# Patient Record
Sex: Female | Born: 1990 | Race: Black or African American | Hispanic: No | Marital: Married | Smoking: Never smoker
Health system: Southern US, Community
[De-identification: ages and names within clinical notes are randomized; demographics above are authoritative.]

## PROBLEM LIST (undated history)

## (undated) ENCOUNTER — Inpatient Hospital Stay (HOSPITAL_COMMUNITY): Payer: Self-pay

## (undated) DIAGNOSIS — R569 Unspecified convulsions: Secondary | ICD-10-CM

## (undated) DIAGNOSIS — Z9981 Dependence on supplemental oxygen: Secondary | ICD-10-CM

## (undated) DIAGNOSIS — C801 Malignant (primary) neoplasm, unspecified: Secondary | ICD-10-CM

## (undated) DIAGNOSIS — G40909 Epilepsy, unspecified, not intractable, without status epilepticus: Secondary | ICD-10-CM

## (undated) DIAGNOSIS — T4145XA Adverse effect of unspecified anesthetic, initial encounter: Secondary | ICD-10-CM

## (undated) DIAGNOSIS — K859 Acute pancreatitis without necrosis or infection, unspecified: Secondary | ICD-10-CM

## (undated) DIAGNOSIS — R011 Cardiac murmur, unspecified: Secondary | ICD-10-CM

## (undated) DIAGNOSIS — J45909 Unspecified asthma, uncomplicated: Secondary | ICD-10-CM

## (undated) DIAGNOSIS — O2230 Deep phlebothrombosis in pregnancy, unspecified trimester: Secondary | ICD-10-CM

## (undated) DIAGNOSIS — D573 Sickle-cell trait: Secondary | ICD-10-CM

## (undated) DIAGNOSIS — T8859XA Other complications of anesthesia, initial encounter: Secondary | ICD-10-CM

## (undated) DIAGNOSIS — G4730 Sleep apnea, unspecified: Secondary | ICD-10-CM

## (undated) DIAGNOSIS — K219 Gastro-esophageal reflux disease without esophagitis: Secondary | ICD-10-CM

## (undated) HISTORY — PX: INGUINAL HERNIA REPAIR: SUR1180

## (undated) HISTORY — PX: TONSILLECTOMY: SUR1361

## (undated) HISTORY — DX: Malignant (primary) neoplasm, unspecified: C80.1

## (undated) HISTORY — PX: APPENDECTOMY: SHX54

---
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
## 2012-09-23 ENCOUNTER — Emergency Department (HOSPITAL_COMMUNITY): Payer: Self-pay

## 2012-09-23 ENCOUNTER — Inpatient Hospital Stay: Admit: 2012-09-23 | Payer: Self-pay | Admitting: Orthopedic Surgery

## 2012-09-23 ENCOUNTER — Encounter (HOSPITAL_COMMUNITY)
Admission: EM | Disposition: A | Discharge status: Home / Self Care | Payer: Self-pay | Source: Home / Self Care | Attending: Emergency Medicine

## 2012-09-23 ENCOUNTER — Encounter (HOSPITAL_COMMUNITY): Payer: Self-pay | Admitting: Emergency Medicine

## 2012-09-23 ENCOUNTER — Emergency Department (HOSPITAL_COMMUNITY): Payer: Self-pay | Admitting: *Deleted

## 2012-09-23 ENCOUNTER — Encounter (HOSPITAL_COMMUNITY): Payer: Self-pay | Admitting: *Deleted

## 2012-09-23 ENCOUNTER — Observation Stay (HOSPITAL_COMMUNITY)
Admission: EM | Admit: 2012-09-23 | Discharge: 2012-09-24 | Disposition: A | Discharge status: Home / Self Care | Payer: Self-pay | Attending: Emergency Medicine | Admitting: Emergency Medicine

## 2012-09-23 DIAGNOSIS — S61411A Laceration without foreign body of right hand, initial encounter: Secondary | ICD-10-CM

## 2012-09-23 DIAGNOSIS — S61409A Unspecified open wound of unspecified hand, initial encounter: Principal | ICD-10-CM | POA: Insufficient documentation

## 2012-09-23 DIAGNOSIS — W268XXA Contact with other sharp object(s), not elsewhere classified, initial encounter: Secondary | ICD-10-CM | POA: Insufficient documentation

## 2012-09-23 DIAGNOSIS — IMO0002 Reserved for concepts with insufficient information to code with codable children: Secondary | ICD-10-CM | POA: Insufficient documentation

## 2012-09-23 HISTORY — DX: Unspecified asthma, uncomplicated: J45.909

## 2012-09-23 HISTORY — PX: NERVE, TENDON AND ARTERY REPAIR: SHX5695

## 2012-09-23 HISTORY — DX: Deep phlebothrombosis in pregnancy, unspecified trimester: O22.30

## 2012-09-23 LAB — CBC WITH DIFFERENTIAL/PLATELET
Basophils Relative: 1 % (ref 0–1)
Eosinophils Relative: 1 % (ref 0–5)
HCT: 39 % (ref 36.0–46.0)
Hemoglobin: 13 g/dL (ref 12.0–15.0)
Lymphocytes Relative: 41 % (ref 12–46)
MCH: 27.9 pg (ref 26.0–34.0)
Neutro Abs: 4.4 10*3/uL (ref 1.7–7.7)
Neutrophils Relative %: 50 % (ref 43–77)
RBC: 4.66 MIL/uL (ref 3.87–5.11)

## 2012-09-23 LAB — BASIC METABOLIC PANEL
BUN: 3 mg/dL — ABNORMAL LOW (ref 6–23)
CO2: 23 mEq/L (ref 19–32)
Glucose, Bld: 76 mg/dL (ref 70–99)
Potassium: 3.3 mEq/L — ABNORMAL LOW (ref 3.5–5.1)
Sodium: 138 mEq/L (ref 135–145)

## 2012-09-23 SURGERY — NERVE, TENDON AND ARTERY REPAIR
Anesthesia: General | Site: Hand | Laterality: Right | Wound class: Contaminated

## 2012-09-23 MED ORDER — ONDANSETRON HCL 4 MG/2ML IJ SOLN: 4.0000 mg | Freq: Four times a day (QID) | INTRAMUSCULAR | Status: DC | PRN

## 2012-09-23 MED ORDER — PROPOFOL 10 MG/ML IV BOLUS
INTRAVENOUS | Status: DC | PRN
  Administered 2012-09-23: 200 mg via INTRAVENOUS

## 2012-09-23 MED ORDER — HYDROMORPHONE HCL PF 1 MG/ML IJ SOLN
0.2500 mg | INTRAMUSCULAR | Status: DC | PRN
  Administered 2012-09-23 (×2): 0.5 mg via INTRAVENOUS

## 2012-09-23 MED ORDER — LIDOCAINE HCL (CARDIAC) 20 MG/ML IV SOLN
INTRAVENOUS | Status: DC | PRN
  Administered 2012-09-23: 20 mg via INTRAVENOUS

## 2012-09-23 MED ORDER — ONDANSETRON HCL 4 MG/2ML IJ SOLN
INTRAMUSCULAR | Status: DC | PRN
  Administered 2012-09-23: 4 mg via INTRAVENOUS

## 2012-09-23 MED ORDER — CEFAZOLIN SODIUM-DEXTROSE 2-3 GM-% IV SOLR
INTRAVENOUS | Status: AC
  Filled 2012-09-23: qty 50

## 2012-09-23 MED ORDER — FAMOTIDINE 20 MG PO TABS
20.0000 mg | ORAL_TABLET | Freq: Two times a day (BID) | ORAL | Status: DC | PRN
  Filled 2012-09-23: qty 1

## 2012-09-23 MED ORDER — OXYCODONE HCL 5 MG PO TABS
5.0000 mg | ORAL_TABLET | ORAL | Status: DC | PRN
  Administered 2012-09-23 – 2012-09-24 (×5): 10 mg via ORAL
  Filled 2012-09-23 (×5): qty 2

## 2012-09-23 MED ORDER — BUPIVACAINE HCL (PF) 0.25 % IJ SOLN
INTRAMUSCULAR | Status: DC | PRN
  Administered 2012-09-23: 10 mL

## 2012-09-23 MED ORDER — MEPERIDINE HCL 25 MG/ML IJ SOLN: 6.2500 mg | INTRAMUSCULAR | Status: DC | PRN

## 2012-09-23 MED ORDER — PROMETHAZINE HCL 25 MG/ML IJ SOLN: 6.2500 mg | INTRAMUSCULAR | Status: DC | PRN

## 2012-09-23 MED ORDER — LACTATED RINGERS IV SOLN
INTRAVENOUS | Status: DC | PRN
  Administered 2012-09-23 (×2): via INTRAVENOUS

## 2012-09-23 MED ORDER — TETANUS-DIPHTH-ACELL PERTUSSIS 5-2.5-18.5 LF-MCG/0.5 IM SUSP
0.5000 mL | Freq: Once | INTRAMUSCULAR | Status: AC
  Administered 2012-09-23: 0.5 mL via INTRAMUSCULAR
  Filled 2012-09-23: qty 0.5

## 2012-09-23 MED ORDER — MIDAZOLAM HCL 5 MG/5ML IJ SOLN
INTRAMUSCULAR | Status: DC | PRN
  Administered 2012-09-23: 2 mg via INTRAVENOUS

## 2012-09-23 MED ORDER — OXYCODONE HCL 5 MG/5ML PO SOLN: 5.0000 mg | Freq: Once | ORAL | Status: AC | PRN

## 2012-09-23 MED ORDER — MIDAZOLAM HCL 2 MG/2ML IJ SOLN: 0.5000 mg | Freq: Once | INTRAMUSCULAR | Status: DC | PRN

## 2012-09-23 MED ORDER — DOCUSATE SODIUM 100 MG PO CAPS
100.0000 mg | ORAL_CAPSULE | Freq: Two times a day (BID) | ORAL | Status: DC
  Administered 2012-09-23 – 2012-09-24 (×2): 100 mg via ORAL
  Filled 2012-09-23 (×2): qty 1

## 2012-09-23 MED ORDER — OXYCODONE HCL 5 MG PO TABS
5.0000 mg | ORAL_TABLET | Freq: Once | ORAL | Status: AC | PRN
  Administered 2012-09-23: 5 mg via ORAL

## 2012-09-23 MED ORDER — OXYCODONE HCL 5 MG PO TABS
ORAL_TABLET | ORAL | Status: AC
  Filled 2012-09-23: qty 1

## 2012-09-23 MED ORDER — MORPHINE SULFATE 2 MG/ML IJ SOLN
1.0000 mg | INTRAMUSCULAR | Status: DC | PRN
  Administered 2012-09-23 – 2012-09-24 (×5): 1 mg via INTRAVENOUS
  Filled 2012-09-23 (×4): qty 1

## 2012-09-23 MED ORDER — FENTANYL CITRATE 0.05 MG/ML IJ SOLN
INTRAMUSCULAR | Status: DC | PRN
  Administered 2012-09-23 (×3): 50 ug via INTRAVENOUS

## 2012-09-23 MED ORDER — CEFAZOLIN SODIUM-DEXTROSE 2-3 GM-% IV SOLR
2.0000 g | Freq: Once | INTRAVENOUS | Status: AC
  Administered 2012-09-23: 2 g via INTRAVENOUS
  Filled 2012-09-23 (×3): qty 50

## 2012-09-23 MED ORDER — OXYCODONE HCL 5 MG PO TABS
5.0000 mg | ORAL_TABLET | Freq: Once | ORAL | Status: AC
  Administered 2012-09-23: 5 mg via ORAL
  Filled 2012-09-23: qty 1

## 2012-09-23 MED ORDER — LACTATED RINGERS IV SOLN: INTRAVENOUS | Status: DC

## 2012-09-23 MED ORDER — HYDROMORPHONE HCL PF 1 MG/ML IJ SOLN
INTRAMUSCULAR | Status: AC
  Filled 2012-09-23: qty 1

## 2012-09-23 MED ORDER — PROMETHAZINE HCL 25 MG RE SUPP: 12.5000 mg | Freq: Four times a day (QID) | RECTAL | Status: DC | PRN

## 2012-09-23 MED ORDER — CEFAZOLIN SODIUM 1-5 GM-% IV SOLN
1.0000 g | Freq: Three times a day (TID) | INTRAVENOUS | Status: DC
  Administered 2012-09-24 (×2): 1 g via INTRAVENOUS
  Filled 2012-09-23 (×3): qty 50

## 2012-09-23 MED ORDER — BUPIVACAINE HCL (PF) 0.25 % IJ SOLN
INTRAMUSCULAR | Status: AC
  Filled 2012-09-23: qty 30

## 2012-09-23 MED ORDER — ONDANSETRON HCL 4 MG PO TABS: 4.0000 mg | ORAL_TABLET | Freq: Four times a day (QID) | ORAL | Status: DC | PRN

## 2012-09-23 MED ORDER — VITAMIN C 500 MG PO TABS
1000.0000 mg | ORAL_TABLET | Freq: Every day | ORAL | Status: DC
  Administered 2012-09-23 – 2012-09-24 (×2): 1000 mg via ORAL
  Filled 2012-09-23 (×2): qty 2

## 2012-09-23 MED ORDER — SUFENTANIL CITRATE 50 MCG/ML IV SOLN
INTRAVENOUS | Status: DC | PRN
  Administered 2012-09-23: 5 ug via INTRAVENOUS
  Administered 2012-09-23: 10 ug via INTRAVENOUS

## 2012-09-23 MED ORDER — ALPRAZOLAM 0.5 MG PO TABS: 0.5000 mg | ORAL_TABLET | Freq: Four times a day (QID) | ORAL | Status: DC | PRN

## 2012-09-23 MED ORDER — SODIUM CHLORIDE 0.9 % IR SOLN
Status: DC | PRN
  Administered 2012-09-23: 1

## 2012-09-23 MED ORDER — MORPHINE SULFATE 2 MG/ML IJ SOLN
INTRAMUSCULAR | Status: AC
  Filled 2012-09-23: qty 1

## 2012-09-23 SURGICAL SUPPLY — 50 items
BANDAGE ELASTIC 3 VELCRO ST LF (GAUZE/BANDAGES/DRESSINGS) IMPLANT
BANDAGE ELASTIC 4 VELCRO ST LF (GAUZE/BANDAGES/DRESSINGS) ×2 IMPLANT
BANDAGE GAUZE 4  KLING STR (GAUZE/BANDAGES/DRESSINGS) ×2 IMPLANT
BANDAGE GAUZE ELAST BULKY 4 IN (GAUZE/BANDAGES/DRESSINGS) ×2 IMPLANT
BNDG COHESIVE 1X5 TAN STRL LF (GAUZE/BANDAGES/DRESSINGS) ×2 IMPLANT
CLOTH BEACON ORANGE TIMEOUT ST (SAFETY) ×2 IMPLANT
CORDS BIPOLAR (ELECTRODE) ×2 IMPLANT
COVER SURGICAL LIGHT HANDLE (MISCELLANEOUS) ×2 IMPLANT
CUFF TOURNIQUET SINGLE 18IN (TOURNIQUET CUFF) ×2 IMPLANT
CUFF TOURNIQUET SINGLE 24IN (TOURNIQUET CUFF) IMPLANT
DECANTER SPIKE VIAL GLASS SM (MISCELLANEOUS) ×2 IMPLANT
DRAPE SURG 17X23 STRL (DRAPES) ×2 IMPLANT
GAUZE SPONGE 2X2 8PLY STRL LF (GAUZE/BANDAGES/DRESSINGS) IMPLANT
GAUZE XEROFORM 1X8 LF (GAUZE/BANDAGES/DRESSINGS) IMPLANT
GLOVE BIOGEL M STRL SZ7.5 (GLOVE) ×8 IMPLANT
GLOVE SS BIOGEL STRL SZ 8 (GLOVE) ×4 IMPLANT
GLOVE SUPERSENSE BIOGEL SZ 8 (GLOVE) ×4
GOWN PREVENTION PLUS XLARGE (GOWN DISPOSABLE) ×2 IMPLANT
GOWN STRL NON-REIN LRG LVL3 (GOWN DISPOSABLE) ×4 IMPLANT
GOWN STRL REIN XL XLG (GOWN DISPOSABLE) ×4 IMPLANT
KIT BASIN OR (CUSTOM PROCEDURE TRAY) ×2 IMPLANT
KIT ROOM TURNOVER OR (KITS) ×2 IMPLANT
LOOP VESSEL MAXI BLUE (MISCELLANEOUS) IMPLANT
MANIFOLD NEPTUNE II (INSTRUMENTS) ×2 IMPLANT
NEEDLE HYPO 25GX1X1/2 BEV (NEEDLE) IMPLANT
NS IRRIG 1000ML POUR BTL (IV SOLUTION) ×2 IMPLANT
PACK ORTHO EXTREMITY (CUSTOM PROCEDURE TRAY) ×2 IMPLANT
PAD ARMBOARD 7.5X6 YLW CONV (MISCELLANEOUS) ×4 IMPLANT
PAD CAST 3X4 CTTN HI CHSV (CAST SUPPLIES) ×1 IMPLANT
PAD CAST 4YDX4 CTTN HI CHSV (CAST SUPPLIES) ×1 IMPLANT
PADDING CAST COTTON 3X4 STRL (CAST SUPPLIES) ×1
PADDING CAST COTTON 4X4 STRL (CAST SUPPLIES) ×1
SOLUTION BETADINE 4OZ (MISCELLANEOUS) ×2 IMPLANT
SPEAR EYE SURG WECK-CEL (MISCELLANEOUS) IMPLANT
SPECIMEN JAR SMALL (MISCELLANEOUS) ×2 IMPLANT
SPONGE GAUZE 2X2 STER 10/PKG (GAUZE/BANDAGES/DRESSINGS)
SPONGE GAUZE 4X4 12PLY (GAUZE/BANDAGES/DRESSINGS) ×2 IMPLANT
SPONGE SCRUB IODOPHOR (GAUZE/BANDAGES/DRESSINGS) ×2 IMPLANT
SUCTION FRAZIER TIP 10 FR DISP (SUCTIONS) ×2 IMPLANT
SUT MERSILENE 4 0 P 3 (SUTURE) IMPLANT
SUT PROLENE 4 0 PS 2 18 (SUTURE) ×2 IMPLANT
SUT VIC AB 2-0 CT1 27 (SUTURE)
SUT VIC AB 2-0 CT1 TAPERPNT 27 (SUTURE) IMPLANT
SYR CONTROL 10ML LL (SYRINGE) IMPLANT
SYSTEM CHEST DRAIN TLS 7FR (DRAIN) ×2 IMPLANT
TOWEL OR 17X24 6PK STRL BLUE (TOWEL DISPOSABLE) ×2 IMPLANT
TOWEL OR 17X26 10 PK STRL BLUE (TOWEL DISPOSABLE) ×2 IMPLANT
TUBE CONNECTING 12X1/4 (SUCTIONS) ×2 IMPLANT
UNDERPAD 30X30 INCONTINENT (UNDERPADS AND DIAPERS) ×2 IMPLANT
WATER STERILE IRR 1000ML POUR (IV SOLUTION) ×2 IMPLANT
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
## 2012-09-23 NOTE — Transfer of Care (Signed)
Immediate Anesthesia Transfer of Care Note  Patient: Jasmine [name]  Procedure(s) Performed: Procedure(s): I&D and Repair As Necessary/Right Hand and Palm (Right)  Patient Location: PACU  Anesthesia Type:General  Level of Consciousness: awake, alert  and oriented  Airway & Oxygen Therapy: Patient Spontanous Breathing and Patient connected to nasal cannula oxygen  Post-op Assessment: Report given to PACU RN and Post -op Vital signs reviewed and stable  Post vital signs: Reviewed and stable  Complications: No apparent anesthesia complications

## 2012-09-23 NOTE — ED Notes (Signed)
OR called to bring patient.

## 2012-09-23 NOTE — ED Notes (Signed)
Pt taken to Short Stay by EMT.

## 2012-09-23 NOTE — Anesthesia Postprocedure Evaluation (Signed)
  Anesthesia Post-op Note  Patient: Jasmine [name]  Procedure(s) Performed: Procedure(s): I&D and Repair As Necessary/Right Hand and Palm (Right)  Patient Location: PACU  Anesthesia Type:General  Level of Consciousness: awake, alert , oriented and patient cooperative  Airway and Oxygen Therapy: Patient Spontanous Breathing and Patient connected to nasal cannula oxygen  Post-op Pain: moderate  Post-op Assessment: Post-op Vital signs reviewed, Patient's Cardiovascular Status Stable, Respiratory Function Stable, Patent Airway, No signs of Nausea or vomiting and Adequate PO intake  Post-op Vital Signs: Reviewed and stable  Complications: No apparent anesthesia complications

## 2012-09-23 NOTE — ED Provider Notes (Signed)
CSN: [id number]     Arrival date & time 09/23/12  1422 History  This chart was scribed for non-physician practitioner Roxy Horseman, PA-C working with Gavin Pound. Oletta Lamas, MD by Danella Maiers, ED Scribe. This patient was seen in room TR10C/TR10C and the patient's care was started at 3:00 PM.     Chief Complaint  Patient presents with  . Laceration   The history is provided by the patient. No language interpreter was used.   HPI Comments: Jasmine [name] is a 22 y.o. female who presents to the Emergency Department complaining of right palm laceration after punching a glass cabinet door 45 minutes ago. She states there was a piece of glass in her hand that she pulled out herself PTA. She states she is unable to move or feel her thumb currently. She has not tried anything to alleviate her symptoms. Her last tetanus shot was more than 5 years ago.   Past Medical History  Diagnosis Date  . Asthma   . DVT (deep vein thrombosis) in pregnancy    No past surgical history on file. No family history on file. History  Substance Use Topics  . Smoking status: Not on file  . Smokeless tobacco: Not on file  . Alcohol Use: Not on file   OB History   Grav Para Term Preterm Abortions TAB SAB Ect Mult Living                 Review of Systems  All other systems reviewed and are negative.    Allergies  Ibuprofen; Tylenol; Amoxicillin; Doxycycline; Erythromycin; Penicillins; and Prednisone  Home Medications  No current outpatient prescriptions on file. BP 105/72  Pulse 89  Temp(Src) 98.9 F (37.2 C) (Oral)  Resp 18  SpO2 100% Physical Exam  Nursing note and vitals reviewed. Constitutional: She is oriented to person, place, and time. She appears well-developed and well-nourished. No distress.  HENT:  Head: Normocephalic and atraumatic.  Eyes: EOM are normal.  Neck: Neck supple. No tracheal deviation present.  Cardiovascular: Normal rate and intact distal pulses.   Brisk cap refill.   Pulmonary/Chest: Effort normal. No respiratory distress.  Musculoskeletal: Normal range of motion.  Unable to flex and extend right thumb. Remaining finger and wrist ROM 5/5.   Neurological: She is alert and oriented to person, place, and time.  Unable to detect sharp dull sensation to right thumb. Otherwise normal.   Skin: Skin is warm and dry.  1 cm laceration to the proximal thenar eminence with no obvious retained foreign body. Bleeding is controlled.   Psychiatric: She has a normal mood and affect. Her behavior is normal.    ED Course  Procedures (including critical care time) Medications  TDaP (BOOSTRIX) injection 0.5 mL (not administered)  oxyCODONE (Oxy IR/ROXICODONE) immediate release tablet 5 mg (5 mg Oral Given 09/23/12 1533)    DIAGNOSTIC STUDIES: Oxygen Saturation is 100% on room air, normal by my interpretation.    COORDINATION OF CARE: 3:18 PM- Discussed treatment plan with pt which includes hand xray and pain meds and pt agrees to plan.    Labs Review Labs Reviewed  CBC WITH DIFFERENTIAL  BASIC METABOLIC PANEL   Imaging Review Dg Hand Complete Right  09/23/2012   *RADIOLOGY REPORT*  Clinical Data: Pain post trauma  RIGHT HAND - COMPLETE 3+ VIEW  Comparison: None.  Findings: Frontal, oblique, and lateral views were obtained.  There is no fracture or dislocation. On the oblique view, there is a small radiopaque foreign body  located between the second and third proximal metacarpals.  This finding is not seen on other views.  Joint spaces appear intact.  No erosive change.  IMPRESSION: On the oblique view, there is a small radiopaque foreign body located between the proximal second and third metacarpals measuring approximately 2 mm in length.  This finding could represent a small glass fragment given the clinical history. Note that it is seen only on the oblique view.  No bony abnormality.  No fracture or dislocation.   Original Report Authenticated By: Bretta Bang,  M.D.    MDM   1. Laceration of right palm     Patient with laceration to the palm. Unable to move her thumb. Discussed with Dr. Oletta Lamas. Will discuss with orthopedic hand surgery. Patient will be taken to surgery by Dr. Amanda Pea.      I personally performed the services described in this documentation, which was scribed in my presence. The recorded information has been reviewed and is accurate.     Roxy Horseman, PA-C 09/23/12 1750
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
## 2012-09-23 NOTE — Preoperative (Signed)
Beta Blockers   Reason not to administer Beta Blockers:Not Applicable 

## 2012-09-23 NOTE — ED Notes (Signed)
TO SHORTSTAY.

## 2012-09-23 NOTE — Anesthesia Procedure Notes (Signed)
Procedure Name: LMA Insertion Date/Time: 09/23/2012 6:33 PM Performed by: Coralee Rud Pre-anesthesia Checklist: Patient identified, Emergency Drugs available, Suction available and Patient being monitored Patient Re-evaluated:Patient Re-evaluated prior to inductionOxygen Delivery Method: Circle system utilized Preoxygenation: Pre-oxygenation with 100% oxygen Intubation Type: IV induction Ventilation: Mask ventilation without difficulty LMA: LMA inserted LMA Size: 4.0 Number of attempts: 1 Placement Confirmation: ETT inserted through vocal cords under direct vision and positive ETCO2

## 2012-09-23 NOTE — ED Notes (Signed)
IV attempt x2 without success.

## 2012-09-23 NOTE — Op Note (Signed)
See Dictation #[id number] Dominica Severin MD

## 2012-09-23 NOTE — Anesthesia Preprocedure Evaluation (Addendum)
Anesthesia Evaluation  Patient identified by MRN, date of birth, ID band Patient awake    Reviewed: Allergy & Precautions, H&P , NPO status   History of Anesthesia Complications (+) AWARENESS UNDER ANESTHESIA  Airway Mallampati: I TM Distance: >3 FB Neck ROM: Full    Dental  (+) Teeth Intact and Dental Advisory Given   Pulmonary asthma , Current Smoker,  Recent hx. of smoking 2 ppd breath sounds clear to auscultation  Pulmonary exam normal       Cardiovascular + Valvular Problems/Murmurs (transient murmur) Rhythm:Regular Rate:Normal  Murmur since childhood only flaring up when asthma flares up.   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 Well controlled now, last seizure 2012    GI/Hepatic Neg liver ROS, Pancreatitis age [age] associated with high consumption of fats and pork.   Endo/Other  negative endocrine ROS  Renal/GU negative Renal ROS     Musculoskeletal   Abdominal   Peds  Hematology  (+) Blood dyscrasia, Sickle cell trait , Sickle cell trait +   Anesthesia Other Findings   Reproductive/Obstetrics negative OB ROS LMP 09/16/12                        Anesthesia Physical Anesthesia Plan  ASA: II and emergent  Anesthesia Plan: General   Post-op Pain Management:    Induction: Intravenous  Airway Management Planned: LMA  Additional Equipment:   Intra-op Plan:   Post-operative Plan: Extubation in OR  Informed Consent: I have reviewed the patients History and Physical, chart, labs and discussed the procedure including the risks, benefits and alternatives for the proposed anesthesia with the patient or authorized representative who has indicated his/her understanding and acceptance.   Dental advisory given  Plan Discussed with: CRNA, Anesthesiologist and Surgeon  Anesthesia Plan Comments: (Plan routine monitors,  GA- LMA OK)       Anesthesia Quick Evaluation

## 2012-09-24 MED ORDER — DIPHENHYDRAMINE HCL 50 MG/ML IJ SOLN
12.5000 mg | Freq: Four times a day (QID) | INTRAMUSCULAR | Status: DC | PRN
  Administered 2012-09-24: 12.5 mg via INTRAVENOUS

## 2012-09-24 MED ORDER — DIPHENHYDRAMINE HCL 50 MG/ML IJ SOLN
INTRAMUSCULAR | Status: AC
  Administered 2012-09-24: 12.5 mg
  Filled 2012-09-24: qty 1

## 2012-09-24 MED ORDER — DIPHENHYDRAMINE HCL 12.5 MG/5ML PO ELIX
12.5000 mg | ORAL_SOLUTION | Freq: Once | ORAL | Status: AC
  Administered 2012-09-24: 12:00:00 via ORAL
  Filled 2012-09-24: qty 5

## 2012-09-24 MED ORDER — OXYCODONE HCL 5 MG PO TABS: 5.0000 mg | ORAL_TABLET | Freq: Four times a day (QID) | ORAL | Status: DC | PRN
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
## 2012-09-24 NOTE — Progress Notes (Signed)
Patient given po Benedryl when received from Pharmacy. No further difficulty.

## 2012-09-24 NOTE — Progress Notes (Signed)
DR Amanda Pea returned call and informed of events. Po Benedryl order received in addition to given Benedryl.

## 2012-09-24 NOTE — Progress Notes (Signed)
UR COMPLETED  

## 2012-09-24 NOTE — Progress Notes (Signed)
Vomited recent food. Denies further nausea. Comfort measures, coughing - O2 2L/M applied. Patient calmed. Mother at [facility]

## 2012-09-24 NOTE — Discharge Summary (Signed)
Physician Discharge Summary  Patient ID: Jasmine [name] MRN: [id number] DOB/AGE: [birth year] 21 y.o.  Admit date: 09/23/2012 Discharge date: 09/24/2012  Admission Diagnoses: Right hand thenar laceration  Inability to flex the thumb and numbness about the thumb, rule out tendon laceration, neurovascular injury Discharge Diagnoses: Status post irrigation and debridement about the right hand and thumb with expiration of nerve artery and tendon with noted to contusive injury to the radial digital nerve, no tendon disruption or frank nerve laceration   Discharged Condition: Improved  Hospital Course: The patient is a 22 year old female presented to the emergency room setting for evaluation of her right hand after she states she lacerated her hand with a sharp piece of glass, the patient states that she put on a large piece of glass from the palmar region of her hand. She was seen and evaluated emergency room setting and had inability to flex or extend the thumb in addition she describes pain numbness about the thumb. Hand surgery consultation was implemented, the patient was seen and evaluated. I should note she had inability to actively move her finger however tenodesis effect was intact she also described frank numbness of the thumb and when tested with pinprick testing had no response in terms of pain. We discussed with the patient given her objective examination it was difficult to ascertain she had a partial tendon injury and nerve injury as well and does have discussed with her proceeding with I&D and exploration patient was cleared preoperatively. The patient underwent surgical intervention in the form of irrigation and debridement as well as exploration and of nerve artery and tendon about the thumb I should note she was noted to have a contusive injury of the digital nerve however there was no frank laceration of the nerves the tendon was competent and intact, please see operative report for full  details. The patient was admitted overnight for IV antibiotics and pain control following day on postop day #1 the patient is doing fairly well with her pain medication regime. She was complaining of final dose of Ancef and sheet discussed with the nursing staff she felt as though she was short of breath and that if the swelling. She was given 0.5 mg of Benadryl with complete cessation of the symptoms rapid response team was contacted. I should note throughout this process her vitals were noted the stable her O2 sat was 100% discussed with the patient and her mother at length future reference adding Ancef to a list of possible allergy.   upon evaluation on postoperative day #1 the patient is awake her mother is in the room. Patient is very emotional but in no acute distress. Evaluation shows she has full digital range of motion still complains of dense numbness about the thumb her refill is intact no signs of infection or dystrophy present. Signs are stable she is afebrile oxygen saturation is 100%. She is bilateral chest expansion respirations are nonlabored should not angioedema she has no the care of a present about the upper extremities or truncal region. Her drain is removed without difficulty, however this time did cause a fair amount of distress to the patient. Discussed with she and her mother all issues they are eager for discharge today I discussed with him charge instructions to include keeping the upper extremity elevated moving her fingers frequently and keeping the splint clean and intact. She'll be given a sling to use only when she is up for prolonged periods of time. Will follow the patient in our office  in approximately 10-12 days for suture removal during the interim she'll work on range of motion and maintain her splint. Thisis was an uncomplicated surgery, however I do think the patient will have some difficulties during her postoperative period secondary to poor coping mechanisms. We  discussed all issues with she and her mother  Consults: None  Significant Diagnostic Studies: None  Treatments: See operative report  Discharge Exam: Blood pressure 123/70, pulse 68, temperature 98.3 F (36.8 C), temperature source Oral, resp. rate 16, height 5\' 4"  (1.626 m), weight 81.557 kg (179 lb 12.8 oz), last menstrual period 09/16/2012, SpO2 100.00%. Marland Kitchen.The patient is alert and oriented in no acute distress the patient complains of pain in the affected upper extremity.  The patient is noted to have a normal HEENT exam.  Lung fields show equal chest expansion and no shortness of breath  abdomen exam is nontender without distention.  Lower extremity examination does not show any fracture dislocation or blood clot symptoms.  Pelvis is stable neck and back are stable and nontender  evaluation the right upper extremity shows her splint is clean and intact. Digital range of motion is intact she still describes subjective numbness distal tip of the thumb, drain is removed without difficulties.  Disposition: Final discharge disposition not confirmed  Discharge Orders   Future Orders Complete By Expires   Call MD / Call 911  As directed    Comments:     If you experience chest pain or shortness of breath, CALL 911 and be transported to the hospital emergency room.  If you develope a fever above 101 F, pus (white drainage) or increased drainage or redness at the wound, or calf pain, call your surgeon's office.   Constipation Prevention  As directed    Comments:     Drink plenty of fluids.  Prune juice may be helpful.  You may use a stool softener, such as Colace (over the counter) 100 mg twice a day.  Use MiraLax (over the counter) for constipation as needed.   Diet - low sodium heart healthy  As directed    Discharge instructions  As directed    Comments:     Marland KitchenMarland KitchenKeep bandage clean and dry.  Call for any problems.  No smoking.  Criteria for driving a car: you should be off your pain  medicine for 7-8 hours, able to drive one handed(confident), thinking clearly and feeling able in your judgement to drive. Continue elevation as it will decrease swelling.  If instructed by MD move your fingers within the confines of the bandage/splint.  Use ice if instructed by your MD. Call immediately for any sudden loss of feeling in your hand/arm or change in functional abilities of the extremity.   Increase activity slowly as tolerated  As directed    Sling  As directed        Medication List         oxyCODONE 5 MG immediate release tablet  Commonly known as:  Oxy IR/ROXICODONE  Take 1 tablet (5 mg total) by mouth every 6 (six) hours as needed.           Follow-up Information   Follow up with Karen Chafe, MD. Schedule an appointment as soon as possible for a visit in 12 days. (call [phone] for questions or concerns)    Specialty:  Orthopedic Surgery   Contact information:   [address] [place] [address] [phone]       Signed: Sheran Lawless  09/24/2012, 1:18 PM

## 2012-09-24 NOTE — Progress Notes (Signed)
Dr Carlos Levering office called re: patient itching and needs Benedryl.  Message sent by office.

## 2012-09-24 NOTE — Progress Notes (Signed)
Patient SOB, mouths her throat is closing up. Benedryl 12.5 mg IV given with immediate improving.  Rapid Response Nurse called to check patient.  VSS. O2 sat 100%.  Dr Amanda Pea office called  and to send message.

## 2012-09-24 NOTE — Progress Notes (Addendum)
Patient stabilized, Rapid Response Nurse checked patient -with no further recommendations.Continue to monitor. Dr Amanda Pea office called again for return call.
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
## 2012-09-24 NOTE — Progress Notes (Signed)
Orthopedic Tech Progress Note Patient Details:  Jasmine [name] Apr 25, 1990 [id number]  Ortho Devices Type of Ortho Device: Arm sling Ortho Device/Splint Location: Kuzma sling Ortho Device/Splint Interventions: Application   [name], Jasmine Bail 09/24/2012, 11:09 AM

## 2012-09-24 NOTE — Progress Notes (Signed)
Dr Amanda Pea  office called re: sling reccommended by OT, patient wants MD called re: discharge today. Message sent by office.

## 2012-09-25 NOTE — ED Provider Notes (Signed)
Medical screening examination/treatment/procedure(s) were performed by non-physician practitioner and as supervising physician I was immediately available for consultation/collaboration.  Jaylnn Ullery Y. Fredna Stricker, MD 09/25/12 2206 

## 2012-09-26 ENCOUNTER — Encounter (HOSPITAL_COMMUNITY): Payer: Self-pay | Admitting: Orthopedic Surgery

## 2012-10-21 ENCOUNTER — Ambulatory Visit: Payer: Medicaid Other | Attending: Orthopedic Surgery | Admitting: *Deleted

## 2012-10-21 DIAGNOSIS — M6281 Muscle weakness (generalized): Secondary | ICD-10-CM | POA: Insufficient documentation

## 2012-10-21 DIAGNOSIS — R5381 Other malaise: Secondary | ICD-10-CM | POA: Insufficient documentation

## 2012-10-21 DIAGNOSIS — M79609 Pain in unspecified limb: Secondary | ICD-10-CM | POA: Insufficient documentation

## 2012-10-21 DIAGNOSIS — IMO0001 Reserved for inherently not codable concepts without codable children: Secondary | ICD-10-CM | POA: Insufficient documentation

## 2012-10-21 DIAGNOSIS — R279 Unspecified lack of coordination: Secondary | ICD-10-CM | POA: Insufficient documentation

## 2013-01-02 ENCOUNTER — Emergency Department (HOSPITAL_COMMUNITY)
Admission: EM | Admit: 2013-01-02 | Discharge: 2013-01-02 | Disposition: A | Discharge status: Home / Self Care | Payer: Medicaid Other | Attending: Emergency Medicine | Admitting: Emergency Medicine

## 2013-01-02 ENCOUNTER — Encounter (HOSPITAL_COMMUNITY): Payer: Self-pay | Admitting: Emergency Medicine

## 2013-01-02 ENCOUNTER — Emergency Department (HOSPITAL_COMMUNITY): Payer: Medicaid Other

## 2013-01-02 DIAGNOSIS — Z86718 Personal history of other venous thrombosis and embolism: Secondary | ICD-10-CM | POA: Insufficient documentation

## 2013-01-02 DIAGNOSIS — M25569 Pain in unspecified knee: Secondary | ICD-10-CM | POA: Insufficient documentation

## 2013-01-02 DIAGNOSIS — J45909 Unspecified asthma, uncomplicated: Secondary | ICD-10-CM | POA: Insufficient documentation

## 2013-01-02 DIAGNOSIS — Z881 Allergy status to other antibiotic agents status: Secondary | ICD-10-CM | POA: Insufficient documentation

## 2013-01-02 DIAGNOSIS — Z888 Allergy status to other drugs, medicaments and biological substances status: Secondary | ICD-10-CM | POA: Insufficient documentation

## 2013-01-02 DIAGNOSIS — Z880 Allergy status to penicillin: Secondary | ICD-10-CM | POA: Insufficient documentation

## 2013-01-02 DIAGNOSIS — M25469 Effusion, unspecified knee: Secondary | ICD-10-CM | POA: Insufficient documentation

## 2013-01-02 DIAGNOSIS — Z79899 Other long term (current) drug therapy: Secondary | ICD-10-CM | POA: Insufficient documentation

## 2013-01-02 DIAGNOSIS — G8929 Other chronic pain: Secondary | ICD-10-CM

## 2013-01-02 MED ORDER — OXYCODONE HCL 5 MG PO TABS: 5.0000 mg | ORAL_TABLET | Freq: Four times a day (QID) | ORAL | Status: DC | PRN

## 2013-01-02 NOTE — ED Notes (Signed)
Ortho tech notified to apply knee sleeve

## 2013-01-02 NOTE — ED Notes (Signed)
Per ortho tech, pt immediately left as soon as he finished apply brace. Stated pt refused to wait for discharge instructions. Pt ambulatory out of room, in NAD. Unable to get last set of vitals due to pt refusal.

## 2013-01-02 NOTE — ED Notes (Signed)
Humes, PA at bedside.  
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
## 2013-01-02 NOTE — Progress Notes (Signed)
Orthopedic Tech Progress Note Patient Details:  Jasmine [name] 01-Apr-1990 [id number]  Ortho Devices Type of Ortho Device: Knee Sleeve Ortho Device/Splint Location: LLE Ortho Device/Splint Interventions: Ordered;Application   Jennye Moccasin 01/02/2013, 9:48 PM
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
## 2013-01-02 NOTE — ED Notes (Signed)
Pt's family has come out of the room, inquiring how long until the pt will be discharged. Lanora Manis, RN is aware, and will speak to the PA.

## 2013-01-07 NOTE — ED Provider Notes (Signed)
Medical screening examination/treatment/procedure(s) were performed by non-physician practitioner and as supervising physician I was immediately available for consultation/collaboration.  EKG Interpretation   None         Angelee Bahr W. Drake Wuertz, MD 01/07/13 1411 

## 2013-01-19 ENCOUNTER — Emergency Department (HOSPITAL_COMMUNITY): Payer: Medicaid Other

## 2013-01-19 ENCOUNTER — Emergency Department (HOSPITAL_COMMUNITY)
Admission: EM | Admit: 2013-01-19 | Discharge: 2013-01-19 | Disposition: A | Discharge status: Home / Self Care | Payer: Medicaid Other | Source: Home / Self Care

## 2013-01-19 ENCOUNTER — Encounter (HOSPITAL_COMMUNITY): Payer: Self-pay | Admitting: Emergency Medicine

## 2013-01-19 ENCOUNTER — Inpatient Hospital Stay (HOSPITAL_COMMUNITY)
Admission: EM | Admit: 2013-01-19 | Discharge: 2013-01-22 | DRG: 203 | Discharge status: Against Medical Advice | Payer: Medicaid Other | Attending: Internal Medicine | Admitting: Internal Medicine

## 2013-01-19 ENCOUNTER — Encounter (HOSPITAL_COMMUNITY): Payer: Self-pay | Admitting: Internal Medicine

## 2013-01-19 DIAGNOSIS — Z91010 Allergy to peanuts: Secondary | ICD-10-CM

## 2013-01-19 DIAGNOSIS — D649 Anemia, unspecified: Secondary | ICD-10-CM

## 2013-01-19 DIAGNOSIS — D72829 Elevated white blood cell count, unspecified: Secondary | ICD-10-CM | POA: Diagnosis present

## 2013-01-19 DIAGNOSIS — Z9119 Patient's noncompliance with other medical treatment and regimen: Secondary | ICD-10-CM

## 2013-01-19 DIAGNOSIS — D509 Iron deficiency anemia, unspecified: Secondary | ICD-10-CM | POA: Diagnosis present

## 2013-01-19 DIAGNOSIS — Z880 Allergy status to penicillin: Secondary | ICD-10-CM

## 2013-01-19 DIAGNOSIS — Z91199 Patient's noncompliance with other medical treatment and regimen due to unspecified reason: Secondary | ICD-10-CM

## 2013-01-19 DIAGNOSIS — D573 Sickle-cell trait: Secondary | ICD-10-CM | POA: Diagnosis present

## 2013-01-19 DIAGNOSIS — G40909 Epilepsy, unspecified, not intractable, without status epilepticus: Secondary | ICD-10-CM | POA: Diagnosis present

## 2013-01-19 DIAGNOSIS — Z79899 Other long term (current) drug therapy: Secondary | ICD-10-CM

## 2013-01-19 DIAGNOSIS — J45901 Unspecified asthma with (acute) exacerbation: Principal | ICD-10-CM

## 2013-01-19 HISTORY — DX: Sickle-cell trait: D57.3

## 2013-01-19 HISTORY — DX: Unspecified convulsions: R56.9

## 2013-01-19 LAB — CBC WITH DIFFERENTIAL/PLATELET
BASOS PCT: 0 % (ref 0–1)
Basophils Absolute: 0 10*3/uL (ref 0.0–0.1)
EOS ABS: 0.3 10*3/uL (ref 0.0–0.7)
Eosinophils Relative: 2 % (ref 0–5)
HCT: 35 % — ABNORMAL LOW (ref 36.0–46.0)
HEMOGLOBIN: 11.9 g/dL — AB (ref 12.0–15.0)
LYMPHS PCT: 22 % (ref 12–46)
Lymphs Abs: 3.3 10*3/uL (ref 0.7–4.0)
MCH: 28.3 pg (ref 26.0–34.0)
MCHC: 34 g/dL (ref 30.0–36.0)
MCV: 83.1 fL (ref 78.0–100.0)
Monocytes Absolute: 1 10*3/uL (ref 0.1–1.0)
Monocytes Relative: 7 % (ref 3–12)
NEUTROS PCT: 69 % (ref 43–77)
Neutro Abs: 10.2 10*3/uL — ABNORMAL HIGH (ref 1.7–7.7)
Platelets: 266 10*3/uL (ref 150–400)
RBC: 4.21 MIL/uL (ref 3.87–5.11)
RDW: 14.5 % (ref 11.5–15.5)
WBC: 14.8 10*3/uL — ABNORMAL HIGH (ref 4.0–10.5)

## 2013-01-19 LAB — BASIC METABOLIC PANEL
BUN: 5 mg/dL — ABNORMAL LOW (ref 6–23)
CO2: 26 mEq/L (ref 19–32)
Calcium: 9.3 mg/dL (ref 8.4–10.5)
Chloride: 103 mEq/L (ref 96–112)
Creatinine, Ser: 0.78 mg/dL (ref 0.50–1.10)
GLUCOSE: 106 mg/dL — AB (ref 70–99)
POTASSIUM: 3.6 meq/L — AB (ref 3.7–5.3)
SODIUM: 141 meq/L (ref 137–147)

## 2013-01-19 LAB — D-DIMER, QUANTITATIVE (NOT AT ARMC): D DIMER QUANT: 1.22 ug{FEU}/mL — AB (ref 0.00–0.48)

## 2013-01-19 LAB — MRSA PCR SCREENING: MRSA by PCR: NEGATIVE

## 2013-01-19 LAB — PREGNANCY, URINE: Preg Test, Ur: NEGATIVE

## 2013-01-19 MED ORDER — CARBAMAZEPINE ER 200 MG PO TB12
200.0000 mg | ORAL_TABLET | Freq: Three times a day (TID) | ORAL | Status: DC
  Administered 2013-01-19 – 2013-01-21 (×9): 200 mg via ORAL
  Filled 2013-01-19 (×12): qty 1

## 2013-01-19 MED ORDER — KETOROLAC TROMETHAMINE 30 MG/ML IJ SOLN
30.0000 mg | Freq: Once | INTRAMUSCULAR | Status: AC
Start: 2013-01-19 — End: 2013-01-19
  Administered 2013-01-19: 30 mg via INTRAVENOUS
  Filled 2013-01-19: qty 1

## 2013-01-19 MED ORDER — ONDANSETRON HCL 4 MG PO TABS: 4.0000 mg | ORAL_TABLET | Freq: Four times a day (QID) | ORAL | Status: DC | PRN

## 2013-01-19 MED ORDER — ACETAMINOPHEN 325 MG PO TABS: 650.0000 mg | ORAL_TABLET | Freq: Four times a day (QID) | ORAL | Status: DC | PRN

## 2013-01-19 MED ORDER — ALBUTEROL (5 MG/ML) CONTINUOUS INHALATION SOLN
15.0000 mg/h | INHALATION_SOLUTION | Freq: Once | RESPIRATORY_TRACT | Status: AC
  Administered 2013-01-19: 15 mg/h via RESPIRATORY_TRACT

## 2013-01-19 MED ORDER — ALBUTEROL SULFATE (2.5 MG/3ML) 0.083% IN NEBU: 2.5000 mg | INHALATION_SOLUTION | Freq: Four times a day (QID) | RESPIRATORY_TRACT | Status: DC

## 2013-01-19 MED ORDER — IPRATROPIUM BROMIDE 0.02 % IN SOLN: 0.5000 mg | Freq: Four times a day (QID) | RESPIRATORY_TRACT | Status: DC

## 2013-01-19 MED ORDER — ENOXAPARIN SODIUM 40 MG/0.4ML ~~LOC~~ SOLN
40.0000 mg | SUBCUTANEOUS | Status: DC
  Administered 2013-01-19 – 2013-01-22 (×4): 40 mg via SUBCUTANEOUS
  Filled 2013-01-19 (×5): qty 0.4

## 2013-01-19 MED ORDER — ALBUTEROL SULFATE (2.5 MG/3ML) 0.083% IN NEBU
2.5000 mg | INHALATION_SOLUTION | RESPIRATORY_TRACT | Status: DC | PRN
  Administered 2013-01-20: 2.5 mg via RESPIRATORY_TRACT

## 2013-01-19 MED ORDER — SODIUM CHLORIDE 0.9 % IJ SOLN: 3.0000 mL | Freq: Two times a day (BID) | INTRAMUSCULAR | Status: DC

## 2013-01-19 MED ORDER — PANTOPRAZOLE SODIUM 40 MG PO TBEC
40.0000 mg | DELAYED_RELEASE_TABLET | Freq: Every day | ORAL | Status: DC
  Administered 2013-01-19 – 2013-01-21 (×3): 40 mg via ORAL
  Filled 2013-01-19 (×4): qty 1

## 2013-01-19 MED ORDER — METHYLPREDNISOLONE SODIUM SUCC 125 MG IJ SOLR
80.0000 mg | Freq: Once | INTRAMUSCULAR | Status: AC
  Administered 2013-01-19: 80 mg via INTRAVENOUS
  Filled 2013-01-19: qty 2

## 2013-01-19 MED ORDER — IPRATROPIUM-ALBUTEROL 0.5-2.5 (3) MG/3ML IN SOLN
3.0000 mL | Freq: Four times a day (QID) | RESPIRATORY_TRACT | Status: DC
  Administered 2013-01-19 – 2013-01-20 (×5): 3 mL via RESPIRATORY_TRACT
  Filled 2013-01-19 (×7): qty 3

## 2013-01-19 MED ORDER — ALBUTEROL (5 MG/ML) CONTINUOUS INHALATION SOLN: 15.0000 mg/h | INHALATION_SOLUTION | Freq: Once | RESPIRATORY_TRACT | Status: DC

## 2013-01-19 MED ORDER — SODIUM CHLORIDE 0.9 % IV SOLN: 250.0000 mL | INTRAVENOUS | Status: DC | PRN

## 2013-01-19 MED ORDER — METHYLPREDNISOLONE SODIUM SUCC 125 MG IJ SOLR
60.0000 mg | Freq: Four times a day (QID) | INTRAMUSCULAR | Status: DC
  Administered 2013-01-19 – 2013-01-20 (×6): 60 mg via INTRAVENOUS
  Filled 2013-01-19 (×7): qty 0.96
  Filled 2013-01-19: qty 2
  Filled 2013-01-19: qty 0.96

## 2013-01-19 MED ORDER — SODIUM CHLORIDE 0.9 % IJ SOLN
3.0000 mL | Freq: Two times a day (BID) | INTRAMUSCULAR | Status: DC
  Administered 2013-01-19 (×2): 3 mL via INTRAVENOUS

## 2013-01-19 MED ORDER — DOCUSATE SODIUM 100 MG PO CAPS
100.0000 mg | ORAL_CAPSULE | Freq: Two times a day (BID) | ORAL | Status: DC
  Administered 2013-01-19 – 2013-01-21 (×5): 100 mg via ORAL
  Filled 2013-01-19 (×8): qty 1

## 2013-01-19 MED ORDER — ACETAMINOPHEN 325 MG PO TABS
650.0000 mg | ORAL_TABLET | Freq: Once | ORAL | Status: AC
  Administered 2013-01-19: 650 mg via ORAL

## 2013-01-19 MED ORDER — DIPHENHYDRAMINE HCL 25 MG PO CAPS
25.0000 mg | ORAL_CAPSULE | Freq: Once | ORAL | Status: AC
  Administered 2013-01-19: 25 mg via ORAL
  Filled 2013-01-19: qty 1

## 2013-01-19 MED ORDER — MAGNESIUM SULFATE 40 MG/ML IJ SOLN
2.0000 g | Freq: Once | INTRAMUSCULAR | Status: AC
  Administered 2013-01-19: 2 g via INTRAVENOUS
  Filled 2013-01-19: qty 50

## 2013-01-19 MED ORDER — MOMETASONE FURO-FORMOTEROL FUM 200-5 MCG/ACT IN AERO
2.0000 | INHALATION_SPRAY | Freq: Two times a day (BID) | RESPIRATORY_TRACT | Status: DC
  Administered 2013-01-19 – 2013-01-21 (×5): 2 via RESPIRATORY_TRACT
  Filled 2013-01-19: qty 8.8

## 2013-01-19 MED ORDER — LEVOFLOXACIN IN D5W 750 MG/150ML IV SOLN
750.0000 mg | INTRAVENOUS | Status: DC
  Administered 2013-01-19 – 2013-01-22 (×4): 750 mg via INTRAVENOUS
  Filled 2013-01-19 (×4): qty 150

## 2013-01-19 MED ORDER — GUAIFENESIN ER 600 MG PO TB12
600.0000 mg | ORAL_TABLET | Freq: Two times a day (BID) | ORAL | Status: DC
  Administered 2013-01-19 – 2013-01-21 (×6): 600 mg via ORAL
  Filled 2013-01-19 (×8): qty 1

## 2013-01-19 MED ORDER — SODIUM CHLORIDE 0.9 % IJ SOLN: 3.0000 mL | INTRAMUSCULAR | Status: DC | PRN

## 2013-01-19 MED ORDER — IPRATROPIUM BROMIDE 0.02 % IN SOLN
0.5000 mg | Freq: Once | RESPIRATORY_TRACT | Status: AC
  Administered 2013-01-19: 0.5 mg via RESPIRATORY_TRACT

## 2013-01-19 MED ORDER — ACETAMINOPHEN 325 MG PO TABS
ORAL_TABLET | ORAL | Status: AC
  Filled 2013-01-19: qty 2

## 2013-01-19 MED ORDER — ONDANSETRON HCL 4 MG/2ML IJ SOLN: 4.0000 mg | Freq: Four times a day (QID) | INTRAMUSCULAR | Status: DC | PRN

## 2013-01-19 MED ORDER — MORPHINE SULFATE 2 MG/ML IJ SOLN
1.0000 mg | INTRAMUSCULAR | Status: DC | PRN
  Administered 2013-01-19 – 2013-01-22 (×12): 2 mg via INTRAVENOUS
  Filled 2013-01-19 (×12): qty 1

## 2013-01-19 MED ORDER — IPRATROPIUM-ALBUTEROL 0.5-2.5 (3) MG/3ML IN SOLN
3.0000 mL | Freq: Once | RESPIRATORY_TRACT | Status: AC
  Administered 2013-01-19: 3 mL via RESPIRATORY_TRACT
  Filled 2013-01-19: qty 3

## 2013-01-19 NOTE — ED Provider Notes (Signed)
CSN: [id number]     Arrival date & time 01/19/13  0120 History   First MD Initiated Contact with Patient 01/19/13 0131     Chief Complaint  Patient presents with  . Asthma   (Consider location/radiation/quality/duration/timing/severity/associated sxs/prior Treatment) HPI  This a 23 year old female with a history of asthma who presents with shortness of breath.  Patient reports 2-3 days of worsening shortness of breath. She denies cough or fever. Patient is noncompliant with her albuterol at home. She has had multiple asthma exacerbations in the past requiring hospitalization and intubation. Last hospitalization was in July in [place]. Patient is also endorsing right upper extremity and right neck pain. She denies any injury. She denies any numbness or tingling in that extremity.  Past Medical History  Diagnosis Date  . Asthma   . DVT (deep vein thrombosis) in pregnancy   . Seizures    Past Surgical History  Procedure Laterality Date  . Nerve, tendon and artery repair Right 09/23/2012    Procedure: I&D and Repair As Necessary/Right Hand and Palm;  Surgeon: Roseanne Kaufman, MD;  Location: [facility];  Service: Orthopedics;  Laterality: Right;   No family history on file. History  Substance Use Topics  . Smoking status: Never Smoker   . Smokeless tobacco: Not on file  . Alcohol Use: No   OB History   Grav Para Term Preterm Abortions TAB SAB Ect Mult Living                 Review of Systems  Constitutional: Negative for fever.  Respiratory: Positive for shortness of breath and wheezing. Negative for cough and chest tightness.   Cardiovascular: Negative for chest pain.  Gastrointestinal: Negative for nausea, vomiting and abdominal pain.  Genitourinary: Negative for dysuria.  Musculoskeletal: Negative for back pain.  Skin: Negative for wound.  Neurological: Negative for headaches.  Psychiatric/Behavioral: Negative for confusion.  All other systems reviewed and are  negative.    Allergies  Ibuprofen; Prednisone; Tylenol; Amoxicillin; Doxycycline; Erythromycin; Ivp dye; Peanuts; and Penicillins  Home Medications   Current Outpatient Rx  Name  Route  Sig  Dispense  Refill  . naproxen sodium (ANAPROX) 220 MG tablet   Oral   Take 220 mg by mouth 2 (two) times daily with a meal.          SpO2 100%  LMP 01/03/2013 Physical Exam  Nursing note and vitals reviewed. Constitutional: She is oriented to person, place, and time. She appears well-developed and well-nourished.  HENT:  Head: Normocephalic and atraumatic.  Eyes: Pupils are equal, round, and reactive to light.  Neck: Neck supple.  Cardiovascular: Regular rhythm and normal heart sounds.   Tachycardia  Pulmonary/Chest: Effort normal. No respiratory distress. She has wheezes.  Tight, poor air movement  Abdominal: Soft. Bowel sounds are normal. There is no tenderness. There is no rebound.  Musculoskeletal: She exhibits no edema.  Neurological: She is alert and oriented to person, place, and time.  Skin: Skin is warm and dry.  Psychiatric: She has a normal mood and affect.    ED Course  Procedures (including critical care time) Labs Review Labs Reviewed  CBC WITH DIFFERENTIAL - Abnormal; Notable for the following:    WBC 14.8 (*)    Hemoglobin 11.9 (*)    HCT 35.0 (*)    Neutro Abs 10.2 (*)    All other components within normal limits  BASIC METABOLIC PANEL - Abnormal; Notable for the following:    Potassium 3.6 (*)  Glucose, Bld 106 (*)    BUN 5 (*)    All other components within normal limits   Imaging Review Dg Chest Portable 1 View  01/19/2013   CLINICAL DATA:  Asthma, right chest pain.  EXAM: PORTABLE CHEST - 1 VIEW  COMPARISON:  None available for comparison at time of study interpretation.  FINDINGS: Cardiomediastinal silhouette is unremarkable for this low inspiratory portable examination with crowded vasculature markings. The lungs are clear without pleural effusions or  focal consolidations. Trachea projects midline and there is no pneumothorax. Included soft tissue planes and osseous structures are non-suspicious. Multiple EKG lines overlie the patient and may obscure subtle underlying pathology.  IMPRESSION: No acute cardiopulmonary process.   Electronically Signed   By: Elon Alas   On: 01/19/2013 04:25    EKG Interpretation    Date/Time:  Monday January 19 2013 01:32:15 EST Ventricular Rate:  89 PR Interval:  129 QRS Duration: 95 QT Interval:  370 QTC Calculation: 450 R Axis:   31 Text Interpretation:  Sinus rhythm Confirmed by Amalio Loe  MD, Aurther Harlin ([id number]) on 01/19/2013 3:45:55 AM            MDM   1. Asthma exacerbation    This a 23 year old female with a history of asthma and multiple hospitalizations who presents with shortness of breath and wheezing. She is 100% on room air and able to give me a history. She is not in any obvious respiratory distress. Pulmonary exam is notable for expiratory squeaks and the patient is tight. Patient was given Solu-Medrol and magnesium. She was given a continuous DuoNeb. On recheck, she continues to have poor air movement with wheezing.  She is noted to have a leukocytosis to 14.8. Chest x-ray is negative for acute pneumonia. Given patient's history of severe asthma exacerbations and continued wheezing on exam, will admit for further management.    Merryl Hacker, MD 01/19/13 [phone]

## 2013-01-19 NOTE — Progress Notes (Addendum)
[facility] PROGRESS NOTE    Jasmine [name] PZW:[id number] DOB: Mar 08, 1990 DOA: 01/19/2013 PCP: No PCP Per Patient  HPI/Brief narrative 23 year old female patient with history of asthma, multiple intubations in the past-last in August 2014 , seizure disorder, sickle cell trait, admitted with complaints of productive cough, right-sided pleuritic chest pain and asthma exacerbation. She moved to [facility] recently and does not have PCP.   Assessment/Plan:  Acute asthma exacerbation - Prior history of VDRF- last 08/2012 - Admitted to step down unit for close monitoring in case she were to deteriorate and require ventilatory support. - Continue oxygen, Bronchodilator nebulization's and IV Solu-Medrol. Continue levofloxacin. - Will need PCP/Pulmonology for OP follow up.  Anemia/Leukocytosis - Anemia likely chronic. Follow CBC. - Leukocytosis: probably stress response.  History of Seizure - Last seizure in 08/2012. Continue home Tegratol  History of Sickle cell Trait   Code Status: Full Family Communication: None Disposition Plan: Home when medically stable   Consultants:  None  Procedures:  None  Antibiotics:  Levaquin  Subjective: Feels better. Dyspnea and right-sided pleuritic chest pain have improved. Gives history of cough with yellow sputum but no fever or chills. No sickly contacts No history of recent long-distance travel.  Objective: Filed Vitals:   01/19/13 0400 01/19/13 0451 01/19/13 0758 01/19/13 0834  BP: 135/31  114/50 144/72  Pulse: 107  100 95  Temp:    98 F (36.7 C)  TempSrc:    Oral  Resp: 20  20 17   Height:    5\' 4"  (1.626 m)  Weight:    69.9 kg (154 lb 1.6 oz)  SpO2: 99% 100% 100% 100%   No intake or output data in the 24 hours ending 01/19/13 0857 Filed Weights   01/19/13 0834  Weight: 69.9 kg (154 lb 1.6 oz)     Exam:  General exam: Young female lying comfortably in bed. Respiratory system: Reduced breath sounds bilaterally  with scattered few bilateral medium pitched expiratory rhonchi. No increased work of breathing. Able to speak in full sentences. Cardiovascular system: S1 & S2 heard, RRR. No JVD, murmurs, gallops, clicks or pedal edema. Telemetry: Sinus rhythm Gastrointestinal system: Abdomen is nondistended, soft and nontender. Normal bowel sounds heard. Central nervous system: Alert and oriented. No focal neurological deficits. Extremities: Symmetric 5 x 5 power.   Data Reviewed: Basic Metabolic Panel:  Recent Labs Lab 01/19/13 0156  NA 141  K 3.6*  CL 103  CO2 26  GLUCOSE 106*  BUN 5*  CREATININE 0.78  CALCIUM 9.3   Liver Function Tests: No results found for this basename: AST, ALT, ALKPHOS, BILITOT, PROT, ALBUMIN,  in the last 168 hours No results found for this basename: LIPASE, AMYLASE,  in the last 168 hours No results found for this basename: AMMONIA,  in the last 168 hours CBC:  Recent Labs Lab 01/19/13 0156  WBC 14.8*  NEUTROABS 10.2*  HGB 11.9*  HCT 35.0*  MCV 83.1  PLT 266   Cardiac Enzymes: No results found for this basename: CKTOTAL, CKMB, CKMBINDEX, TROPONINI,  in the last 168 hours BNP (last 3 results) No results found for this basename: PROBNP,  in the last 8760 hours CBG: No results found for this basename: GLUCAP,  in the last 168 hours  No results found for this or any previous visit (from the past 240 hour(s)).     Studies: Dg Chest Portable 1 View  01/19/2013   CLINICAL DATA:  Asthma, right chest pain.  EXAM: PORTABLE CHEST - 1  VIEW  COMPARISON:  None available for comparison at time of study interpretation.  FINDINGS: Cardiomediastinal silhouette is unremarkable for this low inspiratory portable examination with crowded vasculature markings. The lungs are clear without pleural effusions or focal consolidations. Trachea projects midline and there is no pneumothorax. Included soft tissue planes and osseous structures are non-suspicious. Multiple EKG lines  overlie the patient and may obscure subtle underlying pathology.  IMPRESSION: No acute cardiopulmonary process.   Electronically Signed   By: Elon Alas   On: 01/19/2013 04:25        Scheduled Meds: Continuous Infusions:  Active Problems:   Asthma exacerbation   Seizure disorder   Anemia   Leukocytosis, unspecified    Time spent: 25 minutes    Murl Zogg, MD, FACP, FHM. [facility] Pager [phone]  If 7PM-7AM, please contact night-coverage [email or web address] Password [email or web address] 01/19/2013, 8:57 AM    LOS: 0 days

## 2013-01-19 NOTE — ED Notes (Signed)
Pt c/o shortness of breath onset yesterday, worse today.  Has had several treatment today  PT speaking in full sentences.

## 2013-01-19 NOTE — H&P (Signed)
PCP:  No PCP Per Patient    Chief Complaint:  Shortness of breath  HPI: Jasmine [name] is a 23 y.o. female   has a past medical history of Asthma; Seizures; and Sickle cell trait.   Presented with  Patient has hx of severe asthma with hx of multiple hospitalizations and intubations x3. For the past few days patient have not been doing well. She have had pleuritic chest pain on the right of her chest for the past 3 days. And for the past day she had severe wheezing and shortness of breath.  Denies any fever or chills. She reports some right chest wall pain which is worse with movement, cough or deep breathing and radiates to right shoulder. It hurts to move right shoulder. Patient continues to have diminished air movement despite continuous nebs which point hospitalist was called for admission. Note patient appears to be T4 and answers questions quiet voice. History may be provided by the mother.  Review of Systems:    Pertinent positives include:  shortness of breath at rest.   dyspnea on exertion, wheezing  Constitutional:  No weight loss, night sweats, Fevers, chills, fatigue, weight loss  HEENT:  No headaches, Difficulty swallowing,Tooth/dental problems,Sore throat,  No sneezing, itching, ear ache, nasal congestion, post nasal drip,  Cardio-vascular:  No chest pain, Orthopnea, PND, anasarca, dizziness, palpitations.no Bilateral lower extremity swelling  GI:  No heartburn, indigestion, abdominal pain, nausea, vomiting, diarrhea, change in bowel habits, loss of appetite, melena, blood in stool, hematemesis Resp:  no No excess mucus, no productive cough, No non-productive cough, No coughing up of blood.No change in color of mucus.No wheezing. Skin:  no rash or lesions. No jaundice GU:  no dysuria, change in color of urine, no urgency or frequency. No straining to urinate.  No flank pain.  Musculoskeletal:  No joint pain or no joint swelling. No decreased range of motion. No back  pain.  Psych:  No change in mood or affect. No depression or anxiety. No memory loss.  Neuro: no localizing neurological complaints, no tingling, no weakness, no double vision, no gait abnormality, no slurred speech, no confusion  Otherwise ROS are negative except for above, 10 systems were reviewed  Past Medical History: Past Medical History  Diagnosis Date  . Asthma   . Seizures   . Sickle cell trait    Past Surgical History  Procedure Laterality Date  . Nerve, tendon and artery repair Right 09/23/2012    Procedure: I&D and Repair As Necessary/Right Hand and Palm;  Surgeon: Roseanne Kaufman, MD;  Location: [facility];  Service: Orthopedics;  Laterality: Right;  . Tonsillectomy    . Hernia repair    . Cesarean section    . Hand surgery    . Appendectomy       Medications: Prior to Admission medications   Medication Sig Start Date End Date Taking? Authorizing Provider  albuterol (PROVENTIL HFA;VENTOLIN HFA) 108 (90 BASE) MCG/ACT inhaler Inhale 2 puffs into the lungs every 4 (four) hours as needed for wheezing or shortness of breath.   Yes Historical Provider, MD  albuterol (PROVENTIL) (5 MG/ML) 0.5% nebulizer solution Take 2.5 mg by nebulization every 6 (six) hours as needed for wheezing or shortness of breath.   Yes Historical Provider, MD  Fluticasone-Salmeterol (ADVAIR) 500-50 MCG/DOSE AEPB Inhale 1 puff into the lungs 2 (two) times daily.   Yes Historical Provider, MD  naproxen sodium (ANAPROX) 220 MG tablet Take 220 mg by mouth 2 (two) times daily with  a meal.   Yes Historical Provider, MD    Allergies:   Allergies  Allergen Reactions  . Ibuprofen Hives and Shortness Of Breath  . Prednisone Hives  . Tylenol [Acetaminophen] Hives and Shortness Of Breath  . Amoxicillin Hives  . Doxycycline Hives  . Erythromycin Hives  . Ivp Dye [Iodinated Diagnostic Agents]     Shortness of breath   . Latex     Swelling/anaphylaxis  . Peanuts [Peanut Oil]   . Penicillins Hives    Social  History:  Ambulatory   independently   Lives at  Home with family   reports that she has never smoked. She does not have any smokeless tobacco history on file. She reports that she does not drink alcohol or use illicit drugs.   Family History: family history is not on file.    Physical Exam: Patient Vitals for the past 24 hrs:  SpO2  01/19/13 0451 100 %  01/19/13 0156 100 %    1. General:  in No Acute distress 2. Psychological: Alert and  Oriented 3. Head/ENT:   Moist   Mucous Membranes                          Head Non traumatic, neck supple                          Normal  Dentition 4. SKIN: normal   Skin turgor,  Skin clean Dry and intact no rash 5. Heart: Regular rate and rhythm no Murmur, Rub or gallop 6. Lungs: extensive wheezes notes with occasional squeecking sounds  7. Abdomen: Soft, non-tender, Non distended 8. Lower extremities: no clubbing, cyanosis, or edema 9. Neurologically Grossly intact, moving all 4 extremities equally 10. MSK: Normal range of motion  body mass index is unknown because there is no weight on file.   Labs on Admission:   Recent Labs  01/19/13 0156  NA 141  K 3.6*  CL 103  CO2 26  GLUCOSE 106*  BUN 5*  CREATININE 0.78  CALCIUM 9.3   No results found for this basename: AST, ALT, ALKPHOS, BILITOT, PROT, ALBUMIN,  in the last 72 hours No results found for this basename: LIPASE, AMYLASE,  in the last 72 hours  Recent Labs  01/19/13 0156  WBC 14.8*  NEUTROABS 10.2*  HGB 11.9*  HCT 35.0*  MCV 83.1  PLT 266   No results found for this basename: CKTOTAL, CKMB, CKMBINDEX, TROPONINI,  in the last 72 hours No results found for this basename: TSH, T4TOTAL, FREET3, T3FREE, THYROIDAB,  in the last 72 hours No results found for this basename: VITAMINB12, FOLATE, FERRITIN, TIBC, IRON, RETICCTPCT,  in the last 72 hours No results found for this basename: HGBA1C    The CrCl is unknown because both a height and weight (above a minimum  accepted value) are required for this calculation. ABG No results found for this basename: phart, pco2, po2, hco3, tco2, acidbasedef, o2sat     No results found for this basename: DDIMER     Other results:  I have pearsonaly reviewed this: ECG REPORT  Rate: 89  Rhythm:  SR ST&T Change:    Cultures: No results found for this basename: sdes, specrequest, cult, reptstatus       Radiological Exams on Admission: Dg Chest Portable 1 View  01/19/2013   CLINICAL DATA:  Asthma, right chest pain.  EXAM: PORTABLE CHEST - 1  VIEW  COMPARISON:  None available for comparison at time of study interpretation.  FINDINGS: Cardiomediastinal silhouette is unremarkable for this low inspiratory portable examination with crowded vasculature markings. The lungs are clear without pleural effusions or focal consolidations. Trachea projects midline and there is no pneumothorax. Included soft tissue planes and osseous structures are non-suspicious. Multiple EKG lines overlie the patient and may obscure subtle underlying pathology.  IMPRESSION: No acute cardiopulmonary process.   Electronically Signed   By: Elon Alas   On: 01/19/2013 04:25    Chart has been reviewed  Assessment/Plan  23 year old female with history of sever asthma history of intubations in the past presents with asthma exacerbate  Present on Admission:  . Asthma exacerbation - given severity of symptoms and history of multiple intubations admit to step down. Solu-Medrol taper, good air, albuterol nebulizer treatments. Will discuss her with pulmonology she would likely benefit from followup in the clinic.  Hx of seizure disorder - patient not compliant with tegretol, unclear hx will need to establish follow up after discharge with neurology Prophylaxis:   Lovenox, Protonix  CODE STATUS: FULL CODE  Other plan as per orders.  I have spent a total of 55 min on this admission  Charlyne Robertshaw 01/19/2013, 5:07 AM

## 2013-01-19 NOTE — Progress Notes (Signed)
CARE MANAGEMENT NOTE 01/19/2013  Patient:  Jasmine [name], Jasmine [name]   Account Number:  [id number]  Date Initiated:  01/19/2013  Documentation initiated by:  DAVIS,RHONDA  Subjective/Objective Assessment:   patient with hx of resp failure due to status asthmaticus/ has failed her normal outpt treatment,     Action/Plan:   tbd,   Anticipated DC Date:  01/22/2013   Anticipated DC Plan:  HOME/SELF CARE  In-house referral  NA      DC Planning Services  NA      PAC Choice  NA   Choice offered to / List presented to:  NA   DME arranged  NA      DME agency  NA     [place] arranged  NA      [place] agency  NA   Status of service:  In process, will continue to follow Medicare Important Message given?  NA - LOS <3 / Initial given by admissions (If response is "NO", the following Medicare IM given date fields will be blank) Date Medicare IM given:   Date Additional Medicare IM given:    Discharge Disposition:    Per UR Regulation:  Reviewed for med. necessity/level of care/duration of stay  If discussed at [place] of Stay Meetings, dates discussed:    Comments:  [id number]/Rhonda Eldridge Dace, BSN, [place] [phone] Chart Reviewed for discharge and hospital needs. Discharge needs at time of review:  None present will follow for needs. Review of patient progress due on [id number].

## 2013-01-19 NOTE — ED Notes (Signed)
Pt went to [facility] ed

## 2013-01-19 NOTE — ED Notes (Signed)
Phlebotomy at bedside drawing labs.

## 2013-01-19 NOTE — ED Notes (Signed)
Pt states she has felt asthma sx x 3 days and today it got worse. Has been intubated multiple times for asthma. Also has severe allergies and seizures. Alert and oriented now. Slightly labored to breathe.

## 2013-01-20 ENCOUNTER — Inpatient Hospital Stay (HOSPITAL_COMMUNITY): Payer: Medicaid Other

## 2013-01-20 DIAGNOSIS — D72829 Elevated white blood cell count, unspecified: Secondary | ICD-10-CM

## 2013-01-20 DIAGNOSIS — G40909 Epilepsy, unspecified, not intractable, without status epilepticus: Secondary | ICD-10-CM

## 2013-01-20 DIAGNOSIS — D649 Anemia, unspecified: Secondary | ICD-10-CM

## 2013-01-20 LAB — CBC
HCT: 34.3 % — ABNORMAL LOW (ref 36.0–46.0)
HEMOGLOBIN: 11.4 g/dL — AB (ref 12.0–15.0)
MCH: 27.8 pg (ref 26.0–34.0)
MCHC: 33.2 g/dL (ref 30.0–36.0)
MCV: 83.7 fL (ref 78.0–100.0)
Platelets: 280 10*3/uL (ref 150–400)
RBC: 4.1 MIL/uL (ref 3.87–5.11)
RDW: 15 % (ref 11.5–15.5)
WBC: 29.3 10*3/uL — AB (ref 4.0–10.5)

## 2013-01-20 LAB — PHOSPHORUS: Phosphorus: 2.9 mg/dL (ref 2.3–4.6)

## 2013-01-20 LAB — MAGNESIUM: Magnesium: 2 mg/dL (ref 1.5–2.5)

## 2013-01-20 LAB — TSH: TSH: 0.844 u[IU]/mL (ref 0.350–4.500)

## 2013-01-20 MED ORDER — TECHNETIUM TO 99M ALBUMIN AGGREGATED
5.5000 | Freq: Once | INTRAVENOUS | Status: AC | PRN
  Administered 2013-01-20: 6 via INTRAVENOUS

## 2013-01-20 MED ORDER — NAPROXEN 250 MG PO TABS
250.0000 mg | ORAL_TABLET | Freq: Two times a day (BID) | ORAL | Status: DC
  Administered 2013-01-20 – 2013-01-22 (×5): 250 mg via ORAL
  Filled 2013-01-20 (×7): qty 1

## 2013-01-20 MED ORDER — NAPROXEN SODIUM 220 MG PO TABS
220.0000 mg | ORAL_TABLET | Freq: Two times a day (BID) | ORAL | Status: DC
  Filled 2013-01-20 (×4): qty 1

## 2013-01-20 MED ORDER — ACETAMINOPHEN 325 MG PO TABS: 650.0000 mg | ORAL_TABLET | Freq: Four times a day (QID) | ORAL | Status: DC | PRN

## 2013-01-20 MED ORDER — DIPHENHYDRAMINE HCL 25 MG PO CAPS
25.0000 mg | ORAL_CAPSULE | Freq: Four times a day (QID) | ORAL | Status: DC | PRN
  Administered 2013-01-20 (×3): 25 mg via ORAL
  Filled 2013-01-20 (×3): qty 1

## 2013-01-20 MED ORDER — ALBUTEROL SULFATE (2.5 MG/3ML) 0.083% IN NEBU
2.5000 mg | INHALATION_SOLUTION | Freq: Two times a day (BID) | RESPIRATORY_TRACT | Status: DC
  Administered 2013-01-21 (×2): 2.5 mg via RESPIRATORY_TRACT
  Filled 2013-01-20 (×2): qty 3

## 2013-01-20 MED ORDER — METHYLPREDNISOLONE SODIUM SUCC 125 MG IJ SOLR
60.0000 mg | Freq: Two times a day (BID) | INTRAMUSCULAR | Status: DC
  Administered 2013-01-21 (×2): 60 mg via INTRAVENOUS
  Filled 2013-01-20 (×4): qty 0.96

## 2013-01-20 MED ORDER — ALBUTEROL SULFATE (2.5 MG/3ML) 0.083% IN NEBU
2.5000 mg | INHALATION_SOLUTION | Freq: Four times a day (QID) | RESPIRATORY_TRACT | Status: DC | PRN
Start: 2013-01-20 — End: 2013-01-22

## 2013-01-20 MED ORDER — TECHNETIUM TC 99M DIETHYLENETRIAME-PENTAACETIC ACID: 40.0000 | Freq: Once | INTRAVENOUS | Status: DC | PRN

## 2013-01-20 MED ORDER — OXYCODONE HCL 5 MG PO TABS
10.0000 mg | ORAL_TABLET | Freq: Three times a day (TID) | ORAL | Status: DC | PRN
  Administered 2013-01-20 – 2013-01-21 (×4): 10 mg via ORAL
  Filled 2013-01-20 (×4): qty 2

## 2013-01-20 NOTE — Progress Notes (Signed)
[facility] PROGRESS NOTE    Fatoumata Albaugh ERX:[id number] DOB: [birth year] DOA: 01/19/2013 PCP: No PCP Per Patient  HPI/Brief narrative 23 year old female patient with history of asthma, multiple intubations in the past-last in August 2014 , seizure disorder, sickle cell trait, admitted with complaints of productive cough, right-sided pleuritic chest pain and asthma exacerbation. She moved to [facility] recently and does not have PCP.   Assessment/Plan:  Acute asthma exacerbation - Prior history of VDRF- last 08/2012 - Admitted to step down unit for close monitoring in case she were to deteriorate and require ventilatory support. - Continue oxygen, Bronchodilator nebulization's, IV Solu-Medrol and levofloxacin. - Will need PCP/Pulmonology for OP follow up. - Improved. Due to presentation of SOB, muscular/pleuritic CP that has been ongoing and elevated d-dimer, VQ scan was performed and negative. Unable to do CT at bedtime secondary to?? Allergy to contrast versus panic attack from CT. - We'll taper steroids and transferred to medical bed.  Anemia/Leukocytosis - Anemia likely chronic. Stable - Leukocytosis: probably from steroids.  History of Seizure - Last seizure in 08/2012. Continue home Tegratol  History of Sickle cell Trait  Muscular/pleuritic right upper chest pain - Resumed home Naprosyn and monitor. VQ scan negative.  Chronic poor appetite and weight loss - History provided by mother. Unclear etiology. Outpatient followup and evaluation by PCP as deemed necessary.  Code Status: Full Family Communication: Discussed with patient's mother and stepfather at bedside Disposition Plan: Transfer to medical floor. Home possibly 1/7   Consultants:  None  Procedures:  None  Antibiotics:  Levaquin  Subjective: Indicates that dyspnea is much better. Complains of moderate pain right upper anterior chest and shoulder, worse with deep inspiration and movement of  shoulder. Denies history of trauma or lifting anything unusual.  Objective: Filed Vitals:   01/20/13 0800 01/20/13 1049 01/20/13 1200 01/20/13 1400  BP: 111/66   115/70  Pulse: 45     Temp: 98 F (36.7 C)  97.9 F (36.6 C)   TempSrc: Oral  Oral   Resp: 11   14  Height:      Weight:      SpO2: 100% 99%  99%    Intake/Output Summary (Last 24 hours) at 01/20/13 1624 Last data filed at 01/20/13 1400  Gross per 24 hour  Intake 869.99 ml  Output   1000 ml  Net -130.01 ml   Filed Weights   01/19/13 0834 01/20/13 0334  Weight: 69.9 kg (154 lb 1.6 oz) 69.8 kg (153 lb 14.1 oz)     Exam:  General exam: Young female lying comfortably in bed. Respiratory system: Clear to auscultation. No increased work of breathing. Able to speak in full sentences. Cardiovascular system: S1 & S2 heard, RRR. No JVD, murmurs, gallops, clicks or pedal edema. Telemetry: Sinus rhythm Gastrointestinal system: Abdomen is nondistended, soft and nontender. Normal bowel sounds heard. Central nervous system: Alert and oriented. No focal neurological deficits. Extremities: Symmetric 5 x 5 power.   Data Reviewed: Basic Metabolic Panel:  Recent Labs Lab 01/19/13 0156 01/20/13 0347  NA 141  --   K 3.6*  --   CL 103  --   CO2 26  --   GLUCOSE 106*  --   BUN 5*  --   CREATININE 0.78  --   CALCIUM 9.3  --   MG  --  2.0  PHOS  --  2.9   Liver Function Tests: No results found for this basename: AST, ALT, ALKPHOS, BILITOT, PROT, ALBUMIN,  in  the last 168 hours No results found for this basename: LIPASE, AMYLASE,  in the last 168 hours No results found for this basename: AMMONIA,  in the last 168 hours CBC:  Recent Labs Lab 01/19/13 0156 01/20/13 0347  WBC 14.8* 29.3*  NEUTROABS 10.2*  --   HGB 11.9* 11.4*  HCT 35.0* 34.3*  MCV 83.1 83.7  PLT 266 280   Cardiac Enzymes: No results found for this basename: CKTOTAL, CKMB, CKMBINDEX, TROPONINI,  in the last 168 hours BNP (last 3 results) No  results found for this basename: PROBNP,  in the last 8760 hours CBG: No results found for this basename: GLUCAP,  in the last 168 hours  Recent Results (from the past 240 hour(s))  MRSA PCR SCREENING     Status: None   Collection Time    01/19/13  8:47 AM      Result Value Range Status   MRSA by PCR NEGATIVE  NEGATIVE Final   Comment:            The GeneXpert MRSA Assay (FDA     approved for NASAL specimens     only), is one component of a     comprehensive MRSA colonization     surveillance program. It is not     intended to diagnose MRSA     infection nor to guide or     monitor treatment for     MRSA infections.       Studies: Nm Pulmonary Perf And Vent  01/20/2013   CLINICAL DATA:  Right-sided chest pain, shortness of lower case, no history pulmonary embolism  EXAM: NUCLEAR MEDICINE VENTILATION - PERFUSION LUNG SCAN  TECHNIQUE: Ventilation images were obtained in multiple projections using inhaled aerosol technetium 99 M DTPA. Perfusion images were obtained in multiple projections after intravenous injection of Tc-40m MAA.  COMPARISON:  None  Correlation:  Chest radiograph 01/19/2013  RADIOPHARMACEUTICALS:  40.0 mCi Tc-36m DTPA aerosol and 5.5 mCi Tc-36m MAA  FINDINGS: Ventilation: Central airway deposition of tracer. Small amount of swallowed aerosol within the stomach. No focal ventilatory defects identified.  Perfusion: Normal perfusion lung scan. No perfusion defects identified.  Chest radiograph is clear.  IMPRESSION: Normal ventilation and perfusion lung scan.   Electronically Signed   By: Lavonia Dana M.D.   On: 01/20/2013 15:42   Dg Chest Portable 1 View  01/19/2013   CLINICAL DATA:  Asthma, right chest pain.  EXAM: PORTABLE CHEST - 1 VIEW  COMPARISON:  None available for comparison at time of study interpretation.  FINDINGS: Cardiomediastinal silhouette is unremarkable for this low inspiratory portable examination with crowded vasculature markings. The lungs are clear without  pleural effusions or focal consolidations. Trachea projects midline and there is no pneumothorax. Included soft tissue planes and osseous structures are non-suspicious. Multiple EKG lines overlie the patient and may obscure subtle underlying pathology.  IMPRESSION: No acute cardiopulmonary process.   Electronically Signed   By: Elon Alas   On: 01/19/2013 04:25        Scheduled Meds: Continuous Infusions:  Active Problems:   Asthma exacerbation   Seizure disorder   Anemia   Leukocytosis, unspecified    Time spent: 25 minutes    HONGALGI,ANAND, MD, FACP, FHM. [facility] Pager [phone]  If 7PM-7AM, please contact night-coverage [email or web address] Password [email or web address] 01/20/2013, 4:24 PM    LOS: 1 day

## 2013-01-21 NOTE — Progress Notes (Signed)
[facility] PROGRESS NOTE    Jasmine [name] [id number] DOB: [birth year] DOA: 01/19/2013 PCP: No PCP Per Patient  HPI/Brief narrative 23 year old female patient with history of asthma, multiple intubations in the past-last in August 2014 , seizure disorder, sickle cell trait, admitted with complaints of productive cough, right-sided pleuritic chest pain and asthma exacerbation. She moved to [facility] recently and does not have PCP.   Assessment/Plan:  Acute asthma exacerbation - Prior history of VDRF- last 08/2012 - Admitted to step down unit for close monitoring in case she were to deteriorate and require ventilatory support. - Continue oxygen, Bronchodilator nebulization's, IV Solu-Medrol and levofloxacin. - Will need PCP/Pulmonology for OP follow up. - Improved. Due to presentation of SOB, muscular/pleuritic CP that has been ongoing and elevated d-dimer, VQ scan was performed and negative. Unable to do CT at secondary to?? Allergy to contrast versus panic attack from CT. - We'll taper steroids and transferred to medical bed. - Patient states that she is allergic to prednisone (hives) and has not used any other oral steroids. Unable to obtain office records or contact from M.D. in [place]. - As per discussion with mother, patient is extremely noncompliant with all her medications including one for asthma and seizures. Apparently her M.D. in [place] was considering court-appointed guardianship for her. Per mother, her M.D. will be in office tomorrow-will attempt to discuss. - Improved  Anemia/Leukocytosis - Anemia likely chronic. Stable - Leukocytosis: probably from steroids.  History of Seizure - Last seizure in 08/2012. Continue home Tegratol  History of Sickle cell Trait  Muscular/pleuritic right upper chest pain - Resumed home Naprosyn and monitor. VQ scan negative. Patient states that her pain is gradually improving and is 30% better.  Chronic poor appetite and weight loss -  History provided by mother. Unclear etiology. Outpatient followup and evaluation by PCP as deemed necessary.  Code Status: Full Family Communication: Discussed with patient's mother Disposition Plan: Home possibly 1/8   Consultants:  None  Procedures:  None  Antibiotics:  Levaquin  Subjective: Denies dyspnea. Right-sided chest pain decreased by about 30%. Wishes to go home.  Objective: Filed Vitals:   01/20/13 1910 01/20/13 2047 01/20/13 2102 01/21/13 0521  BP: 114/67 103/45  106/53  Pulse: 63 58  94  Temp: 97.8 F (36.6 C) 97.8 F (36.6 C)  97.9 F (36.6 C)  TempSrc: Oral Oral  Oral  Resp: 18 18  16   Height:  5\' 4"  (1.626 m)    Weight:  69.4 kg (153 lb)    SpO2: 100% 100% 100% 94%    Intake/Output Summary (Last 24 hours) at 01/21/13 1715 Last data filed at 01/20/13 1800  Gross per 24 hour  Intake    150 ml  Output      0 ml  Net    150 ml   Filed Weights   01/19/13 0834 01/20/13 0334 01/20/13 2047  Weight: 69.9 kg (154 lb 1.6 oz) 69.8 kg (153 lb 14.1 oz) 69.4 kg (153 lb)     Exam: Examined with female nurse in the room. General exam: Young female lying comfortably in bed. Respiratory system: Clear to auscultation. No increased work of breathing. Able to speak in full sentences. No focal tenderness. No skin rashes. Cardiovascular system: S1 & S2 heard, RRR. No JVD, murmurs, gallops, clicks or pedal edema. Telemetry: Sinus rhythm Gastrointestinal system: Abdomen is nondistended, soft and nontender. Normal bowel sounds heard. Central nervous system: Alert and oriented. No focal neurological deficits. Extremities: Symmetric 5 x 5 power.  Breast exam: Right breast exam unremarkable.   Data Reviewed: Basic Metabolic Panel:  Recent Labs Lab 01/19/13 0156 01/20/13 0347  NA 141  --   K 3.6*  --   CL 103  --   CO2 26  --   GLUCOSE 106*  --   BUN 5*  --   CREATININE 0.78  --   CALCIUM 9.3  --   MG  --  2.0  PHOS  --  2.9   Liver Function Tests: No  results found for this basename: AST, ALT, ALKPHOS, BILITOT, PROT, ALBUMIN,  in the last 168 hours No results found for this basename: LIPASE, AMYLASE,  in the last 168 hours No results found for this basename: AMMONIA,  in the last 168 hours CBC:  Recent Labs Lab 01/19/13 0156 01/20/13 0347  WBC 14.8* 29.3*  NEUTROABS 10.2*  --   HGB 11.9* 11.4*  HCT 35.0* 34.3*  MCV 83.1 83.7  PLT 266 280   Cardiac Enzymes: No results found for this basename: CKTOTAL, CKMB, CKMBINDEX, TROPONINI,  in the last 168 hours BNP (last 3 results) No results found for this basename: PROBNP,  in the last 8760 hours CBG: No results found for this basename: GLUCAP,  in the last 168 hours  Recent Results (from the past 240 hour(s))  MRSA PCR SCREENING     Status: None   Collection Time    01/19/13  8:47 AM      Result Value Range Status   MRSA by PCR NEGATIVE  NEGATIVE Final   Comment:            The GeneXpert MRSA Assay (FDA     approved for NASAL specimens     only), is one component of a     comprehensive MRSA colonization     surveillance program. It is not     intended to diagnose MRSA     infection nor to guide or     monitor treatment for     MRSA infections.       Studies: Nm Pulmonary Perf And Vent  01/20/2013   CLINICAL DATA:  Right-sided chest pain, shortness of lower case, no history pulmonary embolism  EXAM: NUCLEAR MEDICINE VENTILATION - PERFUSION LUNG SCAN  TECHNIQUE: Ventilation images were obtained in multiple projections using inhaled aerosol technetium 99 M DTPA. Perfusion images were obtained in multiple projections after intravenous injection of Tc-73m MAA.  COMPARISON:  None  Correlation:  Chest radiograph 01/19/2013  RADIOPHARMACEUTICALS:  40.0 mCi Tc-45m DTPA aerosol and 5.5 mCi Tc-20m MAA  FINDINGS: Ventilation: Central airway deposition of tracer. Small amount of swallowed aerosol within the stomach. No focal ventilatory defects identified.  Perfusion: Normal perfusion lung  scan. No perfusion defects identified.  Chest radiograph is clear.  IMPRESSION: Normal ventilation and perfusion lung scan.   Electronically Signed   By: Lavonia Dana M.D.   On: 01/20/2013 15:42        Scheduled Meds: Continuous Infusions:  Active Problems:   Asthma exacerbation   Seizure disorder   Anemia   Leukocytosis, unspecified    Time spent: 25 minutes    Jasmine Posey, MD, FACP, FHM. [facility] Pager [phone]  If 7PM-7AM, please contact night-coverage [email or web address] Password [email or web address] 01/21/2013, 5:15 PM    LOS: 2 days

## 2013-01-21 NOTE — Care Management Note (Signed)
Cm spoke with patient at the bedside concerning discharge planning. Pt states living alone, has transportation. Pt recently relocated from [place]. Pt agreeable to establishing PCP in [place]. Pt provided information concerning [facility] Per pt's permission follow up appt scheduled, documented on AVS. MD request medical records from [facility]. Cm faxed request to hospital, confirmation received and placed in shadow chart. Awaiting records.    Venita Lick Pennye Beeghly,MSN,RN [phone]

## 2013-01-22 NOTE — Progress Notes (Signed)
Pt. Requested to sign herself out AMA.  Education was provided and pt. Signed the form and it was then placed in the chart.  MD was notified.

## 2013-01-22 NOTE — Discharge Summary (Addendum)
Physician Discharge Summary  Canyon Willow JKK:[id number] DOB: 02/03/1990 DOA: 01/19/2013  PCP: No PCP Per Patient  Admit date: 01/19/2013 Discharge date: 01/22/2013  Patient left the hospital [place]  Time spent: Less than 30 minutes  Recommendations for Outpatient Follow-up:  1. Patient and mother were advised to seek immediate medical attention if there is any decline in her status. They verbalized understanding.  Discharge Diagnoses:  Active Problems:   Asthma exacerbation   Seizure disorder   Anemia   Leukocytosis, unspecified    History of present illness & hospital course:  23 year old female with history of asthma, multiple intubations in the past-last in August 2014, seizure disorder, sickle cell trait, noncompliant with medications, recently moved to the [place] area from [place] in September 2014, was admitted on 01/19/13 with complaints of productive cough, right-sided pleuritic chest pain he and asthma exacerbation. Due to her high risk history (intubations), she was initially admitted to the step down unit for close monitoring. She was treated with oxygen, bronchodilator nebulizations, IV Solu-Medrol and levofloxacin. Due to her presentation of dyspnea, muscular/pleuritic chest pain which was ongoing, elevated d-dimer, VQ scan was performed and negative. Unable to perform CTA chest secondary to? Allergy to contrast versus panic attack from CT (as per mother). Once she improved, her IV Solu-Medrol was tapered and she was transferred to a medical bed. Her right-sided chest pain was felt to be pleuritic of unclear etiology but was improving. Anemia was most likely chronic but stable. Leukocytosis was possibly from steroids and she did not have any clinical focus of sepsis. She was continued on Tegretol for seizure and her last seizure was apparently in August 2014. Attempts to obtain her medical records from PCP in [place] were unsuccessful. Patient states that whenever she  has an asthma attack, she is treated with IV Solu-Medrol and magnesium in the hospital but is not discharged on any oral steroids. She claims that prednisone causes hives and has not used any other oral steroids. Today, patient stated that her dyspnea had almost resolved. The right sided chest pain was gradually improving and rated at 6/10. MD discussed with patient and her mother and plan was to discuss with patient's PCP in [place] to get further history, discharged meds i.e. steroids so we could arrange for a safe discharge plan. PCP was not available yesterday. Mom also stated that PCP in [place] was trying to ?summon the courts to arrange mom for guardianship due to poor medication compliance. Patient is coherent and competent to make medical decisions but believe that she is making poor judgment. Despite extensive counseling regarding leaving [place], including dangers of decline & death, she did leave AMA.    Discharge Exam:  Filed Vitals:   01/21/13 0521 01/21/13 1400 01/21/13 2039 01/21/13 2143  BP: 106/53 117/46  90/48  Pulse: 94 61  62  Temp: 97.9 F (36.6 C) 97.9 F (36.6 C)  98 F (36.7 C)  TempSrc: Oral Oral  Oral  Resp: 16 18  18   Height:      Weight:      SpO2: 94% 100% 93% 99%    General exam: Young female lying comfortably in bed.  Respiratory system: Clear to auscultation. No increased work of breathing. Able to speak in full sentences. No focal tenderness. No skin rashes.  Cardiovascular system: S1 & S2 heard, RRR. No JVD, murmurs, gallops, clicks or pedal edema.  Gastrointestinal system: Abdomen is nondistended, soft and nontender. Normal bowel sounds heard.  Central  nervous system: Alert and oriented. No focal neurological deficits.  Extremities: Symmetric 5 x 5 power.   Discharge Instructions       Future Appointments Provider Department Dept Phone   01/30/2013 3:15 PM [facility] Covering Provider [place] [phone]       Follow-up Information   Follow up with [facility]     On 01/30/2013. (@ 3:15 with Dr.Abro)    Contact information:   [address] [place] [phone]       The results of significant diagnostics from this hospitalization (including imaging, microbiology, ancillary and laboratory) are listed below for reference.    Significant Diagnostic Studies: Nm Pulmonary Perf And Vent  01/20/2013   CLINICAL DATA:  Right-sided chest pain, shortness of lower case, no history pulmonary embolism  EXAM: NUCLEAR MEDICINE VENTILATION - PERFUSION LUNG SCAN  TECHNIQUE: Ventilation images were obtained in multiple projections using inhaled aerosol technetium 99 M DTPA. Perfusion images were obtained in multiple projections after intravenous injection of Tc-27m MAA.  COMPARISON:  None  Correlation:  Chest radiograph 01/19/2013  RADIOPHARMACEUTICALS:  40.0 mCi Tc-51m DTPA aerosol and 5.5 mCi Tc-64m MAA  FINDINGS: Ventilation: Central airway deposition of tracer. Small amount of swallowed aerosol within the stomach. No focal ventilatory defects identified.  Perfusion: Normal perfusion lung scan. No perfusion defects identified.  Chest radiograph is clear.  IMPRESSION: Normal ventilation and perfusion lung scan.   Electronically Signed   By: Lavonia Dana M.D.   On: 01/20/2013 15:42   Dg Chest Portable 1 View  01/19/2013   CLINICAL DATA:  Asthma, right chest pain.  EXAM: PORTABLE CHEST - 1 VIEW  COMPARISON:  None available for comparison at time of study interpretation.  FINDINGS: Cardiomediastinal silhouette is unremarkable for this low inspiratory portable examination with crowded vasculature markings. The lungs are clear without pleural effusions or focal consolidations. Trachea projects midline and there is no pneumothorax. Included soft tissue planes and osseous structures are non-suspicious. Multiple EKG lines overlie the patient and may obscure subtle underlying  pathology.  IMPRESSION: No acute cardiopulmonary process.   Electronically Signed   By: Elon Alas   On: 01/19/2013 04:25   Dg Knee Complete 4 Views Left  01/02/2013   CLINICAL DATA:  Progressive knee pain post trauma  EXAM: LEFT KNEE - COMPLETE 4+ VIEW  COMPARISON:  None.  FINDINGS: There is no evidence of fracture, dislocation, or joint effusion. There is no evidence of arthropathy or other focal bone abnormality. Soft tissues are unremarkable.  IMPRESSION: Negative.   Electronically Signed   By: Arne Cleveland M.D.   On: 01/02/2013 20:36    Microbiology: Recent Results (from the past 240 hour(s))  MRSA PCR SCREENING     Status: None   Collection Time    01/19/13  8:47 AM      Result Value Range Status   MRSA by PCR NEGATIVE  NEGATIVE Final   Comment:            The GeneXpert MRSA Assay (FDA     approved for NASAL specimens     only), is one component of a     comprehensive MRSA colonization     surveillance program. It is not     intended to diagnose MRSA     infection nor to guide or     monitor treatment for     MRSA infections.     Labs: Basic Metabolic Panel:  Recent Labs Lab 01/19/13 0156 01/20/13 0347  NA 141  --   K 3.6*  --   CL 103  --   CO2 26  --   GLUCOSE 106*  --   BUN 5*  --   CREATININE 0.78  --   CALCIUM 9.3  --   MG  --  2.0  PHOS  --  2.9   Liver Function Tests: No results found for this basename: AST, ALT, ALKPHOS, BILITOT, PROT, ALBUMIN,  in the last 168 hours No results found for this basename: LIPASE, AMYLASE,  in the last 168 hours No results found for this basename: AMMONIA,  in the last 168 hours CBC:  Recent Labs Lab 01/19/13 0156 01/20/13 0347  WBC 14.8* 29.3*  NEUTROABS 10.2*  --   HGB 11.9* 11.4*  HCT 35.0* 34.3*  MCV 83.1 83.7  PLT 266 280   Cardiac Enzymes: No results found for this basename: CKTOTAL, CKMB, CKMBINDEX, TROPONINI,  in the last 168 hours BNP: BNP (last 3 results) No results found for this  basename: PROBNP,  in the last 8760 hours CBG: No results found for this basename: GLUCAP,  in the last 168 hours   Signed:  Vernell Leep, MD, FACP, FHM. [facility] Pager [phone]  If 7PM-7AM, please contact night-coverage [email or web address] Password [facility] 01/22/2013, 6:48 PM

## 2013-01-30 ENCOUNTER — Inpatient Hospital Stay: Payer: Medicaid Other

## 2013-09-21 ENCOUNTER — Emergency Department (HOSPITAL_COMMUNITY)
Admission: EM | Admit: 2013-09-21 | Discharge: 2013-09-21 | Disposition: A | Discharge status: Home / Self Care | Payer: No Typology Code available for payment source | Attending: Emergency Medicine | Admitting: Emergency Medicine

## 2013-09-21 ENCOUNTER — Encounter (HOSPITAL_COMMUNITY): Payer: Self-pay | Admitting: Emergency Medicine

## 2013-09-21 DIAGNOSIS — Z791 Long term (current) use of non-steroidal anti-inflammatories (NSAID): Secondary | ICD-10-CM | POA: Insufficient documentation

## 2013-09-21 DIAGNOSIS — J45909 Unspecified asthma, uncomplicated: Secondary | ICD-10-CM | POA: Insufficient documentation

## 2013-09-21 DIAGNOSIS — T7421XA Adult sexual abuse, confirmed, initial encounter: Secondary | ICD-10-CM | POA: Insufficient documentation

## 2013-09-21 DIAGNOSIS — Z79899 Other long term (current) drug therapy: Secondary | ICD-10-CM | POA: Diagnosis not present

## 2013-09-21 DIAGNOSIS — Z862 Personal history of diseases of the blood and blood-forming organs and certain disorders involving the immune mechanism: Secondary | ICD-10-CM | POA: Diagnosis not present

## 2013-09-21 DIAGNOSIS — IMO0002 Reserved for concepts with insufficient information to code with codable children: Secondary | ICD-10-CM | POA: Insufficient documentation

## 2013-09-21 DIAGNOSIS — Z91040 Latex allergy status: Secondary | ICD-10-CM | POA: Diagnosis not present

## 2013-09-21 DIAGNOSIS — Z880 Allergy status to penicillin: Secondary | ICD-10-CM | POA: Diagnosis not present

## 2013-09-21 LAB — URINALYSIS, ROUTINE W REFLEX MICROSCOPIC
Bilirubin Urine: NEGATIVE
GLUCOSE, UA: NEGATIVE mg/dL
Hgb urine dipstick: NEGATIVE
Ketones, ur: NEGATIVE mg/dL
Nitrite: NEGATIVE
PH: 6 (ref 5.0–8.0)
Protein, ur: NEGATIVE mg/dL
SPECIFIC GRAVITY, URINE: 1.019 (ref 1.005–1.030)
Urobilinogen, UA: 0.2 mg/dL (ref 0.0–1.0)

## 2013-09-21 LAB — URINE MICROSCOPIC-ADD ON

## 2013-09-21 MED ORDER — LORAZEPAM 1 MG PO TABS
1.0000 mg | ORAL_TABLET | Freq: Once | ORAL | Status: AC
  Administered 2013-09-21: 1 mg via ORAL
  Filled 2013-09-21: qty 1
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
## 2013-09-21 NOTE — ED Notes (Signed)
SANE nurse paged
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
## 2013-09-21 NOTE — ED Notes (Signed)
Patient alert, oriented, and no outwards signs of injury.

## 2013-09-23 NOTE — ED Provider Notes (Signed)
Medical screening examination/treatment/procedure(s) were performed by non-physician practitioner and as supervising physician I was immediately available for consultation/collaboration.    Johnna Acosta, MD 09/23/13 [phone]

## 2014-01-06 ENCOUNTER — Encounter (HOSPITAL_COMMUNITY): Payer: Self-pay | Admitting: *Deleted

## 2014-01-06 ENCOUNTER — Emergency Department (HOSPITAL_COMMUNITY)
Admission: EM | Admit: 2014-01-06 | Discharge: 2014-01-06 | Discharge status: Against Medical Advice | Payer: Medicaid Other | Attending: Emergency Medicine | Admitting: Emergency Medicine

## 2014-01-06 DIAGNOSIS — M25561 Pain in right knee: Secondary | ICD-10-CM | POA: Insufficient documentation

## 2014-01-06 DIAGNOSIS — J111 Influenza due to unidentified influenza virus with other respiratory manifestations: Secondary | ICD-10-CM | POA: Insufficient documentation

## 2014-01-06 DIAGNOSIS — J45909 Unspecified asthma, uncomplicated: Secondary | ICD-10-CM | POA: Insufficient documentation

## 2014-01-06 NOTE — ED Notes (Signed)
Pt states she needs to leave and does not wish to stay and be seen by doctor

## 2014-01-06 NOTE — ED Notes (Signed)
The pt is c/o lrt knee pain and she feels like she has the flu for 3 days  lmp nov 5th

## 2014-01-11 ENCOUNTER — Encounter (HOSPITAL_COMMUNITY): Payer: Self-pay | Admitting: Adult Health

## 2014-01-11 DIAGNOSIS — Z880 Allergy status to penicillin: Secondary | ICD-10-CM | POA: Insufficient documentation

## 2014-01-11 DIAGNOSIS — G40909 Epilepsy, unspecified, not intractable, without status epilepticus: Secondary | ICD-10-CM | POA: Insufficient documentation

## 2014-01-11 DIAGNOSIS — Z791 Long term (current) use of non-steroidal anti-inflammatories (NSAID): Secondary | ICD-10-CM | POA: Insufficient documentation

## 2014-01-11 DIAGNOSIS — Z862 Personal history of diseases of the blood and blood-forming organs and certain disorders involving the immune mechanism: Secondary | ICD-10-CM | POA: Diagnosis not present

## 2014-01-11 DIAGNOSIS — J45909 Unspecified asthma, uncomplicated: Secondary | ICD-10-CM | POA: Diagnosis present

## 2014-01-11 DIAGNOSIS — Z79899 Other long term (current) drug therapy: Secondary | ICD-10-CM | POA: Diagnosis not present

## 2014-01-11 DIAGNOSIS — J45901 Unspecified asthma with (acute) exacerbation: Secondary | ICD-10-CM | POA: Diagnosis not present

## 2014-01-11 DIAGNOSIS — Z91040 Latex allergy status: Secondary | ICD-10-CM | POA: Insufficient documentation

## 2014-01-11 DIAGNOSIS — Z3202 Encounter for pregnancy test, result negative: Secondary | ICD-10-CM | POA: Insufficient documentation

## 2014-01-11 MED ORDER — ALBUTEROL SULFATE (2.5 MG/3ML) 0.083% IN NEBU
INHALATION_SOLUTION | RESPIRATORY_TRACT | Status: AC
  Filled 2014-01-11: qty 6

## 2014-01-11 MED ORDER — ALBUTEROL SULFATE (2.5 MG/3ML) 0.083% IN NEBU
5.0000 mg | INHALATION_SOLUTION | Freq: Once | RESPIRATORY_TRACT | Status: AC
  Administered 2014-01-11: 5 mg via RESPIRATORY_TRACT

## 2014-01-11 NOTE — ED Notes (Signed)
Presents with diminished breath sounds, asthma acting up and wheezing for 2 days. Pt is coughing up blood and also c/o right flank pain, Sats 100%.

## 2014-01-12 ENCOUNTER — Emergency Department (HOSPITAL_COMMUNITY): Payer: Medicaid Other

## 2014-01-12 ENCOUNTER — Encounter (HOSPITAL_COMMUNITY): Payer: Self-pay | Admitting: Emergency Medicine

## 2014-01-12 ENCOUNTER — Emergency Department (HOSPITAL_COMMUNITY)
Admission: EM | Admit: 2014-01-12 | Discharge: 2014-01-12 | Disposition: A | Discharge status: Home / Self Care | Payer: Medicaid Other | Attending: Emergency Medicine | Admitting: Emergency Medicine

## 2014-01-12 DIAGNOSIS — R0602 Shortness of breath: Secondary | ICD-10-CM

## 2014-01-12 DIAGNOSIS — J45909 Unspecified asthma, uncomplicated: Secondary | ICD-10-CM

## 2014-01-12 LAB — URINALYSIS, ROUTINE W REFLEX MICROSCOPIC
Bilirubin Urine: NEGATIVE
GLUCOSE, UA: NEGATIVE mg/dL
HGB URINE DIPSTICK: NEGATIVE
Ketones, ur: 15 mg/dL — AB
Nitrite: NEGATIVE
PH: 5.5 (ref 5.0–8.0)
Protein, ur: NEGATIVE mg/dL
SPECIFIC GRAVITY, URINE: 1.031 — AB (ref 1.005–1.030)
Urobilinogen, UA: 1 mg/dL (ref 0.0–1.0)

## 2014-01-12 LAB — CBC WITH DIFFERENTIAL/PLATELET
Basophils Absolute: 0 10*3/uL (ref 0.0–0.1)
Basophils Relative: 0 % (ref 0–1)
Eosinophils Absolute: 0.4 10*3/uL (ref 0.0–0.7)
Eosinophils Relative: 3 % (ref 0–5)
HEMATOCRIT: 41.5 % (ref 36.0–46.0)
Hemoglobin: 13.8 g/dL (ref 12.0–15.0)
LYMPHS ABS: 2.9 10*3/uL (ref 0.7–4.0)
LYMPHS PCT: 20 % (ref 12–46)
MCH: 29.5 pg (ref 26.0–34.0)
MCHC: 33.3 g/dL (ref 30.0–36.0)
MCV: 88.7 fL (ref 78.0–100.0)
MONO ABS: 0.8 10*3/uL (ref 0.1–1.0)
Monocytes Relative: 6 % (ref 3–12)
NEUTROS ABS: 10 10*3/uL — AB (ref 1.7–7.7)
Neutrophils Relative %: 71 % (ref 43–77)
Platelets: 229 10*3/uL (ref 150–400)
RBC: 4.68 MIL/uL (ref 3.87–5.11)
RDW: 14.5 % (ref 11.5–15.5)
WBC: 14.1 10*3/uL — AB (ref 4.0–10.5)

## 2014-01-12 LAB — I-STAT CHEM 8, ED
BUN: 11 mg/dL (ref 6–23)
CHLORIDE: 106 meq/L (ref 96–112)
CREATININE: 0.8 mg/dL (ref 0.50–1.10)
Calcium, Ion: 1.22 mmol/L (ref 1.12–1.23)
Glucose, Bld: 76 mg/dL (ref 70–99)
HCT: 47 % — ABNORMAL HIGH (ref 36.0–46.0)
Hemoglobin: 16 g/dL — ABNORMAL HIGH (ref 12.0–15.0)
Potassium: 4 mmol/L (ref 3.5–5.1)
SODIUM: 141 mmol/L (ref 135–145)
TCO2: 23 mmol/L (ref 0–100)

## 2014-01-12 LAB — POC URINE PREG, ED: Preg Test, Ur: NEGATIVE

## 2014-01-12 LAB — URINE MICROSCOPIC-ADD ON

## 2014-01-12 LAB — D-DIMER, QUANTITATIVE: D-Dimer, Quant: 0.42 ug/mL-FEU (ref 0.00–0.48)

## 2014-01-12 MED ORDER — ALBUTEROL SULFATE HFA 108 (90 BASE) MCG/ACT IN AERS: 1.0000 | INHALATION_SPRAY | Freq: Four times a day (QID) | RESPIRATORY_TRACT | Status: DC | PRN

## 2014-01-12 MED ORDER — ALBUTEROL SULFATE (2.5 MG/3ML) 0.083% IN NEBU
5.0000 mg | INHALATION_SOLUTION | Freq: Once | RESPIRATORY_TRACT | Status: AC
  Administered 2014-01-12: 5 mg via RESPIRATORY_TRACT
  Filled 2014-01-12: qty 6

## 2014-01-12 MED ORDER — METHYLPREDNISOLONE SODIUM SUCC 125 MG IJ SOLR
125.0000 mg | Freq: Once | INTRAMUSCULAR | Status: AC
  Administered 2014-01-12: 125 mg via INTRAVENOUS
  Filled 2014-01-12: qty 2

## 2014-01-12 MED ORDER — FLUTICASONE-SALMETEROL 500-50 MCG/DOSE IN AEPB: 1.0000 | INHALATION_SPRAY | Freq: Two times a day (BID) | RESPIRATORY_TRACT | Status: DC

## 2014-01-12 NOTE — ED Notes (Signed)
Pt ambulatory to restroom unassisted. No respiratory distress noted.

## 2014-01-12 NOTE — Discharge Instructions (Signed)

## 2014-01-12 NOTE — ED Provider Notes (Signed)
CSN: [id number]     Arrival date & time 01/11/14  2330 History  This chart was scribe for Jasmine [name] K Faelynn Wynder-Rasch, MD by Judithann Sauger, ED Scribe. The patient was seen in room D35C/D35C and the patient's care was started at 12:02 PM.    Chief Complaint  Patient presents with  . Asthma   Patient is a 23 y.o. female presenting with wheezing. The history is provided by the patient. The history is limited by the condition of the patient. No language interpreter was used.  Wheezing Severity:  Severe Severity compared to prior episodes:  Similar Onset quality:  Gradual Timing:  Constant Progression:  Worsening Chronicity:  Recurrent Context: smoke exposure   Context: not animal exposure   Context comment:  Patient is still smoking Relieved by:  Nothing Worsened by:  Nothing tried Ineffective treatments:  None tried Associated symptoms: no chest pain, no fever and no rash   Risk factors: no suspected foreign body    HPI Comments: Jasmine [name] is a 24 y.o. female who presents to the Emergency Department as per nurse, after 2 days of SOB and wheezing. She reports that pt's albuterol at home is not working but her mother states that she does not have an inhaler. She also c/o sever right flank pain with associated coughing of blood. The pt denies birth control use and long trips. She reports that she is a current smoker.   Past Medical History  Diagnosis Date  . Asthma   . Seizures   . Sickle cell trait    Past Surgical History  Procedure Laterality Date  . Nerve, tendon and artery repair Right 09/23/2012    Procedure: I&D and Repair As Necessary/Right Hand and Palm;  Surgeon: Roseanne Kaufman, MD;  Location: [facility];  Service: Orthopedics;  Laterality: Right;  . Tonsillectomy    . Hernia repair    . Cesarean section    . Hand surgery    . Appendectomy     History reviewed. No pertinent family history. History  Substance Use Topics  . Smoking status: Never Smoker   . Smokeless  tobacco: Never Used  . Alcohol Use: No   OB History    No data available     Review of Systems  Unable to perform ROS: Acuity of condition  Constitutional: Negative for fever.  Respiratory: Positive for wheezing.   Cardiovascular: Negative for chest pain, palpitations and leg swelling.  Skin: Negative for rash.      Allergies  Avocado; Cheese; Chocolate; Ibuprofen; Orange juice; Peach; Peanuts; Pear; Prednisone; Raspberry; Tylenol; Amoxicillin; Doxycycline; Erythromycin; Ivp dye; Latex; and Penicillins  Home Medications   Prior to Admission medications   Medication Sig Start Date End Date Taking? Authorizing Provider  albuterol (PROVENTIL HFA;VENTOLIN HFA) 108 (90 BASE) MCG/ACT inhaler Inhale 2 puffs into the lungs every 4 (four) hours as needed for wheezing or shortness of breath.   Yes Historical Provider, MD  albuterol (PROVENTIL) (5 MG/ML) 0.5% nebulizer solution Take 2.5 mg by nebulization every 6 (six) hours as needed for wheezing or shortness of breath.   Yes Historical Provider, MD  oxycodone (OXY-IR) 5 MG capsule Take 10 mg by mouth every 8 (eight) hours as needed for pain (hand/knee).   Yes Historical Provider, MD  albuterol (PROVENTIL HFA;VENTOLIN HFA) 108 (90 BASE) MCG/ACT inhaler Inhale 1-2 puffs into the lungs every 6 (six) hours as needed for wheezing or shortness of breath. 01/12/14   Leialoha Hanna K Aivan Fillingim-Rasch, MD  carbamazepine (TEGRETOL XR) 200 MG  12 hr tablet Take 200 mg by mouth 3 (three) times daily.    Historical Provider, MD  Fluticasone-Salmeterol (ADVAIR DISKUS) 500-50 MCG/DOSE AEPB Inhale 1 puff into the lungs 2 (two) times daily. 01/12/14   Eris Breck K Milbert Bixler-Rasch, MD  Fluticasone-Salmeterol (ADVAIR) 500-50 MCG/DOSE AEPB Inhale 1 puff into the lungs 2 (two) times daily.    Historical Provider, MD  naproxen sodium (ANAPROX) 220 MG tablet Take 220 mg by mouth 2 (two) times daily with a meal.    Historical Provider, MD   BP 118/58 mmHg  Pulse 113  Temp(Src) 98 F  (36.7 C) (Oral)  Resp 30  SpO2 98%  LMP 12/18/2013 Physical Exam  Constitutional: She is oriented to person, place, and time. She appears well-developed and well-nourished. No distress.  HENT:  Head: Normocephalic and atraumatic.  Mouth/Throat: Oropharynx is clear and moist.  Eyes: Conjunctivae and EOM are normal. Pupils are equal, round, and reactive to light.  Neck: Normal range of motion. Neck supple. No tracheal deviation present.  Cardiovascular: Normal rate and regular rhythm.   Pulmonary/Chest: No stridor. Tachypnea noted. She has decreased breath sounds. She has no wheezes. She has no rhonchi. She has no rales. She exhibits no tenderness.  Abdominal: Soft. Bowel sounds are normal. There is no tenderness. There is no rebound and no guarding.  Musculoskeletal: Normal range of motion. She exhibits no edema or tenderness.  Lymphadenopathy:    She has no cervical adenopathy.  Neurological: She is alert and oriented to person, place, and time. She has normal reflexes.  Lower DTR intact  Skin: Skin is warm and dry.  Psychiatric: She has a normal mood and affect. Her behavior is normal.  Nursing note and vitals reviewed.   ED Course  Procedures (including critical care time) DIAGNOSTIC STUDIES: Oxygen Saturation is 100% on RA, normal by my interpretation.    COORDINATION OF CARE: 12:08 PM- Pt advised of plan for treatment and pt agrees.    Labs Review Labs Reviewed  URINALYSIS, ROUTINE W REFLEX MICROSCOPIC - Abnormal; Notable for the following:    APPearance CLOUDY (*)    Specific Gravity, Urine 1.031 (*)    Ketones, ur 15 (*)    Leukocytes, UA TRACE (*)    All other components within normal limits  CBC WITH DIFFERENTIAL - Abnormal; Notable for the following:    WBC 14.1 (*)    Neutro Abs 10.0 (*)    All other components within normal limits  URINE MICROSCOPIC-ADD ON - Abnormal; Notable for the following:    Squamous Epithelial / LPF FEW (*)    Bacteria, UA FEW (*)     All other components within normal limits  I-STAT CHEM 8, ED - Abnormal; Notable for the following:    Hemoglobin 16.0 (*)    HCT 47.0 (*)    All other components within normal limits  D-DIMER, QUANTITATIVE  POC URINE PREG, ED    Imaging Review Dg Chest Portable 1 View  01/12/2014   CLINICAL DATA:  Shortness of breath.  History of asthma.  EXAM: PORTABLE CHEST - 1 VIEW  COMPARISON:  01/19/2013  FINDINGS: Normal heart size and mediastinal contours. No acute infiltrate or edema. No effusion or pneumothorax. No acute osseous findings.  IMPRESSION: Negative for pneumonia or air leak.   Electronically Signed   By: Jorje Guild M.D.   On: 01/12/2014 01:31     EKG Interpretation   Date/Time:  Tuesday January 12 2014 00:16:42 EST Ventricular Rate:  115 PR Interval:  124 QRS Duration: 85 QT Interval:  312 QTC Calculation: 431 R Axis:   34 Text Interpretation:  Sinus tachycardia Confirmed by [facility]  MD,  Janelli Welling ([id number]) on 01/12/2014 12:20:21 AM      MDM   Final diagnoses:  Asthma   The allergy to PO steroids.  She states she will not take them at home and only gets IV here and goes home.  Is out of her inhaler and still smoking.  Advised to stop smoking immediately given her history.   Pt states she cannot get oral steroids but can have IV. She cannot have oral steroids at home. Will refill pt's Albuterol and Advair.  Medications  albuterol (PROVENTIL) (2.5 MG/3ML) 0.083% nebulizer solution 5 mg (5 mg Nebulization Given 01/11/14 2357)  methylPREDNISolone sodium succinate (SOLU-MEDROL) 125 mg/2 mL injection 125 mg (125 mg Intravenous Given 01/12/14 0049)  albuterol (PROVENTIL) (2.5 MG/3ML) 0.083% nebulizer solution 5 mg (5 mg Nebulization Given 01/12/14 0114)     I personally performed the services described in this documentation, which was scribed in my presence. The recorded information has been reviewed and is accurate.    Carlisle Beers, MD 01/12/14 [phone]

## 2014-01-14 ENCOUNTER — Emergency Department (HOSPITAL_COMMUNITY)
Admission: EM | Admit: 2014-01-14 | Discharge: 2014-01-14 | Disposition: A | Discharge status: Home / Self Care | Payer: Medicaid Other | Attending: Emergency Medicine | Admitting: Emergency Medicine

## 2014-01-14 ENCOUNTER — Encounter (HOSPITAL_COMMUNITY): Payer: Self-pay | Admitting: Emergency Medicine

## 2014-01-14 DIAGNOSIS — J45901 Unspecified asthma with (acute) exacerbation: Secondary | ICD-10-CM | POA: Diagnosis not present

## 2014-01-14 DIAGNOSIS — Z91040 Latex allergy status: Secondary | ICD-10-CM | POA: Diagnosis not present

## 2014-01-14 DIAGNOSIS — Z862 Personal history of diseases of the blood and blood-forming organs and certain disorders involving the immune mechanism: Secondary | ICD-10-CM | POA: Diagnosis not present

## 2014-01-14 DIAGNOSIS — Z7951 Long term (current) use of inhaled steroids: Secondary | ICD-10-CM | POA: Insufficient documentation

## 2014-01-14 DIAGNOSIS — Z880 Allergy status to penicillin: Secondary | ICD-10-CM | POA: Diagnosis not present

## 2014-01-14 DIAGNOSIS — Z79899 Other long term (current) drug therapy: Secondary | ICD-10-CM | POA: Insufficient documentation

## 2014-01-14 DIAGNOSIS — J45909 Unspecified asthma, uncomplicated: Secondary | ICD-10-CM | POA: Diagnosis present

## 2014-01-14 MED ORDER — ALBUTEROL (5 MG/ML) CONTINUOUS INHALATION SOLN
10.0000 mg/h | INHALATION_SOLUTION | Freq: Once | RESPIRATORY_TRACT | Status: AC
  Administered 2014-01-14: 10 mg/h via RESPIRATORY_TRACT
  Filled 2014-01-14: qty 20

## 2014-01-14 MED ORDER — METHYLPREDNISOLONE SODIUM SUCC 125 MG IJ SOLR
125.0000 mg | Freq: Once | INTRAMUSCULAR | Status: AC
  Administered 2014-01-14: 125 mg via INTRAVENOUS
  Filled 2014-01-14: qty 2

## 2014-01-14 MED ORDER — SODIUM CHLORIDE 0.9 % IV BOLUS (SEPSIS)
250.0000 mL | Freq: Once | INTRAVENOUS | Status: AC
  Administered 2014-01-14: 250 mL via INTRAVENOUS

## 2014-01-14 MED ORDER — SODIUM CHLORIDE 0.9 % IV SOLN
INTRAVENOUS | Status: DC
  Administered 2014-01-14: 04:00:00 via INTRAVENOUS

## 2014-01-14 NOTE — ED Notes (Signed)
NAD at this time. Pt took all belonging home.

## 2014-01-14 NOTE — ED Provider Notes (Signed)
CSN: [id number]     Arrival date & time 01/14/14  0108 History  This chart was scribed for Fredia Sorrow, MD by Rayfield Citizen, ED Scribe. This patient was seen in room A02C/A02C and the patient's care was started at 2:27 AM.    Chief Complaint  Patient presents with  . Asthma   Patient is a 23 y.o. female presenting with asthma. The history is provided by the patient. No language interpreter was used.  Asthma This is a chronic problem. The current episode started more than 1 week ago. The problem occurs constantly. The problem has not changed since onset.Associated symptoms include shortness of breath. Pertinent negatives include no chest pain, no abdominal pain and no headaches. The symptoms are aggravated by walking and exertion. Nothing relieves the symptoms. Treatments tried: albuterol inhaler. The treatment provided mild relief.     HPI Comments: Rockelle Heuerman is a 23 y.o. female who presents to the Emergency Department complaining of asthma complications. She reports a dry cough beginning 12/21; she was seen here 12/23 and 12/29 for similar symptoms. She explains that her asthma always flares up when she has cough/cold symptoms. She reports chest tightness at present. She last used her albuterol inhaler at 00:30 this morning. She uses her inhaler "constantly" throughout the day.   Patient reports that she is allergic to Prednisone. She has taken Solumedrol without issue; she is unsure if she has taken Decadron in the past.   Past Medical History  Diagnosis Date  . Asthma   . Seizures   . Sickle cell trait    Past Surgical History  Procedure Laterality Date  . Nerve, tendon and artery repair Right 09/23/2012    Procedure: I&D and Repair As Necessary/Right Hand and Palm;  Surgeon: Roseanne Kaufman, MD;  Location: [facility];  Service: Orthopedics;  Laterality: Right;  . Tonsillectomy    . Hernia repair    . Cesarean section    . Hand surgery    . Appendectomy     No family history on  file. History  Substance Use Topics  . Smoking status: Never Smoker   . Smokeless tobacco: Never Used  . Alcohol Use: No   OB History    No data available     Review of Systems  Constitutional: Positive for diaphoresis (and "hot flashes"). Negative for fever and chills.  HENT: Negative for rhinorrhea and sore throat.   Respiratory: Positive for cough, chest tightness and shortness of breath.   Cardiovascular: Negative for chest pain and leg swelling.  Gastrointestinal: Negative for nausea, vomiting, abdominal pain and diarrhea.  Genitourinary: Negative for dysuria, frequency and hematuria.  Musculoskeletal: Negative for back pain.  Skin: Negative for rash.  Neurological: Negative for headaches.  Hematological: Does not bruise/bleed easily.  Psychiatric/Behavioral: Negative for confusion.    Allergies  Avocado; Cheese; Chocolate; Ibuprofen; Orange juice; Other; Peach; Peanuts; Pear; Prednisone; Raspberry; Tylenol; Amoxicillin; Doxycycline; Erythromycin; Ivp dye; Latex; and Penicillins  Home Medications   Prior to Admission medications   Medication Sig Start Date End Date Taking? Authorizing Provider  albuterol (PROVENTIL HFA;VENTOLIN HFA) 108 (90 BASE) MCG/ACT inhaler Inhale 1-2 puffs into the lungs every 6 (six) hours as needed for wheezing or shortness of breath. 01/12/14  Yes April K Palumbo-Rasch, MD  albuterol (PROVENTIL) (5 MG/ML) 0.5% nebulizer solution Take 2.5 mg by nebulization every 6 (six) hours as needed for wheezing or shortness of breath.   Yes Historical Provider, MD  Fluticasone-Salmeterol (ADVAIR DISKUS) 500-50 MCG/DOSE AEPB Inhale 1  puff into the lungs 2 (two) times daily. 01/12/14  Yes April K Palumbo-Rasch, MD  Fluticasone-Salmeterol (ADVAIR) 500-50 MCG/DOSE AEPB Inhale 1 puff into the lungs 2 (two) times daily.    Historical Provider, MD   BP 125/52 mmHg  Pulse 115  Temp(Src) 98.8 F (37.1 C) (Oral)  Resp 21  Ht 5\' 4"  (1.626 m)  Wt 150 lb (68.04 kg)   BMI 25.73 kg/m2  SpO2 98%  LMP 01/13/2014 Physical Exam  Constitutional: She is oriented to person, place, and time. She appears well-developed and well-nourished.  HENT:  Head: Normocephalic and atraumatic.  Mouth/Throat: Oropharynx is clear and moist. No oropharyngeal exudate.  Eyes: EOM are normal. Pupils are equal, round, and reactive to light.  Cardiovascular: Normal rate, regular rhythm and normal heart sounds.  Exam reveals no gallop and no friction rub.   No murmur heard. Pulmonary/Chest: Effort normal and breath sounds normal. No respiratory distress. She has no wheezes. She has no rales.  Limited air movement  Abdominal: Soft. Bowel sounds are normal. There is no tenderness. There is no rebound and no guarding.  Musculoskeletal: Normal range of motion. She exhibits no edema.  Neurological: She is alert and oriented to person, place, and time.  Skin: Skin is warm and dry. No rash noted.  Psychiatric: She has a normal mood and affect. Her behavior is normal.  Nursing note and vitals reviewed.   ED Course  Procedures   DIAGNOSTIC STUDIES: Oxygen Saturation is 97% on RA, normal by my interpretation.    COORDINATION OF CARE: 2:34 AM Discussed treatment plan with pt at bedside and pt agreed to plan.   Labs Review Labs Reviewed - No data to display  Imaging Review No results found.   EKG Interpretation None      MDM   Final diagnoses:  Asthma exacerbation   This is patient's third visit for the asthma and dry cough. Patient had chest x-ray which was negative on December 29. Patient with persistent dry cough. No true upper respiratory type symptoms. The cough may be related to the bronchospasm. However never had any wheezing here. Patient given continuous nebulizer breathing improved. Get some tachycardia from that. Patient states she is allergic to prednisone so given Solu-Medrol IV here. Patient we discharged home will return for any new or worse symptoms. Patient  will continue use her albuterol inhalers at home. Patient given a work note. Patient's oxygen saturation saturations have been of all in the upper 90s.   I personally performed the services described in this documentation, which was scribed in my presence. The recorded information has been reviewed and is accurate.       Fredia Sorrow, MD 01/14/14 [phone]

## 2014-01-14 NOTE — ED Notes (Signed)
Attempted to IV start x2

## 2014-01-14 NOTE — Discharge Instructions (Signed)
Asthma Asthma is a recurring condition in which the airways tighten and narrow. Asthma can make it difficult to breathe. It can cause coughing, wheezing, and shortness of breath. Asthma episodes, also called asthma attacks, range from minor to life-threatening. Asthma cannot be cured, but medicines and lifestyle changes can help control it. CAUSES Asthma is believed to be caused by inherited (genetic) and environmental factors, but its exact cause is unknown. Asthma may be triggered by allergens, lung infections, or irritants in the air. Asthma triggers are different for each person. Common triggers include:   Animal dander.  Dust mites.  Cockroaches.  Pollen from trees or grass.  Mold.  Smoke.  Air pollutants such as dust, household cleaners, hair sprays, aerosol sprays, paint fumes, strong chemicals, or strong odors.  Cold air, weather changes, and winds (which increase molds and pollens in the air).  Strong emotional expressions such as crying or laughing hard.  Stress.  Certain medicines (such as aspirin) or types of drugs (such as beta-blockers).  Sulfites in foods and drinks. Foods and drinks that may contain sulfites include dried fruit, potato chips, and sparkling grape juice.  Infections or inflammatory conditions such as the flu, a cold, or an inflammation of the nasal membranes (rhinitis).  Gastroesophageal reflux disease (GERD).  Exercise or strenuous activity. SYMPTOMS Symptoms may occur immediately after asthma is triggered or many hours later. Symptoms include:  Wheezing.  Excessive nighttime or early morning coughing.  Frequent or severe coughing with a common cold.  Chest tightness.  Shortness of breath. DIAGNOSIS  The diagnosis of asthma is made by a review of your medical history and a physical exam. Tests may also be performed. These may include:  Lung function studies. These tests show how much air you breathe in and out.  Allergy  tests.  Imaging tests such as X-rays. TREATMENT  Asthma cannot be cured, but it can usually be controlled. Treatment involves identifying and avoiding your asthma triggers. It also involves medicines. There are 2 classes of medicine used for asthma treatment:   Controller medicines. These prevent asthma symptoms from occurring. They are usually taken every day.  Reliever or rescue medicines. These quickly relieve asthma symptoms. They are used as needed and provide short-term relief. Your health care provider will help you create an asthma action plan. An asthma action plan is a written plan for managing and treating your asthma attacks. It includes a list of your asthma triggers and how they may be avoided. It also includes information on when medicines should be taken and when their dosage should be changed. An action plan may also involve the use of a device called a peak flow meter. A peak flow meter measures how well the lungs are working. It helps you monitor your condition. HOME CARE INSTRUCTIONS   Take medicines only as directed by your health care provider. Speak with your health care provider if you have questions about how or when to take the medicines.  Use a peak flow meter as directed by your health care provider. Record and keep track of readings.  Understand and use the action plan to help minimize or stop an asthma attack without needing to seek medical care.  Control your home environment in the following ways to help prevent asthma attacks:  Do not smoke. Avoid being exposed to secondhand smoke.  Change your heating and air conditioning filter regularly.  Limit your use of fireplaces and wood stoves.  Get rid of pests (such as roaches and  mice) and their droppings.  Throw away plants if you see mold on them.  Clean your floors and dust regularly. Use unscented cleaning products.  Try to have someone else vacuum for you regularly. Stay out of rooms while they are  being vacuumed and for a short while afterward. If you vacuum, use a dust mask from a hardware store, a double-layered or microfilter vacuum cleaner bag, or a vacuum cleaner with a HEPA filter.  Replace carpet with wood, tile, or vinyl flooring. Carpet can trap dander and dust.  Use allergy-proof pillows, mattress covers, and box spring covers.  Wash bed sheets and blankets every week in hot water and dry them in a dryer.  Use blankets that are made of polyester or cotton.  Clean bathrooms and kitchens with bleach. If possible, have someone repaint the walls in these rooms with mold-resistant paint. Keep out of the rooms that are being cleaned and painted.  Wash hands frequently. SEEK MEDICAL CARE IF:   You have wheezing, shortness of breath, or a cough even if taking medicine to prevent attacks.  The colored mucus you cough up (sputum) is thicker than usual.  Your sputum changes from clear or white to yellow, green, gray, or bloody.  You have any problems that may be related to the medicines you are taking (such as a rash, itching, swelling, or trouble breathing).  You are using a reliever medicine more than 2-3 times per week.  Your peak flow is still at 50-79% of your personal best after following your action plan for 1 hour.  You have a fever. SEEK IMMEDIATE MEDICAL CARE IF:   You seem to be getting worse and are unresponsive to treatment during an asthma attack.  You are short of breath even at rest.  You get short of breath when doing very little physical activity.  You have difficulty eating, drinking, or talking due to asthma symptoms.  You develop chest pain.  You develop a fast heartbeat.  You have a bluish color to your lips or fingernails.  You are light-headed, dizzy, or faint.  Your peak flow is less than 50% of your personal best. MAKE SURE YOU:   Understand these instructions.  Will watch your condition.  Will get help right away if you are not  doing well or get worse. Document Released: 01/01/2005 Document Revised: 05/18/2013 Document Reviewed: 07/31/2012 [facility] Patient Information 2015 [place], [place]. This information is not intended to replace advice given to you by your health care provider. Make sure you discuss any questions you have with your health care provider.  Continue usual albuterol inhaler 2 puffs every 6 hours at least can do it more often if needed. Resource guide provided below to help you have a place to follow-up. May want to consider following up with the wellness clinic. Return for any new or worse symptoms. Work note provided.    Emergency Department Resource Guide 1) Find a Doctor and Pay Out of Pocket Although you won't have to find out who is covered by your insurance plan, it is a good idea to ask around and get recommendations. You will then need to call the office and see if the doctor you have chosen will accept you as a new patient and what types of options they offer for patients who are self-pay. Some doctors offer discounts or will set up payment plans for their patients who do not have insurance, but you will need to ask so you aren't surprised when you  get to your appointment.  2) Contact Your Local Health Department Not all health departments have doctors that can see patients for sick visits, but many do, so it is worth a call to see if yours does. If you don't know where your local health department is, you can check in your phone book. The CDC also has a tool to help you locate your state's health department, and many state websites also have listings of all of their local health departments.  3) Find a [facility] If your illness is not likely to be very severe or complicated, you may want to try a walk in clinic. These are popping up all over the country in pharmacies, drugstores, and shopping centers. They're usually staffed by nurse practitioners or physician assistants that have been  trained to treat common illnesses and complaints. They're usually fairly quick and inexpensive. However, if you have serious medical issues or chronic medical problems, these are probably not your best option.  No Primary Care Doctor: - Call Health Connect at  [phone] - they can help you locate a primary care doctor that  accepts your insurance, provides certain services, etc. - Physician Referral Service- [phone]  Chronic Pain Problems: Organization         Address  Phone   Notes  [facility]  [phone] Patients need to be referred by their primary care doctor.   Medication Assistance: Organization         Address  Phone   Notes  [facility] Medication [facility] [address] [place], [place] [address] [phone] --Must be a resident of [facility] -- Must have NO insurance coverage whatsoever (no Medicaid/ Medicare, etc.) -- The pt. MUST have a primary care doctor that directs their care regularly and follows them in the community   MedAssist  [phone]   [employer]  [phone]    Agencies that provide inexpensive medical care: Organization         Address  Phone   Notes  [place]  [phone]   [facility]    [phone]   [facility] [place], [place] [phone]   [place] [address], [place] [phone]   Planned Parenthood    [phone]   [facility]    [phone]   [place] and [place] [address], [place] Phone:  [phone], Fax:  [phone] Hours of Operation:  9 am - 6 pm, M-F.  Also accepts Medicaid/Medicare and self-pay.  [facility] for [place] [address], [address], [place] Phone: [phone], Fax: [phone]. Hours of Operation:  8:30 am - 5:30 pm, M-F.  Also accepts Medicaid and self-pay.   [facility] [place] [address] [place] Phone: [phone]   [place], [place], [place] [phone], Ext. [phone] Mondays & Thursdays: 7-9 AM.  First 15 patients are seen on a first come, first serve basis.    Peter Providers:  Organization         Address  Phone   Notes  [facility] [address], [place] [phone] Also accepts self-pay patients.  [place], [place]  [phone]   [facility]  [address] [place] [phone]   [place] [address], [place] [phone]   Lucianne Lei [address], [place]   [phone] Only accepts [place] Access [place] patients after they have their name applied to their card.   Self-Pay (no insurance) in [facility]:  Organization         Address  Phone   Notes  Sickle Cell Patients, [facility] Internal Medicine [place] [phone]   [facility] Urgent Care [place] [phone]   [facility] [place]  [place], [place], [place] [phone]   [facility]/Dr. Osei-Bonsu  [address] [place] or [address], [place] [phone] Phone number for both [place] and [place] locations is the same.  Urgent Medical and [facility] [address] [place] [phone]   [facility] [address] [place] or [address] [phone]   [facility] [address], [place] [phone], phone; [phone], fax Sees patients 1st and 3rd Saturday of every month.  Must not qualify for public or private insurance (i.e. Medicaid, Medicare, [place] Health Choice, Veterans' Benefits)  Household income should be no  more than 200% of the poverty level The clinic cannot treat you if you are pregnant or think you are pregnant  Sexually transmitted diseases are not treated at the clinic.    Dental Care: Organization         Address  Phone  Notes  [facility] Department of [facility] [place] [phone] Accepts children up to age 60 who are enrolled in [place] or [place]; pregnant women with a Medicaid card; and children who have applied for Medicaid or [place] Health Choice, but were declined, whose parents can pay a reduced fee at time of service.  [facility] Department of [facility]  [address], [place] [phone] Accepts children up to age 36 who are enrolled in [place] or [place]; pregnant women with a Medicaid card; and children who have applied for Medicaid or [place] Health Choice, but were declined, whose parents can pay a reduced fee at time of service.  [place] Adult Dental Access PROGRAM  [place] [phone] Patients are seen by appointment only. Walk-ins are not accepted. [place] will see patients 41 years of age and older. Monday - Tuesday (8am-5pm) Most Wednesdays (8:30-5pm) $30 per visit, cash only  [facility] Adult Dental Access PROGRAM  [address], [facility] [phone] Patients are seen by appointment only. Walk-ins are not accepted. [place] will see patients 21 years of age and older. One Wednesday Evening (Monthly: Volunteer Based).  $30 per visit, cash only  [place]  [phone] for adults; Children under age 35, call Graduate Pediatric Dentistry at [phone]. Children aged 58-14, please call [phone] to request a pediatric application.  Dental services are provided in all areas of dental care including fillings, crowns and bridges, complete and partial dentures, implants, gum treatment, root canals,  and extractions. Preventive care is also provided. Treatment is provided to both adults and children. Patients are selected via a lottery and there is often a waiting list.   [facility] [address], [place]  [phone] [email or web address]   [facility] [address] [place], [place] [phone], Ext. [phone] Second and Fourth Thursday of each month, opens at 6:30 AM; Clinic ends at 9 AM.  Patients are seen on a first-come first-served basis, and a limited number are seen during each clinic.   [facility]  [address] Hillard Danker [place], [place] [phone]   Eligibility Requirements You must have lived in [place], [place], or [place] counties for at least the last three months.   You cannot be eligible for state or federal sponsored [employer], including [employer], [place], or [employer].   You generally cannot be eligible for healthcare insurance through your employer.    How to apply: Eligibility screenings are held every Tuesday and Wednesday afternoon from 1:00 pm until 4:00 pm. You do not need an appointment for the interview!  [facility] [address], [place], [place]   [place]  [place] Department  [place]  [phone]    Behavioral Health Resources in the Community: Intensive Outpatient Programs Organization         Address  Phone  Notes  [place]. [address] [place], [place] [phone]   [facility] Outpatient [address] [place], [place]   ADS: Alcohol & Drug S[facility]cs [address] [place], [place]   [place] [address]  [place], [place] or [phone]   Substance Abuse Resources Organization         Address  Phone  Notes  Alcohol and Drug Services  [phone]    [place]  [phone]   [facility] [place]   Chinita Pester  [phone]   Residential & Outpatient Substance Abuse Program  [phone]   Psychological Services Organization         Address  Phone  Notes  [facility] [place]  [place]  [phone]   [place] [address] [place] or [phone]    Mobile Crisis Teams Organization         Address  Phone  Notes  [facility], Mobile Crisis Care Unit  [phone]   [facility]  [address] [place], [place]   [place] [address], [place] [phone]    Self-Help/Support Groups Organization         Address  Phone             Notes  [place]. of [place] - variety of support groups  [place] Call for more information  Narcotics Anonymous (NA), [facility] [address], [employer] [place]  2 meetings at this location   Special educational needs teacher         Address  Phone  Notes  [facility] Residential Treatment [place],    [place]  [phone]   [facility]  [address] [place] [phone], [place], [place]   [address] [place], [place] [phone] Admissions: 8am-3pm M-F  Incentives Substance [place] [address]    [place], [place] [phone]   [facility] [address] Jadene Pierini [place], [place]   [place].,  [place],  [place] - Intensive Outpatient [address] Krist[address], [place], [place]   [facility] ([place].) [place].,  [place], [place] [phone] or [phone]   [facility]idential Treatment Services (RTS) [address] [place], [place] Accepts Medicaid  [facility] [place] [address]   [place] [phone] Substance Abuse/Addiction Treatment   [facility] Organization         Address  Phone  Notes  [facility]  [phone]   Domenic Schwab, PhD [address] [place] [address] Arlis Porta [place], [place]   [phone] or [phone]   [place], [place] [phone]   [address], [place], [place] [phone] Insurance/Medicaid/sponsorship through [facility] and Families [address] [place]                                    [place], [place] [phone] [place] [address] [place], [place] [phone]    Dr. Adele Schilder  [phone]   [facility] Dept. 1) [address] [place] 2) [place] 3)  [place] [address], [place] [phone]  [phone]   [place] [phone] or [phone] (After Hours)

## 2014-01-14 NOTE — ED Notes (Signed)
Pt. reports asthma attack onset this week  with dry cough seen here 2 days ago with the same complaint discharged home with prescription inhaler with no relief.

## 2014-01-19 ENCOUNTER — Emergency Department (HOSPITAL_COMMUNITY)
Admission: EM | Admit: 2014-01-19 | Discharge: 2014-01-19 | Disposition: A | Discharge status: Home / Self Care | Payer: Medicaid Other | Attending: Emergency Medicine | Admitting: Emergency Medicine

## 2014-01-19 ENCOUNTER — Encounter (HOSPITAL_COMMUNITY): Payer: Self-pay | Admitting: *Deleted

## 2014-01-19 DIAGNOSIS — Z79899 Other long term (current) drug therapy: Secondary | ICD-10-CM | POA: Insufficient documentation

## 2014-01-19 DIAGNOSIS — R5383 Other fatigue: Secondary | ICD-10-CM | POA: Diagnosis not present

## 2014-01-19 DIAGNOSIS — J45901 Unspecified asthma with (acute) exacerbation: Secondary | ICD-10-CM | POA: Diagnosis not present

## 2014-01-19 DIAGNOSIS — G40909 Epilepsy, unspecified, not intractable, without status epilepticus: Secondary | ICD-10-CM | POA: Diagnosis not present

## 2014-01-19 DIAGNOSIS — J069 Acute upper respiratory infection, unspecified: Secondary | ICD-10-CM | POA: Diagnosis not present

## 2014-01-19 DIAGNOSIS — R609 Edema, unspecified: Secondary | ICD-10-CM | POA: Diagnosis not present

## 2014-01-19 DIAGNOSIS — Z862 Personal history of diseases of the blood and blood-forming organs and certain disorders involving the immune mechanism: Secondary | ICD-10-CM | POA: Insufficient documentation

## 2014-01-19 DIAGNOSIS — Z880 Allergy status to penicillin: Secondary | ICD-10-CM | POA: Insufficient documentation

## 2014-01-19 DIAGNOSIS — Z91040 Latex allergy status: Secondary | ICD-10-CM | POA: Insufficient documentation

## 2014-01-19 DIAGNOSIS — Z7951 Long term (current) use of inhaled steroids: Secondary | ICD-10-CM | POA: Diagnosis not present

## 2014-01-19 DIAGNOSIS — R05 Cough: Secondary | ICD-10-CM | POA: Diagnosis present

## 2014-01-19 MED ORDER — IPRATROPIUM BROMIDE 0.02 % IN SOLN
0.5000 mg | Freq: Once | RESPIRATORY_TRACT | Status: AC
  Administered 2014-01-19: 0.5 mg via RESPIRATORY_TRACT
  Filled 2014-01-19: qty 2.5

## 2014-01-19 MED ORDER — ALBUTEROL SULFATE (2.5 MG/3ML) 0.083% IN NEBU
5.0000 mg | INHALATION_SOLUTION | Freq: Once | RESPIRATORY_TRACT | Status: AC
  Administered 2014-01-19: 5 mg via RESPIRATORY_TRACT
  Filled 2014-01-19: qty 6

## 2014-01-19 NOTE — ED Notes (Signed)
Patient presents stating she has body aches and nasal and chest congestion

## 2014-01-19 NOTE — ED Notes (Signed)
Pt a/o x 4 on d/c with steady gait. 

## 2014-01-19 NOTE — ED Provider Notes (Signed)
CSN: [id number]     Arrival date & time 01/19/14  0142 History   First MD Initiated Contact with Patient 01/19/14 0304     Chief Complaint  Patient presents with  . Cough  . Nasal Congestion     (Consider location/radiation/quality/duration/timing/severity/associated sxs/prior Treatment) Patient is a 24 y.o. female presenting with cough. The history is provided by the patient. No language interpreter was used.  Cough Cough characteristics:  Non-productive Severity:  Moderate Associated symptoms: myalgias   Associated symptoms: no fever and no rash   Associated symptoms comment:  Patient with a history of asthma, current smoker, returns to the ED with complaint of cough, congestion, "cold symptoms". No fever. She denies vomiting.    Past Medical History  Diagnosis Date  . Asthma   . Seizures   . Sickle cell trait    Past Surgical History  Procedure Laterality Date  . Nerve, tendon and artery repair Right 09/23/2012    Procedure: I&D and Repair As Necessary/Right Hand and Palm;  Surgeon: Roseanne Kaufman, MD;  Location: [facility];  Service: Orthopedics;  Laterality: Right;  . Tonsillectomy    . Hernia repair    . Cesarean section    . Hand surgery    . Appendectomy     No family history on file. History  Substance Use Topics  . Smoking status: Never Smoker   . Smokeless tobacco: Never Used  . Alcohol Use: Yes   OB History    No data available     Review of Systems  Constitutional: Positive for fatigue. Negative for fever.  HENT: Positive for congestion.   Respiratory: Positive for cough.   Gastrointestinal: Negative for nausea and vomiting.  Musculoskeletal: Positive for myalgias.  Skin: Negative for rash.      Allergies  Avocado; Cheese; Chocolate; Ibuprofen; Orange juice; Other; Peach; Peanuts; Pear; Prednisone; Raspberry; Tylenol; Amoxicillin; Doxycycline; Erythromycin; Ivp dye; Latex; and Penicillins  Home Medications   Prior to Admission medications    Medication Sig Start Date End Date Taking? Authorizing Provider  albuterol (PROVENTIL HFA;VENTOLIN HFA) 108 (90 BASE) MCG/ACT inhaler Inhale 1-2 puffs into the lungs every 6 (six) hours as needed for wheezing or shortness of breath. 01/12/14  Yes April K Palumbo-Rasch, MD  carbamazepine (TEGRETOL XR) 400 MG 12 hr tablet Take 400 mg by mouth 2 (two) times daily.   Yes Historical Provider, MD  Fluticasone-Salmeterol (ADVAIR DISKUS) 500-50 MCG/DOSE AEPB Inhale 1 puff into the lungs 2 (two) times daily. 01/12/14  Yes April K Palumbo-Rasch, MD  oxyCODONE (OXY IR/ROXICODONE) 5 MG immediate release tablet Take 5 mg by mouth every 4 (four) hours as needed for severe pain.   Yes Historical Provider, MD  albuterol (PROVENTIL) (5 MG/ML) 0.5% nebulizer solution Take 2.5 mg by nebulization every 6 (six) hours as needed for wheezing or shortness of breath.    Historical Provider, MD  Fluticasone-Salmeterol (ADVAIR) 500-50 MCG/DOSE AEPB Inhale 1 puff into the lungs 2 (two) times daily.    Historical Provider, MD   BP 114/49 mmHg  Pulse 68  Temp(Src) 98.3 F (36.8 C) (Oral)  Resp 17  Ht 5\' 4"  (1.626 m)  Wt 160 lb (72.576 kg)  BMI 27.45 kg/m2  SpO2 98%  LMP 01/13/2014 Physical Exam  Constitutional: She is oriented to person, place, and time. She appears well-developed and well-nourished.  HENT:  Head: Normocephalic.  Nose: Mucosal edema present.  Mouth/Throat: Mucous membranes are normal. Posterior oropharyngeal erythema present. No posterior oropharyngeal edema.  Neck: Normal range  of motion. Neck supple.  Cardiovascular: Normal rate and regular rhythm.   Pulmonary/Chest: Effort normal. She has wheezes. She exhibits no tenderness.  Abdominal: Soft. Bowel sounds are normal. There is no tenderness. There is no rebound and no guarding.  Musculoskeletal: Normal range of motion.  Neurological: She is alert and oriented to person, place, and time.  Skin: Skin is warm and dry. No rash noted.  Psychiatric:  She has a normal mood and affect.    ED Course  Procedures (including critical care time) Labs Review Labs Reviewed - No data to display  Imaging Review No results found.   EKG Interpretation None      MDM   Final diagnoses:  None    1. URI  She has inhaler and nebulizer medications at home. Will provide Rx for prednisone for 3 days. Encouraged outpatient follow up iwht PCP.    Dewaine Oats, PA-C 01/19/14 [id number]  Janice Norrie, MD 01/19/14 [phone]

## 2014-01-19 NOTE — Discharge Instructions (Signed)
Upper Respiratory Infection, Adult An upper respiratory infection (URI) is also sometimes known as the common cold. The upper respiratory tract includes the nose, sinuses, throat, trachea, and bronchi. Bronchi are the airways leading to the lungs. Most people improve within 1 week, but symptoms can last up to 2 weeks. A residual cough may last even longer.  CAUSES Many different viruses can infect the tissues lining the upper respiratory tract. The tissues become irritated and inflamed and often become very moist. Mucus production is also common. A cold is contagious. You can easily spread the virus to others by oral contact. This includes kissing, sharing a glass, coughing, or sneezing. Touching your mouth or nose and then touching a surface, which is then touched by another person, can also spread the virus. SYMPTOMS  Symptoms typically develop 1 to 3 days after you come in contact with a cold virus. Symptoms vary from person to person. They may include:  Runny nose.  Sneezing.  Nasal congestion.  Sinus irritation.  Sore throat.  Loss of voice (laryngitis).  Cough.  Fatigue.  Muscle aches.  Loss of appetite.  Headache.  Low-grade fever. DIAGNOSIS  You might diagnose your own cold based on familiar symptoms, since most people get a cold 2 to 3 times a year. Your caregiver can confirm this based on your exam. Most importantly, your caregiver can check that your symptoms are not due to another disease such as strep throat, sinusitis, pneumonia, asthma, or epiglottitis. Blood tests, throat tests, and X-rays are not necessary to diagnose a common cold, but they may sometimes be helpful in excluding other more serious diseases. Your caregiver will decide if any further tests are required. RISKS AND COMPLICATIONS  You may be at risk for a more severe case of the common cold if you smoke cigarettes, have chronic heart disease (such as heart failure) or lung disease (such as asthma), or if  you have a weakened immune system. The very young and very old are also at risk for more serious infections. Bacterial sinusitis, middle ear infections, and bacterial pneumonia can complicate the common cold. The common cold can worsen asthma and chronic obstructive pulmonary disease (COPD). Sometimes, these complications can require emergency medical care and may be life-threatening. PREVENTION  The best way to protect against getting a cold is to practice good hygiene. Avoid oral or hand contact with people with cold symptoms. Wash your hands often if contact occurs. There is no clear evidence that vitamin C, vitamin E, echinacea, or exercise reduces the chance of developing a cold. However, it is always recommended to get plenty of rest and practice good nutrition. TREATMENT  Treatment is directed at relieving symptoms. There is no cure. Antibiotics are not effective, because the infection is caused by a virus, not by bacteria. Treatment may include:  Increased fluid intake. Sports drinks offer valuable electrolytes, sugars, and fluids.  Breathing heated mist or steam (vaporizer or shower).  Eating chicken soup or other clear broths, and maintaining good nutrition.  Getting plenty of rest.  Using gargles or lozenges for comfort.  Controlling fevers with ibuprofen or acetaminophen as directed by your caregiver.  Increasing usage of your inhaler if you have asthma. Zinc gel and zinc lozenges, taken in the first 24 hours of the common cold, can shorten the duration and lessen the severity of symptoms. Pain medicines may help with fever, muscle aches, and throat pain. A variety of non-prescription medicines are available to treat congestion and runny nose. Your caregiver   can make recommendations and may suggest nasal or lung inhalers for other symptoms.  HOME CARE INSTRUCTIONS   Only take over-the-counter or prescription medicines for pain, discomfort, or fever as directed by your  caregiver.  Use a warm mist humidifier or inhale steam from a shower to increase air moisture. This may keep secretions moist and make it easier to breathe.  Drink enough water and fluids to keep your urine clear or pale yellow.  Rest as needed.  Return to work when your temperature has returned to normal or as your caregiver advises. You may need to stay home longer to avoid infecting others. You can also use a face mask and careful hand washing to prevent spread of the virus. SEEK MEDICAL CARE IF:   After the first few days, you feel you are getting worse rather than better.  You need your caregiver's advice about medicines to control symptoms.  You develop chills, worsening shortness of breath, or brown or red sputum. These may be signs of pneumonia.  You develop yellow or brown nasal discharge or pain in the face, especially when you bend forward. These may be signs of sinusitis.  You develop a fever, swollen neck glands, pain with swallowing, or white areas in the back of your throat. These may be signs of strep throat. SEEK IMMEDIATE MEDICAL CARE IF:   You have a fever.  You develop severe or persistent headache, ear pain, sinus pain, or chest pain.  You develop wheezing, a prolonged cough, cough up blood, or have a change in your usual mucus (if you have chronic lung disease).  You develop sore muscles or a stiff neck. Document Released: 06/27/2000 Document Revised: 03/26/2011 Document Reviewed: 04/08/2013 Exit[employer] Patient Information 2015 [employer]. This information is not intended to replace advice given to you by your health care provider. Make sure you discuss any questions you have with your health care provider.  

## 2014-03-23 ENCOUNTER — Inpatient Hospital Stay (HOSPITAL_COMMUNITY)
Admission: AD | Admit: 2014-03-23 | Discharge: 2014-03-24 | Disposition: A | Discharge status: Home / Self Care | Payer: Medicaid Other | Source: Ambulatory Visit | Attending: Family Medicine | Admitting: Family Medicine

## 2014-03-23 ENCOUNTER — Encounter (HOSPITAL_COMMUNITY): Payer: Self-pay | Admitting: Emergency Medicine

## 2014-03-23 DIAGNOSIS — R109 Unspecified abdominal pain: Secondary | ICD-10-CM

## 2014-03-23 DIAGNOSIS — R12 Heartburn: Secondary | ICD-10-CM | POA: Insufficient documentation

## 2014-03-23 DIAGNOSIS — O26899 Other specified pregnancy related conditions, unspecified trimester: Secondary | ICD-10-CM

## 2014-03-23 DIAGNOSIS — F17210 Nicotine dependence, cigarettes, uncomplicated: Secondary | ICD-10-CM | POA: Diagnosis not present

## 2014-03-23 DIAGNOSIS — O9989 Other specified diseases and conditions complicating pregnancy, childbirth and the puerperium: Secondary | ICD-10-CM | POA: Insufficient documentation

## 2014-03-23 DIAGNOSIS — R197 Diarrhea, unspecified: Secondary | ICD-10-CM | POA: Diagnosis not present

## 2014-03-23 DIAGNOSIS — Z3A01 Less than 8 weeks gestation of pregnancy: Secondary | ICD-10-CM | POA: Diagnosis not present

## 2014-03-23 DIAGNOSIS — O26891 Other specified pregnancy related conditions, first trimester: Secondary | ICD-10-CM

## 2014-03-23 DIAGNOSIS — O219 Vomiting of pregnancy, unspecified: Secondary | ICD-10-CM

## 2014-03-23 DIAGNOSIS — R1110 Vomiting, unspecified: Secondary | ICD-10-CM | POA: Diagnosis present

## 2014-03-23 DIAGNOSIS — R1013 Epigastric pain: Secondary | ICD-10-CM | POA: Insufficient documentation

## 2014-03-23 DIAGNOSIS — O99331 Smoking (tobacco) complicating pregnancy, first trimester: Secondary | ICD-10-CM | POA: Insufficient documentation

## 2014-03-23 HISTORY — DX: Acute pancreatitis without necrosis or infection, unspecified: K85.90

## 2014-03-23 HISTORY — DX: Cardiac murmur, unspecified: R01.1

## 2014-03-23 LAB — COMPREHENSIVE METABOLIC PANEL
ALBUMIN: 4.2 g/dL (ref 3.5–5.2)
ALT: 28 U/L (ref 0–35)
AST: 24 U/L (ref 0–37)
Alkaline Phosphatase: 49 U/L (ref 39–117)
Anion gap: 6 (ref 5–15)
BUN: 7 mg/dL (ref 6–23)
CHLORIDE: 104 mmol/L (ref 96–112)
CO2: 24 mmol/L (ref 19–32)
CREATININE: 0.65 mg/dL (ref 0.50–1.10)
Calcium: 9 mg/dL (ref 8.4–10.5)
GFR calc Af Amer: >90 mL/min (ref >90)
GFR calc non Af Amer: >90 mL/min (ref >90)
Glucose, Bld: 90 mg/dL (ref 70–99)
Potassium: 3.6 mmol/L (ref 3.5–5.1)
Sodium: 134 mmol/L — ABNORMAL LOW (ref 135–145)
Total Bilirubin: 0.4 mg/dL (ref 0.3–1.2)
Total Protein: 7.3 g/dL (ref 6.0–8.3)

## 2014-03-23 LAB — CBC WITH DIFFERENTIAL/PLATELET
BASOS PCT: 0 % (ref 0–1)
Basophils Absolute: 0 10*3/uL (ref 0.0–0.1)
EOS ABS: 0.2 10*3/uL (ref 0.0–0.7)
Eosinophils Relative: 2 % (ref 0–5)
HCT: 39.3 % (ref 36.0–46.0)
HEMOGLOBIN: 13.3 g/dL (ref 12.0–15.0)
Lymphocytes Relative: 22 % (ref 12–46)
Lymphs Abs: 2.4 10*3/uL (ref 0.7–4.0)
MCH: 29.4 pg (ref 26.0–34.0)
MCHC: 33.8 g/dL (ref 30.0–36.0)
MCV: 86.8 fL (ref 78.0–100.0)
Monocytes Absolute: 0.6 10*3/uL (ref 0.1–1.0)
Monocytes Relative: 5 % (ref 3–12)
NEUTROS ABS: 7.7 10*3/uL (ref 1.7–7.7)
Neutrophils Relative %: 71 % (ref 43–77)
PLATELETS: 246 10*3/uL (ref 150–400)
RBC: 4.53 MIL/uL (ref 3.87–5.11)
RDW: 13.5 % (ref 11.5–15.5)
WBC: 10.9 10*3/uL — ABNORMAL HIGH (ref 4.0–10.5)

## 2014-03-23 LAB — LIPASE, BLOOD: Lipase: 21 U/L (ref 11–59)

## 2014-03-23 LAB — POC URINE PREG, ED: Preg Test, Ur: POSITIVE — AB

## 2014-03-23 MED ORDER — SODIUM CHLORIDE 0.9 % IV BOLUS (SEPSIS)
1000.0000 mL | Freq: Once | INTRAVENOUS | Status: AC
  Administered 2014-03-23: 1000 mL via INTRAVENOUS

## 2014-03-23 MED ORDER — ONDANSETRON HCL 4 MG/2ML IJ SOLN
4.0000 mg | Freq: Once | INTRAMUSCULAR | Status: AC
  Administered 2014-03-23: 4 mg via INTRAVENOUS
  Filled 2014-03-23: qty 2

## 2014-03-23 MED ORDER — HYDROMORPHONE HCL 1 MG/ML IJ SOLN
1.0000 mg | Freq: Once | INTRAMUSCULAR | Status: DC
  Filled 2014-03-23: qty 1

## 2014-03-23 MED ORDER — HYDROMORPHONE HCL 1 MG/ML IJ SOLN
1.0000 mg | Freq: Once | INTRAMUSCULAR | Status: AC
  Administered 2014-03-23: 1 mg via INTRAVENOUS
  Filled 2014-03-23: qty 1

## 2014-03-23 NOTE — ED Provider Notes (Signed)
CSN: [id number]     Arrival date & time 03/23/14  1759 History   First MD Initiated Contact with Patient 03/23/14 1905     Chief Complaint  Patient presents with  . Abdominal Pain  . Emesis  . Diarrhea     (Consider location/radiation/quality/duration/timing/severity/associated sxs/prior Treatment) Patient is a 24 y.o. female presenting with abdominal pain, vomiting, and diarrhea. The history is provided by the patient.  Abdominal Pain Pain location:  Epigastric Pain quality: aching   Pain radiates to:  Does not radiate Pain severity:  Moderate Onset quality:  Gradual Duration:  5 days Timing:  Constant Progression:  Unchanged Chronicity:  New Context: sick contacts   Relieved by:  Nothing Worsened by:  Nothing tried Associated symptoms: diarrhea, nausea and vomiting   Associated symptoms: no chest pain, no cough, no fever and no shortness of breath   Emesis Associated symptoms: abdominal pain and diarrhea   Diarrhea Associated symptoms: abdominal pain and vomiting   Associated symptoms: no fever     Past Medical History  Diagnosis Date  . Asthma   . Seizures   . Sickle cell trait   . Pancreatitis   . Heart murmur    Past Surgical History  Procedure Laterality Date  . Nerve, tendon and artery repair Right 09/23/2012    Procedure: I&D and Repair As Necessary/Right Hand and Palm;  Surgeon: Roseanne Kaufman, MD;  Location: [facility];  Service: Orthopedics;  Laterality: Right;  . Tonsillectomy    . Hernia repair    . Cesarean section    . Hand surgery    . Appendectomy     No family history on file. History  Substance Use Topics  . Smoking status: Current Every Day Smoker    Types: Cigarettes  . Smokeless tobacco: Never Used  . Alcohol Use: Yes   OB History    No data available     Review of Systems  Constitutional: Negative for fever.  Respiratory: Negative for cough and shortness of breath.   Cardiovascular: Negative for chest pain.  Gastrointestinal:  Positive for nausea, vomiting, abdominal pain and diarrhea.  All other systems reviewed and are negative.     Allergies  Avocado; Cheese; Chocolate; Ibuprofen; Orange juice; Other; Peach; Peanuts; Pear; Prednisone; Raspberry; Tylenol; Amoxicillin; Doxycycline; Erythromycin; Ivp dye; Latex; and Penicillins  Home Medications   Prior to Admission medications   Medication Sig Start Date End Date Taking? Authorizing Provider  albuterol (PROVENTIL HFA;VENTOLIN HFA) 108 (90 BASE) MCG/ACT inhaler Inhale 1-2 puffs into the lungs every 6 (six) hours as needed for wheezing or shortness of breath. 01/12/14  Yes April Palumbo, MD  Fluticasone-Salmeterol (ADVAIR DISKUS) 500-50 MCG/DOSE AEPB Inhale 1 puff into the lungs 2 (two) times daily. Patient not taking: Reported on 03/23/2014 01/12/14   April Palumbo, MD   BP 126/53 mmHg  Pulse 72  Temp(Src) 98.5 F (36.9 C) (Oral)  Resp 16  SpO2 100%  LMP 03/06/2014 Physical Exam  Constitutional: She is oriented to person, place, and time. She appears well-developed and well-nourished. No distress.  HENT:  Head: Normocephalic and atraumatic.  Mouth/Throat: Oropharynx is clear and moist.  Eyes: EOM are normal. Pupils are equal, round, and reactive to light.  Neck: Normal range of motion. Neck supple.  Cardiovascular: Normal rate and regular rhythm.  Exam reveals no friction rub.   No murmur heard. Pulmonary/Chest: Effort normal and breath sounds normal. No respiratory distress. She has no wheezes. She has no rales.  Abdominal: Soft. She exhibits  no distension. There is no tenderness. There is no rebound.  Musculoskeletal: Normal range of motion. She exhibits no edema.  Neurological: She is alert and oriented to person, place, and time.  Skin: Skin is warm. No rash noted. She is not diaphoretic.  Nursing note and vitals reviewed.   ED Course  Procedures (including critical care time) Labs Review Labs Reviewed  CBC WITH DIFFERENTIAL/PLATELET -  Abnormal; Notable for the following:    WBC 10.9 (*)    All other components within normal limits  COMPREHENSIVE METABOLIC PANEL - Abnormal; Notable for the following:    Sodium 134 (*)    All other components within normal limits  URINALYSIS, ROUTINE W REFLEX MICROSCOPIC - Abnormal; Notable for the following:    APPearance CLOUDY (*)    Ketones, ur >80 (*)    Leukocytes, UA MODERATE (*)    All other components within normal limits  URINE MICROSCOPIC-ADD ON - Abnormal; Notable for the following:    Squamous Epithelial / LPF MANY (*)    Bacteria, UA MANY (*)    All other components within normal limits  POC URINE PREG, ED - Abnormal; Notable for the following:    Preg Test, Ur POSITIVE (*)    All other components within normal limits  I-STAT BETA HCG BLOOD, ED ([facility], [facility], [facility] ONLY) - Abnormal; Notable for the following:    I-stat hCG, quantitative >2000.0 (*)    All other components within normal limits  LIPASE, BLOOD    Imaging Review No results found.   EKG Interpretation None      MDM   Final diagnoses:  Intractable vomiting with nausea, vomiting of unspecified type    83F here with 4 days of N/V/D. No fever. Sick contacts at work. Hx of pancreatitis. Upper abdominal pain. Denies fever. Unable to tolerate PO. AFVSS here. Mild epigastric pain. Will give fluids, anti-emetics, pain meds. Will check labs. Urine pregnancy test is positive. Labs okay. Unable to tolerate by mouth fluids on 2 separate occasions medication. Plan for admission over at [facility].  Patient does not want mother to know of her pregnancy at this time. Bacteruria treated with Rocephin.    Evelina Bucy, MD 03/24/14 [phone]

## 2014-03-23 NOTE — ED Notes (Signed)
Pt's visitor states that pt has been having abd pain, n/v/d x 4 days.  Pt unable to tolerate dry toast, water or juice bc everything comes back out or up. 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

## 2014-03-23 NOTE — ED Notes (Signed)
Pt attempted for UA.   Did not get enough for a sample.

## 2014-03-23 NOTE — ED Notes (Signed)
Pt in restroom vomiting

## 2014-03-24 ENCOUNTER — Encounter (HOSPITAL_COMMUNITY): Payer: Self-pay | Admitting: *Deleted

## 2014-03-24 ENCOUNTER — Inpatient Hospital Stay (HOSPITAL_COMMUNITY): Payer: Medicaid Other

## 2014-03-24 DIAGNOSIS — Z3A01 Less than 8 weeks gestation of pregnancy: Secondary | ICD-10-CM

## 2014-03-24 DIAGNOSIS — O218 Other vomiting complicating pregnancy: Secondary | ICD-10-CM

## 2014-03-24 LAB — URINALYSIS, ROUTINE W REFLEX MICROSCOPIC
Bilirubin Urine: NEGATIVE
Glucose, UA: NEGATIVE mg/dL
Hgb urine dipstick: NEGATIVE
Ketones, ur: >80 mg/dL — AB
Nitrite: NEGATIVE
Protein, ur: NEGATIVE mg/dL
SPECIFIC GRAVITY, URINE: 1.025 (ref 1.005–1.030)
Urobilinogen, UA: 1 mg/dL (ref 0.0–1.0)
pH: 7 (ref 5.0–8.0)

## 2014-03-24 LAB — I-STAT BETA HCG BLOOD, ED (MC, WL, AP ONLY)

## 2014-03-24 LAB — URINE MICROSCOPIC-ADD ON

## 2014-03-24 LAB — WET PREP, GENITAL
TRICH WET PREP: NONE SEEN
Yeast Wet Prep HPF POC: NONE SEEN

## 2014-03-24 MED ORDER — PROMETHAZINE HCL 25 MG PO TABS: 12.5000 mg | ORAL_TABLET | Freq: Four times a day (QID) | ORAL | Status: DC | PRN

## 2014-03-24 MED ORDER — MORPHINE SULFATE 2 MG/ML IJ SOLN
2.0000 mg | Freq: Once | INTRAMUSCULAR | Status: AC
  Administered 2014-03-24: 2 mg via INTRAVENOUS
  Filled 2014-03-24: qty 1

## 2014-03-24 MED ORDER — DEXTROSE 5 % IV SOLN
1.0000 g | Freq: Once | INTRAVENOUS | Status: DC
  Filled 2014-03-24: qty 10

## 2014-03-24 MED ORDER — RANITIDINE HCL 150 MG PO TABS: 150.0000 mg | ORAL_TABLET | Freq: Two times a day (BID) | ORAL | Status: DC

## 2014-03-24 MED ORDER — DEXTROSE-NACL 5-0.9 % IV SOLN
Freq: Once | INTRAVENOUS | Status: AC
  Administered 2014-03-24: 01:00:00 via INTRAVENOUS

## 2014-03-24 MED ORDER — NITROFURANTOIN MONOHYD MACRO 100 MG PO CAPS: 100.0000 mg | ORAL_CAPSULE | Freq: Two times a day (BID) | ORAL | Status: DC

## 2014-03-24 MED ORDER — LEVETIRACETAM 500 MG PO TABS: 500.0000 mg | ORAL_TABLET | Freq: Two times a day (BID) | ORAL | Status: DC

## 2014-03-24 MED ORDER — DEXTROSE IN LACTATED RINGERS 5 % IV SOLN: INTRAVENOUS | Status: DC

## 2014-03-24 NOTE — ED Notes (Signed)
[facility] called for transportation to [facility]

## 2014-03-24 NOTE — MAU Provider Note (Signed)
Chief Complaint: Abdominal Pain; Emesis; and Diarrhea   First Provider Initiated Contact with Patient 03/24/14 0232      SUBJECTIVE HPI: Jasmine [name] is a 24 y.o. G3P2 at unknown by LMP who presents to maternity admissions sent from [facility] reporting n/v/d x 3 days and upper abdominal/epigastric pain.  She also reports vaginal spotting 1 week ago. Her last normal period was in February but this is not unusual for her.  She denies vaginal itching/burning, urinary symptoms, h/a, dizziness, or fever/chills.    Her pregnancy test was positive at [facility] and pt is aware. She did not know she was pregnant until today.  Past Medical History  Diagnosis Date  . Asthma   . Seizures   . Sickle cell trait   . Pancreatitis   . Heart murmur    Past Surgical History  Procedure Laterality Date  . Nerve, tendon and artery repair Right 09/23/2012    Procedure: I&D and Repair As Necessary/Right Hand and Palm;  Surgeon: Roseanne Kaufman, MD;  Location: [place];  Service: Orthopedics;  Laterality: Right;  . Tonsillectomy    . Hernia repair    . Cesarean section    . Hand surgery    . Appendectomy     History   Social History  . Marital Status: Single    Spouse Name: N/A  . Number of Children: N/A  . Years of Education: N/A   Occupational History  . Not on file.   Social History Main Topics  . Smoking status: Current Every Day Smoker    Types: Cigarettes  . Smokeless tobacco: Never Used  . Alcohol Use: Yes  . Drug Use: No  . Sexual Activity: No   Other Topics Concern  . Not on file   Social History Narrative   No current facility-administered medications on file prior to encounter.   Current Outpatient Prescriptions on File Prior to Encounter  Medication Sig Dispense Refill  . albuterol (PROVENTIL HFA;VENTOLIN HFA) 108 (90 BASE) MCG/ACT inhaler Inhale 1-2 puffs into the lungs every 6 (six) hours as needed for wheezing or shortness of breath. 1 Inhaler 0  . Fluticasone-Salmeterol (ADVAIR  DISKUS) 500-50 MCG/DOSE AEPB Inhale 1 puff into the lungs 2 (two) times daily. (Patient not taking: Reported on 03/23/2014) 60 each 0   Allergies  Allergen Reactions  . Avocado Anaphylaxis  . Cheese Shortness Of Breath    Asthma trigger  . Chocolate Shortness Of Breath    Asthma trigger  . Ibuprofen Hives and Shortness Of Breath    Tolerates naproxen  . Orange Juice [Orange Oil] Shortness Of Breath  . Other Hives    ALLERGIC TO ALL STEROIDS       EXCEPT IV SOLU MEDROL  . Peach [Prunus Persica] Anaphylaxis  . Peanuts [Peanut Oil] Anaphylaxis  . Pear Anaphylaxis  . Prednisone Hives  . Raspberry Anaphylaxis  . Tylenol [Acetaminophen] Hives and Shortness Of Breath  . Amoxicillin Hives  . Doxycycline Hives  . Erythromycin Hives  . Ivp Dye [Iodinated Diagnostic Agents]     Shortness of breath   . Latex     Swelling/anaphylaxis  . Penicillins Hives    ROS: Pertinent items in HPI  OBJECTIVE Blood pressure 109/49, pulse 51, temperature 98.8 F (37.1 C), temperature source Oral, resp. rate 18, last menstrual period 03/06/2014, SpO2 100 %. GENERAL: Well-developed, well-nourished female in no acute distress.  HEENT: Normocephalic HEART: normal rate RESP: normal effort ABDOMEN: Soft, non-tender except mild tenderness in epigastric region, no rebound  tenderness, no guarding EXTREMITIES: Nontender, no edema NEURO: Alert and oriented Pelvic exam: Cervix pink, visually closed, without lesion, moderate amount thick yellow discharge, vaginal walls and external genitalia normal Bimanual exam: Cervix 0/long/high, firm, anterior, neg CMT, uterus nontender, nonenlarged, adnexa without tenderness, enlargement, or mass  LAB RESULTS Results for orders placed or performed during the hospital encounter of 03/23/14 (from the past 24 hour(s))  CBC with Differential     Status: Abnormal   Collection Time: 03/23/14  7:15 PM  Result Value Ref Range   WBC 10.9 (H) 4.0 - 10.5 K/uL   RBC 4.53 3.87 -  5.11 MIL/uL   Hemoglobin 13.3 12.0 - 15.0 g/dL   HCT 39.3 36.0 - 46.0 %   MCV 86.8 78.0 - 100.0 fL   MCH 29.4 26.0 - 34.0 pg   MCHC 33.8 30.0 - 36.0 g/dL   RDW 13.5 11.5 - 15.5 %   Platelets 246 150 - 400 K/uL   Neutrophils Relative % 71 43 - 77 %   Neutro Abs 7.7 1.7 - 7.7 K/uL   Lymphocytes Relative 22 12 - 46 %   Lymphs Abs 2.4 0.7 - 4.0 K/uL   Monocytes Relative 5 3 - 12 %   Monocytes Absolute 0.6 0.1 - 1.0 K/uL   Eosinophils Relative 2 0 - 5 %   Eosinophils Absolute 0.2 0.0 - 0.7 K/uL   Basophils Relative 0 0 - 1 %   Basophils Absolute 0.0 0.0 - 0.1 K/uL  Comprehensive metabolic panel     Status: Abnormal   Collection Time: 03/23/14  7:15 PM  Result Value Ref Range   Sodium 134 (L) 135 - 145 mmol/L   Potassium 3.6 3.5 - 5.1 mmol/L   Chloride 104 96 - 112 mmol/L   CO2 24 19 - 32 mmol/L   Glucose, Bld 90 70 - 99 mg/dL   BUN 7 6 - 23 mg/dL   Creatinine, Ser 0.65 0.50 - 1.10 mg/dL   Calcium 9.0 8.4 - 10.5 mg/dL   Total Protein 7.3 6.0 - 8.3 g/dL   Albumin 4.2 3.5 - 5.2 g/dL   AST 24 0 - 37 U/L   ALT 28 0 - 35 U/L   Alkaline Phosphatase 49 39 - 117 U/L   Total Bilirubin 0.4 0.3 - 1.2 mg/dL   GFR calc non Af Amer >90 >90 mL/min   GFR calc Af Amer >90 >90 mL/min   Anion gap 6 5 - 15  Lipase, blood     Status: None   Collection Time: 03/23/14  7:15 PM  Result Value Ref Range   Lipase 21 11 - 59 U/L  Urinalysis, Routine w reflex microscopic     Status: Abnormal   Collection Time: 03/23/14 11:39 PM  Result Value Ref Range   Color, Urine YELLOW YELLOW   APPearance CLOUDY (A) CLEAR   Specific Gravity, Urine 1.025 1.005 - 1.030   pH 7.0 5.0 - 8.0   Glucose, UA NEGATIVE NEGATIVE mg/dL   Hgb urine dipstick NEGATIVE NEGATIVE   Bilirubin Urine NEGATIVE NEGATIVE   Ketones, ur >80 (A) NEGATIVE mg/dL   Protein, ur NEGATIVE NEGATIVE mg/dL   Urobilinogen, UA 1.0 0.0 - 1.0 mg/dL   Nitrite NEGATIVE NEGATIVE   Leukocytes, UA MODERATE (A) NEGATIVE  Urine microscopic-add on      Status: Abnormal   Collection Time: 03/23/14 11:39 PM  Result Value Ref Range   Squamous Epithelial / LPF MANY (A) RARE   WBC, UA 21-50 <3 WBC/hpf  Bacteria, UA MANY (A) RARE   Urine-Other MUCOUS PRESENT   POC Urine Pregnancy, ED  (If Pre-menopausal female) - do not order at [facility]     Status: Abnormal   Collection Time: 03/23/14 11:46 PM  Result Value Ref Range   Preg Test, Ur POSITIVE (A) NEGATIVE  I-Stat Beta hCG blood, ED ([facility], [facility], [facility] only)     Status: Abnormal   Collection Time: 03/24/14 12:09 AM  Result Value Ref Range   I-stat hCG, quantitative >2000.0 (H) <5 mIU/mL   Comment 3          Wet prep, genital     Status: Abnormal   Collection Time: 03/24/14  2:25 AM  Result Value Ref Range   Yeast Wet Prep HPF POC NONE SEEN NONE SEEN   Trich, Wet Prep NONE SEEN NONE SEEN   Clue Cells Wet Prep HPF POC FEW (A) NONE SEEN   WBC, Wet Prep HPF POC MANY (A) NONE SEEN    IMAGING US Ob Comp Less 14 Wks  03/24/2014   CLINICAL DATA:  Abdominal pain in pregnancy. Unknown last menstrual period.  EXAM: OBSTETRIC <14 WK [place] AND TRANSVAGINAL OB US  TECHNIQUE: Both transabdominal and transvaginal ultrasound examinations were performed for complete evaluation of the gestation as well as the maternal uterus, adnexal regions, and pelvic cul-de-sac. Transvaginal technique was performed to assess early pregnancy.  COMPARISON:  None.  FINDINGS: Intrauterine gestational sac: Visualized/normal in shape.  Yolk sac:  Present  Embryo:  Present  Cardiac Activity: Present  Heart Rate: 125  bpm  CRL:  7  mm   6 w   4 d                  [place] EDC: November 13, 2014  Maternal uterus/adnexae: Small subchorionic hemorrhage. Normal appearance of the adnexal (probable corpus luteal cyst on the LEFT). Caesarean section scar. No free fluid.  IMPRESSION: Single live intrauterine pregnancy, gestational age [redacted] weeks and 4 days by ultrasound. Small subchorionic hemorrhage.   Electronically Signed   By: Elon Alas   On: 03/24/2014  03:41   US Ob Transvaginal  03/24/2014   CLINICAL DATA:  Abdominal pain in pregnancy. Unknown last menstrual period.  EXAM: OBSTETRIC <14 WK [place] AND TRANSVAGINAL OB US  TECHNIQUE: Both transabdominal and transvaginal ultrasound examinations were performed for complete evaluation of the gestation as well as the maternal uterus, adnexal regions, and pelvic cul-de-sac. Transvaginal technique was performed to assess early pregnancy.  COMPARISON:  None.  FINDINGS: Intrauterine gestational sac: Visualized/normal in shape.  Yolk sac:  Present  Embryo:  Present  Cardiac Activity: Present  Heart Rate: 125  bpm  CRL:  7  mm   6 w   4 d                  [place] EDC: November 13, 2014  Maternal uterus/adnexae: Small subchorionic hemorrhage. Normal appearance of the adnexal (probable corpus luteal cyst on the LEFT). Caesarean section scar. No free fluid.  IMPRESSION: Single live intrauterine pregnancy, gestational age [redacted] weeks and 4 days by ultrasound. Small subchorionic hemorrhage.   Electronically Signed   By: Elon Alas   On: 03/24/2014 03:41   MAU Management: Reviewed labs done at [facility], including CBC, Quant hcg, and U/A.  U/A sent for culture. OB U/S done.  IV fluids from transfer continued while in MAU.  Pt tolerated PO fluids and food while in MAU.  ASSESSMENT 1. Intractable vomiting with nausea,  vomiting of unspecified type   2. Abdominal pain complicating pregnancy, antepartum   3. Nausea and vomiting during pregnancy prior to [redacted] weeks gestation   4. Diarrhea in adult patient   5. Heartburn during pregnancy in first trimester     PLAN Discharge home Phenergan 12.5-25 mg PO Q 6 hours PRN.  Pt may also place tablets vaginally or rectally if unable to tolerate PO. Discussed use of Unisom/B6 to prevent nausea Zantac 150 mg PO BID for heartburn/acid reflux Discussed seizure disorder and medications with pt.  She has not had seizure in greater than 1 year. She did do trial of no medications a few years ago  but had seizures and resumed medications.  She was switched to [place] in previous pregnancies without complication Discontinue Tegretol.  Start Keppra 500 mg BID. Message sent to [facility] to make appointment for pt   Follow-up Information    Please follow up.   Why:  With prenatal care with provider of your choice, see list below      Follow up with [place].   Why:  As needed for emergencies   Contact information:   [address] [id number] [place] Certified Nurse-Midwife 03/24/2014  3:58 AM

## 2014-03-24 NOTE — ED Notes (Signed)
MAU called, report given to [facility] RN, MAU ready for patient.

## 2014-03-24 NOTE — Progress Notes (Signed)
EDCM spoke to patient at bedside.  Patient reports she just got her Medicaid card and has never been to the [facility].  Patient's pcp listed as [facility] on patient's insurance card.  [facility] provided patient with pamphlet to [facility] and informed patient of walk in clinic times.  Patient thankful for resources.  No further EDCM needs at this time.

## 2014-03-24 NOTE — Discharge Instructions (Signed)
Nausea medication to take during pregnancy:   Unisom (doxylamine succinate 25 mg tablets) Take one tablet daily at bedtime. If symptoms are not adequately controlled, the dose can be increased to a maximum recommended dose of two tablets daily (1/2 tablet in the morning, 1/2 tablet mid-afternoon and one at bedtime).  Vitamin B6 100mg  tablets. Take one tablet twice a day (up to 200 mg per day).  Add Phenergan as prescribed to take as needed.    Morning Sickness Morning sickness is when you feel sick to your stomach (nauseous) during pregnancy. This nauseous feeling may or may not come with vomiting. It often occurs in the morning but can be a problem any time of day. Morning sickness is most common during the first trimester, but it may continue throughout pregnancy. While morning sickness is unpleasant, it is usually harmless unless you develop severe and continual vomiting (hyperemesis gravidarum). This condition requires more intense treatment.  CAUSES  The cause of morning sickness is not completely known but seems to be related to normal hormonal changes that occur in pregnancy. RISK FACTORS You are at greater risk if you:  Experienced nausea or vomiting before your pregnancy.  Had morning sickness during a previous pregnancy.  Are pregnant with more than one baby, such as twins. TREATMENT  Do not use any medicines (prescription, over-the-counter, or herbal) for morning sickness without first talking to your health care provider. Your health care provider may prescribe or recommend:  Vitamin B6 supplements.  Anti-nausea medicines.  The herbal medicine ginger. HOME CARE INSTRUCTIONS   Only take over-the-counter or prescription medicines as directed by your health care provider.  Taking multivitamins before getting pregnant can prevent or decrease the severity of morning sickness in most women.  Eat a piece of dry toast or unsalted crackers before getting out of bed in the  morning.  Eat five or six small meals a day.  Eat dry and bland foods (rice, baked potato). Foods high in carbohydrates are often helpful.  Do not drink liquids with your meals. Drink liquids between meals.  Avoid greasy, fatty, and spicy foods.  Get someone to cook for you if the smell of any food causes nausea and vomiting.  If you feel nauseous after taking prenatal vitamins, take the vitamins at night or with a snack.  Snack on protein foods (nuts, yogurt, cheese) between meals if you are hungry.  Eat unsweetened gelatins for desserts.  Wearing an acupressure wristband (worn for sea sickness) may be helpful.  Acupuncture may be helpful.  Do not smoke.  Get a humidifier to keep the air in your house free of odors.  Get plenty of fresh air. SEEK MEDICAL CARE IF:   Your home remedies are not working, and you need medicine.  You feel dizzy or lightheaded.  You are losing weight. SEEK IMMEDIATE MEDICAL CARE IF:   You have persistent and uncontrolled nausea and vomiting.  You pass out (faint). MAKE SURE YOU:  Understand these instructions.  Will watch your condition.  Will get help right away if you are not doing well or get worse. Document Released: 02/22/2006 Document Revised: 01/06/2013 Document Reviewed: 06/18/2012 [facility] Patient Information 2015 [place], [place]. This information is not intended to replace advice given to you by your health care provider. Make sure you discuss any questions you have with your health care provider.  Heartburn During Pregnancy  Heartburn is a burning sensation in the chest caused by stomach acid backing up into the esophagus. Heartburn is  common in pregnancy because a certain hormone (progesterone) is released when a woman is pregnant. The progesterone hormone may relax the valve that separates the esophagus from the stomach. This allows acid to go up into the esophagus, causing heartburn. Heartburn may also happen in pregnancy  because the enlarging uterus pushes up on the stomach, which pushes more acid into the esophagus. This is especially true in the later stages of pregnancy. Heartburn problems usually go away after giving birth. CAUSES  Heartburn is caused by stomach acid backing up into the esophagus. During pregnancy, this may result from various things, including:   The progesterone hormone.  Changing hormone levels.  The growing uterus pushing stomach acid upward.  Large meals.  Certain foods and drinks.  Exercise.  Increased acid production. SIGNS AND SYMPTOMS   Burning pain in the chest or lower throat.  Bitter taste in the mouth.  Coughing. DIAGNOSIS  Your health care provider will typically diagnose heartburn by taking a careful history of your concern. Blood tests may be done to check for a certain type of bacteria that is associated with heartburn. Sometimes, heartburn is diagnosed by prescribing a heartburn medicine to see if the symptoms improve. In some cases, a procedure called an endoscopy may be done. In this procedure, a tube with a light and a camera on the end (endoscope) is used to examine the esophagus and the stomach. TREATMENT  Treatment will vary depending on the severity of your symptoms. Your health care provider may recommend:  Over-the-counter medicines (antacids, acid reducers) for mild heartburn.  Prescription medicines to decrease stomach acid or to protect your stomach lining.  Certain changes in your diet.  Elevating the head of your bed by putting blocks under the legs. This helps prevent stomach acid from backing up into the esophagus when you are lying down. HOME CARE INSTRUCTIONS   Only take over-the-counter or prescription medicines as directed by your health care provider.  Raise the head of your bed by putting blocks under the legs if instructed to do so by your health care provider. Sleeping with more pillows is not effective because it only changes the  position of your head.  Do not exercise right after eating.  Avoid eating 2-3 hours before bed. Do not lie down right after eating.  Eat small meals throughout the day instead of three large meals.  Identify foods and beverages that make your symptoms worse and avoid them. Foods you may want to avoid include:  Peppers.  Chocolate.  High-fat foods, including fried foods.  Spicy foods.  Garlic and onions.  Citrus fruits, including oranges, grapefruit, lemons, and limes.  Food containing tomatoes or tomato products.  Mint.  Carbonated and caffeinated drinks.  Vinegar. SEEK MEDICAL CARE IF:  You have abdominal pain of any kind.  You feel burning in your upper abdomen or chest, especially after eating or lying down.  You have nausea and vomiting.  Your stomach feels upset after you eat. SEEK IMMEDIATE MEDICAL CARE IF:   You have severe chest pain that goes down your arm or into your jaw or neck.  You feel sweaty, dizzy, or light-headed.  You become short of breath.  You vomit blood.  You have difficulty or pain with swallowing.  You have bloody or black, tarry stools.  You have episodes of heartburn more than 3 times a week, for more than 2 weeks. MAKE SURE YOU:  Understand these instructions.  Will watch your condition.  Will get help  right away if you are not doing well or get worse. Document Released: 12/30/1999 Document Revised: 01/06/2013 Document Reviewed: 08/20/2012 [facility] Patient Information 2015 [place], [place]. This information is not intended to replace advice given to you by your health care provider. Make sure you discuss any questions you have with your health care provider.  Prenatal Care Providers [facility]  [phone]     Phone: [place]                      [facility]  @[place]     Phone: [phone]  Phone:  [phone]  [facility] [place]             @ [place]            Phone: [place]     Phone: [phone]  Phone: [place] [facility] @ [place]                hone: [phone]  Phone: [phone]         [facility] [address] [facility] [address] [facility] [address]acy Racer      Phone: [phone]  Phone: [phone]         [place] Dept.                Phone: [phone]  [place])          Phone: [phone] [facility]   Phone: [phone]

## 2014-03-25 LAB — CULTURE, OB URINE: Colony Count: >=100000

## 2014-03-25 LAB — GC/CHLAMYDIA PROBE AMP (~~LOC~~) NOT AT ARMC
Chlamydia: POSITIVE — AB
Neisseria Gonorrhea: NEGATIVE

## 2014-03-26 ENCOUNTER — Encounter (HOSPITAL_COMMUNITY): Payer: Self-pay | Admitting: *Deleted

## 2014-03-26 ENCOUNTER — Inpatient Hospital Stay (HOSPITAL_COMMUNITY)
Admission: AD | Admit: 2014-03-26 | Discharge: 2014-03-26 | Disposition: A | Discharge status: Home / Self Care | Payer: Medicaid Other | Source: Ambulatory Visit | Attending: Family Medicine | Admitting: Family Medicine

## 2014-03-26 DIAGNOSIS — Z3A01 Less than 8 weeks gestation of pregnancy: Secondary | ICD-10-CM | POA: Insufficient documentation

## 2014-03-26 DIAGNOSIS — O98311 Other infections with a predominantly sexual mode of transmission complicating pregnancy, first trimester: Secondary | ICD-10-CM | POA: Insufficient documentation

## 2014-03-26 DIAGNOSIS — A568 Sexually transmitted chlamydial infection of other sites: Secondary | ICD-10-CM | POA: Diagnosis not present

## 2014-03-26 MED ORDER — DIPHENHYDRAMINE HCL 25 MG PO CAPS
50.0000 mg | ORAL_CAPSULE | Freq: Once | ORAL | Status: AC
  Administered 2014-03-26: 50 mg via ORAL
  Filled 2014-03-26: qty 2

## 2014-03-26 MED ORDER — AZITHROMYCIN 250 MG PO TABS
1000.0000 mg | ORAL_TABLET | Freq: Once | ORAL | Status: AC
  Administered 2014-03-26: 1000 mg via ORAL
  Filled 2014-03-26: qty 4

## 2014-03-26 NOTE — MAU Note (Signed)
Was called and told to come in for treatment.

## 2014-03-26 NOTE — Discharge Instructions (Signed)
Follow up with physician of choice regarding prenatal care.

## 2014-03-26 NOTE — MAU Provider Note (Signed)
Jasmine [name] is a 24 y.o. G3P1 at [redacted]w[redacted]d who presents to MAU today for treatment of chlamydia. Patient had a recent positive culture in MAU and has a childhood allergy of hive to erythromycin. All other acceptable treatments are not recommended in pregnancy. Patient advised to come to MAU for pre-treatment with benadryl and observation after treatment with Zithromax.   BP 132/49 mmHg  Pulse 89  Temp(Src) 99.5 F (37.5 C)  Resp 18  LMP 03/06/2014 GENERAL: Well-developed, well-nourished female in no acute distress.  HEENT: Normocephalic, atraumatic.   LUNGS: Effort normal HEART: Regular rate  SKIN: Warm, dry and without erythema PSYCH: Normal mood and affect  MDM 50 mg Benadryl PO given ~ 20 minutes prior to 1000 mg PO Zithromax Patient will be observed x at least 30 minutes prior to discharge for possible allergic reactions  A: Chlamydia  P: Discharge home Patient treatment in MAU today Partner treatment advised Patient advised to follow-up to start prenatal care with provider of choice Patient may return to MAU as needed or if her condition were to change or worsen   1956 - Care turned over to Jasmine Dimmer, PA-C for continued observation  Jasmine Redden, PA-C 03/26/2014 7:55 PM    Patient observed x 1 hour post-medication.  No sign of allergic reaction.  Precautions for pt to return should condition worsen.

## 2014-04-07 ENCOUNTER — Other Ambulatory Visit (HOSPITAL_COMMUNITY): Payer: Self-pay

## 2014-04-07 ENCOUNTER — Emergency Department (HOSPITAL_COMMUNITY): Payer: Medicaid Other

## 2014-04-07 ENCOUNTER — Encounter (HOSPITAL_COMMUNITY): Payer: Self-pay

## 2014-04-07 ENCOUNTER — Inpatient Hospital Stay (HOSPITAL_COMMUNITY)
Admission: EM | Admit: 2014-04-07 | Discharge: 2014-04-09 | DRG: 781 | Disposition: A | Discharge status: Home / Self Care | Payer: Medicaid Other | Attending: Internal Medicine | Admitting: Internal Medicine

## 2014-04-07 DIAGNOSIS — R079 Chest pain, unspecified: Secondary | ICD-10-CM | POA: Diagnosis not present

## 2014-04-07 DIAGNOSIS — J45901 Unspecified asthma with (acute) exacerbation: Secondary | ICD-10-CM | POA: Diagnosis present

## 2014-04-07 DIAGNOSIS — D509 Iron deficiency anemia, unspecified: Secondary | ICD-10-CM | POA: Diagnosis present

## 2014-04-07 DIAGNOSIS — D573 Sickle-cell trait: Secondary | ICD-10-CM | POA: Diagnosis present

## 2014-04-07 DIAGNOSIS — E876 Hypokalemia: Secondary | ICD-10-CM | POA: Diagnosis not present

## 2014-04-07 DIAGNOSIS — R0602 Shortness of breath: Secondary | ICD-10-CM

## 2014-04-07 DIAGNOSIS — Z3A08 8 weeks gestation of pregnancy: Secondary | ICD-10-CM | POA: Diagnosis present

## 2014-04-07 DIAGNOSIS — D649 Anemia, unspecified: Secondary | ICD-10-CM | POA: Diagnosis not present

## 2014-04-07 DIAGNOSIS — D508 Other iron deficiency anemias: Secondary | ICD-10-CM | POA: Diagnosis not present

## 2014-04-07 DIAGNOSIS — O26891 Other specified pregnancy related conditions, first trimester: Principal | ICD-10-CM | POA: Diagnosis present

## 2014-04-07 DIAGNOSIS — G40909 Epilepsy, unspecified, not intractable, without status epilepticus: Secondary | ICD-10-CM

## 2014-04-07 DIAGNOSIS — Z3401 Encounter for supervision of normal first pregnancy, first trimester: Secondary | ICD-10-CM | POA: Diagnosis present

## 2014-04-07 DIAGNOSIS — Z3490 Encounter for supervision of normal pregnancy, unspecified, unspecified trimester: Secondary | ICD-10-CM

## 2014-04-07 LAB — CBC WITH DIFFERENTIAL/PLATELET
BASOS PCT: 0 % (ref 0–1)
Basophils Absolute: 0 10*3/uL (ref 0.0–0.1)
Eosinophils Absolute: 0.1 10*3/uL (ref 0.0–0.7)
Eosinophils Relative: 0 % (ref 0–5)
HCT: 37.1 % (ref 36.0–46.0)
Hemoglobin: 12.8 g/dL (ref 12.0–15.0)
Lymphocytes Relative: 12 % (ref 12–46)
Lymphs Abs: 1.8 10*3/uL (ref 0.7–4.0)
MCH: 29.6 pg (ref 26.0–34.0)
MCHC: 34.5 g/dL (ref 30.0–36.0)
MCV: 85.9 fL (ref 78.0–100.0)
Monocytes Absolute: 0.3 10*3/uL (ref 0.1–1.0)
Monocytes Relative: 2 % — ABNORMAL LOW (ref 3–12)
NEUTROS ABS: 12.5 10*3/uL — AB (ref 1.7–7.7)
NEUTROS PCT: 86 % — AB (ref 43–77)
PLATELETS: 215 10*3/uL (ref 150–400)
RBC: 4.32 MIL/uL (ref 3.87–5.11)
RDW: 13.5 % (ref 11.5–15.5)
WBC: 14.5 10*3/uL — ABNORMAL HIGH (ref 4.0–10.5)

## 2014-04-07 LAB — BASIC METABOLIC PANEL
Anion gap: 9 (ref 5–15)
BUN: 7 mg/dL (ref 6–23)
CO2: 22 mmol/L (ref 19–32)
CREATININE: 0.54 mg/dL (ref 0.50–1.10)
Calcium: 9.2 mg/dL (ref 8.4–10.5)
Chloride: 102 mmol/L (ref 96–112)
GFR calc Af Amer: >90 mL/min (ref >90)
GLUCOSE: 130 mg/dL — AB (ref 70–99)
Potassium: 2.9 mmol/L — ABNORMAL LOW (ref 3.5–5.1)
Sodium: 133 mmol/L — ABNORMAL LOW (ref 135–145)

## 2014-04-07 MED ORDER — IPRATROPIUM BROMIDE 0.02 % IN SOLN
0.5000 mg | RESPIRATORY_TRACT | Status: DC
  Administered 2014-04-08 (×2): 0.5 mg via RESPIRATORY_TRACT
  Filled 2014-04-07 (×2): qty 2.5

## 2014-04-07 MED ORDER — LEVETIRACETAM 500 MG PO TABS
500.0000 mg | ORAL_TABLET | Freq: Two times a day (BID) | ORAL | Status: DC
  Administered 2014-04-08 – 2014-04-09 (×4): 500 mg via ORAL
  Filled 2014-04-07 (×6): qty 1

## 2014-04-07 MED ORDER — ALBUTEROL SULFATE (2.5 MG/3ML) 0.083% IN NEBU
2.5000 mg | INHALATION_SOLUTION | RESPIRATORY_TRACT | Status: DC
  Administered 2014-04-08 (×2): 2.5 mg via RESPIRATORY_TRACT
  Filled 2014-04-07 (×2): qty 3

## 2014-04-07 MED ORDER — ALBUTEROL SULFATE (2.5 MG/3ML) 0.083% IN NEBU
5.0000 mg | INHALATION_SOLUTION | Freq: Once | RESPIRATORY_TRACT | Status: AC
  Administered 2014-04-07: 5 mg via RESPIRATORY_TRACT
  Filled 2014-04-07: qty 6

## 2014-04-07 MED ORDER — METHYLPREDNISOLONE SODIUM SUCC 125 MG IJ SOLR
125.0000 mg | Freq: Once | INTRAMUSCULAR | Status: AC
  Administered 2014-04-07: 125 mg via INTRAVENOUS
  Filled 2014-04-07: qty 2

## 2014-04-07 MED ORDER — SODIUM CHLORIDE 0.9 % IJ SOLN
3.0000 mL | Freq: Two times a day (BID) | INTRAMUSCULAR | Status: DC
  Administered 2014-04-07 – 2014-04-09 (×4): 3 mL via INTRAVENOUS

## 2014-04-07 MED ORDER — HEPARIN SODIUM (PORCINE) 5000 UNIT/ML IJ SOLN
5000.0000 [IU] | Freq: Three times a day (TID) | INTRAMUSCULAR | Status: DC
  Administered 2014-04-08: 5000 [IU] via SUBCUTANEOUS
  Filled 2014-04-07: qty 1

## 2014-04-07 MED ORDER — POTASSIUM CHLORIDE CRYS ER 20 MEQ PO TBCR
40.0000 meq | EXTENDED_RELEASE_TABLET | Freq: Once | ORAL | Status: AC
  Administered 2014-04-07: 40 meq via ORAL
  Filled 2014-04-07: qty 2

## 2014-04-07 MED ORDER — IPRATROPIUM BROMIDE 0.02 % IN SOLN
0.5000 mg | Freq: Once | RESPIRATORY_TRACT | Status: AC
  Administered 2014-04-07: 0.5 mg via RESPIRATORY_TRACT
  Filled 2014-04-07: qty 2.5

## 2014-04-07 MED ORDER — IPRATROPIUM-ALBUTEROL 0.5-2.5 (3) MG/3ML IN SOLN
3.0000 mL | Freq: Once | RESPIRATORY_TRACT | Status: AC
  Administered 2014-04-07: 3 mL via RESPIRATORY_TRACT
  Filled 2014-04-07: qty 3

## 2014-04-07 MED ORDER — ALBUTEROL (5 MG/ML) CONTINUOUS INHALATION SOLN
10.0000 mg/h | INHALATION_SOLUTION | RESPIRATORY_TRACT | Status: AC
  Administered 2014-04-07: 10 mg/h via RESPIRATORY_TRACT
  Filled 2014-04-07: qty 20

## 2014-04-07 NOTE — ED Notes (Signed)
Patient states breathing improved-feels jittery from the meds/neb treatments

## 2014-04-07 NOTE — ED Notes (Signed)
While ambulating with pulsox, oxygen dropped to 92%, pulse up in the 120s. Pt c/o worsening chest pain and difficulty breathing while walking.

## 2014-04-07 NOTE — Progress Notes (Signed)
EDCM spoke to patient at bedside. Patient noted to have been in the ED 5 times within the last six months.  Patient confirms she has Medicaid insurance.  Patient is also aware her pcp is located at [facility].  Patient reports she has never been there and has never made an appointment to see them.  EDCM instructed patient on using the Ed for emergency situations and the importance and purpose of having a pcp.  [facility] strongly encouraged patient to make a follow up appointment with her pcp at [facility] when she is discharged.  [facility] provided patient with pamphlet to [facility] with phone number and address, walk in times.  Patient thankful for resources.  No further EDCM needs at this time.

## 2014-04-07 NOTE — ED Notes (Signed)
Per pt, increased shortness of breath since yesterday. Pt has asthma and states this feels like her asthma. Out of inhaler.  Nebulizer is broken

## 2014-04-07 NOTE — H&P (Signed)
[facility] History and Physical  Patient: Jasmine [name]  MRN: [id number]  DOB: 07-10-1990  DOS: the patient was seen and examined on 04/07/2014 PCP: No PCP Per Patient  Chief Complaint: Shortness of breath and chest tightness  HPI: Jasmine [name] is a 24 y.o. female with Past medical history of seizure disorder, sickle cell trait, asthma. The patient is presenting with complaints of a 2 day history of asthma as well as shortness of breath. Patient denies any recent travel or recent sick contact. Patient was recently seen in the beginning of this month for chlamydia infection and has been treated with azithromycin. Patient denies any smoking history. Denies any alcohol history or any choking episode. Patient denies any rash anywhere. Patient denies any leg swelling or leg tenderness. Denies any nausea vomiting or abdominal pain. The pain is throughout her chest and feels like tightness when she is trying to walk. She has been using her inhaler at home but she has ran out of it 1 week ago and therefore has not been able to get any treatment for last 2 days of ongoing shortness of breath and therefore she is here.  The patient is coming from home. And at her baseline independent for most of her ADL.  Review of Systems: as mentioned in the history of present illness.  A Comprehensive review of the other systems is negative.  Past Medical History  Diagnosis Date  . Asthma   . Seizures   . Sickle cell trait   . Pancreatitis   . Heart murmur    Past Surgical History  Procedure Laterality Date  . Nerve, tendon and artery repair Right 09/23/2012    Procedure: I&D and Repair As Necessary/Right Hand and Palm;  Surgeon: Roseanne Kaufman, MD;  Location: [facility];  Service: Orthopedics;  Laterality: Right;  . Tonsillectomy    . Hernia repair    . Cesarean section    . Hand surgery    . Appendectomy     Social History:  reports that she has quit smoking. Her smoking use included  Cigarettes. She has never used smokeless tobacco. She reports that she does not drink alcohol or use illicit drugs.  Allergies  Allergen Reactions  . Avocado Anaphylaxis  . Cheese Shortness Of Breath    Asthma trigger  . Chocolate Shortness Of Breath    Asthma trigger  . Ibuprofen Hives and Shortness Of Breath    Tolerates naproxen  . Orange Juice [Orange Oil] Shortness Of Breath  . Other Hives    ALLERGIC TO ALL STEROIDS       EXCEPT IV SOLU MEDROL  . Peach [Prunus Persica] Anaphylaxis  . Peanuts [Peanut Oil] Anaphylaxis  . Pear Anaphylaxis  . Prednisone Hives  . Raspberry Anaphylaxis  . Tylenol [Acetaminophen] Hives and Shortness Of Breath  . Amoxicillin Hives  . Doxycycline Hives  . Erythromycin Hives  . Ivp Dye [Iodinated Diagnostic Agents]     Shortness of breath   . Latex     Swelling/anaphylaxis  . Penicillins Hives    History reviewed. No pertinent family history.  Prior to Admission medications   Medication Sig Start Date End Date Taking? Authorizing Provider  albuterol (PROVENTIL HFA;VENTOLIN HFA) 108 (90 BASE) MCG/ACT inhaler Inhale 1-2 puffs into the lungs every 6 (six) hours as needed for wheezing or shortness of breath. Patient not taking: Reported on 04/07/2014 01/12/14   April Palumbo, MD  levETIRAcetam (KEPPRA) 500 MG tablet Take 1 tablet (500 mg total) by mouth 2 (  two) times daily. 03/24/14   Lisa A Leftwich-Kirby, CNM  nitrofurantoin, macrocrystal-monohydrate, (MACROBID) 100 MG capsule Take 1 capsule (100 mg total) by mouth 2 (two) times daily. 03/24/14   Lattie Haw A Leftwich-Kirby, CNM  promethazine (PHENERGAN) 25 MG tablet Take 0.5-1 tablets (12.5-25 mg total) by mouth every 6 (six) hours as needed. 03/24/14   Lattie Haw A Leftwich-Kirby, CNM  ranitidine (ZANTAC) 150 MG tablet Take 1 tablet (150 mg total) by mouth 2 (two) times daily. 03/24/14   Elvera Maria, CNM    Physical Exam: Filed Vitals:   04/07/14 1812 04/07/14 1857 04/07/14 1935 04/07/14 2208  BP:   128/55  119/46  Pulse:  77  78  Temp:    98.5 F (36.9 C)  TempSrc:    Oral  Resp:  18  18  SpO2: 95% 100% 98% 98%    General: Alert, Awake and Oriented to Time, Place and Person. Appear in mild distress Eyes: PERRL ENT: Oral Mucosa clear moist. Neck: no JVD Cardiovascular: S1 and S2 Present, no Murmur, Peripheral Pulses Present Respiratory: Bilateral Air entry equal and Decreased significantly, nCrackles, occasional expiratory wheezes Abdomen: Bowel Sound present, Soft and non tender Skin: no Rash Extremities: no Pedal edema, no calf tenderness Neurologic: Grossly no focal neuro deficit.  Labs on Admission:  CBC:  Recent Labs Lab 04/07/14 2016  WBC 14.5*  NEUTROABS 12.5*  HGB 12.8  HCT 37.1  MCV 85.9  PLT 215    CMP     Component Value Date/Time   NA 133* 04/07/2014 2016   K 2.9* 04/07/2014 2016   CL 102 04/07/2014 2016   CO2 22 04/07/2014 2016   GLUCOSE 130* 04/07/2014 2016   BUN 7 04/07/2014 2016   CREATININE 0.54 04/07/2014 2016   CALCIUM 9.2 04/07/2014 2016   PROT 7.3 03/23/2014 1915   ALBUMIN 4.2 03/23/2014 1915   AST 24 03/23/2014 1915   ALT 28 03/23/2014 1915   ALKPHOS 49 03/23/2014 1915   BILITOT 0.4 03/23/2014 1915   GFRNONAA >90 04/07/2014 2016   GFRAA >90 04/07/2014 2016    No results for input(s): LIPASE, AMYLASE in the last 168 hours.  No results for input(s): CKTOTAL, CKMB, CKMBINDEX, TROPONINI in the last 168 hours. BNP (last 3 results) No results for input(s): BNP in the last 8760 hours.  ProBNP (last 3 results) No results for input(s): PROBNP in the last 8760 hours.   Radiological Exams on Admission: Dg Chest 2 View  04/07/2014   CLINICAL DATA:  One day history of difficulty breathing.  EXAM: CHEST  2 VIEW  COMPARISON:  January 12, 2014  FINDINGS: Lungs are clear. Heart size and pulmonary vascularity are normal. No adenopathy. No bone lesions.  IMPRESSION: No edema or consolidation.   Electronically Signed   By: Lowella Grip  III M.D.   On: 04/07/2014 20:47   EKG: Independently reviewed. normal sinus rhythm, prolonged QT interval.  Assessment/Plan Principal Problem:   Asthma exacerbation Active Problems:   Seizure disorder, sept 2015 last seizure   Anemia   Pregnant, 8 weeks   Hypokalemia   1. Asthma exacerbation  The patient is presenting with complaints of asthma exacerbation with shortness of breath and is significantly decreased breath sounds with occasional wheezing. She is not hypoxic both on exertion but appears in significant respiratory distress. She will be admitted in the hospital for close monitoring. I will treat her with DuoNeb's and is on Medrol. Follow sputum culture urine antigens as well as an pleasant PCR.  Monitor her on telemetry.  2. 8 weeks pregnancy. The patient is G3 P1 at 8 weeks, she has a follow-up appointment in April with OB/GYN. Patient was recently treated for chlamydia with azithromycin. Continue fetal heart rate monitoring every shift.  3. hypokalemia. Replacing.  4. history of seizure disorder. Patient is on Bricelyn since she has been taking it regularly I will continue it at present. We will add Foley acid and prenatal vitamins.  5. history of anemia. H&H stable. Continue monitoring.  Advance goals of care discussion: full code   DVT Prophylaxis: Compression Nutrition:  Regular diet  Disposition: Admitted to inpatient in step-down unit.  Author: Berle Mull, MD [facility] Pager: [phone] 04/07/2014, 10:53 PM    If 7PM-7AM, please contact night-coverage [email or web address] Password [email or web address]

## 2014-04-07 NOTE — ED Provider Notes (Signed)
CSN: [id number]     Arrival date & time 04/07/14  1607 History   First MD Initiated Contact with Patient 04/07/14 1619     Chief Complaint  Patient presents with  . Shortness of Breath  . Asthma     (Consider location/radiation/quality/duration/timing/severity/associated sxs/prior Treatment) HPI Comments: Patient who is 2 months pregnant with past medical history of shortness of breath and asthma. She has been hospitalized and intubated in the past because of asthma exacerbations. She has an allergy to prednisone. She states that she has run out of her inhaler and nebulizer. She denies any fevers chills. Denies any productive cough. She states she has had some nausea and vomiting secondary to morning sickness.  She states that her symptoms started yesterday.  The history is provided by the patient. No language interpreter was used.    Past Medical History  Diagnosis Date  . Asthma   . Seizures   . Sickle cell trait   . Pancreatitis   . Heart murmur    Past Surgical History  Procedure Laterality Date  . Nerve, tendon and artery repair Right 09/23/2012    Procedure: I&D and Repair As Necessary/Right Hand and Palm;  Surgeon: Roseanne Kaufman, MD;  Location: [facility];  Service: Orthopedics;  Laterality: Right;  . Tonsillectomy    . Hernia repair    . Cesarean section    . Hand surgery    . Appendectomy     History reviewed. No pertinent family history. History  Substance Use Topics  . Smoking status: Former Smoker    Types: Cigarettes  . Smokeless tobacco: Never Used  . Alcohol Use: No   OB History    Gravida Para Term Preterm AB TAB SAB Ectopic Multiple Living   3 1        2      Review of Systems  Constitutional: Negative for fever and chills.  Respiratory: Positive for wheezing. Negative for shortness of breath.   Cardiovascular: Negative for chest pain.  Gastrointestinal: Negative for nausea, vomiting, diarrhea and constipation.  Genitourinary: Negative for dysuria.  All  other systems reviewed and are negative.     Allergies  Avocado; Cheese; Chocolate; Ibuprofen; Orange juice; Other; Peach; Peanuts; Pear; Prednisone; Raspberry; Tylenol; Amoxicillin; Doxycycline; Erythromycin; Ivp dye; Latex; and Penicillins  Home Medications   Prior to Admission medications   Medication Sig Start Date End Date Taking? Authorizing Provider  albuterol (PROVENTIL HFA;VENTOLIN HFA) 108 (90 BASE) MCG/ACT inhaler Inhale 1-2 puffs into the lungs every 6 (six) hours as needed for wheezing or shortness of breath. Patient not taking: Reported on 04/07/2014 01/12/14   April Palumbo, MD  levETIRAcetam (KEPPRA) 500 MG tablet Take 1 tablet (500 mg total) by mouth 2 (two) times daily. 03/24/14   Lisa A Leftwich-Kirby, CNM  nitrofurantoin, macrocrystal-monohydrate, (MACROBID) 100 MG capsule Take 1 capsule (100 mg total) by mouth 2 (two) times daily. 03/24/14   Lattie Haw A Leftwich-Kirby, CNM  promethazine (PHENERGAN) 25 MG tablet Take 0.5-1 tablets (12.5-25 mg total) by mouth every 6 (six) hours as needed. 03/24/14   Lattie Haw A Leftwich-Kirby, CNM  ranitidine (ZANTAC) 150 MG tablet Take 1 tablet (150 mg total) by mouth 2 (two) times daily. 03/24/14   Lisa A Leftwich-Kirby, CNM   BP 131/78 mmHg  Pulse 79  Temp(Src) 98.8 F (37.1 C) (Oral)  Resp 14  SpO2 98%  LMP 03/06/2014 Physical Exam  Constitutional: She is oriented to person, place, and time. She appears well-developed and well-nourished.  HENT:  Head:  Normocephalic and atraumatic.  Eyes: Conjunctivae and EOM are normal. Pupils are equal, round, and reactive to light.  Neck: Normal range of motion. Neck supple.  Cardiovascular: Normal rate and regular rhythm.  Exam reveals no gallop and no friction rub.   No murmur heard. Pulmonary/Chest: Effort normal. No respiratory distress. She has wheezes. She has no rales. She exhibits no tenderness.  Mild wheezes, decreased lung sounds  Abdominal: Soft. Bowel sounds are normal. She exhibits no  distension and no mass. There is no tenderness. There is no rebound and no guarding.  Musculoskeletal: Normal range of motion. She exhibits no edema or tenderness.  Neurological: She is alert and oriented to person, place, and time.  Skin: Skin is warm and dry.  Psychiatric: She has a normal mood and affect. Her behavior is normal. Judgment and thought content normal.  Nursing note and vitals reviewed.   ED Course  Procedures (including critical care time) Results for orders placed or performed during the hospital encounter of 04/07/14  CBC with Differential/Platelet  Result Value Ref Range   WBC 14.5 (H) 4.0 - 10.5 K/uL   RBC 4.32 3.87 - 5.11 MIL/uL   Hemoglobin 12.8 12.0 - 15.0 g/dL   HCT 37.1 36.0 - 46.0 %   MCV 85.9 78.0 - 100.0 fL   MCH 29.6 26.0 - 34.0 pg   MCHC 34.5 30.0 - 36.0 g/dL   RDW 13.5 11.5 - 15.5 %   Platelets 215 150 - 400 K/uL   Neutrophils Relative % 86 (H) 43 - 77 %   Neutro Abs 12.5 (H) 1.7 - 7.7 K/uL   Lymphocytes Relative 12 12 - 46 %   Lymphs Abs 1.8 0.7 - 4.0 K/uL   Monocytes Relative 2 (L) 3 - 12 %   Monocytes Absolute 0.3 0.1 - 1.0 K/uL   Eosinophils Relative 0 0 - 5 %   Eosinophils Absolute 0.1 0.0 - 0.7 K/uL   Basophils Relative 0 0 - 1 %   Basophils Absolute 0.0 0.0 - 0.1 K/uL  Basic metabolic panel  Result Value Ref Range   Sodium 133 (L) 135 - 145 mmol/L   Potassium 2.9 (L) 3.5 - 5.1 mmol/L   Chloride 102 96 - 112 mmol/L   CO2 22 19 - 32 mmol/L   Glucose, Bld 130 (H) 70 - 99 mg/dL   BUN 7 6 - 23 mg/dL   Creatinine, Ser 0.54 0.50 - 1.10 mg/dL   Calcium 9.2 8.4 - 10.5 mg/dL   GFR calc non Af Amer >90 >90 mL/min   GFR calc Af Amer >90 >90 mL/min   Anion gap 9 5 - 15     EKG Interpretation None      MDM   Final diagnoses:  SOB (shortness of breath)  Chest pain, unspecified chest pain type  Asthma exacerbation   Patient with asthma exacerbation.  She is out of her meds.  She is 2 months pregnant. She has an allergy to prednisone.   Discussed with Dr. Rogene Houston, who recommends solumedrol.  Will give additional breathing treatment as well and reassess.  Patient reassessed, still has significantly diminished lung sounds and mild wheezes. Will give another breathing treatment.  8:04 PM Reassessed while patient is getting continuous nebulizer treatment. She is currently oxygenating at 86-88% while getting the nebulizer treatment. More wheezing is heard than heard previously. Patient will likely need to be admitted.  Patient discussed with Dr. Rogene Houston, who agrees with plan for admission.  Recommends CXR given that  the patient has had to be intubated in the past.   9:19 PM Patient ambulates and is able to compensate well maintaining 91-92% oxygenation, but she quickly tires and becomes very fatigued complaining of chest tightness.  CXR unremarkable for infection, however, patient does have a moderate leukocytosis.  K is 2.9, will supplement.  Patient to be admitted to step-down.  Appreciate Dr. Posey Pronto for admission.    Montine Circle, PA-C 04/07/14 [place], MD 04/11/14 [phone]

## 2014-04-07 NOTE — ED Provider Notes (Signed)
   Medical Screening Exam in [facility].    Pt with hx asthma, currently pregnant, p/w SOB, chest tightness that began yesterday.  She has extensive history of hospitalizations, intubations for asthma.  Reports allergy to prednisone. Pt reports being out of all her regular asthma medications at this time.   Denies fevers.   Alert, NAD, RRR, Not moving much air on exam, some wheezing noted.    Duoneb treatment ordered, pt to moved to main ED for further evaluation and treatment.    Clayton Bibles, PA-C 04/07/14 1637  Lacretia Leigh, MD 04/08/14 [phone]

## 2014-04-07 NOTE — ED Notes (Signed)
PA at bedside.

## 2014-04-07 NOTE — ED Notes (Signed)
RT called

## 2014-04-08 ENCOUNTER — Encounter (HOSPITAL_COMMUNITY): Payer: Self-pay | Admitting: *Deleted

## 2014-04-08 DIAGNOSIS — D508 Other iron deficiency anemias: Secondary | ICD-10-CM

## 2014-04-08 DIAGNOSIS — Z3490 Encounter for supervision of normal pregnancy, unspecified, unspecified trimester: Secondary | ICD-10-CM

## 2014-04-08 DIAGNOSIS — E876 Hypokalemia: Secondary | ICD-10-CM | POA: Diagnosis present

## 2014-04-08 LAB — COMPREHENSIVE METABOLIC PANEL
ALT: 41 U/L — AB (ref 0–35)
AST: 28 U/L (ref 0–37)
Albumin: 4 g/dL (ref 3.5–5.2)
Alkaline Phosphatase: 43 U/L (ref 39–117)
Anion gap: 11 (ref 5–15)
BUN: <5 mg/dL — ABNORMAL LOW (ref 6–23)
CO2: 20 mmol/L (ref 19–32)
CREATININE: 0.47 mg/dL — AB (ref 0.50–1.10)
Calcium: 9.4 mg/dL (ref 8.4–10.5)
Chloride: 104 mmol/L (ref 96–112)
GFR calc Af Amer: >90 mL/min (ref >90)
GFR calc non Af Amer: >90 mL/min (ref >90)
Glucose, Bld: 118 mg/dL — ABNORMAL HIGH (ref 70–99)
Potassium: 3.8 mmol/L (ref 3.5–5.1)
SODIUM: 135 mmol/L (ref 135–145)
TOTAL PROTEIN: 7 g/dL (ref 6.0–8.3)
Total Bilirubin: 0.5 mg/dL (ref 0.3–1.2)

## 2014-04-08 LAB — BLOOD GAS, ARTERIAL
ACID-BASE DEFICIT: 5.4 mmol/L — AB (ref 0.0–2.0)
BICARBONATE: 17.9 meq/L — AB (ref 20.0–24.0)
DRAWN BY: 31814
O2 SAT: 99.5 %
Patient temperature: 98.5
TCO2: 16.2 mmol/L (ref 0–100)
pCO2 arterial: 29.8 mmHg — ABNORMAL LOW (ref 35.0–45.0)
pH, Arterial: 7.397 (ref 7.350–7.450)
pO2, Arterial: 201 mmHg — ABNORMAL HIGH (ref 80.0–100.0)

## 2014-04-08 LAB — TROPONIN I: Troponin I: <0.03 ng/mL (ref <0.031)

## 2014-04-08 LAB — BASIC METABOLIC PANEL
ANION GAP: 11 (ref 5–15)
BUN: <5 mg/dL — ABNORMAL LOW (ref 6–23)
CHLORIDE: 104 mmol/L (ref 96–112)
CO2: 19 mmol/L (ref 19–32)
CREATININE: 0.64 mg/dL (ref 0.50–1.10)
Calcium: 9.6 mg/dL (ref 8.4–10.5)
GFR calc Af Amer: >90 mL/min (ref >90)
GFR calc non Af Amer: >90 mL/min (ref >90)
GLUCOSE: 127 mg/dL — AB (ref 70–99)
Potassium: 4 mmol/L (ref 3.5–5.1)
Sodium: 134 mmol/L — ABNORMAL LOW (ref 135–145)

## 2014-04-08 LAB — CBC WITH DIFFERENTIAL/PLATELET
BASOS ABS: 0 10*3/uL (ref 0.0–0.1)
BASOS PCT: 0 % (ref 0–1)
EOS ABS: 0 10*3/uL (ref 0.0–0.7)
Eosinophils Relative: 0 % (ref 0–5)
HCT: 35.2 % — ABNORMAL LOW (ref 36.0–46.0)
Hemoglobin: 12 g/dL (ref 12.0–15.0)
Lymphocytes Relative: 7 % — ABNORMAL LOW (ref 12–46)
Lymphs Abs: 1.4 10*3/uL (ref 0.7–4.0)
MCH: 29 pg (ref 26.0–34.0)
MCHC: 34.1 g/dL (ref 30.0–36.0)
MCV: 85 fL (ref 78.0–100.0)
Monocytes Absolute: 0.6 10*3/uL (ref 0.1–1.0)
Monocytes Relative: 3 % (ref 3–12)
Neutro Abs: 18 10*3/uL — ABNORMAL HIGH (ref 1.7–7.7)
Neutrophils Relative %: 90 % — ABNORMAL HIGH (ref 43–77)
PLATELETS: 224 10*3/uL (ref 150–400)
RBC: 4.14 MIL/uL (ref 3.87–5.11)
RDW: 13.4 % (ref 11.5–15.5)
WBC: 19.9 10*3/uL — AB (ref 4.0–10.5)

## 2014-04-08 LAB — MRSA PCR SCREENING: MRSA BY PCR: NEGATIVE

## 2014-04-08 LAB — STREP PNEUMONIAE URINARY ANTIGEN: STREP PNEUMO URINARY ANTIGEN: NEGATIVE

## 2014-04-08 LAB — PROTIME-INR
INR: 1.08 (ref 0.00–1.49)
PROTHROMBIN TIME: 14.1 s (ref 11.6–15.2)

## 2014-04-08 LAB — INFLUENZA PANEL BY PCR (TYPE A & B)
H1N1 flu by pcr: NOT DETECTED
Influenza A By PCR: NEGATIVE
Influenza B By PCR: NEGATIVE

## 2014-04-08 LAB — MAGNESIUM: MAGNESIUM: 1.8 mg/dL (ref 1.5–2.5)

## 2014-04-08 MED ORDER — IPRATROPIUM-ALBUTEROL 0.5-2.5 (3) MG/3ML IN SOLN
3.0000 mL | RESPIRATORY_TRACT | Status: DC
  Administered 2014-04-08 – 2014-04-09 (×7): 3 mL via RESPIRATORY_TRACT
  Filled 2014-04-08 (×7): qty 3

## 2014-04-08 MED ORDER — METHYLPREDNISOLONE SODIUM SUCC 125 MG IJ SOLR: 60.0000 mg | INTRAMUSCULAR | Status: DC

## 2014-04-08 MED ORDER — LIDOCAINE 5 % EX PTCH
1.0000 | MEDICATED_PATCH | CUTANEOUS | Status: DC
  Administered 2014-04-08: 1 via TRANSDERMAL
  Filled 2014-04-08 (×3): qty 1

## 2014-04-08 MED ORDER — METHYLPREDNISOLONE SODIUM SUCC 125 MG IJ SOLR
60.0000 mg | Freq: Two times a day (BID) | INTRAMUSCULAR | Status: DC
  Administered 2014-04-08 – 2014-04-09 (×3): 60 mg via INTRAVENOUS
  Filled 2014-04-08: qty 2
  Filled 2014-04-08 (×4): qty 0.96

## 2014-04-08 MED ORDER — FOLIC ACID 1 MG PO TABS
1.0000 mg | ORAL_TABLET | Freq: Every day | ORAL | Status: DC
Start: 2014-04-08 — End: 2014-04-09
  Administered 2014-04-08 – 2014-04-09 (×2): 1 mg via ORAL
  Filled 2014-04-08 (×2): qty 1

## 2014-04-08 MED ORDER — BUDESONIDE 0.25 MG/2ML IN SUSP
0.2500 mg | Freq: Two times a day (BID) | RESPIRATORY_TRACT | Status: DC
  Administered 2014-04-08 – 2014-04-09 (×3): 0.25 mg via RESPIRATORY_TRACT
  Filled 2014-04-08 (×3): qty 2

## 2014-04-08 MED ORDER — FAMOTIDINE 40 MG PO TABS
40.0000 mg | ORAL_TABLET | Freq: Every day | ORAL | Status: DC
  Administered 2014-04-08 – 2014-04-09 (×2): 40 mg via ORAL
  Filled 2014-04-08 (×2): qty 1

## 2014-04-08 MED ORDER — BACIT-POLY-NEO HC 1 % EX OINT
TOPICAL_OINTMENT | Freq: Two times a day (BID) | CUTANEOUS | Status: DC
  Administered 2014-04-08 – 2014-04-09 (×2): via TOPICAL
  Filled 2014-04-08: qty 15

## 2014-04-08 MED ORDER — TRAMADOL HCL 50 MG PO TABS
50.0000 mg | ORAL_TABLET | Freq: Two times a day (BID) | ORAL | Status: DC | PRN
  Administered 2014-04-08: 50 mg via ORAL
  Filled 2014-04-08: qty 1

## 2014-04-08 MED ORDER — PRENATAL MULTIVITAMIN CH
1.0000 | ORAL_TABLET | Freq: Every day | ORAL | Status: DC
  Administered 2014-04-08: 1 via ORAL
  Filled 2014-04-08 (×2): qty 1

## 2014-04-08 MED ORDER — POTASSIUM CHLORIDE CRYS ER 20 MEQ PO TBCR
40.0000 meq | EXTENDED_RELEASE_TABLET | ORAL | Status: AC
  Administered 2014-04-08 (×2): 40 meq via ORAL
  Filled 2014-04-08 (×2): qty 2
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
## 2014-04-08 NOTE — Progress Notes (Signed)
[facility] PROGRESS NOTE  Jasmine [name] TGG:[id number] DOB: 29-Sep-1990 DOA: 04/07/2014 PCP: No PCP Per Patient  Assessment/Plan: 1-acute resp distress due to asthma exacerbation -continue solumedrol, start pulmicort and continue schedule/PRN nebulizer therapy -continue PRN oxygen supplementation and supportive care -influenza by PCR pending -will transfer out of stepdown  2-chronic back pain: uncontrolled. -will use lidoderm patch -patient allergic to tylenol and NSAID's                                                                                                                                                        3-hypokalemiamia: due to albuterol use most likely -will replete as needed  4-pregnant:: appears to be stable -has follow up in April with OB/GYN -continue folic acid and pre-natal vitamins  5-seizure disorder: will continue keppra     6-GI prophylaxis: will use PPI  7-chronic anemia: most likely associated with iron deficiency in pregnancy -will recommend MV with iron at discharge.                                                     Code Status: Full Family Communication: no family at bedside Disposition Plan: will transfer out of tep down    Consultants:  None   Procedures:  See below for x-ray reports   Antibiotics:  None   HPI/Subjective: Afebrile, awake and oriented X3; complaining of severe right side back pain. Patient reprots breathing is better.  Objective: Filed Vitals:   04/08/14 0800  BP:   Pulse:   Temp: 98.8 F (37.1 C)  Resp:     Intake/Output Summary (Last 24 hours) at 04/08/14 1019 Last data filed at 04/08/14 0600  Gross per 24 hour  Intake    200 ml  Output   1450 ml  Net  -1250 ml   Filed Weights   04/08/14 0000 04/08/14 0500  Weight: 73.3 kg (161 lb 9.6 oz) 73.3 kg (161 lb 9.6 oz)    Exam:   General:  Afebrile, breathing better and able to speak in full sentences. Still with exp wheezing and fair air  movement. Complaining of severe pain in her back   Cardiovascular: S1 and S2, no rubs or gallops  Respiratory: exp wheezing; decrease air movement, scattered rhonchi   Abdomen: soft, NT, ND, positive BS  Musculoskeletal: no edema, no cyanosis or clubbing   Data Reviewed: Basic Metabolic Panel:  Recent Labs Lab 04/07/14 2016 04/08/14 0650  NA 133* 135  K 2.9* 3.8  CL 102 104  CO2 22 20  GLUCOSE 130* 118*  BUN 7 <5*  CREATININE 0.54 0.47*  CALCIUM 9.2 9.4  MG  --  1.8  Liver Function Tests:  Recent Labs Lab 04/08/14 0650  AST 28  ALT 41*  ALKPHOS 43  BILITOT 0.5  PROT 7.0  ALBUMIN 4.0   CBC:  Recent Labs Lab 04/07/14 2016 04/08/14 0650  WBC 14.5* 19.9*  NEUTROABS 12.5* 18.0*  HGB 12.8 12.0  HCT 37.1 35.2*  MCV 85.9 85.0  PLT 215 224   Cardiac Enzymes:  Recent Labs Lab 04/08/14 0650  TROPONINI <0.03   CBG: No results for input(s): GLUCAP in the last 168 hours.  Recent Results (from the past 240 hour(s))  MRSA PCR Screening     Status: None   Collection Time: 04/08/14  1:07 AM  Result Value Ref Range Status   MRSA by PCR NEGATIVE NEGATIVE Final    Comment:        The GeneXpert MRSA Assay (FDA approved for NASAL specimens only), is one component of a comprehensive MRSA colonization surveillance program. It is not intended to diagnose MRSA infection nor to guide or monitor treatment for MRSA infections.      Studies: Dg Chest 2 View  04/07/2014   CLINICAL DATA:  One day history of difficulty breathing.  EXAM: CHEST  2 VIEW  COMPARISON:  January 12, 2014  FINDINGS: Lungs are clear. Heart size and pulmonary vascularity are normal. No adenopathy. No bone lesions.  IMPRESSION: No edema or consolidation.   Electronically Signed   By: Lowella Grip III M.D.   On: 04/07/2014 20:47    Scheduled Meds: . budesonide (PULMICORT) nebulizer solution  0.25 mg Nebulization BID  . folic acid  1 mg Oral Daily  . ipratropium-albuterol  3 mL  Nebulization Q4H  . levETIRAcetam  500 mg Oral BID  . methylPREDNISolone (SOLU-MEDROL) injection  60 mg Intravenous Q12H  . potassium chloride  40 mEq Oral Q4H  . prenatal multivitamin  1 tablet Oral Q1200  . sodium chloride  3 mL Intravenous Q12H   Continuous Infusions:   Principal Problem:   Asthma exacerbation Active Problems:   Seizure disorder, sept 2015 last seizure   Anemia   Pregnant, 8 weeks   Hypokalemia    Time spent:     Barton Dubois  [facility] Pager [phone]. If 7PM-7AM, please contact night-coverage at [email or web address], password [email or web address] 04/08/2014, 10:19 AM  LOS: 1 day

## 2014-04-08 NOTE — Progress Notes (Signed)
Received report from  [facility], Pt transferred from ICU, Alert and oriented, able to communicate needs. Will continue with current plan of care.

## 2014-04-08 NOTE — Progress Notes (Signed)
UR complete 

## 2014-04-09 DIAGNOSIS — R079 Chest pain, unspecified: Secondary | ICD-10-CM

## 2014-04-09 LAB — LEGIONELLA ANTIGEN, URINE

## 2014-04-09 LAB — BASIC METABOLIC PANEL
Anion gap: 9 (ref 5–15)
BUN: 7 mg/dL (ref 6–23)
CHLORIDE: 106 mmol/L (ref 96–112)
CO2: 22 mmol/L (ref 19–32)
CREATININE: 0.58 mg/dL (ref 0.50–1.10)
Calcium: 9.7 mg/dL (ref 8.4–10.5)
GFR calc Af Amer: >90 mL/min (ref >90)
GFR calc non Af Amer: >90 mL/min (ref >90)
Glucose, Bld: 127 mg/dL — ABNORMAL HIGH (ref 70–99)
Potassium: 4 mmol/L (ref 3.5–5.1)
Sodium: 137 mmol/L (ref 135–145)

## 2014-04-09 MED ORDER — FOLIC ACID 1 MG PO TABS: 1.0000 mg | ORAL_TABLET | Freq: Every day | ORAL | Status: DC

## 2014-04-09 MED ORDER — TAB-A-VITE/IRON PO TABS: 1.0000 | ORAL_TABLET | Freq: Every day | ORAL | Status: DC

## 2014-04-09 MED ORDER — LEVETIRACETAM 500 MG PO TABS: 500.0000 mg | ORAL_TABLET | Freq: Two times a day (BID) | ORAL | Status: DC

## 2014-04-09 MED ORDER — DIPHENHYDRAMINE HCL 25 MG PO CAPS
25.0000 mg | ORAL_CAPSULE | Freq: Once | ORAL | Status: AC
  Administered 2014-04-09: 25 mg via ORAL
  Filled 2014-04-09: qty 1

## 2014-04-09 MED ORDER — ALBUTEROL SULFATE HFA 108 (90 BASE) MCG/ACT IN AERS: 1.0000 | INHALATION_SPRAY | Freq: Four times a day (QID) | RESPIRATORY_TRACT | Status: DC | PRN

## 2014-04-09 MED ORDER — BACIT-POLY-NEO HC 1 % EX OINT: TOPICAL_OINTMENT | Freq: Two times a day (BID) | CUTANEOUS | Status: DC

## 2014-04-09 MED ORDER — HYDROCORTISONE 20 MG PO TABS: ORAL_TABLET | ORAL | Status: DC

## 2014-04-09 MED ORDER — FLUTICASONE-SALMETEROL 250-50 MCG/DOSE IN AEPB: 1.0000 | INHALATION_SPRAY | Freq: Two times a day (BID) | RESPIRATORY_TRACT | Status: DC

## 2014-04-09 MED ORDER — LIDOCAINE 5 % EX PTCH: 1.0000 | MEDICATED_PATCH | CUTANEOUS | Status: DC
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
## 2014-04-09 NOTE — Progress Notes (Signed)
Discharge instructions given to pt, verbalized understanding. Left the unit in stable condition. 

## 2014-05-13 ENCOUNTER — Encounter: Payer: Self-pay | Admitting: Family Medicine

## 2014-05-13 ENCOUNTER — Other Ambulatory Visit: Payer: Self-pay | Admitting: Family Medicine

## 2014-05-13 ENCOUNTER — Ambulatory Visit (INDEPENDENT_AMBULATORY_CARE_PROVIDER_SITE_OTHER): Payer: Medicaid Other | Admitting: Family Medicine

## 2014-05-13 ENCOUNTER — Other Ambulatory Visit (HOSPITAL_COMMUNITY)
Admission: RE | Admit: 2014-05-13 | Discharge: 2014-05-13 | Disposition: A | Discharge status: Home / Self Care | Payer: Medicaid Other | Source: Ambulatory Visit | Attending: Family Medicine | Admitting: Family Medicine

## 2014-05-13 ENCOUNTER — Ambulatory Visit (HOSPITAL_COMMUNITY): Payer: Medicaid Other

## 2014-05-13 ENCOUNTER — Other Ambulatory Visit (HOSPITAL_COMMUNITY): Payer: Medicaid Other

## 2014-05-13 VITALS — BP 109/65 | HR 78 | Temp 98.6°F | Wt 158.8 lb

## 2014-05-13 DIAGNOSIS — Z23 Encounter for immunization: Secondary | ICD-10-CM

## 2014-05-13 DIAGNOSIS — J45909 Unspecified asthma, uncomplicated: Secondary | ICD-10-CM

## 2014-05-13 DIAGNOSIS — O99350 Diseases of the nervous system complicating pregnancy, unspecified trimester: Secondary | ICD-10-CM | POA: Diagnosis not present

## 2014-05-13 DIAGNOSIS — O0990 Supervision of high risk pregnancy, unspecified, unspecified trimester: Secondary | ICD-10-CM | POA: Insufficient documentation

## 2014-05-13 DIAGNOSIS — O9989 Other specified diseases and conditions complicating pregnancy, childbirth and the puerperium: Secondary | ICD-10-CM | POA: Diagnosis not present

## 2014-05-13 DIAGNOSIS — O99519 Diseases of the respiratory system complicating pregnancy, unspecified trimester: Secondary | ICD-10-CM

## 2014-05-13 DIAGNOSIS — Z3492 Encounter for supervision of normal pregnancy, unspecified, second trimester: Secondary | ICD-10-CM

## 2014-05-13 DIAGNOSIS — Z113 Encounter for screening for infections with a predominantly sexual mode of transmission: Secondary | ICD-10-CM | POA: Diagnosis present

## 2014-05-13 DIAGNOSIS — Z91018 Allergy to other foods: Secondary | ICD-10-CM | POA: Diagnosis not present

## 2014-05-13 DIAGNOSIS — G40909 Epilepsy, unspecified, not intractable, without status epilepticus: Secondary | ICD-10-CM | POA: Diagnosis not present

## 2014-05-13 DIAGNOSIS — Z01419 Encounter for gynecological examination (general) (routine) without abnormal findings: Secondary | ICD-10-CM | POA: Insufficient documentation

## 2014-05-13 DIAGNOSIS — Z3682 Encounter for antenatal screening for nuchal translucency: Secondary | ICD-10-CM

## 2014-05-13 LAB — POCT URINALYSIS DIP (DEVICE)
BILIRUBIN URINE: NEGATIVE
GLUCOSE, UA: NEGATIVE mg/dL
Hgb urine dipstick: NEGATIVE
Ketones, ur: NEGATIVE mg/dL
Nitrite: NEGATIVE
Protein, ur: NEGATIVE mg/dL
SPECIFIC GRAVITY, URINE: 1.02 (ref 1.005–1.030)
Urobilinogen, UA: 0.2 mg/dL (ref 0.0–1.0)
pH: 6.5 (ref 5.0–8.0)

## 2014-05-13 MED ORDER — PRENATAL VITAMINS 0.8 MG PO TABS: 1.0000 | ORAL_TABLET | Freq: Every day | ORAL | Status: DC

## 2014-05-13 MED ORDER — EPINEPHRINE HCL 1 MG/ML IJ SOLN: 1.0000 mg | Freq: Once | INTRAMUSCULAR | Status: DC

## 2014-05-13 MED ORDER — DOXYLAMINE-PYRIDOXINE 10-10 MG PO TBEC: DELAYED_RELEASE_TABLET | ORAL | Status: DC

## 2014-05-13 NOTE — Progress Notes (Signed)
Subjective:    Jasmine [name] is a G3P2002 [redacted]w[redacted]d being seen today for her first obstetrical visit.  Her obstetrical history is significant for none. Patient does intend to breast feed. Pregnancy history fully reviewed.  Patient reports backache, fatigue, nausea and vomiting.  Seizure disorder: well controlled on keppra, last seizure Sept 2015.  Hx of chlamydia treated within past few months  Filed Vitals:   05/13/14 0836  BP: 109/65  Pulse: 78  Temp: 98.6 F (37 C)  Weight: 158 lb 12.8 oz (72.031 kg)    HISTORY: OB History  Gravida Para Term Preterm AB SAB TAB Ectopic Multiple Living  3 2 2       2     # Outcome Date GA Lbr Len/2nd Weight Sex Delivery Anes PTL Lv  3 Current           2 Term 09/04/11 [redacted]w[redacted]d  5 lb 14 oz (2.665 kg) F CS-LTranv Spinal Y Y  1 Term 08/28/10 [redacted]w[redacted]d  6 lb (2.722 kg) F Vag-Spont EPI N Y     Past Medical History  Diagnosis Date  . Asthma   . Seizures   . Sickle cell trait   . Pancreatitis   . Heart murmur    Past Surgical History  Procedure Laterality Date  . Nerve, tendon and artery repair Right 09/23/2012    Procedure: I&D and Repair As Necessary/Right Hand and Palm;  Surgeon: Roseanne Kaufman, MD;  Location: [facility];  Service: Orthopedics;  Laterality: Right;  . Tonsillectomy    . Hernia repair    . Cesarean section    . Hand surgery    . Appendectomy     Family History  Problem Relation Age of Onset  . Cancer Mother     cervical cancer  . Asthma Mother   . Hypertension Father   . Sickle cell anemia Father   . Asthma Sister   . Diabetes Maternal Aunt   . Cancer Maternal Grandmother      Exam    Uterus:     Pelvic Exam:    Perineum: No Hemorrhoids   Vulva: normal   Vagina:  normal mucosa       Cervix: multiparous appearance and no cervical motion tenderness, minimal spotting with pap   Adnexa: normal adnexa  System: Breast:  normal appearance, no masses or tenderness   Skin: normal coloration and turgor, no rashes    Neurologic: oriented, normal   Extremities: normal strength, tone, and muscle mass   HEENT PERRLA and extra ocular movement intact   Mouth/Teeth mucous membranes moist, pharynx normal without lesions   Neck supple and no masses   Cardiovascular: Regular rate   Respiratory:  appears well, vitals normal, no respiratory distress, acyanotic, normal RR, ear and throat exam is normal, neck free of mass or lymphadenopathy, chest clear, no wheezing, crepitations, rhonchi, normal symmetric air entry   Abdomen: soft, non-tender; bowel sounds normal; no masses,  no organomegaly   Urinary: urethral meatus normal      Assessment:    Pregnancy: [id number] Patient Active Problem List   Diagnosis Date Noted  . Pain in the chest   . Pregnant, 8 weeks 04/08/2014  . Hypokalemia 04/08/2014  . Asthma exacerbation 01/19/2013  . Seizure disorder, sept 2015 last seizure 01/19/2013  . Anemia 01/19/2013  . Leukocytosis, unspecified 01/19/2013        Plan:     Initial labs drawn. Prenatal vitamins. Problem list reviewed and updated. Genetic Screening discussed Quad Screen:  to be ordered at next. visit  Ultrasound discussed; fetal survey: requested.  Follow up in 4 weeks. 50% of 25 min visit spent on counseling and coordination of care.   Seizure disorder: well controlled on Keppra Hx of chlamydia, told RN was treated but did want to discuss in front of mother, pap and cultures done today rx diclegis and prenatal vitamins  Tlaloc Taddei ROCIO 05/13/2014  After visit mother asked RN for refill of epi pen 2/2 multiple anaphylactic allergies.  rx sent to pharmacy.  Interview difficult as mother answered all questions, asked questions and patient tried to facetime during interview.  Did not discuss hx of anaphylaxis with patient.  Merla Riches, MD

## 2014-05-13 NOTE — Progress Notes (Signed)
Small leuks on Udip

## 2014-05-13 NOTE — Progress Notes (Signed)
Nutrition note: 1st visit consult Pt has lost 1.2# @ [redacted]w[redacted]d, pt reports severe N&V as well as heartburn. Pt has many allergies (see list in allergies section). Pt reports trying to eat ~6x/d but hasn't kept much down due to her N&V. Pt has not started taking a PNV. Pt received verbal & written education on general nutrition during pregnancy. Discussed tips to decrease N&V. Encouraged PNV or 2 chewable multivitamins. Discussed wt gain goals of 15-25# or 0.6#/wk. Pt agrees to start taking a PNV. Pt has [place] & plans to BF. F/u as needed Vladimir Faster, MS, RD, LDN, [facility]

## 2014-05-13 NOTE — Progress Notes (Signed)
Initial OB visit Flu vaccine New OB educational material given Home Medicaid Form Completed New OB labs with 1 hour gtt Pt needs prescription for prenatal vitamins with iron Pap with cultures

## 2014-05-13 NOTE — Progress Notes (Signed)
Anatomy U/S 06/17/14 @ 1030a with Radiology.  Pt interested in 1st Trimester Screen, per Malachy Mood in [facility] pt can wait in lobby for possible cancellation, no guarantee pt will be seen today or tomorrow.  Pt agreeable to plan.

## 2014-05-14 LAB — PRENATAL PROFILE (SOLSTAS)
ANTIBODY SCREEN: NEGATIVE
BASOS ABS: 0 10*3/uL (ref 0.0–0.1)
Basophils Relative: 0 % (ref 0–1)
EOS ABS: 0.2 10*3/uL (ref 0.0–0.7)
EOS PCT: 2 % (ref 0–5)
HEMATOCRIT: 35.2 % — AB (ref 36.0–46.0)
HEMOGLOBIN: 11.8 g/dL — AB (ref 12.0–15.0)
HIV 1&2 Ab, 4th Generation: NONREACTIVE
Hepatitis B Surface Ag: NEGATIVE
LYMPHS ABS: 2.2 10*3/uL (ref 0.7–4.0)
Lymphocytes Relative: 18 % (ref 12–46)
MCH: 29.3 pg (ref 26.0–34.0)
MCHC: 33.5 g/dL (ref 30.0–36.0)
MCV: 87.3 fL (ref 78.0–100.0)
MONO ABS: 0.6 10*3/uL (ref 0.1–1.0)
MONOS PCT: 5 % (ref 3–12)
MPV: 10.9 fL (ref 8.6–12.4)
NEUTROS PCT: 75 % (ref 43–77)
Neutro Abs: 9.3 10*3/uL — ABNORMAL HIGH (ref 1.7–7.7)
Platelets: 224 10*3/uL (ref 150–400)
RBC: 4.03 MIL/uL (ref 3.87–5.11)
RDW: 14.4 % (ref 11.5–15.5)
RH TYPE: POSITIVE
Rubella: 2.86 Index — ABNORMAL HIGH (ref <0.90)
WBC: 12.4 10*3/uL — ABNORMAL HIGH (ref 4.0–10.5)

## 2014-05-14 LAB — CULTURE, OB URINE
Colony Count: NO GROWTH
Organism ID, Bacteria: NO GROWTH

## 2014-05-14 LAB — CYTOLOGY - PAP

## 2014-05-14 LAB — GLUCOSE TOLERANCE, 1 HOUR (50G) W/O FASTING: Glucose, 1 Hour GTT: 92 mg/dL (ref 70–140)

## 2014-05-16 LAB — CANNABANOIDS (GC/LC/MS), URINE: THC-COOH (GC/LC/MS), ur confirm: 402 ng/mL — AB (ref <5)

## 2014-05-18 LAB — PRESCRIPTION MONITORING PROFILE (19 PANEL)
Amphetamine/Meth: NEGATIVE ng/mL
Barbiturate Screen, Urine: NEGATIVE ng/mL
Benzodiazepine Screen, Urine: NEGATIVE ng/mL
Buprenorphine, Urine: NEGATIVE ng/mL
Carisoprodol, Urine: NEGATIVE ng/mL
Cocaine Metabolites: NEGATIVE ng/mL
Creatinine, Urine: 171.03 mg/dL (ref >20.0)
Fentanyl, Ur: NEGATIVE ng/mL
MDMA URINE: NEGATIVE ng/mL
METHADONE SCREEN, URINE: NEGATIVE ng/mL
Meperidine, Ur: NEGATIVE ng/mL
Methaqualone: NEGATIVE ng/mL
NITRITES URINE, INITIAL: NEGATIVE ug/mL
Opiate Screen, Urine: NEGATIVE ng/mL
Oxycodone Screen, Ur: NEGATIVE ng/mL
PH URINE, INITIAL: 6.8 pH (ref 4.5–8.9)
PROPOXYPHENE: NEGATIVE ng/mL
Phencyclidine, Ur: NEGATIVE ng/mL
TRAMADOL UR: NEGATIVE ng/mL
Tapentadol, urine: NEGATIVE ng/mL
Zolpidem, Urine: NEGATIVE ng/mL

## 2014-05-18 LAB — HEMOGLOBINOPATHY EVALUATION
HGB A: 96.9 % (ref 96.8–97.8)
Hemoglobin Other: 0 %
Hgb A2 Quant: 3.1 % (ref 2.2–3.2)
Hgb F Quant: 0 % (ref 0.0–2.0)
Hgb S Quant: 0 %

## 2014-05-19 ENCOUNTER — Encounter: Payer: Self-pay | Admitting: Family Medicine

## 2014-05-19 DIAGNOSIS — O99320 Drug use complicating pregnancy, unspecified trimester: Secondary | ICD-10-CM | POA: Insufficient documentation

## 2014-05-24 ENCOUNTER — Encounter: Payer: Self-pay | Admitting: Obstetrics & Gynecology

## 2014-06-10 ENCOUNTER — Ambulatory Visit (INDEPENDENT_AMBULATORY_CARE_PROVIDER_SITE_OTHER): Payer: Medicaid Other | Admitting: Obstetrics & Gynecology

## 2014-06-10 ENCOUNTER — Other Ambulatory Visit: Payer: Self-pay | Admitting: General Practice

## 2014-06-10 VITALS — BP 87/68 | HR 80 | Temp 98.6°F | Wt 155.9 lb

## 2014-06-10 DIAGNOSIS — G40909 Epilepsy, unspecified, not intractable, without status epilepticus: Secondary | ICD-10-CM

## 2014-06-10 DIAGNOSIS — Z3492 Encounter for supervision of normal pregnancy, unspecified, second trimester: Secondary | ICD-10-CM

## 2014-06-10 DIAGNOSIS — O99352 Diseases of the nervous system complicating pregnancy, second trimester: Secondary | ICD-10-CM

## 2014-06-10 DIAGNOSIS — O0992 Supervision of high risk pregnancy, unspecified, second trimester: Secondary | ICD-10-CM

## 2014-06-10 DIAGNOSIS — O0990 Supervision of high risk pregnancy, unspecified, unspecified trimester: Secondary | ICD-10-CM

## 2014-06-10 LAB — POCT URINALYSIS DIP (DEVICE)
Bilirubin Urine: NEGATIVE
Glucose, UA: NEGATIVE mg/dL
Hgb urine dipstick: NEGATIVE
Ketones, ur: NEGATIVE mg/dL
Nitrite: NEGATIVE
Protein, ur: NEGATIVE mg/dL
Specific Gravity, Urine: 1.02 (ref 1.005–1.030)
Urobilinogen, UA: 2 mg/dL — ABNORMAL HIGH (ref 0.0–1.0)
pH: 7 (ref 5.0–8.0)

## 2014-06-10 MED ORDER — PRENATAL VITAMINS 0.8 MG PO TABS: 1.0000 | ORAL_TABLET | Freq: Every day | ORAL | Status: DC

## 2014-06-10 MED ORDER — FAMOTIDINE 40 MG PO TABS
40.0000 mg | ORAL_TABLET | Freq: Every evening | ORAL | Status: DC
Start: 2014-06-10 — End: 2014-07-27

## 2014-06-10 MED ORDER — EPINEPHRINE HCL 1 MG/ML IJ SOLN: 1.0000 mg | Freq: Once | INTRAMUSCULAR | Status: DC

## 2014-06-10 MED ORDER — PROMETHAZINE HCL 25 MG PO TABS: 25.0000 mg | ORAL_TABLET | Freq: Four times a day (QID) | ORAL | Status: DC | PRN

## 2014-06-10 NOTE — Progress Notes (Signed)
Subjective:nausea   Jasmine [name] is a 24 y.o. [id number] [redacted]w[redacted]d being seen today for her obstetrical visit.  Patient reports nausea.  Denies contractions, vaginal bleeding or leaking of fluid.  Reports good fetal movement.  The following portions of the patient's history were reviewed and updated as appropriate: allergies, current medications, past family history, past medical history, past social history, past surgical history and problem list.   Objective:  BP 87/68 mmHg  Pulse 80  Temp(Src) 98.6 F (37 C)  Wt 155 lb 14.4 oz (70.716 kg)  LMP 01/24/2014  FHT: Fetal Heart Rate (bpm): 142  Uterine Size:  18 week  Fetal Movement: Movement: Present  Presentation:      Abdomen:  soft, gravid, appropriate for gestational age,non-tender         Results for orders placed or performed in visit on 06/10/14 (from the past 24 hour(s))  POCT urinalysis dip (device)     Status: Abnormal   Collection Time: 06/10/14  8:51 AM  Result Value Ref Range   Glucose, UA NEGATIVE NEGATIVE mg/dL   Bilirubin Urine NEGATIVE NEGATIVE   Ketones, ur NEGATIVE NEGATIVE mg/dL   Specific Gravity, Urine 1.020 1.005 - 1.030   Hgb urine dipstick NEGATIVE NEGATIVE   pH 7.0 5.0 - 8.0   Protein, ur NEGATIVE NEGATIVE mg/dL   Urobilinogen, UA 2.0 (H) 0.0 - 1.0 mg/dL   Nitrite NEGATIVE NEGATIVE   Leukocytes, UA MODERATE (A) NEGATIVE    Assessment and Plan:   Pregnancy:  G3P2002 at [redacted]w[redacted]d  1. Seizure disorder during pregnancy, antepartum, second trimester Stable, no activity  2. Supervision of high risk pregnancy, antepartum, second trimester Needs Pepcid 40 mg for reflux, phenergan prn nausea    Preterm labor symptoms: vaginal bleeding, contractions and leaking of fluid reviewed in detail.  Fetal movement precautions reviewed.  Follow up in 4 weeks  [place] at 18 weeks, Quad screen.  Woodroe Mode, MD 06/10/2014

## 2014-06-10 NOTE — Patient Instructions (Signed)
Second Trimester of Pregnancy The second trimester is from week 13 through week 28, months 4 through 6. The second trimester is often a time when you feel your best. Your body has also adjusted to being pregnant, and you begin to feel better physically. Usually, morning sickness has lessened or quit completely, you may have more energy, and you may have an increase in appetite. The second trimester is also a time when the fetus is growing rapidly. At the end of the sixth month, the fetus is about 9 inches long and weighs about 1 pounds. You will likely begin to feel the baby move (quickening) between 18 and 20 weeks of the pregnancy. BODY CHANGES Your body goes through many changes during pregnancy. The changes vary from woman to woman.   Your weight will continue to increase. You will notice your lower abdomen bulging out.  You may begin to get stretch marks on your hips, abdomen, and breasts.  You may develop headaches that can be relieved by medicines approved by your health care provider.  You may urinate more often because the fetus is pressing on your bladder.  You may develop or continue to have heartburn as a result of your pregnancy.  You may develop constipation because certain hormones are causing the muscles that push waste through your intestines to slow down.  You may develop hemorrhoids or swollen, bulging veins (varicose veins).  You may have back pain because of the weight gain and pregnancy hormones relaxing your joints between the bones in your pelvis and as a result of a shift in weight and the muscles that support your balance.  Your breasts will continue to grow and be tender.  Your gums may bleed and may be sensitive to brushing and flossing.  Dark spots or blotches (chloasma, mask of pregnancy) may develop on your face. This will likely fade after the baby is born.  A dark line from your belly button to the pubic area (linea nigra) may appear. This will likely fade  after the baby is born.  You may have changes in your hair. These can include thickening of your hair, rapid growth, and changes in texture. Some women also have hair loss during or after pregnancy, or hair that feels dry or thin. Your hair will most likely return to normal after your baby is born. WHAT TO EXPECT AT YOUR PRENATAL VISITS During a routine prenatal visit:  You will be weighed to make sure you and the fetus are growing normally.  Your blood pressure will be taken.  Your abdomen will be measured to track your baby's growth.  The fetal heartbeat will be listened to.  Any test results from the previous visit will be discussed. Your health care provider may ask you:  How you are feeling.  If you are feeling the baby move.  If you have had any abnormal symptoms, such as leaking fluid, bleeding, severe headaches, or abdominal cramping.  If you have any questions. Other tests that may be performed during your second trimester include:  Blood tests that check for:  Low iron levels (anemia).  Gestational diabetes (between 24 and 28 weeks).  Rh antibodies.  Urine tests to check for infections, diabetes, or protein in the urine.  An ultrasound to confirm the proper growth and development of the baby.  An amniocentesis to check for possible genetic problems.  Fetal screens for spina bifida and Down syndrome. HOME CARE INSTRUCTIONS   Avoid all smoking, herbs, alcohol, and unprescribed   drugs. These chemicals affect the formation and growth of the baby.  Follow your health care provider's instructions regarding medicine use. There are medicines that are either safe or unsafe to take during pregnancy.  Exercise only as directed by your health care provider. Experiencing uterine cramps is a good sign to stop exercising.  Continue to eat regular, healthy meals.  Wear a good support bra for breast tenderness.  Do not use hot tubs, steam rooms, or saunas.  Wear your  seat belt at all times when driving.  Avoid raw meat, uncooked cheese, cat litter boxes, and soil used by cats. These carry germs that can cause birth defects in the baby.  Take your prenatal vitamins.  Try taking a stool softener (if your health care provider approves) if you develop constipation. Eat more high-fiber foods, such as fresh vegetables or fruit and whole grains. Drink plenty of fluids to keep your urine clear or pale yellow.  Take warm sitz baths to soothe any pain or discomfort caused by hemorrhoids. Use hemorrhoid cream if your health care provider approves.  If you develop varicose veins, wear support hose. Elevate your feet for 15 minutes, 3-4 times a day. Limit salt in your diet.  Avoid heavy lifting, wear low heel shoes, and practice good posture.  Rest with your legs elevated if you have leg cramps or low back pain.  Visit your dentist if you have not gone yet during your pregnancy. Use a soft toothbrush to brush your teeth and be gentle when you floss.  A sexual relationship may be continued unless your health care provider directs you otherwise.  Continue to go to all your prenatal visits as directed by your health care provider. SEEK MEDICAL CARE IF:   You have dizziness.  You have mild pelvic cramps, pelvic pressure, or nagging pain in the abdominal area.  You have persistent nausea, vomiting, or diarrhea.  You have a bad smelling vaginal discharge.  You have pain with urination. SEEK IMMEDIATE MEDICAL CARE IF:   You have a fever.  You are leaking fluid from your vagina.  You have spotting or bleeding from your vagina.  You have severe abdominal cramping or pain.  You have rapid weight gain or loss.  You have shortness of breath with chest pain.  You notice sudden or extreme swelling of your face, hands, ankles, feet, or legs.  You have not felt your baby move in over an hour.  You have severe headaches that do not go away with  medicine.  You have vision changes. Document Released: 12/26/2000 Document Revised: 01/06/2013 Document Reviewed: 03/04/2012 ExitCare Patient Information 2015 [employer]. This information is not intended to replace advice given to you by your health care provider. Make sure you discuss any questions you have with your health care provider.  

## 2014-06-10 NOTE — Progress Notes (Signed)
Pt is not taking any medication, pharmacy will not give her any medication.  States some of the medication is the wrong dosage and some need pre authorization.  Urobilinogen 2.0, Leukocytes: Moderate  Pt desires Quad test today

## 2014-06-16 LAB — AFP, QUAD SCREEN
AFP: 23.1 ng/mL
Curr Gest Age: 17.5 wks.days
HCG, Total: 24.71 IU/mL
INH: 403 pg/mL
Interpretation-AFP: POSITIVE — AB
MOM FOR INH: 2.42
MoM for AFP: 0.49
MoM for hCG: 0.81
Open Spina bifida: NEGATIVE
Osb Risk: <1:54600 {titer}
Tri 18 Scr Risk Est: NEGATIVE
Trisomy 18 (Edward) Syndrome Interp.: 1:23300 {titer}
UE3 VALUE: 1.31 ng/mL
uE3 Mom: 1.08

## 2014-06-17 ENCOUNTER — Ambulatory Visit (HOSPITAL_COMMUNITY)
Admission: RE | Admit: 2014-06-17 | Discharge: 2014-06-17 | Disposition: A | Discharge status: Home / Self Care | Payer: Medicaid Other | Source: Ambulatory Visit | Attending: Family Medicine | Admitting: Family Medicine

## 2014-06-17 ENCOUNTER — Other Ambulatory Visit: Payer: Self-pay | Admitting: Family Medicine

## 2014-06-17 DIAGNOSIS — O281 Abnormal biochemical finding on antenatal screening of mother: Secondary | ICD-10-CM

## 2014-06-17 DIAGNOSIS — O0992 Supervision of high risk pregnancy, unspecified, second trimester: Secondary | ICD-10-CM

## 2014-06-17 DIAGNOSIS — O35BXX0 Maternal care for other (suspected) fetal abnormality and damage, fetal cardiac anomalies, not applicable or unspecified: Secondary | ICD-10-CM

## 2014-06-17 DIAGNOSIS — O351XX0 Maternal care for (suspected) chromosomal abnormality in fetus, not applicable or unspecified: Secondary | ICD-10-CM | POA: Diagnosis not present

## 2014-06-17 DIAGNOSIS — Z3689 Encounter for other specified antenatal screening: Secondary | ICD-10-CM | POA: Insufficient documentation

## 2014-06-17 DIAGNOSIS — Z3A18 18 weeks gestation of pregnancy: Secondary | ICD-10-CM

## 2014-06-17 DIAGNOSIS — O358XX0 Maternal care for other (suspected) fetal abnormality and damage, not applicable or unspecified: Secondary | ICD-10-CM | POA: Insufficient documentation

## 2014-06-17 DIAGNOSIS — Z3492 Encounter for supervision of normal pregnancy, unspecified, second trimester: Secondary | ICD-10-CM

## 2014-06-17 DIAGNOSIS — O280 Abnormal hematological finding on antenatal screening of mother: Secondary | ICD-10-CM

## 2014-06-17 DIAGNOSIS — Z315 Encounter for genetic counseling: Secondary | ICD-10-CM | POA: Insufficient documentation

## 2014-06-17 NOTE — Progress Notes (Signed)
Genetic Counseling  High-Risk Gestation Note  Appointment Date:  06/17/2014 Referred By: Nila Nephew, MD Date of Birth:  16-Jun-1990   Pregnancy History: [id number] Estimated Date of Delivery: 11/13/14 Estimated Gestational Age: [redacted]w[redacted]d Attending: Benjaman Lobe, MD   Ms. Jasmine [name] was seen for genetic counseling because of an increased risk for fetal Down syndrome based on Quad screen through [employer]. She was accompanied by her mother, Jasmine [name], to today's visit.   In Summary:   Reviewed 1:61 Down syndrome risk from Quad  Anatomy ultrasound in [facility]iology visualized echogenic intracardiac focus  Patient elected for noninvasive prenatal screening (NIPS), will consider amniocentesis pending these results  Follow-up ultrasound scheduled in 6 weeks given elevated DIA on Quad screen  They were counseled regarding the Quad screen result and the associated 1 in 61 risk for fetal Down syndrome.  We reviewed chromosomes, nondisjunction, and the common features and variable prognosis of Down syndrome.  In addition, we reviewed the screen adjusted reduction in risks for trisomy 18 (1 in 23,300) and ONTDs.  We also discussed other explanations for a screen positive result including: a gestational dating error, differences in maternal metabolism, and normal variation. They understand that this screening is not diagnostic for Down syndrome but provides a risk assessment.  We reviewed available screening options including noninvasive prenatal screening (NIPS)/cell free DNA (cfDNA) testing, and detailed ultrasound.  They were counseled that screening tests are used to modify a patient's a priori risk for aneuploidy, typically based on age. This estimate provides a pregnancy specific risk assessment. We reviewed the benefits and limitations of each option. Specifically, we discussed the conditions for which each test screens, the detection rates, and false positive rates of each.  They were also counseled regarding diagnostic testing via amniocentesis. We reviewed the approximate 1 in 454-098 risk for complications for amniocentesis, including spontaneous pregnancy loss.   Jasmine [name] had anatomy ultrasound performed in [facility] Radiology Department. We reviewed these results with her. Visualized fetal anatomy appeared normal. Echogenic intracardiac focus was visualized at this time. Complete ultrasound results reported separately. An isolated echogenic focus is generally believed to be a normal variation without any concerns for the pregnancy.  Isolated echogenic cardiac foci are not associated with congenital heart defects in the baby or compromised cardiac function after birth.  However, an echogenic cardiac focus is associated with a slightly increased chance for Down syndrome in the pregnancy. Thus, the presence of an EIF would increase the risk for Down syndrome above the patient's Quad screen result of 1 in 61 to approximately 1 in 31.    After consideration of all the options, she elected to proceed with NIPS (Panorama through [facility] laboratory).  Those results will be available in 8-10 days. Jasmine [name] gave [place] permission to also speak with her mother, Jasmine [name] on the phone regarding these results.  Diagnostic testing was declined today.  She will consider amniocentesis pending results of NIPS.  She understands that screening tests cannot rule out all birth defects or genetic syndromes. The patient was advised of this limitation and states she still does not want additional testing at this time.   Jasmine [name] was provided with written information regarding sickle cell anemia (SCA) including the carrier frequency and incidence in the African-American population, the availability of carrier testing and prenatal diagnosis if indicated.  In addition, we discussed that hemoglobinopathies are routinely screened for as part of the [place] newborn screening panel.  She  previously had hemoglobin  electrophoresis through her OB, which indicated that presence of normal adult hemoglobin.   Both family histories were reviewed and found to be contributory for history of seizures for Jasmine [name] of unknown etiology. Epilepsy occurs in approximately 1% of the population and can have many causes.  Approximately 80% of epilepsy is thought to be idiopathic while the remaining 20% is secondary to a variety of factors such as perinatal events, infections, trauma and genetic disease.  A specific diagnosis in an affected individual is necessary to accurately assess the risk for other family members to develop epilepsy.  In the absence of a known etiology, epilepsy is thought to be caused by a combination of genetic and environmental factors, called multifactorial inheritance. Recurrence risk for epilepsy is estimated to be 4% for offspring of an individual with primary idiopathic epilepsy. It would be important for the patient's pediatrician to be aware of this history so that their children can be screened and followed appropriately.    Jasmine [name] was not familiar with the father of the baby's family history, given that he was adopted.  We, therefore, cannot comment on how his history might contribute to the overall chance for the baby to have a birth defect.  Without further information regarding the provided family history, an accurate genetic risk cannot be calculated. Further genetic counseling is warranted if more information is obtained.  Jasmine [name] denied exposure to environmental toxins or chemical agents. She denied the use of alcohol, tobacco or street drugs. She denied significant viral illnesses during the course of her pregnancy. Her medical and surgical histories were contributory for seizure disorder, for which she is currently treated with Keppra. Jasmine [name] also reported a history of asthma and reported that she has an inhaler for treatment. Limited information is  available regarding use of Keppra in pregnancy. Available animal study data and a series reporting on 13 human pregnancies indicated a possible association with growth impairment. Current registry information is not sufficient to indicate whether or not there is an increased risk for birth defects with prenatal Keppra use. Even though a limited number of medicines are known to cause birth defects, we cannot say that it is completely safe to use any medicines during pregnancy.  It is also not possible to predict any drug-drug interactions that occur, or how they might affect a pregnancy.  The use of medications is recommended in pregnancy only if the benefit to the mother (and thus the pregnancy) outweighs potential risks to the baby.  Sometimes the maternal use of medications, dictated by a medical condition, may even be more beneficial to a pregnancy than not taking the medication(s) at all.  I counseled Jasmine [name] for approximately 45 minutes regarding the above risks and available options.   Chipper Oman, MS,  Certified Genetic Counselor 06/17/2014

## 2014-06-17 NOTE — Progress Notes (Signed)
Patient to Genetic Counseling in MFM after [place] due to abnormal QUAD screen.  OK per Dr. Ihor Dow.

## 2014-06-23 ENCOUNTER — Encounter (HOSPITAL_COMMUNITY): Payer: Self-pay | Admitting: Emergency Medicine

## 2014-06-23 ENCOUNTER — Emergency Department (HOSPITAL_COMMUNITY)
Admission: EM | Admit: 2014-06-23 | Discharge: 2014-06-23 | Disposition: A | Discharge status: Home / Self Care | Payer: Medicaid Other | Attending: Emergency Medicine | Admitting: Emergency Medicine

## 2014-06-23 DIAGNOSIS — G40909 Epilepsy, unspecified, not intractable, without status epilepticus: Secondary | ICD-10-CM | POA: Insufficient documentation

## 2014-06-23 DIAGNOSIS — Z87891 Personal history of nicotine dependence: Secondary | ICD-10-CM | POA: Diagnosis not present

## 2014-06-23 DIAGNOSIS — Z880 Allergy status to penicillin: Secondary | ICD-10-CM | POA: Diagnosis not present

## 2014-06-23 DIAGNOSIS — R000 Tachycardia, unspecified: Secondary | ICD-10-CM | POA: Diagnosis not present

## 2014-06-23 DIAGNOSIS — Z862 Personal history of diseases of the blood and blood-forming organs and certain disorders involving the immune mechanism: Secondary | ICD-10-CM | POA: Insufficient documentation

## 2014-06-23 DIAGNOSIS — Z79899 Other long term (current) drug therapy: Secondary | ICD-10-CM | POA: Insufficient documentation

## 2014-06-23 DIAGNOSIS — R011 Cardiac murmur, unspecified: Secondary | ICD-10-CM | POA: Insufficient documentation

## 2014-06-23 DIAGNOSIS — Z8719 Personal history of other diseases of the digestive system: Secondary | ICD-10-CM | POA: Diagnosis not present

## 2014-06-23 DIAGNOSIS — J45901 Unspecified asthma with (acute) exacerbation: Secondary | ICD-10-CM | POA: Insufficient documentation

## 2014-06-23 DIAGNOSIS — R0602 Shortness of breath: Secondary | ICD-10-CM | POA: Diagnosis present

## 2014-06-23 DIAGNOSIS — Z91040 Latex allergy status: Secondary | ICD-10-CM | POA: Insufficient documentation

## 2014-06-23 DIAGNOSIS — J45909 Unspecified asthma, uncomplicated: Secondary | ICD-10-CM

## 2014-06-23 MED ORDER — ALBUTEROL SULFATE HFA 108 (90 BASE) MCG/ACT IN AERS
2.0000 | INHALATION_SPRAY | RESPIRATORY_TRACT | Status: DC
  Administered 2014-06-23: 2 via RESPIRATORY_TRACT
  Filled 2014-06-23: qty 6.7

## 2014-06-23 MED ORDER — PREDNISOLONE 15 MG/5ML PO SOLN: 40.0000 mg | Freq: Every day | ORAL | Status: AC

## 2014-06-23 MED ORDER — PREDNISOLONE SODIUM PHOSPHATE 15 MG/5ML PO SOLN
40.0000 mg | Freq: Once | ORAL | Status: AC
  Administered 2014-06-23: 40 mg via ORAL
  Filled 2014-06-23: qty 15

## 2014-06-23 MED ORDER — ALBUTEROL SULFATE (2.5 MG/3ML) 0.083% IN NEBU
5.0000 mg | INHALATION_SOLUTION | Freq: Once | RESPIRATORY_TRACT | Status: AC
  Administered 2014-06-23: 5 mg via RESPIRATORY_TRACT
  Filled 2014-06-23: qty 6

## 2014-06-23 MED ORDER — IPRATROPIUM BROMIDE 0.02 % IN SOLN
0.5000 mg | Freq: Once | RESPIRATORY_TRACT | Status: AC
  Administered 2014-06-23: 0.5 mg via RESPIRATORY_TRACT
  Filled 2014-06-23: qty 2.5

## 2014-06-23 MED ORDER — ALBUTEROL SULFATE HFA 108 (90 BASE) MCG/ACT IN AERS: 1.0000 | INHALATION_SPRAY | Freq: Four times a day (QID) | RESPIRATORY_TRACT | Status: DC | PRN

## 2014-06-23 NOTE — Discharge Instructions (Signed)

## 2014-06-23 NOTE — ED Notes (Signed)
Pt c/o problems with her asthma since Monday. Pt doesn't have an inhaler at home. Pt is 19wk and 5 days pregnant.

## 2014-06-23 NOTE — ED Notes (Signed)
Bed: TV15 Expected date:  Expected time:  Means of arrival:  Comments: Hold for TR 8

## 2014-06-23 NOTE — ED Provider Notes (Signed)
CSN: [id number]     Arrival date & time 06/23/14  1802 History   First MD Initiated Contact with Patient 06/23/14 1851     Chief Complaint  Patient presents with  . Asthma     (Consider location/radiation/quality/duration/timing/severity/associated sxs/prior Treatment) HPI Comments: Patient here complaining of shortness of breath associated with her asthma 3 days. Has been out of her inhaler for over a month. Notes nonproductive cough without fever or chills. She is currently [redacted] weeks pregnant but denies any vaginal bleeding or abdominal cramping. Symptoms have been persistent and no no asthma triggers. Hasn't used anything for this prior to arrival.  Patient is a 24 y.o. female presenting with asthma. The history is provided by the patient.  Asthma    Past Medical History  Diagnosis Date  . Asthma   . Seizures   . Sickle cell trait   . Pancreatitis   . Heart murmur    Past Surgical History  Procedure Laterality Date  . Nerve, tendon and artery repair Right 09/23/2012    Procedure: I&D and Repair As Necessary/Right Hand and Palm;  Surgeon: Roseanne Kaufman, MD;  Location: [facility];  Service: Orthopedics;  Laterality: Right;  . Tonsillectomy    . Hernia repair    . Cesarean section    . Hand surgery    . Appendectomy     Family History  Problem Relation Age of Onset  . Cancer Mother     cervical cancer  . Asthma Mother   . Hypertension Father   . Sickle cell anemia Father   . Asthma Sister   . Diabetes Maternal Aunt   . Cancer Maternal Grandmother    History  Substance Use Topics  . Smoking status: Former Smoker    Types: Cigarettes  . Smokeless tobacco: Never Used  . Alcohol Use: No   OB History    Gravida Para Term Preterm AB TAB SAB Ectopic Multiple Living   3 2 2       2      Review of Systems  All other systems reviewed and are negative.     Allergies  Avocado; Cheese; Chocolate; Ibuprofen; Orange juice; Other; Peach; Peanuts; Pear; Prednisone; Raspberry;  Tylenol; Amoxicillin; Doxycycline; Erythromycin; Ivp dye; Latex; and Penicillins  Home Medications   Prior to Admission medications   Medication Sig Start Date End Date Taking? Authorizing Provider  EPINEPHrine (ADRENALIN) 1 MG/ML injection Inject 1 mL (1 mg total) into the muscle once. 06/10/14  Yes Nila Nephew, MD  levETIRAcetam (KEPPRA) 500 MG tablet Take 1 tablet (500 mg total) by mouth 2 (two) times daily. 04/09/14  Yes Barton Dubois, MD  Prenatal Multivit-Min-Fe-FA (PRENATAL VITAMINS) 0.8 MG tablet Take 1 tablet by mouth daily. 06/10/14  Yes Nila Nephew, MD  promethazine (PHENERGAN) 25 MG tablet Take 1 tablet (25 mg total) by mouth every 6 (six) hours as needed for nausea or vomiting. 06/10/14  Yes Woodroe Mode, MD  albuterol (PROVENTIL HFA;VENTOLIN HFA) 108 (90 BASE) MCG/ACT inhaler Inhale 1-2 puffs into the lungs every 6 (six) hours as needed for wheezing or shortness of breath. 04/09/14   Barton Dubois, MD  famotidine (PEPCID) 40 MG tablet Take 1 tablet (40 mg total) by mouth every evening. Patient not taking: Reported on 06/23/2014 06/10/14 06/10/15  Woodroe Mode, MD  Fluticasone-Salmeterol (ADVAIR DISKUS) 250-50 MCG/DOSE AEPB Inhale 1 puff into the lungs 2 (two) times daily. Patient not taking: Reported on 06/10/2014 04/09/14   Barton Dubois, MD  ranitidine (ZANTAC) 150  MG tablet Take 1 tablet (150 mg total) by mouth 2 (two) times daily. Patient not taking: Reported on 06/10/2014 03/24/14   Lattie Haw A Leftwich-Kirby, CNM   BP 125/66 mmHg  Pulse 113  Temp(Src) 98.8 F (37.1 C) (Oral)  Resp 18  SpO2 97%  LMP 01/24/2014 Physical Exam  Constitutional: She is oriented to person, place, and time. She appears well-developed and well-nourished.  Non-toxic appearance. No distress.  HENT:  Head: Normocephalic and atraumatic.  Eyes: Conjunctivae, EOM and lids are normal. Pupils are equal, round, and reactive to light.  Neck: Normal range of motion. Neck supple. No tracheal deviation present. No  thyroid mass present.  Cardiovascular: Regular rhythm and normal heart sounds.  Tachycardia present.  Exam reveals no gallop.   No murmur heard. Pulmonary/Chest: Effort normal. No stridor. No respiratory distress. She has decreased breath sounds. She has wheezes. She has no rhonchi. She has no rales.  Abdominal: Soft. Normal appearance and bowel sounds are normal. She exhibits no distension. There is no tenderness. There is no rebound and no CVA tenderness.  Musculoskeletal: Normal range of motion. She exhibits no edema or tenderness.  Neurological: She is alert and oriented to person, place, and time. She has normal strength. No cranial nerve deficit or sensory deficit. GCS eye subscore is 4. GCS verbal subscore is 5. GCS motor subscore is 6.  Skin: Skin is warm and dry. No abrasion and no rash noted.  Psychiatric: She has a normal mood and affect. Her speech is normal and behavior is normal.  Nursing note and vitals reviewed.   ED Course  Procedures (including critical care time) Labs Review Labs Reviewed - No data to display  Imaging Review No results found.   EKG Interpretation None      MDM   Final diagnoses:  None   patient given albuterol treatment along with Orapred which she says she can take. Heart rate has improved. Repeat lung exam is improved as well to. She will be given a prescription for Orapred as well as albuterol inhaler to go home with    Lacretia Leigh, MD 06/23/14 2022

## 2014-06-24 ENCOUNTER — Other Ambulatory Visit (HOSPITAL_COMMUNITY): Payer: Self-pay | Admitting: Family Medicine

## 2014-06-24 ENCOUNTER — Telehealth (HOSPITAL_COMMUNITY): Payer: Self-pay | Admitting: MS"

## 2014-06-24 NOTE — Telephone Encounter (Signed)
Called Jasmine [name] to discuss her cell free fetal DNA test results.  Mrs. Jasmine [name] had Panorama testing through [facility].  Testing was offered because of Down syndrome risk from Quad screen and echogenic intracardiac focus on ultrasound.   The patient was identified by name and DOB.  We reviewed that these are within normal limits, showing a less than 1 in 10,000 risk for trisomies 21, 18 and 13, and monosomy X (Turner syndrome).  In addition, the risk for triploidy/vanishing twin and sex chromosome trisomies (47,XXX and 47,XXY) was also low risk.  We reviewed that this testing identifies > 99% of pregnancies with trisomy 66, trisomy 15, sex chromosome trisomies (47,XXX and 47,XXY), and triploidy. The detection rate for trisomy 18 is 96%.  The detection rate for monosomy X is ~92%.  The false positive rate is <0.1% for all conditions. Testing was also consistent with female fetal sex.  The patient did wish to know fetal sex.  She understands that this testing does not identify all genetic conditions.  All questions were answered to her satisfaction, she was encouraged to call with additional questions or concerns.  Chipper Oman, MS Certified Genetic Counselor 06/24/2014 9:12 AM

## 2014-07-03 ENCOUNTER — Encounter (HOSPITAL_COMMUNITY): Payer: Self-pay | Admitting: Emergency Medicine

## 2014-07-03 ENCOUNTER — Emergency Department (HOSPITAL_COMMUNITY): Payer: Medicaid Other

## 2014-07-03 ENCOUNTER — Inpatient Hospital Stay (HOSPITAL_COMMUNITY)
Admission: EM | Admit: 2014-07-03 | Discharge: 2014-07-06 | DRG: 781 | Disposition: A | Discharge status: Home / Self Care | Payer: Medicaid Other | Attending: Internal Medicine | Admitting: Internal Medicine

## 2014-07-03 DIAGNOSIS — Z87891 Personal history of nicotine dependence: Secondary | ICD-10-CM

## 2014-07-03 DIAGNOSIS — Z825 Family history of asthma and other chronic lower respiratory diseases: Secondary | ICD-10-CM

## 2014-07-03 DIAGNOSIS — R0602 Shortness of breath: Secondary | ICD-10-CM

## 2014-07-03 DIAGNOSIS — O281 Abnormal biochemical finding on antenatal screening of mother: Secondary | ICD-10-CM

## 2014-07-03 DIAGNOSIS — Z79899 Other long term (current) drug therapy: Secondary | ICD-10-CM

## 2014-07-03 DIAGNOSIS — Z91018 Allergy to other foods: Secondary | ICD-10-CM | POA: Diagnosis present

## 2014-07-03 DIAGNOSIS — Z91040 Latex allergy status: Secondary | ICD-10-CM

## 2014-07-03 DIAGNOSIS — O358XX1 Maternal care for other (suspected) fetal abnormality and damage, fetus 1: Secondary | ICD-10-CM

## 2014-07-03 DIAGNOSIS — O35BXX1 Maternal care for other (suspected) fetal abnormality and damage, fetal cardiac anomalies, fetus 1: Secondary | ICD-10-CM

## 2014-07-03 DIAGNOSIS — O99512 Diseases of the respiratory system complicating pregnancy, second trimester: Principal | ICD-10-CM | POA: Diagnosis present

## 2014-07-03 DIAGNOSIS — Z832 Family history of diseases of the blood and blood-forming organs and certain disorders involving the immune mechanism: Secondary | ICD-10-CM

## 2014-07-03 DIAGNOSIS — Z8249 Family history of ischemic heart disease and other diseases of the circulatory system: Secondary | ICD-10-CM

## 2014-07-03 DIAGNOSIS — Z3A21 21 weeks gestation of pregnancy: Secondary | ICD-10-CM | POA: Diagnosis present

## 2014-07-03 DIAGNOSIS — O280 Abnormal hematological finding on antenatal screening of mother: Secondary | ICD-10-CM

## 2014-07-03 DIAGNOSIS — J45909 Unspecified asthma, uncomplicated: Secondary | ICD-10-CM

## 2014-07-03 DIAGNOSIS — D72829 Elevated white blood cell count, unspecified: Secondary | ICD-10-CM | POA: Diagnosis present

## 2014-07-03 DIAGNOSIS — E873 Alkalosis: Secondary | ICD-10-CM | POA: Diagnosis present

## 2014-07-03 DIAGNOSIS — O99350 Diseases of the nervous system complicating pregnancy, unspecified trimester: Secondary | ICD-10-CM | POA: Diagnosis present

## 2014-07-03 DIAGNOSIS — O99519 Diseases of the respiratory system complicating pregnancy, unspecified trimester: Secondary | ICD-10-CM

## 2014-07-03 DIAGNOSIS — K219 Gastro-esophageal reflux disease without esophagitis: Secondary | ICD-10-CM | POA: Diagnosis present

## 2014-07-03 DIAGNOSIS — Z833 Family history of diabetes mellitus: Secondary | ICD-10-CM

## 2014-07-03 DIAGNOSIS — E876 Hypokalemia: Secondary | ICD-10-CM | POA: Diagnosis present

## 2014-07-03 DIAGNOSIS — G40909 Epilepsy, unspecified, not intractable, without status epilepticus: Secondary | ICD-10-CM

## 2014-07-03 DIAGNOSIS — D573 Sickle-cell trait: Secondary | ICD-10-CM | POA: Diagnosis present

## 2014-07-03 DIAGNOSIS — J45901 Unspecified asthma with (acute) exacerbation: Secondary | ICD-10-CM | POA: Diagnosis present

## 2014-07-03 HISTORY — DX: Gastro-esophageal reflux disease without esophagitis: K21.9

## 2014-07-03 MED ORDER — ALBUTEROL (5 MG/ML) CONTINUOUS INHALATION SOLN
10.0000 mg/h | INHALATION_SOLUTION | RESPIRATORY_TRACT | Status: DC
  Administered 2014-07-03: 10 mg/h via RESPIRATORY_TRACT
  Filled 2014-07-03: qty 20

## 2014-07-03 MED ORDER — ALBUTEROL SULFATE (2.5 MG/3ML) 0.083% IN NEBU
5.0000 mg | INHALATION_SOLUTION | Freq: Once | RESPIRATORY_TRACT | Status: AC
  Administered 2014-07-03: 5 mg via RESPIRATORY_TRACT
  Filled 2014-07-03: qty 6

## 2014-07-03 MED ORDER — METHYLPREDNISOLONE SODIUM SUCC 125 MG IJ SOLR
125.0000 mg | Freq: Once | INTRAMUSCULAR | Status: AC
  Administered 2014-07-03: 125 mg via INTRAVENOUS
  Filled 2014-07-03: qty 2

## 2014-07-03 MED ORDER — IPRATROPIUM BROMIDE 0.02 % IN SOLN
0.5000 mg | Freq: Once | RESPIRATORY_TRACT | Status: AC
  Administered 2014-07-03: 0.5 mg via RESPIRATORY_TRACT
  Filled 2014-07-03: qty 2.5

## 2014-07-03 NOTE — ED Notes (Signed)
Pt c/o chest tightness again, will notify PA Joe.

## 2014-07-03 NOTE — ED Notes (Signed)
Pt states was seen here Tuesday for same asthma attack. States this attack has been going on since before Tuesday and has not improved. Pt does not appear to be in any obvious distress at time of triage, O2 sat 100%. However lung sounds very diminished bilaterally, mild wheezing apparent in right lung

## 2014-07-03 NOTE — ED Notes (Addendum)
Xray tech at pt bedside to take pt to chest xray. Breathing treatment finished and oxygen was turned off.

## 2014-07-03 NOTE — ED Notes (Signed)
Called to [facility] to check FHR. Pt here with asthma attack. ED unable to find FHR. FHR 148-150 x 1 minute, no audible decels. Pt with no c/o obstetric issues.

## 2014-07-03 NOTE — ED Notes (Addendum)
Pt is  [redacted] weeks pregnant

## 2014-07-03 NOTE — ED Notes (Signed)
OB nurse at pt bedside. She states that the fetal heart rate ranges from 140-150 bpm.

## 2014-07-03 NOTE — ED Provider Notes (Signed)
CSN: [id number]     Arrival date & time 07/03/14  1903 History   First MD Initiated Contact with Patient 07/03/14 1935     Chief Complaint  Patient presents with  . Shortness of Breath  . Asthma     (Consider location/radiation/quality/duration/timing/severity/associated sxs/prior Treatment) HPI Patient is a 24 year old female past medical history of asthma, who presents the ER complaining of shortness of breath associated with her asthma. Patient states her symptoms have been persistent over the past 2 weeks and worsening. Patient states she was here on 06/23/14, given nebulizer treatment and prednisone. Patient states that prednisone gives her hives, she was unable to take it. She states since leaving her symptoms have progressively worsened again. Patient reports using her inhaler at home multiple times through the day, unable to count the amount she is been using it. Patient is a she is currently [redacted] weeks pregnant, denies any vaginal bleeding or abdominal cramping. Denies contractions or vaginal discharge.  Past Medical History  Diagnosis Date  . Asthma   . Seizures   . Sickle cell trait   . Pancreatitis   . Heart murmur   . GERD (gastroesophageal reflux disease)    Past Surgical History  Procedure Laterality Date  . Nerve, tendon and artery repair Right 09/23/2012    Procedure: I&D and Repair As Necessary/Right Hand and Palm;  Surgeon: Roseanne Kaufman, MD;  Location: [place];  Service: Orthopedics;  Laterality: Right;  . Tonsillectomy    . Hernia repair    . Cesarean section    . Hand surgery    . Appendectomy     Family History  Problem Relation Age of Onset  . Cancer Mother     cervical cancer  . Asthma Mother   . Hypertension Father   . Sickle cell anemia Father   . Asthma Sister   . Diabetes Maternal Aunt   . Cancer Maternal Grandmother    History  Substance Use Topics  . Smoking status: Former Smoker    Types: Cigarettes  . Smokeless tobacco: Never Used  . Alcohol  Use: No   OB History    Gravida Para Term Preterm AB TAB SAB Ectopic Multiple Living   3 2 2       2      Review of Systems  Constitutional: Negative for fever.  HENT: Negative for trouble swallowing.   Eyes: Negative for visual disturbance.  Respiratory: Positive for chest tightness and shortness of breath.   Cardiovascular: Negative for chest pain.  Gastrointestinal: Negative for nausea, vomiting and abdominal pain.  Genitourinary: Negative for dysuria.  Musculoskeletal: Negative for neck pain.  Skin: Negative for rash.  Neurological: Negative for dizziness, weakness and numbness.  Psychiatric/Behavioral: Negative.     Allergies  Avocado; Cheese; Chocolate; Ibuprofen; Orange juice; Other; Peach; Peanuts; Pear; Prednisone; Raspberry; Tylenol; Amoxicillin; Doxycycline; Erythromycin; Ivp dye; Latex; and Penicillins  Home Medications   Prior to Admission medications   Medication Sig Start Date End Date Taking? Authorizing Provider  albuterol (PROVENTIL HFA;VENTOLIN HFA) 108 (90 BASE) MCG/ACT inhaler Inhale 1-2 puffs into the lungs every 6 (six) hours as needed for wheezing or shortness of breath. 06/23/14  Yes Lacretia Leigh, MD  EPINEPHrine (ADRENALIN) 1 MG/ML injection Inject 1 mL (1 mg total) into the muscle once. 06/10/14  Yes Nila Nephew, MD  levETIRAcetam (KEPPRA) 500 MG tablet Take 1 tablet (500 mg total) by mouth 2 (two) times daily. 04/09/14  Yes Barton Dubois, MD  Prenatal Multivit-Min-Fe-FA (PRENATAL VITAMINS) 0.8  MG tablet Take 1 tablet by mouth daily. 06/10/14  Yes Nila Nephew, MD  famotidine (PEPCID) 40 MG tablet Take 1 tablet (40 mg total) by mouth every evening. Patient not taking: Reported on 06/23/2014 06/10/14 06/10/15  Woodroe Mode, MD  Fluticasone-Salmeterol (ADVAIR DISKUS) 250-50 MCG/DOSE AEPB Inhale 1 puff into the lungs 2 (two) times daily. Patient not taking: Reported on 06/10/2014 04/09/14   Barton Dubois, MD  promethazine (PHENERGAN) 25 MG tablet Take 1 tablet  (25 mg total) by mouth every 6 (six) hours as needed for nausea or vomiting. Patient not taking: Reported on 07/03/2014 06/10/14   Woodroe Mode, MD  ranitidine (ZANTAC) 150 MG tablet Take 1 tablet (150 mg total) by mouth 2 (two) times daily. Patient not taking: Reported on 06/10/2014 03/24/14   Lattie Haw A Leftwich-Kirby, CNM   BP 130/61 mmHg  Pulse 95  Temp(Src) 98.2 F (36.8 C) (Oral)  Resp 25  SpO2 100%  LMP 01/24/2014 Physical Exam  Constitutional: She is oriented to person, place, and time. She appears well-developed and well-nourished. No distress.  HENT:  Head: Normocephalic and atraumatic.  Mouth/Throat: Oropharynx is clear and moist. No oropharyngeal exudate.  Eyes: Right eye exhibits no discharge. Left eye exhibits no discharge. No scleral icterus.  Neck: Normal range of motion.  Cardiovascular: Normal rate, regular rhythm and normal heart sounds.   No murmur heard. Pulmonary/Chest: Accessory muscle usage present. Tachypnea noted. She is in respiratory distress. She has decreased breath sounds in the right upper field, the right middle field, the right lower field, the left upper field, the left middle field and the left lower field.  Mild to moderate respiratory distress. Patient still able to speak in full sentences.  Abdominal: Soft. There is no tenderness.  Musculoskeletal: Normal range of motion. She exhibits no edema or tenderness.  Neurological: She is alert and oriented to person, place, and time. No cranial nerve deficit. Coordination normal.  Skin: Skin is warm and dry. No rash noted. She is not diaphoretic.  Psychiatric: She has a normal mood and affect.  Nursing note and vitals reviewed.   ED Course  Procedures (including critical care time) Labs Review Labs Reviewed  CBC WITH DIFFERENTIAL/PLATELET - Abnormal; Notable for the following:    WBC 15.2 (*)    RBC 3.75 (*)    Hemoglobin 11.3 (*)    HCT 33.2 (*)    Neutrophils Relative % 89 (*)    Neutro Abs 13.6 (*)     Lymphocytes Relative 10 (*)    Monocytes Relative 1 (*)    All other components within normal limits  BASIC METABOLIC PANEL - Abnormal; Notable for the following:    Potassium 2.7 (*)    CO2 19 (*)    Glucose, Bld 142 (*)    Calcium 8.6 (*)    All other components within normal limits  URINALYSIS, ROUTINE W REFLEX MICROSCOPIC (NOT AT [facility]) - Abnormal; Notable for the following:    Specific Gravity, Urine 1.001 (*)    Leukocytes, UA SMALL (*)    All other components within normal limits  URINE MICROSCOPIC-ADD ON    Imaging Review Dg Chest 2 View  07/04/2014   CLINICAL DATA:  Shortness of breath for 4 days, worsening. Ex-smoker. History of asthma and heart murmur. Patient was seen here Tuesday for the same asthma attack. Attack has been going on since before Tuesday and has not improved. Wheezing in the right lung.  EXAM: CHEST  2 VIEW  COMPARISON:  04/07/2014  FINDINGS: The heart size and mediastinal contours are within normal limits. Both lungs are clear. The visualized skeletal structures are unremarkable.  IMPRESSION: No active cardiopulmonary disease.   Electronically Signed   By: Lucienne Capers M.D.   On: 07/04/2014 00:28     EKG Interpretation   Date/Time:  Saturday July 03 2014 23:51:48 EDT Ventricular Rate:  78 PR Interval:  119 QRS Duration: 95 QT Interval:  498 QTC Calculation: 567 R Axis:   28 Text Interpretation:  Sinus rhythm Borderline short PR interval Probable  left ventricular hypertrophy Prolonged QT interval No significant change  since last tracing Confirmed by KNAPP  MD-J, JON ([id number]) on 07/04/2014  12:05:22 AM      MDM   Final diagnoses:  SOB (shortness of breath)  Asthma exacerbation    Patient having mild improvement after 2 nebs. Wheezes noted throughout lung fields, prolonged still diminished bilaterally. Patient tachypneic, non-hypoxic. Patient placed on continuous albuterol treatment.  After continuous albuterol treatment, patient showing  no improvement. Continues to have bilateral wheezes diffusely. Patient continues to be tachypneic and using mild accessory muscle usage. Patient able to speak in full sentences. Patient continues to be non-hypoxic. Mild tachycardia thought to be due to albuterol use. Given patient's history of [redacted] weeks gestation, OB nurse was contacted, patient placed on fetal monitor, noted to be within normal limits.  Patient's labs unremarkable for acute pathology. Patient noted to be hypokalemic at 2.7, this was repleted orally with 40 mEq. Chest x-ray without evidence of acute pathology. EKG with out evidence of injury or ectopy. Considered PE, however given patient's extensive history of asthma, ICU admissions in the past for asthma exacerbation, and physical exam consistent with diminished lung sounds as well as diffuse, significant wheezes, there is low suspicion for PE at this time. Also considering patient's extensive history, and in significant improvement here, we'll admit patient to medicine for asthma exacerbation. Spoke with Dr. Blaine Hamper with [facility] to admit. The patient appears reasonably stabilized for admission considering the current resources, flow, and capabilities available in the ED at this time, and I doubt any other [facility] requiring further screening and/or treatment in the ED prior to admission.  BP 130/61 mmHg  Pulse 95  Temp(Src) 98.2 F (36.8 C) (Oral)  Resp 25  SpO2 100%  LMP 01/24/2014  Signed,  Dahlia Bailiff, PA-C 1:48 AM  Patient discussed with Dr Dorie Rank, MD   Dahlia Bailiff, PA-C 07/04/14 0148  Dorie Rank, MD 07/05/14 [phone]

## 2014-07-03 NOTE — ED Notes (Signed)
OB Nurse at bedside.

## 2014-07-03 NOTE — ED Notes (Signed)
Fetal heart tones heard, but not for long enough to count rate due to increased fetal activity.

## 2014-07-03 NOTE — ED Notes (Signed)
Respiratory called for continuous neb. Spoke to Quillian Quince he reports he will be to the department shortly.

## 2014-07-03 NOTE — ED Notes (Signed)
OB nurse called and is in route to assess patient because she is [redacted] weeks pregnant.

## 2014-07-03 NOTE — ED Notes (Signed)
Respiratory at pt bedside

## 2014-07-04 ENCOUNTER — Other Ambulatory Visit: Payer: Self-pay

## 2014-07-04 ENCOUNTER — Encounter (HOSPITAL_COMMUNITY): Payer: Self-pay | Admitting: Internal Medicine

## 2014-07-04 DIAGNOSIS — R0602 Shortness of breath: Secondary | ICD-10-CM | POA: Diagnosis not present

## 2014-07-04 DIAGNOSIS — J45901 Unspecified asthma with (acute) exacerbation: Secondary | ICD-10-CM | POA: Diagnosis present

## 2014-07-04 DIAGNOSIS — Z79899 Other long term (current) drug therapy: Secondary | ICD-10-CM | POA: Diagnosis not present

## 2014-07-04 DIAGNOSIS — E876 Hypokalemia: Secondary | ICD-10-CM | POA: Diagnosis present

## 2014-07-04 DIAGNOSIS — K219 Gastro-esophageal reflux disease without esophagitis: Secondary | ICD-10-CM | POA: Diagnosis present

## 2014-07-04 DIAGNOSIS — Z833 Family history of diabetes mellitus: Secondary | ICD-10-CM | POA: Diagnosis not present

## 2014-07-04 DIAGNOSIS — O99350 Diseases of the nervous system complicating pregnancy, unspecified trimester: Secondary | ICD-10-CM | POA: Diagnosis present

## 2014-07-04 DIAGNOSIS — O99512 Diseases of the respiratory system complicating pregnancy, second trimester: Secondary | ICD-10-CM | POA: Diagnosis present

## 2014-07-04 DIAGNOSIS — D573 Sickle-cell trait: Secondary | ICD-10-CM | POA: Diagnosis present

## 2014-07-04 DIAGNOSIS — Z91018 Allergy to other foods: Secondary | ICD-10-CM

## 2014-07-04 DIAGNOSIS — Z8249 Family history of ischemic heart disease and other diseases of the circulatory system: Secondary | ICD-10-CM | POA: Diagnosis not present

## 2014-07-04 DIAGNOSIS — Z825 Family history of asthma and other chronic lower respiratory diseases: Secondary | ICD-10-CM | POA: Diagnosis not present

## 2014-07-04 DIAGNOSIS — D72829 Elevated white blood cell count, unspecified: Secondary | ICD-10-CM | POA: Diagnosis present

## 2014-07-04 DIAGNOSIS — E873 Alkalosis: Secondary | ICD-10-CM | POA: Diagnosis present

## 2014-07-04 DIAGNOSIS — G40909 Epilepsy, unspecified, not intractable, without status epilepticus: Secondary | ICD-10-CM | POA: Diagnosis present

## 2014-07-04 DIAGNOSIS — Z3A21 21 weeks gestation of pregnancy: Secondary | ICD-10-CM | POA: Diagnosis present

## 2014-07-04 DIAGNOSIS — Z832 Family history of diseases of the blood and blood-forming organs and certain disorders involving the immune mechanism: Secondary | ICD-10-CM | POA: Diagnosis not present

## 2014-07-04 DIAGNOSIS — Z91040 Latex allergy status: Secondary | ICD-10-CM | POA: Diagnosis not present

## 2014-07-04 DIAGNOSIS — O9989 Other specified diseases and conditions complicating pregnancy, childbirth and the puerperium: Secondary | ICD-10-CM

## 2014-07-04 DIAGNOSIS — J45909 Unspecified asthma, uncomplicated: Secondary | ICD-10-CM

## 2014-07-04 DIAGNOSIS — Z87891 Personal history of nicotine dependence: Secondary | ICD-10-CM | POA: Diagnosis not present

## 2014-07-04 LAB — BASIC METABOLIC PANEL
Anion gap: 5 (ref 5–15)
Anion gap: 15 (ref 5–15)
BUN: 6 mg/dL (ref 6–20)
BUN: <5 mg/dL — ABNORMAL LOW (ref 6–20)
CO2: 12 mmol/L — ABNORMAL LOW (ref 22–32)
CO2: 19 mmol/L — AB (ref 22–32)
Calcium: 8.6 mg/dL — ABNORMAL LOW (ref 8.9–10.3)
Calcium: 9 mg/dL (ref 8.9–10.3)
Chloride: 111 mmol/L (ref 101–111)
Chloride: 108 mmol/L (ref 101–111)
Creatinine, Ser: 0.46 mg/dL (ref 0.44–1.00)
Creatinine, Ser: 0.59 mg/dL (ref 0.44–1.00)
GFR calc Af Amer: >60 mL/min (ref >60)
GFR calc Af Amer: >60 mL/min (ref >60)
GFR calc non Af Amer: >60 mL/min (ref >60)
GFR calc non Af Amer: >60 mL/min (ref >60)
GLUCOSE: 142 mg/dL — AB (ref 65–99)
Glucose, Bld: 154 mg/dL — ABNORMAL HIGH (ref 65–99)
POTASSIUM: 2.7 mmol/L — AB (ref 3.5–5.1)
Potassium: 4.1 mmol/L (ref 3.5–5.1)
SODIUM: 135 mmol/L (ref 135–145)
Sodium: 135 mmol/L (ref 135–145)

## 2014-07-04 LAB — URINALYSIS, ROUTINE W REFLEX MICROSCOPIC
BILIRUBIN URINE: NEGATIVE
GLUCOSE, UA: NEGATIVE mg/dL
HGB URINE DIPSTICK: NEGATIVE
Ketones, ur: NEGATIVE mg/dL
NITRITE: NEGATIVE
PROTEIN: NEGATIVE mg/dL
SPECIFIC GRAVITY, URINE: 1.001 — AB (ref 1.005–1.030)
Urobilinogen, UA: 0.2 mg/dL (ref 0.0–1.0)
pH: 6.5 (ref 5.0–8.0)

## 2014-07-04 LAB — CBC WITH DIFFERENTIAL/PLATELET
BASOS PCT: 0 % (ref 0–1)
Basophils Absolute: 0 10*3/uL (ref 0.0–0.1)
EOS PCT: 0 % (ref 0–5)
Eosinophils Absolute: 0 10*3/uL (ref 0.0–0.7)
HCT: 33.2 % — ABNORMAL LOW (ref 36.0–46.0)
Hemoglobin: 11.3 g/dL — ABNORMAL LOW (ref 12.0–15.0)
Lymphocytes Relative: 10 % — ABNORMAL LOW (ref 12–46)
Lymphs Abs: 1.5 10*3/uL (ref 0.7–4.0)
MCH: 30.1 pg (ref 26.0–34.0)
MCHC: 34 g/dL (ref 30.0–36.0)
MCV: 88.5 fL (ref 78.0–100.0)
MONO ABS: 0.1 10*3/uL (ref 0.1–1.0)
Monocytes Relative: 1 % — ABNORMAL LOW (ref 3–12)
Neutro Abs: 13.6 10*3/uL — ABNORMAL HIGH (ref 1.7–7.7)
Neutrophils Relative %: 89 % — ABNORMAL HIGH (ref 43–77)
PLATELETS: 195 10*3/uL (ref 150–400)
RBC: 3.75 MIL/uL — AB (ref 3.87–5.11)
RDW: 13.5 % (ref 11.5–15.5)
WBC: 15.2 10*3/uL — AB (ref 4.0–10.5)

## 2014-07-04 LAB — URINE MICROSCOPIC-ADD ON

## 2014-07-04 LAB — BLOOD GAS, ARTERIAL
Acid-base deficit: 7.9 mmol/L — ABNORMAL HIGH (ref 0.0–2.0)
Bicarbonate: 15.6 mEq/L — ABNORMAL LOW (ref 20.0–24.0)
DRAWN BY: 308601
O2 CONTENT: 2 L/min
O2 Saturation: 98.4 %
PATIENT TEMPERATURE: 98.6
PCO2 ART: 26.9 mmHg — AB (ref 35.0–45.0)
PO2 ART: 140 mmHg — AB (ref 80.0–100.0)
TCO2: 14.3 mmol/L (ref 0–100)
pH, Arterial: 7.38 (ref 7.350–7.450)

## 2014-07-04 LAB — PROTIME-INR
INR: 1.03 (ref 0.00–1.49)
PROTHROMBIN TIME: 13.7 s (ref 11.6–15.2)

## 2014-07-04 LAB — MAGNESIUM: Magnesium: 1.6 mg/dL — ABNORMAL LOW (ref 1.7–2.4)

## 2014-07-04 LAB — STREP PNEUMONIAE URINARY ANTIGEN: Strep Pneumo Urinary Antigen: NEGATIVE

## 2014-07-04 LAB — MRSA PCR SCREENING: MRSA by PCR: NEGATIVE

## 2014-07-04 MED ORDER — ALBUTEROL SULFATE (2.5 MG/3ML) 0.083% IN NEBU
2.5000 mg | INHALATION_SOLUTION | RESPIRATORY_TRACT | Status: DC | PRN
  Administered 2014-07-04 (×2): 2.5 mg via RESPIRATORY_TRACT
  Filled 2014-07-04 (×2): qty 3

## 2014-07-04 MED ORDER — BUDESONIDE 0.25 MG/2ML IN SUSP
0.2500 mg | Freq: Two times a day (BID) | RESPIRATORY_TRACT | Status: DC
  Administered 2014-07-04 – 2014-07-05 (×3): 0.25 mg via RESPIRATORY_TRACT
  Filled 2014-07-04 (×3): qty 2

## 2014-07-04 MED ORDER — MAGNESIUM HYDROXIDE 400 MG/5ML PO SUSP
30.0000 mL | Freq: Once | ORAL | Status: AC
  Administered 2014-07-04: 30 mL via ORAL
  Filled 2014-07-04: qty 30

## 2014-07-04 MED ORDER — DM-GUAIFENESIN ER 30-600 MG PO TB12
1.0000 | ORAL_TABLET | Freq: Two times a day (BID) | ORAL | Status: DC
  Administered 2014-07-04 – 2014-07-06 (×6): 1 via ORAL
  Filled 2014-07-04 (×10): qty 1

## 2014-07-04 MED ORDER — POTASSIUM CHLORIDE CRYS ER 20 MEQ PO TBCR: 40.0000 meq | EXTENDED_RELEASE_TABLET | Freq: Once | ORAL | Status: DC

## 2014-07-04 MED ORDER — POTASSIUM CHLORIDE 20 MEQ/15ML (10%) PO SOLN
40.0000 meq | Freq: Once | ORAL | Status: AC
  Administered 2014-07-04: 40 meq via ORAL
  Filled 2014-07-04: qty 30

## 2014-07-04 MED ORDER — DIPHENHYDRAMINE HCL 50 MG PO CAPS
ORAL_CAPSULE | ORAL | Status: AC
  Filled 2014-07-04: qty 1

## 2014-07-04 MED ORDER — EPINEPHRINE HCL 1 MG/ML IJ SOLN
1.0000 mg | Freq: Once | INTRAMUSCULAR | Status: DC
Start: 2014-07-04 — End: 2014-07-04

## 2014-07-04 MED ORDER — PRENATAL VITAMINS 0.8 MG PO TABS: 1.0000 | ORAL_TABLET | Freq: Every day | ORAL | Status: DC

## 2014-07-04 MED ORDER — LEVETIRACETAM 500 MG PO TABS
500.0000 mg | ORAL_TABLET | Freq: Two times a day (BID) | ORAL | Status: DC
  Administered 2014-07-04 – 2014-07-06 (×5): 500 mg via ORAL
  Filled 2014-07-04 (×9): qty 1

## 2014-07-04 MED ORDER — POTASSIUM CHLORIDE 20 MEQ/15ML (10%) PO SOLN: 40.0000 meq | Freq: Every day | ORAL | Status: DC

## 2014-07-04 MED ORDER — DIPHENHYDRAMINE HCL 50 MG PO CAPS
50.0000 mg | ORAL_CAPSULE | Freq: Once | ORAL | Status: AC
  Administered 2014-07-04: 50 mg via ORAL

## 2014-07-04 MED ORDER — FAMOTIDINE 40 MG PO TABS
40.0000 mg | ORAL_TABLET | Freq: Every evening | ORAL | Status: DC
  Administered 2014-07-04 – 2014-07-05 (×2): 40 mg via ORAL
  Filled 2014-07-04 (×2): qty 1
  Filled 2014-07-04: qty 2

## 2014-07-04 MED ORDER — IPRATROPIUM-ALBUTEROL 0.5-2.5 (3) MG/3ML IN SOLN
3.0000 mL | Freq: Four times a day (QID) | RESPIRATORY_TRACT | Status: DC
  Administered 2014-07-04 – 2014-07-05 (×4): 3 mL via RESPIRATORY_TRACT
  Filled 2014-07-04 (×4): qty 3

## 2014-07-04 MED ORDER — LORATADINE 10 MG PO TABS
10.0000 mg | ORAL_TABLET | Freq: Every day | ORAL | Status: DC
  Administered 2014-07-04 – 2014-07-06 (×3): 10 mg via ORAL
  Filled 2014-07-04 (×3): qty 1

## 2014-07-04 MED ORDER — POTASSIUM CHLORIDE 10 MEQ/100ML IV SOLN
10.0000 meq | INTRAVENOUS | Status: AC
  Administered 2014-07-04 (×2): 10 meq via INTRAVENOUS
  Filled 2014-07-04 (×2): qty 100

## 2014-07-04 MED ORDER — IPRATROPIUM-ALBUTEROL 0.5-2.5 (3) MG/3ML IN SOLN
3.0000 mL | RESPIRATORY_TRACT | Status: DC
  Administered 2014-07-04 (×2): 3 mL via RESPIRATORY_TRACT
  Filled 2014-07-04 (×2): qty 3

## 2014-07-04 MED ORDER — MAGNESIUM SULFATE 2 GM/50ML IV SOLN
2.0000 g | Freq: Once | INTRAVENOUS | Status: AC
  Administered 2014-07-04: 2 g via INTRAVENOUS
  Filled 2014-07-04: qty 50

## 2014-07-04 MED ORDER — DIPHENHYDRAMINE HCL 25 MG PO CAPS
25.0000 mg | ORAL_CAPSULE | ORAL | Status: DC | PRN
  Administered 2014-07-05 (×4): 25 mg via ORAL
  Filled 2014-07-04 (×4): qty 1

## 2014-07-04 MED ORDER — PRENATAL MULTIVITAMIN CH
1.0000 | ORAL_TABLET | Freq: Every day | ORAL | Status: DC
  Administered 2014-07-04 – 2014-07-05 (×2): 1 via ORAL
  Filled 2014-07-04 (×3): qty 1

## 2014-07-04 MED ORDER — HEPARIN SODIUM (PORCINE) 5000 UNIT/ML IJ SOLN
5000.0000 [IU] | Freq: Three times a day (TID) | INTRAMUSCULAR | Status: DC
  Administered 2014-07-04 – 2014-07-05 (×3): 5000 [IU] via SUBCUTANEOUS
  Filled 2014-07-04 (×3): qty 1

## 2014-07-04 MED ORDER — IPRATROPIUM-ALBUTEROL 0.5-2.5 (3) MG/3ML IN SOLN: 3.0000 mL | RESPIRATORY_TRACT | Status: DC

## 2014-07-04 MED ORDER — SODIUM CHLORIDE 0.9 % IV SOLN
INTRAVENOUS | Status: DC
  Administered 2014-07-04 (×2): via INTRAVENOUS

## 2014-07-04 MED ORDER — POTASSIUM CHLORIDE 10 MEQ/50ML IV SOLN: 10.0000 meq | INTRAVENOUS | Status: DC

## 2014-07-04 MED ORDER — METHYLPREDNISOLONE SODIUM SUCC 125 MG IJ SOLR
60.0000 mg | Freq: Three times a day (TID) | INTRAMUSCULAR | Status: DC
  Administered 2014-07-04 – 2014-07-05 (×4): 60 mg via INTRAVENOUS
  Filled 2014-07-04 (×4): qty 2

## 2014-07-04 MED ORDER — MONTELUKAST SODIUM 10 MG PO TABS
10.0000 mg | ORAL_TABLET | Freq: Every day | ORAL | Status: DC
  Administered 2014-07-04 – 2014-07-05 (×2): 10 mg via ORAL
  Filled 2014-07-04 (×3): qty 1

## 2014-07-04 MED ORDER — IPRATROPIUM-ALBUTEROL 0.5-2.5 (3) MG/3ML IN SOLN
3.0000 mL | Freq: Four times a day (QID) | RESPIRATORY_TRACT | Status: DC
  Administered 2014-07-04: 3 mL via RESPIRATORY_TRACT
  Filled 2014-07-04: qty 3

## 2014-07-04 NOTE — H&P (Signed)
[facility] History and Physical  Jasmine [name] OEU:[id number] DOB: 1990-05-10 DOA: 07/03/2014  Referring physician: ED physician PCP: No PCP Per Patient  Specialists:   Chief Complaint: SOB and dry cough  HPI: Jasmine [name] is a 24 y.o. female with PMH of asthma, history of intubation due to asthma exacerbation, seizure, sickle cell trait, pancreatitis, GERD, who presents with a dry cough and shortness of breath.  Patient reports that she has shortness breath in the past 2 weeks. Patient states she was here on 06/23/14, given nebulizer treatment and prednisone. Patient states that prednisone gives her hives, she was unable to take it. She states since leaving her symptoms have progressively worsened again. Patient reports using her inhaler at home multiple times through the day, unable to count the amount she is been using it. She has a severe chest tightness, mild chest muscle pain due to coughing. She has dry cough, no fever or chills. Of, note, patient is currently [redacted] weeks pregnant. She denies any vaginal bleeding or abdominal cramping. Denies contractions or vaginal discharge. No abdominal pain, diarrhea, symptoms of UTI and no unilateral weakness.  In ED, patient was found to have tachycardia, normal temperature, WBC 15.2, potassium 2.7, negative chest x-ray for acute abnormalities. ABG showed pH 7.38, PCO2 26.9 PO2 140. Urinalysis with trace amount of leukocytes. Patient is admitted to inpatient for further evaluation and treatment.  Where does patient live?   At home   Can patient participate in ADLs?  Yes    Review of Systems:   General: no fevers, chills, no changes in body weight, has poor appetite, has fatigue HEENT: no blurry vision, hearing changes or sore throat Pulm: has dyspnea, coughing, wheezing CV: has chest pain, no palpitations Abd: no nausea, vomiting, abdominal pain, diarrhea, constipation GU: no dysuria, burning on urination, increased urinary frequency,  hematuria  Ext: no leg edema Neuro: no unilateral weakness, numbness, or tingling, no vision change or hearing loss Skin: no rash MSK: No muscle spasm, no deformity, no limitation of range of movement in spin Heme: No easy bruising.  Travel history: No recent long distant travel.  Allergy:  Allergies  Allergen Reactions  . Avocado Anaphylaxis  . Cheese Shortness Of Breath    Asthma trigger  . Chocolate Shortness Of Breath    Asthma trigger  . Ibuprofen Hives and Shortness Of Breath    Tolerates naproxen  . Orange Juice [Orange Oil] Shortness Of Breath  . Other Hives    ALLERGIC TO ALL STEROIDS       EXCEPT IV SOLU MEDROL  . Peach [Prunus Persica] Anaphylaxis  . Peanuts [Peanut Oil] Anaphylaxis  . Pear Anaphylaxis  . Prednisone Hives  . Raspberry Anaphylaxis  . Tylenol [Acetaminophen] Hives and Shortness Of Breath  . Amoxicillin Hives  . Doxycycline Hives  . Erythromycin Hives  . Ivp Dye [Iodinated Diagnostic Agents]     Shortness of breath   . Latex     Swelling/anaphylaxis  . Penicillins Hives    Past Medical History  Diagnosis Date  . Asthma   . Seizures   . Sickle cell trait   . Pancreatitis   . Heart murmur   . GERD (gastroesophageal reflux disease)     Past Surgical History  Procedure Laterality Date  . Nerve, tendon and artery repair Right 09/23/2012    Procedure: I&D and Repair As Necessary/Right Hand and Palm;  Surgeon: Roseanne Kaufman, MD;  Location: [place];  Service: Orthopedics;  Laterality: Right;  . Tonsillectomy    .  Hernia repair    . Cesarean section    . Hand surgery    . Appendectomy      Social History:  reports that she has quit smoking. Her smoking use included Cigarettes. She has never used smokeless tobacco. She reports that she does not drink alcohol or use illicit drugs.  Family History:  Family History  Problem Relation Age of Onset  . Cancer Mother     cervical cancer  . Asthma Mother   . Hypertension Father   . Sickle cell  anemia Father   . Asthma Sister   . Diabetes Maternal Aunt   . Cancer Maternal Grandmother      Prior to Admission medications   Medication Sig Start Date End Date Taking? Authorizing Provider  albuterol (PROVENTIL HFA;VENTOLIN HFA) 108 (90 BASE) MCG/ACT inhaler Inhale 1-2 puffs into the lungs every 6 (six) hours as needed for wheezing or shortness of breath. 06/23/14  Yes Lacretia Leigh, MD  EPINEPHrine (ADRENALIN) 1 MG/ML injection Inject 1 mL (1 mg total) into the muscle once. 06/10/14  Yes Nila Nephew, MD  levETIRAcetam (KEPPRA) 500 MG tablet Take 1 tablet (500 mg total) by mouth 2 (two) times daily. 04/09/14  Yes Barton Dubois, MD  Prenatal Multivit-Min-Fe-FA (PRENATAL VITAMINS) 0.8 MG tablet Take 1 tablet by mouth daily. 06/10/14  Yes Nila Nephew, MD  famotidine (PEPCID) 40 MG tablet Take 1 tablet (40 mg total) by mouth every evening. Patient not taking: Reported on 06/23/2014 06/10/14 06/10/15  Woodroe Mode, MD  Fluticasone-Salmeterol (ADVAIR DISKUS) 250-50 MCG/DOSE AEPB Inhale 1 puff into the lungs 2 (two) times daily. Patient not taking: Reported on 06/10/2014 04/09/14   Barton Dubois, MD  promethazine (PHENERGAN) 25 MG tablet Take 1 tablet (25 mg total) by mouth every 6 (six) hours as needed for nausea or vomiting. Patient not taking: Reported on 07/03/2014 06/10/14   Woodroe Mode, MD  ranitidine (ZANTAC) 150 MG tablet Take 1 tablet (150 mg total) by mouth 2 (two) times daily. Patient not taking: Reported on 06/10/2014 03/24/14   Elvera Maria, CNM    Physical Exam: Filed Vitals:   07/03/14 2236 07/03/14 2257 07/03/14 2355 07/04/14 0100  BP: 111/53  120/55 130/61  Pulse: 79  107 95  Temp:      TempSrc:      Resp: 18  23 25   SpO2: 99% 99% 98% 100%   General: Not in acute distress HEENT:       Eyes: PERRL, EOMI, no scleral icterus.       ENT: No discharge from the ears and nose, no pharynx injection, no tonsillar enlargement.        Neck: No JVD, no bruit, no mass  felt. Heme: No neck lymph node enlargement. Cardiac: S1/S2, RRR, No murmurs, No gallops or rubs. Pulm: Severely decreased air movement bilaterally, minimal wheezing heard bilaterally. No rales or rubs. Abd: Soft, nondistended, nontender, no rebound pain, no organomegaly, BS present. Ext: No pitting leg edema bilaterally. 2+DP/PT pulse bilaterally. Musculoskeletal: No joint deformities, No joint redness or warmth, no limitation of ROM in spin. Skin: No rashes.  Neuro: Alert, oriented X3, cranial nerves II-XII grossly intact, muscle strength 5/5 in all extremities, sensation to light touch intact.  Psych: Patient is not psychotic, no suicidal or hemocidal ideation.  Labs on Admission:  Basic Metabolic Panel:  Recent Labs Lab 07/04/14 0021  NA 135  K 2.7*  CL 111  CO2 19*  GLUCOSE 142*  BUN 6  CREATININE  0.46  CALCIUM 8.6*   Liver Function Tests: No results for input(s): AST, ALT, ALKPHOS, BILITOT, PROT, ALBUMIN in the last 168 hours. No results for input(s): LIPASE, AMYLASE in the last 168 hours. No results for input(s): AMMONIA in the last 168 hours. CBC:  Recent Labs Lab 07/04/14 0021  WBC 15.2*  NEUTROABS 13.6*  HGB 11.3*  HCT 33.2*  MCV 88.5  PLT 195   Cardiac Enzymes: No results for input(s): CKTOTAL, CKMB, CKMBINDEX, TROPONINI in the last 168 hours.  BNP (last 3 results) No results for input(s): BNP in the last 8760 hours.  ProBNP (last 3 results) No results for input(s): PROBNP in the last 8760 hours.  CBG: No results for input(s): GLUCAP in the last 168 hours.  Radiological Exams on Admission: Dg Chest 2 View  07/04/2014   CLINICAL DATA:  Shortness of breath for 4 days, worsening. Ex-smoker. History of asthma and heart murmur. Patient was seen here Tuesday for the same asthma attack. Attack has been going on since before Tuesday and has not improved. Wheezing in the right lung.  EXAM: CHEST  2 VIEW  COMPARISON:  04/07/2014  FINDINGS: The heart size and  mediastinal contours are within normal limits. Both lungs are clear. The visualized skeletal structures are unremarkable.  IMPRESSION: No active cardiopulmonary disease.   Electronically Signed   By: Lucienne Capers M.D.   On: 07/04/2014 00:28    EKG: Independently reviewed.  Abnormal findings: QTc interval 567, mild T-wave inversion in lead 3, aVf, V2-V3.   Assessment/Plan Principal Problem:   Asthma exacerbation Active Problems:   Seizure disorder, sept 2015 last seizure   Asthma complicating pregnancy, antepartum   History of food anaphylaxis   GERD (gastroesophageal reflux disease)  Asthma exacerbation: Patient has severely decreased air movement bilaterally. ABG showed pH 7.38, PCO2 26.9 PO2 140, respiratory alkalosis. Complicated by 21 week pregnancy.  -will admit patient to SDU -Nebulizers: scheduled Duoneb q2h and continuous albuterol Neb -Solu-Medrol 60 mg IV q8h -Mucinex for cough  -Urine legionella and S. pneumococcal antigen -follow up sputum culture, respiratory virus panel -Monitor her on telemetry.  21 week pregnancy: No alarming symptoms or signs for abortion - Continue fetal heart rate monitoring every shift. - Prenatal vitamins.  Hypokalemia: K= 2.7 on admission. - Repleted with oral 40 mEq and IV 20 mEq of KCl - Check Mg level  History of seizure disorder: Patient is on Keppra since she has been taking it regularly - Will continue Keppra -Check Keppra level  GERD: -Pepcide    DVT ppx: SCD  Code Status: Full code Family Communication: None at bed side.  Disposition Plan: Admit to inpatient   Date of Service 07/04/2014    Ivor Costa [facility] Pager [phone]  If 7PM-7AM, please contact night-coverage [email or web address] Password [email or web address] 07/04/2014, 1:40 AM

## 2014-07-04 NOTE — Progress Notes (Signed)
Patient is A/Ox4 and is ambulatory with 1 person standby assist. She had c/o SOB during the shift and prn breathing treatment was given and was effective. IV in RAC was d/c due to patient c/o pain, no infiltration or redness noted. IV team consulted for IV restart. Pt.had c/o constipation. Reported that lbm was one week ago. On call Mid-level provider with [facility] was paged and notified. New order was written for a milk of magnesia. After pt.received medication and she had c/o itching and redness. Mid-level paged again and notified and new orders were written. Pt.recived prn medication for itching. Pharmacy was called and notified to placed allergy to milk on magnesia on patient's chart.

## 2014-07-04 NOTE — Consult Note (Signed)
PULMONARY / CRITICAL CARE MEDICINE   Name: Jasmine [name] MRN: [id number] DOB: [birth year]    ADMISSION DATE:  07/03/2014 CONSULTATION DATE:  07/04/14  REFERRING MD :  Sloan Leiter  CHIEF COMPLAINT:  Asthma, dyspnea  INITIAL PRESENTATION: 24 yo woman, [redacted] wks pregnant, hx asthma, [place] trait, seizure disorder, allergies, GERD. Admitted with asthma exacerbation 07/04/14. Moved to SDU with refractory wheeze. PCCM consulted  STUDIES:  CXR 6/19 (reviewed personally) >> no infiltrates  SIGNIFICANT EVENTS: 6/8 seen in ED with wheeze, acute AE asthma, treated w nebulized BD's, started prednisone but unable to take  6/18-19 admitted with refractory sx, wheezing   HISTORY OF PRESENT ILLNESS:  24 yo woman with hx asthma,  trait, seizure disorder, allergies (including peanuts, other foods), GERD. Pregnant at [redacted] wks gestation. She was seen in ED 6/8 with wheeze and an apparent early exacerbation of her asthma. Was given prednisone to take but was unable to tolerate it - says it causes rash. She had progressive dyspnea and chest tightness that did not respond well to increasing SABA use. Was admitted 6/19 early am. States that her asthma is more labile during the Spring allergy sx. She is on Advair, albuterol prn at home and is compliant with these. Carries a hx of GERD but denies any GERD sx leading up to this flare. No URI sx or sick contacts. Her only med change has been from carbamazepine to Keppra at the beginning of her pregnancy.   PAST MEDICAL HISTORY :   has a past medical history of Asthma; Seizures; Sickle cell trait; Pancreatitis; Heart murmur; and GERD (gastroesophageal reflux disease).  has past surgical history that includes Nerve, tendon and artery repair (Right, 09/23/2012); Tonsillectomy; Hernia repair; Cesarean section; Hand surgery; and Appendectomy. Prior to Admission medications   Medication Sig Start Date End Date Taking? Authorizing Provider  albuterol (PROVENTIL HFA;VENTOLIN HFA) 108 (90  BASE) MCG/ACT inhaler Inhale 1-2 puffs into the lungs every 6 (six) hours as needed for wheezing or shortness of breath. 06/23/14  Yes Lacretia Leigh, MD  EPINEPHrine (ADRENALIN) 1 MG/ML injection Inject 1 mL (1 mg total) into the muscle once. 06/10/14  Yes Nila Nephew, MD  levETIRAcetam (KEPPRA) 500 MG tablet Take 1 tablet (500 mg total) by mouth 2 (two) times daily. 04/09/14  Yes Barton Dubois, MD  Prenatal Multivit-Min-Fe-FA (PRENATAL VITAMINS) 0.8 MG tablet Take 1 tablet by mouth daily. 06/10/14  Yes Nila Nephew, MD  famotidine (PEPCID) 40 MG tablet Take 1 tablet (40 mg total) by mouth every evening. Patient not taking: Reported on 06/23/2014 06/10/14 06/10/15  Woodroe Mode, MD  Fluticasone-Salmeterol (ADVAIR DISKUS) 250-50 MCG/DOSE AEPB Inhale 1 puff into the lungs 2 (two) times daily. Patient not taking: Reported on 06/10/2014 04/09/14   Barton Dubois, MD  promethazine (PHENERGAN) 25 MG tablet Take 1 tablet (25 mg total) by mouth every 6 (six) hours as needed for nausea or vomiting. Patient not taking: Reported on 07/03/2014 06/10/14   Woodroe Mode, MD  ranitidine (ZANTAC) 150 MG tablet Take 1 tablet (150 mg total) by mouth 2 (two) times daily. Patient not taking: Reported on 06/10/2014 03/24/14   Kathie Dike Leftwich-Kirby, CNM   Allergies  Allergen Reactions  . Avocado Anaphylaxis  . Cheese Shortness Of Breath    Asthma trigger  . Chocolate Shortness Of Breath    Asthma trigger  . Ibuprofen Hives and Shortness Of Breath    Tolerates naproxen  . Orange Juice [Orange Oil] Shortness Of Breath  . Other Hives  ALLERGIC TO ALL STEROIDS       EXCEPT IV SOLU MEDROL  . Peach [Prunus Persica] Anaphylaxis  . Peanuts [Peanut Oil] Anaphylaxis  . Pear Anaphylaxis  . Prednisone Hives  . Raspberry Anaphylaxis  . Tylenol [Acetaminophen] Hives and Shortness Of Breath  . Amoxicillin Hives  . Doxycycline Hives  . Erythromycin Hives  . Ivp Dye [Iodinated Diagnostic Agents]     Shortness of breath   .  Latex     Swelling/anaphylaxis  . Penicillins Hives    FAMILY HISTORY:  indicated that her mother is alive. She indicated that her father is alive. She indicated that her sister is alive. She indicated that her maternal grandmother is deceased. She indicated that her maternal aunt is alive.  SOCIAL HISTORY:  reports that she has quit smoking. Her smoking use included Cigarettes. She has never used smokeless tobacco. She reports that she does not drink alcohol or use illicit drugs.  REVIEW OF SYSTEMS:  C/o chest tightness, anxiety, dyspnea. No throat tightness. Denies GERD sx. Minimal PND / congestion.   SUBJECTIVE:  Feels a bit better this am but continued chest tightness.   VITAL SIGNS: Temp:  [98.2 F (36.8 C)-98.4 F (36.9 C)] 98.2 F (36.8 C) (06/19 0751) Pulse Rate:  [75-107] 84 (06/19 0700) Resp:  [17-32] 22 (06/19 0700) BP: (100-139)/(45-63) 103/49 mmHg (06/19 0700) SpO2:  [97 %-100 %] 100 % (06/19 0741) Weight:  [75.3 kg (166 lb 0.1 oz)] 75.3 kg (166 lb 0.1 oz) (06/19 0332) HEMODYNAMICS:   VENTILATOR SETTINGS:   INTAKE / OUTPUT:  Intake/Output Summary (Last 24 hours) at 07/04/14 0953 Last data filed at 07/04/14 0818  Gross per 24 hour  Intake    785 ml  Output   1200 ml  Net   -415 ml    PHYSICAL EXAMINATION: General:  Tired, ill appearing woman, NAD Neuro:  Awake, globally weak but non-focal HEENT:  OP clear, no stridor, PERRL Cardiovascular:  Regular, 2/6 systolic M Lungs:  Distant, few soft insp wheezes, no wheeze on exp.  Abdomen:  Soft, NT, + BS Musculoskeletal:  No edema Skin:  No rash  LABS:  CBC  Recent Labs Lab 07/04/14 0021  WBC 15.2*  HGB 11.3*  HCT 33.2*  PLT 195   Coag's  Recent Labs Lab 07/04/14 0605  INR 1.03   BMET  Recent Labs Lab 07/04/14 0021 07/04/14 0530  NA 135 135  K 2.7* 4.1  CL 111 108  CO2 19* 12*  BUN 6 <5*  CREATININE 0.46 0.59  GLUCOSE 142* 154*   Electrolytes  Recent Labs Lab 07/04/14 0021  07/04/14 0530 07/04/14 0605  CALCIUM 8.6* 9.0  --   MG  --   --  1.6*   Sepsis Markers No results for input(s): LATICACIDVEN, PROCALCITON, O2SATVEN in the last 168 hours. ABG  Recent Labs Lab 07/04/14 0215  PHART 7.380  PCO2ART 26.9*  PO2ART 140*   Liver Enzymes No results for input(s): AST, ALT, ALKPHOS, BILITOT, ALBUMIN in the last 168 hours. Cardiac Enzymes No results for input(s): TROPONINI, PROBNP in the last 168 hours. Glucose No results for input(s): GLUCAP in the last 168 hours.  Imaging Dg Chest 2 View  07/04/2014   CLINICAL DATA:  Shortness of breath for 4 days, worsening. Ex-smoker. History of asthma and heart murmur. Patient was seen here Tuesday for the same asthma attack. Attack has been going on since before Tuesday and has not improved. Wheezing in the right lung.  EXAM: CHEST  2 VIEW  COMPARISON:  04/07/2014  FINDINGS: The heart size and mediastinal contours are within normal limits. Both lungs are clear. The visualized skeletal structures are unremarkable.  IMPRESSION: No active cardiopulmonary disease.   Electronically Signed   By: Lucienne Capers M.D.   On: 07/04/2014 00:28     ASSESSMENT / PLAN:  PULMONARY A: Asthma with acute exacerbation Multiple severe allergies including peanuts, latex > see list Allergic rhinitis P:   - continue solumedrol as ordered - scheduled BD's - pulmicort nebs bid - add singulair qhs - add loratdine qd - careful to avoid allergens - continue GERD maintenance - doubt an acute infection based on her history, RVP has been sent and is pending  GASTROINTESTINAL A:  GERD P:   - continue home pepcid  - consider change to PPI if breakthrough sx  HEMATOLOGIC A:  Leukocytosis on steroids Hx Sickle cell trait P:  - follow   INFECTIOUS A:  No evidence active infection, CXR clear P:   BCx2  6/19 >>  UC 6/19 >>  RVP 6/19 >>   - follow clinically off abx  ENDOCRINE A:  Hyperglycemia on steroids   P:   - follow  CBG, add SSI   NEUROLOGIC A:  Seizure disorder, controlled now on Keppra P:   - continue Keppra, doubt the change from carbamazepine has caused her to flare based on the timing   FAMILY  - Updates: none at bedside 6/19  - Inter-disciplinary family meet or Palliative Care meeting due by:  07/11/14    Baltazar Apo, MD, PhD 07/04/2014, 10:21 AM [facility] [phone] or if no answer [phone]

## 2014-07-04 NOTE — Progress Notes (Signed)
PATIENT DETAILS Name: Jasmine [name] Age: 24 y.o. Sex: female Date of Birth: 1990-03-24 Admit Date: 07/03/2014 Admitting Physician Ivor Costa, MD PCP:No PCP Per Patient  Subjective: Still short of breath-claims she is "tight". Anxious and tachypneic.Denies any nasal congestion or sinus congestion  Assessment/Plan: Principal Problem: Asthma exacerbation:Not much improved that on admission-Continue IV Steroids, change nebs to every 4 hours and every 2 hours prn.Add Budesonide nebs, one dose of Mag Sulfate.Continue to monitor closely in SDU-have asked RT to give second round of prn Albuterol neb-if no improvement may need an hour long neb.Consult PCCM  Active Problems: Seizure disorder, sept 2015 last seizure:continue Keppra  GERD:continue Pepcid  [redacted] weeks pregnant:fetal heart monitoring every shift. Spoke with Dr Cyndia Bent on call-to continue current treatment. OB RN will continue to monitor fetal heart rate.   Disposition: Remain inpatient in SDU  Antimicrobial agents  See below  Anti-infectives    None      DVT Prophylaxis: Prophylactic Heparin   Code Status: Full code   Family Communication None at bedside  Procedures: None  CONSULTS:  pulmonary/intensive care  Time spent 40 minutes-Greater than 50% of this time was spent in counseling, explanation of diagnosis, planning of further management, and coordination of care.  MEDICATIONS: Scheduled Meds: . budesonide (PULMICORT) nebulizer solution  0.25 mg Nebulization BID  . dextromethorphan-guaiFENesin  1 tablet Oral BID  . famotidine  40 mg Oral QPM  . heparin subcutaneous  5,000 Units Subcutaneous 3 times per day  . ipratropium-albuterol  3 mL Nebulization Q4H  . levETIRAcetam  500 mg Oral BID  . magnesium sulfate 1 - 4 g bolus IVPB  2 g Intravenous Once  . methylPREDNISolone (SOLU-MEDROL) injection  60 mg Intravenous 3 times per day  . prenatal multivitamin  1 tablet Oral Q1200    Continuous Infusions: . sodium chloride    . albuterol Stopped (07/04/14 0004)   PRN Meds:.albuterol    PHYSICAL EXAM: Vital signs in last 24 hours: Filed Vitals:   07/04/14 0600 07/04/14 0700 07/04/14 0741 07/04/14 0751  BP:  103/49    Pulse: 93 84    Temp:    98.2 F (36.8 C)  TempSrc:    Oral  Resp: 17 22    Height:      Weight:      SpO2: 100% 100% 100%     Weight change:  Filed Weights   07/04/14 0332  Weight: 75.3 kg (166 lb 0.1 oz)   Body mass index is 29.41 kg/(m^2).   Gen Exam: Awake and alert with clear speech.  In mild distress. Neck: Supple, No JVD.   Chest: Moving air in the upper lobes only-+expiratory rhonchi in upper lobes only CVS: S1 S2 Regular, no murmurs.  Abdomen: soft, BS +, non tender, non distended.  Extremities: no edema, lower extremities warm to touch. Neurologic: Non Focal.   Skin: No Rash.   Wounds: N/A.    Intake/Output from previous day:  Intake/Output Summary (Last 24 hours) at 07/04/14 0809 Last data filed at 07/04/14 0745  Gross per 24 hour  Intake    420 ml  Output   1200 ml  Net   -780 ml     LAB RESULTS: CBC  Recent Labs Lab 07/04/14 0021  WBC 15.2*  HGB 11.3*  HCT 33.2*  PLT 195  MCV 88.5  MCH 30.1  MCHC 34.0  RDW 13.5  LYMPHSABS 1.5  MONOABS  0.1  EOSABS 0.0  BASOSABS 0.0    Chemistries   Recent Labs Lab 07/04/14 0021 07/04/14 0530 07/04/14 0605  NA 135 135  --   K 2.7* 4.1  --   CL 111 108  --   CO2 19* 12*  --   GLUCOSE 142* 154*  --   BUN 6 <5*  --   CREATININE 0.46 0.59  --   CALCIUM 8.6* 9.0  --   MG  --   --  1.6*    CBG: No results for input(s): GLUCAP in the last 168 hours.  GFR Estimated Creatinine Clearance: 106.4 mL/min (by C-G formula based on Cr of 0.59).  Coagulation profile  Recent Labs Lab 07/04/14 0605  INR 1.03    Cardiac Enzymes No results for input(s): CKMB, TROPONINI, MYOGLOBIN in the last 168 hours.  Invalid input(s): CK  Invalid input(s):  POCBNP No results for input(s): DDIMER in the last 72 hours. No results for input(s): HGBA1C in the last 72 hours. No results for input(s): CHOL, HDL, LDLCALC, TRIG, CHOLHDL, LDLDIRECT in the last 72 hours. No results for input(s): TSH, T4TOTAL, T3FREE, THYROIDAB in the last 72 hours.  Invalid input(s): FREET3 No results for input(s): VITAMINB12, FOLATE, FERRITIN, TIBC, IRON, RETICCTPCT in the last 72 hours. No results for input(s): LIPASE, AMYLASE in the last 72 hours.  Urine Studies No results for input(s): UHGB, CRYS in the last 72 hours.  Invalid input(s): UACOL, UAPR, USPG, UPH, UTP, UGL, UKET, UBIL, UNIT, UROB, ULEU, UEPI, UWBC, URBC, UBAC, CAST, UCOM, BILUA  MICROBIOLOGY: Recent Results (from the past 240 hour(s))  MRSA PCR Screening     Status: None   Collection Time: 07/04/14  3:42 AM  Result Value Ref Range Status   MRSA by PCR NEGATIVE NEGATIVE Final    Comment:        The GeneXpert MRSA Assay (FDA approved for NASAL specimens only), is one component of a comprehensive MRSA colonization surveillance program. It is not intended to diagnose MRSA infection nor to guide or monitor treatment for MRSA infections.     RADIOLOGY STUDIES/RESULTS: Dg Chest 2 View  07/04/2014   CLINICAL DATA:  Shortness of breath for 4 days, worsening. Ex-smoker. History of asthma and heart murmur. Patient was seen here Tuesday for the same asthma attack. Attack has been going on since before Tuesday and has not improved. Wheezing in the right lung.  EXAM: CHEST  2 VIEW  COMPARISON:  04/07/2014  FINDINGS: The heart size and mediastinal contours are within normal limits. Both lungs are clear. The visualized skeletal structures are unremarkable.  IMPRESSION: No active cardiopulmonary disease.   Electronically Signed   By: Lucienne Capers M.D.   On: 07/04/2014 00:28   US Ob Detail + 14 Wk  06/17/2014   OBSTETRICAL ULTRASOUND: This exam was performed within a [facility] Department. The  OB US report was generated in the AS system, and faxed to the ordering physician.   This report is available in the [facility]. See the AS Obstetric US report via the Image Link.   Oren Binet, MD  [facility] Pager:[phone]  If 7PM-7AM, please contact night-coverage [email or web address] Password [email or web address] 07/04/2014, 8:09 AM   LOS: 0 days

## 2014-07-04 NOTE — ED Notes (Signed)
Hospitalist at pt bedside.

## 2014-07-04 NOTE — ED Notes (Signed)
Report called to 2W. Spoke with charge nurse on duty.

## 2014-07-05 LAB — BASIC METABOLIC PANEL
Anion gap: 8 (ref 5–15)
BUN: 6 mg/dL (ref 6–20)
CO2: 22 mmol/L (ref 22–32)
CREATININE: 0.45 mg/dL (ref 0.44–1.00)
Calcium: 8.8 mg/dL — ABNORMAL LOW (ref 8.9–10.3)
Chloride: 108 mmol/L (ref 101–111)
GFR calc non Af Amer: >60 mL/min (ref >60)
GLUCOSE: 128 mg/dL — AB (ref 65–99)
Potassium: 3.7 mmol/L (ref 3.5–5.1)
Sodium: 138 mmol/L (ref 135–145)

## 2014-07-05 LAB — URINE CULTURE

## 2014-07-05 LAB — LEGIONELLA ANTIGEN, URINE

## 2014-07-05 LAB — CBC
HCT: 30.3 % — ABNORMAL LOW (ref 36.0–46.0)
HEMOGLOBIN: 10.2 g/dL — AB (ref 12.0–15.0)
MCH: 30.3 pg (ref 26.0–34.0)
MCHC: 33.7 g/dL (ref 30.0–36.0)
MCV: 89.9 fL (ref 78.0–100.0)
Platelets: 191 10*3/uL (ref 150–400)
RBC: 3.37 MIL/uL — ABNORMAL LOW (ref 3.87–5.11)
RDW: 13.8 % (ref 11.5–15.5)
WBC: 30.9 10*3/uL — ABNORMAL HIGH (ref 4.0–10.5)

## 2014-07-05 MED ORDER — METHYLPREDNISOLONE SODIUM SUCC 125 MG IJ SOLR
60.0000 mg | Freq: Two times a day (BID) | INTRAMUSCULAR | Status: AC
  Administered 2014-07-05: 60 mg via INTRAVENOUS
  Filled 2014-07-05: qty 0.96

## 2014-07-05 MED ORDER — TIOTROPIUM BROMIDE MONOHYDRATE 18 MCG IN CAPS
18.0000 ug | ORAL_CAPSULE | Freq: Every day | RESPIRATORY_TRACT | Status: DC
  Filled 2014-07-05 (×2): qty 5

## 2014-07-05 MED ORDER — PREDNISONE 20 MG PO TABS: 50.0000 mg | ORAL_TABLET | Freq: Every day | ORAL | Status: DC

## 2014-07-05 MED ORDER — METHYLPREDNISOLONE 32 MG PO TABS
48.0000 mg | ORAL_TABLET | Freq: Once | ORAL | Status: AC
  Administered 2014-07-06: 48 mg via ORAL
  Filled 2014-07-05: qty 1

## 2014-07-05 MED ORDER — ALBUTEROL SULFATE (2.5 MG/3ML) 0.083% IN NEBU: 2.5000 mg | INHALATION_SOLUTION | RESPIRATORY_TRACT | Status: DC | PRN

## 2014-07-05 MED ORDER — ARFORMOTEROL TARTRATE 15 MCG/2ML IN NEBU
15.0000 ug | INHALATION_SOLUTION | Freq: Two times a day (BID) | RESPIRATORY_TRACT | Status: DC
  Administered 2014-07-05: 15 ug via RESPIRATORY_TRACT
  Filled 2014-07-05 (×4): qty 2

## 2014-07-05 MED ORDER — PREDNISONE 50 MG PO TABS
60.0000 mg | ORAL_TABLET | Freq: Every day | ORAL | Status: DC
  Filled 2014-07-05: qty 1

## 2014-07-05 MED ORDER — BUDESONIDE 0.5 MG/2ML IN SUSP
0.5000 mg | Freq: Two times a day (BID) | RESPIRATORY_TRACT | Status: DC
  Administered 2014-07-05: 0.5 mg via RESPIRATORY_TRACT
  Filled 2014-07-05 (×2): qty 2

## 2014-07-05 MED ORDER — ENOXAPARIN SODIUM 40 MG/0.4ML ~~LOC~~ SOLN
40.0000 mg | SUBCUTANEOUS | Status: DC
Start: 2014-07-05 — End: 2014-07-06
  Administered 2014-07-05: 40 mg via SUBCUTANEOUS
  Filled 2014-07-05 (×2): qty 0.4

## 2014-07-05 MED ORDER — BUDESONIDE 0.5 MG/2ML IN SUSP: 0.5000 mg | Freq: Two times a day (BID) | RESPIRATORY_TRACT | Status: DC

## 2014-07-05 NOTE — Progress Notes (Signed)
07/05/2014 Notified rapid response nurse [place] concerning patient fatal monitoring that is being done daily [facility].

## 2014-07-05 NOTE — Progress Notes (Signed)
[facility] Rapid Response. Spoke with Jeani Hawking, RN to notify that patient has orders for daily fetal heart tone monitoring. [facility] Rapid response nurse is aware and said she would also notify the oncoming day shift rapid response RN.

## 2014-07-05 NOTE — Consult Note (Deleted)
PULMONARY / CRITICAL CARE MEDICINE   Name: Jasmine [name] MRN: [id number] DOB: Jun 13, 1990    ADMISSION DATE:  07/03/2014 CONSULTATION DATE:  07/04/14  REFERRING MD :  Sloan Leiter  CHIEF COMPLAINT:  Asthma, dyspnea  INITIAL PRESENTATION: 24 yo woman, [redacted] wks pregnant, hx asthma, [place] trait, seizure disorder, allergies, GERD. Admitted with asthma exacerbation 07/04/14. Moved to SDU with refractory wheeze. PCCM consulted  STUDIES:  CXR 6/19 (reviewed personally) >> no infiltrates  SIGNIFICANT EVENTS: 6/08  seen in ED with wheeze, acute AE asthma, treated w nebulized BD's, started prednisone but unable to take  6/19  admitted with refractory sx, wheezing 6/20  Off O2, no distress     SUBJECTIVE:  Feels better, denies cough / SOB.  Reports she has been in [place] for one year.  Previously had a home nebulizer machine but this was in [place] and it broke.    VITAL SIGNS: Temp:  [98.2 F (36.8 C)-99.3 F (37.4 C)] 98.5 F (36.9 C) (06/20 0800) Pulse Rate:  [72-115] 98 (06/20 0800) Resp:  [14-27] 25 (06/20 0800) BP: (92-121)/(36-59) 109/42 mmHg (06/20 0800) SpO2:  [97 %-100 %] 100 % (06/20 0800) Weight:  [164 lb 14.5 oz (74.8 kg)] 164 lb 14.5 oz (74.8 kg) (06/20 0142)   INTAKE / OUTPUT:  Intake/Output Summary (Last 24 hours) at 07/05/14 0947 Last data filed at 07/05/14 0600  Gross per 24 hour  Intake   1165 ml  Output   1775 ml  Net   -610 ml    PHYSICAL EXAMINATION: General:  Young adult female in NAD Neuro:  Awake/Alert, MAE HEENT:  OP clear, no stridor, PERRL Cardiovascular:  Regular, 2/6 systolic M Lungs:  Distant, few soft insp wheezes, no wheeze on exp.  Abdomen:  Soft, NT, + BS Musculoskeletal:  No edema Skin:  No rash  LABS:  CBC  Recent Labs Lab 07/04/14 0021 07/05/14 0340  WBC 15.2* 30.9*  HGB 11.3* 10.2*  HCT 33.2* 30.3*  PLT 195 191   Coag's  Recent Labs Lab 07/04/14 0605  INR 1.03   BMET  Recent Labs Lab 07/04/14 0021 07/04/14 0530 07/05/14 0340   NA 135 135 138  K 2.7* 4.1 3.7  CL 111 108 108  CO2 19* 12* 22  BUN 6 <5* 6  CREATININE 0.46 0.59 0.45  GLUCOSE 142* 154* 128*   Electrolytes  Recent Labs Lab 07/04/14 0021 07/04/14 0530 07/04/14 0605 07/05/14 0340  CALCIUM 8.6* 9.0  --  8.8*  MG  --   --  1.6*  --    ABG  Recent Labs Lab 07/04/14 0215  PHART 7.380  PCO2ART 26.9*  PO2ART 140*    Imaging No results found.   ASSESSMENT / PLAN:  PULMONARY A:  Asthma with acute exacerbation Multiple severe allergies including peanuts, latex > see list Allergic rhinitis P:   - continue solumedrol, reduce to Q12 6/20, with tx to oral prednisone 6/21 - scheduled duoneb - pulmicort nebs bid - singulair qhs - loratdine qd - careful to avoid allergens - continue GERD maintenance - doubt an acute infection based on her history, RVP has been sent and is pending  GASTROINTESTINAL A:   GERD P:   - continue home pepcid  - consider change to PPI if breakthrough sx  HEMATOLOGIC A:   Leukocytosis on steroids Hx Sickle cell trait P:  - monitor  INFECTIOUS A:   No evidence active infection, CXR clear P:   BCx2  6/19 >>  UC 6/19 >>  RVP 6/19 >>   - follow clinically off abx  ENDOCRINE A:   Hyperglycemia on steroids   P:   - follow CBG, add SSI   NEUROLOGIC A:   Seizure disorder - controlled now on Keppra P:   - continue Keppra, doubt the change from carbamazepine has caused her to flare based on the timing   OB A:  [redacted] Week pregnant  P: Fetal monitoring per OBGYN   FAMILY  - Updates: mother & patient updated at bedside     Noe Gens, NP-C [facility] Pgr: [phone] or [phone]  07/05/2014, 9:47 AM

## 2014-07-05 NOTE — Progress Notes (Signed)
PATIENT DETAILS Name: Jasmine [name] Age: 24 y.o. Sex: female Date of Birth: March 23, 1990 Admit Date: 07/03/2014 Admitting Physician Ivor Costa, MD PCP:No PCP Per Patient  Subjective: Feels much better  Assessment/Plan: Principal Problem: Asthma exacerbation:Much improved that on admission-Continue IV Steroids-but taper, and nebs. Transfer to med surg today.  Active Problems: Seizure disorder, sept 2015 last seizure:continue Keppra  GERD:continue Pepcid  [redacted] weeks pregnant:fetal heart monitoring every shift. Spoke with Dr Cyndia Bent (on 6/19) on call-to continue current treatment. OB RN will continue to monitor fetal heart rate.   Disposition: Remain inpatient-but transfer to med surg. Suspect home 6/21  Antimicrobial agents  See below  Anti-infectives    None      DVT Prophylaxis: Prophylactic Heparin   Code Status: Full code   Family Communication None at bedside  Procedures: None  CONSULTS:  pulmonary/intensive care  Time spent 25 minutes-Greater than 50% of this time was spent in counseling, explanation of diagnosis, planning of further management, and coordination of care.  MEDICATIONS: Scheduled Meds: . budesonide (PULMICORT) nebulizer solution  0.25 mg Nebulization BID  . dextromethorphan-guaiFENesin  1 tablet Oral BID  . famotidine  40 mg Oral QPM  . heparin subcutaneous  5,000 Units Subcutaneous 3 times per day  . ipratropium-albuterol  3 mL Nebulization Q6H  . levETIRAcetam  500 mg Oral BID  . loratadine  10 mg Oral Daily  . methylPREDNISolone (SOLU-MEDROL) injection  60 mg Intravenous Q12H  . montelukast  10 mg Oral QHS  . [START ON 07/06/2014] predniSONE  50 mg Oral Q breakfast  . prenatal multivitamin  1 tablet Oral Q1200   Continuous Infusions: . sodium chloride 75 mL/hr at 07/04/14 2328   PRN Meds:.albuterol, diphenhydrAMINE    PHYSICAL EXAM: Vital signs in last 24 hours: Filed Vitals:   07/05/14 0505 07/05/14  0600 07/05/14 0743 07/05/14 0800  BP: 100/41 92/36  109/42  Pulse: 72 86  98  Temp:    98.5 F (36.9 C)  TempSrc:    Oral  Resp: 18 21  25   Height:      Weight:      SpO2: 100% 98% 97% 100%    Weight change: -0.5 kg (-1 lb 1.6 oz) Filed Weights   07/04/14 0332 07/05/14 0142  Weight: 75.3 kg (166 lb 0.1 oz) 74.8 kg (164 lb 14.5 oz)   Body mass index is 29.22 kg/(m^2).   Gen Exam: Awake and alert with clear speech.  In mild distress. Neck: Supple, No JVD.   Chest: Good air entry bilaterally-only a few scattered rhonchi CVS: S1 S2 Regular, no murmurs.  Abdomen: soft, BS +, non tender, non distended.  Extremities: no edema, lower extremities warm to touch. Neurologic: Non Focal.   Skin: No Rash.   Wounds: N/A.    Intake/Output from previous day:  Intake/Output Summary (Last 24 hours) at 07/05/14 1040 Last data filed at 07/05/14 0600  Gross per 24 hour  Intake   1090 ml  Output   1775 ml  Net   -685 ml     LAB RESULTS: CBC  Recent Labs Lab 07/04/14 0021 07/05/14 0340  WBC 15.2* 30.9*  HGB 11.3* 10.2*  HCT 33.2* 30.3*  PLT 195 191  MCV 88.5 89.9  MCH 30.1 30.3  MCHC 34.0 33.7  RDW 13.5 13.8  LYMPHSABS 1.5  --   MONOABS 0.1  --   EOSABS 0.0  --   BASOSABS  0.0  --     Chemistries   Recent Labs Lab 07/04/14 0021 07/04/14 0530 07/04/14 0605 07/05/14 0340  NA 135 135  --  138  K 2.7* 4.1  --  3.7  CL 111 108  --  108  CO2 19* 12*  --  22  GLUCOSE 142* 154*  --  128*  BUN 6 <5*  --  6  CREATININE 0.46 0.59  --  0.45  CALCIUM 8.6* 9.0  --  8.8*  MG  --   --  1.6*  --     CBG: No results for input(s): GLUCAP in the last 168 hours.  GFR Estimated Creatinine Clearance: 106 mL/min (by C-G formula based on Cr of 0.45).  Coagulation profile  Recent Labs Lab 07/04/14 0605  INR 1.03    Cardiac Enzymes No results for input(s): CKMB, TROPONINI, MYOGLOBIN in the last 168 hours.  Invalid input(s): CK  Invalid input(s): POCBNP No results for  input(s): DDIMER in the last 72 hours. No results for input(s): HGBA1C in the last 72 hours. No results for input(s): CHOL, HDL, LDLCALC, TRIG, CHOLHDL, LDLDIRECT in the last 72 hours. No results for input(s): TSH, T4TOTAL, T3FREE, THYROIDAB in the last 72 hours.  Invalid input(s): FREET3 No results for input(s): VITAMINB12, FOLATE, FERRITIN, TIBC, IRON, RETICCTPCT in the last 72 hours. No results for input(s): LIPASE, AMYLASE in the last 72 hours.  Urine Studies No results for input(s): UHGB, CRYS in the last 72 hours.  Invalid input(s): UACOL, UAPR, USPG, UPH, UTP, UGL, UKET, UBIL, UNIT, UROB, ULEU, UEPI, UWBC, URBC, UBAC, CAST, UCOM, BILUA  MICROBIOLOGY: Recent Results (from the past 240 hour(s))  MRSA PCR Screening     Status: None   Collection Time: 07/04/14  3:42 AM  Result Value Ref Range Status   MRSA by PCR NEGATIVE NEGATIVE Final    Comment:        The GeneXpert MRSA Assay (FDA approved for NASAL specimens only), is one component of a comprehensive MRSA colonization surveillance program. It is not intended to diagnose MRSA infection nor to guide or monitor treatment for MRSA infections.     RADIOLOGY STUDIES/RESULTS: Dg Chest 2 View  07/04/2014   CLINICAL DATA:  Shortness of breath for 4 days, worsening. Ex-smoker. History of asthma and heart murmur. Patient was seen here Tuesday for the same asthma attack. Attack has been going on since before Tuesday and has not improved. Wheezing in the right lung.  EXAM: CHEST  2 VIEW  COMPARISON:  04/07/2014  FINDINGS: The heart size and mediastinal contours are within normal limits. Both lungs are clear. The visualized skeletal structures are unremarkable.  IMPRESSION: No active cardiopulmonary disease.   Electronically Signed   By: Lucienne Capers M.D.   On: 07/04/2014 00:28   US Ob Detail + 14 Wk  06/17/2014   OBSTETRICAL ULTRASOUND: This exam was performed within a [facility]. The OB US report was  generated in the AS system, and faxed to the ordering physician.   This report is available in the [employer]. See the AS Obstetric US report via the Image Link.   Oren Binet, MD  [facility] Pager:[phone]  If 7PM-7AM, please contact night-coverage [email or web address] Password [email or web address] 07/05/2014, 10:40 AM   LOS: 1 day

## 2014-07-05 NOTE — Progress Notes (Signed)
Date:  July 05, 2014 U.R. performed for needs and level of care. Will continue to follow for Case Management needs.  Rhonda Davis, RN, BSN, CCM   [phone] 

## 2014-07-05 NOTE — Care Management Note (Signed)
Case Management Note  Patient Details  Name: Jasmine [name] MRN: [id number] Date of Birth: 1990/04/20  Subjective/Objective:                 asthma   Action/Plan:     Home when stable   Expected Discharge Date:  07/06/14               Expected Discharge Plan:  Home/Self Care  In-House Referral:  NA  Discharge planning Services  CM Consult  Post Acute Care Choice:  NA Choice offered to:  NA  DME Arranged:  N/A DME Agency:     HH Arranged:  NA HH Agency:  NA  Status of Service:  In process, will continue to follow  Medicare Important Message Given:    Date Medicare IM Given:    Medicare IM give by:    Date Additional Medicare IM Given:    Additional Medicare Important Message give by:     If discussed at [place] of Stay Meetings, dates discussed:    Additional Comments:  Leeroy Cha, RN 07/05/2014, 11:24 AM

## 2014-07-05 NOTE — Progress Notes (Signed)
RROB in to doppler fhr=140-152; pt has no c/o vaginal bleeding, no leaking of fluid, and reports positive fetal movement

## 2014-07-05 NOTE — Progress Notes (Signed)
Pt.had yellow telemetry alert that alarmed irregular HR during the shift. EKG was done. Pt.was asymptomatic. EKG showed NSR with sinus arrhythmia with HR of 76.

## 2014-07-05 NOTE — Progress Notes (Signed)
PULMONARY / CRITICAL CARE MEDICINE   Name: Jasmine [name] MRN: [id number] DOB: 25-Nov-1990    ADMISSION DATE:  07/03/2014 CONSULTATION DATE:  07/04/14  REFERRING MD :  Sloan Leiter  CHIEF COMPLAINT:  Asthma, dyspnea  INITIAL PRESENTATION: 24 yo woman, [redacted] wks pregnant, hx asthma, [place] trait, seizure disorder, allergies, GERD. Admitted with asthma exacerbation 07/04/14. Moved to SDU with refractory wheeze. PCCM consulted  STUDIES:  CXR 6/19 (reviewed personally) >> no infiltrates  SIGNIFICANT EVENTS: 6/08  seen in ED with wheeze, acute AE asthma, treated w nebulized BD's, started prednisone but unable to take  6/19  admitted with refractory sx, wheezing 6/20  Off O2, no distress     SUBJECTIVE:  Feels better, denies cough / SOB.  Reports she has been in [place] for one year.  Previously had a home nebulizer machine but this was in [place] and it broke.    VITAL SIGNS: Temp:  [98.2 F (36.8 C)-99.3 F (37.4 C)] 98.5 F (36.9 C) (06/20 0800) Pulse Rate:  [72-115] 98 (06/20 0800) Resp:  [14-27] 25 (06/20 0800) BP: (92-121)/(36-59) 109/42 mmHg (06/20 0800) SpO2:  [97 %-100 %] 100 % (06/20 0800) Weight:  [164 lb 14.5 oz (74.8 kg)] 164 lb 14.5 oz (74.8 kg) (06/20 0142)   INTAKE / OUTPUT:  Intake/Output Summary (Last 24 hours) at 07/05/14 1008 Last data filed at 07/05/14 0600  Gross per 24 hour  Intake   1090 ml  Output   1775 ml  Net   -685 ml    PHYSICAL EXAMINATION: General:  Young adult female in NAD Neuro:  Awake/Alert, MAE HEENT:  OP clear, no stridor, PERRL Cardiovascular:  Regular, 2/6 systolic M Lungs:  Distant, few soft insp wheezes, no wheeze on exp.  Abdomen:  Soft, NT, + BS Musculoskeletal:  No edema Skin:  No rash  LABS:  CBC  Recent Labs Lab 07/04/14 0021 07/05/14 0340  WBC 15.2* 30.9*  HGB 11.3* 10.2*  HCT 33.2* 30.3*  PLT 195 191   Coag's  Recent Labs Lab 07/04/14 0605  INR 1.03   BMET  Recent Labs Lab 07/04/14 0021 07/04/14 0530 07/05/14 0340   NA 135 135 138  K 2.7* 4.1 3.7  CL 111 108 108  CO2 19* 12* 22  BUN 6 <5* 6  CREATININE 0.46 0.59 0.45  GLUCOSE 142* 154* 128*   Electrolytes  Recent Labs Lab 07/04/14 0021 07/04/14 0530 07/04/14 0605 07/05/14 0340  CALCIUM 8.6* 9.0  --  8.8*  MG  --   --  1.6*  --    ABG  Recent Labs Lab 07/04/14 0215  PHART 7.380  PCO2ART 26.9*  PO2ART 140*    Imaging No results found.   ASSESSMENT / PLAN:  PULMONARY A:  Asthma with acute exacerbation Multiple severe allergies including peanuts, latex > see list Allergic rhinitis P:   - continue solumedrol, reduce to Q12 6/20, with tx to oral prednisone 6/21 - pulmicort / brovana bid - singulair qhs - loratdine qd - careful to avoid allergens - continue GERD maintenance - doubt an acute infection based on her history, RVP has been sent and is pending - would plan to d/c on Advair with PRN albuterol, pred taper (40 x3, 20 x3, 10 x3 days), singulair + claritin  (will need Rx) - follow up with Pulmonary (arranged)   GASTROINTESTINAL A:   GERD P:   - continue home pepcid  - consider change to PPI if breakthrough sx  HEMATOLOGIC A:   Leukocytosis on  steroids Hx Sickle cell trait P:  - monitor  INFECTIOUS A:   No evidence active infection, CXR clear P:   BCx2  6/19 >>  UC 6/19 >>  RVP 6/19 >>   - follow clinically off abx  ENDOCRINE A:   Hyperglycemia on steroids   P:   - follow CBG, add SSI   NEUROLOGIC A:   Seizure disorder - controlled now on Keppra P:   - continue Keppra, doubt the change from carbamazepine has caused her to flare based on the timing   OB A:  [redacted] weeks pregnant  P: Fetal monitoring per OBGYN   FAMILY  - Updates: mother & patient updated at bedside    PCCM will be available PRN.  Please call back if new needs arise.   Noe Gens, NP-C [facility] Pgr: [phone] or [phone]  07/05/2014, 10:08 AM  PCCM ATTENDING: I have reviewed pt's initial  presentation, consultants notes and hospital database in detail.  The above assessment and plan was formulated under my direction.  In summary: She has apparently very severe asthma. Although there is no wheezing on exam today, her BS are markedly diminished. She insists that she is compliant with her maintenance regimen.   I have adjusted her bronchodilator regimen Will begin steroid taper OK to transfer to med-surg floor Will need pulmonary follow-up - this has been arranged Upon discharge, she should resume her Advair and montelukast as maintenance medications and albuterol as rescue med She should complete a prednisone taper from 60 mg per day to off over 10 days or so If she has recurrent breakthrough exacerbations (and is convincingly compliant with her med regimen) we could consider adding an anticholinergic agent such as tiotropium   PCCM will sign off. Please call if we can be of further assistance  Merton Border, MD;  PCCM service; [place] [phone]

## 2014-07-06 LAB — RESPIRATORY VIRUS PANEL
Adenovirus: NEGATIVE
INFLUENZA B 1: NEGATIVE
Influenza A: NEGATIVE
METAPNEUMOVIRUS: NEGATIVE
PARAINFLUENZA 3 A: NEGATIVE
Parainfluenza 1: NEGATIVE
Parainfluenza 2: NEGATIVE
Respiratory Syncytial Virus A: NEGATIVE
Respiratory Syncytial Virus B: NEGATIVE
Rhinovirus: NEGATIVE

## 2014-07-06 LAB — CBC
HCT: 29.8 % — ABNORMAL LOW (ref 36.0–46.0)
Hemoglobin: 10 g/dL — ABNORMAL LOW (ref 12.0–15.0)
MCH: 30.3 pg (ref 26.0–34.0)
MCHC: 33.6 g/dL (ref 30.0–36.0)
MCV: 90.3 fL (ref 78.0–100.0)
Platelets: 179 10*3/uL (ref 150–400)
RBC: 3.3 MIL/uL — ABNORMAL LOW (ref 3.87–5.11)
RDW: 14.3 % (ref 11.5–15.5)
WBC: 21.7 10*3/uL — ABNORMAL HIGH (ref 4.0–10.5)

## 2014-07-06 LAB — BASIC METABOLIC PANEL
ANION GAP: 7 (ref 5–15)
BUN: 7 mg/dL (ref 6–20)
CALCIUM: 8.6 mg/dL — AB (ref 8.9–10.3)
CO2: 25 mmol/L (ref 22–32)
CREATININE: 0.44 mg/dL (ref 0.44–1.00)
Chloride: 106 mmol/L (ref 101–111)
Glucose, Bld: 89 mg/dL (ref 65–99)
Potassium: 3.4 mmol/L — ABNORMAL LOW (ref 3.5–5.1)
SODIUM: 138 mmol/L (ref 135–145)

## 2014-07-06 LAB — MAGNESIUM: MAGNESIUM: 1.8 mg/dL (ref 1.7–2.4)

## 2014-07-06 LAB — LEVETIRACETAM LEVEL: Levetiracetam Lvl: 10 ug/mL (ref 10.0–40.0)

## 2014-07-06 MED ORDER — METHYLPREDNISOLONE 4 MG PO TBPK: ORAL_TABLET | ORAL | Status: DC

## 2014-07-06 MED ORDER — MONTELUKAST SODIUM 10 MG PO TABS: 10.0000 mg | ORAL_TABLET | Freq: Every day | ORAL | Status: DC

## 2014-07-06 MED ORDER — METHYLPREDNISOLONE 4 MG PO TBPK: 4.0000 mg | ORAL_TABLET | ORAL | Status: DC

## 2014-07-06 MED ORDER — ALBUTEROL SULFATE (2.5 MG/3ML) 0.083% IN NEBU: 2.5000 mg | INHALATION_SOLUTION | RESPIRATORY_TRACT | Status: DC | PRN

## 2014-07-06 MED ORDER — METHYLPREDNISOLONE 4 MG PO TBPK
8.0000 mg | ORAL_TABLET | Freq: Every evening | ORAL | Status: DC
Start: 2014-07-06 — End: 2014-07-06

## 2014-07-06 MED ORDER — LEVETIRACETAM 500 MG PO TABS: 500.0000 mg | ORAL_TABLET | Freq: Two times a day (BID) | ORAL | Status: DC

## 2014-07-06 MED ORDER — ALBUTEROL SULFATE HFA 108 (90 BASE) MCG/ACT IN AERS: 2.0000 | INHALATION_SPRAY | RESPIRATORY_TRACT | Status: DC | PRN

## 2014-07-06 MED ORDER — METHYLPREDNISOLONE 4 MG PO TBPK: 4.0000 mg | ORAL_TABLET | Freq: Three times a day (TID) | ORAL | Status: DC

## 2014-07-06 MED ORDER — METHYLPREDNISOLONE 4 MG PO TBPK
8.0000 mg | ORAL_TABLET | Freq: Every morning | ORAL | Status: DC
  Filled 2014-07-06: qty 21

## 2014-07-06 MED ORDER — METHYLPREDNISOLONE 4 MG PO TBPK: 4.0000 mg | ORAL_TABLET | Freq: Four times a day (QID) | ORAL | Status: DC

## 2014-07-06 MED ORDER — FLUTICASONE-SALMETEROL 250-50 MCG/DOSE IN AEPB: 1.0000 | INHALATION_SPRAY | Freq: Two times a day (BID) | RESPIRATORY_TRACT | Status: DC

## 2014-07-06 MED ORDER — METHYLPREDNISOLONE 4 MG PO TBPK: 8.0000 mg | ORAL_TABLET | Freq: Every evening | ORAL | Status: DC

## 2014-07-06 NOTE — Progress Notes (Signed)
RT in to give patient her breathing treatments, called by ICU for a STAT trip. Patient stated she was being discharged and didn't need RT to return for treatment. Lungs clear bilaterally.

## 2014-07-06 NOTE — Care Management Note (Signed)
Case Management Note  Patient Details  Name: Jasmine [name] MRN: [id number] Date of Birth: 01/17/1990  Subjective/Objective:         24 yo female admitted with asthma exacerbation           Action/Plan:  Spoke with patient and mother, patient is not currently assigned a PCP with Medicaid. Scheduled an appointment with the [facility] through Medicaid. Provided patient with pamphlet and encouraged patient to follow up and call to reschedule to remain in the system. Patient's mother stated that the patient does not follow up with her PCP appointments. Explained to patient that after 2 no shows she will not be eligible to return to [facility]. Also explained to patient that [facility] rep will deliver neb machine to room prior to dc. Lecretia with [facility] aware of neb machine order. Expected Discharge Date:  07/06/14               Expected Discharge Plan:  Home/Self Care  In-House Referral:  NA  Discharge planning Services  CM Consult  Post Acute Care Choice:  Durable Medical Equipment Choice offered to:  Patient  DME Arranged:  Nebulizer machine DME Agency:  [place]:    [place] Agency:  NA  Status of Service:  Completed, signed off  Medicare Important Message Given:    Date Medicare IM Given:    Medicare IM give by:    Date Additional Medicare IM Given:    Additional Medicare Important Message give by:     If discussed at [place] of Stay Meetings, dates discussed:    Additional Comments:  Scot Dock, RN 07/06/2014, 9:46 AM

## 2014-07-06 NOTE — Progress Notes (Signed)
DC instructions reviewed with patient. Patient denies further questions or concerns at this time. Pt has Neb machine to take home with her. Rx delivered home meds. Rapid Response from [facility] came and did fetal heart tones. No changes noted since am assessment. Patient to DC to home with mother. Patient to take Medrol according to Rx packaging.

## 2014-07-06 NOTE — Discharge Summary (Signed)
PATIENT DETAILS Name: Jasmine [name] Age: 24 y.o. Sex: female Date of Birth: 1990-06-09 MRN: [id number]. Admitting Physician: Ivor Costa, MD PCP:No PCP Per Patient  Admit Date: 07/03/2014 Discharge date: 07/06/2014  Recommendations for Outpatient Follow-up:  1. Please ensure follow-up with pulmonology-see below regarding appointment. 2. Appointment made at [facility] on 6/24-please ensure patient keeps this appointment as well.  PRIMARY DISCHARGE DIAGNOSIS:  Principal Problem:   Asthma exacerbation Active Problems:   Seizure disorder, sept 2015 last seizure   Asthma complicating pregnancy, antepartum   History of food anaphylaxis   GERD (gastroesophageal reflux disease)      PAST MEDICAL HISTORY: Past Medical History  Diagnosis Date  . Asthma   . Seizures   . Sickle cell trait   . Pancreatitis   . Heart murmur   . GERD (gastroesophageal reflux disease)     DISCHARGE MEDICATIONS: Current Discharge Medication List    START taking these medications   Details  albuterol (PROVENTIL) (2.5 MG/3ML) 0.083% nebulizer solution Take 3 mLs (2.5 mg total) by nebulization every 4 (four) hours as needed for wheezing or shortness of breath. Qty: 75 mL, Refills: 0    !! methylPREDNISolone (MEDROL DOSEPAK) 4 MG TBPK tablet 4 mg, Oral, (Dosepack) After lunch, First dose on Tue 07/06/14 at 1300, For 1 dose    !! methylPREDNISolone (MEDROL DOSEPAK) 4 MG TBPK tablet 4 mg, Oral, (Dosepack) After supper, First dose on Tue 07/06/14 at 1800, For 1 dose    !! methylPREDNISolone (MEDROL DOSEPAK) 4 MG TBPK tablet 4 mg, Oral, (Dosepack) 3 times daily around food, First dose on Wed 07/07/14 at 0800, For 3 doses    !! methylPREDNISolone (MEDROL DOSEPAK) 4 MG TBPK tablet 4 mg, Oral, (Dosepack) 4x daily tapering, First dose on Thu 07/08/14 at 0800, For 10 doses    !! methylPREDNISolone (MEDROL DOSEPAK) 4 MG TBPK tablet 8 mg, Oral, (Dosepack) Before breakfast, First dose on Tue  07/06/14 at 0915, For 1 dose    !! methylPREDNISolone (MEDROL DOSEPAK) 4 MG TBPK tablet 8 mg, Oral, (Dosepack) Nightly - one time, First dose on Tue 07/06/14 at 2200, For 1 dose    !! methylPREDNISolone (MEDROL DOSEPAK) 4 MG TBPK tablet 8 mg, Oral, (Dosepack) Nightly - one time, First dose on Wed 07/07/14 at 2200, For 1 dose    montelukast (SINGULAIR) 10 MG tablet Take 1 tablet (10 mg total) by mouth at bedtime. Qty: 30 tablet, Refills: 0     !! - Potential duplicate medications found. Please discuss with provider.    CONTINUE these medications which have CHANGED   Details  albuterol (PROVENTIL HFA;VENTOLIN HFA) 108 (90 BASE) MCG/ACT inhaler Inhale 2 puffs into the lungs every 4 (four) hours as needed for wheezing or shortness of breath. Qty: 1 Inhaler, Refills: 0    Fluticasone-Salmeterol (ADVAIR DISKUS) 250-50 MCG/DOSE AEPB Inhale 1 puff into the lungs 2 (two) times daily. Qty: 60 each, Refills: 0    levETIRAcetam (KEPPRA) 500 MG tablet Take 1 tablet (500 mg total) by mouth 2 (two) times daily. Qty: 60 tablet, Refills: 0      CONTINUE these medications which have NOT CHANGED   Details  EPINEPHrine (ADRENALIN) 1 MG/ML injection Inject 1 mL (1 mg total) into the muscle once. Qty: 1 mL, Refills: 1   Associated Diagnoses: Prenatal care in second trimester    Prenatal Multivit-Min-Fe-FA (PRENATAL VITAMINS) 0.8 MG tablet Take 1 tablet by mouth daily. Qty: 30 tablet, Refills: 12   Associated Diagnoses:  Supervision of high risk pregnancy, antepartum, unspecified trimester; Prenatal care in second trimester    famotidine (PEPCID) 40 MG tablet Take 1 tablet (40 mg total) by mouth every evening. Qty: 30 tablet, Refills: 6   Associated Diagnoses: Seizure disorder during pregnancy, antepartum, second trimester; Supervision of high risk pregnancy, antepartum, second trimester      STOP taking these medications     promethazine (PHENERGAN) 25 MG tablet      ranitidine (ZANTAC) 150 MG tablet          ALLERGIES:   Allergies  Allergen Reactions  . Avocado Anaphylaxis  . Cheese Shortness Of Breath    Asthma trigger  . Chocolate Shortness Of Breath    Asthma trigger  . Ibuprofen Hives and Shortness Of Breath    Tolerates naproxen  . Orange Juice [Orange Oil] Shortness Of Breath  . Other Hives    ALLERGIC TO ALL STEROIDS       EXCEPT IV SOLU MEDROL  . Peach [Prunus Persica] Anaphylaxis  . Peanuts [Peanut Oil] Anaphylaxis  . Pear Anaphylaxis  . Prednisone Hives  . Raspberry Anaphylaxis  . Tylenol [Acetaminophen] Hives and Shortness Of Breath  . Amoxicillin Hives  . Doxycycline Hives  . Erythromycin Hives  . Ivp Dye [Iodinated Diagnostic Agents]     Shortness of breath   . Latex     Swelling/anaphylaxis  . Milk Of Magnesia [Magnesium Hydroxide] Hives and Itching  . Penicillins Hives    BRIEF HPI:  See H&P, Labs, Consult and Test reports for all details in brief, patient is a 24 year old female with known history of asthma, seizure disorder who is currently [redacted] weeks pregnant admitted for shortness of breath. She was found to have asthma exacerbation and admitted for further evaluation and treatment  CONSULTATIONS:   pulmonary/intensive care  PERTINENT RADIOLOGIC STUDIES: Dg Chest 2 View  07/04/2014   CLINICAL DATA:  Shortness of breath for 4 days, worsening. Ex-smoker. History of asthma and heart murmur. Patient was seen here Tuesday for the same asthma attack. Attack has been going on since before Tuesday and has not improved. Wheezing in the right lung.  EXAM: CHEST  2 VIEW  COMPARISON:  04/07/2014  FINDINGS: The heart size and mediastinal contours are within normal limits. Both lungs are clear. The visualized skeletal structures are unremarkable.  IMPRESSION: No active cardiopulmonary disease.   Electronically Signed   By: Lucienne Capers M.D.   On: 07/04/2014 00:28   US Ob Detail + 14 Wk  06/17/2014   OBSTETRICAL ULTRASOUND: This exam was performed within a [facility] Ultrasound Department. The OB US report was generated in the AS system, and faxed to the ordering physician.   This report is available in the [employer]. See the [facility] Obstetric US report via the Image Link.    PERTINENT LAB RESULTS: CBC:  Recent Labs  07/05/14 0340 07/06/14 0528  WBC 30.9* 21.7*  HGB 10.2* 10.0*  HCT 30.3* 29.8*  PLT 191 179   CMET CMP     Component Value Date/Time   NA 138 07/06/2014 0528   K 3.4* 07/06/2014 0528   CL 106 07/06/2014 0528   CO2 25 07/06/2014 0528   GLUCOSE 89 07/06/2014 0528   BUN 7 07/06/2014 0528   CREATININE 0.44 07/06/2014 0528   CALCIUM 8.6* 07/06/2014 0528   PROT 7.0 04/08/2014 0650   ALBUMIN 4.0 04/08/2014 0650   AST 28 04/08/2014 0650   ALT 41* 04/08/2014 0650   ALKPHOS 43  04/08/2014 0650   BILITOT 0.5 04/08/2014 0650   GFRNONAA >60 07/06/2014 0528   GFRAA >60 07/06/2014 0528    GFR Estimated Creatinine Clearance: 106 mL/min (by C-G formula based on Cr of 0.44). No results for input(s): LIPASE, AMYLASE in the last 72 hours. No results for input(s): CKTOTAL, CKMB, CKMBINDEX, TROPONINI in the last 72 hours. Invalid input(s): POCBNP No results for input(s): DDIMER in the last 72 hours. No results for input(s): HGBA1C in the last 72 hours. No results for input(s): CHOL, HDL, LDLCALC, TRIG, CHOLHDL, LDLDIRECT in the last 72 hours. No results for input(s): TSH, T4TOTAL, T3FREE, THYROIDAB in the last 72 hours.  Invalid input(s): FREET3 No results for input(s): VITAMINB12, FOLATE, FERRITIN, TIBC, IRON, RETICCTPCT in the last 72 hours. Coags:  Recent Labs  07/04/14 0605  INR 1.03   Microbiology: Recent Results (from the past 240 hour(s))  Urine culture     Status: None   Collection Time: 07/04/14  3:41 AM  Result Value Ref Range Status   Specimen Description URINE, CLEAN CATCH  Final   Special Requests NONE  Final   Culture   Final    30,000 COLONIES/mL GRAM POSITIVE RODS 1,000 COLONIES/mL GRAM POSITIVE  COCCI Performed at [facility]    Report Status 07/05/2014 FINAL  Final  MRSA PCR Screening     Status: None   Collection Time: 07/04/14  3:42 AM  Result Value Ref Range Status   MRSA by PCR NEGATIVE NEGATIVE Final    Comment:        The GeneXpert MRSA Assay (FDA approved for NASAL specimens only), is one component of a comprehensive MRSA colonization surveillance program. It is not intended to diagnose MRSA infection nor to guide or monitor treatment for MRSA infections.   Respiratory virus panel     Status: None   Collection Time: 07/04/14  4:00 AM  Result Value Ref Range Status   Respiratory Syncytial Virus A Negative Negative Final   Respiratory Syncytial Virus B Negative Negative Final   Influenza A Negative Negative Final   Influenza B Negative Negative Final   Parainfluenza 1 Negative Negative Final   Parainfluenza 2 Negative Negative Final   Parainfluenza 3 Negative Negative Final   Metapneumovirus Negative Negative Final   Rhinovirus Negative Negative Final   Adenovirus Negative Negative Final    Comment: (NOTE) Performed At: [facility] [place], [place] [id number] Lindon Romp MD OF:[id number]   Culture, blood (routine x 2) Call MD if unable to obtain prior to antibiotics being given     Status: None (Preliminary result)   Collection Time: 07/04/14  5:51 AM  Result Value Ref Range Status   Specimen Description BLOOD LEFT HAND  Final   Special Requests BOTTLES DRAWN AEROBIC AND ANAEROBIC 10CC  Final   Culture   Final    NO GROWTH 1 DAY Performed at [facility]    Report Status PENDING  Incomplete  Culture, blood (routine x 2) Call MD if unable to obtain prior to antibiotics being given     Status: None (Preliminary result)   Collection Time: 07/04/14  6:05 AM  Result Value Ref Range Status   Specimen Description BLOOD RIGHT HAND  Final   Special Requests BOTTLES DRAWN AEROBIC AND ANAEROBIC 10CC  Final   Culture    Final    NO GROWTH 1 DAY Performed at [facility]    Report Status PENDING  Incomplete     BRIEF  HOSPITAL COURSE:   Principal Problem: Asthma exacerbation: Patient was admitted to the stepdown unit and treated with IV Solu-Medrol, scheduled bronchodilators. Pulmonology was also consulted and helped in management. She rapidly improve, by day of discharge, she was feeling significantly better, and ambulating in the hallway without any oxygen. On exam on the day of discharge-she was moving air well without any rhonchi. She was requesting discharge, since she was significantly improved and back to her usual baseline-she was discharged home. On discharge she was placed on a rescue inhaler with albuterol, maintenance inhaler with Advair, Singulair and a tapering dose of Medrol. She was asked to follow-up with pulmonology-follow-up appointment was made.  Active Problems:   Seizure disorder, sept 2015 last seizure: Continue Keppra    [redacted] weeks pregnant: This M.D. spoke with OB/GYN on call-we will recommended that we continue to treat asthma in the usual fashion. OB RN monitored fetal heart rate during this hospitalization.    GERD (gastroesophageal reflux disease): Continue with Pepcid  TODAY-DAY OF DISCHARGE:  Subjective:   Jasmine [name] today has no headache,no chest abdominal pain,no new weakness tingling or numbness, feels much better wants to go home today.   Objective:   Blood pressure 107/48, pulse 86, temperature 98.9 F (37.2 C), temperature source Oral, resp. rate 16, height 5\' 3"  (1.6 m), weight 74.8 kg (164 lb 14.5 oz), last menstrual period 01/24/2014, SpO2 94 %. No intake or output data in the 24 hours ending 07/06/14 0917 Filed Weights   07/04/14 0332 07/05/14 0142  Weight: 75.3 kg (166 lb 0.1 oz) 74.8 kg (164 lb 14.5 oz)    Exam Awake Alert, Oriented *3, No new F.N deficits, Normal affect Jasmine [name],PERRAL Supple Neck,No JVD, No cervical lymphadenopathy appriciated.   Symmetrical Chest wall movement, Good air movement bilaterally, CTAB RRR,No Gallops,Rubs or new Murmurs, No Parasternal Heave +ve B.Sounds, Abd Soft, Non tender, No organomegaly appriciated, No rebound -guarding or rigidity. No Cyanosis, Clubbing or edema, No new Rash or bruise  DISCHARGE CONDITION: Stable  DISPOSITION: Home  DISCHARGE INSTRUCTIONS:    Activity:  As tolerated   Diet recommendation: Regular Diet  Discharge Instructions    Call MD for:  difficulty breathing, headache or visual disturbances    Complete by:  As directed      Diet general    Complete by:  As directed      Increase activity slowly    Complete by:  As directed           Follow-up Information    Follow up with PARRETT,TAMMY, NP On 07/27/2014.   Specialty:  Nurse Practitioner   Why:  Appt at 4 PM for Asthma follow up   Contact information:   [address] [place] [address] [phone]      Total Time spent on discharge equals 25 minutes.  SignedOren Binet 07/06/2014 9:17 AM

## 2014-07-06 NOTE — Progress Notes (Signed)
RROB in to doppler fhr=145-155, no vaginal bleeding or leaking of fluid, no contractions, positive fetal movement.

## 2014-07-08 ENCOUNTER — Ambulatory Visit (INDEPENDENT_AMBULATORY_CARE_PROVIDER_SITE_OTHER): Payer: Medicaid Other | Admitting: Obstetrics and Gynecology

## 2014-07-08 ENCOUNTER — Encounter: Payer: Self-pay | Admitting: Obstetrics and Gynecology

## 2014-07-08 VITALS — BP 115/59 | HR 85 | Temp 98.5°F | Wt 165.8 lb

## 2014-07-08 DIAGNOSIS — J45901 Unspecified asthma with (acute) exacerbation: Secondary | ICD-10-CM | POA: Diagnosis not present

## 2014-07-08 DIAGNOSIS — J45909 Unspecified asthma, uncomplicated: Secondary | ICD-10-CM

## 2014-07-08 DIAGNOSIS — O9989 Other specified diseases and conditions complicating pregnancy, childbirth and the puerperium: Secondary | ICD-10-CM

## 2014-07-08 DIAGNOSIS — O0992 Supervision of high risk pregnancy, unspecified, second trimester: Secondary | ICD-10-CM | POA: Diagnosis present

## 2014-07-08 DIAGNOSIS — O99352 Diseases of the nervous system complicating pregnancy, second trimester: Secondary | ICD-10-CM

## 2014-07-08 DIAGNOSIS — G40909 Epilepsy, unspecified, not intractable, without status epilepticus: Secondary | ICD-10-CM

## 2014-07-08 DIAGNOSIS — O99519 Diseases of the respiratory system complicating pregnancy, unspecified trimester: Secondary | ICD-10-CM

## 2014-07-08 LAB — POCT URINALYSIS DIP (DEVICE)
Bilirubin Urine: NEGATIVE
Glucose, UA: NEGATIVE mg/dL
Hgb urine dipstick: NEGATIVE
KETONES UR: NEGATIVE mg/dL
Nitrite: NEGATIVE
PROTEIN: NEGATIVE mg/dL
SPECIFIC GRAVITY, URINE: 1.025 (ref 1.005–1.030)
Urobilinogen, UA: 0.2 mg/dL (ref 0.0–1.0)
pH: 6.5 (ref 5.0–8.0)

## 2014-07-08 NOTE — Progress Notes (Signed)
Subjective:  Jasmine [name] is a 24 y.o. G3P2002 at [redacted]w[redacted]d being seen today for ongoing prenatal care.  Patient reports no complaints.  Contractions: Not present.  Vag. Bleeding: None. Movement: Present. Denies leaking of fluid.   The following portions of the patient's history were reviewed and updated as appropriate: allergies, current medications, past family history, past medical history, past social history, past surgical history and problem list.   Objective:   Filed Vitals:   07/08/14 0923  BP: 115/59  Pulse: 85  Temp: 98.5 F (36.9 C)  Weight: 165 lb 12.8 oz (75.206 kg)    Fetal Status: Fetal Heart Rate (bpm): 153   Movement: Present     General:  Alert, oriented and cooperative. Patient is in no acute distress.  Skin: Skin is warm and dry. No rash noted.   Cardiovascular: Normal heart rate noted  Respiratory: Effort and breath sounds normal, no problems with respiration noted  Abdomen: Soft, gravid, appropriate for gestational age. Pain/Pressure: Absent     Vaginal: Vag. Bleeding: None.       Cervix: Not evaluated        Extremities: Normal range of motion.  Edema: None  Mental Status: Normal mood and affect. Normal behavior. Normal judgment and thought content.   Urinalysis: Urine Protein: Trace Urine Glucose: Negative  Assessment and Plan:  Pregnancy: G3P2002 at [redacted]w[redacted]d  1. Supervision of high risk pregnancy, antepartum, second trimester Patient is doing well without complaints Follow up ultrasound on 07/29/14  2. Asthma complicating pregnancy, antepartum   3. Asthma exacerbation   4. Seizure disorder during pregnancy, antepartum, second trimester Continue Keppra   General obstetric precautions including but not limited to vaginal bleeding, contractions, leaking of fluid and fetal movement were reviewed in detail with the patient.  Please refer to After Visit Summary for other counseling recommendations.   Return in about 4 weeks (around  08/05/2014).   Mora Bellman, MD

## 2014-07-09 ENCOUNTER — Ambulatory Visit: Payer: Medicaid Other | Attending: Physician Assistant | Admitting: Physician Assistant

## 2014-07-09 VITALS — BP 119/72 | HR 101 | Temp 98.2°F | Resp 18 | Ht 64.0 in | Wt 167.4 lb

## 2014-07-09 DIAGNOSIS — J45901 Unspecified asthma with (acute) exacerbation: Secondary | ICD-10-CM

## 2014-07-09 LAB — CULTURE, BLOOD (ROUTINE X 2)
CULTURE: NO GROWTH
CULTURE: NO GROWTH

## 2014-07-09 MED ORDER — LORATADINE 10 MG PO TABS: 10.0000 mg | ORAL_TABLET | Freq: Every day | ORAL | Status: DC

## 2014-07-09 NOTE — Progress Notes (Signed)
Patient here for HFU for asthma exacerbation. Patient reports her asthma has been well since she has been out of the hospital. Patient has not had any exacerbation. Patient reports pain in left arm. Fingers and hands turn grey when bend arm and have tingling sensation. Pain only occurs when she straightens the arm.

## 2014-07-09 NOTE — Progress Notes (Signed)
Jasmine [name]  QIW:[id number]  ERD:[id number]  DOB - 1990-01-18  Chief Complaint  Patient presents with  . Hospitalization Follow-up    asthma exacerbation       Subjective:   Jasmine [name] is a 24 y.o. female here today for establishment of care. She was hospitalized from June 18-21, 2016. She presented there through the emergency department with shortness of breath for 2 weeks. She was diagnosed with asthma exacerbation. She was seen by the pulmonologist as well. She was placed on nebs, Solu-Medrol, Mucinex. Her hospital course was only Located by hypokalemia which was treated. Her discharge regimen included a steroid taper, Advair, and potential need for Claritin in the future. She has not been compliant with the Advair. She continues on her steroid taper. She states that her breathing is much improved. She has not gone back to work. She denies chest pain. She denies shortness of breath.  ROS: GEN: denies fever or chills, denies change in weight Skin: denies lesions or rashes HEENT: denies headache, earache, epistaxis, sore throat, or neck pain LUNGS: denies SHOB, dyspnea, PND, orthopnea   ALLERGIES: Allergies  Allergen Reactions  . Avocado Anaphylaxis  . Cheese Shortness Of Breath    Asthma trigger  . Chocolate Shortness Of Breath    Asthma trigger  . Ibuprofen Hives and Shortness Of Breath    Tolerates naproxen  . Orange Juice [Orange Oil] Shortness Of Breath  . Other Hives    ALLERGIC TO ALL STEROIDS       EXCEPT IV SOLU MEDROL  . Peach [Prunus Persica] Anaphylaxis  . Peanuts [Peanut Oil] Anaphylaxis  . Pear Anaphylaxis  . Prednisone Hives  . Raspberry Anaphylaxis  . Tylenol [Acetaminophen] Hives and Shortness Of Breath  . Amoxicillin Hives  . Doxycycline Hives  . Erythromycin Hives  . Ivp Dye [Iodinated Diagnostic Agents]     Shortness of breath   . Latex     Swelling/anaphylaxis  . Milk Of Magnesia [Magnesium Hydroxide] Hives and Itching  . Penicillins  Hives    PAST MEDICAL HISTORY: Past Medical History  Diagnosis Date  . Asthma   . Seizures   . Sickle cell trait   . Pancreatitis   . Heart murmur   . GERD (gastroesophageal reflux disease)     PAST SURGICAL HISTORY: Past Surgical History  Procedure Laterality Date  . Nerve, tendon and artery repair Right 09/23/2012    Procedure: I&D and Repair As Necessary/Right Hand and Palm;  Surgeon: Roseanne Kaufman, MD;  Location: [facility];  Service: Orthopedics;  Laterality: Right;  . Tonsillectomy    . Hernia repair    . Cesarean section    . Hand surgery    . Appendectomy      MEDICATIONS AT HOME: Prior to Admission medications   Medication Sig Start Date End Date Taking? Authorizing Provider  albuterol (PROVENTIL HFA;VENTOLIN HFA) 108 (90 BASE) MCG/ACT inhaler Inhale 2 puffs into the lungs every 4 (four) hours as needed for wheezing or shortness of breath. 07/06/14  Yes Shanker Kristeen Mans, MD  albuterol (PROVENTIL) (2.5 MG/3ML) 0.083% nebulizer solution Take 3 mLs (2.5 mg total) by nebulization every 4 (four) hours as needed for wheezing or shortness of breath. 07/06/14  Yes Shanker Kristeen Mans, MD  EPINEPHrine (ADRENALIN) 1 MG/ML injection Inject 1 mL (1 mg total) into the muscle once. 06/10/14  Yes Nila Nephew, MD  Fluticasone-Salmeterol (ADVAIR DISKUS) 250-50 MCG/DOSE AEPB Inhale 1 puff into the lungs 2 (two) times daily. 07/06/14  Yes Shanker  Kristeen Mans, MD  levETIRAcetam (KEPPRA) 500 MG tablet Take 1 tablet (500 mg total) by mouth 2 (two) times daily. 07/06/14  Yes Shanker Kristeen Mans, MD  methylPREDNISolone (MEDROL DOSEPAK) 4 MG TBPK tablet 4 mg, Oral, (Dosepack) After lunch, First dose on Tue 07/06/14 at 1300, For 1 dose 07/06/14  Yes Shanker Kristeen Mans, MD  methylPREDNISolone (MEDROL DOSEPAK) 4 MG TBPK tablet 4 mg, Oral, (Dosepack) After supper, First dose on Tue 07/06/14 at 1800, For 1 dose 07/06/14  Yes Shanker Kristeen Mans, MD  methylPREDNISolone (MEDROL DOSEPAK) 4 MG TBPK tablet 4 mg, Oral,  (Dosepack) 3 times daily around food, First dose on Wed 07/07/14 at 0800, For 3 doses 07/07/14  Yes Shanker Kristeen Mans, MD  methylPREDNISolone (MEDROL DOSEPAK) 4 MG TBPK tablet 4 mg, Oral, (Dosepack) 4x daily tapering, First dose on Thu 07/08/14 at 0800, For 10 doses 07/08/14  Yes Shanker Kristeen Mans, MD  methylPREDNISolone (MEDROL DOSEPAK) 4 MG TBPK tablet 8 mg, Oral, (Dosepack) Before breakfast, First dose on Tue 07/06/14 at 0915, For 1 dose 07/06/14  Yes Shanker Kristeen Mans, MD  methylPREDNISolone (MEDROL DOSEPAK) 4 MG TBPK tablet 8 mg, Oral, (Dosepack) Nightly - one time, First dose on Tue 07/06/14 at 2200, For 1 dose 07/06/14  Yes Shanker Kristeen Mans, MD  methylPREDNISolone (MEDROL DOSEPAK) 4 MG TBPK tablet 8 mg, Oral, (Dosepack) Nightly - one time, First dose on Wed 07/07/14 at 2200, For 1 dose 07/07/14  Yes Shanker Kristeen Mans, MD  montelukast (SINGULAIR) 10 MG tablet Take 1 tablet (10 mg total) by mouth at bedtime. 07/06/14  Yes Shanker Kristeen Mans, MD  Prenatal Multivit-Min-Fe-FA (PRENATAL VITAMINS) 0.8 MG tablet Take 1 tablet by mouth daily. 06/10/14  Yes Nila Nephew, MD  famotidine (PEPCID) 40 MG tablet Take 1 tablet (40 mg total) by mouth every evening. Patient not taking: Reported on 06/23/2014 06/10/14 06/10/15  Woodroe Mode, MD  loratadine (CLARITIN) 10 MG tablet Take 1 tablet (10 mg total) by mouth daily. 07/09/14   Brayton Caves, PA-C     Objective:   Filed Vitals:   07/09/14 1555  BP: 119/72  Pulse: 101  Temp: 98.2 F (36.8 C)  TempSrc: Oral  Resp: 18  Height: 5\' 4"  (1.626 m)  Weight: 167 lb 6.4 oz (75.932 kg)  SpO2: 96%    Exam General appearance : Awake, alert, not in any distress. Speech Clear. Not toxic looking HEENT: Atraumatic and Normocephalic, pupils equally reactive to light and accomodation Neck: supple, no JVD. No cervical lymphadenopathy.  Chest:Good air entry bilaterally, no added sounds  CVS: S1 S2 regular, no murmurs.    Assessment & Plan  1. Recent Acute Asthma  Exacerbation-resolved  -back to work on Monday (note provided)  -Cont Nebs, Singulair, Steroid taper and albuterol as needed  -Encourage compliance with Advair  -Added Claritin  -Keep appt with Pulmonary 2. [redacted] weeks Gestation  -Routine OB appts as scheduled   Return in about 6 weeks (around 08/20/2014).  The patient was given clear instructions to go to ER or return to medical center if symptoms don't improve, worsen or new problems develop. The patient verbalized understanding. The patient was told to call to get lab results if they haven't heard anything in the next week.   This note has been created with Surveyor, quantity. Any transcriptional errors are unintentional.    Zettie Pho, PA-C [facility] and [facility] [place], [place]   07/09/2014, 4:23 PM

## 2014-07-27 ENCOUNTER — Other Ambulatory Visit: Payer: Self-pay | Admitting: Family Medicine

## 2014-07-27 ENCOUNTER — Ambulatory Visit (INDEPENDENT_AMBULATORY_CARE_PROVIDER_SITE_OTHER): Payer: Medicaid Other | Admitting: Adult Health

## 2014-07-27 ENCOUNTER — Encounter: Payer: Self-pay | Admitting: Adult Health

## 2014-07-27 VITALS — BP 100/60 | HR 82 | Temp 98.2°F | Ht 64.0 in | Wt 167.0 lb

## 2014-07-27 DIAGNOSIS — J45901 Unspecified asthma with (acute) exacerbation: Secondary | ICD-10-CM

## 2014-07-27 DIAGNOSIS — O34219 Maternal care for unspecified type scar from previous cesarean delivery: Secondary | ICD-10-CM

## 2014-07-27 DIAGNOSIS — Z3A24 24 weeks gestation of pregnancy: Secondary | ICD-10-CM

## 2014-07-27 DIAGNOSIS — G40909 Epilepsy, unspecified, not intractable, without status epilepticus: Secondary | ICD-10-CM

## 2014-07-27 DIAGNOSIS — O281 Abnormal biochemical finding on antenatal screening of mother: Secondary | ICD-10-CM

## 2014-07-27 DIAGNOSIS — O99352 Diseases of the nervous system complicating pregnancy, second trimester: Secondary | ICD-10-CM

## 2014-07-27 DIAGNOSIS — O283 Abnormal ultrasonic finding on antenatal screening of mother: Secondary | ICD-10-CM

## 2014-07-27 NOTE — Assessment & Plan Note (Signed)
Recent severe asthma exacerbation. During pregnancy Patient is much improved after steroids  She has been on a regimen of Advair and Singulair. Discussed with patient pregnancy category risk. Patient is doing well, could consider changing her to Pulmicort which has a better pregnancy category risk. Also consider holding Singulair if possible. Patient is to follow-up in 6 weeks. Have suggested that after her pregnancy. We do a full pulmonary function test . She does have multiple food allergies, could consider IgE and rast test as well.

## 2014-07-27 NOTE — Progress Notes (Signed)
   Subjective:    Patient ID: Jasmine [name], female    DOB: 28-Nov-1990, 24 y.o.   MRN: [id number]  HPI 24 year old female former smoker with asthma Patient is [redacted] weeks pregnant. She does have a seizure disorder and is on Keppra.  07/27/2014 [facility] follow up  Patient presents for a post hospital follow-up Patient was admitted June 18 to June 21 for asthma exacerbation. She says prior to admission. She been exposed to extreme heat at work. Has shortness of breath, wheezing and tightness. She was treated with IV steroids and  broncho-dilators. She was discharged on Medrol Dosepak. Restarted on Advair and Singulair. Since discharge. Patient says that she has been improved with decreased cough, shortness of breath and wheezing. She denies any increased albuterol use. She denies any abdominal pain, contractions, swelling, seizure activity, fever, or discolored mucus. Patient has 2 children at home, ages 48 and 53. She quit smoking in March of this year. He denies any drug use. Works at [employer].    Review of Systems Constitutional:   No  weight loss, night sweats,  Fevers, chills, fatigue, or  lassitude.  HEENT:   No headaches,  Difficulty swallowing,  Tooth/dental problems, or  Sore throat,                No sneezing, itching, ear ache,  +nasal congestion, post nasal drip,   CV:  No chest pain,  Orthopnea, PND, swelling in lower extremities, anasarca, dizziness, palpitations, syncope.   GI  No heartburn, indigestion, abdominal pain, nausea, vomiting, diarrhea, change in bowel habits, loss of appetite, bloody stools.   Resp: No shortness of breath with exertion or at rest.  No excess mucus, no productive cough,  No non-productive cough,  No coughing up of blood.  No change in color of mucus.  No wheezing.  No chest wall deformity  Skin: no rash or lesions.  GU: no dysuria, change in color of urine, no urgency or frequency.  No flank pain, no hematuria   MS:  No joint pain or  swelling.  No decreased range of motion.  No back pain.  Psych:  No change in mood or affect. No depression or anxiety.  No memory loss.         Objective:   Physical Exam  GEN: A/Ox3; pleasant , NAD, well nourished   HEENT:  [place]/AT,  EACs-clear, TMs-wnl, NOSE-clear, THROAT-clear, no lesions, no postnasal drip or exudate noted.   NECK:  Supple w/ fair ROM; no JVD; normal carotid impulses w/o bruits; no thyromegaly or nodules palpated; no lymphadenopathy.  RESP  Clear  P & A; w/o, wheezes/ rales/ or rhonchi.no accessory muscle use, no dullness to percussion  CARD:  RRR, no m/r/g  , no peripheral edema, pulses intact, no cyanosis or clubbing.  GI:   Soft & nt; nml bowel sounds; no organomegaly or masses detected.consistent with pregnancy  Musco: Warm bil, no deformities or joint swelling noted.   Neuro: alert, no focal deficits noted.    Skin: Warm, no lesions or rashes        Assessment & Plan:

## 2014-07-27 NOTE — Patient Instructions (Signed)
Continue on current regimen .  follow up Dr. Lamonte Sakai  In 6 weeks and As needed

## 2014-07-29 ENCOUNTER — Encounter (HOSPITAL_COMMUNITY): Payer: Self-pay

## 2014-07-29 ENCOUNTER — Ambulatory Visit (HOSPITAL_COMMUNITY)
Admission: RE | Admit: 2014-07-29 | Discharge: 2014-07-29 | Disposition: A | Discharge status: Home / Self Care | Payer: Medicaid Other | Source: Ambulatory Visit | Attending: Family Medicine | Admitting: Family Medicine

## 2014-07-29 VITALS — BP 120/68 | HR 79 | Wt 170.4 lb

## 2014-07-29 DIAGNOSIS — O3421 Maternal care for scar from previous cesarean delivery: Secondary | ICD-10-CM | POA: Insufficient documentation

## 2014-07-29 DIAGNOSIS — O0992 Supervision of high risk pregnancy, unspecified, second trimester: Secondary | ICD-10-CM

## 2014-07-29 DIAGNOSIS — O99350 Diseases of the nervous system complicating pregnancy, unspecified trimester: Secondary | ICD-10-CM

## 2014-07-29 DIAGNOSIS — G40909 Epilepsy, unspecified, not intractable, without status epilepticus: Secondary | ICD-10-CM | POA: Diagnosis not present

## 2014-07-29 DIAGNOSIS — O99352 Diseases of the nervous system complicating pregnancy, second trimester: Secondary | ICD-10-CM | POA: Insufficient documentation

## 2014-07-29 DIAGNOSIS — O283 Abnormal ultrasonic finding on antenatal screening of mother: Secondary | ICD-10-CM

## 2014-07-29 DIAGNOSIS — O281 Abnormal biochemical finding on antenatal screening of mother: Secondary | ICD-10-CM | POA: Diagnosis not present

## 2014-07-29 DIAGNOSIS — Z3A24 24 weeks gestation of pregnancy: Secondary | ICD-10-CM | POA: Diagnosis not present

## 2014-07-29 DIAGNOSIS — O34219 Maternal care for unspecified type scar from previous cesarean delivery: Secondary | ICD-10-CM

## 2014-07-30 ENCOUNTER — Encounter (HOSPITAL_COMMUNITY): Payer: Self-pay | Admitting: *Deleted

## 2014-07-30 ENCOUNTER — Inpatient Hospital Stay (HOSPITAL_COMMUNITY)
Admission: AD | Admit: 2014-07-30 | Discharge: 2014-07-30 | Disposition: A | Discharge status: Home / Self Care | Payer: Medicaid Other | Source: Ambulatory Visit | Attending: Family Medicine | Admitting: Family Medicine

## 2014-07-30 DIAGNOSIS — O3421 Maternal care for scar from previous cesarean delivery: Secondary | ICD-10-CM | POA: Diagnosis not present

## 2014-07-30 DIAGNOSIS — Z3A25 25 weeks gestation of pregnancy: Secondary | ICD-10-CM

## 2014-07-30 DIAGNOSIS — O98312 Other infections with a predominantly sexual mode of transmission complicating pregnancy, second trimester: Secondary | ICD-10-CM | POA: Diagnosis not present

## 2014-07-30 DIAGNOSIS — A5901 Trichomonal vulvovaginitis: Secondary | ICD-10-CM | POA: Insufficient documentation

## 2014-07-30 DIAGNOSIS — Z87891 Personal history of nicotine dependence: Secondary | ICD-10-CM | POA: Insufficient documentation

## 2014-07-30 DIAGNOSIS — R109 Unspecified abdominal pain: Secondary | ICD-10-CM | POA: Diagnosis present

## 2014-07-30 DIAGNOSIS — Z3A24 24 weeks gestation of pregnancy: Secondary | ICD-10-CM | POA: Diagnosis not present

## 2014-07-30 LAB — URINALYSIS, ROUTINE W REFLEX MICROSCOPIC
BILIRUBIN URINE: NEGATIVE
Glucose, UA: NEGATIVE mg/dL
Hgb urine dipstick: NEGATIVE
KETONES UR: NEGATIVE mg/dL
NITRITE: NEGATIVE
PH: 6 (ref 5.0–8.0)
Protein, ur: NEGATIVE mg/dL
Specific Gravity, Urine: >1.03 — ABNORMAL HIGH (ref 1.005–1.030)
Urobilinogen, UA: 0.2 mg/dL (ref 0.0–1.0)

## 2014-07-30 LAB — URINE MICROSCOPIC-ADD ON

## 2014-07-30 MED ORDER — METRONIDAZOLE 500 MG PO TABS
2000.0000 mg | ORAL_TABLET | Freq: Once | ORAL | Status: AC
  Administered 2014-07-30: 2000 mg via ORAL
  Filled 2014-07-30: qty 4

## 2014-07-30 NOTE — MAU Note (Signed)
Having pelvic pressure since last night. Some vomiting last night but better now. Some abd cramping. Denies bleeding or LOF. Some white, mucousy d/c without odor.

## 2014-07-30 NOTE — MAU Provider Note (Signed)
History     CSN: [id number]  Arrival date and time: 07/30/14 1856   None     Chief Complaint  Patient presents with  . Abdominal Cramping   HPI Jasmine [name] is a 24yo G3P2002 @ 24.6wks by 6wk scan who presents for eval of low abd cramping. Denies leaking or bldg. Reports white vag d/c. Her preg has been followed by the [facility] and is remarkable for 1) elevated quad screen w/ nl panorama 2) THC use in preg 3) EIF for f/u at 31wks 4) prev C/S 5) asthma 6) seizure d/o during preg 7) mult food allergies. States her partner lives in [place] and she has not had sex since she became preg.  OB History    Gravida Para Term Preterm AB TAB SAB Ectopic Multiple Living   3 2 2       2       Past Medical History  Diagnosis Date  . Asthma   . Seizures   . Sickle cell trait   . Pancreatitis   . Heart murmur   . GERD (gastroesophageal reflux disease)     Past Surgical History  Procedure Laterality Date  . Nerve, tendon and artery repair Right 09/23/2012    Procedure: I&D and Repair As Necessary/Right Hand and Palm;  Surgeon: Roseanne Kaufman, MD;  Location: [facility];  Service: Orthopedics;  Laterality: Right;  . Tonsillectomy    . Hernia repair    . Cesarean section    . Hand surgery    . Appendectomy      Family History  Problem Relation Age of Onset  . Cancer Mother     cervical cancer  . Asthma Mother   . Hypertension Father   . Sickle cell anemia Father   . Asthma Sister   . Diabetes Maternal Aunt   . Cancer Maternal Grandmother     History  Substance Use Topics  . Smoking status: Former Smoker    Types: Cigarettes    Quit date: 03/27/2014  . Smokeless tobacco: Never Used  . Alcohol Use: No    Allergies:  Allergies  Allergen Reactions  . Avocado Anaphylaxis  . Cheese Shortness Of Breath    Asthma trigger  . Chocolate Shortness Of Breath    Asthma trigger  . Ibuprofen Hives and Shortness Of Breath    Tolerates naproxen  . Orange Juice [Orange Oil] Shortness Of Breath  .  Other Hives    ALLERGIC TO ALL STEROIDS       EXCEPT IV SOLU MEDROL  . Peach [Prunus Persica] Anaphylaxis  . Peanuts [Peanut Oil] Anaphylaxis  . Pear Anaphylaxis  . Prednisone Hives  . Raspberry Anaphylaxis  . Tylenol [Acetaminophen] Hives and Shortness Of Breath  . Amoxicillin Hives  . Doxycycline Hives  . Erythromycin Hives  . Ivp Dye [Iodinated Diagnostic Agents]     Shortness of breath   . Latex     Swelling/anaphylaxis  . Milk Of Magnesia [Magnesium Hydroxide] Hives and Itching  . Penicillins Hives    Prescriptions prior to admission  Medication Sig Dispense Refill Last Dose  . Fluticasone-Salmeterol (ADVAIR DISKUS) 250-50 MCG/DOSE AEPB Inhale 1 puff into the lungs 2 (two) times daily. 60 each 0 07/30/2014 at Unknown time  . levETIRAcetam (KEPPRA) 500 MG tablet Take 1 tablet (500 mg total) by mouth 2 (two) times daily. 60 tablet 0 07/30/2014 at Unknown time  . montelukast (SINGULAIR) 10 MG tablet Take 1 tablet (10 mg total) by mouth at bedtime. 30 tablet  0 07/29/2014 at Unknown time  . Prenatal Multivit-Min-Fe-FA (PRENATAL VITAMINS) 0.8 MG tablet Take 1 tablet by mouth daily. 30 tablet 12 07/30/2014 at Unknown time  . albuterol (PROVENTIL HFA;VENTOLIN HFA) 108 (90 BASE) MCG/ACT inhaler Inhale 2 puffs into the lungs every 4 (four) hours as needed for wheezing or shortness of breath. 1 Inhaler 0 rescue  . albuterol (PROVENTIL) (2.5 MG/3ML) 0.083% nebulizer solution Take 3 mLs (2.5 mg total) by nebulization every 4 (four) hours as needed for wheezing or shortness of breath. 75 mL 0 rescue  . EPINEPHrine (ADRENALIN) 1 MG/ML injection Inject 1 mL (1 mg total) into the muscle once. 1 mL 1 rescue  . methylPREDNISolone (MEDROL DOSEPAK) 4 MG TBPK tablet 4 mg, Oral, (Dosepack) After supper, First dose on Tue 07/06/14 at 1800, For 1 dose (Patient not taking: Reported on 07/29/2014)   Not Taking  . methylPREDNISolone (MEDROL DOSEPAK) 4 MG TBPK tablet 4 mg, Oral, (Dosepack) 3 times daily around food,  First dose on Wed 07/07/14 at 0800, For 3 doses (Patient not taking: Reported on 07/29/2014)   Not Taking    ROS Physical Exam   Blood pressure 117/61, pulse 100, temperature 98.9 F (37.2 C), resp. rate 18, height 5\' 4"  (1.626 m), weight 77.747 kg (171 lb 6.4 oz), last menstrual period 01/24/2014, SpO2 99 %.  Physical Exam  Constitutional: She is oriented to person, place, and time. She appears well-developed.  HENT:  Head: Normocephalic.  Neck: Normal range of motion.  Cardiovascular: Normal rate.   Respiratory: Effort normal.  GI:  EFM 130s, +accels, no decels, occ mi variables Toco: occ UI  Genitourinary:  Cx C/L  Musculoskeletal: Normal range of motion.  Neurological: She is alert and oriented to person, place, and time.  Skin: Skin is warm and dry.  Psychiatric: She has a normal mood and affect. Her behavior is normal. Thought content normal.    MAU Course  Procedures  MDM NST read UA ordered Flagyl 2gm PO x 1 GC/chlam pending  Assessment and Plan  IUP@ 24.6wks Trichomonas Abd cramping in preg  D/C home Given instructions on having partner tx ASAP F/U as scheduled at next visit  Serita Grammes 07/30/2014, 10:02 PM

## 2014-07-30 NOTE — Discharge Instructions (Signed)

## 2014-08-05 ENCOUNTER — Encounter: Payer: Self-pay | Admitting: *Deleted

## 2014-08-05 ENCOUNTER — Ambulatory Visit (INDEPENDENT_AMBULATORY_CARE_PROVIDER_SITE_OTHER): Payer: Medicaid Other | Admitting: Family Medicine

## 2014-08-05 VITALS — BP 126/64 | HR 81 | Temp 98.7°F | Wt 170.7 lb

## 2014-08-05 DIAGNOSIS — J45909 Unspecified asthma, uncomplicated: Secondary | ICD-10-CM

## 2014-08-05 DIAGNOSIS — O9989 Other specified diseases and conditions complicating pregnancy, childbirth and the puerperium: Secondary | ICD-10-CM | POA: Diagnosis not present

## 2014-08-05 DIAGNOSIS — G40909 Epilepsy, unspecified, not intractable, without status epilepticus: Secondary | ICD-10-CM | POA: Diagnosis not present

## 2014-08-05 DIAGNOSIS — O3421 Maternal care for scar from previous cesarean delivery: Secondary | ICD-10-CM | POA: Diagnosis not present

## 2014-08-05 DIAGNOSIS — O0992 Supervision of high risk pregnancy, unspecified, second trimester: Secondary | ICD-10-CM | POA: Diagnosis not present

## 2014-08-05 DIAGNOSIS — O99352 Diseases of the nervous system complicating pregnancy, second trimester: Secondary | ICD-10-CM | POA: Diagnosis not present

## 2014-08-05 DIAGNOSIS — O99519 Diseases of the respiratory system complicating pregnancy, unspecified trimester: Secondary | ICD-10-CM

## 2014-08-05 DIAGNOSIS — O34219 Maternal care for unspecified type scar from previous cesarean delivery: Secondary | ICD-10-CM

## 2014-08-05 LAB — POCT URINALYSIS DIP (DEVICE)
Bilirubin Urine: NEGATIVE
GLUCOSE, UA: NEGATIVE mg/dL
Hgb urine dipstick: NEGATIVE
Ketones, ur: NEGATIVE mg/dL
Leukocytes, UA: NEGATIVE
Nitrite: NEGATIVE
PROTEIN: NEGATIVE mg/dL
Specific Gravity, Urine: 1.02 (ref 1.005–1.030)
Urobilinogen, UA: 0.2 mg/dL (ref 0.0–1.0)
pH: 7 (ref 5.0–8.0)

## 2014-08-05 NOTE — Patient Instructions (Signed)
Trial of Labor After Cesarean Delivery Information A trial of labor after cesarean delivery (TOLAC) is when a woman tries to give birth vaginally after a previous cesarean delivery. TOLAC may be a safe and appropriate option for you depending on your medical history and other risk factors. When TOLAC is successful and you are able to have a vaginal delivery, this is called a vaginal birth after cesarean delivery (VBAC).  CANDIDATES FOR TOLAC TOLAC is possible for some women who:  Have undergone one or two prior cesarean deliveries in which the incision of the uterus was horizontal (low transverse).  Are carrying twins and have had one prior low transverse incision during a cesarean delivery.  Do not have a vertical (classical) uterine scar.  Have not had a tear in the wall of their uterus (uterine rupture). TOLAC is also supported for women who meet appropriate criteria and:  Are under the age of 76 years.  Are tall and have a body mass index (BMI) of less than 30.  Have an unknown uterine scar.  Give birth in a facility equipped to handle an emergency cesarean delivery. This team should be able to handle possible complications such as a uterine rupture.  Have thorough counseling about the benefits and risks of TOLAC.  Have discussed future pregnancy plans with their health care provider.  Plan to have several more pregnancies. MOST SUCCESSFUL CANDIDATES FOR TOLAC:  Have had a successful vaginal delivery before or after their cesarean delivery.  Experience labor that begins naturally on or before the due date (40 weeks of gestation).  Do not have a very large (macrosomic) baby.   Had a prior cesarean delivery but are not currently experiencing factors that would prompt a cesarean delivery (such as a breech position).  Had only one prior cesarean delivery.  Had a prior cesarean delivery that was performed early in labor and not after full cervical dilation. TOLAC may be most  appropriate for women who meet the above guidelines and who plan to have more pregnancies. TOLAC is not recommended for home births. LEAST SUCCESSFUL CANDIDATES FOR TOLAC:  Have an induced labor with an unfavorable cervix. An unfavorable cervix is when the cervix is not dilating enough (among other factors).  Have never had a vaginal delivery.  Have had more than two cesarean deliveries.  Have a pregnancy at more than 40 weeks of gestation.  Are pregnant with a baby with a suspected weight greater than 4,000 grams (8 pounds) and who have no prior history of a vaginal delivery.  Have closely spaced pregnancies. SUGGESTED BENEFITS OF TOLAC  You may have a faster recovery time.  You may have a shorter stay in the hospital.  You may have less pain and fewer problems than with a cesarean delivery. Women who have a cesarean delivery have a higher chance of needing blood or getting a fever, an infection, or a blood clot in the legs. SUGGESTED RISKS OF TOLAC The highest risk of complications happens to women who attempt a TOLAC and fail. A failed TOLAC results in an unplanned cesarean delivery. Risks related to [facility] or repeat cesarean deliveries include:   Blood loss.  Infection.  Blood clot.  Injury to surrounding tissues or organs.  Having to remove the uterus (hysterectomy).  Potential problems with the placenta (such as placenta previa or placenta accreta) in future pregnancies. Although very rare, the main concerns with TOLAC are:  Rupture of the uterine scar from a past cesarean delivery.  Needing an  emergency cesarean delivery.  Having a bad outcome for the baby (perinatal morbidity). FOR MORE INFORMATION [employer] of Obst[employer]ians [employer] Gynec[employer]: www.acog.[place]: www.midwife.org Document Released: 09/19/2010 Document Revised: 10/22/2012 Document Reviewed: 06/23/2012 [facility] Patient Information 2015 [place], [place]. This  information is not intended to replace advice given to you by your health care provider. Make sure you discuss any questions you have with your health care provider.

## 2014-08-05 NOTE — Progress Notes (Signed)
Subjective:  Jasmine [name] is a 24 y.o. G3P2002 at [redacted]w[redacted]d being seen today for ongoing prenatal care.  Patient reports no complaints.  Contractions: Not present.  Vag. Bleeding: None. Movement: Present. Denies leaking of fluid.   Has been on Advair, which she has been taking twice a day.  Pulmonology has changed her to pulmicort due to concerns of Advair being a category C medication, but patient hasn't started taking it.  In reviewing the records, her last asthma exacerbation was in June.  Hasn't needed to use albuterol inhaler for over 1 month.  No seizures.  Taking Keppra.  No side effects of medications.  Last pregnancy had c-section due to breech.  The following portions of the patient's history were reviewed and updated as appropriate: allergies, current medications, past family history, past medical history, past social history, past surgical history and problem list.   Objective:   Filed Vitals:   08/05/14 0814  BP: 126/64  Pulse: 81  Temp: 98.7 F (37.1 C)  Weight: 170 lb 11.2 oz (77.429 kg)    Fetal Status: Fetal Heart Rate (bpm): 150   Movement: Present     General:  Alert, oriented and cooperative. Patient is in no acute distress.  Skin: Skin is warm and dry. No rash noted.   Cardiovascular: Normal heart rate noted  Respiratory: Normal respiratory effort, no problems with respiration noted  Abdomen: Soft, gravid, appropriate for gestational age. Pain/Pressure: Present     Vaginal: Vag. Bleeding: None.       Cervix: Not evaluated        Extremities: Normal range of motion.  Edema: Trace  Mental Status: Normal mood and affect. Normal behavior. Normal judgment and thought content.   Urinalysis: Urine Protein: Negative Urine Glucose: Negative  Assessment and Plan:  Pregnancy: G3P2002 at [redacted]w[redacted]d  1. Supervision of high risk pregnancy, antepartum, second trimester Normal FHT, fundal height.  1hr GTT next appt with 28 week labs.  2. Seizure disorder during pregnancy,  antepartum, second trimester Controlled. Continue Keppra  3. Asthma complicating pregnancy, antepartum I discussed with her that while Pulmicort is a reasonable medication, Advair is also an okay medication to take while pregnant. If she doesn't tolerate Pulmicort very well or is not well controlled on Pulmicort, I would recommend changing her back to the Advair.  It would be safer to have her well controlled on Advair, than being on the Pulmicort and needing to use her Albuterol inhaler multiple times a week.  I will send her pulmonologist a copy of my note for reference.    4. Previous cesarean section complicating pregnancy, antepartum condition or complication Discussed TOLAC vs repeat cesarean section.  Pt would like to TOLAC.  Consent signed.  Preterm labor symptoms and general obstetric precautions including but not limited to vaginal bleeding, contractions, leaking of fluid and fetal movement were reviewed in detail with the patient. Please refer to After Visit Summary for other counseling recommendations.  Return in about 4 weeks (around 09/02/2014).   Truett Mainland, DO

## 2014-08-05 NOTE — Progress Notes (Signed)
Edema- feet    Pressure- lower abd

## 2014-08-07 ENCOUNTER — Other Ambulatory Visit: Payer: Self-pay | Admitting: Family Medicine

## 2014-09-01 ENCOUNTER — Other Ambulatory Visit (HOSPITAL_COMMUNITY): Payer: Self-pay | Admitting: Family Medicine

## 2014-09-01 DIAGNOSIS — Z3A30 30 weeks gestation of pregnancy: Secondary | ICD-10-CM

## 2014-09-01 DIAGNOSIS — G40909 Epilepsy, unspecified, not intractable, without status epilepticus: Secondary | ICD-10-CM

## 2014-09-01 DIAGNOSIS — J45909 Unspecified asthma, uncomplicated: Secondary | ICD-10-CM

## 2014-09-01 DIAGNOSIS — O34219 Maternal care for unspecified type scar from previous cesarean delivery: Secondary | ICD-10-CM

## 2014-09-01 DIAGNOSIS — O281 Abnormal biochemical finding on antenatal screening of mother: Secondary | ICD-10-CM

## 2014-09-01 DIAGNOSIS — O99353 Diseases of the nervous system complicating pregnancy, third trimester: Secondary | ICD-10-CM

## 2014-09-01 DIAGNOSIS — O283 Abnormal ultrasonic finding on antenatal screening of mother: Secondary | ICD-10-CM

## 2014-09-01 DIAGNOSIS — O99519 Diseases of the respiratory system complicating pregnancy, unspecified trimester: Secondary | ICD-10-CM

## 2014-09-02 ENCOUNTER — Ambulatory Visit (INDEPENDENT_AMBULATORY_CARE_PROVIDER_SITE_OTHER): Payer: Medicaid Other | Admitting: Family Medicine

## 2014-09-02 VITALS — BP 106/64 | HR 92 | Temp 98.4°F | Wt 171.6 lb

## 2014-09-02 DIAGNOSIS — O0992 Supervision of high risk pregnancy, unspecified, second trimester: Secondary | ICD-10-CM | POA: Diagnosis present

## 2014-09-02 DIAGNOSIS — Z23 Encounter for immunization: Secondary | ICD-10-CM

## 2014-09-02 DIAGNOSIS — J45909 Unspecified asthma, uncomplicated: Secondary | ICD-10-CM | POA: Diagnosis not present

## 2014-09-02 DIAGNOSIS — O9989 Other specified diseases and conditions complicating pregnancy, childbirth and the puerperium: Secondary | ICD-10-CM | POA: Diagnosis not present

## 2014-09-02 DIAGNOSIS — O99519 Diseases of the respiratory system complicating pregnancy, unspecified trimester: Secondary | ICD-10-CM

## 2014-09-02 LAB — CBC
HEMATOCRIT: 35.2 % — AB (ref 36.0–46.0)
HEMOGLOBIN: 12.1 g/dL (ref 12.0–15.0)
MCH: 29.6 pg (ref 26.0–34.0)
MCHC: 34.4 g/dL (ref 30.0–36.0)
MCV: 86.1 fL (ref 78.0–100.0)
MPV: 11.4 fL (ref 8.6–12.4)
PLATELETS: 197 10*3/uL (ref 150–400)
RBC: 4.09 MIL/uL (ref 3.87–5.11)
RDW: 13.5 % (ref 11.5–15.5)
WBC: 14.9 10*3/uL — ABNORMAL HIGH (ref 4.0–10.5)

## 2014-09-02 LAB — POCT URINALYSIS DIP (DEVICE)
BILIRUBIN URINE: NEGATIVE
Glucose, UA: NEGATIVE mg/dL
HGB URINE DIPSTICK: NEGATIVE
Ketones, ur: NEGATIVE mg/dL
Leukocytes, UA: NEGATIVE
NITRITE: NEGATIVE
PH: 7 (ref 5.0–8.0)
Protein, ur: NEGATIVE mg/dL
SPECIFIC GRAVITY, URINE: 1.015 (ref 1.005–1.030)
Urobilinogen, UA: 1 mg/dL (ref 0.0–1.0)

## 2014-09-02 MED ORDER — TETANUS-DIPHTH-ACELL PERTUSSIS 5-2.5-18.5 LF-MCG/0.5 IM SUSP
0.5000 mL | Freq: Once | INTRAMUSCULAR | Status: AC
  Administered 2014-09-02: 0.5 mL via INTRAMUSCULAR

## 2014-09-02 NOTE — Progress Notes (Signed)
Subjective:  Jasmine [name] is a 24 y.o. G3P2002 at [redacted]w[redacted]d being seen today for ongoing prenatal care.  Patient reports no complaints.  Contractions: Not present.  Vag. Bleeding: None. Movement: Present. Denies leaking of fluid.   The following portions of the patient's history were reviewed and updated as appropriate: allergies, current medications, past family history, past medical history, past social history, past surgical history and problem list.   Objective:   Filed Vitals:   09/02/14 1119  BP: 106/64  Pulse: 92  Temp: 98.4 F (36.9 C)  Weight: 171 lb 9 oz (77.82 kg)    Fetal Status: Fetal Heart Rate (bpm): 155   Movement: Present     General:  Alert, oriented and cooperative. Patient is in no acute distress.  Skin: Skin is warm and dry. No rash noted.   Cardiovascular: Normal heart rate noted  Respiratory: Normal respiratory effort, no problems with respiration noted  Abdomen: Soft, gravid, appropriate for gestational age. Pain/Pressure: Present     Pelvic: Vag. Bleeding: None     Cervical exam deferred        Extremities: Normal range of motion.  Edema: None  Mental Status: Normal mood and affect. Normal behavior. Normal judgment and thought content.   Urinalysis: Urine Protein: Negative Urine Glucose: Negative  Assessment and Plan:  Pregnancy: G3P2002 at [redacted]w[redacted]d  1. Supervision of high risk pregnancy, antepartum, second trimester 28 week labs.  FHT, FH normal.  2. Asthma complicating pregnancy, antepartum  - Glucose Tolerance, 1 HR (50g) w/o Fasting - CBC - RPR - HIV antibody (with reflex) - Tdap (BOOSTRIX) injection 0.5 mL; Inject 0.5 mLs into the muscle once.  Preterm labor symptoms and general obstetric precautions including but not limited to vaginal bleeding, contractions, leaking of fluid and fetal movement were reviewed in detail with the patient. Please refer to After Visit Summary for other counseling recommendations.  No Follow-up on file.   Truett Mainland, DO

## 2014-09-02 NOTE — Patient Instructions (Signed)
Third Trimester of Pregnancy The third trimester is from week 29 through week 42, months 7 through 9. The third trimester is a time when the fetus is growing rapidly. At the end of the ninth month, the fetus is about 20 inches in length and weighs 6-10 pounds.  BODY CHANGES Your body goes through many changes during pregnancy. The changes vary from woman to woman.   Your weight will continue to increase. You can expect to gain 25-35 pounds (11-16 kg) by the end of the pregnancy.  You may begin to get stretch marks on your hips, abdomen, and breasts.  You may urinate more often because the fetus is moving lower into your pelvis and pressing on your bladder.  You may develop or continue to have heartburn as a result of your pregnancy.  You may develop constipation because certain hormones are causing the muscles that push waste through your intestines to slow down.  You may develop hemorrhoids or swollen, bulging veins (varicose veins).  You may have pelvic pain because of the weight gain and pregnancy hormones relaxing your joints between the bones in your pelvis. Backaches may result from overexertion of the muscles supporting your posture.  You may have changes in your hair. These can include thickening of your hair, rapid growth, and changes in texture. Some women also have hair loss during or after pregnancy, or hair that feels dry or thin. Your hair will most likely return to normal after your baby is born.  Your breasts will continue to grow and be tender. A yellow discharge may leak from your breasts called colostrum.  Your belly button may stick out.  You may feel short of breath because of your expanding uterus.  You may notice the fetus "dropping," or moving lower in your abdomen.  You may have a bloody mucus discharge. This usually occurs a few days to a week before labor begins.  Your cervix becomes thin and soft (effaced) near your due date. WHAT TO EXPECT AT YOUR PRENATAL  EXAMS  You will have prenatal exams every 2 weeks until week 36. Then, you will have weekly prenatal exams. During a routine prenatal visit:  You will be weighed to make sure you and the fetus are growing normally.  Your blood pressure is taken.  Your abdomen will be measured to track your baby's growth.  The fetal heartbeat will be listened to.  Any test results from the previous visit will be discussed.  You may have a cervical check near your due date to see if you have effaced. At around 36 weeks, your caregiver will check your cervix. At the same time, your caregiver will also perform a test on the secretions of the vaginal tissue. This test is to determine if a type of bacteria, Group B streptococcus, is present. Your caregiver will explain this further. Your caregiver may ask you:  What your birth plan is.  How you are feeling.  If you are feeling the baby move.  If you have had any abnormal symptoms, such as leaking fluid, bleeding, severe headaches, or abdominal cramping.  If you have any questions. Other tests or screenings that may be performed during your third trimester include:  Blood tests that check for low iron levels (anemia).  Fetal testing to check the health, activity level, and growth of the fetus. Testing is done if you have certain medical conditions or if there are problems during the pregnancy. FALSE LABOR You may feel small, irregular contractions that   eventually go away. These are called Braxton Hicks contractions, or false labor. Contractions may last for hours, days, or even weeks before true labor sets in. If contractions come at regular intervals, intensify, or become painful, it is best to be seen by your caregiver.  SIGNS OF LABOR   Menstrual-like cramps.  Contractions that are 5 minutes apart or less.  Contractions that start on the top of the uterus and spread down to the lower abdomen and back.  A sense of increased pelvic pressure or back  pain.  A watery or bloody mucus discharge that comes from the vagina. If you have any of these signs before the 37th week of pregnancy, call your caregiver right away. You need to go to the hospital to get checked immediately. HOME CARE INSTRUCTIONS   Avoid all smoking, herbs, alcohol, and unprescribed drugs. These chemicals affect the formation and growth of the baby.  Follow your caregiver's instructions regarding medicine use. There are medicines that are either safe or unsafe to take during pregnancy.  Exercise only as directed by your caregiver. Experiencing uterine cramps is a good sign to stop exercising.  Continue to eat regular, healthy meals.  Wear a good support bra for breast tenderness.  Do not use hot tubs, steam rooms, or saunas.  Wear your seat belt at all times when driving.  Avoid raw meat, uncooked cheese, cat litter boxes, and soil used by cats. These carry germs that can cause birth defects in the baby.  Take your prenatal vitamins.  Try taking a stool softener (if your caregiver approves) if you develop constipation. Eat more high-fiber foods, such as fresh vegetables or fruit and whole grains. Drink plenty of fluids to keep your urine clear or pale yellow.  Take warm sitz baths to soothe any pain or discomfort caused by hemorrhoids. Use hemorrhoid cream if your caregiver approves.  If you develop varicose veins, wear support hose. Elevate your feet for 15 minutes, 3-4 times a day. Limit salt in your diet.  Avoid heavy lifting, wear low heal shoes, and practice good posture.  Rest a lot with your legs elevated if you have leg cramps or low back pain.  Visit your dentist if you have not gone during your pregnancy. Use a soft toothbrush to brush your teeth and be gentle when you floss.  A sexual relationship may be continued unless your caregiver directs you otherwise.  Do not travel far distances unless it is absolutely necessary and only with the approval  of your caregiver.  Take prenatal classes to understand, practice, and ask questions about the labor and delivery.  Make a trial run to the hospital.  Pack your hospital bag.  Prepare the baby's nursery.  Continue to go to all your prenatal visits as directed by your caregiver. SEEK MEDICAL CARE IF:  You are unsure if you are in labor or if your water has broken.  You have dizziness.  You have mild pelvic cramps, pelvic pressure, or nagging pain in your abdominal area.  You have persistent nausea, vomiting, or diarrhea.  You have a bad smelling vaginal discharge.  You have pain with urination. SEEK IMMEDIATE MEDICAL CARE IF:   You have a fever.  You are leaking fluid from your vagina.  You have spotting or bleeding from your vagina.  You have severe abdominal cramping or pain.  You have rapid weight loss or gain.  You have shortness of breath with chest pain.  You notice sudden or extreme swelling   of your face, hands, ankles, feet, or legs.  You have not felt your baby move in over an hour.  You have severe headaches that do not go away with medicine.  You have vision changes. Document Released: 12/26/2000 Document Revised: 01/06/2013 Document Reviewed: 03/04/2012 [employer] Patient Information 2015 [employer]. This information is not intended to replace advice given to you by your health care provider. Make sure you discuss any questions you have with your health care provider.  

## 2014-09-02 NOTE — Progress Notes (Signed)
Reviewed Breast Feeding tip of the week with patient.

## 2014-09-03 LAB — RPR

## 2014-09-03 LAB — GLUCOSE TOLERANCE, 1 HOUR (50G) W/O FASTING: Glucose, 1 Hour GTT: 95 mg/dL (ref 70–140)

## 2014-09-03 LAB — HIV ANTIBODY (ROUTINE TESTING W REFLEX): HIV 1&2 Ab, 4th Generation: NONREACTIVE

## 2014-09-07 ENCOUNTER — Encounter: Payer: Self-pay | Admitting: Emergency Medicine

## 2014-09-07 ENCOUNTER — Ambulatory Visit: Payer: Medicaid Other | Admitting: Emergency Medicine

## 2014-09-07 ENCOUNTER — Ambulatory Visit (INDEPENDENT_AMBULATORY_CARE_PROVIDER_SITE_OTHER): Payer: Medicaid Other | Admitting: Emergency Medicine

## 2014-09-07 VITALS — BP 130/78 | HR 104 | Ht 64.0 in | Wt 174.0 lb

## 2014-09-07 DIAGNOSIS — O9989 Other specified diseases and conditions complicating pregnancy, childbirth and the puerperium: Secondary | ICD-10-CM

## 2014-09-07 DIAGNOSIS — O99519 Diseases of the respiratory system complicating pregnancy, unspecified trimester: Principal | ICD-10-CM

## 2014-09-07 DIAGNOSIS — J45909 Unspecified asthma, uncomplicated: Secondary | ICD-10-CM

## 2014-09-07 NOTE — Patient Instructions (Signed)
Please continue Advair twice a day Please continue albuterol as needed for shortness of breath, cough, wheezing Follow with Dr. Lamonte Sakai in 6 weeks After your delivery we will get back together and consider repeating your pulmonary function testing, assessing her current medicines to see if you might benefit from a change

## 2014-09-07 NOTE — Progress Notes (Signed)
   Subjective:    Patient ID: Jasmine [name], female    DOB: August 29, 1990, 24 y.o.   MRN: [id number]  HPI 24 year old female former smoker with asthma Patient is [redacted] weeks pregnant. She does have a seizure disorder and is on Keppra.  [facility] follow up 07/27/14 Patient presents for a post hospital follow-up Patient was admitted June 18 to June 21 for asthma exacerbation. She says prior to admission. She been exposed to extreme heat at work. Has shortness of breath, wheezing and tightness. She was treated with IV steroids and  broncho-dilators. She was discharged on Medrol Dosepak. Restarted on Advair and Singulair. Since discharge. Patient says that she has been improved with decreased cough, shortness of breath and wheezing. She denies any increased albuterol use. She denies any abdominal pain, contractions, swelling, seizure activity, fever, or discolored mucus. Patient has 2 children at home, ages 58 and 85. She quit smoking in March of this year. He denies any drug use. Works at [employer].  ROV 09/07/14 -- follow-up visit for history of tobacco use and asthma. She has been intubated in the past, has had freq hospitalizations in the past. She was hospitalized in June for an acute exacerbation in the setting of pregnancy.  She has been doing fairly well but does still have paroxysms of cough, associated with some wheeze. Occasionally has exertional dyspnea especially with a long walk.  Still able to work. Has not required any extra meds since last visit here. On Advair, albuterol prn (about 2x a day).     Review of Systems As per HPI     Objective:   Physical Exam Filed Vitals:   09/07/14 1205  BP: 130/78  Pulse: 104  Height: 5\' 4"  (1.626 m)  Weight: 174 lb (78.926 kg)  SpO2: 96%   Gen: Pleasant, well-nourished, in no distress,  normal affect, [redacted] weeks pregnant  ENT: No lesions,  mouth clear,  oropharynx clear, no postnasal drip  Neck: No JVD, no Stridor  Lungs: No use of  accessory muscles, clear without rales or rhonchi  Cardiovascular: RRR, heart sounds normal, no murmur or gallops, no peripheral edema  Musculoskeletal: No deformities, no cyanosis or clubbing  Neuro: alert, non focal  Skin: Warm, no lesions or rash      Assessment & Plan:  Asthma complicating pregnancy, antepartum She appears to be symptomatic with more frequent albuterol use that I would like during the pregnancy. For now I will continue Advair, Singulair and albuterol as needed. I'm hopeful that once she delivers her daily symptoms will decrease in frequency. At that time we should repeat her pulmonary function testing and reassess her medications. It may possible for [place] to change to an inhaled corticosteroid alone. Is also possible that if most of her symptoms or cough that she would benefit from an alternative ICS/LABA to Advair

## 2014-09-07 NOTE — Assessment & Plan Note (Signed)
She appears to be symptomatic with more frequent albuterol use that I would like during the pregnancy. For now I will continue Advair, Singulair and albuterol as needed. I'm hopeful that once she delivers her daily symptoms will decrease in frequency. At that time we should repeat her pulmonary function testing and reassess her medications. It may possible for [place] to change to an inhaled corticosteroid alone. Is also possible that if most of her symptoms or cough that she would benefit from an alternative ICS/LABA to Advair

## 2014-09-09 ENCOUNTER — Encounter (HOSPITAL_COMMUNITY): Payer: Self-pay

## 2014-09-09 ENCOUNTER — Ambulatory Visit (HOSPITAL_COMMUNITY)
Admission: RE | Admit: 2014-09-09 | Discharge: 2014-09-09 | Disposition: A | Discharge status: Home / Self Care | Payer: Medicaid Other | Source: Ambulatory Visit | Attending: Family Medicine | Admitting: Family Medicine

## 2014-09-09 DIAGNOSIS — G40909 Epilepsy, unspecified, not intractable, without status epilepticus: Secondary | ICD-10-CM | POA: Insufficient documentation

## 2014-09-09 DIAGNOSIS — O99519 Diseases of the respiratory system complicating pregnancy, unspecified trimester: Secondary | ICD-10-CM

## 2014-09-09 DIAGNOSIS — O283 Abnormal ultrasonic finding on antenatal screening of mother: Secondary | ICD-10-CM | POA: Diagnosis not present

## 2014-09-09 DIAGNOSIS — Z3A30 30 weeks gestation of pregnancy: Secondary | ICD-10-CM

## 2014-09-09 DIAGNOSIS — O281 Abnormal biochemical finding on antenatal screening of mother: Secondary | ICD-10-CM | POA: Insufficient documentation

## 2014-09-09 DIAGNOSIS — O34219 Maternal care for unspecified type scar from previous cesarean delivery: Secondary | ICD-10-CM

## 2014-09-09 DIAGNOSIS — O3421 Maternal care for scar from previous cesarean delivery: Secondary | ICD-10-CM | POA: Insufficient documentation

## 2014-09-09 DIAGNOSIS — O99353 Diseases of the nervous system complicating pregnancy, third trimester: Secondary | ICD-10-CM | POA: Insufficient documentation

## 2014-09-09 DIAGNOSIS — J45909 Unspecified asthma, uncomplicated: Secondary | ICD-10-CM

## 2014-09-16 ENCOUNTER — Ambulatory Visit (INDEPENDENT_AMBULATORY_CARE_PROVIDER_SITE_OTHER): Payer: Medicaid Other | Admitting: Family Medicine

## 2014-09-16 VITALS — BP 128/64 | HR 74 | Temp 98.6°F | Wt 171.9 lb

## 2014-09-16 DIAGNOSIS — O3421 Maternal care for scar from previous cesarean delivery: Secondary | ICD-10-CM

## 2014-09-16 DIAGNOSIS — Z91018 Allergy to other foods: Secondary | ICD-10-CM

## 2014-09-16 DIAGNOSIS — J45909 Unspecified asthma, uncomplicated: Secondary | ICD-10-CM | POA: Diagnosis not present

## 2014-09-16 DIAGNOSIS — O289 Unspecified abnormal findings on antenatal screening of mother: Secondary | ICD-10-CM

## 2014-09-16 DIAGNOSIS — O0992 Supervision of high risk pregnancy, unspecified, second trimester: Secondary | ICD-10-CM | POA: Diagnosis not present

## 2014-09-16 DIAGNOSIS — O9989 Other specified diseases and conditions complicating pregnancy, childbirth and the puerperium: Secondary | ICD-10-CM

## 2014-09-16 DIAGNOSIS — O34219 Maternal care for unspecified type scar from previous cesarean delivery: Secondary | ICD-10-CM

## 2014-09-16 DIAGNOSIS — G40909 Epilepsy, unspecified, not intractable, without status epilepticus: Secondary | ICD-10-CM

## 2014-09-16 DIAGNOSIS — O99352 Diseases of the nervous system complicating pregnancy, second trimester: Secondary | ICD-10-CM | POA: Diagnosis not present

## 2014-09-16 DIAGNOSIS — O99519 Diseases of the respiratory system complicating pregnancy, unspecified trimester: Principal | ICD-10-CM

## 2014-09-16 DIAGNOSIS — O280 Abnormal hematological finding on antenatal screening of mother: Secondary | ICD-10-CM

## 2014-09-16 LAB — POCT URINALYSIS DIP (DEVICE)
Glucose, UA: NEGATIVE mg/dL
HGB URINE DIPSTICK: NEGATIVE
KETONES UR: NEGATIVE mg/dL
Leukocytes, UA: NEGATIVE
Nitrite: NEGATIVE
PH: 7 (ref 5.0–8.0)
Protein, ur: NEGATIVE mg/dL
SPECIFIC GRAVITY, URINE: 1.02 (ref 1.005–1.030)
Urobilinogen, UA: 1 mg/dL (ref 0.0–1.0)

## 2014-09-16 NOTE — Progress Notes (Signed)
Educated pt on Benefits of Breastfeeding for baby and info given

## 2014-09-16 NOTE — Progress Notes (Signed)
Subjective:  Jasmine [name] is a 24 y.o. G3P2002 at [redacted]w[redacted]d being seen today for ongoing prenatal care.  Patient reports no complaints.  Contractions: Not present.  Vag. Bleeding: None. Movement: Present. Denies leaking of fluid.   Reports lower abdominal pain/pelvic pressure  The following portions of the patient's history were reviewed and updated as appropriate: allergies, current medications, past family history, past medical history, past social history, past surgical history and problem list.   Objective:   Filed Vitals:   09/16/14 1150  BP: 128/64  Pulse: 74  Temp: 98.6 F (37 C)  Weight: 171 lb 14.4 oz (77.973 kg)    Fetal Status: Fetal Heart Rate (bpm): 143   Movement: Present     General:  Alert, oriented and cooperative. Patient is in no acute distress.  Skin: Skin is warm and dry. No rash noted.   Cardiovascular: Normal heart rate noted  Respiratory: Normal respiratory effort, no problems with respiration noted  Abdomen: Soft, gravid, appropriate for gestational age. Pain/Pressure: Absent     Pelvic: Vag. Bleeding: None     Cervical exam deferred        Extremities: Normal range of motion.  Edema: None  Mental Status: Normal mood and affect. Normal behavior. Normal judgment and thought content.   Urinalysis: Urine Protein: Negative Urine Glucose: Negative  Assessment and Plan:  Pregnancy: G3P2002 at [redacted]w[redacted]d  1. Asthma complicating pregnancy, antepartum Moderate persistent. Stable  - refilled albuterol today  2. Seizure disorder during pregnancy, antepartum, second trimester - taking meds, no sz since 2015  3. Supervision of high risk pregnancy, antepartum, second trimester - Updated pregnancy box - lower abdominal pain, likely pelvic pressure for infant descent into pelvis.  - Discussed TOLAC. She would like planned CS at 39 wks but would like to [facility] if she goes into spontaneous labor.  We reviewed risks but patient signed form incorrectly- (checked repeat CS).  She will need to sign this again.   4. History of food anaphylaxis See allergies. PCN allergic and will need sensitivities if GBS pos  5. Abnormal quad screen Low risk NIPS  6. Previous cesarean section complicating pregnancy, antepartum condition or complication Good candidate for TOLAC, indication for CS ws frank breech. VBAC success 80.7%  Preterm labor symptoms and general obstetric precautions including but not limited to vaginal bleeding, contractions, leaking of fluid and fetal movement were reviewed in detail with the patient. Please refer to After Visit Summary for other counseling recommendations.  Return in about 2 weeks (around 09/30/2014) for Routine prenatal care.   Caren Macadam, MD

## 2014-09-16 NOTE — Patient Instructions (Signed)
Vaginal Birth After Cesarean Delivery Vaginal birth after cesarean delivery (VBAC) is giving birth vaginally after previously delivering a baby by a cesarean. In the past, if a woman had a cesarean delivery, all births afterward would be done by cesarean delivery. This is no longer true. It can be safe for the mother to try a vaginal delivery after having a cesarean delivery.  It is important to discuss VBAC with your health care provider early in the pregnancy so you can understand the risks, benefits, and options. It will give you time to decide what is best in your particular case. The final decision about whether to have a VBAC or repeat cesarean delivery should be between you and your health care provider. Any changes in your health or your baby's health during your pregnancy may make it necessary to change your initial decision about VBAC.  WOMEN WHO PLAN TO HAVE A VBAC SHOULD CHECK WITH THEIR HEALTH CARE PROVIDER TO BE SURE THAT:  The previous cesarean delivery was done with a low transverse uterine cut (incision) (not a vertical classical incision).   The birth canal is big enough for the baby.   There were no other operations on the uterus.   An electronic fetal monitor (EFM) will be on at all times during labor.   An operating room will be available and ready in case an emergency cesarean delivery is needed.   A health care provider and surgical nursing staff will be available at all times during labor to be ready to do an emergency delivery cesarean if necessary.   An anesthesiologist will be present in case an emergency cesarean delivery is needed.   The nursery is prepared and has adequate personnel and necessary equipment available to care for the baby in case of an emergency cesarean delivery. BENEFITS OF VBAC  Shorter stay in the hospital.   Avoidance of risks associated with cesarean delivery, such as:  Surgical complications, such as opening of the incision or  hernia in the incision.  Injury to other organs.  Fever. This can occur if an infection develops after surgery. It can also occur as a reaction to the medicine given to make you numb during the surgery.  Less blood loss and need for blood transfusions.  Lower risk of blood clots and infection.  Shorter recovery.   Decreased risk for having to remove the uterus (hysterectomy).   Decreased risk for the placenta to completely or partially cover the opening of the uterus (placenta previa) with a future pregnancy.   Decrease risk in future labor and delivery. RISKS OF A VBAC  Tearing (rupture) of the uterus. This is occurs in less than 1% of VBACs. The risk of this happening is higher if:  Steps are taken to begin the labor process (induce labor) or stimulate or strengthen contractions (augment labor).   Medicine is used to soften (ripen) the cervix.  Having to remove the uterus (hysterectomy) if it ruptures. VBAC SHOULD NOT BE DONE IF:  The previous cesarean delivery was done with a vertical (classical) or T-shaped incision or you do not know what kind of incision was made.   You had a ruptured uterus.   You have had certain types of surgery on your uterus, such as removal of uterine fibroids. Ask your health care provider about other types of surgeries that prevent you from having a VBAC.  You have certain medical or childbirth (obstetrical) problems.   There are problems with the baby.   You  have had two previous cesarean deliveries and no vaginal deliveries. OTHER FACTS TO KNOW ABOUT VBAC:  It is safe to have an epidural anesthetic with VBAC.   It is safe to turn the baby from a breech position (attempt an external cephalic version).   It is safe to try a VBAC with twins.   VBAC may not be successful if your baby weights 8.8 lb (4 kg) or more. However, weight predictions are not always accurate and should not be used alone to decide if VBAC is right for  you.  There is an increased failure rate if the time between the cesarean delivery and VBAC is less than 19 months.   Your health care provider may advise against a VBAC if you have preeclampsia (high blood pressure, protein in the urine, and swelling of face and extremities).   VBAC is often successful if you previously gave birth vaginally.   VBAC is often successful when the labor starts spontaneously before the due date.   Delivering a baby through a VBAC is similar to having a normal spontaneous vaginal delivery. Document Released: 06/24/2006 Document Revised: 05/18/2013 Document Reviewed: 07/31/2012 [facility] Patient Information 2015 [place], [place]. This information is not intended to replace advice given to you by your health care provider. Make sure you discuss any questions you have with your health care provider.

## 2014-09-30 ENCOUNTER — Ambulatory Visit (INDEPENDENT_AMBULATORY_CARE_PROVIDER_SITE_OTHER): Payer: Medicaid Other | Admitting: Obstetrics & Gynecology

## 2014-09-30 ENCOUNTER — Encounter (HOSPITAL_COMMUNITY): Payer: Self-pay | Admitting: *Deleted

## 2014-09-30 ENCOUNTER — Other Ambulatory Visit: Payer: Self-pay | Admitting: Family Medicine

## 2014-09-30 VITALS — BP 118/64 | HR 75 | Wt 173.7 lb

## 2014-09-30 DIAGNOSIS — O3421 Maternal care for scar from previous cesarean delivery: Secondary | ICD-10-CM

## 2014-09-30 DIAGNOSIS — O26899 Other specified pregnancy related conditions, unspecified trimester: Secondary | ICD-10-CM

## 2014-09-30 DIAGNOSIS — O34219 Maternal care for unspecified type scar from previous cesarean delivery: Secondary | ICD-10-CM

## 2014-09-30 DIAGNOSIS — R102 Pelvic and perineal pain: Secondary | ICD-10-CM

## 2014-09-30 LAB — POCT URINALYSIS DIP (DEVICE)
GLUCOSE, UA: NEGATIVE mg/dL
Hgb urine dipstick: NEGATIVE
Ketones, ur: NEGATIVE mg/dL
LEUKOCYTES UA: NEGATIVE
NITRITE: NEGATIVE
PROTEIN: NEGATIVE mg/dL
SPECIFIC GRAVITY, URINE: 1.02 (ref 1.005–1.030)
UROBILINOGEN UA: 2 mg/dL — AB (ref 0.0–1.0)
pH: 7.5 (ref 5.0–8.0)

## 2014-09-30 MED ORDER — DIPHENHYDRAMINE HCL 50 MG PO TABS: 50.0000 mg | ORAL_TABLET | Freq: Every evening | ORAL | Status: DC | PRN

## 2014-09-30 MED ORDER — CYCLOBENZAPRINE HCL 10 MG PO TABS: 10.0000 mg | ORAL_TABLET | Freq: Three times a day (TID) | ORAL | Status: DC | PRN

## 2014-09-30 NOTE — Progress Notes (Signed)
Subjective:  Jasmine [name] is a 24 y.o. G3P2002 at [redacted]w[redacted]d being seen today for ongoing prenatal care.  Patient reports pelvic cramping.  Contractions: Not present.  Vag. Bleeding: None. Movement: Present. Denies leaking of fluid.   The following portions of the patient's history were reviewed and updated as appropriate: allergies, current medications, past family history, past medical history, past social history, past surgical history and problem list.   Objective:   Filed Vitals:   09/30/14 1134  BP: 118/64  Pulse: 75  Weight: 173 lb 11.2 oz (78.79 kg)    Fetal Status: Fetal Heart Rate (bpm): 145 Fundal Height: 34 cm Movement: Present     General:  Alert, oriented and cooperative. Patient is in no acute distress.  Skin: Skin is warm and dry. No rash noted.   Cardiovascular: Normal heart rate noted  Respiratory: Normal respiratory effort, no problems with respiration noted  Abdomen: Soft, gravid, appropriate for gestational age. Pain/Pressure: Present     Pelvic: Vag. Bleeding: None     Cervical exam deferred        Extremities: Normal range of motion.  Edema: None  Mental Status: Normal mood and affect. Normal behavior. Normal judgment and thought content.   Urinalysis: Urine Protein: Negative Urine Glucose: Negative  Assessment and Plan:  Pregnancy: G3P2002 at [redacted]w[redacted]d  1. Previous cesarean section complicating pregnancy, antepartum condition or complication I sent in order for scheduling RCS at 39 weeks; 11/08/14 as per patient preference.  2. Pelvic pain complicating pregnancy, antepartum with related insomnia - cyclobenzaprine (FLEXERIL) 10 MG tablet; Take 1 tablet (10 mg total) by mouth every 8 (eight) hours as needed for muscle spasms.  Dispense: 30 tablet; Refill: 5 - diphenhydrAMINE (BENADRYL) 50 MG tablet; Take 1 tablet (50 mg total) by mouth at bedtime as needed for itching.  Dispense: 30 tablet; Refill: 6  Preterm labor symptoms and general obstetric precautions  including but not limited to vaginal bleeding, contractions, leaking of fluid and fetal movement were reviewed in detail with the patient. Please refer to After Visit Summary for other counseling recommendations.  Return in about 2 weeks (around 10/14/2014) for OB Visit.   Osborne Oman, MD

## 2014-09-30 NOTE — Patient Instructions (Signed)
Return to clinic for any obstetric concerns or go to [facility] for evaluation  

## 2014-10-02 ENCOUNTER — Encounter (HOSPITAL_COMMUNITY): Payer: Self-pay | Admitting: Emergency Medicine

## 2014-10-02 ENCOUNTER — Emergency Department (HOSPITAL_COMMUNITY)
Admission: EM | Admit: 2014-10-02 | Discharge: 2014-10-02 | Disposition: A | Discharge status: Home / Self Care | Payer: Medicaid Other | Attending: Emergency Medicine | Admitting: Emergency Medicine

## 2014-10-02 DIAGNOSIS — O99513 Diseases of the respiratory system complicating pregnancy, third trimester: Secondary | ICD-10-CM | POA: Diagnosis not present

## 2014-10-02 DIAGNOSIS — Z79899 Other long term (current) drug therapy: Secondary | ICD-10-CM | POA: Diagnosis not present

## 2014-10-02 DIAGNOSIS — Z7951 Long term (current) use of inhaled steroids: Secondary | ICD-10-CM | POA: Diagnosis not present

## 2014-10-02 DIAGNOSIS — G40909 Epilepsy, unspecified, not intractable, without status epilepticus: Secondary | ICD-10-CM | POA: Insufficient documentation

## 2014-10-02 DIAGNOSIS — O9989 Other specified diseases and conditions complicating pregnancy, childbirth and the puerperium: Secondary | ICD-10-CM | POA: Insufficient documentation

## 2014-10-02 DIAGNOSIS — Z8719 Personal history of other diseases of the digestive system: Secondary | ICD-10-CM | POA: Diagnosis not present

## 2014-10-02 DIAGNOSIS — Z3A34 34 weeks gestation of pregnancy: Secondary | ICD-10-CM | POA: Insufficient documentation

## 2014-10-02 DIAGNOSIS — Z87891 Personal history of nicotine dependence: Secondary | ICD-10-CM | POA: Diagnosis not present

## 2014-10-02 DIAGNOSIS — J4531 Mild persistent asthma with (acute) exacerbation: Secondary | ICD-10-CM | POA: Insufficient documentation

## 2014-10-02 DIAGNOSIS — O99353 Diseases of the nervous system complicating pregnancy, third trimester: Secondary | ICD-10-CM | POA: Diagnosis not present

## 2014-10-02 DIAGNOSIS — R011 Cardiac murmur, unspecified: Secondary | ICD-10-CM | POA: Diagnosis not present

## 2014-10-02 LAB — BLOOD GAS, ARTERIAL
ACID-BASE DEFICIT: 4.2 mmol/L — AB (ref 0.0–2.0)
Bicarbonate: 17.9 mEq/L — ABNORMAL LOW (ref 20.0–24.0)
DRAWN BY: 103701
FIO2: 0.21
O2 Saturation: 98.9 %
PATIENT TEMPERATURE: 98.6
PCO2 ART: 25.1 mmHg — AB (ref 35.0–45.0)
TCO2: 16.1 mmol/L (ref 0–100)
pH, Arterial: 7.466 — ABNORMAL HIGH (ref 7.350–7.450)
pO2, Arterial: 125 mmHg — ABNORMAL HIGH (ref 80.0–100.0)

## 2014-10-02 LAB — CBC WITH DIFFERENTIAL/PLATELET
BASOS PCT: 0 %
Basophils Absolute: 0 10*3/uL (ref 0.0–0.1)
EOS ABS: 0.2 10*3/uL (ref 0.0–0.7)
Eosinophils Relative: 1 %
HCT: 35.8 % — ABNORMAL LOW (ref 36.0–46.0)
HEMOGLOBIN: 12.2 g/dL (ref 12.0–15.0)
Lymphocytes Relative: 24 %
Lymphs Abs: 3.5 10*3/uL (ref 0.7–4.0)
MCH: 29 pg (ref 26.0–34.0)
MCHC: 34.1 g/dL (ref 30.0–36.0)
MCV: 85.2 fL (ref 78.0–100.0)
Monocytes Absolute: 0.6 10*3/uL (ref 0.1–1.0)
Monocytes Relative: 4 %
NEUTROS ABS: 10.6 10*3/uL — AB (ref 1.7–7.7)
NEUTROS PCT: 71 %
Platelets: 192 10*3/uL (ref 150–400)
RBC: 4.2 MIL/uL (ref 3.87–5.11)
RDW: 13.4 % (ref 11.5–15.5)
WBC: 15 10*3/uL — AB (ref 4.0–10.5)

## 2014-10-02 LAB — BASIC METABOLIC PANEL
ANION GAP: 7 (ref 5–15)
BUN: <5 mg/dL — ABNORMAL LOW (ref 6–20)
CHLORIDE: 105 mmol/L (ref 101–111)
CO2: 22 mmol/L (ref 22–32)
Calcium: 8.8 mg/dL — ABNORMAL LOW (ref 8.9–10.3)
Creatinine, Ser: 0.39 mg/dL — ABNORMAL LOW (ref 0.44–1.00)
GFR calc non Af Amer: >60 mL/min (ref >60)
Glucose, Bld: 86 mg/dL (ref 65–99)
POTASSIUM: 3.1 mmol/L — AB (ref 3.5–5.1)
SODIUM: 134 mmol/L — AB (ref 135–145)

## 2014-10-02 MED ORDER — METHYLPREDNISOLONE 4 MG PO TBPK: ORAL_TABLET | ORAL | Status: DC

## 2014-10-02 MED ORDER — METHYLPREDNISOLONE SODIUM SUCC 125 MG IJ SOLR
125.0000 mg | Freq: Once | INTRAMUSCULAR | Status: AC
  Administered 2014-10-02: 125 mg via INTRAVENOUS
  Filled 2014-10-02: qty 2

## 2014-10-02 MED ORDER — ALBUTEROL (5 MG/ML) CONTINUOUS INHALATION SOLN
10.0000 mg/h | INHALATION_SOLUTION | Freq: Once | RESPIRATORY_TRACT | Status: AC
  Administered 2014-10-02: 10 mg/h via RESPIRATORY_TRACT
  Filled 2014-10-02: qty 20

## 2014-10-02 MED ORDER — IPRATROPIUM-ALBUTEROL 0.5-2.5 (3) MG/3ML IN SOLN
3.0000 mL | Freq: Once | RESPIRATORY_TRACT | Status: AC
  Administered 2014-10-02: 3 mL via RESPIRATORY_TRACT
  Filled 2014-10-02: qty 3

## 2014-10-02 NOTE — ED Notes (Signed)
Bed: RP59 Expected date:  Expected time:  Means of arrival:  Comments: Hold for triage 7

## 2014-10-02 NOTE — Discharge Instructions (Signed)

## 2014-10-02 NOTE — ED Notes (Signed)
Respiratory called

## 2014-10-02 NOTE — ED Provider Notes (Signed)
CSN: [id number]     Arrival date & time 10/02/14  1447 History  This chart was scribed for non-physician practitioner, Hyman Bible, PA-C, working with Orpah Greek, MD by Evelene Croon, ED Scribe. This patient was seen in room WTR7/WTR7 and the patient's care was started at 3:37 PM.    Chief Complaint  Patient presents with  . Asthma  . 8 months Pregnant    The history is provided by the patient. No language interpreter was used.     HPI Comments:  Jasmine [name] is a 24 y.o. female who is 8 months pregnant with a history of asthma, who presents to the Emergency Department complaining of SOB, wheezing, and chest tightness. Symptoms present for the past 2-3 days and gradually worsening.  She has been hospitalized and intubated for her asthma in the past.  Pt states her symptoms feel like past asthma exacerbations.  Ptused an at home Albuterol nebulizer without relief.  She denies fever, chills, nausea, or vomiting.  She also denies urinary symptoms, vaginal bleeding, leakage of fluid, abdominal pain, pelvic pain, or contractions.   Past Medical History  Diagnosis Date  . Asthma   . Seizures   . Sickle cell trait   . Pancreatitis   . Heart murmur   . GERD (gastroesophageal reflux disease)    Past Surgical History  Procedure Laterality Date  . Nerve, tendon and artery repair Right 09/23/2012    Procedure: I&D and Repair As Necessary/Right Hand and Palm;  Surgeon: Roseanne Kaufman, MD;  Location: [facility];  Service: Orthopedics;  Laterality: Right;  . Tonsillectomy    . Hernia repair    . Cesarean section    . Hand surgery    . Appendectomy     Family History  Problem Relation Age of Onset  . Cancer Mother     cervical cancer  . Asthma Mother   . Hypertension Father   . Sickle cell anemia Father   . Asthma Sister   . Diabetes Maternal Aunt   . Cancer Maternal Grandmother    Social History  Substance Use Topics  . Smoking status: Former Smoker    Types: Cigarettes     Quit date: 03/27/2014  . Smokeless tobacco: Never Used  . Alcohol Use: No   OB History    Gravida Para Term Preterm AB TAB SAB Ectopic Multiple Living   3 2 2       2      Review of Systems  Constitutional: Negative for fever and chills.  Respiratory: Positive for chest tightness, shortness of breath and wheezing.   All other systems reviewed and are negative.     Allergies  Avocado; Cheese; Chocolate; Ibuprofen; Orange juice; Other; Peach; Peanuts; Pear; Prednisone; Raspberry; Tylenol; Amoxicillin; Doxycycline; Erythromycin; Ivp dye; Latex; Milk of magnesia; and Penicillins  Home Medications   Prior to Admission medications   Medication Sig Start Date End Date Taking? Authorizing Deysy Schabel  albuterol (PROVENTIL HFA;VENTOLIN HFA) 108 (90 BASE) MCG/ACT inhaler Inhale 2 puffs into the lungs every 4 (four) hours as needed for wheezing or shortness of breath. 07/06/14   Jonetta Osgood, MD  albuterol (PROVENTIL) (2.5 MG/3ML) 0.083% nebulizer solution Take 3 mLs (2.5 mg total) by nebulization every 4 (four) hours as needed for wheezing or shortness of breath. 07/06/14   Shanker Kristeen Mans, MD  cyclobenzaprine (FLEXERIL) 10 MG tablet Take 1 tablet (10 mg total) by mouth every 8 (eight) hours as needed for muscle spasms. 09/30/14   Laray Anger  A Anyanwu, MD  diphenhydrAMINE (BENADRYL) 50 MG tablet Take 1 tablet (50 mg total) by mouth at bedtime as needed for itching. 09/30/14   Osborne Oman, MD  EPINEPHrine (ADRENALIN) 1 MG/ML injection Inject 1 mL (1 mg total) into the muscle once. Patient not taking: Reported on 09/07/2014 06/10/14   Nila Nephew, MD  Fluticasone-Salmeterol (ADVAIR DISKUS) 250-50 MCG/DOSE AEPB Inhale 1 puff into the lungs 2 (two) times daily. 07/06/14   Shanker Kristeen Mans, MD  levETIRAcetam (KEPPRA) 500 MG tablet Take 1 tablet (500 mg total) by mouth 2 (two) times daily. 07/06/14   Shanker Kristeen Mans, MD  montelukast (SINGULAIR) 10 MG tablet Take 1 tablet (10 mg total) by mouth at  bedtime. 07/06/14   Shanker Kristeen Mans, MD  Prenatal Multivit-Min-Fe-FA (PRENATAL VITAMINS) 0.8 MG tablet Take 1 tablet by mouth daily. 06/10/14   Nila Nephew, MD   BP 133/80 mmHg  Pulse 80  Temp(Src) 98.4 F (36.9 C) (Oral)  Resp 16  SpO2 100%  LMP 01/24/2014 Physical Exam  Constitutional: She appears well-developed and well-nourished. No distress.  HENT:  Head: Normocephalic and atraumatic.  Mouth/Throat: Oropharynx is clear and moist.  Eyes: Conjunctivae are normal.  Neck: Normal range of motion.  Cardiovascular: Normal rate, regular rhythm and intact distal pulses.   Pulmonary/Chest: Tachypnea noted. She has decreased breath sounds. She has wheezes.  Diffuse expiratory wheezing and diminished breath sounds.  Patient unable to speak in complete sentences.    Musculoskeletal: Normal range of motion.  Neurological: She is alert.  Skin: Skin is warm and dry.  Nursing note and vitals reviewed.   ED Course  Procedures   DIAGNOSTIC STUDIES:  Oxygen Saturation is 100% on RA, normal by my interpretation.    COORDINATION OF CARE:  3:41 PM Will order hour long nebulizer.  Discussed treatment plan with pt at bedside and she agreed to plan.  Labs Review Labs Reviewed - No data to display  Imaging Review No results found. I have personally reviewed and evaluated these images and lab results as part of my medical decision-making.   EKG Interpretation None     5:15 PM Patient moved out of FT to the acute side of the ED.  Will be followed by another Gursimran Litaker.   MDM   Final diagnoses:  None  Patient with a history of Asthma presents today with SOB, wheezing, and chest tightness.  She reports symptoms similar to previous Asthma exacerbations.  Patient with wheezing and diminished breath sounds on exam.  Continuous neb ordered and patient given IV Solumedrol.  Patient moved to the acute part of the ED and care with be followed up with another Laksh Hinners.    I personally performed  the services described in this documentation, which was scribed in my presence. The recorded information has been reviewed and is accurate.   Hyman Bible, PA-C 10/02/14 [place], MD 10/04/14 Shelah Lewandowsky

## 2014-10-02 NOTE — ED Notes (Addendum)
Pt reports asthma exacerbation x "a few days." Attempted using albuterol inhaler with no alleviation. Pt is 8 months pregnant-high risk d/t asthma and seizures. Takes Keppra for seizures. C/o chest tightness. Voice is audibly hoarse. No other c/c. RR even/unlabored. Pt denies any abdominal cramping/bleeding/fluid leakage. Denies any symptoms r/t pregnancy.

## 2014-10-02 NOTE — ED Provider Notes (Signed)
CSN: [id number]     Arrival date & time 10/02/14  1447 History   First MD Initiated Contact with Patient 10/02/14 1523     Chief Complaint  Patient presents with  . Asthma  . 8 months Pregnant      (Consider location/radiation/quality/duration/timing/severity/associated sxs/prior Treatment) HPI Comments: Patient presents to the emergency department for difficulty breathing. Patient does have history of asthma. Patient has had multiple hospitalizations for asthma exacerbation in the past. She also has been intubated for asthma. Patient reports that over the last 2 or 3 days she has been experiencing progressively worsening shortness of breath that does not improve with her nebulizer treatment.  Patient is approximately 8 months pregnant. She is not expressing any abdominal, pelvic complaints. No bleeding or discharge, no fluid rush.  Patient is a 24 y.o. female presenting with asthma.  Asthma Associated symptoms include shortness of breath. Pertinent negatives include no abdominal pain.    Past Medical History  Diagnosis Date  . Asthma   . Seizures   . Sickle cell trait   . Pancreatitis   . Heart murmur   . GERD (gastroesophageal reflux disease)    Past Surgical History  Procedure Laterality Date  . Nerve, tendon and artery repair Right 09/23/2012    Procedure: I&D and Repair As Necessary/Right Hand and Palm;  Surgeon: Roseanne Kaufman, MD;  Location: [facility];  Service: Orthopedics;  Laterality: Right;  . Tonsillectomy    . Hernia repair    . Cesarean section    . Hand surgery    . Appendectomy     Family History  Problem Relation Age of Onset  . Cancer Mother     cervical cancer  . Asthma Mother   . Hypertension Father   . Sickle cell anemia Father   . Asthma Sister   . Diabetes Maternal Aunt   . Cancer Maternal Grandmother    Social History  Substance Use Topics  . Smoking status: Former Smoker    Types: Cigarettes    Quit date: 03/27/2014  . Smokeless tobacco: Never  Used  . Alcohol Use: No   OB History    Gravida Para Term Preterm AB TAB SAB Ectopic Multiple Living   3 2 2       2      Review of Systems  Constitutional: Negative for fever.  Respiratory: Positive for cough and shortness of breath.   Gastrointestinal: Negative for abdominal pain.  All other systems reviewed and are negative.     Allergies  Avocado; Cheese; Chocolate; Ibuprofen; Orange juice; Other; Peach; Peanuts; Pear; Prednisone; Raspberry; Tylenol; Amoxicillin; Doxycycline; Erythromycin; Ivp dye; Latex; Milk of magnesia; and Penicillins  Home Medications   Prior to Admission medications   Medication Sig Start Date End Date Taking? Authorizing Zacary Bauer  albuterol (PROVENTIL HFA;VENTOLIN HFA) 108 (90 BASE) MCG/ACT inhaler Inhale 2 puffs into the lungs every 4 (four) hours as needed for wheezing or shortness of breath. 07/06/14  Yes Shanker Kristeen Mans, MD  albuterol (PROVENTIL) (2.5 MG/3ML) 0.083% nebulizer solution Take 3 mLs (2.5 mg total) by nebulization every 4 (four) hours as needed for wheezing or shortness of breath. 07/06/14  Yes Shanker Kristeen Mans, MD  cyclobenzaprine (FLEXERIL) 10 MG tablet Take 1 tablet (10 mg total) by mouth every 8 (eight) hours as needed for muscle spasms. 09/30/14  Yes Osborne Oman, MD  diphenhydrAMINE (BENADRYL) 50 MG tablet Take 1 tablet (50 mg total) by mouth at bedtime as needed for itching. 09/30/14  Yes  Osborne Oman, MD  Fluticasone-Salmeterol (ADVAIR DISKUS) 250-50 MCG/DOSE AEPB Inhale 1 puff into the lungs 2 (two) times daily. 07/06/14  Yes Shanker Kristeen Mans, MD  levETIRAcetam (KEPPRA) 500 MG tablet Take 1 tablet (500 mg total) by mouth 2 (two) times daily. 07/06/14  Yes Shanker Kristeen Mans, MD  montelukast (SINGULAIR) 10 MG tablet Take 1 tablet (10 mg total) by mouth at bedtime. 07/06/14  Yes Shanker Kristeen Mans, MD  Prenatal Multivit-Min-Fe-FA (PRENATAL VITAMINS) 0.8 MG tablet Take 1 tablet by mouth daily. 06/10/14  Yes Nila Nephew, MD   EPINEPHrine (ADRENALIN) 1 MG/ML injection Inject 1 mL (1 mg total) into the muscle once. 06/10/14   Nila Nephew, MD  methylPREDNISolone (MEDROL DOSEPAK) 4 MG TBPK tablet As directed 10/02/14   Orpah Greek, MD   BP 139/76 mmHg  Pulse 82  Temp(Src) 98.2 F (36.8 C) (Oral)  Resp 30  SpO2 99%  LMP 01/24/2014 Physical Exam  Constitutional: She is oriented to person, place, and time. She appears well-developed and well-nourished. No distress.  HENT:  Head: Normocephalic and atraumatic.  Right Ear: Hearing normal.  Left Ear: Hearing normal.  Nose: Nose normal.  Mouth/Throat: Oropharynx is clear and moist and mucous membranes are normal.  Eyes: Conjunctivae and EOM are normal. Pupils are equal, round, and reactive to light.  Neck: Normal range of motion. Neck supple.  Cardiovascular: Regular rhythm, S1 normal and S2 normal.  Exam reveals no gallop and no friction rub.   No murmur heard. Pulmonary/Chest: Effort normal. Tachypnea noted. No respiratory distress. She has decreased breath sounds. She has wheezes. She exhibits no tenderness.  Abdominal: Soft. Normal appearance and bowel sounds are normal. There is no hepatosplenomegaly. There is no tenderness. There is no rebound, no guarding, no tenderness at McBurney's point and negative Murphy's sign. No hernia.  Musculoskeletal: Normal range of motion.  Neurological: She is alert and oriented to person, place, and time. She has normal strength. No cranial nerve deficit or sensory deficit. Coordination normal. GCS eye subscore is 4. GCS verbal subscore is 5. GCS motor subscore is 6.  Skin: Skin is warm, dry and intact. No rash noted. No cyanosis.  Psychiatric: She has a normal mood and affect. Her speech is normal and behavior is normal. Thought content normal.  Nursing note and vitals reviewed.   ED Course  Procedures (including critical care time) Labs Review Labs Reviewed  CBC WITH DIFFERENTIAL/PLATELET - Abnormal; Notable for  the following:    WBC 15.0 (*)    HCT 35.8 (*)    Neutro Abs 10.6 (*)    All other components within normal limits  BASIC METABOLIC PANEL - Abnormal; Notable for the following:    Sodium 134 (*)    Potassium 3.1 (*)    BUN <5 (*)    Creatinine, Ser 0.39 (*)    Calcium 8.8 (*)    All other components within normal limits  BLOOD GAS, ARTERIAL - Abnormal; Notable for the following:    pH, Arterial 7.466 (*)    pCO2 arterial 25.1 (*)    pO2, Arterial 125 (*)    Bicarbonate 17.9 (*)    Acid-base deficit 4.2 (*)    All other components within normal limits    Imaging Review No results found. I have personally reviewed and evaluated these images and lab results as part of my medical decision-making.   EKG Interpretation None      MDM   Final diagnoses:  Asthma, mild persistent, with acute  exacerbation    Presents to the ER for evaluation of difficulty breathing. Patient has been experiencing shortness of breath over the past few days. She does have a history of asthma. Patient is 8 months pregnant. She is not expressing any gynecologic or obstetric complaints. She did have wheezing apparent on arrival. She is not hypoxic however. This was confirmed with blood gas. Blood gas shows findings of hyperventilation. This is consistent with her physical presentation as well. Aeration is excellent. Patient feels improved with treatment here in the ER, feels well enough to be discharged. Will follow up with her doctor.    Orpah Greek, MD 10/02/14 2042

## 2014-10-02 NOTE — ED Notes (Signed)
She states she "don't feel any better".  She continues to be tachypneic with come retractions.

## 2014-10-07 ENCOUNTER — Ambulatory Visit (HOSPITAL_COMMUNITY): Admit: 2014-10-07 | Payer: Medicaid Other

## 2014-10-07 ENCOUNTER — Ambulatory Visit (HOSPITAL_COMMUNITY)
Admission: RE | Admit: 2014-10-07 | Discharge: 2014-10-07 | Disposition: A | Discharge status: Home / Self Care | Payer: Medicaid Other | Source: Ambulatory Visit | Attending: Family Medicine | Admitting: Family Medicine

## 2014-10-07 ENCOUNTER — Encounter (HOSPITAL_COMMUNITY): Payer: Self-pay

## 2014-10-07 VITALS — BP 127/57 | HR 81 | Wt 174.5 lb

## 2014-10-07 DIAGNOSIS — Z3A34 34 weeks gestation of pregnancy: Secondary | ICD-10-CM | POA: Diagnosis not present

## 2014-10-07 DIAGNOSIS — O281 Abnormal biochemical finding on antenatal screening of mother: Secondary | ICD-10-CM

## 2014-10-07 DIAGNOSIS — O99519 Diseases of the respiratory system complicating pregnancy, unspecified trimester: Secondary | ICD-10-CM

## 2014-10-07 DIAGNOSIS — O3421 Maternal care for scar from previous cesarean delivery: Secondary | ICD-10-CM | POA: Diagnosis not present

## 2014-10-07 DIAGNOSIS — O34219 Maternal care for unspecified type scar from previous cesarean delivery: Secondary | ICD-10-CM

## 2014-10-07 DIAGNOSIS — J45909 Unspecified asthma, uncomplicated: Secondary | ICD-10-CM

## 2014-10-07 DIAGNOSIS — G40909 Epilepsy, unspecified, not intractable, without status epilepticus: Secondary | ICD-10-CM | POA: Diagnosis not present

## 2014-10-07 DIAGNOSIS — O99353 Diseases of the nervous system complicating pregnancy, third trimester: Principal | ICD-10-CM

## 2014-10-07 DIAGNOSIS — O99350 Diseases of the nervous system complicating pregnancy, unspecified trimester: Secondary | ICD-10-CM | POA: Diagnosis not present

## 2014-10-07 DIAGNOSIS — O9989 Other specified diseases and conditions complicating pregnancy, childbirth and the puerperium: Secondary | ICD-10-CM | POA: Insufficient documentation

## 2014-10-12 ENCOUNTER — Inpatient Hospital Stay (HOSPITAL_COMMUNITY)
Admission: AD | Admit: 2014-10-12 | Discharge: 2014-10-12 | Disposition: A | Discharge status: Home Health | Payer: Medicaid Other | Source: Ambulatory Visit | Attending: Family Medicine | Admitting: Family Medicine

## 2014-10-12 ENCOUNTER — Encounter (HOSPITAL_COMMUNITY): Payer: Self-pay | Admitting: *Deleted

## 2014-10-12 DIAGNOSIS — Z881 Allergy status to other antibiotic agents status: Secondary | ICD-10-CM | POA: Insufficient documentation

## 2014-10-12 DIAGNOSIS — N898 Other specified noninflammatory disorders of vagina: Secondary | ICD-10-CM | POA: Insufficient documentation

## 2014-10-12 DIAGNOSIS — O26893 Other specified pregnancy related conditions, third trimester: Secondary | ICD-10-CM | POA: Diagnosis not present

## 2014-10-12 DIAGNOSIS — R102 Pelvic and perineal pain: Secondary | ICD-10-CM | POA: Insufficient documentation

## 2014-10-12 DIAGNOSIS — Z3A35 35 weeks gestation of pregnancy: Secondary | ICD-10-CM | POA: Insufficient documentation

## 2014-10-12 DIAGNOSIS — Z87891 Personal history of nicotine dependence: Secondary | ICD-10-CM | POA: Insufficient documentation

## 2014-10-12 LAB — URINALYSIS, ROUTINE W REFLEX MICROSCOPIC
Bilirubin Urine: NEGATIVE
GLUCOSE, UA: NEGATIVE mg/dL
HGB URINE DIPSTICK: NEGATIVE
KETONES UR: NEGATIVE mg/dL
LEUKOCYTES UA: NEGATIVE
Nitrite: NEGATIVE
PROTEIN: NEGATIVE mg/dL
Specific Gravity, Urine: <1.005 — ABNORMAL LOW (ref 1.005–1.030)
UROBILINOGEN UA: 0.2 mg/dL (ref 0.0–1.0)
pH: 6.5 (ref 5.0–8.0)

## 2014-10-12 LAB — WET PREP, GENITAL
Trich, Wet Prep: NONE SEEN
Yeast Wet Prep HPF POC: NONE SEEN

## 2014-10-12 LAB — GC/CHLAMYDIA PROBE AMP (~~LOC~~) NOT AT ARMC
Chlamydia: NEGATIVE
Neisseria Gonorrhea: NEGATIVE

## 2014-10-12 LAB — OB RESULTS CONSOLE GC/CHLAMYDIA: GC PROBE AMP, GENITAL: NEGATIVE

## 2014-10-12 MED ORDER — METRONIDAZOLE 500 MG PO TABS: 500.0000 mg | ORAL_TABLET | Freq: Two times a day (BID) | ORAL | Status: DC

## 2014-10-12 NOTE — MAU Note (Signed)
Two days ago vaginal discharge that is described to look like a mucus plug. Was clear that day, but yesterday it was orange/brown in color. Since mucus plug, pressure and sharp pain in vagina. Pain rated at a 10/10. Not having any urinary s/s. Scheduled for a C-Section on 11/08/14.

## 2014-10-12 NOTE — Discharge Instructions (Signed)
Bacterial Vaginosis Bacterial vaginosis is an infection of the vagina. It happens when too many of certain germs (bacteria) grow in the vagina. HOME CARE  Take your medicine as told by your doctor.  Finish your medicine even if you start to feel better.  Do not have sex until you finish your medicine and are better.  Tell your sex partner that you have an infection. They should see their doctor for treatment.  Practice safe sex. Use condoms. Have only one sex partner. GET HELP IF:  You are not getting better after 3 days of treatment.  You have more grey fluid (discharge) coming from your vagina than before.  You have more pain than before.  You have a fever. MAKE SURE YOU:   Understand these instructions.  Will watch your condition.  Will get help right away if you are not doing well or get worse. Document Released: 10/11/2007 Document Revised: 10/22/2012 Document Reviewed: 08/13/2012 [facility] Patient Information 2015 [place], [place]. This information is not intended to replace advice given to you by your health care provider. Make sure you discuss any questions you have with your health care provider. Premature Rupture and Preterm Premature Rupture of Membranes A sac made up of membranes surrounds your baby in the womb (uterus). When this sac breaks before contractions or labor starts, it is called premature rupture of membranes (PROM). Rupture of membranes is also known as your water breaking. If this happens before 37 weeks, it is called preterm premature rupture of membranes (PPROM). PPROM is serious. It needs medical care right away. CAUSES  PROM may be caused by the membranes getting weak. This happens at the end of pregnancy. PPROM is often due to an infection, but can be caused by a number of other things.  SIGNS OF PROM OR PPROM  A sudden gush of fluid from the vagina.  A slow leak of fluid from the vagina.  Your underwear stay wet. WHAT TO DO IF YOU THINK YOUR  WATER BROKE Call your doctor right away. You will need to go to the hospital to get checked right away. WHAT HAPPENS IF YOU ARE TOLD YOU HAVE PROM OR PPROM? You will have tests done at the hospital. If you have PROM, you may be given medicine to start labor (induced). This may happen if you are not having contractions within 24 hours of your water breaking. If you have PPROM and are not having contractions, you may be given medicine to start labor. It will depend on how far along you are in your pregnancy. If you have PPROM, you:  And your baby will be watched closely for signs of infection or other problems.  May be given an antibiotic medicine. This can stop an infection from starting.  May be given a steroid medicine. This can help the lungs to develop faster.  May be given a medicine to stop early labor (preterm labor).  May be told to stay in bed except to use the restroom (bed rest).  May be given medicine to start labor. This can happen if there are problems with you or the baby. Your treatment will depend on many factors. Document Released: 03/30/2008 Document Revised: 09/03/2012 Document Reviewed: 04/22/2012 [facility] Patient Information 2015 [place], [place]. This information is not intended to replace advice given to you by your health care provider. Make sure you discuss any questions you have with your health care provider.

## 2014-10-12 NOTE — MAU Provider Note (Signed)
History     CSN: [id number]  Arrival date and time: 10/12/14 0125   First Provider Initiated Contact with Patient 10/12/14 0245      Chief Complaint  Patient presents with  . Back Pain    and vaginal pain   HPI  Patient is 24 y.o. [id number] [redacted]w[redacted]d here with complaints of vaginal pain/pressure as well as vaginal discharge.  Ms. Palka reports that for the past week and a half she has been experiencing vaginal pain/pressure. She says the sensation is constant and is not improved or worsened by position or activity. She describes the sensation as feeling like a bowling ball is sitting in her pelvis. She endorses increased urinary frequency, but denies dysuria.  Ms. Sandstrom reports that four days ago she noticed clear vaginal discharge, which subsequently resolved spontaneously. Two days ago she noticed brown vaginal discharge. This has not completely resolved, and is still present on toilet paper when she wipes after using the bathroom. She reports that the discharge on both occasions is very thick and sticky. She denies any vaginal bleeding or LOF.  She does report feeling contractions. +FM.   Past Medical History  Diagnosis Date  . Asthma   . Seizures   . Sickle cell trait   . Pancreatitis   . Heart murmur   . GERD (gastroesophageal reflux disease)     Past Surgical History  Procedure Laterality Date  . Nerve, tendon and artery repair Right 09/23/2012    Procedure: I&D and Repair As Necessary/Right Hand and Palm;  Surgeon: Roseanne Kaufman, MD;  Location: [facility];  Service: Orthopedics;  Laterality: Right;  . Tonsillectomy    . Hernia repair    . Cesarean section    . Hand surgery    . Appendectomy      Family History  Problem Relation Age of Onset  . Cancer Mother     cervical cancer  . Asthma Mother   . Hypertension Father   . Sickle cell anemia Father   . Asthma Sister   . Diabetes Maternal Aunt   . Cancer Maternal Grandmother     Social History  Substance Use  Topics  . Smoking status: Former Smoker    Types: Cigarettes    Quit date: 03/27/2014  . Smokeless tobacco: Never Used  . Alcohol Use: No    Allergies:  Allergies  Allergen Reactions  . Avocado Anaphylaxis  . Cheese Shortness Of Breath    Asthma trigger  . Chocolate Shortness Of Breath    Asthma trigger  . Ibuprofen Hives and Shortness Of Breath    Tolerates naproxen  . Orange Juice [Orange Oil] Shortness Of Breath  . Other Hives    ALLERGIC TO ALL STEROIDS       EXCEPT IV SOLU MEDROL  . Peach [Prunus Persica] Anaphylaxis  . Peanuts [Peanut Oil] Anaphylaxis  . Pear Anaphylaxis  . Prednisone Hives  . Raspberry Anaphylaxis  . Tylenol [Acetaminophen] Hives and Shortness Of Breath  . Amoxicillin Hives  . Doxycycline Hives  . Erythromycin Hives  . Ivp Dye [Iodinated Diagnostic Agents]     Shortness of breath   . Latex     Swelling/anaphylaxis  . Milk Of Magnesia [Magnesium Hydroxide] Hives and Itching  . Penicillins Hives    Prescriptions prior to admission  Medication Sig Dispense Refill Last Dose  . albuterol (PROVENTIL HFA;VENTOLIN HFA) 108 (90 BASE) MCG/ACT inhaler Inhale 2 puffs into the lungs every 4 (four) hours as needed for wheezing or shortness  of breath. 1 Inhaler 0 Past Week at Unknown time  . Prenatal Multivit-Min-Fe-FA (PRENATAL VITAMINS) 0.8 MG tablet Take 1 tablet by mouth daily. 30 tablet 12 10/11/2014 at Unknown time  . albuterol (PROVENTIL) (2.5 MG/3ML) 0.083% nebulizer solution Take 3 mLs (2.5 mg total) by nebulization every 4 (four) hours as needed for wheezing or shortness of breath. 75 mL 0 Unknown at Unknown time  . cyclobenzaprine (FLEXERIL) 10 MG tablet Take 1 tablet (10 mg total) by mouth every 8 (eight) hours as needed for muscle spasms. 30 tablet 5 Unknown at Unknown time  . diphenhydrAMINE (BENADRYL) 50 MG tablet Take 1 tablet (50 mg total) by mouth at bedtime as needed for itching. (Patient not taking: Reported on 10/07/2014) 30 tablet 6 Unknown at  Unknown time  . EPINEPHrine (ADRENALIN) 1 MG/ML injection Inject 1 mL (1 mg total) into the muscle once. 1 mL 1 Unknown at Unknown time  . Fluticasone-Salmeterol (ADVAIR DISKUS) 250-50 MCG/DOSE AEPB Inhale 1 puff into the lungs 2 (two) times daily. 60 each 0 Unknown at Unknown time  . levETIRAcetam (KEPPRA) 500 MG tablet Take 1 tablet (500 mg total) by mouth 2 (two) times daily. 60 tablet 0 Unknown at Unknown time  . methylPREDNISolone (MEDROL DOSEPAK) 4 MG TBPK tablet As directed 21 tablet 0 Unknown at Unknown time  . montelukast (SINGULAIR) 10 MG tablet Take 1 tablet (10 mg total) by mouth at bedtime. 30 tablet 0 Unknown at Unknown time    Review of Systems  Constitutional: Negative for fever and chills.  Eyes: Negative for blurred vision and double vision.  Gastrointestinal: Positive for diarrhea. Negative for nausea and vomiting.  Genitourinary: Positive for frequency. Negative for dysuria and urgency.  Neurological: Negative for headaches.   Physical Exam   Blood pressure 112/91, pulse 99, temperature 98.5 F (36.9 C), temperature source Oral, resp. rate 18, height 5\' 4"  (1.626 m), weight 174 lb 8 oz (79.153 kg), last menstrual period 01/24/2014, SpO2 100 %.  Physical Exam  Nursing note and vitals reviewed. Constitutional: She is oriented to person, place, and time. She appears well-developed and well-nourished. No distress.  HENT:  Head: Normocephalic and atraumatic.  Cardiovascular: Normal rate.   Respiratory: Effort normal. No respiratory distress.  GI: There is no tenderness.  Gravid  Genitourinary: Vaginal discharge found.  White discharge noted, no odor present  Musculoskeletal: She exhibits no edema or tenderness.  Neurological: She is alert and oriented to person, place, and time. No cranial nerve deficit.  Skin: Skin is warm and dry.  Psychiatric: She has a normal mood and affect. Her behavior is normal.    MAU Course  Procedures None  MDM UA - unremarkable   Wet prep - few clue cells, moderate bacteria Cervix check - fingertip, thick GC/chlamydia probe - pending  Assessment and Plan  A: Patient is 24 y.o. [id number] [redacted]w[redacted]d reporting vaginal pressure and discharge. Given presence of clue cells and bacteria, will treat for BV. Reactive NST, cervix not dilated, not contracting. No bleeding.   P: Discharge home - Reviewed findings and my conclusion - Handout given - Follow-up with OB provider this week as scheduled - Will f/u GC/chlamydia and contact pt if positive - Prescribe Flagyl for BV   Adin Hector, MD PGY-1 [facility]  10/12/2014, 4:13 AM   OB FELLOW MAU DISCHARGE ATTESTATION  I have seen and examined this patient; I agree with above documentation in the resident's note.   Desma Maxim, MD 4:24 AM

## 2014-10-14 ENCOUNTER — Ambulatory Visit (INDEPENDENT_AMBULATORY_CARE_PROVIDER_SITE_OTHER): Payer: Medicaid Other | Admitting: Family

## 2014-10-14 VITALS — BP 130/80 | HR 82 | Wt 173.1 lb

## 2014-10-14 DIAGNOSIS — O358XX1 Maternal care for other (suspected) fetal abnormality and damage, fetus 1: Secondary | ICD-10-CM

## 2014-10-14 DIAGNOSIS — O35BXX1 Maternal care for other (suspected) fetal abnormality and damage, fetal cardiac anomalies, fetus 1: Secondary | ICD-10-CM

## 2014-10-14 DIAGNOSIS — O0992 Supervision of high risk pregnancy, unspecified, second trimester: Secondary | ICD-10-CM

## 2014-10-14 LAB — POCT URINALYSIS DIP (DEVICE)
GLUCOSE, UA: NEGATIVE mg/dL
HGB URINE DIPSTICK: NEGATIVE
Ketones, ur: NEGATIVE mg/dL
LEUKOCYTES UA: NEGATIVE
NITRITE: NEGATIVE
Protein, ur: NEGATIVE mg/dL
Specific Gravity, Urine: 1.02 (ref 1.005–1.030)
Urobilinogen, UA: 1 mg/dL (ref 0.0–1.0)
pH: 7 (ref 5.0–8.0)

## 2014-10-14 NOTE — Progress Notes (Signed)
Subjective:  Jasmine [name] is a 24 y.o. G3P2002 at [redacted]w[redacted]d being seen today for ongoing prenatal care.  Patient reports occasional contractions.  Contractions: Not present.  Vag. Bleeding: None. Movement: Present. Denies leaking of fluid.   The following portions of the patient's history were reviewed and updated as appropriate: allergies, current medications, past family history, past medical history, past social history, past surgical history and problem list.   Objective:   Filed Vitals:   10/14/14 1032  BP: 130/80  Pulse: 82  Weight: 173 lb 1.6 oz (78.518 kg)    Fetal Status: Fetal Heart Rate (bpm): 130 Fundal Height: 36 cm Movement: Present     General:  Alert, oriented and cooperative. Patient is in no acute distress.  Skin: Skin is warm and dry. No rash noted.   Cardiovascular: Normal heart rate noted  Respiratory: Normal respiratory effort, no problems with respiration noted  Abdomen: Soft, gravid, appropriate for gestational age. Pain/Pressure: Present     Pelvic: Vag. Bleeding: None     Cervical exam performed Dilation: Fingertip      Extremities: Normal range of motion.  Edema: None  Mental Status: Normal mood and affect. Normal behavior. Normal judgment and thought content.   Urinalysis: Urine Protein: Negative Urine Glucose: Negative  Assessment and Plan:  Pregnancy: G3P2002 at [redacted]w[redacted]d  1. Supervision of high risk pregnancy, antepartum, second trimester - Continue appts  Preterm labor symptoms and general obstetric precautions including but not limited to vaginal bleeding, contractions, leaking of fluid and fetal movement were reviewed in detail with the patient. Please refer to After Visit Summary for other counseling recommendations.  Return in about 1 week (around 10/21/2014).   Venia Carbon Michiel Cowboy, CNM

## 2014-10-21 ENCOUNTER — Ambulatory Visit: Payer: Medicaid Other | Admitting: Obstetrics & Gynecology

## 2014-10-21 DIAGNOSIS — O0992 Supervision of high risk pregnancy, unspecified, second trimester: Secondary | ICD-10-CM

## 2014-10-21 LAB — POCT URINALYSIS DIP (DEVICE)
BILIRUBIN URINE: NEGATIVE
Glucose, UA: NEGATIVE mg/dL
HGB URINE DIPSTICK: NEGATIVE
KETONES UR: NEGATIVE mg/dL
LEUKOCYTES UA: NEGATIVE
NITRITE: NEGATIVE
Protein, ur: NEGATIVE mg/dL
SPECIFIC GRAVITY, URINE: 1.025 (ref 1.005–1.030)
Urobilinogen, UA: 1 mg/dL (ref 0.0–1.0)
pH: 6.5 (ref 5.0–8.0)

## 2014-10-21 NOTE — Progress Notes (Signed)
Pt left AMA before being seen.  There was no visit.  Chart was not reviewed.

## 2014-10-28 ENCOUNTER — Ambulatory Visit (INDEPENDENT_AMBULATORY_CARE_PROVIDER_SITE_OTHER): Payer: Medicaid Other | Admitting: Obstetrics & Gynecology

## 2014-10-28 ENCOUNTER — Ambulatory Visit (INDEPENDENT_AMBULATORY_CARE_PROVIDER_SITE_OTHER): Payer: Medicaid Other | Admitting: Emergency Medicine

## 2014-10-28 ENCOUNTER — Encounter: Payer: Self-pay | Admitting: Emergency Medicine

## 2014-10-28 VITALS — BP 112/72 | HR 83 | Ht 64.0 in | Wt 176.8 lb

## 2014-10-28 VITALS — BP 124/66 | HR 88 | Temp 99.2°F | Wt 174.2 lb

## 2014-10-28 DIAGNOSIS — O34219 Maternal care for unspecified type scar from previous cesarean delivery: Secondary | ICD-10-CM | POA: Diagnosis not present

## 2014-10-28 DIAGNOSIS — Z23 Encounter for immunization: Secondary | ICD-10-CM

## 2014-10-28 DIAGNOSIS — O9989 Other specified diseases and conditions complicating pregnancy, childbirth and the puerperium: Secondary | ICD-10-CM

## 2014-10-28 DIAGNOSIS — J45909 Unspecified asthma, uncomplicated: Secondary | ICD-10-CM | POA: Diagnosis not present

## 2014-10-28 DIAGNOSIS — O0993 Supervision of high risk pregnancy, unspecified, third trimester: Secondary | ICD-10-CM | POA: Diagnosis present

## 2014-10-28 DIAGNOSIS — O99519 Diseases of the respiratory system complicating pregnancy, unspecified trimester: Principal | ICD-10-CM

## 2014-10-28 LAB — POCT URINALYSIS DIP (DEVICE)
Bilirubin Urine: NEGATIVE
GLUCOSE, UA: NEGATIVE mg/dL
Hgb urine dipstick: NEGATIVE
KETONES UR: NEGATIVE mg/dL
Leukocytes, UA: NEGATIVE
Nitrite: NEGATIVE
PROTEIN: NEGATIVE mg/dL
Specific Gravity, Urine: 1.02 (ref 1.005–1.030)
Urobilinogen, UA: 0.2 mg/dL (ref 0.0–1.0)
pH: 7.5 (ref 5.0–8.0)

## 2014-10-28 NOTE — Assessment & Plan Note (Signed)
Her asthma has been more labile during this pregnancy. She required a prednisone taper and increased albuterol about 3 weeks ago during a trip to the emergency department. I like to continue her current Advair and albuterol for now. She will deliver in one week. I will reassess her in 2 months. Suspect we will be able to scale back her asthma regimen at that time.. If she is stable at that time I'll perform pulmonary function testing to help [place] make this decision.

## 2014-10-28 NOTE — Patient Instructions (Signed)
Please continue Advair twice a day Continue to use albuterol 2 puffs as needed for shortness of breath Follow with Dr Lamonte Sakai in 2 months. If she were doing well at that time then we will plan to perform full pulmonary function testing to compare with priors

## 2014-10-28 NOTE — Patient Instructions (Signed)
Vaginal Birth After Cesarean Delivery Vaginal birth after cesarean delivery (VBAC) is giving birth vaginally after previously delivering a baby by a cesarean. In the past, if a woman had a cesarean delivery, all births afterward would be done by cesarean delivery. This is no longer true. It can be safe for the mother to try a vaginal delivery after having a cesarean delivery.  It is important to discuss VBAC with your health care provider early in the pregnancy so you can understand the risks, benefits, and options. It will give you time to decide what is best in your particular case. The final decision about whether to have a VBAC or repeat cesarean delivery should be between you and your health care provider. Any changes in your health or your baby's health during your pregnancy may make it necessary to change your initial decision about VBAC.  WOMEN WHO PLAN TO HAVE A VBAC SHOULD CHECK WITH THEIR HEALTH CARE PROVIDER TO BE SURE THAT:  The previous cesarean delivery was done with a low transverse uterine cut (incision) (not a vertical classical incision).   The birth canal is big enough for the baby.   There were no other operations on the uterus.   An electronic fetal monitor (EFM) will be on at all times during labor.   An operating room will be available and ready in case an emergency cesarean delivery is needed.   A health care provider and surgical nursing staff will be available at all times during labor to be ready to do an emergency delivery cesarean if necessary.   An anesthesiologist will be present in case an emergency cesarean delivery is needed.   The nursery is prepared and has adequate personnel and necessary equipment available to care for the baby in case of an emergency cesarean delivery. BENEFITS OF VBAC  Shorter stay in the hospital.   Avoidance of risks associated with cesarean delivery, such as:  Surgical complications, such as opening of the incision or  hernia in the incision.  Injury to other organs.  Fever. This can occur if an infection develops after surgery. It can also occur as a reaction to the medicine given to make you numb during the surgery.  Less blood loss and need for blood transfusions.  Lower risk of blood clots and infection.  Shorter recovery.   Decreased risk for having to remove the uterus (hysterectomy).   Decreased risk for the placenta to completely or partially cover the opening of the uterus (placenta previa) with a future pregnancy.   Decrease risk in future labor and delivery. RISKS OF A VBAC  Tearing (rupture) of the uterus. This is occurs in less than 1% of VBACs. The risk of this happening is higher if:  Steps are taken to begin the labor process (induce labor) or stimulate or strengthen contractions (augment labor).   Medicine is used to soften (ripen) the cervix.  Having to remove the uterus (hysterectomy) if it ruptures. VBAC SHOULD NOT BE DONE IF:  The previous cesarean delivery was done with a vertical (classical) or T-shaped incision or you do not know what kind of incision was made.   You had a ruptured uterus.   You have had certain types of surgery on your uterus, such as removal of uterine fibroids. Ask your health care provider about other types of surgeries that prevent you from having a VBAC.  You have certain medical or childbirth (obstetrical) problems.   There are problems with the baby.   You  Ask your health care provider about other types of surgeries that prevent you from having a VBAC.  · You have certain medical or childbirth (obstetrical) problems.    · There are problems with the baby.    · You have had two previous cesarean deliveries and no vaginal deliveries.  OTHER FACTS TO KNOW ABOUT VBAC:  · It is safe to have an epidural anesthetic with VBAC.    · It is safe to turn the baby from a breech position (attempt an external cephalic version).    · It is safe to try a VBAC with twins.    · VBAC may not be successful if your baby weights 8.8 lb (4 kg) or more. However, weight predictions are not always accurate and should not be used alone to decide if VBAC is right for  you.  · There is an increased failure rate if the time between the cesarean delivery and VBAC is less than 19 months.    · Your health care provider may advise against a VBAC if you have preeclampsia (high blood pressure, protein in the urine, and swelling of face and extremities).    · VBAC is often successful if you previously gave birth vaginally.    · VBAC is often successful when the labor starts spontaneously before the due date.    · Delivering a baby through a VBAC is similar to having a normal spontaneous vaginal delivery.     This information is not intended to replace advice given to you by your health care provider. Make sure you discuss any questions you have with your health care provider.     Document Released: 06/24/2006 Document Revised: 01/22/2014 Document Reviewed: 07/31/2012  Elsevier Interactive Patient Education ©2016 [employer]

## 2014-10-28 NOTE — Progress Notes (Signed)
Subjective: c/s on 10/24 scheduled but will TOLAC if labors  Jasmine [name] is a 24 y.o. G3P2002 at [redacted]w[redacted]d being seen today for ongoing prenatal care.  Patient reports occasional contractions.  Contractions: Not present.  Vag. Bleeding: None. Movement: Present. Denies leaking of fluid.   The following portions of the patient's history were reviewed and updated as appropriate: allergies, current medications, past family history, past medical history, past social history, past surgical history and problem list. Problem list updated.  Objective:   Filed Vitals:   10/28/14 0900  BP: 124/66  Pulse: 88  Temp: 99.2 F (37.3 C)  Weight: 174 lb 3.2 oz (79.017 kg)    Fetal Status: Fetal Heart Rate (bpm): 132   Movement: Present     General:  Alert, oriented and cooperative. Patient is in no acute distress.  Skin: Skin is warm and dry. No rash noted.   Cardiovascular: Normal heart rate noted  Respiratory: Normal respiratory effort, no problems with respiration noted  Abdomen: Soft, gravid, appropriate for gestational age. Pain/Pressure: Present     Pelvic: Vag. Bleeding: None     Cervical exam performed        Extremities: Normal range of motion.  Edema: None  Mental Status: Normal mood and affect. Normal behavior. Normal judgment and thought content.   Urinalysis: Urine Protein: Negative Urine Glucose: Negative  Assessment and Plan:  Pregnancy: G3P2002 at [redacted]w[redacted]d  1. Supervision of high risk pregnancy, antepartum, third trimester Doing well  2. Previous cesarean section complicating pregnancy, antepartum condition or complication 19/16 scheduled repeat  Term labor symptoms and general obstetric precautions including but not limited to vaginal bleeding, contractions, leaking of fluid and fetal movement were reviewed in detail with the patient. Please refer to After Visit Summary for other counseling recommendations.  Return in about 1 week (around 11/04/2014). VBAC info  Woodroe Mode,  MD

## 2014-10-28 NOTE — Progress Notes (Signed)
   Subjective:    Patient ID: Jasmine [name], female    DOB: 21-Apr-1990, 24 y.o.   MRN: [id number]  HPI 24 year old female former smoker with asthma Patient is [redacted] weeks pregnant. She does have a seizure disorder and is on Keppra.  [facility] follow up 07/27/14 Patient presents for a post hospital follow-up Patient was admitted June 18 to June 21 for asthma exacerbation. She says prior to admission. She been exposed to extreme heat at work. Has shortness of breath, wheezing and tightness. She was treated with IV steroids and  broncho-dilators. She was discharged on Medrol Dosepak. Restarted on Advair and Singulair. Since discharge. Patient says that she has been improved with decreased cough, shortness of breath and wheezing. She denies any increased albuterol use. She denies any abdominal pain, contractions, swelling, seizure activity, fever, or discolored mucus. Patient has 2 children at home, ages 62 and 86. She quit smoking in March of this year. He denies any drug use. Works at [employer].  ROV 09/07/14 -- follow-up visit for history of tobacco use and asthma. She has been intubated in the past, has had freq hospitalizations in the past. She was hospitalized in June for an acute exacerbation in the setting of pregnancy.  She has been doing fairly well but does still have paroxysms of cough, associated with some wheeze. Occasionally has exertional dyspnea especially with a long walk.  Still able to work. Has not required any extra meds since last visit here. On Advair, albuterol prn (about 2x a day).    ROV 10/28/14 -- follow-up visit for tobacco use and asthma. She is currently pregnant (weeks) which is made her asthma somewhat more difficult to manage. I last saw her in 08/2014 and continued her on Advair. She is using albuterol aboutakjdh 2-3x a day, usually helps. Minimal cough or wheeze. She went to the [facility] ED 3 weeks ago and was treated with prednisone taper, was wheezing.    Review of  Systems As per HPI     Objective:   Physical Exam Filed Vitals:   10/28/14 1408  BP: 112/72  Pulse: 83  Height: 5\' 4"  (1.626 m)  Weight: 176 lb 12.8 oz (80.196 kg)  SpO2: 99%   Gen: Pleasant, well-nourished, in no distress,  normal affect, [redacted] weeks pregnant  ENT: No lesions,  mouth clear,  oropharynx clear, no postnasal drip  Neck: No JVD, no Stridor  Lungs: No use of accessory muscles, clear without rales or rhonchi  Cardiovascular: RRR, heart sounds normal, no murmur or gallops, no peripheral edema  Musculoskeletal: No deformities, no cyanosis or clubbing  Neuro: alert, non focal  Skin: Warm, no lesions or rash      Assessment & Plan:  Asthma complicating pregnancy, antepartum Her asthma has been more labile during this pregnancy. She required a prednisone taper and increased albuterol about 3 weeks ago during a trip to the emergency department. I like to continue her current Advair and albuterol for now. She will deliver in one week. I will reassess her in 2 months. Suspect we will be able to scale back her asthma regimen at that time.. If she is stable at that time I'll perform pulmonary function testing to help [place] make this decision.

## 2014-10-30 LAB — CULTURE, BETA STREP (GROUP B ONLY)

## 2014-11-04 ENCOUNTER — Other Ambulatory Visit: Payer: Self-pay | Admitting: Family Medicine

## 2014-11-04 ENCOUNTER — Ambulatory Visit (INDEPENDENT_AMBULATORY_CARE_PROVIDER_SITE_OTHER): Payer: Medicaid Other | Admitting: Family Medicine

## 2014-11-04 VITALS — BP 97/59 | HR 79 | Temp 97.6°F | Wt 175.8 lb

## 2014-11-04 DIAGNOSIS — O34219 Maternal care for unspecified type scar from previous cesarean delivery: Secondary | ICD-10-CM

## 2014-11-04 DIAGNOSIS — G40909 Epilepsy, unspecified, not intractable, without status epilepticus: Secondary | ICD-10-CM

## 2014-11-04 DIAGNOSIS — O0993 Supervision of high risk pregnancy, unspecified, third trimester: Secondary | ICD-10-CM

## 2014-11-04 DIAGNOSIS — O99353 Diseases of the nervous system complicating pregnancy, third trimester: Secondary | ICD-10-CM

## 2014-11-04 LAB — POCT URINALYSIS DIP (DEVICE)
Bilirubin Urine: NEGATIVE
GLUCOSE, UA: NEGATIVE mg/dL
Hgb urine dipstick: NEGATIVE
Ketones, ur: NEGATIVE mg/dL
Leukocytes, UA: NEGATIVE
Nitrite: NEGATIVE
PH: 7 (ref 5.0–8.0)
PROTEIN: NEGATIVE mg/dL
Specific Gravity, Urine: 1.02 (ref 1.005–1.030)
UROBILINOGEN UA: 1 mg/dL (ref 0.0–1.0)

## 2014-11-04 NOTE — Patient Instructions (Signed)

## 2014-11-04 NOTE — Progress Notes (Signed)
Subjective:  Jasmine [name] is a 24 y.o. G3P2002 at [redacted]w[redacted]d being seen today for ongoing prenatal care.  Patient reports no complaints.  Contractions: Not present.  Vag. Bleeding: Small. Movement: Present. Denies leaking of fluid.   The following portions of the patient's history were reviewed and updated as appropriate: allergies, current medications, past family history, past medical history, past social history, past surgical history and problem list. Problem list updated.  Objective:   Filed Vitals:   11/04/14 1043  BP: 97/59  Pulse: 79  Temp: 97.6 F (36.4 C)  Weight: 175 lb 12.8 oz (79.742 kg)    Fetal Status: Fetal Heart Rate (bpm): 131   Movement: Present     General:  Alert, oriented and cooperative. Patient is in no acute distress.  Skin: Skin is warm and dry. No rash noted.   Cardiovascular: Normal heart rate noted  Respiratory: Normal respiratory effort, no problems with respiration noted  Abdomen: Soft, gravid, appropriate for gestational age. Pain/Pressure: Present     Pelvic: Vag. Bleeding: Small     Cervical exam deferred        Extremities: Normal range of motion.  Edema: None  Mental Status: Normal mood and affect. Normal behavior. Normal judgment and thought content.   Urinalysis:      Assessment and Plan:  Pregnancy: G3P2002 at [redacted]w[redacted]d  1. Supervision of high risk pregnancy, antepartum, third trimester Fern neg - cervix unchanged.  FHt normal  2. Previous cesarean section complicating pregnancy, antepartum condition or complication Repeat c-section scheduled for Monday.  3. Seizure disorder during pregnancy, antepartum, third trimester ([place])   Term labor symptoms and general obstetric precautions including but not limited to vaginal bleeding, contractions, leaking of fluid and fetal movement were reviewed in detail with the patient. Please refer to After Visit Summary for other counseling recommendations.  No Follow-up on file.   Truett Mainland,  DO

## 2014-11-05 ENCOUNTER — Encounter (HOSPITAL_COMMUNITY)
Admission: RE | Admit: 2014-11-05 | Discharge: 2014-11-05 | Disposition: A | Discharge status: Home / Self Care | Payer: Medicaid Other | Source: Ambulatory Visit | Attending: Family Medicine | Admitting: Family Medicine

## 2014-11-05 ENCOUNTER — Encounter (HOSPITAL_COMMUNITY): Payer: Self-pay

## 2014-11-05 VITALS — BP 105/66 | HR 80 | Resp 18 | Ht 65.0 in | Wt 177.0 lb

## 2014-11-05 DIAGNOSIS — Z01818 Encounter for other preprocedural examination: Secondary | ICD-10-CM | POA: Diagnosis present

## 2014-11-05 DIAGNOSIS — O281 Abnormal biochemical finding on antenatal screening of mother: Secondary | ICD-10-CM

## 2014-11-05 DIAGNOSIS — O358XX1 Maternal care for other (suspected) fetal abnormality and damage, fetus 1: Secondary | ICD-10-CM

## 2014-11-05 DIAGNOSIS — O35BXX1 Maternal care for other (suspected) fetal abnormality and damage, fetal cardiac anomalies, fetus 1: Secondary | ICD-10-CM

## 2014-11-05 DIAGNOSIS — O280 Abnormal hematological finding on antenatal screening of mother: Secondary | ICD-10-CM

## 2014-11-05 LAB — CBC
HCT: 36.7 % (ref 36.0–46.0)
Hemoglobin: 12 g/dL (ref 12.0–15.0)
MCH: 28 pg (ref 26.0–34.0)
MCHC: 32.7 g/dL (ref 30.0–36.0)
MCV: 85.5 fL (ref 78.0–100.0)
PLATELETS: 200 10*3/uL (ref 150–400)
RBC: 4.29 MIL/uL (ref 3.87–5.11)
RDW: 14.4 % (ref 11.5–15.5)
WBC: 12.4 10*3/uL — ABNORMAL HIGH (ref 4.0–10.5)

## 2014-11-05 LAB — TYPE AND SCREEN
ABO/RH(D): B POS
Antibody Screen: NEGATIVE

## 2014-11-05 LAB — ABO/RH: ABO/RH(D): B POS

## 2014-11-05 LAB — RAPID HIV SCREEN (HIV 1/2 AB+AG)
HIV 1/2 ANTIBODIES: NONREACTIVE
HIV-1 P24 ANTIGEN - HIV24: NONREACTIVE

## 2014-11-05 NOTE — Patient Instructions (Signed)
Your procedure is scheduled on:11/08/14  Enter through the Main Entrance at :[place] up desk phone and dial [phone] and inform us of your arrival.  Please call [phone] if you have any problems the morning of surgery.  Remember: Do not eat food or drink liquids, including water, after midnight:Sunday   You may brush your teeth the morning of surgery.  Take these meds the morning of surgery with a sip of water:none  DO NOT wear jewelry, eye make-up, lipstick,body lotion, or dark fingernail polish.  (Polished toes are ok) You may wear deodorant.  If you are to be admitted after surgery, leave suitcase in car until your room has been assigned. Patients discharged on the day of surgery will not be allowed to drive home. Wear loose fitting, comfortable clothes for your ride home.

## 2014-11-06 LAB — RPR: RPR Ser Ql: NONREACTIVE

## 2014-11-08 ENCOUNTER — Inpatient Hospital Stay (HOSPITAL_COMMUNITY): Payer: Medicaid Other | Admitting: Anesthesiology

## 2014-11-08 ENCOUNTER — Encounter (HOSPITAL_COMMUNITY)
Admission: RE | Disposition: A | Discharge status: Home / Self Care | Payer: Self-pay | Source: Ambulatory Visit | Attending: Family Medicine

## 2014-11-08 ENCOUNTER — Inpatient Hospital Stay (HOSPITAL_COMMUNITY)
Admission: RE | Admit: 2014-11-08 | Discharge: 2014-11-11 | DRG: 765 | Disposition: A | Discharge status: Home / Self Care | Payer: Medicaid Other | Source: Ambulatory Visit | Attending: Family Medicine | Admitting: Family Medicine

## 2014-11-08 ENCOUNTER — Encounter (HOSPITAL_COMMUNITY): Payer: Self-pay | Admitting: Certified Registered Nurse Anesthetist

## 2014-11-08 DIAGNOSIS — J45909 Unspecified asthma, uncomplicated: Secondary | ICD-10-CM | POA: Diagnosis present

## 2014-11-08 DIAGNOSIS — O9952 Diseases of the respiratory system complicating childbirth: Secondary | ICD-10-CM | POA: Diagnosis present

## 2014-11-08 DIAGNOSIS — Z832 Family history of diseases of the blood and blood-forming organs and certain disorders involving the immune mechanism: Secondary | ICD-10-CM

## 2014-11-08 DIAGNOSIS — D573 Sickle-cell trait: Secondary | ICD-10-CM | POA: Diagnosis present

## 2014-11-08 DIAGNOSIS — Z3A39 39 weeks gestation of pregnancy: Secondary | ICD-10-CM

## 2014-11-08 DIAGNOSIS — O34211 Maternal care for low transverse scar from previous cesarean delivery: Secondary | ICD-10-CM | POA: Diagnosis present

## 2014-11-08 DIAGNOSIS — Z8249 Family history of ischemic heart disease and other diseases of the circulatory system: Secondary | ICD-10-CM

## 2014-11-08 DIAGNOSIS — Z87891 Personal history of nicotine dependence: Secondary | ICD-10-CM

## 2014-11-08 DIAGNOSIS — O99354 Diseases of the nervous system complicating childbirth: Secondary | ICD-10-CM | POA: Diagnosis present

## 2014-11-08 DIAGNOSIS — Z98891 History of uterine scar from previous surgery: Secondary | ICD-10-CM

## 2014-11-08 DIAGNOSIS — Z833 Family history of diabetes mellitus: Secondary | ICD-10-CM | POA: Diagnosis not present

## 2014-11-08 DIAGNOSIS — G40909 Epilepsy, unspecified, not intractable, without status epilepticus: Secondary | ICD-10-CM | POA: Diagnosis present

## 2014-11-08 DIAGNOSIS — N83202 Unspecified ovarian cyst, left side: Secondary | ICD-10-CM | POA: Diagnosis present

## 2014-11-08 DIAGNOSIS — K219 Gastro-esophageal reflux disease without esophagitis: Secondary | ICD-10-CM | POA: Diagnosis present

## 2014-11-08 DIAGNOSIS — O9902 Anemia complicating childbirth: Secondary | ICD-10-CM | POA: Diagnosis present

## 2014-11-08 DIAGNOSIS — O9962 Diseases of the digestive system complicating childbirth: Secondary | ICD-10-CM | POA: Diagnosis present

## 2014-11-08 DIAGNOSIS — O3483 Maternal care for other abnormalities of pelvic organs, third trimester: Secondary | ICD-10-CM | POA: Diagnosis present

## 2014-11-08 DIAGNOSIS — O34219 Maternal care for unspecified type scar from previous cesarean delivery: Secondary | ICD-10-CM | POA: Diagnosis present

## 2014-11-08 SURGERY — Surgical Case
Anesthesia: Spinal | Site: Abdomen

## 2014-11-08 MED ORDER — OXYTOCIN 10 UNIT/ML IJ SOLN
INTRAMUSCULAR | Status: AC
  Filled 2014-11-08: qty 4

## 2014-11-08 MED ORDER — OXYTOCIN 40 UNITS IN LACTATED RINGERS INFUSION - SIMPLE MED: 62.5000 mL/h | INTRAVENOUS | Status: AC

## 2014-11-08 MED ORDER — FENTANYL CITRATE (PF) 100 MCG/2ML IJ SOLN
25.0000 ug | INTRAMUSCULAR | Status: DC | PRN
  Administered 2014-11-08 (×3): 50 ug via INTRAVENOUS

## 2014-11-08 MED ORDER — NALBUPHINE HCL 10 MG/ML IJ SOLN
5.0000 mg | INTRAMUSCULAR | Status: DC | PRN
  Administered 2014-11-08: 5 mg via SUBCUTANEOUS

## 2014-11-08 MED ORDER — LANOLIN HYDROUS EX OINT: 1.0000 "application " | TOPICAL_OINTMENT | CUTANEOUS | Status: DC | PRN

## 2014-11-08 MED ORDER — MOMETASONE FURO-FORMOTEROL FUM 100-5 MCG/ACT IN AERO
2.0000 | INHALATION_SPRAY | Freq: Two times a day (BID) | RESPIRATORY_TRACT | Status: DC
  Administered 2014-11-08 – 2014-11-10 (×5): 2 via RESPIRATORY_TRACT
  Filled 2014-11-08: qty 8.8

## 2014-11-08 MED ORDER — KETAMINE HCL 10 MG/ML IJ SOLN
INTRAMUSCULAR | Status: AC
  Filled 2014-11-08: qty 20

## 2014-11-08 MED ORDER — BUPIVACAINE HCL (PF) 0.25 % IJ SOLN
INTRAMUSCULAR | Status: AC
  Filled 2014-11-08: qty 30

## 2014-11-08 MED ORDER — SENNOSIDES-DOCUSATE SODIUM 8.6-50 MG PO TABS
2.0000 | ORAL_TABLET | ORAL | Status: DC
Start: 2014-11-09 — End: 2014-11-11
  Administered 2014-11-08 – 2014-11-10 (×3): 2 via ORAL
  Filled 2014-11-08 (×3): qty 2

## 2014-11-08 MED ORDER — CYCLOBENZAPRINE HCL 10 MG PO TABS
10.0000 mg | ORAL_TABLET | Freq: Three times a day (TID) | ORAL | Status: DC | PRN
  Filled 2014-11-08: qty 1

## 2014-11-08 MED ORDER — LEVETIRACETAM 500 MG PO TABS
500.0000 mg | ORAL_TABLET | Freq: Two times a day (BID) | ORAL | Status: DC
  Administered 2014-11-08 – 2014-11-11 (×7): 500 mg via ORAL
  Filled 2014-11-08 (×9): qty 1

## 2014-11-08 MED ORDER — SIMETHICONE 80 MG PO CHEW: 80.0000 mg | CHEWABLE_TABLET | ORAL | Status: DC | PRN

## 2014-11-08 MED ORDER — CHLOROPROCAINE HCL (PF) 3 % IJ SOLN
INTRAMUSCULAR | Status: AC
  Filled 2014-11-08: qty 20

## 2014-11-08 MED ORDER — SIMETHICONE 80 MG PO CHEW
80.0000 mg | CHEWABLE_TABLET | Freq: Three times a day (TID) | ORAL | Status: DC
  Administered 2014-11-08 – 2014-11-11 (×7): 80 mg via ORAL
  Filled 2014-11-08 (×9): qty 1

## 2014-11-08 MED ORDER — OXYCODONE HCL 5 MG PO TABS
5.0000 mg | ORAL_TABLET | ORAL | Status: DC | PRN
  Administered 2014-11-09 (×2): 5 mg via ORAL
  Filled 2014-11-08 (×3): qty 1

## 2014-11-08 MED ORDER — SIMETHICONE 80 MG PO CHEW
80.0000 mg | CHEWABLE_TABLET | ORAL | Status: DC
  Administered 2014-11-08 – 2014-11-10 (×3): 80 mg via ORAL
  Filled 2014-11-08 (×3): qty 1

## 2014-11-08 MED ORDER — OXYTOCIN 10 UNIT/ML IJ SOLN
40.0000 [IU] | INTRAMUSCULAR | Status: DC | PRN
  Administered 2014-11-08: 40 [IU] via INTRAVENOUS

## 2014-11-08 MED ORDER — NALBUPHINE HCL 10 MG/ML IJ SOLN: 5.0000 mg | Freq: Once | INTRAMUSCULAR | Status: AC | PRN

## 2014-11-08 MED ORDER — LACTATED RINGERS IV SOLN
INTRAVENOUS | Status: DC
Start: 2014-11-08 — End: 2014-11-11
  Administered 2014-11-08: 21:00:00 via INTRAVENOUS

## 2014-11-08 MED ORDER — MEPERIDINE HCL 25 MG/ML IJ SOLN: 6.2500 mg | INTRAMUSCULAR | Status: DC | PRN

## 2014-11-08 MED ORDER — KETAMINE HCL 10 MG/ML IJ SOLN
INTRAMUSCULAR | Status: DC | PRN
  Administered 2014-11-08 (×3): 10 mg via INTRAVENOUS

## 2014-11-08 MED ORDER — SCOPOLAMINE 1 MG/3DAYS TD PT72
MEDICATED_PATCH | TRANSDERMAL | Status: AC
  Filled 2014-11-08: qty 1

## 2014-11-08 MED ORDER — ONDANSETRON HCL 4 MG/2ML IJ SOLN: 4.0000 mg | Freq: Once | INTRAMUSCULAR | Status: DC | PRN

## 2014-11-08 MED ORDER — BUPIVACAINE HCL (PF) 0.75 % IJ SOLN
INTRAMUSCULAR | Status: AC
  Filled 2014-11-08: qty 30

## 2014-11-08 MED ORDER — CHLOROPROCAINE HCL (PF) 3 % IJ SOLN
INTRAMUSCULAR | Status: DC | PRN
  Administered 2014-11-08: 20 mL

## 2014-11-08 MED ORDER — DIPHENHYDRAMINE HCL 25 MG PO CAPS: 25.0000 mg | ORAL_CAPSULE | ORAL | Status: DC | PRN

## 2014-11-08 MED ORDER — LACTATED RINGERS IV SOLN: INTRAVENOUS | Status: DC

## 2014-11-08 MED ORDER — FENTANYL CITRATE (PF) 100 MCG/2ML IJ SOLN
INTRAMUSCULAR | Status: DC | PRN
  Administered 2014-11-08: 10 ug via INTRATHECAL
  Administered 2014-11-08: 50 ug via INTRAVENOUS
  Administered 2014-11-08: 40 ug via INTRAVENOUS

## 2014-11-08 MED ORDER — NALBUPHINE HCL 10 MG/ML IJ SOLN
INTRAMUSCULAR | Status: AC
  Filled 2014-11-08: qty 1

## 2014-11-08 MED ORDER — PRENATAL MULTIVITAMIN CH
1.0000 | ORAL_TABLET | Freq: Every day | ORAL | Status: DC
  Administered 2014-11-09 – 2014-11-10 (×2): 1 via ORAL
  Filled 2014-11-08 (×2): qty 1

## 2014-11-08 MED ORDER — NALOXONE HCL 0.4 MG/ML IJ SOLN: 0.4000 mg | INTRAMUSCULAR | Status: DC | PRN

## 2014-11-08 MED ORDER — DEXTROSE 5 % IV SOLN
1.0000 ug/kg/h | INTRAVENOUS | Status: DC | PRN
  Filled 2014-11-08: qty 2

## 2014-11-08 MED ORDER — FENTANYL CITRATE (PF) 100 MCG/2ML IJ SOLN
INTRAMUSCULAR | Status: AC
  Filled 2014-11-08: qty 4

## 2014-11-08 MED ORDER — ONDANSETRON HCL 4 MG/2ML IJ SOLN
INTRAMUSCULAR | Status: AC
  Filled 2014-11-08: qty 2

## 2014-11-08 MED ORDER — MIDAZOLAM HCL 2 MG/2ML IJ SOLN
INTRAMUSCULAR | Status: AC
  Filled 2014-11-08: qty 4

## 2014-11-08 MED ORDER — ONDANSETRON HCL 4 MG/2ML IJ SOLN
INTRAMUSCULAR | Status: DC | PRN
  Administered 2014-11-08: 4 mg via INTRAVENOUS

## 2014-11-08 MED ORDER — DIPHENHYDRAMINE HCL 25 MG PO CAPS: 25.0000 mg | ORAL_CAPSULE | Freq: Four times a day (QID) | ORAL | Status: DC | PRN

## 2014-11-08 MED ORDER — GENTAMICIN SULFATE 40 MG/ML IJ SOLN
INTRAVENOUS | Status: AC
  Administered 2014-11-08: 113 mL via INTRAVENOUS
  Filled 2014-11-08: qty 7.25

## 2014-11-08 MED ORDER — DIPHENHYDRAMINE HCL 50 MG/ML IJ SOLN: 12.5000 mg | INTRAMUSCULAR | Status: DC | PRN

## 2014-11-08 MED ORDER — SODIUM CHLORIDE 0.9 % IR SOLN
Status: DC | PRN
  Administered 2014-11-08: 600 mL

## 2014-11-08 MED ORDER — VITAMIN K1 1 MG/0.5ML IJ SOLN
INTRAMUSCULAR | Status: AC
  Filled 2014-11-08: qty 0.5

## 2014-11-08 MED ORDER — PHENYLEPHRINE 8 MG IN D5W 100 ML (0.08MG/ML) PREMIX OPTIME
INJECTION | INTRAVENOUS | Status: DC | PRN
  Administered 2014-11-08: 60 ug/min via INTRAVENOUS

## 2014-11-08 MED ORDER — LACTATED RINGERS IV SOLN
125.0000 mL/h | INTRAVENOUS | Status: DC
  Administered 2014-11-08: 125 mL/h via INTRAVENOUS

## 2014-11-08 MED ORDER — CEFAZOLIN SODIUM-DEXTROSE 2-3 GM-% IV SOLR: 2.0000 g | INTRAVENOUS | Status: DC

## 2014-11-08 MED ORDER — DIBUCAINE 1 % RE OINT: 1.0000 "application " | TOPICAL_OINTMENT | RECTAL | Status: DC | PRN

## 2014-11-08 MED ORDER — MONTELUKAST SODIUM 10 MG PO TABS
10.0000 mg | ORAL_TABLET | Freq: Every day | ORAL | Status: DC
  Administered 2014-11-08 – 2014-11-10 (×3): 10 mg via ORAL
  Filled 2014-11-08 (×4): qty 1

## 2014-11-08 MED ORDER — NALBUPHINE HCL 10 MG/ML IJ SOLN: 5.0000 mg | INTRAMUSCULAR | Status: DC | PRN

## 2014-11-08 MED ORDER — OXYCODONE HCL 5 MG PO TABS
10.0000 mg | ORAL_TABLET | ORAL | Status: DC | PRN
  Administered 2014-11-08 – 2014-11-11 (×15): 10 mg via ORAL
  Filled 2014-11-08 (×14): qty 2

## 2014-11-08 MED ORDER — ZOLPIDEM TARTRATE 5 MG PO TABS: 5.0000 mg | ORAL_TABLET | Freq: Every evening | ORAL | Status: DC | PRN

## 2014-11-08 MED ORDER — FENTANYL CITRATE (PF) 100 MCG/2ML IJ SOLN
INTRAMUSCULAR | Status: AC
  Filled 2014-11-08: qty 2

## 2014-11-08 MED ORDER — PHENYLEPHRINE 8 MG IN D5W 100 ML (0.08MG/ML) PREMIX OPTIME
INJECTION | INTRAVENOUS | Status: AC
  Filled 2014-11-08: qty 100

## 2014-11-08 MED ORDER — ONDANSETRON HCL 4 MG/2ML IJ SOLN: 4.0000 mg | Freq: Three times a day (TID) | INTRAMUSCULAR | Status: DC | PRN

## 2014-11-08 MED ORDER — ALBUTEROL SULFATE (2.5 MG/3ML) 0.083% IN NEBU: 2.5000 mg | INHALATION_SOLUTION | RESPIRATORY_TRACT | Status: DC | PRN

## 2014-11-08 MED ORDER — NALBUPHINE HCL 10 MG/ML IJ SOLN
5.0000 mg | Freq: Once | INTRAMUSCULAR | Status: AC | PRN
  Administered 2014-11-08: 5 mg via SUBCUTANEOUS

## 2014-11-08 MED ORDER — ALBUTEROL SULFATE (2.5 MG/3ML) 0.083% IN NEBU: 3.0000 mL | INHALATION_SOLUTION | RESPIRATORY_TRACT | Status: DC | PRN

## 2014-11-08 MED ORDER — SCOPOLAMINE 1 MG/3DAYS TD PT72
1.0000 | MEDICATED_PATCH | Freq: Once | TRANSDERMAL | Status: DC
  Administered 2014-11-08: 1.5 mg via TRANSDERMAL

## 2014-11-08 MED ORDER — MENTHOL 3 MG MT LOZG
1.0000 | LOZENGE | OROMUCOSAL | Status: DC | PRN
Start: 2014-11-08 — End: 2014-11-11

## 2014-11-08 MED ORDER — SCOPOLAMINE 1 MG/3DAYS TD PT72: 1.0000 | MEDICATED_PATCH | Freq: Once | TRANSDERMAL | Status: DC

## 2014-11-08 MED ORDER — ERYTHROMYCIN 5 MG/GM OP OINT
TOPICAL_OINTMENT | OPHTHALMIC | Status: AC
  Filled 2014-11-08: qty 1

## 2014-11-08 MED ORDER — MORPHINE SULFATE (PF) 0.5 MG/ML IJ SOLN
INTRAMUSCULAR | Status: DC | PRN
  Administered 2014-11-08: 2 mg via INTRAVENOUS
  Administered 2014-11-08: .2 mg via INTRATHECAL

## 2014-11-08 MED ORDER — LACTATED RINGERS IV SOLN
INTRAVENOUS | Status: DC | PRN
  Administered 2014-11-08 (×3): via INTRAVENOUS

## 2014-11-08 MED ORDER — BUPIVACAINE IN DEXTROSE 0.75-8.25 % IT SOLN
INTRATHECAL | Status: DC | PRN
  Administered 2014-11-08: 1.6 mL via INTRATHECAL

## 2014-11-08 MED ORDER — TETANUS-DIPHTH-ACELL PERTUSSIS 5-2.5-18.5 LF-MCG/0.5 IM SUSP: 0.5000 mL | Freq: Once | INTRAMUSCULAR | Status: DC

## 2014-11-08 MED ORDER — WITCH HAZEL-GLYCERIN EX PADS: 1.0000 "application " | MEDICATED_PAD | CUTANEOUS | Status: DC | PRN

## 2014-11-08 MED ORDER — SODIUM CHLORIDE 0.9 % IJ SOLN: 3.0000 mL | INTRAMUSCULAR | Status: DC | PRN

## 2014-11-08 MED ORDER — MIDAZOLAM HCL 2 MG/2ML IJ SOLN
INTRAMUSCULAR | Status: DC | PRN
  Administered 2014-11-08: 2 mg via INTRAVENOUS

## 2014-11-08 MED ORDER — MORPHINE SULFATE (PF) 0.5 MG/ML IJ SOLN
INTRAMUSCULAR | Status: AC
  Filled 2014-11-08: qty 100

## 2014-11-08 SURGICAL SUPPLY — 38 items
BENZOIN TINCTURE PRP APPL 2/3 (GAUZE/BANDAGES/DRESSINGS) ×6 IMPLANT
CATH ROBINSON RED A/P 16FR (CATHETERS) IMPLANT
CLAMP CORD UMBIL (MISCELLANEOUS) IMPLANT
CLOSURE WOUND 1/2 X4 (GAUZE/BANDAGES/DRESSINGS) ×1
CLOTH BEACON ORANGE TIMEOUT ST (SAFETY) ×3 IMPLANT
DRAPE SHEET LG 3/4 BI-LAMINATE (DRAPES) IMPLANT
DRSG OPSITE POSTOP 4X10 (GAUZE/BANDAGES/DRESSINGS) ×3 IMPLANT
DURAPREP 26ML APPLICATOR (WOUND CARE) ×3 IMPLANT
ELECT REM PT RETURN 9FT ADLT (ELECTROSURGICAL) ×3
ELECTRODE REM PT RTRN 9FT ADLT (ELECTROSURGICAL) ×1 IMPLANT
EXTRACTOR VACUUM M CUP 4 TUBE (SUCTIONS) IMPLANT
EXTRACTOR VACUUM M CUP 4' TUBE (SUCTIONS)
GAUZE SPONGE 4X4 12PLY STRL (GAUZE/BANDAGES/DRESSINGS) ×2 IMPLANT
GLOVE BIOGEL PI IND STRL 7.5 (GLOVE) ×2 IMPLANT
GLOVE BIOGEL PI INDICATOR 7.5 (GLOVE) ×4
GLOVE ECLIPSE 7.5 STRL STRAW (GLOVE) ×3 IMPLANT
GOWN STRL REUS W/TWL LRG LVL3 (GOWN DISPOSABLE) ×9 IMPLANT
KIT ABG SYR 3ML LUER SLIP (SYRINGE) IMPLANT
NEEDLE HYPO 25X5/8 SAFETYGLIDE (NEEDLE) IMPLANT
NS IRRIG 1000ML POUR BTL (IV SOLUTION) ×3 IMPLANT
PACK C SECTION WH (CUSTOM PROCEDURE TRAY) ×3 IMPLANT
PAD ABD 8X7 1/2 STERILE (GAUZE/BANDAGES/DRESSINGS) ×6 IMPLANT
PAD OB MATERNITY 4.3X12.25 (PERSONAL CARE ITEMS) ×3 IMPLANT
PENCIL SMOKE EVAC W/HOLSTER (ELECTROSURGICAL) ×3 IMPLANT
RETAINER VISCERAL (MISCELLANEOUS) ×3 IMPLANT
RTRCTR C-SECT PINK 25CM LRG (MISCELLANEOUS) ×3 IMPLANT
SPONGE GAUZE 4X4 12PLY (GAUZE/BANDAGES/DRESSINGS) ×3 IMPLANT
STRIP CLOSURE SKIN 1/2X4 (GAUZE/BANDAGES/DRESSINGS) ×2 IMPLANT
SUT MNCRL 0 VIOLET CTX 36 (SUTURE) IMPLANT
SUT MONOCRYL 0 CTX 36 (SUTURE)
SUT VIC AB 0 CTX 36 (SUTURE) ×6
SUT VIC AB 0 CTX36XBRD ANBCTRL (SUTURE) ×3 IMPLANT
SUT VIC AB 2-0 CT1 27 (SUTURE) ×2
SUT VIC AB 2-0 CT1 TAPERPNT 27 (SUTURE) ×1 IMPLANT
SUT VIC AB 4-0 KS 27 (SUTURE) ×3 IMPLANT
TAPE CLOTH SURG 4X10 WHT LF (GAUZE/BANDAGES/DRESSINGS) ×3 IMPLANT
TOWEL OR 17X24 6PK STRL BLUE (TOWEL DISPOSABLE) ×3 IMPLANT
TRAY FOLEY CATH SILVER 14FR (SET/KITS/TRAYS/PACK) ×3 IMPLANT

## 2014-11-08 NOTE — Op Note (Signed)
Dania Holle PROCEDURE DATE: 11/08/2014  PREOPERATIVE DIAGNOSIS: Intrauterine pregnancy at  [redacted]w[redacted]d weeks gestation; previous uterine incision kerr  POSTOPERATIVE DIAGNOSIS: The same  PROCEDURE: Repeat Low Transverse Cesarean Section  SURGEON:  Dr. Loma Boston  ASSISTANT: Dr Keane Scrape, PGY1  INDICATIONS: Tzirel Leonor is a 24 y.o. G3P3003 at [redacted]w[redacted]d scheduled for cesarean section secondary to previous uterine incision kerr.  The risks of cesarean section discussed with the patient included but were not limited to: bleeding which may require transfusion or reoperation; infection which may require antibiotics; injury to bowel, bladder, ureters or other surrounding organs; injury to the fetus; need for additional procedures including hysterectomy in the event of a life-threatening hemorrhage; placental abnormalities wth subsequent pregnancies, incisional problems, thromboembolic phenomenon and other postoperative/anesthesia complications. The patient concurred with the proposed plan, giving informed written consent for the procedure.    FINDINGS:  Viable female infant in vertex presentation.  Apgars 8 and 9, weight pending.  Clear amniotic fluid.  Intact placenta, three vessel cord.  Normal uterus, fallopian tubes and right ovary.  5cm left simple ovarian cyst.  ANESTHESIA:    Spinal INTRAVENOUS FLUIDS:2200 ml ESTIMATED BLOOD LOSS: 700 ml URINE OUTPUT:  200 ml SPECIMENS: Placenta sent to L&D COMPLICATIONS: None immediate  PROCEDURE IN DETAIL:  The patient received intravenous antibiotics and had sequential compression devices applied to her lower extremities while in the preoperative area.  She was then taken to the operating room where spinal anesthesia was administered and was found to be adequate. She was then placed in a dorsal supine position with a leftward tilt, and prepped and draped in a sterile manner.  A foley catheter was placed into her bladder and attached to constant  gravity, which drained clear fluid throughout.  After an adequate timeout was performed, a Pfannenstiel skin incision was made with scalpel and carried through to the underlying layer of fascia. The fascia was incised in the midline and this incision was extended bilaterally using the Mayo scissors. Kocher clamps were applied to the superior aspect of the fascial incision and the underlying rectus muscles were dissected off bluntly and with cautery. A similar process was carried out on the inferior aspect of the facial incision. The rectus muscles were separated in the midline bluntly and the peritoneum was entered bluntly. An Alexis retractor was placed to aid in visualization of the uterus.  Attention was turned to the lower uterine segment where a transverse hysterotomy was made with a scalpel and extended bilaterally bluntly. The infant was successfully delivered, and cord was clamped and cut and infant was handed over to awaiting neonatology team. Uterine massage was then administered and the placenta delivered intact with three-vessel cord. The uterus was then cleared of clot and debris.  The hysterotomy was closed with 0 Vicryl in a running locked fashion, and an imbricating layer was also placed with a 0 Monocryl. Overall, excellent hemostasis was noted. The abdomen and the pelvis were cleared of all clot and debris and the Ubaldo Glassing was removed.  Examination of the fallopian tubes and ovaries revealed a 5cm left simple ovarian cyst, which was incised with cautery and drained.  Hemostasis was confirmed on all surfaces.  The rectus muscles and peritoneum was reapproximated using 2-0 vicryl interrupted stitches. The fascia was then closed using 0 Vicryl in a running fashion.  The skin was closed with 4-0 vicryl. The patient tolerated the procedure well. Sponge, lap, instrument and needle counts were correct x 2. She was taken to the recovery  room in stable condition.    Truett Mainland, DO 11/08/2014 10:37  AM

## 2014-11-08 NOTE — Lactation Note (Signed)
This note was copied from the chart of Jasmine Maghen Andres. Lactation Consultation Note; Mom asking for bottle of formula. Offered assist with latch and mom agreeable. Baby took a few sucks, then off to sleep. Encouraged frequent breast feeding to promote a good milk supply. BF brochure given with resources for support after DC. No questions at present. To call for assist prn  Patient Name: Jasmine [name] [id number] Date: 11/08/2014 Reason for consult: Initial assessment   Maternal Data Formula Feeding for Exclusion: No Does the patient have breastfeeding experience prior to this delivery?: Yes  Feeding Feeding Type: Breast Fed Length of feed: 3 min  LATCH Score/Interventions Latch: Repeated attempts needed to sustain latch, nipple held in mouth throughout feeding, stimulation needed to elicit sucking reflex. Intervention(s): Assist with latch;Adjust position;Breast compression  Audible Swallowing: None Intervention(s): Hand expression;Skin to skin  Type of Nipple: Everted at rest and after stimulation  Comfort (Breast/Nipple): Soft / non-tender     Hold (Positioning): Assistance needed to correctly position infant at breast and maintain latch. Intervention(s): Breastfeeding basics reviewed  LATCH Score: 6  Lactation Tools Discussed/Used     Consult Status Consult Status: Follow-up Date: 11/09/14 Follow-up type: In-patient    Truddie Crumble 11/08/2014, 3:30 PM

## 2014-11-08 NOTE — Anesthesia Preprocedure Evaluation (Addendum)
Anesthesia Evaluation  Patient identified by MRN, date of birth, ID band Patient awake    Reviewed: Allergy & Precautions, NPO status , Patient's Chart, lab work & pertinent test results  History of Anesthesia Complications Negative for: history of anesthetic complications  Airway Mallampati: III  TM Distance: >3 FB Neck ROM: Full    Dental no notable dental hx. (+) Dental Advisory Given, Poor Dentition, Chipped   Pulmonary asthma (hx of requiring intubation in 2012 for asthma, no major problems since, took albuterol this am, lungs CTA bilaterally) , former smoker,    Pulmonary exam normal breath sounds clear to auscultation       Cardiovascular negative cardio ROS Normal cardiovascular exam+ Valvular Problems/Murmurs (murmur as a child)  Rhythm:Regular Rate:Normal     Neuro/Psych Seizures - (Takes keppra daily, no sz since 2012), Well Controlled,  PSYCHIATRIC DISORDERS (Panic attack during last C/S) Anxiety    GI/Hepatic Neg liver ROS, GERD  Medicated and Controlled,  Endo/Other  negative endocrine ROS  Renal/GU negative Renal ROS  negative genitourinary   Musculoskeletal negative musculoskeletal ROS (+)   Abdominal   Peds negative pediatric ROS (+)  Hematology negative hematology ROS (+)   Anesthesia Other Findings   Reproductive/Obstetrics (+) Pregnancy                           Anesthesia Physical Anesthesia Plan  ASA: II  Anesthesia Plan: Spinal   Post-op Pain Management:    Induction:   Airway Management Planned:   Additional Equipment:   Intra-op Plan:   Post-operative Plan:   Informed Consent: I have reviewed the patients History and Physical, chart, labs and discussed the procedure including the risks, benefits and alternatives for the proposed anesthesia with the patient or authorized representative who has indicated his/her understanding and acceptance.   Dental  advisory given  Plan Discussed with: CRNA  Anesthesia Plan Comments:         Anesthesia Quick Evaluation

## 2014-11-08 NOTE — H&P (Signed)
Faculty Practice H&P  Jasmine [name] is a 24 y.o. female G3P2002 with IUP at [redacted]w[redacted]d presenting for elective repeat cesarean section.  Pregnancy was been complicated by Seizure disorder, asthma, abnormal quad screen.    Pt states she has been having no contractions, no vaginal bleeding, intact membranes, with normal fetal movement.     Prenatal Course Source of Care: [facility]  with onset of care at 13 weeks 5 days Pregnancy complications or risks: Patient Active Problem List   Diagnosis Date Noted  . Previous cesarean section complicating pregnancy, antepartum condition or complication     Priority: High  . Seizure disorder during pregnancy, antepartum ([place]) 05/13/2014    Priority: High  . Supervision of high risk pregnancy, antepartum 05/13/2014    Priority: High  . Asthma complicating pregnancy, antepartum 05/13/2014    Priority: High  . GERD (gastroesophageal reflux disease)   . Gastroesophageal reflux disease without esophagitis   . Abnormal biochemical finding on antenatal screening of mother   . Abnormal quad screen   . Echogenic focus of heart of fetus affecting antepartum care of mother   . Drug use affecting pregnancy, antepartum 05/19/2014  . History of food anaphylaxis 05/13/2014  . Seizure disorder, sept 2015 last seizure 01/19/2013   She desires to nexplanon.  She plans to plans to breastfeed  Prenatal labs and studies: ABO, Rh: --/--/B POS, B POS (10/21 0930) Antibody: NEG (10/21 0930) Rubella:   RPR: Non Reactive (10/21 0930)  HBsAg: NEGATIVE (04/28 1356)  HIV: NONREACTIVE (08/18 1420)  GBS:    1 hr Glucola 95 Genetic screeningabnormal with elevated Quad, normal panorama Anatomy US abnormal with echogenic focus LV  Past Medical History:  Past Medical History  Diagnosis Date  . Asthma   . Sickle cell trait ([place])   . Pancreatitis   . GERD (gastroesophageal reflux disease)   . Seizures ([place])     epilepsy  . Heart murmur     last work up age 48- no symtoms     Past Surgical History:  Past Surgical History  Procedure Laterality Date  . Nerve, tendon and artery repair Right 09/23/2012    Procedure: I&D and Repair As Necessary/Right Hand and Palm;  Surgeon: Roseanne Kaufman, MD;  Location: [place];  Service: Orthopedics;  Laterality: Right;  . Tonsillectomy    . Hernia repair    . Cesarean section      x 1  . Hand surgery    . Appendectomy      Obstetrical History:  OB History    Gravida Para Term Preterm AB TAB SAB Ectopic Multiple Living   3 2 2       2       Gynecological History:  OB History    Gravida Para Term Preterm AB TAB SAB Ectopic Multiple Living   3 2 2       2       Social History:  Social History   Social History  . Marital Status: Single    Spouse Name: N/A  . Number of Children: N/A  . Years of Education: N/A   Social History Main Topics  . Smoking status: Former Smoker    Types: Cigarettes    Quit date: 03/27/2014  . Smokeless tobacco: Never Used  . Alcohol Use: No  . Drug Use: No  . Sexual Activity: Yes    Birth Control/ Protection: None   Other Topics Concern  . Not on file   Social History Narrative  Family History:  Family History  Problem Relation Age of Onset  . Cancer Mother     cervical cancer  . Asthma Mother   . Hypertension Father   . Sickle cell anemia Father   . Asthma Sister   . Diabetes Maternal Aunt   . Cancer Maternal Grandmother     Medications:  Prenatal vitamins,  Current Facility-Administered Medications  Medication Dose Route Frequency Provider Last Rate Last Dose  . gentamicin (GARAMYCIN) 400 mg, clindamycin (CLEOCIN) 900 mg in dextrose 5 % 100 mL IVPB   Intravenous On Call to [place], DO      . lactated ringers infusion   Intravenous Continuous Lyn Hollingshead, MD      . lactated ringers infusion  125 mL/hr Intravenous Continuous Truett Mainland, DO 125 mL/hr at 11/08/14 0819 125 mL/hr at 11/08/14 0819  . scopolamine (TRANSDERM-SCOP) 1 MG/3DAYS 1.5 mg   1 patch Transdermal Once Lyn Hollingshead, MD   1.5 mg at 11/08/14 7829    Allergies:  Allergies  Allergen Reactions  . Avocado Anaphylaxis  . Cheese Shortness Of Breath    Asthma trigger  . Chocolate Shortness Of Breath    Asthma trigger  . Ibuprofen Hives and Shortness Of Breath    Tolerates naproxen  . Orange Juice [Orange Oil] Shortness Of Breath  . Other Hives    ALLERGIC TO ALL STEROIDS       EXCEPT IV SOLU MEDROL  . Peach [Prunus Persica] Anaphylaxis  . Peanuts [Peanut Oil] Anaphylaxis  . Pear Anaphylaxis  . Prednisone Hives  . Raspberry Anaphylaxis  . Tylenol [Acetaminophen] Hives and Shortness Of Breath  . Amoxicillin Hives  . Doxycycline Hives  . Erythromycin Hives  . Ivp Dye [Iodinated Diagnostic Agents]     Shortness of breath   . Latex     Swelling/anaphylaxis  . Milk Of Magnesia [Magnesium Hydroxide] Hives and Itching  . Penicillins Hives    Has patient had a PCN reaction causing immediate rash, facial/tongue/throat swelling, SOB or lightheadedness with hypotension: Yes Has patient had a PCN reaction causing severe rash involving mucus membranes or skin necrosis: No Has patient had a PCN reaction that required hospitalization Yes Has patient had a PCN reaction occurring within the last 10 years: No If all of the above answers are "NO", then may proceed with Cephalosporin use.     Review of Systems: - negative  Physical Exam: Blood pressure 125/72, pulse 74, temperature 97.9 F (36.6 C), temperature source Oral, last menstrual period 01/24/2014, SpO2 100 %. GENERAL: Well-developed, well-nourished female in no acute distress.  LUNGS: Clear to auscultation bilaterally.  HEART: Regular rate and rhythm. ABDOMEN: Soft, nontender, nondistended, gravid. EFW 7 lbs EXTREMITIES: Nontender, no edema, 2+ distal pulses. Presentation: cephalic FHT:  Baseline rate 136 bpm    Pertinent Labs/Studies:   Results for orders placed or performed during the hospital  encounter of 11/05/14 (from the past 72 hour(s))  CBC     Status: Abnormal   Collection Time: 11/05/14  9:30 AM  Result Value Ref Range   WBC 12.4 (H) 4.0 - 10.5 K/uL   RBC 4.29 3.87 - 5.11 MIL/uL   Hemoglobin 12.0 12.0 - 15.0 g/dL   HCT 36.7 36.0 - 46.0 %   MCV 85.5 78.0 - 100.0 fL   MCH 28.0 26.0 - 34.0 pg   MCHC 32.7 30.0 - 36.0 g/dL   RDW 14.4 11.5 - 15.5 %   Platelets 200 150 - 400 K/uL  Rapid HIV screen (HIV 1/2 Ab+Ag)     Status: None   Collection Time: 11/05/14  9:30 AM  Result Value Ref Range   HIV-1 P24 Antigen - HIV24 NON REACTIVE NON REACTIVE   HIV 1/2 Antibodies NON REACTIVE NON REACTIVE   Interpretation (HIV Ag Ab)      A non reactive test result means that HIV 1 or HIV 2 antibodies and HIV 1 p24 antigen were not detected in the specimen.  RPR     Status: None   Collection Time: 11/05/14  9:30 AM  Result Value Ref Range   RPR Ser Ql Non Reactive Non Reactive    Comment: (NOTE) Performed At: [facility] [place], [place] [id number] Lindon Romp MD EK:[id number]   Type and screen     Status: None   Collection Time: 11/05/14  9:30 AM  Result Value Ref Range   ABO/RH(D) B POS    Antibody Screen NEG    Sample Expiration 11/08/2014   ABO/Rh     Status: None   Collection Time: 11/05/14  9:30 AM  Result Value Ref Range   ABO/RH(D) B POS      Assessment : Jasmine [name] is a 24 y.o. G3P2002 at [redacted]w[redacted]d being admitted for cesarean section secondary to elective repeat  Plan: The risks of cesarean section discussed with the patient included but were not limited to: bleeding which may require transfusion or reoperation; infection which may require antibiotics; injury to bowel, bladder, ureters or other surrounding organs; injury to the fetus; need for additional procedures including hysterectomy in the event of a life-threatening hemorrhage; placental abnormalities wth subsequent pregnancies, incisional problems, thromboembolic phenomenon and  other postoperative/anesthesia complications. The patient concurred with the proposed plan, giving informed written consent for the procedure.   Patient has been NPO since midnight and will remain NPO for procedure.  Preoperative prophylactic Clindamycin and gentamycin ordered on call to the OR.    Truett Mainland, DO 11/08/2014, 8:48 AM

## 2014-11-08 NOTE — Progress Notes (Signed)
Reported pt pain of 9. Pt requests  Roxicodone for pain  Review PACU and OR pain meds given to pt. Initially(at 1215) pt was drowsy.  Currently pt is awake, alert,  and orient, VS stable, urine output 250 over 2.5hrs.Pt has tolerated juice, crackers and PO Keppra, no nausea.  Dr Jillyn Hidden states okay to give 2 tabs OxyIR Roxicodone (10mg ) Now.

## 2014-11-08 NOTE — Anesthesia Postprocedure Evaluation (Signed)
  Anesthesia Post-op Note  Patient: Jasmine [name]  Procedure(s) Performed: Procedure(s) (LRB): CESAREAN SECTION (N/A)  Patient Location: PACU  Anesthesia Type: Epidural  Level of Consciousness: awake and alert   Airway and Oxygen Therapy: Patient Spontanous Breathing  Post-op Pain: mild  Post-op Assessment: Post-op Vital signs reviewed, Patient's Cardiovascular Status Stable, Respiratory Function Stable, Patent Airway and No signs of Nausea or vomiting  Last Vitals:  Filed Vitals:   11/08/14 1214  BP: 108/63  Pulse: 59  Temp: 37 C  Resp: 16    Post-op Vital Signs: stable   Complications: No apparent anesthesia complications

## 2014-11-08 NOTE — Anesthesia Procedure Notes (Signed)
Spinal Patient location during procedure: OR Staffing Anesthesiologist: Onelia Cadmus Performed by: anesthesiologist  Preanesthetic Checklist Completed: patient identified, site marked, surgical consent, pre-op evaluation, timeout performed, IV checked, risks and benefits discussed and monitors and equipment checked Spinal Block Patient position: sitting Prep: ChloraPrep Patient monitoring: continuous pulse ox, blood pressure and heart rate Approach: midline Location: L3-4 Injection technique: single-shot Needle Needle type: Sprotte  Needle gauge: 24 G Needle length: 9 cm Additional Notes Functioning IV was confirmed and monitors were applied. Sterile prep and drape, including hand hygiene, mask and sterile gloves were used. The patient was positioned and the spine was prepped. The skin was anesthetized with lidocaine.  Free flow of clear CSF was obtained prior to injecting local anesthetic into the CSF.  The spinal needle aspirated freely following injection.  The needle was carefully withdrawn.  The patient tolerated the procedure well. Consent was obtained prior to procedure with all questions answered and concerns addressed. Risks including but not limited to bleeding, infection, nerve damage, paralysis, failed block, inadequate analgesia, allergic reaction, high spinal, itching and headache were discussed and the patient wished to proceed.   Marigrace Mccole, MD     

## 2014-11-08 NOTE — Transfer of Care (Signed)
Immediate Anesthesia Transfer of Care Note  Patient: Jasmine [name]  Procedure(s) Performed: Procedure(s): CESAREAN SECTION (N/A)  Patient Location: PACU  Anesthesia Type:Spinal  Level of Consciousness: awake, alert , oriented and patient cooperative  Airway & Oxygen Therapy: Patient Spontanous Breathing  Post-op Assessment: Report given to RN and Post -op Vital signs reviewed and stable  Post vital signs: Reviewed and stable  Last Vitals:  Filed Vitals:   11/08/14 0754  BP: 125/72  Pulse: 74  Temp: 38.3 C    Complications: No apparent anesthesia complications

## 2014-11-09 ENCOUNTER — Encounter (HOSPITAL_COMMUNITY): Payer: Self-pay | Admitting: Family Medicine

## 2014-11-09 LAB — CBC
HCT: 27.5 % — ABNORMAL LOW (ref 36.0–46.0)
Hemoglobin: 9.3 g/dL — ABNORMAL LOW (ref 12.0–15.0)
MCH: 28.9 pg (ref 26.0–34.0)
MCHC: 33.8 g/dL (ref 30.0–36.0)
MCV: 85.4 fL (ref 78.0–100.0)
PLATELETS: 178 10*3/uL (ref 150–400)
RBC: 3.22 MIL/uL — ABNORMAL LOW (ref 3.87–5.11)
RDW: 14.4 % (ref 11.5–15.5)
WBC: 11.9 10*3/uL — ABNORMAL HIGH (ref 4.0–10.5)

## 2014-11-09 NOTE — Addendum Note (Signed)
Addendum  created 11/09/14 2620 by Elenore Paddy, CRNA   Modules edited: Charges VN, Notes Section   Notes Section:  File: [id number]

## 2014-11-09 NOTE — Progress Notes (Signed)
UR chart review completed.  

## 2014-11-09 NOTE — Lactation Note (Signed)
This note was copied from the chart of Jasmine Leveda Chalk. Lactation Consultation Note  Patient Name: Jasmine [name] [id number] Date: 11/09/2014 Reason for consult: Follow-up assessment Baby at 77 hr old and has only eaten off the L breast in the last 24 hr. Mom is also pumping and offering formula. It took baby a couple of tries but she latched comfortably to the R breast. LC showed mom how to use the tea cup hold and mom was able to demonstrate a good latch by herself. Mom stated that she did not have success bf her other children and would like bf this baby for 54m. The lady in the room with her was very supportive of bf. Mom is aware of O/P lactation and support group. She will page as needed for bf help.    Maternal Data Has patient been taught Hand Expression?: Yes  Feeding Feeding Type: Breast Fed Length of feed: 15 min  LATCH Score/Interventions Latch: Repeated attempts needed to sustain latch, nipple held in mouth throughout feeding, stimulation needed to elicit sucking reflex. Intervention(s): Adjust position;Assist with latch  Audible Swallowing: Spontaneous and intermittent Intervention(s): Skin to skin Intervention(s): Alternate breast massage  Type of Nipple: Everted at rest and after stimulation  Comfort (Breast/Nipple): Soft / non-tender     Hold (Positioning): Assistance needed to correctly position infant at breast and maintain latch. Intervention(s): Support Pillows;Position options  LATCH Score: 8  Lactation Tools Discussed/Used WIC Program: Yes   Consult Status Consult Status: Follow-up Date: 11/10/14 Follow-up type: In-patient    Denzil Hughes 11/09/2014, 7:54 PM

## 2014-11-09 NOTE — Clinical Social Work Maternal (Signed)
CLINICAL SOCIAL WORK MATERNAL/CHILD NOTE  Patient Details  Name: Jasmine [name] MRN: [id number] Date of Birth: 02/27/1990  Date:  11/09/2014  Clinical Social Worker Initiating Note:  Lucita Ferrara MSW, LCSW Date/ Time Initiated:  11/09/14/1000     Child's Name:  Jasmine [name]   Legal Guardian:  Jilda Roche and Richardson Landry  Need for Interpreter:  None   Date of Referral:  11/08/14     Reason for Referral:  Current Substance Use/Substance Use During Pregnancy  (marijuana use)   Referral Source:  [facility]   Address:  [address], [place] [address]  Phone number:  [phone]   Household Members:  Minor Children, Siblings, Parents   Natural Supports (not living in the home):  Spouse/significant other, Immediate Family   Professional Supports: None   Employment: Part-time   Type of Work: Music therapist   Education:  Database administrator Resources:  Medicaid   Other Resources:  Physicist, medical , [place] Considerations Which May Impact Care:  None reported  Strengths:  Ability to meet basic needs , Pediatrician chosen , Home prepared for child    Risk Factors/Current Problems:   1)Substance Use: MOB presents with history of THC use during the pregnancy (+UDS in April).  Infant's UDS is negative and MDS is pending.  Cognitive State:  Able to Concentrate , Alert , Insightful , Linear Thinking    Mood/Affect:  Happy , Comfortable , Calm    CSW Assessment:  CSW received request for consult due to MOB presenting with a history of THC use during the pregnancy.  MOB provided consent for her sister to remain in the room during the assessment.  MOB stated that she was tired and was experiencing poorly controlled pain, but became more engaged and displayed brighter affect as assessment continued.    MOB endorsed feelings of excitement secondary to the birth of this infant. MOB denied questions, concerns, or needs as she transitions postpartum  since she reported that she has a strong support system. MOB reported that she lives with her sister and her mother, and that the FOB currently lives in [place]. [place] stated that the FOB was not able to make it to the infant's birth, but he has been involved via Facetime.  MOB stated that she and her daughters moved to [place] 2 years ago due to cost of living, and reported that she and the FOB utilize the bus and train in order to visit each other on the weekends.  Per MOB, she has no plans to move back to [place] in the future, and stated that she hopes to be able to go to school in the future to pursue a degree in radiation therapy.  MOB stated that she is currently on maternity leave from her job at [employer], and intends to return to work when able.  MOB denied mental health concerns during the pregnancy, and denied history of perinatal mood disorders.  MOB presented as attentive and engaged as the CSW provided education perinatal mood disorders, and MOB agreed to follow up with her medical provider if she notes onset of symptoms.   MOB originally denied any substance use during the pregnancy until CSW informed her of her positive UDS in April for [facility].  MOB then acknowledged THC use, and denied any additional substance use during the pregnancy.  MOB stated that she stopped all THC use once she learned that she was pregnancy, and denied any concerns/difficulties with ceasing use.  MOB verbalized understanding of the hospital drug screen policy, and denied questions/concerns related to the collection of the infant's urine and meconium.    MOB denied additional questions, concerns, or needs at this time. She agreed to contact CSW if needs arise during the admission.   CSW Plan/Description:   1)Patient/Family Education: Perinatal mood disorders, hospital drug screen policy 2) CSW to monitor infant's toxicology screens, and will make a CPS report if positive.  3)No Further Intervention Required/No Barriers to Discharge     Sharyl Nimrod 11/09/2014, 11:56 AM

## 2014-11-09 NOTE — Progress Notes (Addendum)
Post Partum Day 1/POD#1 Subjective:  Leeloo Silverthorne is a 24 y.o. [id number] [redacted]w[redacted]d s/p rLTCS (x3).  No acute events overnight.  Pt denies problems with ambulating, voiding or po intake.  She denies nausea or vomiting.  Pain is poorly controlled.  She has not had flatus.  Lochia Small.  Plan for birth control is Nexplanon.  Method of Feeding: breast  Objective: Blood pressure 101/42, pulse 63, temperature 98.8 F (37.1 C), temperature source Oral, resp. rate 18, last menstrual period 01/24/2014, SpO2 99 %, unknown if currently breastfeeding.  Physical Exam:  General: alert, cooperative and no distress Lochia:normal flow Chest: normal WOB Heart: Regular rate Abdomen: +BS, soft, mild TTP (appropriate) Uterine Fundus: firm Incision: clean/dry/intact DVT Evaluation: No evidence of DVT seen on physical exam. Extremities: trace edema  Post-operative hgb= pending  Assessment/Plan:  ASSESSMENT: Quenesha Douglass is a 24 y.o. [id number] [redacted]w[redacted]d s/p rLTCS  Plan for discharge tomorrow vs POD#3 Continue routine PP care Breastfeeding support PRN   LOS: 1 day   Caren Macadam 11/09/2014, 6:17 AM

## 2014-11-09 NOTE — Anesthesia Postprocedure Evaluation (Signed)
  Anesthesia Post-op Note  Patient: Jasmine [name]  Procedure(s) Performed: Procedure(s): CESAREAN SECTION (N/A)  Patient Location: PACU and Mother/Baby  Anesthesia Type:Spinal  Level of Consciousness: awake, alert  and oriented  Airway and Oxygen Therapy: Patient Spontanous Breathing  Post-op Pain: mild  Post-op Assessment: Patient's Cardiovascular Status Stable, Respiratory Function Stable, No signs of Nausea or vomiting, Adequate PO intake, Pain level controlled, No headache, No backache and Patient able to bend at knees              Post-op Vital Signs: Reviewed and stable  Last Vitals:  Filed Vitals:   11/09/14 0500  BP: 101/42  Pulse: 63  Temp: 37.1 C  Resp: 18    Complications: No apparent anesthesia complications

## 2014-11-10 LAB — CBC
HCT: 27 % — ABNORMAL LOW (ref 36.0–46.0)
HEMOGLOBIN: 9 g/dL — AB (ref 12.0–15.0)
MCH: 28.5 pg (ref 26.0–34.0)
MCHC: 33.3 g/dL (ref 30.0–36.0)
MCV: 85.4 fL (ref 78.0–100.0)
PLATELETS: 166 10*3/uL (ref 150–400)
RBC: 3.16 MIL/uL — ABNORMAL LOW (ref 3.87–5.11)
RDW: 14.6 % (ref 11.5–15.5)
WBC: 11.3 10*3/uL — ABNORMAL HIGH (ref 4.0–10.5)

## 2014-11-10 MED ORDER — FERROUS SULFATE 325 (65 FE) MG PO TABS
325.0000 mg | ORAL_TABLET | Freq: Every day | ORAL | Status: DC
  Administered 2014-11-10 – 2014-11-11 (×2): 325 mg via ORAL
  Filled 2014-11-10 (×2): qty 1

## 2014-11-10 MED ORDER — PNEUMOCOCCAL VAC POLYVALENT 25 MCG/0.5ML IJ INJ
0.5000 mL | INJECTION | INTRAMUSCULAR | Status: AC
  Administered 2014-11-11: 0.5 mL via INTRAMUSCULAR
  Filled 2014-11-10: qty 0.5

## 2014-11-10 NOTE — Progress Notes (Signed)
POSTPARTUM PROGRESS NOTE  Subjective:  Jasmine [name] is a 24 y.o. G3P3003 [redacted]w[redacted]d s/p RLTCS.  No acute events overnight.  Patient endorses shortness of breath when ambulating, which she attributes to pain. Complaining of moderate pain. No problems voiding or with po intake.  She denies nausea or vomiting.  P  She has had flatus. She has not had bowel movement.  Lochia Small.   Objective: Blood pressure 122/52, pulse 66, temperature 98.8 F (37.1 C), temperature source Oral, resp. rate 18, last menstrual period 01/24/2014, SpO2 98 %, unknown if currently breastfeeding.  Physical Exam:  General: alert, cooperative and no distress Lochia:normal flow Chest: CTAB Heart: RRR no m/r/g Abdomen: +BS, soft, nontender,  Uterine Fundus: firm, incision c/d/i DVT Evaluation: No calf swelling or tenderness Extremities: trace edema   Recent Labs  11/09/14 0525 11/10/14 0523  HGB 9.3* 9.0*  HCT 27.5* 27.0*    Assessment/Plan:  ASSESSMENT: Jasmine [name] is a 24 y.o. [id number] [redacted]w[redacted]d s/p rltcs. Complaining of sob this morning with ambulating. Think this likely 2/2 pain. I repeated CBC, with H of 9, from 9.3 the day prior, and 12.0 on admission. Will start oral iron. Will also have nursing ambulate patient to assess for hypoxemia. Lungs are clear, and no signs DVT, but PE always on differential in case of SOB.  Plan for discharge tomorrow   LOS: 2 days   Desma Maxim 11/10/2014, 8:09 AM

## 2014-11-10 NOTE — Progress Notes (Signed)
STAT CBC DRAWN AS ORDERED. PT WANTING TO KNOW WHAT LAB ORDERED FOR. EXPLAINED THAT DR. REQUESTED IT BE DONE NOW, TO CHECK FOR O2 PERFUSION.

## 2014-11-10 NOTE — Progress Notes (Addendum)
Pt requesting more pain medication . Explained to pt unable to give more pain medication till 0630 am.  discussed with pt the fact of she needs to move and help expel  gas build up for c/s. Pt 02 saturation 99 percent at present.plan of care discussed for pt to increase ambulation after pain medication on board. Pt nodded head . Pt is almost 48  hours out from c/s. Pt lungs clear bilaterally. Again 02 sat while lying in bed 99 percent.

## 2014-11-10 NOTE — Progress Notes (Signed)
Pt 02 sat 99 percent while pt lying in bed. Again pt encouraged to get up and walk in hallway. Pain medication given.

## 2014-11-10 NOTE — Progress Notes (Signed)
RN in room and pt connected to 02 sat monitor. Pt 02  sat at 98 percent. Pt wanting NSL OUT OF RT. HAND. EXPLAINED TO PT. THAT IV NEEDS TO STAY IN FOR ACCESS IF NEEDED IN EMERGENCY. OTHERWISE IF TAKEN OUT AND NEEDED , WOULD HAVE TO RESTART IV.

## 2014-11-11 MED ORDER — OXYCODONE HCL 5 MG PO TABS: 5.0000 mg | ORAL_TABLET | ORAL | Status: DC | PRN

## 2014-11-11 NOTE — Discharge Instructions (Signed)
Postpartum Care After Cesarean Delivery °After you deliver your newborn (postpartum period), the usual stay in the hospital is 24-72 hours. If there were problems with your labor or delivery, or if you have other medical problems, you might be in the hospital longer.  °While you are in the hospital, you will receive help and instructions on how to care for yourself and your newborn during the postpartum period.  °While you are in the hospital: °· It is normal for you to have pain or discomfort from the incision in your abdomen. Be sure to tell your nurses when you are having pain, where the pain is located, and what makes the pain worse. °· If you are breastfeeding, you may feel uncomfortable contractions of your uterus for a couple of weeks. This is normal. The contractions help your uterus get back to normal size. °· It is normal to have some bleeding after delivery. °¨ For the first 1-3 days after delivery, the flow is red and the amount may be similar to a period. °¨ It is common for the flow to start and stop. °¨ In the first few days, you may pass some small clots. Let your nurses know if you begin to pass large clots or your flow increases. °¨ Do not  flush blood clots down the toilet before having the nurse look at them. °¨ During the next 3-10 days after delivery, your flow should become more watery and pink or brown-tinged in color. °¨ Ten to fourteen days after delivery, your flow should be a small amount of yellowish-white discharge. °¨ The amount of your flow will decrease over the first few weeks after delivery. Your flow may stop in 6-8 weeks. Most women have had their flow stop by 12 weeks after delivery. °· You should change your sanitary pads frequently. °· Wash your hands thoroughly with soap and water for at least 20 seconds after changing pads, using the toilet, or before holding or feeding your newborn. °· Your intravenous (IV) tubing will be removed when you are drinking enough fluids. °· The  urine drainage tube (urinary catheter) that was inserted before delivery may be removed within 6-8 hours after delivery or when feeling returns to your legs. You should feel like you need to empty your bladder within the first 6-8 hours after the catheter has been removed. °· In case you become weak, lightheaded, or faint, call your nurse before you get out of bed for the first time and before you take a shower for the first time. °· Within the first few days after delivery, your breasts may begin to feel tender and full. This is called engorgement. Breast tenderness usually goes away within 48-72 hours after engorgement occurs. You may also notice milk leaking from your breasts. If you are not breastfeeding, do not stimulate your breasts. Breast stimulation can make your breasts produce more milk. °· Spending as much time as possible with your newborn is very important. During this time, you and your newborn can feel close and get to know each other. Having your newborn stay in your room (rooming in) will help to strengthen the bond with your newborn. It will give you time to get to know your newborn and become comfortable caring for your newborn. °· Your hormones change after delivery. Sometimes the hormone changes can temporarily cause you to feel sad or tearful. These feelings should not last more than a few days. If these feelings last longer than that, you should talk to your   caregiver. °· If desired, talk to your caregiver about methods of family planning or contraception. °· Talk to your caregiver about immunizations. Your caregiver may want you to have the following immunizations before leaving the hospital: °¨ Tetanus, diphtheria, and pertussis (Tdap) or tetanus and diphtheria (Td) immunization. It is very important that you and your family (including grandparents) or others caring for your newborn are up-to-date with the Tdap or Td immunizations. The Tdap or Td immunization can help protect your newborn  from getting ill. °¨ Rubella immunization. °¨ Varicella (chickenpox) immunization. °¨ Influenza immunization. You should receive this annual immunization if you did not receive the immunization during your pregnancy. °  °This information is not intended to replace advice given to you by your health care provider. Make sure you discuss any questions you have with your health care provider. °  °Document Released: 09/26/2011 Document Reviewed: 09/26/2011 °Elsevier Interactive Patient Education ©2016 Elsevier Inc. ° °

## 2014-11-11 NOTE — Discharge Summary (Signed)
OB Discharge Summary    Patient Name: Jasmine [name] DOB: 1990-01-25 MRN: [id number]  Date of admission: 11/08/2014 Delivering MD: Truett Mainland   Date of discharge: 11/11/2014  Admitting diagnosis: cpt 88502 - REPEAT c-section Intrauterine pregnancy: [redacted]w[redacted]d     Secondary diagnosis: Active Problems:   Status post cesarean section      Discharge diagnosis: Term Pregnancy Delivered                                                                                                Post partum procedures: none  Augmentation: none  Complications: None  Hospital course:   Jasmine [name] is a 24 y.o. [phone] with epilepsy, asthma, and sickle cell trait who presented on 11/08/2014 at [redacted]w[redacted]d for elective repeat C-section. This pregnancy was complicated by abnormal Quad screen. Patient had an uneventful cesarean and post-operative course. At 9:52 AM on 11/08/14 she delivered a vigorous 5 lb 10.3 oz baby girl via cesarean section. Pateint had an uncomplicated postpartum course and was discharged home in stable condition on 11/11/2014.  Exam at time of discharge:  Subjective: Patient reports doing well, feels ready to go home. Continues to have pain, however states it is manageable with her current pain regimen. Ambulating, voiding, and tolerating PO without issue. Denies noticing flatus, no BM since delivery. No headache, no vision changes, no chest pain, no dyspnea, no epigastric pain, no RUQ pain. H trended from 12 prior to surgery to 9 after.  Physical exam  11/10/14 1804 11/11/14 0559  BP: 126/59 128/62  Pulse: 97 75  Temp: 98.7 F (37.1 C) 98.3 F (36.8 C)  TempSrc: Oral Oral  Resp: 20 18  SpO2: 100%     GEN: alert, comfortable-appearing woman resting in hospital bed, holding baby. PULM: CTAB on frontal field exam CV: RRR, S1 and S2 heard, no M/R/G appreciated ABD: fundus firm below umbilicus. Abdomen appropriately TTP. No epigastric of RUQ pain. No guarding. INCISION:  Honeycomb dressing in place with small amount of serosanguinous drainage. No erythema, edema, or purulence noted. GU: Lochia appropriate EXTR: No LE edema or calf tenderness.   Discharge instruction: per After Visit Summary and "Baby and Me Booklet". -- Ibuprofen for pain management -- Continue prenatal vitamin -- Pelvic rest for 6 weeks -- f/u visits in 6 weeks with prenatal care provider  Medications:    Medication List    TAKE these medications        albuterol 108 (90 BASE) MCG/ACT inhaler  Commonly known as:  PROVENTIL HFA;VENTOLIN HFA  Inhale 2 puffs into the lungs every 4 (four) hours as needed for wheezing or shortness of breath.     albuterol (2.5 MG/3ML) 0.083% nebulizer solution  Commonly known as:  PROVENTIL  Take 3 mLs (2.5 mg total) by nebulization every 4 (four) hours as needed for wheezing or shortness of breath.     calcium carbonate 500 MG chewable tablet  Commonly known as:  TUMS - dosed in mg elemental calcium  Chew 2 tablets by mouth as needed for indigestion or heartburn.     cyclobenzaprine 10 MG tablet  Commonly known as:  FLEXERIL  take 1 tablet by mouth every 8 hours if needed     EPINEPHrine 1 MG/ML injection  Commonly known as:  ADRENALIN  Inject 1 mL (1 mg total) into the muscle once.     Fluticasone-Salmeterol 250-50 MCG/DOSE Aepb  Commonly known as:  ADVAIR DISKUS  Inhale 1 puff into the lungs 2 (two) times daily.     levETIRAcetam 500 MG tablet  Commonly known as:  KEPPRA  Take 1 tablet (500 mg total) by mouth 2 (two) times daily.     montelukast 10 MG tablet  Commonly known as:  SINGULAIR  Take 1 tablet (10 mg total) by mouth at bedtime.     oxyCODONE 5 MG immediate release tablet  Commonly known as:  Oxy IR/ROXICODONE  Take 1 tablet (5 mg total) by mouth every 4 (four) hours as needed for moderate pain.     Prenatal Vitamins 0.8 MG tablet  Take 1 tablet by mouth daily.        Diet: routine diet  Activity: Advance as  tolerated. Pelvic rest for 6 weeks.   Outpatient follow up: 6 weeks, sooner if needed  Postpartum contraception: Nexplanon  Newborn Data: Live born female  Birth Weight: 5 lb 10.3 oz (2560 g) APGAR: 8, 9  Baby Feeding: Breast Disposition:home with mother   Keane Scrape, MD 11/11/2014 11:21 AM    OB FELLOW DISCHARGE ATTESTATION  I have seen and examined this patient and agree with above documentation in the resident's note.   Desma Maxim, MD 5:00 PM

## 2014-11-11 NOTE — Lactation Note (Signed)
This note was copied from the chart of Jasmine [name]. Lactation Consultation Note  Patient Name: Jasmine Ivery Michalski OVANV'B Date: 11/11/2014 Reason for consult: Follow-up assessment;Infant < 6lbs   Follow up with mom prior to D/C. Infant with 8 bottle feedings of 6-36 cc, 2 BF for 15 and 30 minutes, 2 BF attempts, 3 voids and 5 stools in last 24 hours. Infant with 3 % weight loss since birth. Mom reports that infant was bottle fed throughout the night by grand mother so the mom could sleep. Mom was laying in bed with infant STS, she asked that I help her put baby back in crib as gmother was not in room. Grandmother returned and we discussed supply and demand and need to BF or pump q 2-3 hours to prevent engorgement and to initiate and maintain a milk supply. Mom voiced that gmother did not awaken her during the night to BF the infant. Infant was cueing to feed, assisted mom is latching infant to right breast in football hold, mom helped very little with process and needed encouragement. Infant latched after 5 tries with vigorous suckling and frequent swallows. Mom reports she was afraid of smothering infant, we discussed positioning and pointed out that she could hear infant breathing during feeding. Infant fed well for 10 minutes, then released breast on her own. Discussed NL NB feeding behaviors including cluster feeding and need to feed infant 8-12 x in 24 hours at first feeding cues with no more than 3 hours between feedings due to infant being < 6 pounds. Discussed feeding cues and awakening techniques. Reviewed BF information in Taking Care of Baby and Me Booklet. Mom is feeling very full today, hardness and lumpiness not noted, reviewed Engorgement Prevention and Treatment. Mom is a [facility] client and will call today for an appointment. Mom has a hand pump at home that she plans to use, she declined a [facility] Loaner pump today due to $30 deposit. Encouraged mom to practice BF while here in the hospital  before she goes home. [facility] Brochure reviewed including Phone #, BF Resources, OP Services, and Support Groups. Informed mom that [facility] is also a BF Resource for her. Enc mom to call with questions and concerns as needed. Infant has follow up Ped appointment tomorrow morning.              Maternal Data Does the patient have breastfeeding experience prior to this delivery?: No  Feeding Feeding Type: Breast Fed Length of feed: 10 min  LATCH Score/Interventions Latch: Grasps breast easily, tongue down, lips flanged, rhythmical sucking. Intervention(s): Adjust position;Assist with latch;Breast massage;Breast compression  Audible Swallowing: Spontaneous and intermittent Intervention(s): Skin to skin Intervention(s): Skin to skin  Type of Nipple: Everted at rest and after stimulation  Comfort (Breast/Nipple): Filling, red/small blisters or bruises, mild/mod discomfort  Problem noted: Filling Interventions (Filling): Frequent nursing;Hand pump;Massage  Hold (Positioning): Assistance needed to correctly position infant at breast and maintain latch. Intervention(s): Breastfeeding basics reviewed;Support Pillows;Position options;Skin to skin  LATCH Score: 8  Lactation Tools Discussed/Used WIC Program: Yes Pump Review: Setup, frequency, and cleaning;Milk Storage   Consult Status Consult Status: Complete Date: 11/11/14 Follow-up type: Call as needed    Debby Freiberg Wali Reinheimer 11/11/2014, 9:08 AM

## 2014-11-18 ENCOUNTER — Encounter: Payer: Self-pay | Admitting: *Deleted

## 2014-12-16 ENCOUNTER — Ambulatory Visit (INDEPENDENT_AMBULATORY_CARE_PROVIDER_SITE_OTHER): Payer: Medicaid Other | Admitting: Obstetrics and Gynecology

## 2014-12-16 ENCOUNTER — Encounter: Payer: Self-pay | Admitting: Obstetrics and Gynecology

## 2014-12-16 DIAGNOSIS — Z3202 Encounter for pregnancy test, result negative: Secondary | ICD-10-CM | POA: Diagnosis not present

## 2014-12-16 DIAGNOSIS — Z30017 Encounter for initial prescription of implantable subdermal contraceptive: Secondary | ICD-10-CM

## 2014-12-16 LAB — POCT PREGNANCY, URINE: PREG TEST UR: NEGATIVE

## 2014-12-16 NOTE — Progress Notes (Signed)
  Subjective:     Jasmine [name] is a 24 y.o. female who presents for a postpartum visit. She is 6 weeks postpartum following a low cervical transverse Cesarean section. I have fully reviewed the prenatal and intrapartum course. The delivery was at 39.2 gestational weeks. Outcome: repeat cesarean section, low transverse incision. Anesthesia: spinal. Postpartum course has been uncomplicated. Baby's course has been uncomplicated. Baby is feeding by both breast and bottle - Enfamil with Iron. Bleeding no bleeding. Bowel function is normal. Bladder function is normal. Patient is not sexually active. Contraception method is abstinence. Postpartum depression screening: negative.     Review of Systems Pertinent items are noted in HPI.   Objective:    There were no vitals taken for this visit.  General:  alert, cooperative and no distress   Breasts:  inspection negative, no nipple discharge or bleeding, no masses or nodularity palpable  Lungs: clear to auscultation bilaterally  Heart:  regular rate and rhythm  Abdomen: soft, non-tender; bowel sounds normal; no masses,  no organomegaly and incision: no erythema, induration or drainage. Healing well   Vulva:  normal  Vagina: normal vagina, no discharge, exudate, lesion, or erythema  Cervix:  multiparous appearance  Corpus: normal size, contour, position, consistency, mobility, non-tender  Adnexa:  normal adnexa and no mass, fullness, tenderness  Rectal Exam: Not performed.        Assessment:     Normal postpartum exam. Pap smear not done at today's visit.   Plan:    1. Contraception: Nexplanon  Patient given informed consent, signed copy in the chart, time out was performed. Pregnancy test was negative. Appropriate time out taken.  Patient's left arm was prepped and draped in the usual sterile fashion.. The ruler used to measure and mark insertion area.  Patient was prepped with alcohol swab and then injected with 1 cc of 1% lidocaine with  epinephrine.  Patient was prepped with betadine, Nexplanon removed form packaging.  Device confirmed in needle, then inserted full length of needle and withdrawn per handbook instructions.  Patient insertion site covered with a pressure dressing.   Minimal blood loss.  Patient tolerated the procedure well.  2. Patient is medically cleared to resume all activities of daily activities 3. Follow up in:  as needed.

## 2015-01-02 ENCOUNTER — Emergency Department (HOSPITAL_COMMUNITY): Payer: Medicaid Other

## 2015-01-02 ENCOUNTER — Inpatient Hospital Stay (HOSPITAL_COMMUNITY)
Admission: EM | Admit: 2015-01-02 | Discharge: 2015-01-07 | DRG: 203 | Discharge status: Against Medical Advice | Payer: Medicaid Other | Attending: Family Medicine | Admitting: Family Medicine

## 2015-01-02 DIAGNOSIS — Z825 Family history of asthma and other chronic lower respiratory diseases: Secondary | ICD-10-CM

## 2015-01-02 DIAGNOSIS — J45901 Unspecified asthma with (acute) exacerbation: Principal | ICD-10-CM | POA: Diagnosis present

## 2015-01-02 DIAGNOSIS — J45909 Unspecified asthma, uncomplicated: Secondary | ICD-10-CM | POA: Diagnosis present

## 2015-01-02 DIAGNOSIS — R69 Illness, unspecified: Secondary | ICD-10-CM

## 2015-01-02 DIAGNOSIS — D573 Sickle-cell trait: Secondary | ICD-10-CM | POA: Diagnosis present

## 2015-01-02 DIAGNOSIS — J111 Influenza due to unidentified influenza virus with other respiratory manifestations: Secondary | ICD-10-CM | POA: Diagnosis present

## 2015-01-02 DIAGNOSIS — Z87891 Personal history of nicotine dependence: Secondary | ICD-10-CM

## 2015-01-02 DIAGNOSIS — Z79899 Other long term (current) drug therapy: Secondary | ICD-10-CM

## 2015-01-02 DIAGNOSIS — J4541 Moderate persistent asthma with (acute) exacerbation: Secondary | ICD-10-CM

## 2015-01-02 DIAGNOSIS — R0902 Hypoxemia: Secondary | ICD-10-CM | POA: Diagnosis present

## 2015-01-02 DIAGNOSIS — Z8249 Family history of ischemic heart disease and other diseases of the circulatory system: Secondary | ICD-10-CM

## 2015-01-02 DIAGNOSIS — G40909 Epilepsy, unspecified, not intractable, without status epilepticus: Secondary | ICD-10-CM | POA: Diagnosis present

## 2015-01-02 MED ORDER — METHYLPREDNISOLONE SODIUM SUCC 125 MG IJ SOLR
125.0000 mg | Freq: Once | INTRAMUSCULAR | Status: AC
  Administered 2015-01-03: 125 mg via INTRAVENOUS
  Filled 2015-01-02: qty 2

## 2015-01-02 MED ORDER — ALBUTEROL (5 MG/ML) CONTINUOUS INHALATION SOLN
10.0000 mg/h | INHALATION_SOLUTION | Freq: Once | RESPIRATORY_TRACT | Status: AC
  Administered 2015-01-03: 10 mg/h via RESPIRATORY_TRACT
  Filled 2015-01-02: qty 20

## 2015-01-02 MED ORDER — ALBUTEROL SULFATE (2.5 MG/3ML) 0.083% IN NEBU
5.0000 mg | INHALATION_SOLUTION | Freq: Once | RESPIRATORY_TRACT | Status: AC
  Administered 2015-01-02: 5 mg via RESPIRATORY_TRACT
  Filled 2015-01-02: qty 6

## 2015-01-02 MED ORDER — MAGNESIUM SULFATE 50 % IJ SOLN
1.0000 g | Freq: Once | INTRAMUSCULAR | Status: AC
  Administered 2015-01-03: 1 g via INTRAVENOUS
  Filled 2015-01-02: qty 2

## 2015-01-02 NOTE — ED Notes (Addendum)
Pt c/o asthma exacerbation with a fever of 102 for two days.Pt self administered albuterol at 1400 today- did not help significantly. Cough, stuffy nose, chills, also noted. Pt c/o of 10/10 headache pain. Also complaining of right rib pain which is worse upon inspiration.  Hx of asthma and seizures-Gave birth on October 24- has not been taking seizure medication since.

## 2015-01-02 NOTE — ED Notes (Signed)
Chest xray at bedside.

## 2015-01-02 NOTE — ED Provider Notes (Signed)
CSN: [id number]     Arrival date & time 01/02/15  2247 History  By signing my name below, I, Irene Pap, attest that this documentation has been prepared under the direction and in the presence of Orpah Greek, MD. Electronically Signed: Irene Pap, ED Scribe. 01/02/2015. 1:01 AM.   Chief Complaint  Patient presents with  . Shortness of Breath    The history is provided by the patient and a parent. No language interpreter was used.    HPI Comments: Jasmine [name] is a 24 y.o. female with a hx of asthma, seizures, and sickle cell trait who presents to the Emergency Department complaining of SOB onset 3 days ago. Pt reports associated fever tmax 102 F for 2 days, chills, rhinorrhea, headache that she rates 10/10, and cough. She states worsening right rib pain and SOB with inspiration She reports using breathing treatments and her inhaler multiple times a day to no relief. Mother reports that pt has been in the ICU and intubated multiple times for her asthma attacks. Pt gave birth on 11/08/14. Pt has a pulmonologist. Pt denies nausea or vomiting.   Past Medical History  Diagnosis Date  . Asthma   . Sickle cell trait ([place])   . Pancreatitis   . GERD (gastroesophageal reflux disease)   . Seizures ([place])     epilepsy  . Heart murmur     last work up age 56- no symtoms   Past Surgical History  Procedure Laterality Date  . Nerve, tendon and artery repair Right 09/23/2012    Procedure: I&D and Repair As Necessary/Right Hand and Palm;  Surgeon: Roseanne Kaufman, MD;  Location: [facility];  Service: Orthopedics;  Laterality: Right;  . Tonsillectomy    . Hernia repair    . Cesarean section      x 1  . Hand surgery    . Appendectomy    . Cesarean section N/A 11/08/2014    Procedure: CESAREAN SECTION;  Surgeon: Truett Mainland, DO;  Location: [facility];  Service: Obstetrics;  Laterality: N/A;   Family History  Problem Relation Age of Onset  . Cancer Mother     cervical cancer  . Asthma  Mother   . Hypertension Father   . Sickle cell anemia Father   . Asthma Sister   . Diabetes Maternal Aunt   . Cancer Maternal Grandmother    Social History  Substance Use Topics  . Smoking status: Former Smoker    Types: Cigarettes    Quit date: 03/27/2014  . Smokeless tobacco: Never Used  . Alcohol Use: No   OB History    Gravida Para Term Preterm AB TAB SAB Ectopic Multiple Living   3 3 3       0 3     Review of Systems  Constitutional: Positive for fever and chills.  HENT: Positive for rhinorrhea.   Respiratory: Positive for cough and shortness of breath.   Gastrointestinal: Negative for nausea and vomiting.  Neurological: Positive for headaches.  All other systems reviewed and are negative.  Allergies  Avocado; Cheese; Chocolate; Ibuprofen; Orange juice; Other; Peach; Peanuts; Pear; Prednisone; Raspberry; Tylenol; Amoxicillin; Doxycycline; Erythromycin; Ivp dye; Latex; Milk of magnesia; and Penicillins  Home Medications   Prior to Admission medications   Medication Sig Start Date End Date Taking? Authorizing Provider  albuterol (PROVENTIL HFA;VENTOLIN HFA) 108 (90 BASE) MCG/ACT inhaler Inhale 2 puffs into the lungs every 4 (four) hours as needed for wheezing or shortness of breath. 07/06/14  Shanker Kristeen Mans, MD  albuterol (PROVENTIL) (2.5 MG/3ML) 0.083% nebulizer solution Take 3 mLs (2.5 mg total) by nebulization every 4 (four) hours as needed for wheezing or shortness of breath. 07/06/14   Jonetta Osgood, MD  calcium carbonate (TUMS - DOSED IN MG ELEMENTAL CALCIUM) 500 MG chewable tablet Chew 2 tablets by mouth as needed for indigestion or heartburn.    Historical Provider, MD  cyclobenzaprine (FLEXERIL) 10 MG tablet take 1 tablet by mouth every 8 hours if needed 09/30/14   Historical Provider, MD  EPINEPHrine (ADRENALIN) 1 MG/ML injection Inject 1 mL (1 mg total) into the muscle once. 06/10/14   Nila Nephew, MD  Fluticasone-Salmeterol (ADVAIR DISKUS) 250-50 MCG/DOSE  AEPB Inhale 1 puff into the lungs 2 (two) times daily. 07/06/14   Shanker Kristeen Mans, MD  levETIRAcetam (KEPPRA) 500 MG tablet Take 1 tablet (500 mg total) by mouth 2 (two) times daily. 07/06/14   Shanker Kristeen Mans, MD  montelukast (SINGULAIR) 10 MG tablet Take 1 tablet (10 mg total) by mouth at bedtime. 07/06/14   Shanker Kristeen Mans, MD  oxyCODONE (OXY IR/ROXICODONE) 5 MG immediate release tablet Take 1 tablet (5 mg total) by mouth every 4 (four) hours as needed for moderate pain. 11/11/14   Keane Scrape, MD  Prenatal Multivit-Min-Fe-FA (PRENATAL VITAMINS) 0.8 MG tablet Take 1 tablet by mouth daily. 06/10/14   Nila Nephew, MD   BP 176/100 mmHg  Temp(Src) 98.8 F (37.1 C) (Oral)  Resp 19  SpO2 96% Physical Exam  Constitutional: She is oriented to person, place, and time. She appears well-developed and well-nourished. No distress.  HENT:  Head: Normocephalic and atraumatic.  Right Ear: Hearing normal.  Left Ear: Hearing normal.  Nose: Nose normal.  Mouth/Throat: Oropharynx is clear and moist and mucous membranes are normal.  Eyes: Conjunctivae and EOM are normal. Pupils are equal, round, and reactive to light.  Neck: Normal range of motion. Neck supple.  Cardiovascular: Regular rhythm, S1 normal and S2 normal.  Exam reveals no gallop and no friction rub.   No murmur heard. Pulmonary/Chest: She exhibits no tenderness.  Breath sounds severely diminished bilaterally; increased work up breathing; tripoding  Abdominal: Soft. Normal appearance and bowel sounds are normal. There is no hepatosplenomegaly. There is no tenderness. There is no rebound, no guarding, no tenderness at McBurney's point and negative Murphy's sign. No hernia.  Musculoskeletal: Normal range of motion.  Neurological: She is alert and oriented to person, place, and time. She has normal strength. No cranial nerve deficit or sensory deficit. Coordination normal. GCS eye subscore is 4. GCS verbal subscore is 5. GCS motor subscore  is 6.  Skin: Skin is warm, dry and intact. No rash noted. No cyanosis.  Psychiatric: She has a normal mood and affect. Her speech is normal and behavior is normal. Thought content normal.  Nursing note and vitals reviewed.   ED Course  Procedures (including critical care time) DIAGNOSTIC STUDIES: Oxygen Saturation is 96% on RA, normal by my interpretation.    COORDINATION OF CARE: 11:09 PM-Discussed treatment plan which includes labs and chest x-ray with pt at bedside and pt agreed to plan.    Labs Review Labs Reviewed  CBC WITH DIFFERENTIAL/PLATELET - Abnormal; Notable for the following:    WBC 11.8 (*)    RDW 15.7 (*)    All other components within normal limits  BLOOD GAS, ARTERIAL - Abnormal; Notable for the following:    pCO2 arterial 30.7 (*)    pO2, Arterial 126 (*)  Bicarbonate 19.4 (*)    Acid-base deficit 3.7 (*)    All other components within normal limits  BASIC METABOLIC PANEL  TROPONIN I    Imaging Review Dg Chest Port 1 View  01/03/2015  CLINICAL DATA:  Central chest pain.  Shortness of breath. EXAM: PORTABLE CHEST 1 VIEW COMPARISON:  07/04/2014 FINDINGS: The heart size and mediastinal contours are within normal limits. Both lungs are clear. The visualized skeletal structures are unremarkable. IMPRESSION: Negative portable chest. Electronically Signed   By: Monte Fantasia M.D.   On: 01/03/2015 00:26   I have personally reviewed and evaluated these images and lab results as part of my medical decision-making.   EKG Interpretation None      MDM   Final diagnoses:  None   asthma exacerbation  Patient presents to the ER for evaluation of shortness of breath. Patient reports that she has been experiencing shortness of breath and wheezing for the last 2 days. She has been taking multiple nebulizer treatments and inhaler treatments without much improvement. She has had fever at home and there is slight cough.  Patient arrives in mild respiratory distress.  She is exhibiting increased work of breathing, minimal air movement with tripoding. She has improved with magnesium, Solu-Medrol, continuous nebulizer treatment. She is still, however, not moving air well. Blood gas does not show significant CO2 retention or hypoxia, however. Patient does have an extensive history of severe exacerbations. Patient has been admitted to the ICU multiple times and has been intubated multiple times in the past because of asthma exacerbations. Based on this, recommend patient be admitted for further management.  I personally performed the services described in this documentation, which was scribed in my presence. The recorded information has been reviewed and is accurate.     Orpah Greek, MD 01/03/15 [phone]

## 2015-01-03 ENCOUNTER — Encounter (HOSPITAL_COMMUNITY): Payer: Self-pay | Admitting: *Deleted

## 2015-01-03 DIAGNOSIS — J45901 Unspecified asthma with (acute) exacerbation: Secondary | ICD-10-CM | POA: Diagnosis not present

## 2015-01-03 DIAGNOSIS — G40909 Epilepsy, unspecified, not intractable, without status epilepticus: Secondary | ICD-10-CM | POA: Diagnosis present

## 2015-01-03 DIAGNOSIS — R0902 Hypoxemia: Secondary | ICD-10-CM | POA: Diagnosis present

## 2015-01-03 DIAGNOSIS — R569 Unspecified convulsions: Secondary | ICD-10-CM | POA: Diagnosis not present

## 2015-01-03 DIAGNOSIS — Z79899 Other long term (current) drug therapy: Secondary | ICD-10-CM | POA: Diagnosis not present

## 2015-01-03 DIAGNOSIS — Z87891 Personal history of nicotine dependence: Secondary | ICD-10-CM | POA: Diagnosis not present

## 2015-01-03 DIAGNOSIS — J111 Influenza due to unidentified influenza virus with other respiratory manifestations: Secondary | ICD-10-CM | POA: Diagnosis present

## 2015-01-03 DIAGNOSIS — R0600 Dyspnea, unspecified: Secondary | ICD-10-CM | POA: Diagnosis not present

## 2015-01-03 DIAGNOSIS — J45909 Unspecified asthma, uncomplicated: Secondary | ICD-10-CM | POA: Diagnosis present

## 2015-01-03 DIAGNOSIS — Z825 Family history of asthma and other chronic lower respiratory diseases: Secondary | ICD-10-CM | POA: Diagnosis not present

## 2015-01-03 DIAGNOSIS — R69 Illness, unspecified: Secondary | ICD-10-CM

## 2015-01-03 DIAGNOSIS — D573 Sickle-cell trait: Secondary | ICD-10-CM | POA: Diagnosis present

## 2015-01-03 DIAGNOSIS — R0781 Pleurodynia: Secondary | ICD-10-CM | POA: Diagnosis not present

## 2015-01-03 DIAGNOSIS — Z8249 Family history of ischemic heart disease and other diseases of the circulatory system: Secondary | ICD-10-CM | POA: Diagnosis not present

## 2015-01-03 LAB — BASIC METABOLIC PANEL
ANION GAP: 7 (ref 5–15)
BUN: 7 mg/dL (ref 6–20)
CALCIUM: 9.5 mg/dL (ref 8.9–10.3)
CO2: 27 mmol/L (ref 22–32)
Chloride: 107 mmol/L (ref 101–111)
Creatinine, Ser: 0.76 mg/dL (ref 0.44–1.00)
GFR calc non Af Amer: >60 mL/min (ref >60)
Glucose, Bld: 91 mg/dL (ref 65–99)
Potassium: 3.5 mmol/L (ref 3.5–5.1)
Sodium: 141 mmol/L (ref 135–145)

## 2015-01-03 LAB — CBC WITH DIFFERENTIAL/PLATELET
BASOS ABS: 0 10*3/uL (ref 0.0–0.1)
BASOS PCT: 0 %
Eosinophils Absolute: 0.2 10*3/uL (ref 0.0–0.7)
Eosinophils Relative: 2 %
HEMATOCRIT: 40.6 % (ref 36.0–46.0)
HEMOGLOBIN: 12.9 g/dL (ref 12.0–15.0)
Lymphocytes Relative: 30 %
Lymphs Abs: 3.5 10*3/uL (ref 0.7–4.0)
MCH: 26.1 pg (ref 26.0–34.0)
MCHC: 31.8 g/dL (ref 30.0–36.0)
MCV: 82 fL (ref 78.0–100.0)
Monocytes Absolute: 0.6 10*3/uL (ref 0.1–1.0)
Monocytes Relative: 5 %
NEUTROS ABS: 7.5 10*3/uL (ref 1.7–7.7)
NEUTROS PCT: 63 %
Platelets: 268 10*3/uL (ref 150–400)
RBC: 4.95 MIL/uL (ref 3.87–5.11)
RDW: 15.7 % — AB (ref 11.5–15.5)
WBC: 11.8 10*3/uL — ABNORMAL HIGH (ref 4.0–10.5)

## 2015-01-03 LAB — INFLUENZA PANEL BY PCR (TYPE A & B)
H1N1 flu by pcr: NOT DETECTED
INFLAPCR: NEGATIVE
Influenza B By PCR: NEGATIVE

## 2015-01-03 LAB — BLOOD GAS, ARTERIAL
ACID-BASE DEFICIT: 3.7 mmol/L — AB (ref 0.0–2.0)
BICARBONATE: 19.4 meq/L — AB (ref 20.0–24.0)
Drawn by: 232811
O2 CONTENT: 2 L/min
O2 Saturation: 98.7 %
PATIENT TEMPERATURE: 98.6
PCO2 ART: 30.7 mmHg — AB (ref 35.0–45.0)
PO2 ART: 126 mmHg — AB (ref 80.0–100.0)
TCO2: 17.4 mmol/L (ref 0–100)
pH, Arterial: 7.418 (ref 7.350–7.450)

## 2015-01-03 LAB — TROPONIN I

## 2015-01-03 MED ORDER — ALBUTEROL SULFATE HFA 108 (90 BASE) MCG/ACT IN AERS: 2.0000 | INHALATION_SPRAY | RESPIRATORY_TRACT | Status: DC | PRN

## 2015-01-03 MED ORDER — OXYCODONE HCL 5 MG PO TABS
5.0000 mg | ORAL_TABLET | ORAL | Status: DC | PRN
  Administered 2015-01-03 – 2015-01-07 (×16): 5 mg via ORAL
  Filled 2015-01-03 (×16): qty 1

## 2015-01-03 MED ORDER — METHYLPREDNISOLONE SODIUM SUCC 125 MG IJ SOLR
60.0000 mg | Freq: Two times a day (BID) | INTRAMUSCULAR | Status: DC
  Administered 2015-01-03 – 2015-01-07 (×9): 60 mg via INTRAVENOUS
  Filled 2015-01-03 (×9): qty 2

## 2015-01-03 MED ORDER — MONTELUKAST SODIUM 10 MG PO TABS
10.0000 mg | ORAL_TABLET | Freq: Every day | ORAL | Status: DC
  Administered 2015-01-03 – 2015-01-06 (×4): 10 mg via ORAL
  Filled 2015-01-03 (×5): qty 1

## 2015-01-03 MED ORDER — ALBUTEROL SULFATE (2.5 MG/3ML) 0.083% IN NEBU
2.5000 mg | INHALATION_SOLUTION | RESPIRATORY_TRACT | Status: DC | PRN
  Administered 2015-01-05 (×2): 2.5 mg via RESPIRATORY_TRACT
  Filled 2015-01-03 (×2): qty 3

## 2015-01-03 MED ORDER — TRAMADOL HCL 50 MG PO TABS
50.0000 mg | ORAL_TABLET | Freq: Once | ORAL | Status: AC
  Administered 2015-01-03: 50 mg via ORAL
  Filled 2015-01-03: qty 1

## 2015-01-03 MED ORDER — LEVETIRACETAM 500 MG PO TABS
500.0000 mg | ORAL_TABLET | Freq: Two times a day (BID) | ORAL | Status: DC
  Administered 2015-01-03 – 2015-01-07 (×10): 500 mg via ORAL
  Filled 2015-01-03 (×12): qty 1

## 2015-01-03 MED ORDER — BUDESONIDE 0.25 MG/2ML IN SUSP
0.2500 mg | Freq: Two times a day (BID) | RESPIRATORY_TRACT | Status: DC
  Administered 2015-01-03 – 2015-01-07 (×9): 0.25 mg via RESPIRATORY_TRACT
  Filled 2015-01-03 (×9): qty 2

## 2015-01-03 MED ORDER — OSELTAMIVIR PHOSPHATE 75 MG PO CAPS
75.0000 mg | ORAL_CAPSULE | Freq: Two times a day (BID) | ORAL | Status: DC
  Administered 2015-01-03: 75 mg via ORAL
  Filled 2015-01-03 (×6): qty 1

## 2015-01-03 MED ORDER — CYCLOBENZAPRINE HCL 10 MG PO TABS
10.0000 mg | ORAL_TABLET | Freq: Three times a day (TID) | ORAL | Status: DC | PRN
  Administered 2015-01-04: 10 mg via ORAL
  Filled 2015-01-03: qty 1

## 2015-01-03 MED ORDER — MOMETASONE FURO-FORMOTEROL FUM 100-5 MCG/ACT IN AERO
2.0000 | INHALATION_SPRAY | Freq: Two times a day (BID) | RESPIRATORY_TRACT | Status: DC
  Filled 2015-01-03: qty 8.8

## 2015-01-03 MED ORDER — CETYLPYRIDINIUM CHLORIDE 0.05 % MT LIQD
7.0000 mL | Freq: Two times a day (BID) | OROMUCOSAL | Status: DC
  Administered 2015-01-03 – 2015-01-07 (×9): 7 mL via OROMUCOSAL

## 2015-01-03 MED ORDER — HYDROCODONE-HOMATROPINE 5-1.5 MG/5ML PO SYRP
5.0000 mL | ORAL_SOLUTION | Freq: Four times a day (QID) | ORAL | Status: DC | PRN
  Administered 2015-01-03 – 2015-01-06 (×6): 5 mL via ORAL
  Filled 2015-01-03 (×6): qty 5

## 2015-01-03 MED ORDER — PNEUMOCOCCAL VAC POLYVALENT 25 MCG/0.5ML IJ INJ
0.5000 mL | INJECTION | INTRAMUSCULAR | Status: DC
  Filled 2015-01-03 (×2): qty 0.5

## 2015-01-03 MED ORDER — ALBUTEROL SULFATE (2.5 MG/3ML) 0.083% IN NEBU
2.5000 mg | INHALATION_SOLUTION | Freq: Four times a day (QID) | RESPIRATORY_TRACT | Status: DC
Start: 2015-01-03 — End: 2015-01-07
  Administered 2015-01-03 – 2015-01-07 (×17): 2.5 mg via RESPIRATORY_TRACT
  Filled 2015-01-03 (×18): qty 3

## 2015-01-03 MED ORDER — HEPARIN SODIUM (PORCINE) 5000 UNIT/ML IJ SOLN
5000.0000 [IU] | Freq: Three times a day (TID) | INTRAMUSCULAR | Status: DC
  Administered 2015-01-03 – 2015-01-07 (×11): 5000 [IU] via SUBCUTANEOUS
  Filled 2015-01-03 (×13): qty 1

## 2015-01-03 NOTE — Progress Notes (Signed)
Report from Romie Minus, [place]. Care assumed for pt at this time. Assessment as charted. Pt c/o rib pain d/t coughing. Romie Minus, RN, has notified Dr Dyann Kief. Await orders. Pt tearful, missing infant daughter. Emotional support provided. Pt appreciative. Will monitor.

## 2015-01-03 NOTE — Progress Notes (Signed)
Patient seen and examined. Admitted after midnight secondary to SOB and influenza like illness. No fever and with some improvements in her breathing after nebulizer therapy and Magnesium. Patient with hx of asthma. Reports some sick contacts with URI and also exposure to cold weather. Please refer to H&P written by Dr. Alcario Drought for further info/details on admission.   Plan: -nebulizer treatment base on adult wheezing protocol -solumedrol and pulmicort -flutter valve and PRN oxygen supplementation -will continue singulair -will follow influenza panel and continue empiric tamiflu for now -will follow clinical response  Barton Dubois [id number]

## 2015-01-03 NOTE — Progress Notes (Signed)
PT demonstrated verbal and hands on understanding of Flutter device. 

## 2015-01-03 NOTE — H&P (Addendum)
[facility] History and Physical  Jasmine [name] [id number] DOB: [birth year] DOA: 01/02/2015  Referring physician: EDP PCP: Brayton Caves, PA-C   Chief Complaint: ILI   HPI: Jasmine [name] is a 24 y.o. female with h/o Asthma, presents to ED with exacerbation of Asthma as well as influenza like illness.  Fever of 102 for 2 days, associated URI symptoms, cough, runny nose, muscle aches.  Mother with similar symptoms as well.  Has had flu shot this year.  Neb treatments at home didn't help with breathing.  Review of Systems: Systems reviewed.  As above, otherwise negative  Past Medical History  Diagnosis Date  . Asthma   . Sickle cell trait ([place])   . Pancreatitis   . GERD (gastroesophageal reflux disease)   . 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 Heart murmur     last work up age 17- no symtoms   Past Surgical History  Procedure Laterality Date  . Nerve, tendon and artery repair Right 09/23/2012    Procedure: I&D and Repair As Necessary/Right Hand and Palm;  Surgeon: Roseanne Kaufman, MD;  Location: [facility];  Service: Orthopedics;  Laterality: Right;  . Tonsillectomy    . Hernia repair    . Cesarean section      x 1  . Hand surgery    . Appendectomy    . Cesarean section N/A 11/08/2014    Procedure: CESAREAN SECTION;  Surgeon: Truett Mainland, DO;  Location: [facility];  Service: Obstetrics;  Laterality: N/A;   Social History:  reports that she quit smoking about 9 months ago. Her smoking use included Cigarettes. She has never used smokeless tobacco. She reports that she does not drink alcohol or use illicit drugs.  Allergies  Allergen Reactions  . Avocado Anaphylaxis  . Cheese Shortness Of Breath    Asthma trigger  . Chocolate Shortness Of Breath    Asthma trigger  . Ibuprofen Hives and Shortness Of Breath    Tolerates naproxen  . Orange Juice [Orange Oil] Shortness Of Breath  . Other Hives    ALLERGIC TO ALL STEROIDS       EXCEPT IV SOLU MEDROL  . Peach [Prunus Persica]  Anaphylaxis  . Peanuts [Peanut Oil] Anaphylaxis  . Pear Anaphylaxis  . Prednisone Hives  . Raspberry Anaphylaxis  . Tylenol [Acetaminophen] Hives and Shortness Of Breath  . Amoxicillin Hives    Has patient had a PCN reaction causing immediate rash, facial/tongue/throat swelling, SOB or lightheadedness with hypotension:Yes Has patient had a PCN reaction causing severe rash involving mucus membranes or skin necrosis:No Has patient had a PCN reaction that required hospitalization:no Has patient had a PCN reaction occurring within the last 10 years:No If all of the above answers are "NO", then may proceed with Cephalosporin use.     Marland Kitchen Doxycycline Hives  . Erythromycin Hives  . Ivp Dye [Iodinated Diagnostic Agents]     Shortness of breath   . Latex     Swelling/anaphylaxis  . Milk Of Magnesia [Magnesium Hydroxide] Hives and Itching  . Penicillins Hives    Has patient had a PCN reaction causing immediate rash, facial/tongue/throat swelling, SOB or lightheadedness with hypotension: Yes Has patient had a PCN reaction causing severe rash involving mucus membranes or skin necrosis: No Has patient had a PCN reaction that required hospitalization Yes Has patient had a PCN reaction occurring within the last 10 years: No If all of the above answers are "NO", then may proceed with Cephalosporin use.  Family History  Problem Relation Age of Onset  . Cancer Mother     cervical cancer  . Asthma Mother   . Hypertension Father   . Sickle cell anemia Father   . Asthma Sister   . Diabetes Maternal Aunt   . Cancer Maternal Grandmother      Prior to Admission medications   Medication Sig Start Date End Date Taking? Authorizing Provider  albuterol (PROVENTIL HFA;VENTOLIN HFA) 108 (90 BASE) MCG/ACT inhaler Inhale 2 puffs into the lungs every 4 (four) hours as needed for wheezing or shortness of breath. 07/06/14  Yes Shanker Kristeen Mans, MD  albuterol (PROVENTIL) (2.5 MG/3ML) 0.083% nebulizer  solution Take 3 mLs (2.5 mg total) by nebulization every 4 (four) hours as needed for wheezing or shortness of breath. 07/06/14  Yes Shanker Kristeen Mans, MD  cyclobenzaprine (FLEXERIL) 10 MG tablet take 1 tablet by mouth every 8 hours if needed 09/30/14  Yes Historical Provider, MD  EPINEPHrine (ADRENALIN) 1 MG/ML injection Inject 1 mL (1 mg total) into the muscle once. 06/10/14  Yes Nila Nephew, MD  Fluticasone-Salmeterol (ADVAIR DISKUS) 250-50 MCG/DOSE AEPB Inhale 1 puff into the lungs 2 (two) times daily. 07/06/14  Yes Shanker Kristeen Mans, MD  levETIRAcetam (KEPPRA) 500 MG tablet Take 1 tablet (500 mg total) by mouth 2 (two) times daily. 07/06/14  Yes Shanker Kristeen Mans, MD  montelukast (SINGULAIR) 10 MG tablet Take 1 tablet (10 mg total) by mouth at bedtime. 07/06/14  Yes Shanker Kristeen Mans, MD  oxyCODONE (OXY IR/ROXICODONE) 5 MG immediate release tablet Take 1 tablet (5 mg total) by mouth every 4 (four) hours as needed for moderate pain. 11/11/14  Yes Keane Scrape, MD  Prenatal Multivit-Min-Fe-FA (PRENATAL VITAMINS) 0.8 MG tablet Take 1 tablet by mouth daily. Patient not taking: Reported on 01/03/2015 06/10/14   Nila Nephew, MD   Physical Exam: Filed Vitals:   01/03/15 0015 01/03/15 0200  BP:    Pulse: 70 99  Temp:    Resp: 20     BP 176/100 mmHg  Pulse 99  Temp(Src) 98.8 F (37.1 C) (Oral)  Resp 20  SpO2 100%  General Appearance:    Alert, oriented, no distress, appears stated age  Head:    Normocephalic, atraumatic  Eyes:    PERRL, EOMI, sclera non-icteric        Nose:   Nares without drainage or epistaxis. Mucosa, turbinates normal  Throat:   Moist mucous membranes. Oropharynx without erythema or exudate.  Neck:   Supple. No carotid bruits.  No thyromegaly.  No lymphadenopathy.   Back:     No CVA tenderness, no spinal tenderness  Lungs:     Diffuse wheezing  Chest wall:    No tenderness to palpitation  Heart:    Regular rate and rhythm without murmurs, gallops, rubs  Abdomen:      Soft, non-tender, nondistended, normal bowel sounds, no organomegaly  Genitalia:    deferred  Rectal:    deferred  Extremities:   No clubbing, cyanosis or edema.  Pulses:   2+ and symmetric all extremities  Skin:   Skin color, texture, turgor normal, no rashes or lesions  Lymph nodes:   Cervical, supraclavicular, and axillary nodes normal  Neurologic:   CNII-XII intact. Normal strength, sensation and reflexes      throughout    Labs on Admission:  Basic Metabolic Panel:  Recent Labs Lab 01/03/15 0013  NA 141  K 3.5  CL 107  CO2 27  GLUCOSE 91  BUN 7  CREATININE 0.76  CALCIUM 9.5   Liver Function Tests: No results for input(s): AST, ALT, ALKPHOS, BILITOT, PROT, ALBUMIN in the last 168 hours. No results for input(s): LIPASE, AMYLASE in the last 168 hours. No results for input(s): AMMONIA in the last 168 hours. CBC:  Recent Labs Lab 01/03/15 0013  WBC 11.8*  NEUTROABS 7.5  HGB 12.9  HCT 40.6  MCV 82.0  PLT 268   Cardiac Enzymes:  Recent Labs Lab 01/03/15 0013  TROPONINI <0.03    BNP (last 3 results) No results for input(s): PROBNP in the last 8760 hours. CBG: No results for input(s): GLUCAP in the last 168 hours.  Radiological Exams on Admission: Dg Chest Port 1 View  01/03/2015  CLINICAL DATA:  Central chest pain.  Shortness of breath. EXAM: PORTABLE CHEST 1 VIEW COMPARISON:  07/04/2014 FINDINGS: The heart size and mediastinal contours are within normal limits. Both lungs are clear. The visualized skeletal structures are unremarkable. IMPRESSION: Negative portable chest. Electronically Signed   By: Monte Fantasia M.D.   On: 01/03/2015 00:26    EKG: Independently reviewed.  Assessment/Plan Active Problems:   Asthma exacerbation   Influenza-like illness   1. Asthma exacerbation - 1. Adult wheeze protocol 2. Solumedrol 2. ILI - 1. Flu panel 2. tamiflu    Code Status: Full  Family Communication: No family in room Disposition Plan: Admit to  obs  Time spent: 50 min  Senie Lanese M. [facility] Pager [phone]  If 7AM-7PM, please contact the day team taking care of the patient [email or web address] Password [email or web address] 01/03/2015, 2:48 AM

## 2015-01-04 ENCOUNTER — Ambulatory Visit: Payer: Medicaid Other | Admitting: Emergency Medicine

## 2015-01-04 DIAGNOSIS — R0600 Dyspnea, unspecified: Secondary | ICD-10-CM

## 2015-01-04 DIAGNOSIS — R0781 Pleurodynia: Secondary | ICD-10-CM

## 2015-01-04 NOTE — Progress Notes (Signed)
[facility] PROGRESS NOTE  Maàlle Scarpino [id number] DOB: Jul 12, 1990 DOA: 01/02/2015 PCP: Brayton Caves, PA-C  Assessment/Plan: 1-Resp distress with mild hypoxia: due to asthma exacerbation. On admission O2 sat was 86-88% and patient started on 2L -feeling somewhat better, but still SOB and with significant wheezing -will continue solumedrol, nebulizer tx, pulmicort and wean O2 as tolerated -PRN hycodan for cough control -will continue singulair  -will need outpatient follow up with pulmonary service to repeat PFT's and adjust maintenance therapy for her asthma as needed  -influenza PCR neg; will d/c tamiflu and droplet precautions   2-hx of seizure: -stable and w/o seizures in years -will continue keppra   3-pleuritic CP: due to coughing -will continue PRN pain meds    Code Status: Full Family Communication: no family at bedside  Disposition Plan: remains inpatient; continue nebulizer tx, steroids, continue pulmicort, hycodan and oxygen supplementation as needed    Consultants:  None   Procedures:  See below for x-ray reports   Antibiotics:  None   HPI/Subjective: No fever; complaining of chest discomfort due to ongoing coughing spells; also still with diffuse wheezing and SOB.   Objective: Filed Vitals:   01/03/15 2050 01/04/15 0511  BP: 104/75 128/73  Pulse: 60 71  Temp: 98.4 F (36.9 C) 97.9 F (36.6 C)  Resp: 17 16    Intake/Output Summary (Last 24 hours) at 01/04/15 1100 Last data filed at 01/04/15 0511  Gross per 24 hour  Intake    960 ml  Output      0 ml  Net    960 ml   Filed Weights   01/03/15 0413  Weight: 75.751 kg (167 lb)    Exam:   General:  Afebrile, no nausea, no vomiting and feeling slightly better. Still with diffuse wheezing, unable to speak in full sentences and complaining of CP due to ongoing coughing spells   Cardiovascular: S1 and S2, no rubs or gallops   Respiratory: diffuse exp wheezing, positive rhonchi;  no use of accessory muscles. Patient with fair air movement   Abdomen: soft, NT, positive BS  Musculoskeletal: no edema or cyanosis   Data Reviewed: Basic Metabolic Panel:  Recent Labs Lab 01/03/15 0013  NA 141  K 3.5  CL 107  CO2 27  GLUCOSE 91  BUN 7  CREATININE 0.76  CALCIUM 9.5   CBC:  Recent Labs Lab 01/03/15 0013  WBC 11.8*  NEUTROABS 7.5  HGB 12.9  HCT 40.6  MCV 82.0  PLT 268   Cardiac Enzymes:  Recent Labs Lab 01/03/15 0013  TROPONINI <0.03    Studies: Dg Chest Port 1 View  01/03/2015  CLINICAL DATA:  Central chest pain.  Shortness of breath. EXAM: PORTABLE CHEST 1 VIEW COMPARISON:  07/04/2014 FINDINGS: The heart size and mediastinal contours are within normal limits. Both lungs are clear. The visualized skeletal structures are unremarkable. IMPRESSION: Negative portable chest. Electronically Signed   By: Monte Fantasia M.D.   On: 01/03/2015 00:26    Scheduled Meds: . albuterol  2.5 mg Nebulization QID  . antiseptic oral rinse  7 mL Mouth Rinse BID  . budesonide (PULMICORT) nebulizer solution  0.25 mg Nebulization BID  . heparin  5,000 Units Subcutaneous 3 times per day  . levETIRAcetam  500 mg Oral BID  . methylPREDNISolone (SOLU-MEDROL) injection  60 mg Intravenous Q12H  . montelukast  10 mg Oral QHS  . oseltamivir  75 mg Oral BID  . pneumococcal 23 valent vaccine  0.5 mL Intramuscular  Tomorrow-1000   Continuous Infusions:   Active Problems:   Asthma exacerbation   Influenza-like illness    Time spent: 58 minutes    Barton Dubois  [facility] Pager [phone]. If 7PM-7AM, please contact night-coverage at [email or web address], password [facility] 01/04/2015, 11:00 AM  LOS: 1 day

## 2015-01-05 DIAGNOSIS — R569 Unspecified convulsions: Secondary | ICD-10-CM

## 2015-01-05 MED ORDER — DIPHENHYDRAMINE HCL 25 MG PO CAPS
25.0000 mg | ORAL_CAPSULE | ORAL | Status: DC | PRN
  Administered 2015-01-05 – 2015-01-06 (×2): 25 mg via ORAL
  Filled 2015-01-05 (×2): qty 1

## 2015-01-05 MED ORDER — ALBUTEROL SULFATE (2.5 MG/3ML) 0.083% IN NEBU
2.5000 mg | INHALATION_SOLUTION | RESPIRATORY_TRACT | Status: DC | PRN
Start: 2015-01-05 — End: 2015-01-07
  Administered 2015-01-05: 2.5 mg via RESPIRATORY_TRACT
  Filled 2015-01-05: qty 3

## 2015-01-05 MED ORDER — DIPHENHYDRAMINE HCL 50 MG PO CAPS
50.0000 mg | ORAL_CAPSULE | Freq: Once | ORAL | Status: AC
  Administered 2015-01-05: 50 mg via ORAL
  Filled 2015-01-05: qty 1

## 2015-01-05 MED ORDER — DIPHENHYDRAMINE HCL 25 MG PO CAPS
ORAL_CAPSULE | ORAL | Status: AC
  Filled 2015-01-05: qty 1

## 2015-01-05 NOTE — Care Management Note (Signed)
Case Management Note  Patient Details  Name: Sheryal Petrovic MRN: [id number] Date of Birth: [birth year]  Subjective/Objective:      24 yo admitted with Asthma Exacerbation              Action/Plan: From home with children  Expected Discharge Date:                  Expected Discharge Plan:  Home/Self Care  In-House Referral:     Discharge planning Services  CM Consult  Post Acute Care Choice:    Choice offered to:     DME Arranged:    DME Agency:     HH Arranged:    HH Agency:     Status of Service:  In process, will continue to follow  Medicare Important Message Given:    Date Medicare IM Given:    Medicare IM give by:    Date Additional Medicare IM Given:    Additional Medicare Important Message give by:     If discussed at [place] of Stay Meetings, dates discussed:    Additional Comments:  Lynnell Catalan, RN 01/05/2015, 3:06 PM

## 2015-01-05 NOTE — Progress Notes (Signed)
[facility] PROGRESS NOTE  Deitra Vater [id number] DOB: 1990/07/17 DOA: 01/02/2015 PCP: Brayton Caves, PA-C  Assessment/Plan: Active Problems:   Asthma exacerbation - Albuterol as needed - Solumedrol on board - continue pulmicort - wean to room air as tolerated    Influenza-like illness - continue supportive therapy  Seizure d/o - Keppra on board   Code Status: full Family Communication: none at bedside  Disposition Plan: barriers to discharge: supplemental oxygen use and DOE   Consultants:  None  Procedures:  None  Antibiotics:  None  HPI/Subjective: Pt has no new complaints. States she had DOE when she went to use the bathroom  Objective: Filed Vitals:   01/05/15 0418 01/05/15 0440  BP:  104/45  Pulse: 61 56  Temp:  98.5 F (36.9 C)  Resp: 18 16   No intake or output data in the 24 hours ending 01/05/15 1332 Filed Weights   01/03/15 0413  Weight: 75.751 kg (167 lb)    Exam:   General:  Pt is in nad, alert and awake  Cardiovascular: rrr, no mrg  Respiratory: cta bl, no wheezes, equal chest rise.  Abdomen: soft, NT, ND  Musculoskeletal:  No cyanosis   Data Reviewed: Basic Metabolic Panel:  Recent Labs Lab 01/03/15 0013  NA 141  K 3.5  CL 107  CO2 27  GLUCOSE 91  BUN 7  CREATININE 0.76  CALCIUM 9.5   Liver Function Tests: No results for input(s): AST, ALT, ALKPHOS, BILITOT, PROT, ALBUMIN in the last 168 hours. No results for input(s): LIPASE, AMYLASE in the last 168 hours. No results for input(s): AMMONIA in the last 168 hours. CBC:  Recent Labs Lab 01/03/15 0013  WBC 11.8*  NEUTROABS 7.5  HGB 12.9  HCT 40.6  MCV 82.0  PLT 268   Cardiac Enzymes:  Recent Labs Lab 01/03/15 0013  TROPONINI <0.03   BNP (last 3 results) No results for input(s): BNP in the last 8760 hours.  ProBNP (last 3 results) No results for input(s): PROBNP in the last 8760 hours.  CBG: No results for input(s): GLUCAP in the  last 168 hours.  No results found for this or any previous visit (from the past 240 hour(s)).   Studies: No results found.  Scheduled Meds: . albuterol  2.5 mg Nebulization QID  . antiseptic oral rinse  7 mL Mouth Rinse BID  . budesonide (PULMICORT) nebulizer solution  0.25 mg Nebulization BID  . diphenhydrAMINE      . heparin  5,000 Units Subcutaneous 3 times per day  . levETIRAcetam  500 mg Oral BID  . methylPREDNISolone (SOLU-MEDROL) injection  60 mg Intravenous Q12H  . montelukast  10 mg Oral QHS  . pneumococcal 23 valent vaccine  0.5 mL Intramuscular Tomorrow-1000   Continuous Infusions:   Time spent: > 35 minutes    Velvet Bathe  [facility] Pager [phone] If 7PM-7AM, please contact night-coverage at [email or web address], password [facility] 01/05/2015, 1:32 PM  LOS: 2 days

## 2015-01-06 MED ORDER — DIPHENHYDRAMINE HCL 25 MG PO CAPS
25.0000 mg | ORAL_CAPSULE | Freq: Once | ORAL | Status: AC
  Administered 2015-01-07: 25 mg via ORAL
  Filled 2015-01-06: qty 1

## 2015-01-06 NOTE — Progress Notes (Signed)
Patient ambulated with NT:  100% sitting at side of bed 75% walking with no oxygen 94% walking with oxygen

## 2015-01-06 NOTE — Progress Notes (Signed)
[facility] PROGRESS NOTE  Jasmine [name] [id number] DOB: 1990/05/17 DOA: 01/02/2015 PCP: Brayton Caves, PA-C  Assessment/Plan: Active Problems:   Asthma exacerbation - Albuterol as needed - continue Solumedrol - continue pulmicort - wean to room air as tolerated    Influenza-like illness - continue supportive therapy  Seizure d/o - Keppra on board  Code Status: full Family Communication: none at bedside  Disposition Plan: barriers to discharge: supplemental oxygen use and DOE   Consultants:  None  Procedures:  None  Antibiotics:  None  HPI/Subjective: Pt still feels sob and not at her baseline  Objective: Filed Vitals:   01/06/15 1325 01/06/15 1513  BP: 113/71   Pulse: 66 67  Temp: 98.1 F (36.7 C)   Resp: 18 16    Intake/Output Summary (Last 24 hours) at 01/06/15 1526 Last data filed at 01/05/15 1754  Gross per 24 hour  Intake    240 ml  Output      0 ml  Net    240 ml   Filed Weights   01/03/15 0413  Weight: 75.751 kg (167 lb)    Exam:   General:  Pt is in nad, alert and awake  Cardiovascular: rrr, no mrg  Respiratory: cta bl, no wheezes, equal chest rise.  Abdomen: soft, NT, ND  Musculoskeletal:  No cyanosis   Data Reviewed: Basic Metabolic Panel:  Recent Labs Lab 01/03/15 0013  NA 141  K 3.5  CL 107  CO2 27  GLUCOSE 91  BUN 7  CREATININE 0.76  CALCIUM 9.5   Liver Function Tests: No results for input(s): AST, ALT, ALKPHOS, BILITOT, PROT, ALBUMIN in the last 168 hours. No results for input(s): LIPASE, AMYLASE in the last 168 hours. No results for input(s): AMMONIA in the last 168 hours. CBC:  Recent Labs Lab 01/03/15 0013  WBC 11.8*  NEUTROABS 7.5  HGB 12.9  HCT 40.6  MCV 82.0  PLT 268   Cardiac Enzymes:  Recent Labs Lab 01/03/15 0013  TROPONINI <0.03   BNP (last 3 results) No results for input(s): BNP in the last 8760 hours.  ProBNP (last 3 results) No results for input(s): PROBNP in  the last 8760 hours.  CBG: No results for input(s): GLUCAP in the last 168 hours.  No results found for this or any previous visit (from the past 240 hour(s)).   Studies: No results found.  Scheduled Meds: . albuterol  2.5 mg Nebulization QID  . antiseptic oral rinse  7 mL Mouth Rinse BID  . budesonide (PULMICORT) nebulizer solution  0.25 mg Nebulization BID  . heparin  5,000 Units Subcutaneous 3 times per day  . levETIRAcetam  500 mg Oral BID  . methylPREDNISolone (SOLU-MEDROL) injection  60 mg Intravenous Q12H  . montelukast  10 mg Oral QHS  . pneumococcal 23 valent vaccine  0.5 mL Intramuscular Tomorrow-1000   Continuous Infusions:   Time spent: > 35 minutes    Velvet Bathe  [facility] Pager [phone] If 7PM-7AM, please contact night-coverage at [email or web address], password [facility] 01/06/2015, 3:26 PM  LOS: 3 days

## 2015-01-06 NOTE — Progress Notes (Signed)
Pt has declined throughout shift to have oxygen weaned or removed. Day shift nurse reported that oxygen was turned completely off, but pt wanted to keep cannula on even with no oxygen flowing through it. After pt took a shower I noted that she had placed herself back on 2L of O2. She said she is still getting SOB when she walks around the room, and she did appear SOB after her shower but O2 sat was WNL. This AM I have turned O2 down to 1L and sat has been WNL. Continue to monitor and try to wean O2. Hortencia Conradi RN

## 2015-01-07 MED ORDER — CALCIUM CARBONATE ANTACID 500 MG PO CHEW
1.0000 | CHEWABLE_TABLET | Freq: Three times a day (TID) | ORAL | Status: DC | PRN
  Administered 2015-01-07: 200 mg via ORAL
  Filled 2015-01-07: qty 1

## 2015-01-07 MED ORDER — METHYLPREDNISOLONE SODIUM SUCC 125 MG IJ SOLR: 60.0000 mg | Freq: Three times a day (TID) | INTRAMUSCULAR | Status: DC

## 2015-01-07 NOTE — Progress Notes (Signed)
Pt requested to leave the hospital against medical advice, Dr Wendee Beavers notified , [place] forms signed . Pt left with  her mother.

## 2015-01-07 NOTE — Progress Notes (Signed)
[facility] PROGRESS NOTE  Kwynn Mccullick [id number] DOB: 01/16/1991 DOA: 01/02/2015 PCP: Brayton Caves, PA-C  Assessment/Plan: Active Problems:   Asthma exacerbation - Albuterol scheduled and as needed - Will increase Solumedrol dose to 60 mg IV tid - continue pulmicort - wean to room air as tolerated - given continued hypoxia will obtain another chest x ray 2 view    Influenza-like illness - continue supportive therapy  Seizure d/o - Keppra on board  Code Status: full Family Communication: none at bedside  Disposition Plan: barriers to discharge: supplemental oxygen use and DOE   Consultants:  None  Procedures:  None  Antibiotics:  None  HPI/Subjective: Pt still feels sob and not at her baseline. With activity she reports continued sob  Objective: Filed Vitals:   01/06/15 2018 01/07/15 0502  BP: 132/77 141/64  Pulse: 69 68  Temp: 97.6 F (36.4 C) 98.5 F (36.9 C)  Resp: 16 16    Intake/Output Summary (Last 24 hours) at 01/07/15 1126 Last data filed at 01/07/15 1054  Gross per 24 hour  Intake    120 ml  Output      0 ml  Net    120 ml   Filed Weights   01/03/15 0413  Weight: 75.751 kg (167 lb)    Exam:   General:  Pt is in nad, alert and awake  Cardiovascular: rrr, no mrg  Respiratory: cta bl, no wheezes, equal chest rise.  Abdomen: soft, NT, ND  Musculoskeletal:  No cyanosis   Data Reviewed: Basic Metabolic Panel:  Recent Labs Lab 01/03/15 0013  NA 141  K 3.5  CL 107  CO2 27  GLUCOSE 91  BUN 7  CREATININE 0.76  CALCIUM 9.5   Liver Function Tests: No results for input(s): AST, ALT, ALKPHOS, BILITOT, PROT, ALBUMIN in the last 168 hours. No results for input(s): LIPASE, AMYLASE in the last 168 hours. No results for input(s): AMMONIA in the last 168 hours. CBC:  Recent Labs Lab 01/03/15 0013  WBC 11.8*  NEUTROABS 7.5  HGB 12.9  HCT 40.6  MCV 82.0  PLT 268   Cardiac Enzymes:  Recent Labs Lab  01/03/15 0013  TROPONINI <0.03   BNP (last 3 results) No results for input(s): BNP in the last 8760 hours.  ProBNP (last 3 results) No results for input(s): PROBNP in the last 8760 hours.  CBG: No results for input(s): GLUCAP in the last 168 hours.  No results found for this or any previous visit (from the past 240 hour(s)).   Studies: No results found.  Scheduled Meds: . albuterol  2.5 mg Nebulization QID  . antiseptic oral rinse  7 mL Mouth Rinse BID  . budesonide (PULMICORT) nebulizer solution  0.25 mg Nebulization BID  . heparin  5,000 Units Subcutaneous 3 times per day  . levETIRAcetam  500 mg Oral BID  . methylPREDNISolone (SOLU-MEDROL) injection  60 mg Intravenous 3 times per day  . montelukast  10 mg Oral QHS  . pneumococcal 23 valent vaccine  0.5 mL Intramuscular Tomorrow-1000   Continuous Infusions:   Time spent: > 35 minutes    Velvet Bathe  [facility] Pager [phone] If 7PM-7AM, please contact night-coverage at [email or web address], password [facility] 01/07/2015, 11:26 AM  LOS: 4 days

## 2015-01-07 NOTE — Care Management Note (Signed)
Case Management Note  Patient Details  Name: Jasmine [name] MRN: [id number] Date of Birth: 1990-08-28  Subjective/Objective:  Noted patient left AMA.                  Action/Plan:   Expected Discharge Date:                  Expected Discharge Plan:  Against Medical Advice  In-House Referral:     Discharge planning Services  CM Consult  Post Acute Care Choice:    Choice offered to:     DME Arranged:    DME Agency:     HH Arranged:    [place] Agency:     Status of Service:  Completed, signed off  Medicare Important Message Given:    Date Medicare IM Given:    Medicare IM give by:    Date Additional Medicare IM Given:    Additional Medicare Important Message give by:     If discussed at [place] of Stay Meetings, dates discussed:    Additional Comments:  Dessa Phi, RN 01/07/2015, 1:47 PM

## 2015-02-03 NOTE — Discharge Summary (Signed)
Patient left AMA despite conversation recommending continued therapy for her asthma exacerbation.  Johnryan Sao, [employer]

## 2015-04-19 ENCOUNTER — Emergency Department (HOSPITAL_COMMUNITY): Payer: Medicaid Other

## 2015-04-19 ENCOUNTER — Inpatient Hospital Stay (HOSPITAL_COMMUNITY)
Admission: EM | Admit: 2015-04-19 | Discharge: 2015-04-28 | DRG: 207 | Disposition: A | Discharge status: Home Health | Payer: Medicaid Other | Attending: Pulmonary Disease | Admitting: Pulmonary Disease

## 2015-04-19 ENCOUNTER — Encounter (HOSPITAL_COMMUNITY): Payer: Self-pay

## 2015-04-19 DIAGNOSIS — F419 Anxiety disorder, unspecified: Secondary | ICD-10-CM | POA: Diagnosis present

## 2015-04-19 DIAGNOSIS — J9600 Acute respiratory failure, unspecified whether with hypoxia or hypercapnia: Secondary | ICD-10-CM

## 2015-04-19 DIAGNOSIS — G40909 Epilepsy, unspecified, not intractable, without status epilepticus: Secondary | ICD-10-CM | POA: Diagnosis present

## 2015-04-19 DIAGNOSIS — Z4659 Encounter for fitting and adjustment of other gastrointestinal appliance and device: Secondary | ICD-10-CM

## 2015-04-19 DIAGNOSIS — J45901 Unspecified asthma with (acute) exacerbation: Secondary | ICD-10-CM | POA: Diagnosis not present

## 2015-04-19 DIAGNOSIS — Z87891 Personal history of nicotine dependence: Secondary | ICD-10-CM

## 2015-04-19 DIAGNOSIS — K219 Gastro-esophageal reflux disease without esophagitis: Secondary | ICD-10-CM | POA: Diagnosis not present

## 2015-04-19 DIAGNOSIS — J9602 Acute respiratory failure with hypercapnia: Secondary | ICD-10-CM | POA: Diagnosis present

## 2015-04-19 DIAGNOSIS — Z8249 Family history of ischemic heart disease and other diseases of the circulatory system: Secondary | ICD-10-CM | POA: Diagnosis not present

## 2015-04-19 DIAGNOSIS — D573 Sickle-cell trait: Secondary | ICD-10-CM | POA: Diagnosis not present

## 2015-04-19 DIAGNOSIS — R109 Unspecified abdominal pain: Secondary | ICD-10-CM

## 2015-04-19 DIAGNOSIS — F4323 Adjustment disorder with mixed anxiety and depressed mood: Secondary | ICD-10-CM | POA: Diagnosis present

## 2015-04-19 DIAGNOSIS — Z832 Family history of diseases of the blood and blood-forming organs and certain disorders involving the immune mechanism: Secondary | ICD-10-CM | POA: Diagnosis not present

## 2015-04-19 DIAGNOSIS — I10 Essential (primary) hypertension: Secondary | ICD-10-CM | POA: Diagnosis present

## 2015-04-19 DIAGNOSIS — Z7951 Long term (current) use of inhaled steroids: Secondary | ICD-10-CM | POA: Diagnosis not present

## 2015-04-19 DIAGNOSIS — Z79891 Long term (current) use of opiate analgesic: Secondary | ICD-10-CM

## 2015-04-19 DIAGNOSIS — D72829 Elevated white blood cell count, unspecified: Secondary | ICD-10-CM | POA: Diagnosis present

## 2015-04-19 DIAGNOSIS — R1013 Epigastric pain: Secondary | ICD-10-CM | POA: Diagnosis not present

## 2015-04-19 DIAGNOSIS — Z825 Family history of asthma and other chronic lower respiratory diseases: Secondary | ICD-10-CM | POA: Diagnosis not present

## 2015-04-19 DIAGNOSIS — R0602 Shortness of breath: Secondary | ICD-10-CM | POA: Diagnosis present

## 2015-04-19 DIAGNOSIS — E876 Hypokalemia: Secondary | ICD-10-CM | POA: Diagnosis present

## 2015-04-19 DIAGNOSIS — Z91018 Allergy to other foods: Secondary | ICD-10-CM | POA: Diagnosis present

## 2015-04-19 DIAGNOSIS — Z79899 Other long term (current) drug therapy: Secondary | ICD-10-CM | POA: Diagnosis not present

## 2015-04-19 DIAGNOSIS — J4541 Moderate persistent asthma with (acute) exacerbation: Secondary | ICD-10-CM | POA: Diagnosis not present

## 2015-04-19 DIAGNOSIS — R569 Unspecified convulsions: Secondary | ICD-10-CM | POA: Diagnosis not present

## 2015-04-19 DIAGNOSIS — J9620 Acute and chronic respiratory failure, unspecified whether with hypoxia or hypercapnia: Secondary | ICD-10-CM | POA: Diagnosis not present

## 2015-04-19 DIAGNOSIS — F411 Generalized anxiety disorder: Secondary | ICD-10-CM | POA: Diagnosis not present

## 2015-04-19 LAB — COMPREHENSIVE METABOLIC PANEL
ALK PHOS: 52 U/L (ref 38–126)
ALT: 27 U/L (ref 14–54)
AST: 27 U/L (ref 15–41)
Albumin: 4.3 g/dL (ref 3.5–5.0)
Anion gap: 8 (ref 5–15)
CALCIUM: 9.3 mg/dL (ref 8.9–10.3)
CHLORIDE: 111 mmol/L (ref 101–111)
CO2: 25 mmol/L (ref 22–32)
CREATININE: 0.71 mg/dL (ref 0.44–1.00)
GFR calc Af Amer: >60 mL/min (ref >60)
Glucose, Bld: 105 mg/dL — ABNORMAL HIGH (ref 65–99)
Potassium: 3.1 mmol/L — ABNORMAL LOW (ref 3.5–5.1)
Sodium: 144 mmol/L (ref 135–145)
Total Bilirubin: 0.4 mg/dL (ref 0.3–1.2)
Total Protein: 7.2 g/dL (ref 6.5–8.1)

## 2015-04-19 LAB — CBC WITH DIFFERENTIAL/PLATELET
BASOS ABS: 0 10*3/uL (ref 0.0–0.1)
Basophils Relative: 0 %
EOS PCT: 5 %
Eosinophils Absolute: 0.4 10*3/uL (ref 0.0–0.7)
HCT: 37.1 % (ref 36.0–46.0)
HEMOGLOBIN: 12.3 g/dL (ref 12.0–15.0)
LYMPHS ABS: 1.8 10*3/uL (ref 0.7–4.0)
LYMPHS PCT: 23 %
MCH: 27.2 pg (ref 26.0–34.0)
MCHC: 33.2 g/dL (ref 30.0–36.0)
MCV: 82.1 fL (ref 78.0–100.0)
Monocytes Absolute: 0.5 10*3/uL (ref 0.1–1.0)
Monocytes Relative: 6 %
NEUTROS ABS: 5.3 10*3/uL (ref 1.7–7.7)
NEUTROS PCT: 66 %
PLATELETS: 214 10*3/uL (ref 150–400)
RBC: 4.52 MIL/uL (ref 3.87–5.11)
RDW: 16.4 % — ABNORMAL HIGH (ref 11.5–15.5)
WBC: 8.1 10*3/uL (ref 4.0–10.5)

## 2015-04-19 LAB — PROTIME-INR
INR: 1.15 (ref 0.00–1.49)
Prothrombin Time: 14.5 seconds (ref 11.6–15.2)

## 2015-04-19 MED ORDER — DM-GUAIFENESIN ER 30-600 MG PO TB12
1.0000 | ORAL_TABLET | Freq: Two times a day (BID) | ORAL | Status: DC
  Administered 2015-04-20: 1 via ORAL
  Filled 2015-04-19: qty 1

## 2015-04-19 MED ORDER — EPINEPHRINE HCL 1 MG/ML IJ SOLN: 1.0000 mg | Freq: Once | INTRAMUSCULAR | Status: DC

## 2015-04-19 MED ORDER — POTASSIUM CHLORIDE 20 MEQ/15ML (10%) PO SOLN
20.0000 meq | Freq: Once | ORAL | Status: AC
  Administered 2015-04-20: 20 meq via ORAL
  Filled 2015-04-19: qty 15

## 2015-04-19 MED ORDER — LEVOFLOXACIN IN D5W 500 MG/100ML IV SOLN
500.0000 mg | Freq: Every day | INTRAVENOUS | Status: DC
  Administered 2015-04-20 – 2015-04-23 (×5): 500 mg via INTRAVENOUS
  Filled 2015-04-19 (×5): qty 100

## 2015-04-19 MED ORDER — KETOROLAC TROMETHAMINE 30 MG/ML IJ SOLN
30.0000 mg | Freq: Once | INTRAMUSCULAR | Status: AC
  Administered 2015-04-19: 30 mg via INTRAVENOUS
  Filled 2015-04-19: qty 1

## 2015-04-19 MED ORDER — MAGNESIUM SULFATE 2 GM/50ML IV SOLN
2.0000 g | Freq: Once | INTRAVENOUS | Status: AC
  Administered 2015-04-19: 2 g via INTRAVENOUS
  Filled 2015-04-19: qty 50

## 2015-04-19 MED ORDER — METHYLPREDNISOLONE SODIUM SUCC 125 MG IJ SOLR: 60.0000 mg | Freq: Three times a day (TID) | INTRAMUSCULAR | Status: DC

## 2015-04-19 MED ORDER — ALBUTEROL SULFATE (2.5 MG/3ML) 0.083% IN NEBU
5.0000 mg | INHALATION_SOLUTION | Freq: Once | RESPIRATORY_TRACT | Status: AC
  Administered 2015-04-19: 5 mg via RESPIRATORY_TRACT
  Filled 2015-04-19: qty 6

## 2015-04-19 MED ORDER — DIPHENHYDRAMINE HCL 50 MG/ML IJ SOLN
25.0000 mg | Freq: Once | INTRAMUSCULAR | Status: AC
  Administered 2015-04-19: 25 mg via INTRAVENOUS
  Filled 2015-04-19: qty 1

## 2015-04-19 MED ORDER — LEVALBUTEROL HCL 1.25 MG/0.5ML IN NEBU
1.2500 mg | INHALATION_SOLUTION | Freq: Four times a day (QID) | RESPIRATORY_TRACT | Status: DC
  Administered 2015-04-19: 1.25 mg via RESPIRATORY_TRACT
  Filled 2015-04-19: qty 0.5

## 2015-04-19 MED ORDER — IPRATROPIUM BROMIDE 0.02 % IN SOLN
1.0000 mg | Freq: Once | RESPIRATORY_TRACT | Status: AC
  Administered 2015-04-19: 1 mg via RESPIRATORY_TRACT
  Filled 2015-04-19: qty 5

## 2015-04-19 MED ORDER — METHYLPREDNISOLONE SODIUM SUCC 125 MG IJ SOLR
125.0000 mg | Freq: Once | INTRAMUSCULAR | Status: AC
  Administered 2015-04-19: 125 mg via INTRAVENOUS
  Filled 2015-04-19: qty 2

## 2015-04-19 MED ORDER — CYCLOBENZAPRINE HCL 5 MG PO TABS
7.5000 mg | ORAL_TABLET | Freq: Three times a day (TID) | ORAL | Status: DC | PRN
  Administered 2015-04-20: 7.5 mg via ORAL
  Filled 2015-04-19: qty 2

## 2015-04-19 MED ORDER — IPRATROPIUM BROMIDE 0.02 % IN SOLN
0.5000 mg | RESPIRATORY_TRACT | Status: DC
  Administered 2015-04-19: 0.5 mg via RESPIRATORY_TRACT
  Filled 2015-04-19: qty 2.5

## 2015-04-19 MED ORDER — ALBUTEROL (5 MG/ML) CONTINUOUS INHALATION SOLN
15.0000 mg/h | INHALATION_SOLUTION | RESPIRATORY_TRACT | Status: DC
  Administered 2015-04-19: 15 mg/h via RESPIRATORY_TRACT
  Filled 2015-04-19: qty 20

## 2015-04-19 MED ORDER — IPRATROPIUM-ALBUTEROL 0.5-2.5 (3) MG/3ML IN SOLN
3.0000 mL | RESPIRATORY_TRACT | Status: DC
Start: 2015-04-20 — End: 2015-04-19
  Administered 2015-04-19: 3 mL via RESPIRATORY_TRACT
  Filled 2015-04-19: qty 3

## 2015-04-19 MED ORDER — SODIUM CHLORIDE 0.9 % IV SOLN
INTRAVENOUS | Status: DC
  Administered 2015-04-20: 01:00:00 via INTRAVENOUS
  Administered 2015-04-20: 1000 mL via INTRAVENOUS
  Administered 2015-04-22: 11:00:00 via INTRAVENOUS
  Administered 2015-04-23: 1000 mL via INTRAVENOUS
  Administered 2015-04-23: 06:00:00 via INTRAVENOUS
  Administered 2015-04-24: 1000 mL via INTRAVENOUS

## 2015-04-19 MED ORDER — LEVETIRACETAM 500 MG PO TABS
500.0000 mg | ORAL_TABLET | Freq: Two times a day (BID) | ORAL | Status: DC
  Administered 2015-04-20: 500 mg via ORAL
  Filled 2015-04-19: qty 1

## 2015-04-19 MED ORDER — IPRATROPIUM BROMIDE 0.02 % IN SOLN: 0.5000 mg | RESPIRATORY_TRACT | Status: DC

## 2015-04-19 MED ORDER — SODIUM CHLORIDE 0.9 % IV BOLUS (SEPSIS)
1000.0000 mL | Freq: Once | INTRAVENOUS | Status: AC
  Administered 2015-04-19: 1000 mL via INTRAVENOUS

## 2015-04-19 MED ORDER — ENOXAPARIN SODIUM 40 MG/0.4ML ~~LOC~~ SOLN
40.0000 mg | Freq: Every day | SUBCUTANEOUS | Status: DC
  Administered 2015-04-20 – 2015-04-27 (×9): 40 mg via SUBCUTANEOUS
  Filled 2015-04-19 (×9): qty 0.4

## 2015-04-19 NOTE — Progress Notes (Signed)
Pharmacy Antibiotic Note  Jasmine [name] is a 25 y.o. female admitted on 04/19/2015 with asthma exaxcerbation.  Pharmacy has been consulted for Levofloxacin dosing.    Plan: Levofloxacin 500mg  iv q24hr     Temp (24hrs), Avg:98.5 F (36.9 C), Min:98.5 F (36.9 C), Max:98.5 F (36.9 C)   Recent Labs Lab 04/19/15 2024  WBC 8.1  CREATININE 0.71    CrCl cannot be calculated (Unknown ideal weight.).    Allergies  Allergen Reactions  . Avocado Anaphylaxis  . Cheese Shortness Of Breath    Asthma trigger  . Chocolate Shortness Of Breath    Asthma trigger  . Ibuprofen Hives and Shortness Of Breath    Tolerates naproxen  . Orange Juice [Orange Oil] Shortness Of Breath  . Other Hives    ALLERGIC TO ALL STEROIDS       EXCEPT IV SOLU MEDROL  . Peach [Prunus Persica] Anaphylaxis  . Peanuts [Peanut Oil] Anaphylaxis  . Pear Anaphylaxis  . Prednisone Hives  . Raspberry Anaphylaxis  . Tylenol [Acetaminophen] Hives and Shortness Of Breath  . Amoxicillin Hives    Has patient had a PCN reaction causing immediate rash, facial/tongue/throat swelling, SOB or lightheadedness with hypotension:Yes Has patient had a PCN reaction causing severe rash involving mucus membranes or skin necrosis:No Has patient had a PCN reaction that required hospitalization:no Has patient had a PCN reaction occurring within the last 10 years:No If all of the above answers are "NO", then may proceed with Cephalosporin use.     Marland Kitchen Doxycycline Hives  . Erythromycin Hives  . Ivp Dye [Iodinated Diagnostic Agents]     Shortness of breath   . Latex     Swelling/anaphylaxis  . Milk Of Magnesia [Magnesium Hydroxide] Hives and Itching  . Penicillins Hives    Has patient had a PCN reaction causing immediate rash, facial/tongue/throat swelling, SOB or lightheadedness with hypotension: Yes Has patient had a PCN reaction causing severe rash involving mucus membranes or skin necrosis: No Has patient had a PCN reaction that  required hospitalization Yes Has patient had a PCN reaction occurring within the last 10 years: No If all of the above answers are "NO", then may proceed with Cephalosporin use.     Antimicrobials this admission: Levofloxacin 4/4 >>   Microbiology results: Pending  Thank you for allowing pharmacy to be a part of this patient's care.  Nani Skillern Crowford 04/19/2015 11:15 PM

## 2015-04-19 NOTE — ED Notes (Signed)
Patient transported to X-ray 

## 2015-04-19 NOTE — ED Notes (Signed)
Patient ambulatory to restroom without assistance. 

## 2015-04-19 NOTE — ED Notes (Signed)
Patient states she woke this AM with a non productive cough, SOB , and wheezing. Patient states she has had neb treatnments x 2 , spirva and albuterol inhaler.

## 2015-04-19 NOTE — H&P (Signed)
[facility] History and Physical  Claudio Gaillard [id number] DOB: 1990-10-25 DOA: 04/19/2015  Referring physician: ED physician PCP: Brayton Caves, PA-C  Specialists:   Chief Complaint: Dry cough and shortness of breath  HPI: Jasmine [name] is a 25 y.o. female with PMH asthma, history of multiple intubation due to asthma exacerbation, seizure, sickle cell trait, pancreatitis, GERD, who presents with dry cough and shortness of breath.  Patient reports that she has dry cough, shortness of breath and wheezing for several days, which have been progressively getting worse. She has chills, but no fever, no runny nose also throat. She has mild chest pain, which she attributs to coughing. She also has muscle spasm in left back. Patient does not have abdominal pain, diarrhea, symptoms of UTI or unilateral weakness. No tenderness over [place].  In ED, patient was found to have WBC 8.1, temperature normal, tachycardia, tachypnea, potassium 3.1, creatinine normal. Chest x-ray has bronchitis change, but no infiltration. Patient is admitted to inpatient for further eval and treatment.  EKG: Not done in ED, will get one.   Where does patient live?   At home  Can patient participate in ADLs?  Yes Review of Systems:   General: no fevers, has chills, no changes in body weight, has poor appetite, has fatigue HEENT: no blurry vision, hearing changes or sore throat Pulm: has dyspnea, coughing, wheezing CV: has chest pain, no palpitations Abd: no nausea, vomiting, abdominal pain, diarrhea, constipation GU: no dysuria, burning on urination, increased urinary frequency, hematuria  Ext: no leg edema Neuro: no unilateral weakness, numbness, or tingling, no vision change or hearing loss Skin: no rash MSK: No muscle spasm, no deformity, no limitation of range of movement in spin. Has muscle spasm over left back. Heme: No easy bruising.  Travel history: No recent long distant travel.  Allergy:   Allergies  Allergen Reactions  . Avocado Anaphylaxis  . Cheese Shortness Of Breath    Asthma trigger  . Chocolate Shortness Of Breath    Asthma trigger  . Ibuprofen Hives and Shortness Of Breath    Tolerates naproxen  . Orange Juice [Orange Oil] Shortness Of Breath  . Other Hives    ALLERGIC TO ALL STEROIDS       EXCEPT IV SOLU MEDROL  . Peach [Prunus Persica] Anaphylaxis  . Peanuts [Peanut Oil] Anaphylaxis  . Pear Anaphylaxis  . Prednisone Hives  . Raspberry Anaphylaxis  . Tylenol [Acetaminophen] Hives and Shortness Of Breath  . Amoxicillin Hives    Has patient had a PCN reaction causing immediate rash, facial/tongue/throat swelling, SOB or lightheadedness with hypotension:Yes Has patient had a PCN reaction causing severe rash involving mucus membranes or skin necrosis:No Has patient had a PCN reaction that required hospitalization:no Has patient had a PCN reaction occurring within the last 10 years:No If all of the above answers are "NO", then may proceed with Cephalosporin use.     Marland Kitchen Doxycycline Hives  . Erythromycin Hives  . Ivp Dye [Iodinated Diagnostic Agents]     Shortness of breath   . Latex     Swelling/anaphylaxis  . Milk Of Magnesia [Magnesium Hydroxide] Hives and Itching  . Penicillins Hives    Has patient had a PCN reaction causing immediate rash, facial/tongue/throat swelling, SOB or lightheadedness with hypotension: Yes Has patient had a PCN reaction causing severe rash involving mucus membranes or skin necrosis: No Has patient had a PCN reaction that required hospitalization Yes Has patient had a PCN reaction occurring within the last 10  years: No If all of the above answers are "NO", then may proceed with Cephalosporin use.     Past Medical History  Diagnosis Date  . Asthma   . Sickle cell trait ([place])   . Pancreatitis   . GERD (gastroesophageal reflux disease)   . Seizures ([place])     epilepsy  . Heart murmur     last work up age 89- no symtoms     Past Surgical History  Procedure Laterality Date  . Nerve, tendon and artery repair Right 09/23/2012    Procedure: I&D and Repair As Necessary/Right Hand and Palm;  Surgeon: Roseanne Kaufman, MD;  Location: [facility];  Service: Orthopedics;  Laterality: Right;  . Tonsillectomy    . Hernia repair    . Cesarean section      x 1  . Hand surgery    . Appendectomy    . Cesarean section N/A 11/08/2014    Procedure: CESAREAN SECTION;  Surgeon: Truett Mainland, DO;  Location: [facility];  Service: Obstetrics;  Laterality: N/A;    Social History:  reports that she quit smoking about 12 months ago. Her smoking use included Cigarettes. She has never used smokeless tobacco. She reports that she does not drink alcohol or use illicit drugs.  Family History:  Family History  Problem Relation Age of Onset  . Cancer Mother     cervical cancer  . Asthma Mother   . Hypertension Father   . Sickle cell anemia Father   . Asthma Sister   . Diabetes Maternal Aunt   . Cancer Maternal Grandmother      Prior to Admission medications   Medication Sig Start Date End Date Taking? Authorizing Provider  albuterol (PROVENTIL HFA;VENTOLIN HFA) 108 (90 BASE) MCG/ACT inhaler Inhale 2 puffs into the lungs every 4 (four) hours as needed for wheezing or shortness of breath. 07/06/14  Yes Shanker Kristeen Mans, MD  albuterol (PROVENTIL) (2.5 MG/3ML) 0.083% nebulizer solution Take 3 mLs (2.5 mg total) by nebulization every 4 (four) hours as needed for wheezing or shortness of breath. 07/06/14  Yes Shanker Kristeen Mans, MD  tiotropium (SPIRIVA) 18 MCG inhalation capsule Place 18 mcg into inhaler and inhale daily.   Yes Historical Provider, MD  cyclobenzaprine (FLEXERIL) 10 MG tablet Reported on 04/19/2015 09/30/14   Historical Provider, MD  EPINEPHrine (ADRENALIN) 1 MG/ML injection Inject 1 mL (1 mg total) into the muscle once. 06/10/14   Nila Nephew, MD  Fluticasone-Salmeterol (ADVAIR DISKUS) 250-50 MCG/DOSE AEPB Inhale 1 puff into the  lungs 2 (two) times daily. Patient not taking: Reported on 04/19/2015 07/06/14   Jonetta Osgood, MD  levETIRAcetam (KEPPRA) 500 MG tablet Take 1 tablet (500 mg total) by mouth 2 (two) times daily. 07/06/14   Shanker Kristeen Mans, MD  montelukast (SINGULAIR) 10 MG tablet Take 1 tablet (10 mg total) by mouth at bedtime. Patient not taking: Reported on 04/19/2015 07/06/14   Jonetta Osgood, MD  oxyCODONE (OXY IR/ROXICODONE) 5 MG immediate release tablet Take 1 tablet (5 mg total) by mouth every 4 (four) hours as needed for moderate pain. Patient not taking: Reported on 04/19/2015 11/11/14   Keane Scrape, MD  Prenatal Multivit-Min-Fe-FA (PRENATAL VITAMINS) 0.8 MG tablet Take 1 tablet by mouth daily. Patient not taking: Reported on 01/03/2015 06/10/14   Nila Nephew, MD    Physical Exam: Filed Vitals:   04/19/15 1856 04/19/15 2141  BP: 160/59 135/68  Pulse: 105 106  Temp: 98.5 F (36.9 C)   TempSrc:  Oral   Resp: 24 18  SpO2: 100% 99%   General: Not in acute distress HEENT:       Eyes: PERRL, EOMI, no scleral icterus.       ENT: No discharge from the ears and nose, no pharynx injection, no tonsillar enlargement.        Neck: No JVD, no bruit, no mass felt. Heme: No neck lymph node enlargement. Cardiac: S1/S2, RRR, No murmurs, No gallops or rubs. Pulm: Severely decreased air movement bilaterally. Has mild wheezing. No rales or rubs. Abd: Soft, nondistended, nontender, no rebound pain, no organomegaly, BS present. Ext: No pitting leg edema bilaterally. 2+DP/PT pulse bilaterally. Musculoskeletal: No joint deformities, No joint redness or warmth, no limitation of ROM in spin. Skin: No rashes.  Neuro: Alert, oriented X3, cranial nerves II-XII grossly intact, moves all extremities normally.  Psych: Patient is not psychotic, no suicidal or hemocidal ideation.  Labs on Admission:  Basic Metabolic Panel:  Recent Labs Lab 04/19/15 2024  NA 144  K 3.1*  CL 111  CO2 25  GLUCOSE 105*  BUN  <5*  CREATININE 0.71  CALCIUM 9.3   Liver Function Tests:  Recent Labs Lab 04/19/15 2024  AST 27  ALT 27  ALKPHOS 52  BILITOT 0.4  PROT 7.2  ALBUMIN 4.3   No results for input(s): LIPASE, AMYLASE in the last 168 hours. No results for input(s): AMMONIA in the last 168 hours. CBC:  Recent Labs Lab 04/19/15 2024  WBC 8.1  NEUTROABS 5.3  HGB 12.3  HCT 37.1  MCV 82.1  PLT 214   Cardiac Enzymes: No results for input(s): CKTOTAL, CKMB, CKMBINDEX, TROPONINI in the last 168 hours.  BNP (last 3 results) No results for input(s): BNP in the last 8760 hours.  ProBNP (last 3 results) No results for input(s): PROBNP in the last 8760 hours.  CBG: No results for input(s): GLUCAP in the last 168 hours.  Radiological Exams on Admission: Dg Chest 2 View  04/19/2015  CLINICAL DATA:  Shortness of breath. Nonproductive cough and wheezing EXAM: CHEST  2 VIEW COMPARISON:  01/02/2015 FINDINGS: Bronchitic markings most convincing on the lateral projection where there is cuffing and infrahilar tram track appearance. There is no edema, consolidation, effusion, or pneumothorax. Interstitial crowding in the frontal projection. IMPRESSION: Bronchitic changes.  Negative for pneumonia or collapse. Electronically Signed   By: Monte Fantasia M.D.   On: 04/19/2015 19:50    Assessment/Plan Principal Problem:   Asthma exacerbation Active Problems:   History of food anaphylaxis   GERD (gastroesophageal reflux disease)   Hypokalemia   Asthma with acute exacerbation   Asthma exacerbation: Patient has severely decreased air movement bilaterally, consistent with asthma exacerbation. No infiltration on chest x-ray. She has hx of multiple intubation due to asthma exacerbation, at high risk of deteriorating.   -will admit patient to SDU -Nebulizers: scheduled Atroven q3h and prn Xopenex Nebs -Solu-Medrol 60 mg IV q8h -Mucinex for cough -start Levaquin -Urine pneumococcal antigen -follow up sputum  culture, Flu pcr, respiratory virus panel -keep pt NPO in case she needs intubation -check ABG and preg test -prn BiPAP  Hypokalemia: K= 3.1on admission. - Repleted  - Check Mg level  History of seizure disorder: Patient is on Keppra. - Will continue Keppra -Check Keppra level  GERD: -Pepcide  DVT ppx:  SQ Lovenox  Code Status: Full code Family Communication:   Yes, patient's mother  at bed side Disposition Plan: Admit to inpatient   Date of Service  04/19/2015    Ivor Costa [facility] Pager [phone]  If 7PM-7AM, please contact night-coverage [email or web address] Password [email or web address] 04/19/2015, 11:15 PM

## 2015-04-20 ENCOUNTER — Inpatient Hospital Stay (HOSPITAL_COMMUNITY): Payer: Medicaid Other | Admitting: Anesthesiology

## 2015-04-20 ENCOUNTER — Inpatient Hospital Stay (HOSPITAL_COMMUNITY): Payer: Medicaid Other

## 2015-04-20 DIAGNOSIS — J45901 Unspecified asthma with (acute) exacerbation: Principal | ICD-10-CM

## 2015-04-20 DIAGNOSIS — K219 Gastro-esophageal reflux disease without esophagitis: Secondary | ICD-10-CM

## 2015-04-20 LAB — PROCALCITONIN: Procalcitonin: <0.1 ng/mL

## 2015-04-20 LAB — BLOOD GAS, ARTERIAL
ACID-BASE DEFICIT: 6.4 mmol/L — AB (ref 0.0–2.0)
ACID-BASE DEFICIT: 5.6 mmol/L — AB (ref 0.0–2.0)
Acid-base deficit: 12.6 mmol/L — ABNORMAL HIGH (ref 0.0–2.0)
BICARBONATE: 12.8 meq/L — AB (ref 20.0–24.0)
BICARBONATE: 18.6 meq/L — AB (ref 20.0–24.0)
Bicarbonate: 18.4 mEq/L — ABNORMAL LOW (ref 20.0–24.0)
Drawn by: 11249
Drawn by: 103701
Drawn by: 103701
FIO2: 0.4
FIO2: 1
FIO2: 0.21
LHR: 10 {breaths}/min
Mode: POSITIVE
O2 SAT: 99.2 %
O2 SAT: 99.9 %
O2 SAT: 98.5 %
PATIENT TEMPERATURE: 98.6
PATIENT TEMPERATURE: 98.5
PCO2 ART: 28.7 mmHg — AB (ref 35.0–45.0)
PEEP/CPAP: 5 cmH2O
PEEP: 5 cmH2O
PH ART: 7.271 — AB (ref 7.350–7.450)
PH ART: 7.319 — AB (ref 7.350–7.450)
PO2 ART: 210 mmHg — AB (ref 80.0–100.0)
PO2 ART: 124 mmHg — AB (ref 80.0–100.0)
Patient temperature: 98.6
Pressure control: 10 cmH2O
RATE: 14 resp/min
TCO2: 12 mmol/L (ref 0–100)
TCO2: 17.4 mmol/L (ref 0–100)
TCO2: 19.3 mmol/L (ref 0–100)
VT: 450 mL
pCO2 arterial: 30.5 mmHg — ABNORMAL LOW (ref 35.0–45.0)
pCO2 arterial: 37.3 mmHg (ref 35.0–45.0)
pH, Arterial: 7.396 (ref 7.350–7.450)
pO2, Arterial: 444 mmHg — ABNORMAL HIGH (ref 80.0–100.0)

## 2015-04-20 LAB — GLUCOSE, CAPILLARY
GLUCOSE-CAPILLARY: 177 mg/dL — AB (ref 65–99)
GLUCOSE-CAPILLARY: 158 mg/dL — AB (ref 65–99)
GLUCOSE-CAPILLARY: 119 mg/dL — AB (ref 65–99)
GLUCOSE-CAPILLARY: 107 mg/dL — AB (ref 65–99)

## 2015-04-20 LAB — MAGNESIUM: Magnesium: 2.2 mg/dL (ref 1.7–2.4)

## 2015-04-20 LAB — HIV ANTIBODY (ROUTINE TESTING W REFLEX): HIV SCREEN 4TH GENERATION: NONREACTIVE

## 2015-04-20 LAB — INFLUENZA PANEL BY PCR (TYPE A & B)
H1N1 flu by pcr: NOT DETECTED
INFLBPCR: NEGATIVE
Influenza A By PCR: NEGATIVE

## 2015-04-20 LAB — MRSA PCR SCREENING: MRSA by PCR: NEGATIVE

## 2015-04-20 LAB — HCG, QUANTITATIVE, PREGNANCY: hCG, Beta Chain, Quant, S: <1 m[IU]/mL (ref <5)

## 2015-04-20 LAB — STREP PNEUMONIAE URINARY ANTIGEN: STREP PNEUMO URINARY ANTIGEN: NEGATIVE

## 2015-04-20 LAB — TRIGLYCERIDES: Triglycerides: 55 mg/dL (ref <150)

## 2015-04-20 MED ORDER — OXYCODONE HCL 5 MG PO TABS
5.0000 mg | ORAL_TABLET | ORAL | Status: DC | PRN
  Administered 2015-04-20: 10 mg via ORAL
  Filled 2015-04-20: qty 2

## 2015-04-20 MED ORDER — PROPOFOL 1000 MG/100ML IV EMUL
INTRAVENOUS | Status: AC
  Filled 2015-04-20: qty 100

## 2015-04-20 MED ORDER — FENTANYL CITRATE (PF) 100 MCG/2ML IJ SOLN: 50.0000 ug | Freq: Once | INTRAMUSCULAR | Status: AC

## 2015-04-20 MED ORDER — ALBUTEROL SULFATE (2.5 MG/3ML) 0.083% IN NEBU
5.0000 mg | INHALATION_SOLUTION | RESPIRATORY_TRACT | Status: AC
  Administered 2015-04-20 (×4): 5 mg via RESPIRATORY_TRACT
  Filled 2015-04-20 (×4): qty 6

## 2015-04-20 MED ORDER — FENTANYL CITRATE (PF) 100 MCG/2ML IJ SOLN
INTRAMUSCULAR | Status: AC
  Administered 2015-04-20: 100 ug
  Filled 2015-04-20: qty 2

## 2015-04-20 MED ORDER — MIDAZOLAM HCL 2 MG/2ML IJ SOLN
INTRAMUSCULAR | Status: AC
  Administered 2015-04-20: 2 mg
  Filled 2015-04-20: qty 2

## 2015-04-20 MED ORDER — FAMOTIDINE IN NACL 20-0.9 MG/50ML-% IV SOLN
20.0000 mg | INTRAVENOUS | Status: DC
  Administered 2015-04-20: 20 mg via INTRAVENOUS
  Filled 2015-04-20: qty 50

## 2015-04-20 MED ORDER — SODIUM CHLORIDE 0.9 % IV SOLN
500.0000 mg | Freq: Two times a day (BID) | INTRAVENOUS | Status: DC
  Administered 2015-04-20 – 2015-04-22 (×6): 500 mg via INTRAVENOUS
  Filled 2015-04-20 (×7): qty 5

## 2015-04-20 MED ORDER — PANTOPRAZOLE SODIUM 40 MG IV SOLR
40.0000 mg | Freq: Every day | INTRAVENOUS | Status: DC
  Administered 2015-04-20 – 2015-04-26 (×7): 40 mg via INTRAVENOUS
  Filled 2015-04-20 (×8): qty 40

## 2015-04-20 MED ORDER — PROPOFOL 1000 MG/100ML IV EMUL
0.0000 ug/kg/min | INTRAVENOUS | Status: DC
  Administered 2015-04-20: 50 ug/kg/min via INTRAVENOUS
  Administered 2015-04-20 – 2015-04-21 (×3): 35 ug/kg/min via INTRAVENOUS
  Administered 2015-04-21 (×2): 40 ug/kg/min via INTRAVENOUS
  Administered 2015-04-21: 35 ug/kg/min via INTRAVENOUS
  Administered 2015-04-22 (×2): 40 ug/kg/min via INTRAVENOUS
  Filled 2015-04-20 (×8): qty 100

## 2015-04-20 MED ORDER — ANTISEPTIC ORAL RINSE SOLUTION (CORINZ)
7.0000 mL | Freq: Four times a day (QID) | OROMUCOSAL | Status: DC
  Administered 2015-04-21 – 2015-04-26 (×21): 7 mL via OROMUCOSAL

## 2015-04-20 MED ORDER — LORAZEPAM 2 MG/ML IJ SOLN
INTRAMUSCULAR | Status: AC
  Filled 2015-04-20: qty 1

## 2015-04-20 MED ORDER — METHYLPREDNISOLONE SODIUM SUCC 125 MG IJ SOLR
60.0000 mg | Freq: Four times a day (QID) | INTRAMUSCULAR | Status: DC
  Administered 2015-04-20 – 2015-04-22 (×8): 60 mg via INTRAVENOUS
  Filled 2015-04-20 (×8): qty 2

## 2015-04-20 MED ORDER — SUCCINYLCHOLINE CHLORIDE 20 MG/ML IJ SOLN
INTRAMUSCULAR | Status: DC | PRN
  Administered 2015-04-20: 100 mg via INTRAVENOUS

## 2015-04-20 MED ORDER — CHLORHEXIDINE GLUCONATE 0.12% ORAL RINSE (MEDLINE KIT)
15.0000 mL | Freq: Two times a day (BID) | OROMUCOSAL | Status: DC
  Administered 2015-04-20 – 2015-04-26 (×13): 15 mL via OROMUCOSAL

## 2015-04-20 MED ORDER — EPINEPHRINE 0.3 MG/0.3ML IJ SOAJ
0.3000 mg | INTRAMUSCULAR | Status: DC | PRN
  Filled 2015-04-20: qty 0.3

## 2015-04-20 MED ORDER — METHYLPREDNISOLONE SODIUM SUCC 125 MG IJ SOLR
60.0000 mg | Freq: Three times a day (TID) | INTRAMUSCULAR | Status: DC
  Administered 2015-04-20: 60 mg via INTRAVENOUS
  Filled 2015-04-20: qty 2

## 2015-04-20 MED ORDER — IPRATROPIUM BROMIDE 0.02 % IN SOLN
0.5000 mg | RESPIRATORY_TRACT | Status: DC
  Administered 2015-04-20 – 2015-04-22 (×12): 0.5 mg via RESPIRATORY_TRACT
  Filled 2015-04-20 (×12): qty 2.5

## 2015-04-20 MED ORDER — ALBUTEROL SULFATE (2.5 MG/3ML) 0.083% IN NEBU
5.0000 mg | INHALATION_SOLUTION | RESPIRATORY_TRACT | Status: AC
  Administered 2015-04-20 (×3): 5 mg via RESPIRATORY_TRACT
  Filled 2015-04-20 (×3): qty 6

## 2015-04-20 MED ORDER — IPRATROPIUM-ALBUTEROL 0.5-2.5 (3) MG/3ML IN SOLN
3.0000 mL | RESPIRATORY_TRACT | Status: DC
  Administered 2015-04-20 (×2): 3 mL via RESPIRATORY_TRACT
  Filled 2015-04-20 (×2): qty 3

## 2015-04-20 MED ORDER — SODIUM CHLORIDE 0.9 % IV SOLN
25.0000 ug/h | INTRAVENOUS | Status: DC
  Administered 2015-04-20: 50 ug/h via INTRAVENOUS
  Administered 2015-04-21: 400 ug/h via INTRAVENOUS
  Administered 2015-04-21: 150 ug/h via INTRAVENOUS
  Administered 2015-04-22 – 2015-04-23 (×7): 400 ug/h via INTRAVENOUS
  Administered 2015-04-24: 50 ug/h via INTRAVENOUS
  Administered 2015-04-24 (×4): 400 ug/h via INTRAVENOUS
  Administered 2015-04-25: 50 ug/h via INTRAVENOUS
  Administered 2015-04-25: 100 ug/h via INTRAVENOUS
  Administered 2015-04-25: 400 ug/h via INTRAVENOUS
  Filled 2015-04-20 (×18): qty 50

## 2015-04-20 MED ORDER — ALBUTEROL SULFATE (2.5 MG/3ML) 0.083% IN NEBU: 5.0000 mg | INHALATION_SOLUTION | RESPIRATORY_TRACT | Status: DC

## 2015-04-20 MED ORDER — ALBUTEROL (5 MG/ML) CONTINUOUS INHALATION SOLN
10.0000 mg/h | INHALATION_SOLUTION | RESPIRATORY_TRACT | Status: AC
  Administered 2015-04-20: 10 mg/h via RESPIRATORY_TRACT
  Filled 2015-04-20: qty 20

## 2015-04-20 MED ORDER — ALBUTEROL SULFATE (2.5 MG/3ML) 0.083% IN NEBU: 2.5000 mg | INHALATION_SOLUTION | RESPIRATORY_TRACT | Status: DC | PRN

## 2015-04-20 MED ORDER — CETYLPYRIDINIUM CHLORIDE 0.05 % MT LIQD: 7.0000 mL | Freq: Two times a day (BID) | OROMUCOSAL | Status: DC

## 2015-04-20 MED ORDER — PROPOFOL 10 MG/ML IV BOLUS
INTRAVENOUS | Status: DC | PRN
  Administered 2015-04-20: 150 mg via INTRAVENOUS

## 2015-04-20 MED ORDER — PROPOFOL 10 MG/ML IV BOLUS
INTRAVENOUS | Status: AC
  Filled 2015-04-20: qty 20

## 2015-04-20 MED ORDER — IPRATROPIUM-ALBUTEROL 0.5-2.5 (3) MG/3ML IN SOLN: 3.0000 mL | Freq: Four times a day (QID) | RESPIRATORY_TRACT | Status: DC

## 2015-04-20 MED ORDER — OXYCODONE-ACETAMINOPHEN 5-325 MG PO TABS
1.0000 | ORAL_TABLET | ORAL | Status: DC | PRN
  Filled 2015-04-20: qty 1

## 2015-04-20 MED ORDER — ALBUTEROL (5 MG/ML) CONTINUOUS INHALATION SOLN: 10.0000 mg/h | INHALATION_SOLUTION | RESPIRATORY_TRACT | Status: DC

## 2015-04-20 MED ORDER — FENTANYL BOLUS VIA INFUSION
50.0000 ug | INTRAVENOUS | Status: DC | PRN
  Administered 2015-04-21 – 2015-04-24 (×7): 50 ug via INTRAVENOUS
  Filled 2015-04-20: qty 50

## 2015-04-20 MED ORDER — ALBUTEROL SULFATE (2.5 MG/3ML) 0.083% IN NEBU
5.0000 mg | INHALATION_SOLUTION | RESPIRATORY_TRACT | Status: DC
  Administered 2015-04-20 – 2015-04-22 (×8): 5 mg via RESPIRATORY_TRACT
  Filled 2015-04-20 (×8): qty 6

## 2015-04-20 MED ORDER — ALBUTEROL SULFATE (2.5 MG/3ML) 0.083% IN NEBU
2.5000 mg | INHALATION_SOLUTION | RESPIRATORY_TRACT | Status: DC | PRN
  Administered 2015-04-20 – 2015-04-21 (×2): 2.5 mg via RESPIRATORY_TRACT
  Filled 2015-04-20 (×3): qty 3

## 2015-04-20 NOTE — Progress Notes (Signed)
PROGRESS NOTE  Jasmine [name] [id number] DOB: 1990-04-10 DOA: 04/19/2015 PCP: Brayton Caves, PA-C Outpatient Specialists:    LOS: 1 day   Brief Narrative: 25 y.o. female with PMH asthma, history of multiple intubation due to asthma exacerbation, seizure, sickle cell trait, pancreatitis, GERD, who presented with dry cough and shortness of breath and asthma exacerbation  Assessment & Plan: Principal Problem:   Asthma exacerbation Active Problems:   History of food anaphylaxis   GERD (gastroesophageal reflux disease)   Hypokalemia   Asthma with acute exacerbation   Asthma exacerbation - patient does not appreciate significant improvement this morning, she appears uncomfortable, quite tachypneic working hard to breathe - repeat ABG, consulted PCCM to evaluate, discussed with Dr. Ashok Cordia  - No infiltration on chest x-ray.  - She has hx of multiple intubation due to asthma exacerbation, at high risk of deteriorating.  - Continue steroids, antibiotics, BiPAP  Hypokalemia  - K= 3.1 on admission, Repleted, Mg OK - Check Mg level  History of seizure disorder - continue Keppra  GERD - Pepcid   DVT prophylaxis: Lovenox Code Status: Full Family Communication: no family bedside Disposition Plan: home when ready  Barriers for discharge: dyspnea  Consultants:   PCCM  Procedures:   None   Antimicrobials:  Levaquin   Subjective: - cannot speak in full sentences, feeling short of breath - no chest pain, no palpitations  Objective: Filed Vitals:   04/20/15 0700 04/20/15 0800 04/20/15 0830 04/20/15 0934  BP: 123/55 133/58    Pulse:      Temp:  97.1 F (36.2 C)    TempSrc:  Oral    Resp: 23 24    Height:      Weight:      SpO2: 100% 100% 100% 100%    Intake/Output Summary (Last 24 hours) at 04/20/15 1154 Last data filed at 04/20/15 0900  Gross per 24 hour  Intake    930 ml  Output   1200 ml  Net   -270 ml   Filed Weights   04/20/15 0000  Weight: 80.8  kg (178 lb 2.1 oz)    Examination: Constitutional: appears in distress   Filed Vitals:   04/20/15 0700 04/20/15 0800 04/20/15 0830 04/20/15 0934  BP: 123/55 133/58    Pulse:      Temp:  97.1 F (36.2 C)    TempSrc:  Oral    Resp: 23 24    Height:      Weight:      SpO2: 100% 100% 100% 100%   Eyes: PERRL, lids and conjunctivae normal ENMT: Mucous membranes are moist. Posterior pharynx clear of any exudate or lesions.  Respiratory: does not move air well, decreased breath sounds bilaterally, no audible wheezing, no crackles. Increased respiratory effort. + accessory muscle use.  Cardiovascular: Regular rate and rhythm, no murmurs / rubs / gallops. Tachycardic. No extremity edema. 2+ pedal pulses. No carotid bruits.  Abdomen: no tenderness, no masses palpated. Musculoskeletal: no clubbing / cyanosis. Normal muscle tone.  Neurologic: Nonfocal Psychiatric: Normal judgment and insight. Alert and oriented x 3. Anxious   Data Reviewed: I have personally reviewed following labs and imaging studies  CBC:  Recent Labs Lab 04/19/15 2024  WBC 8.1  NEUTROABS 5.3  HGB 12.3  HCT 37.1  MCV 82.1  PLT [id number]   Basic Metabolic Panel:  Recent Labs Lab 04/19/15 2024 04/20/15 0314  NA 144  --   K 3.1*  --   CL 111  --  CO2 25  --   GLUCOSE 105*  --   BUN <5*  --   CREATININE 0.71  --   CALCIUM 9.3  --   MG  --  2.2   Liver Function Tests:  Recent Labs Lab 04/19/15 2024  AST 27  ALT 27  ALKPHOS 52  BILITOT 0.4  PROT 7.2  ALBUMIN 4.3   Coagulation Profile:  Recent Labs Lab 04/19/15 2330  INR 1.15   CBG:  Recent Labs Lab 04/20/15 0825 04/20/15 1142  GLUCAP 177* 158*   Urine analysis:    Component Value Date/Time   COLORURINE YELLOW 10/12/2014 0129   APPEARANCEUR CLEAR 10/12/2014 0129   LABSPEC 1.020 11/04/2014 0946   PHURINE 7.0 11/04/2014 0946   GLUCOSEU NEGATIVE 11/04/2014 0946   HGBUR NEGATIVE 11/04/2014 0946   BILIRUBINUR NEGATIVE 11/04/2014 [place] 11/04/2014 0946   PROTEINUR NEGATIVE 11/04/2014 0946   UROBILINOGEN 1.0 11/04/2014 0946   NITRITE NEGATIVE 11/04/2014 0946   LEUKOCYTESUR NEGATIVE 11/04/2014 0946   Sepsis Labs: Invalid input(s): PROCALCITONIN, LACTICIDVEN  Recent Results (from the past 240 hour(s))  MRSA PCR Screening     Status: None   Collection Time: 04/20/15 12:45 AM  Result Value Ref Range Status   MRSA by PCR NEGATIVE NEGATIVE Final    Comment:        The GeneXpert MRSA Assay (FDA approved for NASAL specimens only), is one component of a comprehensive MRSA colonization surveillance program. It is not intended to diagnose MRSA infection nor to guide or monitor treatment for MRSA infections.       Radiology Studies: Dg Chest 2 View  04/19/2015  CLINICAL DATA:  Shortness of breath. Nonproductive cough and wheezing EXAM: CHEST  2 VIEW COMPARISON:  01/02/2015 FINDINGS: Bronchitic markings most convincing on the lateral projection where there is cuffing and infrahilar tram track appearance. There is no edema, consolidation, effusion, or pneumothorax. Interstitial crowding in the frontal projection. IMPRESSION: Bronchitic changes.  Negative for pneumonia or collapse. Electronically Signed   By: Monte Fantasia M.D.   On: 04/19/2015 19:50     Scheduled Meds: . albuterol  5 mg Nebulization Q2H  . albuterol  5 mg Nebulization Q4H  . antiseptic oral rinse  7 mL Mouth Rinse BID  . enoxaparin (LOVENOX) injection  40 mg Subcutaneous QHS  . famotidine (PEPCID) IV  20 mg Intravenous Q24H  . ipratropium  0.5 mg Nebulization Q4H  . levETIRAcetam  500 mg Intravenous Q12H  . levofloxacin (LEVAQUIN) IV  500 mg Intravenous QHS  . methylPREDNISolone (SOLU-MEDROL) injection  60 mg Intravenous Q6H   Continuous Infusions: . sodium chloride 100 mL/hr at 04/20/15 0042   Time spent: 35 minutes, > 50% bedside discussing with and evaluating patient, and on the floor discussing with PCCM and RN   Marzetta Board, MD, PhD [facility] Pager [phone]  If 7PM-7AM, please contact night-coverage [email or web address] Password [email or web address] 04/20/2015, 11:54 AM

## 2015-04-20 NOTE — Progress Notes (Signed)
[facility] Progress Note Patient Name: Jasmine [name] DOB: Oct 17, 1990 MRN: [id number]   Date of Service  04/20/2015  HPI/Events of Note  Acute resp distress On bipap  eICU Interventions  Called anesthesia to intubate Vent/ sedation orders PCXR/ ABG     Intervention Category Major Interventions: Respiratory failure - evaluation and management  Desira Alessandrini V. 04/20/2015, 3:31 PM

## 2015-04-20 NOTE — Progress Notes (Signed)
PCCM Attending Note: Just informed that he will need oxygen is not available for the patient at this hospital. Continuing patient on 5 mg albuterol nebulizers every hour for the next 4 hour along with Atrovent nebulized every 4 hours. Close monitoring of patient on BiPAP. Somewhat improved work of breathing on BiPAP.  Sonia Baller Ashok Cordia, M.D. [facility] Pulmonary & Critical Care Pager:  [phone] After 3pm or if no response, call [phone] 8:30 AM 04/20/2015

## 2015-04-20 NOTE — Care Management Note (Signed)
Case Management Note  Patient Details  Name: Jasmine [name] MRN: [id number] Date of Birth: 1990/08/08  Subjective/Objective:            Asthma         Action/Plan:Date:  April 20, 2015 Chart reviewed for concurrent status and case management needs. Will continue to follow patient for changes and needs: Velva Harman, BSN, RN, [place]   [phone]   Expected Discharge Date:                  Expected Discharge Plan:  Home/Self Care  In-House Referral:  NA  Discharge planning Services  CM Consult  Post Acute Care Choice:  NA Choice offered to:  NA  DME Arranged:    DME Agency:     HH Arranged:    [place] Agency:     Status of Service:  Completed, signed off  Medicare Important Message Given:    Date Medicare IM Given:    Medicare IM give by:    Date Additional Medicare IM Given:    Additional Medicare Important Message give by:     If discussed at [place] of Stay Meetings, dates discussed:    Additional Comments:  Leeroy Cha, RN 04/20/2015, 9:43 AM

## 2015-04-20 NOTE — Anesthesia Procedure Notes (Signed)
Procedure Name: Intubation Date/Time: 04/20/2015 3:30 PM Performed by: Dione Booze Pre-anesthesia Checklist: Emergency Drugs available, Suction available, Patient being monitored and Patient identified Patient Re-evaluated:Patient Re-evaluated prior to inductionOxygen Delivery Method: Ambu bag Preoxygenation: Pre-oxygenation with 100% oxygen Intubation Type: IV induction and Cricoid Pressure applied Laryngoscope Size: Mac and 4 Grade View: Grade I Tube type: Subglottic suction tube Tube size: 7.5 mm Number of attempts: 1 Airway Equipment and Method: Stylet Placement Confirmation: ETT inserted through vocal cords under direct vision,  positive ETCO2 and breath sounds checked- equal and bilateral Secured at: 22 cm Tube secured with: Tape Dental Injury: Teeth and Oropharynx as per pre-operative assessment  Comments: Poor dentition, no change after intubation

## 2015-04-20 NOTE — Consult Note (Signed)
Name: Jasmine [name] MRN: [id number] DOB: 18-Dec-1990    ADMISSION DATE:  04/19/2015 CONSULTATION DATE:  04/20/2015  REFERRING MD : Marzetta Board, M.D. / [facility]  CHIEF COMPLAINT:  Asthma Exacerbation  BRIEF PATIENT DESCRIPTION: 25 year old female with long history of asthma with prior intubations. Presented with asthma exacerbation for approximately 3 days.  SIGNIFICANT EVENTS  4/4 - Admit  STUDIES:  CXR PA/LAT 4/4:  Personally reviewed by me. Normal lung volumes. No evidence of hyperinflation. No focal opacity or mass appreciated. No pleural effusion. Heart normal in size. Mediastinum normal in contour.  MICROBIOLOGY: Respiratory Panel PCR 4/4>>> Blood Ctx x2 4/4>>> MRSA PCR 4/5:  Negative  ANTIBIOTICS: Levaquin 4/4>>>  LINES/TUBES: PIV x1  HISTORY OF PRESENT ILLNESS:  Unable to obtain history from patient due to asthma exacerbation and increased work of breathing. Patient reports she has been having breathing problems for approximately 3 days. Known history of asthma. Has home medications of Spiriva, Advair, and Singulair but it's difficult to know whether or not she is actually taking these medicines.  PAST MEDICAL HISTORY :  Past Medical History  Diagnosis Date  . Asthma   . Sickle cell trait ([place])   . Pancreatitis   . GERD (gastroesophageal reflux disease)   . Seizures ([place])     epilepsy  . Heart murmur     last work up age 49- no symtoms    PAST SURGICAL HISTORY: Past Surgical History  Procedure Laterality Date  . Nerve, tendon and artery repair Right 09/23/2012    Procedure: I&D and Repair As Necessary/Right Hand and Palm;  Surgeon: Roseanne Kaufman, MD;  Location: [place];  Service: Orthopedics;  Laterality: Right;  . Tonsillectomy    . Hernia repair    . Cesarean section      x 1  . Hand surgery    . Appendectomy    . Cesarean section N/A 11/08/2014    Procedure: CESAREAN SECTION;  Surgeon: Truett Mainland, DO;  Location: [facility];  Service: Obstetrics;   Laterality: N/A;    Prior to Admission medications   Medication Sig Start Date End Date Taking? Authorizing Provider  albuterol (PROVENTIL HFA;VENTOLIN HFA) 108 (90 BASE) MCG/ACT inhaler Inhale 2 puffs into the lungs every 4 (four) hours as needed for wheezing or shortness of breath. 07/06/14  Yes Shanker Kristeen Mans, MD  albuterol (PROVENTIL) (2.5 MG/3ML) 0.083% nebulizer solution Take 3 mLs (2.5 mg total) by nebulization every 4 (four) hours as needed for wheezing or shortness of breath. 07/06/14  Yes Shanker Kristeen Mans, MD  tiotropium (SPIRIVA) 18 MCG inhalation capsule Place 18 mcg into inhaler and inhale daily.   Yes Historical Provider, MD  cyclobenzaprine (FLEXERIL) 10 MG tablet Reported on 04/19/2015 09/30/14   Historical Provider, MD  EPINEPHrine (ADRENALIN) 1 MG/ML injection Inject 1 mL (1 mg total) into the muscle once. 06/10/14   Nila Nephew, MD  Fluticasone-Salmeterol (ADVAIR DISKUS) 250-50 MCG/DOSE AEPB Inhale 1 puff into the lungs 2 (two) times daily. Patient not taking: Reported on 04/19/2015 07/06/14   Jonetta Osgood, MD  levETIRAcetam (KEPPRA) 500 MG tablet Take 1 tablet (500 mg total) by mouth 2 (two) times daily. 07/06/14   Shanker Kristeen Mans, MD  montelukast (SINGULAIR) 10 MG tablet Take 1 tablet (10 mg total) by mouth at bedtime. Patient not taking: Reported on 04/19/2015 07/06/14   Jonetta Osgood, MD  oxyCODONE (OXY IR/ROXICODONE) 5 MG immediate release tablet Take 1 tablet (5 mg total) by mouth every 4 (four) hours  as needed for moderate pain. Patient not taking: Reported on 04/19/2015 11/11/14   Keane Scrape, MD  Prenatal Multivit-Min-Fe-FA (PRENATAL VITAMINS) 0.8 MG tablet Take 1 tablet by mouth daily. Patient not taking: Reported on 01/03/2015 06/10/14   Nila Nephew, MD   Allergies  Allergen Reactions  . Avocado Anaphylaxis  . Cheese Shortness Of Breath    Asthma trigger  . Chocolate Shortness Of Breath    Asthma trigger  . Ibuprofen Hives and Shortness Of Breath     Tolerates naproxen  . Orange Juice [Orange Oil] Shortness Of Breath  . Other Hives    ALLERGIC TO ALL STEROIDS       EXCEPT IV SOLU MEDROL  . Peach [Prunus Persica] Anaphylaxis  . Peanuts [Peanut Oil] Anaphylaxis  . Pear Anaphylaxis  . Prednisone Hives  . Raspberry Anaphylaxis  . Tylenol [Acetaminophen] Hives and Shortness Of Breath  . Amoxicillin Hives    Has patient had a PCN reaction causing immediate rash, facial/tongue/throat swelling, SOB or lightheadedness with hypotension:Yes Has patient had a PCN reaction causing severe rash involving mucus membranes or skin necrosis:No Has patient had a PCN reaction that required hospitalization:no Has patient had a PCN reaction occurring within the last 10 years:No If all of the above answers are "NO", then may proceed with Cephalosporin use.     Marland Kitchen Doxycycline Hives  . Erythromycin Hives  . Ivp Dye [Iodinated Diagnostic Agents]     Shortness of breath   . Latex     Swelling/anaphylaxis  . Milk Of Magnesia [Magnesium Hydroxide] Hives and Itching  . Penicillins Hives    Has patient had a PCN reaction causing immediate rash, facial/tongue/throat swelling, SOB or lightheadedness with hypotension: Yes Has patient had a PCN reaction causing severe rash involving mucus membranes or skin necrosis: No Has patient had a PCN reaction that required hospitalization Yes Has patient had a PCN reaction occurring within the last 10 years: No If all of the above answers are "NO", then may proceed with Cephalosporin use.     FAMILY HISTORY:  Family History  Problem Relation Age of Onset  . Cancer Mother     cervical cancer  . Asthma Mother   . Hypertension Father   . Sickle cell anemia Father   . Asthma Sister   . Diabetes Maternal Aunt   . Cancer Maternal Grandmother    SOCIAL HISTORY: Social History  Substance Use Topics  . Smoking status: Former Smoker    Types: Cigarettes    Quit date: 03/27/2014  . Smokeless tobacco: Never Used  .  Alcohol Use: No    REVIEW OF SYSTEMS:  Unable to obtain given work of breathing.  SUBJECTIVE:   VITAL SIGNS: Temp:  [97.4 F (36.3 C)-98.5 F (36.9 C)] 97.4 F (36.3 C) (04/05 0400) Pulse Rate:  [105-106] 106 (04/04 2141) Resp:  [16-42] 42 (04/05 0500) BP: (104-160)/(39-68) 119/44 mmHg (04/05 0300) SpO2:  [99 %-100 %] 100 % (04/05 0500) Weight:  [178 lb 2.1 oz (80.8 kg)] 178 lb 2.1 oz (80.8 kg) (04/05 0000)  PHYSICAL EXAMINATION: General:  Awake. Alert. Moderate distress. No family at bedside.  Integument:  Warm & dry. No rash on exposed skin.  Lymphatics:  No appreciated cervical or supraclavicular lymphadenoapthy. HEENT:  Moist mucus membranes. No scleral injection or icterus.  Cardiovascular:  Tachycardic. Regular rhythm. No edema. No appreciable JVD.  Pulmonary:  Significantly increased work of breathing. Poor aeration in the bases with tachypnea. No wheezing appreciated. Abdomen: Soft. Normal bowel sounds.  Nondistended. Grossly nontender. Musculoskeletal:  Normal bulk and tone. Hand grip strength 5/5 bilaterally. No joint deformity or effusion appreciated. Neurological:  CN 2-12 grossly in tact. No meningismus. Moving all 4 extremities equally.  Psychiatric:  Unable to assess given work of breathing.    Recent Labs Lab 04/19/15 2024  NA 144  K 3.1*  CL 111  CO2 25  BUN <5*  CREATININE 0.71  GLUCOSE 105*    Recent Labs Lab 04/19/15 2024  HGB 12.3  HCT 37.1  WBC 8.1  PLT 214   Dg Chest 2 View  04/19/2015  CLINICAL DATA:  Shortness of breath. Nonproductive cough and wheezing EXAM: CHEST  2 VIEW COMPARISON:  01/02/2015 FINDINGS: Bronchitic markings most convincing on the lateral projection where there is cuffing and infrahilar tram track appearance. There is no edema, consolidation, effusion, or pneumothorax. Interstitial crowding in the frontal projection. IMPRESSION: Bronchitic changes.  Negative for pneumonia or collapse. Electronically Signed   By: Monte Fantasia M.D.   On: 04/19/2015 19:50    ASSESSMENT / PLAN:  25 year old female with long history of asthma and prior intubations. Presents with asthma exacerbation for approximately 3 days per patient's report. Patient is in significant amount of respiratory distress. Attempting noninvasive positive pressure ventilation to ease work of breathing and continue to administer nebulizer therapies.  1. Asthma exacerbation: Continuing Solu-Medrol but changing to 60 mg IV every 6 hours. Continuing Atrovent nebulized every 4 hours. Starting albuterol 5 mg nebulizers every hour. Ordering Heliox. Starting BiPAP for work of breathing. ABG pending. Continuing Levaquin empirically for now while awaiting respiratory viral panel PCR. Checking serum procalcitonin per algorithm. 2. H/O Epilepsy: Switching oral Keppra to IV Keppra 500 mg every 12 hours. 3. H/O GERD:  Pepcid IV qhs. 4. Diet: Nothing by mouth. 5. Prophylaxis: Lovenox subcutaneous daily, SCDs, & Pepcid IV daily at bedtime.  I have spent a total of 33 minutes of critical care time today caring for the patient and reviewing the patient's electronic medical record.  Sonia Baller Ashok Cordia, M.D. [facility] Pulmonary & Critical Care Pager:  [phone] After 3pm or if no response, call [phone] 04/20/2015, 7:30 AM

## 2015-04-20 NOTE — ED Provider Notes (Signed)
CSN: [id number]     Arrival date & time 04/19/15  1844 History   First MD Initiated Contact with Patient 04/19/15 1940     Chief Complaint  Patient presents with  . Shortness of Breath  . Cough  . Wheezing     (Consider location/radiation/quality/duration/timing/severity/associated sxs/prior Treatment) Patient is a 25 y.o. female presenting with shortness of breath, cough, and wheezing.  Shortness of Breath Severity:  Severe Onset quality:  Gradual Duration:  1 day Timing:  Constant Progression:  Worsening Chronicity:  Recurrent Associated symptoms: cough and wheezing   Cough Associated symptoms: shortness of breath and wheezing   Wheezing Associated symptoms: cough and shortness of breath     Past Medical History  Diagnosis Date  . Asthma   . Sickle cell trait ([place])   . Pancreatitis   . GERD (gastroesophageal reflux disease)   . Seizures ([place])     epilepsy  . Heart murmur     last work up age 64- no symtoms   Past Surgical History  Procedure Laterality Date  . Nerve, tendon and artery repair Right 09/23/2012    Procedure: I&D and Repair As Necessary/Right Hand and Palm;  Surgeon: Roseanne Kaufman, MD;  Location: [facility];  Service: Orthopedics;  Laterality: Right;  . Tonsillectomy    . Hernia repair    . Cesarean section      x 1  . Hand surgery    . Appendectomy    . Cesarean section N/A 11/08/2014    Procedure: CESAREAN SECTION;  Surgeon: Truett Mainland, DO;  Location: [facility];  Service: Obstetrics;  Laterality: N/A;   Family History  Problem Relation Age of Onset  . Cancer Mother     cervical cancer  . Asthma Mother   . Hypertension Father   . Sickle cell anemia Father   . Asthma Sister   . Diabetes Maternal Aunt   . Cancer Maternal Grandmother    Social History  Substance Use Topics  . Smoking status: Former Smoker    Types: Cigarettes    Quit date: 03/27/2014  . Smokeless tobacco: Never Used  . Alcohol Use: No   OB History    Gravida Para Term  Preterm AB TAB SAB Ectopic Multiple Living   3 3 3       0 3     Review of Systems  Respiratory: Positive for cough, shortness of breath and wheezing.   All other systems reviewed and are negative.     Allergies  Avocado; Cheese; Chocolate; Ibuprofen; Orange juice; Other; Peach; Peanuts; Pear; Prednisone; Raspberry; Tylenol; Amoxicillin; Doxycycline; Erythromycin; Ivp dye; Latex; Milk of magnesia; and Penicillins  Home Medications   Prior to Admission medications   Medication Sig Start Date End Date Taking? Authorizing Provider  albuterol (PROVENTIL HFA;VENTOLIN HFA) 108 (90 BASE) MCG/ACT inhaler Inhale 2 puffs into the lungs every 4 (four) hours as needed for wheezing or shortness of breath. 07/06/14  Yes Shanker Kristeen Mans, MD  albuterol (PROVENTIL) (2.5 MG/3ML) 0.083% nebulizer solution Take 3 mLs (2.5 mg total) by nebulization every 4 (four) hours as needed for wheezing or shortness of breath. 07/06/14  Yes Shanker Kristeen Mans, MD  tiotropium (SPIRIVA) 18 MCG inhalation capsule Place 18 mcg into inhaler and inhale daily.   Yes Historical Provider, MD  cyclobenzaprine (FLEXERIL) 10 MG tablet Reported on 04/19/2015 09/30/14   Historical Provider, MD  EPINEPHrine (ADRENALIN) 1 MG/ML injection Inject 1 mL (1 mg total) into the muscle once. 06/10/14   Marita Kansas  Deniece Ree, MD  Fluticasone-Salmeterol (ADVAIR DISKUS) 250-50 MCG/DOSE AEPB Inhale 1 puff into the lungs 2 (two) times daily. Patient not taking: Reported on 04/19/2015 07/06/14   Jonetta Osgood, MD  levETIRAcetam (KEPPRA) 500 MG tablet Take 1 tablet (500 mg total) by mouth 2 (two) times daily. 07/06/14   Shanker Kristeen Mans, MD  montelukast (SINGULAIR) 10 MG tablet Take 1 tablet (10 mg total) by mouth at bedtime. Patient not taking: Reported on 04/19/2015 07/06/14   Jonetta Osgood, MD  oxyCODONE (OXY IR/ROXICODONE) 5 MG immediate release tablet Take 1 tablet (5 mg total) by mouth every 4 (four) hours as needed for moderate pain. Patient not taking:  Reported on 04/19/2015 11/11/14   Keane Scrape, MD  Prenatal Multivit-Min-Fe-FA (PRENATAL VITAMINS) 0.8 MG tablet Take 1 tablet by mouth daily. Patient not taking: Reported on 01/03/2015 06/10/14   Nila Nephew, MD   BP 117/57 mmHg  Pulse 106  Temp(Src) 98.5 F (36.9 C) (Oral)  Resp 16  SpO2 100%  LMP 04/14/2015 Physical Exam  Constitutional: She appears well-developed and well-nourished.  HENT:  Head: Normocephalic and atraumatic.  Neck: Normal range of motion.  Cardiovascular: Normal rate and regular rhythm.   No murmur heard. Pulmonary/Chest: No stridor. She is in respiratory distress. She has decreased breath sounds. She has wheezes.  Abdominal: She exhibits no distension.  Neurological: She is alert.  Nursing note and vitals reviewed.   ED Course  Procedures (including critical care time)  CRITICAL CARE Performed by: Merrily Pew  Total critical care time: 35 minutes Critical care time was exclusive of separately billable procedures and treating other patients. Critical care was necessary to treat or prevent imminent or life-threatening deterioration. Critical care was time spent personally by me on the following activities: development of treatment plan with patient and/or surrogate as well as nursing, discussions with consultants, evaluation of patient's response to treatment, examination of patient, obtaining history from patient or surrogate, ordering and performing treatments and interventions, ordering and review of laboratory studies, ordering and review of radiographic studies, pulse oximetry and re-evaluation of patient's condition.   Labs Review Labs Reviewed  CBC WITH DIFFERENTIAL/PLATELET - Abnormal; Notable for the following:    RDW 16.4 (*)    All other components within normal limits  COMPREHENSIVE METABOLIC PANEL - Abnormal; Notable for the following:    Potassium 3.1 (*)    Glucose, Bld 105 (*)    BUN <5 (*)    All other components within normal  limits  RESPIRATORY VIRUS PANEL  CULTURE, BLOOD (ROUTINE X 2)  CULTURE, BLOOD (ROUTINE X 2)  CULTURE, EXPECTORATED SPUTUM-ASSESSMENT  GRAM STAIN  MRSA PCR SCREENING  HCG, QUANTITATIVE, PREGNANCY  PROTIME-INR  BLOOD GAS, ARTERIAL  MAGNESIUM  LEVETIRACETAM LEVEL  INFLUENZA PANEL BY PCR (TYPE A & B, H1N1)  HIV ANTIBODY (ROUTINE TESTING)  STREP PNEUMONIAE URINARY ANTIGEN    Imaging Review Dg Chest 2 View  04/19/2015  CLINICAL DATA:  Shortness of breath. Nonproductive cough and wheezing EXAM: CHEST  2 VIEW COMPARISON:  01/02/2015 FINDINGS: Bronchitic markings most convincing on the lateral projection where there is cuffing and infrahilar tram track appearance. There is no edema, consolidation, effusion, or pneumothorax. Interstitial crowding in the frontal projection. IMPRESSION: Bronchitic changes.  Negative for pneumonia or collapse. Electronically Signed   By: Monte Fantasia M.D.   On: 04/19/2015 19:50   I have personally reviewed and evaluated these images and lab results as part of my medical decision-making.   EKG Interpretation None  MDM   Final diagnoses:  Asthma with acute exacerbation, unspecified asthma severity  Hypokalemia    25 yo F w/ severe asthma exacerbation requiring mulitple rounds of albuterol and all adjuncts and still with pretty tight lung sounds so was admitted to stepdown for further treatment.     Merrily Pew, MD 04/20/15 [phone]

## 2015-04-20 NOTE — Progress Notes (Signed)
Unable to place results in sunquest due to machine difficulity. Results given to Dr. Blaine Hamper. ABG results are: pH 7.39, pCO2 30.5, pO2 124, and bicarb 18.4.

## 2015-04-21 ENCOUNTER — Inpatient Hospital Stay (HOSPITAL_COMMUNITY): Payer: Medicaid Other

## 2015-04-21 DIAGNOSIS — R1013 Epigastric pain: Secondary | ICD-10-CM

## 2015-04-21 DIAGNOSIS — J9602 Acute respiratory failure with hypercapnia: Secondary | ICD-10-CM

## 2015-04-21 LAB — CBC WITH DIFFERENTIAL/PLATELET
BASOS PCT: 0 %
Basophils Absolute: 0 10*3/uL (ref 0.0–0.1)
EOS ABS: 0 10*3/uL (ref 0.0–0.7)
Eosinophils Relative: 0 %
HCT: 33.8 % — ABNORMAL LOW (ref 36.0–46.0)
Hemoglobin: 11 g/dL — ABNORMAL LOW (ref 12.0–15.0)
Lymphocytes Relative: 5 %
Lymphs Abs: 1.3 10*3/uL (ref 0.7–4.0)
MCH: 27.3 pg (ref 26.0–34.0)
MCHC: 32.5 g/dL (ref 30.0–36.0)
MCV: 83.9 fL (ref 78.0–100.0)
MONO ABS: 1 10*3/uL (ref 0.1–1.0)
Monocytes Relative: 4 %
NEUTROS PCT: 91 %
Neutro Abs: 23.2 10*3/uL — ABNORMAL HIGH (ref 1.7–7.7)
PLATELETS: 226 10*3/uL (ref 150–400)
RBC: 4.03 MIL/uL (ref 3.87–5.11)
RDW: 17.5 % — AB (ref 11.5–15.5)
WBC: 25.5 10*3/uL — AB (ref 4.0–10.5)

## 2015-04-21 LAB — HEPATIC FUNCTION PANEL
ALK PHOS: 42 U/L (ref 38–126)
ALT: 21 U/L (ref 14–54)
AST: 21 U/L (ref 15–41)
Albumin: 3.6 g/dL (ref 3.5–5.0)
Bilirubin, Direct: <0.1 mg/dL — ABNORMAL LOW (ref 0.1–0.5)
TOTAL PROTEIN: 6.3 g/dL — AB (ref 6.5–8.1)
Total Bilirubin: 0.4 mg/dL (ref 0.3–1.2)

## 2015-04-21 LAB — GLUCOSE, CAPILLARY
GLUCOSE-CAPILLARY: 100 mg/dL — AB (ref 65–99)
GLUCOSE-CAPILLARY: 104 mg/dL — AB (ref 65–99)
GLUCOSE-CAPILLARY: 113 mg/dL — AB (ref 65–99)
GLUCOSE-CAPILLARY: 98 mg/dL (ref 65–99)
Glucose-Capillary: 116 mg/dL — ABNORMAL HIGH (ref 65–99)
Glucose-Capillary: 109 mg/dL — ABNORMAL HIGH (ref 65–99)

## 2015-04-21 LAB — RESPIRATORY VIRUS PANEL
Adenovirus: NEGATIVE
Influenza A: NEGATIVE
Influenza B: NEGATIVE
METAPNEUMOVIRUS: POSITIVE — AB
PARAINFLUENZA 1 A: NEGATIVE
PARAINFLUENZA 2 A: NEGATIVE
Parainfluenza 3: NEGATIVE
RESPIRATORY SYNCYTIAL VIRUS B: NEGATIVE
RHINOVIRUS: NEGATIVE
Respiratory Syncytial Virus A: NEGATIVE

## 2015-04-21 LAB — RENAL FUNCTION PANEL
ALBUMIN: 3.6 g/dL (ref 3.5–5.0)
ANION GAP: 10 (ref 5–15)
BUN: 6 mg/dL (ref 6–20)
CALCIUM: 8.9 mg/dL (ref 8.9–10.3)
CO2: 19 mmol/L — ABNORMAL LOW (ref 22–32)
Chloride: 114 mmol/L — ABNORMAL HIGH (ref 101–111)
Creatinine, Ser: 0.58 mg/dL (ref 0.44–1.00)
GFR calc non Af Amer: >60 mL/min (ref >60)
GLUCOSE: 117 mg/dL — AB (ref 65–99)
PHOSPHORUS: 3 mg/dL (ref 2.5–4.6)
Potassium: 4.6 mmol/L (ref 3.5–5.1)
SODIUM: 143 mmol/L (ref 135–145)

## 2015-04-21 LAB — EXPECTORATED SPUTUM ASSESSMENT W REFEX TO RESP CULTURE

## 2015-04-21 LAB — BASIC METABOLIC PANEL
ANION GAP: 8 (ref 5–15)
BUN: 6 mg/dL (ref 6–20)
CALCIUM: 8.8 mg/dL — AB (ref 8.9–10.3)
CO2: 19 mmol/L — ABNORMAL LOW (ref 22–32)
CREATININE: 0.66 mg/dL (ref 0.44–1.00)
Chloride: 114 mmol/L — ABNORMAL HIGH (ref 101–111)
GFR calc Af Amer: >60 mL/min (ref >60)
GFR calc non Af Amer: >60 mL/min (ref >60)
GLUCOSE: 114 mg/dL — AB (ref 65–99)
Potassium: 4.5 mmol/L (ref 3.5–5.1)
Sodium: 141 mmol/L (ref 135–145)

## 2015-04-21 LAB — MAGNESIUM: Magnesium: 2.2 mg/dL (ref 1.7–2.4)

## 2015-04-21 LAB — LIPASE, BLOOD: Lipase: 21 U/L (ref 11–51)

## 2015-04-21 LAB — EXPECTORATED SPUTUM ASSESSMENT W GRAM STAIN, RFLX TO RESP C

## 2015-04-21 LAB — AMYLASE: Amylase: 50 U/L (ref 28–100)

## 2015-04-21 LAB — PROCALCITONIN

## 2015-04-21 MED ORDER — MIDAZOLAM HCL 2 MG/2ML IJ SOLN
1.0000 mg | INTRAMUSCULAR | Status: DC | PRN
  Administered 2015-04-21 – 2015-04-23 (×12): 2 mg via INTRAVENOUS
  Administered 2015-04-23: 1 mg via INTRAVENOUS
  Administered 2015-04-23 – 2015-04-24 (×5): 2 mg via INTRAVENOUS
  Filled 2015-04-21 (×20): qty 2

## 2015-04-21 MED ORDER — MIDAZOLAM HCL 2 MG/2ML IJ SOLN
INTRAMUSCULAR | Status: AC
  Administered 2015-04-21: 2 mg
  Filled 2015-04-21: qty 2

## 2015-04-21 MED ORDER — LACTATED RINGERS IV BOLUS (SEPSIS)
1000.0000 mL | Freq: Once | INTRAVENOUS | Status: AC
  Administered 2015-04-21: 1000 mL via INTRAVENOUS

## 2015-04-21 NOTE — Progress Notes (Signed)
Initial Nutrition Assessment  DOCUMENTATION CODES:   Not applicable  INTERVENTION:  -RD continue to monitor for needs -If unable to extubate begin Vital High Protein via OGT @ 50mL/hr -Pro-Stat 47mL x5, each supplement provides 100 calories and 15g protein -TF Regimen combined with propofol infusion @ 68mL/hr provides 1188 calories, 96g protein, 200cc free water  NUTRITION DIAGNOSIS:   Inadequate oral intake related to inability to eat as evidenced by NPO status.  GOAL:   Patient will meet greater than or equal to 90% of their needs  MONITOR:   Vent status, Labs, I & O's  REASON FOR ASSESSMENT:   Ventilator    ASSESSMENT:   Jasmine [name] is a 25 y.o. female with PMH asthma, history of multiple intubation due to asthma exacerbation, seizure, sickle cell trait, pancreatitis, GERD, who presents with dry cough and shortness of breath.  Patient is currently intubated on ventilator support MV: 6.8 L/min Temp (24hrs), Avg:98.1 F (36.7 C), Min:97.2 F (36.2 C), Max:98.8 F (37.1 C)  Propofol: 17 ml/hr  Patient intubated, sedated at bedside w/ mittens on, but was alert enough to shake/nod head. Patient endorses poor appetite and weight loss.  Per chart pt's weight stable x1 year. No family available. She appears well nourished.  Pt has long hx of asthma and prior intubations -> was intubated for worsening respiratory status. Also complains of abdominal pain. Currently undergoing SBTs.   This is her 3rd day going without nutrition, if it is perceived she will not be extubated today, should begin nutrition support if medically feasible (abdominal pain).  Labs and Medications reviewed.  Diet Order:  Diet NPO time specified  Skin:  Reviewed, no issues  Last BM:  PTA  Height:   Ht Readings from Last 1 Encounters:  04/20/15 5\' 5"  (1.651 m)    Weight:   Wt Readings from Last 1 Encounters:  04/20/15 178 lb 2.1 oz (80.8 kg)    Ideal Body Weight:  56.81  kg  BMI:  Body mass index is 29.64 kg/(m^2).  Estimated Nutritional Needs:   Kcal:  [id number]  Protein:  95-120 grams  Fluid:  >/= 1.2L  EDUCATION NEEDS:   No education needs identified at this time  Satira Anis. Jasmine Janoski, MS, RD LDN After Hours/Weekend Pager [phone]

## 2015-04-21 NOTE — Progress Notes (Signed)
[facility] Progress Note Patient Name: Jasmine [name] DOB: 04-06-1990 MRN: [id number]   Date of Service  04/21/2015  HPI/Events of Note  Agitation - patient is already on a Fentanyl IV infusion at 300 mcg/hour.  eICU Interventions  Will order: 1. Versed 1-2 mg IV Q 1 hour PRN.      Intervention Category Minor Interventions: Agitation / anxiety - evaluation and management  Lysle Dingwall 04/21/2015, 6:48 PM

## 2015-04-21 NOTE — Progress Notes (Signed)
Pt transported to CT and Back with vent and 100% oxygen.

## 2015-04-21 NOTE — Progress Notes (Addendum)
Name: Jasmine [name] MRN: [id number] DOB: 1990-06-21    ADMISSION DATE:  04/19/2015 CONSULTATION DATE:  04/20/2015  REFERRING MD : Marzetta Board, M.D. / [facility]  CHIEF COMPLAINT:  Asthma Exacerbation  BRIEF PATIENT DESCRIPTION: 25 year old female with long history of asthma with prior intubations. Presented with asthma exacerbation for approximately 3 days.  SIGNIFICANT EVENTS  4/4 - Admit  STUDIES:  CXR PA/LAT 4/4:  Previously reviewed by me. Normal lung volumes. No evidence of hyperinflation. No focal opacity or mass appreciated. No pleural effusion. Heart normal in size. Mediastinum normal in contour. PORT CXR 4/6:  Personally reviewed by me. No focal opacity or effusion. Orogastric tube below diaphragm and appears to be in the stomach. Endotracheal tube in good position.  MICROBIOLOGY: Tracheal Asp Ctx 4/6>>> Respiratory Viral Panel PCR Trach Asp 4/6>>> Respiratory Panel PCR NP Swab 4/5>>> Influenza PCR 4/5:  Negative  Blood Ctx x2 4/4>>> MRSA PCR 4/5:  Negative HIV 4/4:  Negative   ANTIBIOTICS: Levaquin 4/4>>>  LINES/TUBES: OETT 7.5 4/5>>> OGT 4/5>>> FOLE 4/5>>> PIV x2  SUBJECTIVE: Patient intubated yesterday due to increased work of breathing and stridor per bedside nurse. Patient also at that time was complaining of left-sided abdominal and flank pain. Patient now signifying epigastric abdominal pain.  REVIEW OF SYSTEMS:  Unable to obtain given intubation & sedation.  VITAL SIGNS: Temp:  [97.2 F (36.2 C)-98.8 F (37.1 C)] 98.2 F (36.8 C) (04/06 0800) Pulse Rate:  [76-96] 76 (04/06 0416) Resp:  [14-33] 14 (04/06 0600) BP: (90-187)/(30-126) 101/37 mmHg (04/06 0600) SpO2:  [100 %] 100 % (04/06 0845) FiO2 (%):  [30 %-100 %] 40 % (04/06 0845)  PHYSICAL EXAMINATION: General:  Sedated. No distress. No family at bedside.  Integument:  Warm & dry. No rash on exposed skin.  Lymphatics:  No appreciated cervical or supraclavicular lymphadenoapthy. HEENT:   Endotracheal tube in place. No scleral injection or icterus.  Cardiovascular:  Regular rhythm. No edema. No appreciable JVD.  Pulmonary:  Symmetric chest wall rise on ventilator. Overall clear to auscultation bilaterally. Abdomen: Soft. Normal bowel sounds. Nondistended. Tender to palpation in epigastrium. Neurological:  Following commands. Nods to questions. Moving all 4 extremities equally.  .    Recent Labs Lab 04/19/15 2024 04/21/15 0308  NA 144 141  143  K 3.1* 4.5  4.6  CL 111 114*  114*  CO2 25 19*  19*  BUN <5* 6  6  CREATININE 0.71 0.66  0.58  GLUCOSE 105* 114*  117*    Recent Labs Lab 04/19/15 2024 04/21/15 0308  HGB 12.3 11.0*  HCT 37.1 33.8*  WBC 8.1 25.5*  PLT 214 226   Dg Chest 2 View  04/19/2015  CLINICAL DATA:  Shortness of breath. Nonproductive cough and wheezing EXAM: CHEST  2 VIEW COMPARISON:  01/02/2015 FINDINGS: Bronchitic markings most convincing on the lateral projection where there is cuffing and infrahilar tram track appearance. There is no edema, consolidation, effusion, or pneumothorax. Interstitial crowding in the frontal projection. IMPRESSION: Bronchitic changes.  Negative for pneumonia or collapse. Electronically Signed   By: Monte Fantasia M.D.   On: 04/19/2015 19:50   Portable Chest Xray  04/21/2015  CLINICAL DATA:  Intubation. EXAM: PORTABLE CHEST 1 VIEW COMPARISON:  04/20/2015. FINDINGS: Endotracheal tube and NG tube in stable position. Heart size normal. Low lung volumes with minimal bibasilar subsegmental atelectasis. No pleural effusion or pneumothorax. IMPRESSION: 1. Lines and tubes in stable position. 2. Low lung volumes with minimal bibasilar subsegmental atelectasis . Electronically  Signed   ByMarcello Moores  Register   On: 04/21/2015 07:11   Portable Chest Xray  04/20/2015  CLINICAL DATA:  Respiratory failure and status post intubation. EXAM: PORTABLE CHEST 1 VIEW COMPARISON:  04/19/2015 FINDINGS: Endotracheal tube present with the tip  approximately 1 cm above the carina. Nasogastric tube extends below the diaphragm with side port at the GE junction. Lung volumes are low with bibasilar atelectasis/ infiltrates present. No edema, pneumothorax or pleural effusion. IMPRESSION: Endotracheal tube tip is approximately 1 cm above the carina. Lungs show low volumes with bibasilar atelectasis/infiltrates. Electronically Signed   By: Aletta Edouard M.D.   On: 04/20/2015 16:16   Dg Abd Portable 1v  04/20/2015  CLINICAL DATA:  Orogastric tube placement EXAM: PORTABLE ABDOMEN - 1 VIEW COMPARISON:  None. FINDINGS: Orogastric tube tip is in the stomach. The side port is not seen. The visualized bowel gas pattern is unremarkable. IMPRESSION: Orogastric tube tip in stomach. Side-port not seen. On this current examination, side-port placement below the diaphragm cannot be confirmed. Bowel gas pattern unremarkable. Electronically Signed   By: Lowella Grip III M.D.   On: 04/20/2015 16:16    ASSESSMENT / PLAN:  25 year old female with long history of asthma and prior intubations. Yesterday for worsening respiratory status. Abdominal pain of unclear significance. Needs evaluated further with serum testing and imaging.  1. Acute hypercarbic respiratory failure: Status post intubation for/5. Continuing full ventilator support for now. Spontaneous breathing trial in the morning. 2. Asthma exacerbation: Continuing Solu-Medrol but changing to 60 mg IV every 6 hours. Continuing Atrovent & Albuterol nebulized every 4 hours. Continuing empiric Levaquin. 3. Abdominal Pain: Repeating serum LFTs, lipase, & amylase. Checking CT scan of abdomen/pelvis w/o contrast. 4. Sedation on Ventilator:  Goal RASS 0 to -1. Continuing propofol drip & fentanyl drip. 5. H/O Epilepsy: Keppra 500 mg every 12 hours. 6. H/O GERD:  Pepcid IV qhs. 7. Diet: Nothing by mouth. 8. Prophylaxis: Lovenox subcutaneous daily, SCDs, & Pepcid IV daily at bedtime.  I have spent a total of 31  minutes of critical care time today caring for the patient and reviewing the patient's electronic medical record.  Sonia Baller Ashok Cordia, M.D. [facility] Pulmonary & Critical Care Pager:  [phone] After 3pm or if no response, call [phone] 04/21/2015, 9:45 AM

## 2015-04-22 LAB — TRIGLYCERIDES: Triglycerides: 213 mg/dL — ABNORMAL HIGH (ref <150)

## 2015-04-22 LAB — CBC WITH DIFFERENTIAL/PLATELET
BASOS PCT: 0 %
Basophils Absolute: 0 10*3/uL (ref 0.0–0.1)
EOS PCT: 0 %
Eosinophils Absolute: 0 10*3/uL (ref 0.0–0.7)
HCT: 32.4 % — ABNORMAL LOW (ref 36.0–46.0)
Hemoglobin: 10.5 g/dL — ABNORMAL LOW (ref 12.0–15.0)
LYMPHS ABS: 1.2 10*3/uL (ref 0.7–4.0)
Lymphocytes Relative: 5 %
MCH: 27.4 pg (ref 26.0–34.0)
MCHC: 32.4 g/dL (ref 30.0–36.0)
MCV: 84.6 fL (ref 78.0–100.0)
Monocytes Absolute: 0.7 10*3/uL (ref 0.1–1.0)
Monocytes Relative: 3 %
Neutro Abs: 22.6 10*3/uL — ABNORMAL HIGH (ref 1.7–7.7)
Neutrophils Relative %: 92 %
Platelets: 252 10*3/uL (ref 150–400)
RBC: 3.83 MIL/uL — ABNORMAL LOW (ref 3.87–5.11)
RDW: 18 % — AB (ref 11.5–15.5)
WBC: 24.5 10*3/uL — ABNORMAL HIGH (ref 4.0–10.5)

## 2015-04-22 LAB — GLUCOSE, CAPILLARY
Glucose-Capillary: 104 mg/dL — ABNORMAL HIGH (ref 65–99)
Glucose-Capillary: 108 mg/dL — ABNORMAL HIGH (ref 65–99)
Glucose-Capillary: 98 mg/dL (ref 65–99)
Glucose-Capillary: 119 mg/dL — ABNORMAL HIGH (ref 65–99)
Glucose-Capillary: 111 mg/dL — ABNORMAL HIGH (ref 65–99)
Glucose-Capillary: 108 mg/dL — ABNORMAL HIGH (ref 65–99)

## 2015-04-22 LAB — RENAL FUNCTION PANEL
Albumin: 3.4 g/dL — ABNORMAL LOW (ref 3.5–5.0)
Anion gap: 9 (ref 5–15)
BUN: 8 mg/dL (ref 6–20)
CALCIUM: 8.7 mg/dL — AB (ref 8.9–10.3)
CO2: 23 mmol/L (ref 22–32)
CREATININE: 0.56 mg/dL (ref 0.44–1.00)
Chloride: 113 mmol/L — ABNORMAL HIGH (ref 101–111)
GFR calc Af Amer: >60 mL/min (ref >60)
GFR calc non Af Amer: >60 mL/min (ref >60)
GLUCOSE: 121 mg/dL — AB (ref 65–99)
Phosphorus: 2.5 mg/dL (ref 2.5–4.6)
Potassium: 3.9 mmol/L (ref 3.5–5.1)
SODIUM: 145 mmol/L (ref 135–145)

## 2015-04-22 LAB — MAGNESIUM: Magnesium: 2.4 mg/dL (ref 1.7–2.4)

## 2015-04-22 LAB — LEVETIRACETAM LEVEL: LEVETIRACETAM: 1.5 ug/mL — AB (ref 10.0–40.0)

## 2015-04-22 LAB — PROCALCITONIN: Procalcitonin: <0.1 ng/mL

## 2015-04-22 MED ORDER — ALBUTEROL SULFATE (2.5 MG/3ML) 0.083% IN NEBU
2.5000 mg | INHALATION_SOLUTION | RESPIRATORY_TRACT | Status: DC
  Administered 2015-04-22: 2.5 mg via RESPIRATORY_TRACT
  Filled 2015-04-22: qty 3

## 2015-04-22 MED ORDER — PRO-STAT SUGAR FREE PO LIQD
30.0000 mL | Freq: Every day | ORAL | Status: DC
  Administered 2015-04-22 – 2015-04-26 (×18): 30 mL via ORAL
  Filled 2015-04-22 (×18): qty 30

## 2015-04-22 MED ORDER — VITAL HIGH PROTEIN PO LIQD
1000.0000 mL | ORAL | Status: DC
  Filled 2015-04-22: qty 1000

## 2015-04-22 MED ORDER — PREDNISONE 5 MG/5ML PO SOLN
60.0000 mg | Freq: Every day | ORAL | Status: DC
  Administered 2015-04-23: 60 mg
  Filled 2015-04-22 (×2): qty 60

## 2015-04-22 MED ORDER — VITAL HIGH PROTEIN PO LIQD
1000.0000 mL | ORAL | Status: DC
  Administered 2015-04-22 – 2015-04-25 (×4): 1000 mL
  Filled 2015-04-22 (×5): qty 1000

## 2015-04-22 MED ORDER — IPRATROPIUM-ALBUTEROL 0.5-2.5 (3) MG/3ML IN SOLN
3.0000 mL | RESPIRATORY_TRACT | Status: DC
  Administered 2015-04-22 – 2015-04-27 (×33): 3 mL via RESPIRATORY_TRACT
  Filled 2015-04-22 (×35): qty 3

## 2015-04-22 MED ORDER — DEXMEDETOMIDINE HCL IN NACL 200 MCG/50ML IV SOLN
0.2000 ug/kg/h | INTRAVENOUS | Status: AC
  Administered 2015-04-22: 0.7 ug/kg/h via INTRAVENOUS
  Administered 2015-04-22: 0.2 ug/kg/h via INTRAVENOUS
  Administered 2015-04-22: 0.7 ug/kg/h via INTRAVENOUS
  Administered 2015-04-22: 0.6 ug/kg/h via INTRAVENOUS
  Administered 2015-04-22 – 2015-04-23 (×4): 0.7 ug/kg/h via INTRAVENOUS
  Administered 2015-04-23: 0.698 ug/kg/h via INTRAVENOUS
  Filled 2015-04-22 (×10): qty 50

## 2015-04-22 NOTE — Progress Notes (Signed)
Name: Jasmine [name] MRN: [id number] DOB: November 18, 1990    ADMISSION DATE:  04/19/2015 CONSULTATION DATE:  04/20/2015  REFERRING MD : Marzetta Board, M.D. / [facility]  CHIEF COMPLAINT:  Asthma Exacerbation  BRIEF PATIENT DESCRIPTION: 25 year old female with long history of asthma with prior intubations. Presented with asthma exacerbation for approximately 3 days.  SIGNIFICANT EVENTS  4/4 - Admit  STUDIES:  CXR PA/LAT 4/4:  Previously reviewed by me. Normal lung volumes. No evidence of hyperinflation. No focal opacity or mass appreciated. No pleural effusion. Heart normal in size. Mediastinum normal in contour. PORT CXR 4/6:  Personally reviewed by me. No focal opacity or effusion. Orogastric tube below diaphragm and appears to be in the stomach. Endotracheal tube in good position. CT ABD/PELVIS W/O 4/6: No intra-abdominal process in abdomen or pelvis. Cholelithiasis noted. Bartholin's gland cyst suspected on the left measuring 10 mm.  MICROBIOLOGY: Tracheal Asp Ctx 4/6>>> Respiratory Viral Panel PCR Trach Asp 4/6>>> Respiratory Panel PCR NP Swab 4/5:  Metapneumovirus Influenza PCR 4/5:  Negative  Blood Ctx x2 4/4>>> MRSA PCR 4/5:  Negative HIV 4/4:  Negative   ANTIBIOTICS: Levaquin 4/4>>>  LINES/TUBES: OETT 7.5 4/5>>> OGT 4/5>>> FOLE 4/5>>> PIV x2  SUBJECTIVE: Patient more agitated overnight which improved with IV benzodiazepine. Patient now sedated.  REVIEW OF SYSTEMS:  Unable to obtain given intubation & sedation.  VITAL SIGNS: Temp:  [98.4 F (36.9 C)-98.9 F (37.2 C)] 98.5 F (36.9 C) (04/07 0736) Pulse Rate:  [73-88] 88 (04/07 0807) Resp:  [14-26] 14 (04/07 0807) BP: (96-134)/(38-84) 123/67 mmHg (04/07 0807) SpO2:  [43 %-100 %] 100 % (04/07 0807) FiO2 (%):  [30 %-40 %] 30 % (04/07 0807)  PHYSICAL EXAMINATION: General:  Sedated. No distress. No family at bedside.  Integument:  Warm & dry. No rash on . Lymphatics:  No appreciated cervical or supraclavicular  lymphadenoapthy. HEENT:  Endotracheal tube in place. No scleral injection.  Cardiovascular:  Regular rhythm. No edema. No appreciable JVD.  Pulmonary:  Symmetric chest wall rise on ventilator. Clear to auscultation without wheezing. Abdomen: Soft. Nondistended. Normal bowel sounds. Neurological:  Sedated. Pupils symmetric.  Marland Kitchen    Recent Labs Lab 04/19/15 2024 04/21/15 0308 04/22/15 0311  NA 144 141  143 145  K 3.1* 4.5  4.6 3.9  CL 111 114*  114* 113*  CO2 25 19*  19* 23  BUN <5* 6  6 8   CREATININE 0.71 0.66  0.58 0.56  GLUCOSE 105* 114*  117* 121*    Recent Labs Lab 04/19/15 2024 04/21/15 0308 04/22/15 0311  HGB 12.3 11.0* 10.5*  HCT 37.1 33.8* 32.4*  WBC 8.1 25.5* 24.5*  PLT 214 226 252   Ct Abdomen Pelvis Wo Contrast  04/21/2015  CLINICAL DATA:  Abdominal pain. Multiple intubation secondary to asthma exacerbations. Sickle cell trait. Pancreatitis. Gastroesophageal reflux disease. EXAM: CT ABDOMEN AND PELVIS WITHOUT CONTRAST TECHNIQUE: Multidetector CT imaging of the abdomen and pelvis was performed following the standard protocol without IV contrast. COMPARISON:  None. FINDINGS: Lower chest: Dependent bibasilar atelectasis. Trace bilateral pleural effusions. Hepatobiliary: Normal noncontrast appearance the liver. Stone filled gallbladder, without surrounding inflammation or biliary duct dilatation. Mild motion and EKG lead artifact degradation. Pancreas: Normal, without mass or ductal dilatation. Spleen: Normal in size, without focal abnormality. Adrenals/Urinary Tract: Normal adrenal glands. No renal calculi or hydronephrosis. Urinary bladder decompressed around a Foley catheter. Stomach/Bowel: Nasogastric tube terminates at the gastric body. Otherwise normal stomach. Normal colon and terminal ileum. Probable appendectomy. Normal small bowel. Vascular/Lymphatic:  Normal caliber of the aorta and branch vessels. No abdominopelvic adenopathy. Reproductive: Normal uterus and  adnexa. Left perineal low-density lesion measures 10 mm on image 91/series 2. Other: No significant free fluid. Musculoskeletal: No acute osseous abnormality. IMPRESSION: 1.  No acute process in the abdomen or pelvis. 2. Cholelithiasis. 3. Decreased sensitivity and specificity exam due to technique related factors, as described above. 4. Suspicion of a left Bartholin's gland cyst of 10 mm. Consider physical exam correlation. Electronically Signed   By: Abigail Miyamoto M.D.   On: 04/21/2015 17:52   Portable Chest Xray  04/21/2015  CLINICAL DATA:  Intubation. EXAM: PORTABLE CHEST 1 VIEW COMPARISON:  04/20/2015. FINDINGS: Endotracheal tube and NG tube in stable position. Heart size normal. Low lung volumes with minimal bibasilar subsegmental atelectasis. No pleural effusion or pneumothorax. IMPRESSION: 1. Lines and tubes in stable position. 2. Low lung volumes with minimal bibasilar subsegmental atelectasis . Electronically Signed   By: Marcello Moores  Register   On: 04/21/2015 07:11   Portable Chest Xray  04/20/2015  CLINICAL DATA:  Respiratory failure and status post intubation. EXAM: PORTABLE CHEST 1 VIEW COMPARISON:  04/19/2015 FINDINGS: Endotracheal tube present with the tip approximately 1 cm above the carina. Nasogastric tube extends below the diaphragm with side port at the GE junction. Lung volumes are low with bibasilar atelectasis/ infiltrates present. No edema, pneumothorax or pleural effusion. IMPRESSION: Endotracheal tube tip is approximately 1 cm above the carina. Lungs show low volumes with bibasilar atelectasis/infiltrates. Electronically Signed   By: Aletta Edouard M.D.   On: 04/20/2015 16:16   Dg Abd Portable 1v  04/20/2015  CLINICAL DATA:  Orogastric tube placement EXAM: PORTABLE ABDOMEN - 1 VIEW COMPARISON:  None. FINDINGS: Orogastric tube tip is in the stomach. The side port is not seen. The visualized bowel gas pattern is unremarkable. IMPRESSION: Orogastric tube tip in stomach. Side-port not seen.  On this current examination, side-port placement below the diaphragm cannot be confirmed. Bowel gas pattern unremarkable. Electronically Signed   By: Lowella Grip III M.D.   On: 04/20/2015 16:16    ASSESSMENT / PLAN:  25 year old female with long history of asthma and prior intubations. Yesterday for worsening respiratory status. Abdominal pain of unclear significance without any obvious abnormality on CT imaging or lab testing. Suspect element of vocal cord dysfunction given severe agitation and anxiety in this patient.  1. Acute hypercarbic respiratory failure: Status post intubation 4/5. Continuing full ventilator support for now. Spontaneous breathing trial in the morning as long as anxiety is well-controlled. 2. Asthma exacerbation: Starting prednisone 60 mg via tube tomorrow. Continuing DuoNeb every 4 hours.  3. CAP: Viral panel positive for virus. Awaiting tracheal aspirate culture. Continuing empiric Levaquin. 4. Abdominal Pain: Unclear etiology. Continue to monitor symptoms with lightening of sedation. 5. Sedation on Ventilator:  Goal RASS 0 to -1. Continuing fentanyl drip. Continuing IV Versed when necessary. Transitioning from propofol to Precedex infusion.  6. H/O Epilepsy: Keppra 500 mg every 12 hours. 7. H/O GERD:  Pepcid IV qhs. 8. Diet: Starting tube feedings.  9. Prophylaxis: Lovenox subcutaneous daily, SCDs, & Pepcid IV daily at bedtime.  I have spent a total of 33 minutes of critical care time today caring for the patient and reviewing the patient's electronic medical record.  Sonia Baller Ashok Cordia, M.D. [facility] Pulmonary & Critical Care Pager:  [phone] After 3pm or if no response, call [phone] 04/22/2015, 10:40 AM

## 2015-04-22 NOTE — Progress Notes (Signed)
PHARMACY NOTE -  Levofloxacin  Pharmacy has been assisting with dosing of Levofloxacin for asthma exacerbation. Dosage remains stable at 500mg  IV q24h and need for further dosage adjustment appears unlikely at present.    Will sign off at this time.  Please reconsult if a change in clinical status warrants re-evaluation of dosage.   Lindell Spar, PharmD, BCPS Pager: [phone] 04/22/2015 11:20 AM

## 2015-04-22 NOTE — Progress Notes (Signed)
Nutrition Brief Note  RD consulted for Enteral Tubefeeding MGMT Pt has demonstrated little change from note written yesterday. Will begin tubefeeding via recommendations from yesterday as listed below:  -Begin Vital High Protein via OGT @ 53mL/hr -Pro-Stat 82mL x5, each supplement provides 100 calories and 15g protein -TF Regimen combined with propofol infusion @ 74mL/hr provides 1240 calories (110% of needs), 96g protein, 200cc free water  RD will continue to monitor and adjust as propofol rate is decreased. Please consult RD for any other nutrition concerns.  Jasmine [name]. Jasmine Septer, MS, RD LDN After Hours/Weekend Pager [phone]

## 2015-04-23 DIAGNOSIS — F411 Generalized anxiety disorder: Secondary | ICD-10-CM

## 2015-04-23 DIAGNOSIS — E876 Hypokalemia: Secondary | ICD-10-CM

## 2015-04-23 LAB — CBC WITH DIFFERENTIAL/PLATELET
BASOS ABS: 0 10*3/uL (ref 0.0–0.1)
BASOS PCT: 0 %
EOS ABS: 0 10*3/uL (ref 0.0–0.7)
EOS PCT: 0 %
HEMATOCRIT: 33.7 % — AB (ref 36.0–46.0)
Hemoglobin: 10.9 g/dL — ABNORMAL LOW (ref 12.0–15.0)
Lymphocytes Relative: 19 %
Lymphs Abs: 3.4 10*3/uL (ref 0.7–4.0)
MCH: 27.2 pg (ref 26.0–34.0)
MCHC: 32.3 g/dL (ref 30.0–36.0)
MCV: 84 fL (ref 78.0–100.0)
MONO ABS: 1.1 10*3/uL — AB (ref 0.1–1.0)
Monocytes Relative: 6 %
NEUTROS PCT: 75 %
Neutro Abs: 13.5 10*3/uL — ABNORMAL HIGH (ref 1.7–7.7)
Platelets: 215 10*3/uL (ref 150–400)
RBC: 4.01 MIL/uL (ref 3.87–5.11)
RDW: 17.6 % — AB (ref 11.5–15.5)
WBC: 18 10*3/uL — ABNORMAL HIGH (ref 4.0–10.5)

## 2015-04-23 LAB — RENAL FUNCTION PANEL
ANION GAP: 7 (ref 5–15)
Albumin: 2.9 g/dL — ABNORMAL LOW (ref 3.5–5.0)
BUN: 12 mg/dL (ref 6–20)
CALCIUM: 8.1 mg/dL — AB (ref 8.9–10.3)
CO2: 23 mmol/L (ref 22–32)
Chloride: 111 mmol/L (ref 101–111)
Creatinine, Ser: 0.57 mg/dL (ref 0.44–1.00)
GFR calc Af Amer: >60 mL/min (ref >60)
GFR calc non Af Amer: >60 mL/min (ref >60)
GLUCOSE: 131 mg/dL — AB (ref 65–99)
Phosphorus: 2.4 mg/dL — ABNORMAL LOW (ref 2.5–4.6)
Potassium: 3.2 mmol/L — ABNORMAL LOW (ref 3.5–5.1)
SODIUM: 141 mmol/L (ref 135–145)

## 2015-04-23 LAB — RESPIRATORY VIRUS PANEL
Adenovirus: NEGATIVE
INFLUENZA A: NEGATIVE
INFLUENZA B 1: NEGATIVE
METAPNEUMOVIRUS: POSITIVE — AB
PARAINFLUENZA 1 A: NEGATIVE
Parainfluenza 2: NEGATIVE
Parainfluenza 3: NEGATIVE
RESPIRATORY SYNCYTIAL VIRUS A: NEGATIVE
Respiratory Syncytial Virus B: NEGATIVE
Rhinovirus: NEGATIVE

## 2015-04-23 LAB — GLUCOSE, CAPILLARY
GLUCOSE-CAPILLARY: 103 mg/dL — AB (ref 65–99)
GLUCOSE-CAPILLARY: 110 mg/dL — AB (ref 65–99)
GLUCOSE-CAPILLARY: 128 mg/dL — AB (ref 65–99)
GLUCOSE-CAPILLARY: 95 mg/dL (ref 65–99)
Glucose-Capillary: 145 mg/dL — ABNORMAL HIGH (ref 65–99)
Glucose-Capillary: 120 mg/dL — ABNORMAL HIGH (ref 65–99)

## 2015-04-23 LAB — CULTURE, RESPIRATORY W GRAM STAIN: Culture: NORMAL

## 2015-04-23 LAB — CULTURE, RESPIRATORY

## 2015-04-23 LAB — MAGNESIUM: Magnesium: 1.9 mg/dL (ref 1.7–2.4)

## 2015-04-23 MED ORDER — HYDRALAZINE HCL 20 MG/ML IJ SOLN
10.0000 mg | INTRAMUSCULAR | Status: DC | PRN
  Administered 2015-04-23: 10 mg via INTRAVENOUS
  Filled 2015-04-23 (×2): qty 1

## 2015-04-23 MED ORDER — DEXMEDETOMIDINE HCL IN NACL 200 MCG/50ML IV SOLN
0.2000 ug/kg/h | INTRAVENOUS | Status: DC
  Administered 2015-04-23 (×2): 0.698 ug/kg/h via INTRAVENOUS
  Administered 2015-04-24: 0.7 ug/kg/h via INTRAVENOUS
  Filled 2015-04-23 (×5): qty 50

## 2015-04-23 MED ORDER — PREDNISONE 5 MG/ML PO CONC
60.0000 mg | Freq: Every day | ORAL | Status: DC
  Administered 2015-04-25 – 2015-04-26 (×2): 60 mg via ORAL
  Filled 2015-04-23 (×5): qty 12

## 2015-04-23 MED ORDER — DIAZEPAM 1 MG/ML PO SOLN
2.0000 mg | Freq: Two times a day (BID) | ORAL | Status: DC
  Administered 2015-04-23 (×2): 2 mg
  Filled 2015-04-23 (×2): qty 5

## 2015-04-23 MED ORDER — LEVETIRACETAM 100 MG/ML PO SOLN
500.0000 mg | Freq: Two times a day (BID) | ORAL | Status: DC
  Administered 2015-04-23 – 2015-04-27 (×10): 500 mg
  Filled 2015-04-23 (×14): qty 5

## 2015-04-23 MED ORDER — POTASSIUM CHLORIDE 20 MEQ/15ML (10%) PO SOLN
40.0000 meq | Freq: Once | ORAL | Status: AC
  Administered 2015-04-23: 40 meq
  Filled 2015-04-23: qty 30

## 2015-04-23 NOTE — Progress Notes (Signed)
[place] Progress Note Patient Name: Jasmine [name] DOB: 01/31/1990 MRN: [id number]   Date of Service  04/23/2015  HPI/Events of Note  Patient with hypertension BP 166/105 (126) with HR of 73.  eICU Interventions  Plan: Hydralazine 10 mg IV q 2 hours prn systolic BP greater than 0000000 mmHg     Intervention Category Intermediate Interventions: Hypertension - evaluation and management  Kayler Buckholtz 04/23/2015, 12:25 AM

## 2015-04-23 NOTE — Progress Notes (Signed)
Name: Jasmine [name] MRN: [id number] DOB: February 12, 1990    ADMISSION DATE:  04/19/2015 CONSULTATION DATE:  04/20/2015  REFERRING MD : Marzetta Board, M.D. / [facility]  CHIEF COMPLAINT:  Asthma Exacerbation  BRIEF PATIENT DESCRIPTION: 25 year old female with long history of asthma with prior intubations. Presented with asthma exacerbation for approximately 3 days.  SIGNIFICANT EVENTS  4/4 - Admit  STUDIES:  CXR PA/LAT 4/4:  Previously reviewed by me. Normal lung volumes. No evidence of hyperinflation. No focal opacity or mass appreciated. No pleural effusion. Heart normal in size. Mediastinum normal in contour. PORT CXR 4/6:  Personally reviewed by me. No focal opacity or effusion. Orogastric tube below diaphragm and appears to be in the stomach. Endotracheal tube in good position. CT ABD/PELVIS W/O 4/6: No intra-abdominal process in abdomen or pelvis. Cholelithiasis noted. Bartholin's gland cyst suspected on the left measuring 10 mm.  MICROBIOLOGY: Tracheal Asp Ctx 4/6>>> Respiratory Viral Panel PCR Trach Asp 4/6:  Metapneumovirus  Respiratory Panel PCR NP Swab 4/5:  Metapneumovirus Influenza PCR 4/5:  Negative  Blood Ctx x2 4/4>>> MRSA PCR 4/5:  Negative HIV 4/4:  Negative   ANTIBIOTICS: Levaquin 4/4>>>  LINES/TUBES: OETT 7.5 4/5>>> OGT 4/5>>> FOLE 4/5>>> PIV x2  SUBJECTIVE: Started on Precedex yesterday for anxiety/agitation. Also started on tube feeds yesterday. Patient reports chest discomfort as well as discomfort from her OETT.  REVIEW OF SYSTEMS:  Unable to obtain given intubation & sedation.  VITAL SIGNS: Temp:  [97.6 F (36.4 C)-98.3 F (36.8 C)] 98.3 F (36.8 C) (04/08 0000) Pulse Rate:  [59-88] 68 (04/08 0301) Resp:  [14-23] 15 (04/08 0600) BP: (120-171)/(53-116) 142/87 mmHg (04/08 0600) SpO2:  [100 %] 100 % (04/08 0600) FiO2 (%):  [30 %] 30 % (04/08 0301) Weight:  [185 lb 10 oz (84.2 kg)-187 lb 2.7 oz (84.9 kg)] 185 lb 10 oz (84.2 kg) (04/08 0500)  PHYSICAL  EXAMINATION: General:  Sedated. No distress. No family at bedside.  Integument:  Warm & dry. No rash on exposed skin. Lymphatics:  No appreciated cervical or supraclavicular lymphadenoapthy. HEENT:  Endotracheal tube in place. No scleral injection or icterus.  Cardiovascular:  Regular rhythm. No edema. No appreciable JVD.  Pulmonary:  Symmetric chest wall rise on ventilator. Clear to auscultation without wheezing. Abdomen: Soft. Nondistended. Normal bowel sounds. Nontender. Neurological:  Sedated. Pupils symmetric. Following some commands. Grossly nonfocal. .    Recent Labs Lab 04/21/15 0308 04/22/15 0311 04/23/15 0623  NA 141  143 145 141  K 4.5  4.6 3.9 3.2*  CL 114*  114* 113* 111  CO2 19*  19* 23 23  BUN 6  6 8 12   CREATININE 0.66  0.58 0.56 0.57  GLUCOSE 114*  117* 121* 131*    Recent Labs Lab 04/21/15 0308 04/22/15 0311 04/23/15 0623  HGB 11.0* 10.5* 10.9*  HCT 33.8* 32.4* 33.7*  WBC 25.5* 24.5* 18.0*  PLT 226 252 215   Ct Abdomen Pelvis Wo Contrast  04/21/2015  CLINICAL DATA:  Abdominal pain. Multiple intubation secondary to asthma exacerbations. Sickle cell trait. Pancreatitis. Gastroesophageal reflux disease. EXAM: CT ABDOMEN AND PELVIS WITHOUT CONTRAST TECHNIQUE: Multidetector CT imaging of the abdomen and pelvis was performed following the standard protocol without IV contrast. COMPARISON:  None. FINDINGS: Lower chest: Dependent bibasilar atelectasis. Trace bilateral pleural effusions. Hepatobiliary: Normal noncontrast appearance the liver. Stone filled gallbladder, without surrounding inflammation or biliary duct dilatation. Mild motion and EKG lead artifact degradation. Pancreas: Normal, without mass or ductal dilatation. Spleen: Normal in size, without  focal abnormality. Adrenals/Urinary Tract: Normal adrenal glands. No renal calculi or hydronephrosis. Urinary bladder decompressed around a Foley catheter. Stomach/Bowel: Nasogastric tube terminates at the  gastric body. Otherwise normal stomach. Normal colon and terminal ileum. Probable appendectomy. Normal small bowel. Vascular/Lymphatic: Normal caliber of the aorta and branch vessels. No abdominopelvic adenopathy. Reproductive: Normal uterus and adnexa. Left perineal low-density lesion measures 10 mm on image 91/series 2. Other: No significant free fluid. Musculoskeletal: No acute osseous abnormality. IMPRESSION: 1.  No acute process in the abdomen or pelvis. 2. Cholelithiasis. 3. Decreased sensitivity and specificity exam due to technique related factors, as described above. 4. Suspicion of a left Bartholin's gland cyst of 10 mm. Consider physical exam correlation. Electronically Signed   By: Abigail Miyamoto M.D.   On: 04/21/2015 17:52    ASSESSMENT / PLAN:  25 year old female with long history of asthma and prior intubations. Yesterday for worsening respiratory status. Abdominal pain of unclear significance without any obvious abnormality on CT imaging or lab testing. Suspect element of vocal cord dysfunction given severe agitation and anxiety in this patient. Patient's anxiety and agitation are the main barriers to extubation. Seems to be improving clinically with regards to her asthma exacerbation.  1. Acute hypercarbic respiratory failure: Status post intubation 4/5. Resolving. Continuing full ventilator support for now. Spontaneous breathing trial in the morning as long as anxiety is well-controlled. 2. Asthma exacerbation: Continue prednisone 60 mg via tube. Continuing DuoNeb every 4 hours.  3. CAP: Viral panel positive for Metapneumovirus. Awaiting tracheal aspirate culture. Continuing empiric Levaquin Day#5. 4. Abdominal Pain: Unclear etiology. Continue to monitor symptoms with lightening of sedation. 5. Sedation on Ventilator/Anxiety:  Goal RASS 0 to -1. Continuing fentanyl and Precedex drips. Continuing IV Versed when necessary. Starting Valium via the tube every 12 hours. 6. Hypertension: Continue  hydralazine IV when necessary. 7. Hypokalemia: Replacing with 40 mEq via tube. Repeat electrolyte panel in the morning. 8. H/O Epilepsy: Keppra 500 mg VT q 12 hours. 9. H/O GERD:  Protonix IV daily. 10. Diet: Tube feedings. 11. Prophylaxis: Lovenox subcutaneous daily, SCDs, & Protonix IV daily at daily.  I have spent a total of 32 minutes of critical care time today caring for the patient and reviewing the patient's electronic medical record.  Sonia Baller Ashok Cordia, M.D. [facility] Pulmonary & Critical Care Pager:  [phone] After 3pm or if no response, call [phone] 04/23/2015, 7:55 AM

## 2015-04-24 DIAGNOSIS — R569 Unspecified convulsions: Secondary | ICD-10-CM

## 2015-04-24 LAB — CBC WITH DIFFERENTIAL/PLATELET
BASOS ABS: 0 10*3/uL (ref 0.0–0.1)
Basophils Relative: 0 %
EOS ABS: 0 10*3/uL (ref 0.0–0.7)
Eosinophils Relative: 0 %
HCT: 36.2 % (ref 36.0–46.0)
Hemoglobin: 12 g/dL (ref 12.0–15.0)
LYMPHS ABS: 3.7 10*3/uL (ref 0.7–4.0)
Lymphocytes Relative: 20 %
MCH: 27.5 pg (ref 26.0–34.0)
MCHC: 33.1 g/dL (ref 30.0–36.0)
MCV: 83 fL (ref 78.0–100.0)
MONO ABS: 1.7 10*3/uL — AB (ref 0.1–1.0)
Monocytes Relative: 9 %
NEUTROS PCT: 71 %
Neutro Abs: 13.2 10*3/uL — ABNORMAL HIGH (ref 1.7–7.7)
PLATELETS: 251 10*3/uL (ref 150–400)
RBC: 4.36 MIL/uL (ref 3.87–5.11)
RDW: 16.8 % — AB (ref 11.5–15.5)
WBC: 18.6 10*3/uL — AB (ref 4.0–10.5)

## 2015-04-24 LAB — RENAL FUNCTION PANEL
ANION GAP: 10 (ref 5–15)
Albumin: 3.3 g/dL — ABNORMAL LOW (ref 3.5–5.0)
BUN: 12 mg/dL (ref 6–20)
CALCIUM: 8.7 mg/dL — AB (ref 8.9–10.3)
CO2: 23 mmol/L (ref 22–32)
CREATININE: 0.62 mg/dL (ref 0.44–1.00)
Chloride: 107 mmol/L (ref 101–111)
Glucose, Bld: 111 mg/dL — ABNORMAL HIGH (ref 65–99)
Phosphorus: 2.6 mg/dL (ref 2.5–4.6)
Potassium: 3.5 mmol/L (ref 3.5–5.1)
SODIUM: 140 mmol/L (ref 135–145)

## 2015-04-24 LAB — MAGNESIUM: MAGNESIUM: 1.9 mg/dL (ref 1.7–2.4)

## 2015-04-24 LAB — GLUCOSE, CAPILLARY
GLUCOSE-CAPILLARY: 114 mg/dL — AB (ref 65–99)
GLUCOSE-CAPILLARY: 119 mg/dL — AB (ref 65–99)
GLUCOSE-CAPILLARY: 126 mg/dL — AB (ref 65–99)
Glucose-Capillary: 103 mg/dL — ABNORMAL HIGH (ref 65–99)
Glucose-Capillary: 105 mg/dL — ABNORMAL HIGH (ref 65–99)
Glucose-Capillary: 112 mg/dL — ABNORMAL HIGH (ref 65–99)

## 2015-04-24 MED ORDER — MIDAZOLAM HCL 2 MG/2ML IJ SOLN
1.0000 mg | INTRAMUSCULAR | Status: DC | PRN
  Administered 2015-04-24 – 2015-04-26 (×6): 2 mg via INTRAVENOUS
  Filled 2015-04-24 (×7): qty 2

## 2015-04-24 MED ORDER — DIAZEPAM 1 MG/ML PO SOLN
5.0000 mg | Freq: Three times a day (TID) | ORAL | Status: DC
  Administered 2015-04-24 – 2015-04-25 (×4): 5 mg
  Filled 2015-04-24 (×4): qty 5

## 2015-04-24 MED ORDER — QUETIAPINE FUMARATE 50 MG PO TABS
50.0000 mg | ORAL_TABLET | Freq: Two times a day (BID) | ORAL | Status: DC
Start: 2015-04-24 — End: 2015-04-27
  Administered 2015-04-24 – 2015-04-26 (×6): 50 mg
  Filled 2015-04-24 (×6): qty 1

## 2015-04-24 MED ORDER — FENTANYL BOLUS VIA INFUSION
50.0000 ug | INTRAVENOUS | Status: DC | PRN
  Administered 2015-04-24: 100 ug via INTRAVENOUS
  Filled 2015-04-24: qty 100

## 2015-04-24 MED ORDER — DEXMEDETOMIDINE HCL IN NACL 200 MCG/50ML IV SOLN
0.4000 ug/kg/h | INTRAVENOUS | Status: DC
  Administered 2015-04-24: 1.2 ug/kg/h via INTRAVENOUS
  Administered 2015-04-24: 1.439 ug/kg/h via INTRAVENOUS
  Administered 2015-04-24: 1.2 ug/kg/h via INTRAVENOUS
  Administered 2015-04-24 (×8): 1.5 ug/kg/h via INTRAVENOUS
  Administered 2015-04-25: 1.2 ug/kg/h via INTRAVENOUS
  Administered 2015-04-25 (×2): 1.5 ug/kg/h via INTRAVENOUS
  Administered 2015-04-25 (×2): 1.2 ug/kg/h via INTRAVENOUS
  Administered 2015-04-25 (×2): 1.5 ug/kg/h via INTRAVENOUS
  Administered 2015-04-25: 1.2 ug/kg/h via INTRAVENOUS
  Administered 2015-04-25: 1.5 ug/kg/h via INTRAVENOUS
  Administered 2015-04-25 – 2015-04-26 (×3): 1.2 ug/kg/h via INTRAVENOUS
  Administered 2015-04-26: 1 ug/kg/h via INTRAVENOUS
  Administered 2015-04-26: 1.2 ug/kg/h via INTRAVENOUS
  Administered 2015-04-26: 0.7 ug/kg/h via INTRAVENOUS
  Administered 2015-04-26 (×2): 0.8 ug/kg/h via INTRAVENOUS
  Administered 2015-04-26: 1.2 ug/kg/h via INTRAVENOUS
  Administered 2015-04-26: 0.7 ug/kg/h via INTRAVENOUS
  Administered 2015-04-27: 0.5 ug/kg/h via INTRAVENOUS
  Filled 2015-04-24 (×2): qty 50
  Filled 2015-04-24: qty 100
  Filled 2015-04-24 (×3): qty 50
  Filled 2015-04-24: qty 100
  Filled 2015-04-24 (×3): qty 50
  Filled 2015-04-24: qty 100
  Filled 2015-04-24 (×4): qty 50
  Filled 2015-04-24: qty 150
  Filled 2015-04-24 (×3): qty 50
  Filled 2015-04-24: qty 100
  Filled 2015-04-24 (×4): qty 50
  Filled 2015-04-24 (×2): qty 100

## 2015-04-24 NOTE — Progress Notes (Signed)
Around 0305, pt became agitated with elevated RR, pt was administered 2 mg versed at 0310 to help with agitation. Pt began to breath faster and inc WOB on the vent AND was unable to arouse. RT was called to administer breathing treatment per elink nurse's suggestion. At 0317, pt began to [facility] uncontrollably; she was having a grand mal seizure that lasted for about 1 minute. Pt has history of seizure.  MD was called. While waiting for MD, pt aroused and opened eyes, RR 20-30. MD recommended close monitoring and gave order to increase sedation. Pt extremely anxious.

## 2015-04-24 NOTE — Progress Notes (Signed)
[place] Progress Note Patient Name: Jasmine [name] DOB: [birth year] MRN: [id number]   Date of Service  04/24/2015  HPI/Events of Note  Agitation requiring versed but then abrupt onset of elevated RR and ?drop in sats.  Probe not picking up.  By the time I camera checked on the patient she was more calm, pulling adequate TVs on vent with RR in the 20s and moving air per RT report.  On 0.7 mcg of precedex max dose per nurse report.  eICU Interventions  Plan: Continue to monitor patient Increase max dose of precedex to vent levels to 1.2 Continue to monitor patient     Intervention Category Intermediate Interventions: Respiratory distress - evaluation and management;Other:  DETERDING,ELIZABETH 04/24/2015, 3:23 AM

## 2015-04-24 NOTE — Progress Notes (Signed)
Pt transported from 1227 to 1237 for safety reasons.  Pt remained stable throughout transport.  No adverse effects noted.  Oral and airway suction performed prior to transport.

## 2015-04-24 NOTE — Progress Notes (Addendum)
Name: Jasmine [name] MRN: [id number] DOB: 1990-05-08    ADMISSION DATE:  04/19/2015 CONSULTATION DATE:  04/20/2015  REFERRING MD : Marzetta Board, M.D. / [facility]  CHIEF COMPLAINT:  Asthma Exacerbation  BRIEF PATIENT DESCRIPTION: 25 year old female with long history of asthma with prior intubations. Presented with asthma exacerbation for approximately 3 days.  SIGNIFICANT EVENTS  4/4 - Admit  STUDIES:  CXR PA/LAT 4/4:  Previously reviewed by me. Normal lung volumes. No evidence of hyperinflation. No focal opacity or mass appreciated. No pleural effusion. Heart normal in size. Mediastinum normal in contour. PORT CXR 4/6:  Personally reviewed by me. No focal opacity or effusion. Orogastric tube below diaphragm and appears to be in the stomach. Endotracheal tube in good position. CT ABD/PELVIS W/O 4/6: No intra-abdominal process in abdomen or pelvis. Cholelithiasis noted. Bartholin's gland cyst suspected on the left measuring 10 mm.  MICROBIOLOGY: Tracheal Asp Ctx 4/6:  Oral [place] Respiratory Viral Panel PCR Trach Asp 4/6:  Metapneumovirus  Respiratory Panel PCR NP Swab 4/5:  Metapneumovirus Influenza PCR 4/5:  Negative  Blood Ctx x2 4/4>>> MRSA PCR 4/5:  Negative HIV 4/4:  Negative   ANTIBIOTICS: Levaquin 4/4>>>  LINES/TUBES: OETT 7.5 4/5>>> OGT 4/5>>> FOLE 4/5>>> PIV x2  SUBJECTIVE: Increasing agitation. Patient signifies she is having trouble breathing despite maximal ventilator support. Dozing off easily but still tachypneic.   REVIEW OF SYSTEMS:  Unable to obtain given intubation & sedation.  VITAL SIGNS: Temp:  [97.7 F (36.5 C)-100 F (37.8 C)] 97.7 F (36.5 C) (04/09 0443) Pulse Rate:  [61-75] 75 (04/09 0337) Resp:  [15-32] 27 (04/09 0800) BP: (124-169)/(63-105) 155/93 mmHg (04/09 0800) SpO2:  [98 %-100 %] 100 % (04/09 0800) FiO2 (%):  [30 %] 30 % (04/09 0822) Weight:  [182 lb 5.1 oz (82.7 kg)] 182 lb 5.1 oz (82.7 kg) (04/09 0500)  PHYSICAL EXAMINATION: General:   Sedated but opens eyes easily. No family at bedside. Integument:  Warm & dry. No rash on exposed skin. Lymphatics:  No appreciated cervical or supraclavicular lymphadenoapthy. HEENT:  Endotracheal tube in place. No scleral injection or icterus.  Cardiovascular:  Regular rhythm but tachycardic. No edema. No appreciable JVD.  Pulmonary:  Symmetric chest wall rise on ventilator. Clear to auscultation without wheezing. Tachypnea. Abdomen: Soft. Nondistended. Normal bowel sounds.  Neurological:  Sedated. Pupils symmetric. Nods to questions. .    Recent Labs Lab 04/22/15 0311 04/23/15 0623 04/24/15 0326  NA 145 141 140  K 3.9 3.2* 3.5  CL 113* 111 107  CO2 23 23 23   BUN 8 12 12   CREATININE 0.56 0.57 0.62  GLUCOSE 121* 131* 111*    Recent Labs Lab 04/22/15 0311 04/23/15 0623 04/24/15 0326  HGB 10.5* 10.9* 12.0  HCT 32.4* 33.7* 36.2  WBC 24.5* 18.0* 18.6*  PLT 252 215 251   No results found.  ASSESSMENT / PLAN:   PULMONARY A: Acute Hypercarbic Respiratory Failure - Resolving Asthma Exacerbation - H/O Multiple intubations. CAP Possible VCD  P: Full Vent Support Prednisone 60mg  VT daily Duoneb q4hr SBT as mental status allows  CARDIOLOGY A: HTN Prolonged QTc - Resolved. QTc 446 on 4/9 EKG.  P: Monitor on telemetry Vitals per unit protocol Hydralazine IV prn EKG in AM 4/10  NEPHROLOGY A: Hypokalemia - Resolved.  P: Trending UOP with Foley Monitoring renal function & electrolytes daily Replacing electrolytes as indicated  GASTROENTEROLOGY A: Abdominal/Left Flank Pain - CT & labs negative H/O GERD  P: Continue TFs Protonix IV daily NPO  HEMATOLOGY/ONCOLOGY A: Leukocytosis - Likely reactive vs acute infection. Improving.  P: Trending cell counts daily w/ CBC SCDs Lovenox [place] daily  INFECTIOUS DISEASES A: Metapneumovirus Infection CAP  P: Plan to reculture for fever Levaquin Day #6 - Discontinue today  NEUROLOGY A:   Sedation on  Ventilator Possible Seizure  H/O Epilepsy Anxiety  P: Goal RASS:  0 to -1 Seizure precautions Increasing Max Precedex gtt Fentanyl gtt & IV prn Versed IV prn Valium VT q8hr Senna VT bid  Keppra VT bid EEG today  TODAY'S SUMMARY:  25 year old female with long history of asthma and prior intubations. Yesterday for worsening respiratory status. Abdominal pain of unclear significance without any obvious abnormality on CT imaging or lab testing. Significant agitation & now questionable seizure overnight. Checking EEG. Starting Seroquel bid w/ repeat EKG in the AM.  I have spent a total of 33 minutes of critical care time today caring for the patient and reviewing the patient's electronic medical record.  Sonia Baller Ashok Cordia, M.D. [facility] Pulmonary & Critical Care Pager:  [phone] After 3pm or if no response, call [phone] 04/24/2015, 8:25 AM

## 2015-04-24 NOTE — Progress Notes (Deleted)
Around 0305, pt became agitated with elevated RR, pt was administered 2 mg versed at 0310 to help with agitation. Pt began to breath faster and inc WOB on the vent AND was unable to arouse. RT was called to administer breathing treatment per elink nurse's suggestion. At 0317, pt began to [facility] uncontrollably, such movement continued for about 1 minute. MD was called. While waiting for MD, pt aroused and opened eyes,  RR 20-30. MD recommended close monitoring and gave order to increase sedation. Pt extremely anxious.

## 2015-04-25 ENCOUNTER — Other Ambulatory Visit (HOSPITAL_COMMUNITY): Payer: Medicaid Other

## 2015-04-25 DIAGNOSIS — J9620 Acute and chronic respiratory failure, unspecified whether with hypoxia or hypercapnia: Secondary | ICD-10-CM

## 2015-04-25 LAB — RENAL FUNCTION PANEL
Albumin: 3.2 g/dL — ABNORMAL LOW (ref 3.5–5.0)
Anion gap: 8 (ref 5–15)
BUN: 12 mg/dL (ref 6–20)
CHLORIDE: 109 mmol/L (ref 101–111)
CO2: 27 mmol/L (ref 22–32)
CREATININE: 0.65 mg/dL (ref 0.44–1.00)
Calcium: 8.7 mg/dL — ABNORMAL LOW (ref 8.9–10.3)
GFR calc non Af Amer: >60 mL/min (ref >60)
Glucose, Bld: 134 mg/dL — ABNORMAL HIGH (ref 65–99)
POTASSIUM: 3.4 mmol/L — AB (ref 3.5–5.1)
Phosphorus: 3.3 mg/dL (ref 2.5–4.6)
Sodium: 144 mmol/L (ref 135–145)

## 2015-04-25 LAB — CBC WITH DIFFERENTIAL/PLATELET
BASOS ABS: 0 10*3/uL (ref 0.0–0.1)
BASOS PCT: 0 %
Eosinophils Absolute: 0.1 10*3/uL (ref 0.0–0.7)
Eosinophils Relative: 1 %
HEMATOCRIT: 36.3 % (ref 36.0–46.0)
HEMOGLOBIN: 11.8 g/dL — AB (ref 12.0–15.0)
LYMPHS PCT: 27 %
Lymphs Abs: 3.5 10*3/uL (ref 0.7–4.0)
MCH: 26.7 pg (ref 26.0–34.0)
MCHC: 32.5 g/dL (ref 30.0–36.0)
MCV: 82.1 fL (ref 78.0–100.0)
MONO ABS: 1.1 10*3/uL — AB (ref 0.1–1.0)
Monocytes Relative: 8 %
NEUTROS ABS: 8.3 10*3/uL — AB (ref 1.7–7.7)
NEUTROS PCT: 64 %
Platelets: 196 10*3/uL (ref 150–400)
RBC: 4.42 MIL/uL (ref 3.87–5.11)
RDW: 16.2 % — AB (ref 11.5–15.5)
WBC: 12.9 10*3/uL — ABNORMAL HIGH (ref 4.0–10.5)

## 2015-04-25 LAB — CULTURE, BLOOD (ROUTINE X 2)
Culture: NO GROWTH
Culture: NO GROWTH

## 2015-04-25 LAB — GLUCOSE, CAPILLARY
GLUCOSE-CAPILLARY: 118 mg/dL — AB (ref 65–99)
GLUCOSE-CAPILLARY: 120 mg/dL — AB (ref 65–99)
GLUCOSE-CAPILLARY: 123 mg/dL — AB (ref 65–99)
GLUCOSE-CAPILLARY: 140 mg/dL — AB (ref 65–99)
Glucose-Capillary: 117 mg/dL — ABNORMAL HIGH (ref 65–99)
Glucose-Capillary: 145 mg/dL — ABNORMAL HIGH (ref 65–99)

## 2015-04-25 LAB — MAGNESIUM: MAGNESIUM: 1.9 mg/dL (ref 1.7–2.4)

## 2015-04-25 MED ORDER — POTASSIUM CHLORIDE 20 MEQ/15ML (10%) PO SOLN
40.0000 meq | Freq: Once | ORAL | Status: AC
  Administered 2015-04-25: 40 meq
  Filled 2015-04-25: qty 30

## 2015-04-25 MED ORDER — LORAZEPAM 2 MG/ML IJ SOLN
INTRAMUSCULAR | Status: AC
  Administered 2015-04-25: 2 mg via INTRAVENOUS
  Filled 2015-04-25: qty 1

## 2015-04-25 MED ORDER — LORAZEPAM 2 MG/ML IJ SOLN
2.0000 mg | Freq: Once | INTRAMUSCULAR | Status: AC
  Administered 2015-04-25: 2 mg via INTRAVENOUS

## 2015-04-25 MED ORDER — LORAZEPAM 2 MG/ML PO CONC
1.0000 mg | Freq: Three times a day (TID) | ORAL | Status: DC
  Administered 2015-04-25 – 2015-04-26 (×3): 1 mg
  Filled 2015-04-25 (×3): qty 1

## 2015-04-25 NOTE — Progress Notes (Signed)
While providing pt care, at 0625 Pt had approximately 10-20 seconds seizure-like activity and increased RR, not responding to voice or touch during this time.  Pt began responding to commands less than one minute after this activity ceased. At [place] patient's mother was called to notify of room change and updated, including seizure-like activity. Pt's mother plans to come this morning.

## 2015-04-25 NOTE — Progress Notes (Signed)
Name: Jasmine [name] MRN: [id number] DOB: 12/08/1990    ADMISSION DATE:  04/19/2015 CONSULTATION DATE:  04/20/2015  REFERRING MD : Marzetta Board, M.D. / [facility]  CHIEF COMPLAINT:  Asthma Exacerbation  BRIEF PATIENT DESCRIPTION: 25 year old female with long history of asthma with prior intubations. Presented with asthma exacerbation for approximately 3 days.  SIGNIFICANT EVENTS  4/4 - Admit  STUDIES:  CT ABD/PELVIS W/O 4/6: No intra-abdominal process in abdomen or pelvis. Cholelithiasis noted. Bartholin's gland cyst suspected on the left measuring 10 mm.  MICROBIOLOGY: Tracheal Asp Ctx 4/6:  Oral [place] Respiratory Viral Panel PCR Trach Asp 4/6:  Metapneumovirus  Respiratory Panel PCR NP Swab 4/5:  Metapneumovirus Influenza PCR 4/5:  Negative  Blood Ctx x2 4/4>>>ng MRSA PCR 4/5:  Negative HIV 4/4:  Negative   ANTIBIOTICS: Levaquin 4/4>>>  LINES/TUBES: OETT 7.5 4/5>>> OGT 4/5>>> FOLE 4/5>>> PIV x2  SUBJECTIVE:  Afebrile Increasing agitation & anxiety when awake. Patient signifies she is having trouble breathing despite maximal ventilator support.  very tachypneic.   REVIEW OF SYSTEMS:  Unable to obtain given intubation & sedation.  VITAL SIGNS: Temp:  [98.6 F (37 C)-100.8 F (38.2 C)] 99.8 F (37.7 C) (04/10 0834) Pulse Rate:  [68-103] 73 (04/10 0700) Resp:  [14-19] 15 (04/10 0800) BP: (123-154)/(65-100) 154/90 mmHg (04/10 0800) SpO2:  [100 %] 100 % (04/10 0827) FiO2 (%):  [30 %] 30 % (04/10 0827) Weight:  [177 lb 7.5 oz (80.5 kg)] 177 lb 7.5 oz (80.5 kg) (04/10 0500)  PHYSICAL EXAMINATION: General:  Sedated but opens eyes easily.  Integument:  Warm & dry. No rash on exposed skin. Lymphatics:  No appreciated cervical or supraclavicular lymphadenoapthy. HEENT:  Endotracheal tube in place. No scleral injection or icterus.  Cardiovascular:  Regular rhythm but tachycardic. No edema. No appreciable JVD.  Pulmonary:  Symmetric chest wall rise on ventilator. Clear  to auscultation without wheezing. Tachypnea. Abdomen: Soft. Nondistended. Normal bowel sounds.  Neurological:  Interactive, can write to communicatePupils symmetric. Nods to questions. .    Recent Labs Lab 04/23/15 0623 04/24/15 0326 04/25/15 0347  NA 141 140 144  K 3.2* 3.5 3.4*  CL 111 107 109  CO2 23 23 27   BUN 12 12 12   CREATININE 0.57 0.62 0.65  GLUCOSE 131* 111* 134*    Recent Labs Lab 04/23/15 0623 04/24/15 0326 04/25/15 0502  HGB 10.9* 12.0 11.8*  HCT 33.7* 36.2 36.3  WBC 18.0* 18.6* 12.9*  PLT 215 251 196   No results found.  ASSESSMENT / PLAN:   PULMONARY A: Acute Hypercarbic Respiratory Failure - Resolving Asthma Exacerbation - H/O Multiple intubations  CAP Possible VCD  P: Full Vent Support Prednisone 60mg  VT daily Duoneb q4hr SBT as mental status/ anxiety allows -severe anxiety precludes extubation-suspect asthma is resolved now  CARDIOLOGY A: HTN Prolonged QTc - Resolved. QTc 446 on 4/9 & 4/10 EKG.  P: Monitor on telemetry Hydralazine IV prn   NEPHROLOGY A: Hypokalemia   P: Trending UOP with Foley Monitoring renal function & electrolytes daily Replacing electrolytes as indicated  GASTROENTEROLOGY A: Abdominal/Left Flank Pain - CT & labs negative H/O GERD  P: Continue TFs Protonix IV daily NPO  HEMATOLOGY/ONCOLOGY A: Leukocytosis - Likely reactive vs acute infection. Improving.  P: Trending cell counts daily w/ CBC SCDs Lovenox [place] daily  INFECTIOUS DISEASES A: Metapneumovirus Infection CAP  P: Plan to reculture for fever Levaquin Day #6 - Discontinue today  NEUROLOGY A:   Possible Seizure  H/O Epilepsy Severe Anxiety  P: Goal RASS:  0 to -1 Seizure precautions ct Precedex gtt Fentanyl gtt & IV prn Versed IV prn Dc Valium - start ativan VT q8hr seroquel 50 bid Senna VT bid  Keppra VT bid EEG today  TODAY'S SUMMARY:  Severe anxiety seems to be the main issue now, asthma resolved Seizure being  investigated but clearly psych issues predominate   The patient is critically ill with multiple organ systems failure and requires high complexity decision making for assessment and support, frequent evaluation and titration of therapies, application of advanced monitoring technologies and extensive interpretation of multiple databases. Critical Care Time devoted to patient care services described in this note independent of APP time is 32 minutes.   Kara Mead MD. [place] [facility].  Pulmonary & Critical care Pager [phone] If no response call [phone]   04/25/2015    04/25/2015, 9:53 AM

## 2015-04-26 ENCOUNTER — Other Ambulatory Visit (HOSPITAL_COMMUNITY): Payer: Medicaid Other

## 2015-04-26 ENCOUNTER — Inpatient Hospital Stay (HOSPITAL_COMMUNITY): Payer: Medicaid Other

## 2015-04-26 DIAGNOSIS — F4323 Adjustment disorder with mixed anxiety and depressed mood: Secondary | ICD-10-CM

## 2015-04-26 LAB — CBC WITH DIFFERENTIAL/PLATELET
BASOS PCT: 0 %
Basophils Absolute: 0 10*3/uL (ref 0.0–0.1)
EOS ABS: 0 10*3/uL (ref 0.0–0.7)
EOS PCT: 0 %
HCT: 34 % — ABNORMAL LOW (ref 36.0–46.0)
HEMOGLOBIN: 11.2 g/dL — AB (ref 12.0–15.0)
LYMPHS ABS: 3.7 10*3/uL (ref 0.7–4.0)
Lymphocytes Relative: 19 %
MCH: 26.7 pg (ref 26.0–34.0)
MCHC: 32.9 g/dL (ref 30.0–36.0)
MCV: 81.1 fL (ref 78.0–100.0)
MONO ABS: 1.5 10*3/uL — AB (ref 0.1–1.0)
MONOS PCT: 8 %
NEUTROS PCT: 73 %
Neutro Abs: 14.4 10*3/uL — ABNORMAL HIGH (ref 1.7–7.7)
Platelets: 215 10*3/uL (ref 150–400)
RBC: 4.19 MIL/uL (ref 3.87–5.11)
RDW: 15.9 % — AB (ref 11.5–15.5)
WBC: 19.6 10*3/uL — ABNORMAL HIGH (ref 4.0–10.5)

## 2015-04-26 LAB — RENAL FUNCTION PANEL
ALBUMIN: 3.4 g/dL — AB (ref 3.5–5.0)
ANION GAP: 9 (ref 5–15)
BUN: 16 mg/dL (ref 6–20)
CALCIUM: 9.1 mg/dL (ref 8.9–10.3)
CO2: 26 mmol/L (ref 22–32)
CREATININE: 0.64 mg/dL (ref 0.44–1.00)
Chloride: 107 mmol/L (ref 101–111)
GFR calc Af Amer: >60 mL/min (ref >60)
GFR calc non Af Amer: >60 mL/min (ref >60)
GLUCOSE: 122 mg/dL — AB (ref 65–99)
PHOSPHORUS: 3.7 mg/dL (ref 2.5–4.6)
Potassium: 3.7 mmol/L (ref 3.5–5.1)
SODIUM: 142 mmol/L (ref 135–145)

## 2015-04-26 LAB — GLUCOSE, CAPILLARY
GLUCOSE-CAPILLARY: 120 mg/dL — AB (ref 65–99)
GLUCOSE-CAPILLARY: 144 mg/dL — AB (ref 65–99)
GLUCOSE-CAPILLARY: 122 mg/dL — AB (ref 65–99)
Glucose-Capillary: 145 mg/dL — ABNORMAL HIGH (ref 65–99)
Glucose-Capillary: 146 mg/dL — ABNORMAL HIGH (ref 65–99)
Glucose-Capillary: 111 mg/dL — ABNORMAL HIGH (ref 65–99)

## 2015-04-26 LAB — MAGNESIUM: Magnesium: 2.1 mg/dL (ref 1.7–2.4)

## 2015-04-26 LAB — LEVETIRACETAM LEVEL: Levetiracetam Lvl: 6.2 ug/mL — ABNORMAL LOW (ref 10.0–40.0)

## 2015-04-26 MED ORDER — CETYLPYRIDINIUM CHLORIDE 0.05 % MT LIQD
7.0000 mL | Freq: Two times a day (BID) | OROMUCOSAL | Status: DC
  Administered 2015-04-26 – 2015-04-27 (×3): 7 mL via OROMUCOSAL

## 2015-04-26 MED ORDER — LORAZEPAM 2 MG/ML IJ SOLN
0.5000 mg | Freq: Three times a day (TID) | INTRAMUSCULAR | Status: DC
  Administered 2015-04-26 – 2015-04-28 (×6): 1 mg via INTRAVENOUS
  Filled 2015-04-26 (×6): qty 1

## 2015-04-26 NOTE — Progress Notes (Signed)
Nutrition Follow-up  DOCUMENTATION CODES:   Not applicable  INTERVENTION:  -RD continue to monitor -Diet advancement per MD  NUTRITION DIAGNOSIS:   Inadequate oral intake related to inability to eat as evidenced by NPO status.  ongoing  GOAL:   Patient will meet greater than or equal to 90% of their needs  Not meeting  MONITOR:   Diet advancement, PO intake, Labs, I & O's, Weight trends  ASSESSMENT:   Jasmine [name] is a 25 y.o. female with PMH asthma, history of multiple intubation due to asthma exacerbation, seizure, sickle cell trait, pancreatitis, GERD, who presents with dry cough and shortness of breath.  [name] was extubated this morning. Needs recalculated. Monitor for diet advancement, PO intake, Supplement Needs. Per MD note, she will receive psych consult for anxiety.  Asthma exacerbation seems to have resolved. Cholethiasis was a.lso noted on CT 4/6 -> follow for needs r/t this  Labs: CBG 122 Medications reviewed.   Diet Order:  Diet NPO time specified  Skin:  Reviewed, no issues  Last BM:  PTA  Height:   Ht Readings from Last 1 Encounters:  04/25/15 5\' 5"  (1.651 m)    Weight:   Wt Readings from Last 1 Encounters:  04/26/15 176 lb 5.9 oz (80 kg)    Ideal Body Weight:  56.81 kg  BMI:  Body mass index is 29.35 kg/(m^2).  Estimated Nutritional Needs:   Kcal:  1600-1900 calories (25-30 cal/kg ABW)  Protein:  80-95 grams  Fluid:  >/= 1.6L  EDUCATION NEEDS:   No education needs identified at this time  Satira Anis. Leili Eskenazi, MS, RD LDN After Hours/Weekend Pager [phone]

## 2015-04-26 NOTE — Progress Notes (Signed)
eLink Physician-Brief Progress Note Patient Name: Yejin Fausnaugh DOB: 03-12-1990 MRN: [id number]   Date of Service  04/26/2015  HPI/Events of Note  D/w Ob/gyn, pt was noted to have poor nutrional status with LE edema before surgery. Currently pt is hemodynamically stable, continues to have LE edema, lactic acid now < 2.   eICU Interventions  Can decrease fluid rate to 50cc/hr, possible diuresis in am. Awaiting blood transfusion currently.         Laverle Hobby 04/26/2015, 8:14 PM

## 2015-04-26 NOTE — Progress Notes (Signed)
Patient given oral medication. Patient lethargic, but able to swallow sips of water. RN proceeded to give oral medication. After medication administration patient coughed up 20cc water, RN suctioned patient orally. Patient oxygen saturation remained 100% on 3L [place]. Lung sounds unchanged from 2000 assessment. Will continue to monitor.

## 2015-04-26 NOTE — Procedures (Signed)
Extubation Procedure Note  Patient Details:   Name: Cortina Halim DOB: June 18, 1990 MRN: [id number]   Airway Documentation:     Evaluation  O2 123456 Complications: none Patient tolerated procedure well. Bilateral Breath Sounds: Diminished   Pt able to speak  Per CCM order, pt extubated and placed on nasal cannula.  Pt tolerated procedure well with no complications.  Martha Clan 04/26/2015, 8:28 AM

## 2015-04-26 NOTE — Progress Notes (Signed)
EEG unable to be done at this time, pt has a hair weave. RN made aware and will check with ordering MD Ashok Cordia) to see if he would like to cancel the order or have someone remove the weave.

## 2015-04-26 NOTE — Progress Notes (Signed)
Name: Jasmine [name] MRN: [id number] DOB: December 20, 1990    ADMISSION DATE:  04/19/2015 CONSULTATION DATE:  04/20/2015  REFERRING MD : Marzetta Board, M.D. / [facility]  CHIEF COMPLAINT:  Asthma Exacerbation  BRIEF PATIENT DESCRIPTION: 25 year old female with long history of asthma with prior intubations. Presented with asthma exacerbation for approximately 3 days.  SIGNIFICANT EVENTS  4/4 - Admit  STUDIES:  CT ABD/PELVIS W/O 4/6: No intra-abdominal process in abdomen or pelvis. Cholelithiasis noted. Bartholin's gland cyst suspected on the left measuring 10 mm.  MICROBIOLOGY: Tracheal Asp Ctx 4/6:  Oral [place] Respiratory Viral Panel PCR Trach Asp 4/6:  Metapneumovirus  Respiratory Panel PCR NP Swab 4/5:  Metapneumovirus Influenza PCR 4/5:  Negative  Blood Ctx x2 4/4>>>ng MRSA PCR 4/5:  Negative HIV 4/4:  Negative   ANTIBIOTICS: Levaquin 4/4>>>  LINES/TUBES: OETT 7.5 4/5>>> OGT 4/5>>> FOLE 4/5>>> PIV x2  SUBJECTIVE:  Afebrile Remains agitated & anxiety when awake. Mom at bedside   REVIEW OF SYSTEMS:  Unable to obtain given intubation & sedation.  VITAL SIGNS: Temp:  [98.4 F (36.9 C)-99.8 F (37.7 C)] 98.4 F (36.9 C) (04/11 0800) Pulse Rate:  [58-98] 74 (04/11 0805) Resp:  [15-17] 16 (04/11 0805) BP: (111-154)/(56-106) 143/71 mmHg (04/11 0805) SpO2:  [100 %] 100 % (04/11 0805) FiO2 (%):  [30 %] 30 % (04/11 0728) Weight:  [176 lb 5.9 oz (80 kg)] 176 lb 5.9 oz (80 kg) (04/11 0500)  PHYSICAL EXAMINATION: General:  Acutely ill,but opens eyes easily.  Integument:  Warm & dry. No rash on exposed skin. Lymphatics:  No appreciated cervical or supraclavicular lymphadenoapthy. HEENT:  Endotracheal tube in place. No scleral injection or icterus.  Cardiovascular:  Regular rhythm but tachycardic. No edema. No appreciable JVD.  Pulmonary:  Symmetric chest wall rise on ventilator. Clear to auscultation without wheezing. Tachypnea. Abdomen: Soft. Nondistended. Normal bowel  sounds.  Neurological:  Interactive, can write to communicate Pupils symmetric. Nods to questions. .    Recent Labs Lab 04/24/15 0326 04/25/15 0347 04/26/15 0313  NA 140 144 142  K 3.5 3.4* 3.7  CL 107 109 107  CO2 23 27 26   BUN 12 12 16   CREATININE 0.62 0.65 0.64  GLUCOSE 111* 134* 122*    Recent Labs Lab 04/24/15 0326 04/25/15 0502 04/26/15 0310  HGB 12.0 11.8* 11.2*  HCT 36.2 36.3 34.0*  WBC 18.6* 12.9* 19.6*  PLT 251 196 215   Dg Chest Port 1 View  04/26/2015  CLINICAL DATA:  Respiratory failure. EXAM: PORTABLE CHEST 1 VIEW COMPARISON:  04/21/2015. FINDINGS: Endotracheal tube and NG tube in stable position. Low lung volumes with mild basilar atelectasis mild left base infiltrate cannot be excluded. No pleural effusion pneumothorax. IMPRESSION: 1. Lines and tubes in stable position. 2. Low lung volumes with basilar subsegmental atelectasis again noted. Mild infiltrate in the left lung base cannot be excluded . Electronically Signed   By: Marcello Moores  Register   On: 04/26/2015 07:07    ASSESSMENT / PLAN:   PULMONARY A: Acute Hypercarbic Respiratory Failure - Resolving Asthma Exacerbation - H/O Multiple intubations  CAP Possible VCD  P: Prednisone 60mg  VT daily Duoneb q4hr SBTs with goal extubation-suspect asthma is resolved now  CARDIOLOGY A: HTN Prolonged QTc - Resolved. QTc 446 on 4/9 & 4/10 EKG.  P: Monitor on telemetry Hydralazine IV prn   NEPHROLOGY A: Hypokalemia   P: Trending UOP with Foley Monitoring renal function & electrolytes daily Replacing electrolytes as indicated  GASTROENTEROLOGY A: Abdominal/Left Flank Pain -  CT & labs negative H/O GERD  P: Continue TFs Protonix IV daily NPO  HEMATOLOGY/ONCOLOGY A: Leukocytosis - Likely reactive vs acute infection. Improving.  P: Trending cell counts daily w/ CBC SCDs Lovenox [place] daily  INFECTIOUS DISEASES A: Metapneumovirus Infection CAP  P: Plan to reculture for fever Levaquin  Day #7 - Discontinue today  NEUROLOGY A:   Possible Seizure  H/O Epilepsy Severe Anxiety  P: Goal RASS:  0 to -1 Seizure precautions ct Precedex gtt Dc Fentanyl gtt  Dc Versed IV prn ct ativan VT q8hr seroquel 50 bid Senna VT bid  Keppra VT bid EEG pending  TODAY'S SUMMARY:  Severe anxiety seems to be the main issue now, asthma resolved Seizure being investigated but clearly psych issues predominate, Extubate & get psych consult eventually   The patient is critically ill with multiple organ systems failure and requires high complexity decision making for assessment and support, frequent evaluation and titration of therapies, application of advanced monitoring technologies and extensive interpretation of multiple databases. Critical Care Time devoted to patient care services described in this note independent of APP time is 32 minutes.   Kara Mead MD. [place] Flood. [facility] Pager [phone] If no response call [phone]    04/26/2015, 8:33 AM
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
## 2015-04-26 NOTE — Progress Notes (Addendum)
150 cc of Fentanyl gtt wasted into sink. Witnessed by Shawnie Dapper, RN.

## 2015-04-27 DIAGNOSIS — J4541 Moderate persistent asthma with (acute) exacerbation: Secondary | ICD-10-CM

## 2015-04-27 LAB — GLUCOSE, CAPILLARY
GLUCOSE-CAPILLARY: 102 mg/dL — AB (ref 65–99)
GLUCOSE-CAPILLARY: 110 mg/dL — AB (ref 65–99)
Glucose-Capillary: 103 mg/dL — ABNORMAL HIGH (ref 65–99)
Glucose-Capillary: 91 mg/dL (ref 65–99)
Glucose-Capillary: 100 mg/dL — ABNORMAL HIGH (ref 65–99)
Glucose-Capillary: 109 mg/dL — ABNORMAL HIGH (ref 65–99)

## 2015-04-27 LAB — RENAL FUNCTION PANEL
ALBUMIN: 3.6 g/dL (ref 3.5–5.0)
ANION GAP: 9 (ref 5–15)
BUN: 14 mg/dL (ref 6–20)
CHLORIDE: 107 mmol/L (ref 101–111)
CO2: 26 mmol/L (ref 22–32)
Calcium: 9.4 mg/dL (ref 8.9–10.3)
Creatinine, Ser: 0.44 mg/dL (ref 0.44–1.00)
Glucose, Bld: 107 mg/dL — ABNORMAL HIGH (ref 65–99)
PHOSPHORUS: 3.2 mg/dL (ref 2.5–4.6)
POTASSIUM: 3.6 mmol/L (ref 3.5–5.1)
Sodium: 142 mmol/L (ref 135–145)

## 2015-04-27 LAB — CBC WITH DIFFERENTIAL/PLATELET
BASOS ABS: 0 10*3/uL (ref 0.0–0.1)
BASOS PCT: 0 %
Eosinophils Absolute: 0.1 10*3/uL (ref 0.0–0.7)
Eosinophils Relative: 0 %
HEMATOCRIT: 37.1 % (ref 36.0–46.0)
HEMOGLOBIN: 12.3 g/dL (ref 12.0–15.0)
LYMPHS PCT: 15 %
Lymphs Abs: 2.5 10*3/uL (ref 0.7–4.0)
MCH: 26.9 pg (ref 26.0–34.0)
MCHC: 33.2 g/dL (ref 30.0–36.0)
MCV: 81.2 fL (ref 78.0–100.0)
Monocytes Absolute: 1.1 10*3/uL — ABNORMAL HIGH (ref 0.1–1.0)
Monocytes Relative: 7 %
NEUTROS ABS: 12.8 10*3/uL — AB (ref 1.7–7.7)
NEUTROS PCT: 78 %
Platelets: 219 10*3/uL (ref 150–400)
RBC: 4.57 MIL/uL (ref 3.87–5.11)
RDW: 15.8 % — AB (ref 11.5–15.5)
WBC: 16.5 10*3/uL — AB (ref 4.0–10.5)

## 2015-04-27 LAB — MAGNESIUM: MAGNESIUM: 2 mg/dL (ref 1.7–2.4)

## 2015-04-27 MED ORDER — PANTOPRAZOLE SODIUM 40 MG PO TBEC
40.0000 mg | DELAYED_RELEASE_TABLET | Freq: Every day | ORAL | Status: DC
  Administered 2015-04-27 – 2015-04-28 (×2): 40 mg via ORAL
  Filled 2015-04-27 (×2): qty 1

## 2015-04-27 MED ORDER — DEXMEDETOMIDINE HCL IN NACL 200 MCG/50ML IV SOLN: 0.4000 ug/kg/h | INTRAVENOUS | Status: DC

## 2015-04-27 MED ORDER — PREDNISONE 20 MG PO TABS
50.0000 mg | ORAL_TABLET | Freq: Every day | ORAL | Status: DC
  Administered 2015-04-27 – 2015-04-28 (×2): 50 mg via ORAL
  Filled 2015-04-27 (×2): qty 2

## 2015-04-27 MED ORDER — LACTULOSE 10 GM/15ML PO SOLN
20.0000 g | Freq: Two times a day (BID) | ORAL | Status: DC
  Filled 2015-04-27: qty 30

## 2015-04-27 MED ORDER — QUETIAPINE FUMARATE 50 MG PO TABS: 50.0000 mg | ORAL_TABLET | Freq: Two times a day (BID) | ORAL | Status: DC
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
## 2015-04-27 NOTE — Progress Notes (Signed)
Name: Jasmine [name] MRN: [id number] DOB: 02-28-1990    ADMISSION DATE:  04/19/2015 CONSULTATION DATE:  04/20/2015  REFERRING MD : Marzetta Board, M.D. / [facility]  CHIEF COMPLAINT:  Asthma Exacerbation  BRIEF PATIENT DESCRIPTION: 25 year old female with long history of asthma with prior intubations. Presented with asthma exacerbation for approximately 3 days.  SUBJECTIVE:  RN reports precedex turned off at 0800.  Pt remains tearful.  RN reports pt will turn in bed independently at times trying to get out of bed but when asked to do something she goes limp.  Afebrile.    VITAL SIGNS: Temp:  [97.6 F (36.4 C)-98.7 F (37.1 C)] 98.1 F (36.7 C) (04/12 0800) Pulse Rate:  [49-88] 65 (04/12 0600) Resp:  [15-28] 21 (04/12 0600) BP: (90-149)/(37-98) 139/89 mmHg (04/12 0600) SpO2:  [97 %-100 %] 100 % (04/12 [id number])  PHYSICAL EXAMINATION: General:  Young adult female in NAD Integument:  Warm & dry. No rash on exposed skin. Lymphatics:  No appreciated cervical or supraclavicular lymphadenoapthy. HEENT:  MM pink/moist, no jvd.  No scleral injection or icterus.  Cardiovascular:  s1s2 rrr, No edema. No appreciable JVD.  Pulmonary:  Even/non-labored, lungs bilaterally coarse.  No wheezing.   Abdomen: Soft. Nondistended. Normal bowel sounds.  Neurological:  Awake, alert, MAE.   PSY:  Tearful, anxious .    Recent Labs Lab 04/25/15 0347 04/26/15 0313 04/27/15 0318  NA 144 142 142  K 3.4* 3.7 3.6  CL 109 107 107  CO2 27 26 26   BUN 12 16 14   CREATININE 0.65 0.64 0.44  GLUCOSE 134* 122* 107*    Recent Labs Lab 04/25/15 0502 04/26/15 0310 04/27/15 0318  HGB 11.8* 11.2* 12.3  HCT 36.3 34.0* 37.1  WBC 12.9* 19.6* 16.5*  PLT 196 215 219   Dg Chest Port 1 View  04/26/2015  CLINICAL DATA:  Respiratory failure. EXAM: PORTABLE CHEST 1 VIEW COMPARISON:  04/21/2015. FINDINGS: Endotracheal tube and NG tube in stable position. Low lung volumes with mild basilar atelectasis mild left base  infiltrate cannot be excluded. No pleural effusion pneumothorax. IMPRESSION: 1. Lines and tubes in stable position. 2. Low lung volumes with basilar subsegmental atelectasis again noted. Mild infiltrate in the left lung base cannot be excluded . Electronically Signed   By: Marcello Moores  Register   On: 04/26/2015 07:07   SIGNIFICANT EVENTS  4/04 - Admit 4/05 - Intubated for asthma exacerbation  4/11 - Extubated 4/12 - Tearful, begging to go home.  Precedex turned off at 0800  STUDIES:  CT ABD/PELVIS W/O 4/6 >> No intra-abdominal process in abdomen or pelvis. Cholelithiasis noted. Bartholin's gland cyst suspected on the left measuring 10 mm.  MICROBIOLOGY: Tracheal Asp Ctx 4/6:  Oral [place] Respiratory Viral Panel PCR Trach Asp 4/6:  Metapneumovirus  Respiratory Panel PCR NP Swab 4/5:  Metapneumovirus Influenza PCR 4/5:  Negative  Blood Ctx x2 4/4>>>ng MRSA PCR 4/5:  Negative HIV 4/4:  Negative   ANTIBIOTICS: Levaquin 4/4 >> 4/9  LINES/TUBES: OETT 7.5 4/5 >> 4/11 OGT 4/5 >> 4/11 FOLE 4/5 >> 4/12 PIV x2   ASSESSMENT / PLAN:   PULMONARY A: Acute Hypercarbic Respiratory Failure - Resolving Asthma Exacerbation - H/O Multiple intubations  CAP Possible VCD  P: Change prednisone to oral, 50 mg QD.  Consider removing as allergy from list as she has tolerated thus far.     Duoneb q4hr + Q1 PRN albuterol  Pulmonary hygiene - mobilize, IS  CARDIOLOGY A: HTN Prolonged QTc - Resolved. QTc 446  on 4/9 & 4/10 EKG.  P: Monitor on telemetry Hydralazine IV prn   NEPHROLOGY A: Hypokalemia - resolved  P: Trend UOP with Foley Monitor renal function & electrolytes daily Replacing electrolytes as indicated  GASTROENTEROLOGY A: Abdominal/Left Flank Pain - CT & labs negative H/O GERD  P: Begin clear liquid diet and advance as tolerated  Protonix PO   HEMATOLOGY/ONCOLOGY A: Leukocytosis - Likely reactive vs acute infection. Improving.  P: Trending cell counts daily w/  CBC SCDs Lovenox [place] daily  INFECTIOUS DISEASES A: Metapneumovirus Infection CAP  P: Reculture for new fever Completed 7 days levaquin Monitor off abx  NEUROLOGY A:   Possible Seizure  H/O Epilepsy Severe Anxiety  P: Seizure precautions ct ativan PO q8hr >> will need PSY recommendations for anxiety regimen  seroquel 50 bid Senna bid  Keppra bid   EEG unable to be completed as patient has a hair weave >> cancel vs remove weave.  No further seizure activity. PSY consulted, appreciate input    GLOBAL:  Patient anxious to return home- she has 3 children.  Tearful, PSY consulted.  Precedex turned off 4/12 0800.  Advance diet, mobilize etc.     Noe Gens, NP-C [facility] Pgr: [phone] or if no answer [phone] 04/27/2015, 9:22 AM

## 2015-04-27 NOTE — Progress Notes (Signed)
PT Cancellation Note  Patient Details Name: Jasmine [name] MRN: [id number] DOB: 08/28/1990   Cancelled Treatment:    Reason Eval/Treat Not Completed: Fatigue/lethargy limiting ability to participate (per RN, was up to [facility] and back to bed. will check back  tomorrow.)   Claretha Cooper 04/27/2015, 2:34 PM Tresa Endo PT [phone]

## 2015-04-27 NOTE — Progress Notes (Signed)
eLink Physician-Brief Progress Note Patient Name: Jasmine [name] DOB: [birth year] MRN: [id number]   Date of Service  04/27/2015  HPI/Events of Note  Precedex order expiring at 3:30 AM.   eICU Interventions  Will reorder Precedex IV infusion.      Intervention Category Minor Interventions: Agitation / anxiety - evaluation and management  Sommer,Steven Eugene 04/27/2015, 2:57 AM

## 2015-04-27 NOTE — Progress Notes (Signed)
Date:  April 27, 2015 Chart reviewed for concurrent status and case management needs. Will continue to follow patient for changes and needs: iv precedex drip, iv theophylline drip. Velva Harman, BSN, [place], [place]   [phone]

## 2015-04-27 NOTE — Discharge Summary (Signed)
Physician Discharge Summary  Patient ID: Jasmine [name] MRN: [id number] DOB/AGE: 1990/05/11 25 y.o.  Admit date: 04/19/2015 Discharge date: 04/28/2015    Discharge Diagnoses:  Acute Hypercarbic Respiratory Failure Acute Asthma Exacerbation  Metapneumovirus CAP  Possible VCD Hypertension  Prolonged QTc Hypokalemia Abdominal / Left Flank Pain   GERD Leukocytosis Possible Seizure Epilepsy Severe Anxiety                                                                      DISCHARGE PLAN BY DIAGNOSIS     Acute Hypercarbic Respiratory Failure - resolved  Acute Asthma Exacerbation - resolved, hx of multiple intubations Metapneumovirus - positive on admit CAP - resolved Possible VCD  Discharge Plan: Continue Symbicort at discharge  PRN albuterol inhaler / nebulized for discharge  Follow up with the community clinic as scheduled   Hypertension - resolved Prolonged QTc - resolved   Discharge Plan: Follow up blood pressure with community clinic   Hypokalemia - resolved  Abdominal / Left Flank Pain - resolved, CT abd/pelvis and labs all negative  GERD  Discharge Plan: Continue Protonix for acid reflux  Leukocytosis - resolved  Discharge Plan: No acute follow up at this time   Possible Seizure - unable to complete EEG due to hair weave, no further seizures Epilepsy Severe Anxiety - evaluated by PSY, recommended for zyprexa QHS  Discharge Plan: Continue Keppra  Follow up with PCP at discharge                  [place] is a 25 y.o. y/o female with a PMH of seizure disorder, sickle cell trait, pancreatitis, GERD, anxiety, and asthma with multiple exacerbations requiring intubation who presented to [facility] on 04/19/15 with complaints of dry cough and shortness of breath.      On admission, the patient reported she had a dry cough, shortness of breath and increased wheezing.  Initial ER evaluation, notable for WBC 8.1, tachycardia,  tachypnea and hypokalemia.  Initial CXR demonstrated changes concerning for changes consistent with bronchitis.  She was admitted by [facility] for management of acute asthma exacerbation.  She was treated with IV steroids, empiric antibiotics, and nebulized bronchodilators.  Viral panel assessed and positive for metapneumovirus.  All other cultures were negative.  The patient developed worsening respiratory distress on 4/5 and Pulmonary / Critical Care was consulted for evaluation.  She was placed on BiPAP for increased work of breathing.  The patient decompensated on the evening of 04/20/15 requiring intubation for respiratory distress.  It was suspected that she had a significant component of anxiety affecting her exacerbation.  ICU course prolonged due to mechanical ventilation needs and agitation.  She required precedex for sedation / agitation.  The patient complained of abdominal / flank pain while intubated.  She was evaluated with a CT of the abdomen / pelvis which was negative.  All labs were negative as well.  There was an incidental finding of a bartholin's cyst.  The patient was liberated from mechanical ventilation on 4/11 without difficulty. She remained anxious and tearful.  Due to significant anxiety, she was evaluated by psychiatry and recommendations were made for zyprexa QHS.  The patient was ambulated with physical therapy and progressed toward ambulation  with a walker (safety / pt comfort).  She was insistent upon discharge 4/13 as she has 3 children, youngest 13 months of age.  The patient set up with follow up appointment with the [facility] and home health physical therapy.  Discharge plans as above.               SIGNIFICANT EVENTS  4/04 - Admit 4/05 - Intubated for asthma exacerbation  4/11 - Extubated 4/12 - Tearful, begging to go home. Precedex turned off at 0800 4/13 - Ambulated multiple times, PT rec's walker, home health PT   STUDIES:  CT ABD/PELVIS W/O 4/6 >> No  intra-abdominal process in abdomen or pelvis. Cholelithiasis noted. Bartholin's gland cyst suspected on the left measuring 10 mm.  MICROBIOLOGY: Tracheal Asp Ctx 4/6: Oral [place]ora Respiratory Viral Panel PCR Trach Asp 4/6: Metapneumovirus  Respiratory Panel PCR NP Swab 4/5: Metapneumovirus Influenza PCR 4/5: Negative  Blood Ctx x2 4/4>>>ng MRSA PCR 4/5: Negative HIV 4/4: Negative   ANTIBIOTICS: Levaquin 4/4 >> 4/9  LINES/TUBES: OETT 7.5 4/5 >> 4/11 OGT 4/5 >> 4/11 FOLEY 4/5 >> 4/12 PIV x2    Discharge Exam: General: [place] adult female in NAD Integument: Warm & dry. No rash on exposed skin. HEENT: MM pink/moist, no jvd. No scleral injection or icterus.  Cardiovascular: s1s2 rrr, No edema. No appreciable JVD.  Pulmonary: Even/non-labored, lungs bilaterally coarse. No wheezing.  Abdomen: Soft. Nondistended. Normal bowel sounds.  Neurological: Awake, alert, MAE.  PSY: improved mood, anxious to go home  Filed Vitals:   04/28/15 0600 04/28/15 0700 04/28/15 0730 04/28/15 0800  BP: 105/61   132/71  Pulse: 95 106  100  Temp:   98.2 F (36.8 C)   TempSrc:   Oral   Resp: '21 22  24  '$ Height:      Weight:      SpO2: 100% 100%  99%     Discharge Labs  BMET  Recent Labs Lab 04/23/15 0623 04/24/15 0326 04/25/15 0347 04/26/15 0310 04/26/15 0313 04/27/15 0318  NA 141 140 144  --  142 142  K 3.2* 3.5 3.4*  --  3.7 3.6  CL 111 107 109  --  107 107  CO2 '23 23 27  '$ --  26 26  GLUCOSE 131* 111* 134*  --  122* 107*  BUN '12 12 12  '$ --  16 14  CREATININE 0.57 0.62 0.65  --  0.64 0.44  CALCIUM 8.1* 8.7* 8.7*  --  9.1 9.4  MG 1.9 1.9 1.9 2.1  --  2.0  PHOS 2.4* 2.6 3.3  --  3.7 3.2    CBC  Recent Labs Lab 04/25/15 0502 04/26/15 0310 04/27/15 0318  HGB 11.8* 11.2* 12.3  HCT 36.3 34.0* 37.1  WBC 12.9* 19.6* 16.5*  PLT 196 215 219      Medication List    STOP taking these medications        cyclobenzaprine 10 MG tablet  Commonly known as:   FLEXERIL     Fluticasone-Salmeterol 250-50 MCG/DOSE Aepb  Commonly known as:  ADVAIR DISKUS     oxyCODONE 5 MG immediate release tablet  Commonly known as:  Oxy IR/ROXICODONE     tiotropium 18 MCG inhalation capsule  Commonly known as:  SPIRIVA      TAKE these medications        albuterol 108 (90 Base) MCG/ACT inhaler  Commonly known as:  PROVENTIL HFA;VENTOLIN HFA  Inhale 2 puffs into the lungs every 4 (four)  hours as needed for wheezing or shortness of breath.     albuterol (2.5 MG/3ML) 0.083% nebulizer solution  Commonly known as:  PROVENTIL  Take 3 mLs (2.5 mg total) by nebulization every 4 (four) hours as needed for wheezing or shortness of breath.     budesonide-formoterol 160-4.5 MCG/ACT inhaler  Commonly known as:  SYMBICORT  Inhale 2 puffs into the lungs 2 (two) times daily.     EPINEPHrine 1 MG/ML injection  Commonly known as:  ADRENALIN  Inject 1 mL (1 mg total) into the muscle once.     levETIRAcetam 500 MG tablet  Commonly known as:  KEPPRA  Take 1 tablet (500 mg total) by mouth 2 (two) times daily.     montelukast 10 MG tablet  Commonly known as:  SINGULAIR  Take 1 tablet (10 mg total) by mouth at bedtime.     OLANZapine 5 MG tablet  Commonly known as:  ZYPREXA  Take 1 tablet (5 mg total) by mouth at bedtime.     pantoprazole 40 MG tablet  Commonly known as:  PROTONIX  Take 1 tablet (40 mg total) by mouth daily.     predniSONE 20 MG tablet  Commonly known as:  DELTASONE  2 tabs for 2 days, then 1 1/2 tab for 2 days, 1 tab for 2 days, then 1/2 tab for 2 days     Prenatal Vitamins 0.8 MG tablet  Take 1 tablet by mouth daily.        Follow-up Information    Follow up with Maren Reamer, MD. Call on 05/10/2015.   Specialty:  Internal Medicine   Why:  Appt at 9:30 AM    Contact information:   [place] [address] [phone]         Disposition:  Home, home health PT arranged.  Follow up appointment scheduled for  patient.     Discharged Condition: Jasmine [name] has met maximum benefit of inpatient care and is medically stable and cleared for discharge.  Patient is pending follow up as above.      Time spent on disposition:  Greater than 35 minutes.   Signed: Noe Gens, NP-C [facility] Pgr: [phone] Office: [phone]

## 2015-04-28 LAB — GLUCOSE, CAPILLARY
GLUCOSE-CAPILLARY: 79 mg/dL (ref 65–99)
GLUCOSE-CAPILLARY: 82 mg/dL (ref 65–99)
GLUCOSE-CAPILLARY: 91 mg/dL (ref 65–99)

## 2015-04-28 MED ORDER — PANTOPRAZOLE SODIUM 40 MG PO TBEC: 40.0000 mg | DELAYED_RELEASE_TABLET | Freq: Every day | ORAL | Status: DC

## 2015-04-28 MED ORDER — BUDESONIDE-FORMOTEROL FUMARATE 160-4.5 MCG/ACT IN AERO: 2.0000 | INHALATION_SPRAY | Freq: Two times a day (BID) | RESPIRATORY_TRACT | Status: DC

## 2015-04-28 MED ORDER — PREDNISONE 20 MG PO TABS: ORAL_TABLET | ORAL | Status: DC

## 2015-04-28 MED ORDER — ALBUTEROL SULFATE HFA 108 (90 BASE) MCG/ACT IN AERS: 2.0000 | INHALATION_SPRAY | RESPIRATORY_TRACT | Status: DC | PRN

## 2015-04-28 MED ORDER — ZOLPIDEM TARTRATE 5 MG PO TABS
5.0000 mg | ORAL_TABLET | Freq: Once | ORAL | Status: AC
  Administered 2015-04-28: 5 mg via ORAL
  Filled 2015-04-28: qty 1

## 2015-04-28 MED ORDER — LEVETIRACETAM 500 MG PO TABS: 500.0000 mg | ORAL_TABLET | Freq: Two times a day (BID) | ORAL | Status: DC

## 2015-04-28 MED ORDER — LEVETIRACETAM 500 MG PO TABS
500.0000 mg | ORAL_TABLET | Freq: Two times a day (BID) | ORAL | Status: DC
  Administered 2015-04-28: 500 mg via ORAL
  Filled 2015-04-28: qty 1

## 2015-04-28 MED ORDER — OLANZAPINE 5 MG PO TABS: 5.0000 mg | ORAL_TABLET | Freq: Every day | ORAL | Status: DC

## 2015-04-28 MED ORDER — ALBUTEROL SULFATE (2.5 MG/3ML) 0.083% IN NEBU: 2.5000 mg | INHALATION_SOLUTION | RESPIRATORY_TRACT | Status: DC | PRN

## 2015-04-28 NOTE — Progress Notes (Signed)
[facility]    [facility] is providing the following services: RW  If patient discharges after hours, please call [phone].   Linward Headland 04/28/2015, 11:35 AM

## 2015-04-28 NOTE — Evaluation (Signed)
Physical Therapy Evaluation Patient Details Name: Jasmine [name] MRN: [id number] DOB: [birth year] Today's Date: 04/28/2015   History of Present Illness  25 year old female with long history of asthma with prior intubations. Presented with asthma exacerbation 04/19/15, INTUBATED WITH EXTUBATION 4/11.  Clinical Impression  The patient presents with significant weakness of the legs , more than the  UE's. Patient  Has decreased control of the LE's and  Is limited in ambulation.. Patient also crying with c.o pain and  "tingling/pins and needles" of the legs and feet. Patient has decreased ROM with dorsiflexion(Increased pain to stretch). HR up into 150's during minimal mobility today. RN aware. Pt admitted with above diagnosis. Pt currently with functional limitations due to the deficits listed below (see PT Problem List).  Pt will benefit from skilled PT to increase their independence and safety with mobility to allow discharge to the venue listed below.       Follow up recommendations Home health PT;Supervision/Assistance - 24 hour? CIR consult for rehab. Depending on progress    Equipment Recommendations  Wheelchair (measurements PT);Rolling walker with 5" wheels (depends on function at DC.)    Recommendations for Other Services   OT    Precautions / Restrictions Precautions Precautions: Fall Precaution Comments: monitor HR Restrictions Weight Bearing Restrictions: Yes LUE Weight Bearing: Weight bearing as tolerated      Mobility  Bed Mobility Overal bed mobility: Needs Assistance Bed Mobility: Supine to Sit     Supine to sit: Mod assist     General bed mobility comments: assist with the legs and trunk, extra time to mobilize  Transfers Overall transfer level: Needs assistance Equipment used: Rolling walker (2 wheeled) Transfers: Sit to/from [facility] Sit to Stand: Max assist;+2 physical assistance;From elevated surface;+2 safety/equipment Stand pivot  transfers: Max assist;+2 physical assistance;+2 safety/equipment       General transfer comment: decreased trunk stability, legs are  "rubber-like" with decreased control of legs, feet cross, swings past front of the RW. Left knee hyperextends during stance. VERY POOR control of trunk and legs.  Ambulation/Gait Ambulation/Gait assistance: Max assist;+2 physical assistance;+2 safety/equipment Ambulation Distance (Feet): 8 Feet Assistive device: Rolling walker (2 wheeled) Gait Pattern/deviations: Step-to pattern;Decreased step length - right;Decreased step length - left;Staggering left;Staggering right;Trunk flexed;Steppage;Scissoring;Shuffle     General Gait Details: Demonstrates poor standing , trunk swaying, leans on RW heavily. Patient demonstrates poor control of legs with ambulation, scissoring(Left greater than Right). C/O pain  and tingling in the calves with standing. Knees are buckling at times. Assisted to sit down due to poor ability to stand.  Stairs            Wheelchair Mobility    Modified Rankin (Stroke Patients Only)       Balance Overall balance assessment: Needs assistance Sitting-balance support: Bilateral upper extremity supported;Feet supported Sitting balance-Leahy Scale: Poor     Standing balance support: During functional activity;Bilateral upper extremity supported Standing balance-Leahy Scale: Poor Standing balance comment: trunk is swaying, legs are unstable to stand static.                             Pertinent Vitals/Pain Pain Assessment: 0-10 Faces Pain Scale: Hurts whole lot Pain Location: both legs and feet, Pain Descriptors / Indicators: Tingling;Cramping;Discomfort;Grimacing;Crying;Heaviness;Moaning Pain Intervention(s): Limited activity within patient's tolerance;Monitored during session;Patient requesting pain meds-RN notified    Home Living Family/patient expects to be discharged to:: Private residence Living  Arrangements: Parent;Children Available Help at Discharge:  Family;Available 24 hours/day Type of Home: Apartment Home Access: Stairs to enter Entrance Stairs-Rails: Psychiatric nurse of Steps: 4-rail in center Home Layout: One level Home Equipment: None      Prior Function Level of Independence: Independent               Hand Dominance        Extremity/Trunk Assessment   Upper Extremity Assessment: RUE deficits/detail;LUE deficits/detail RUE Deficits / Details: antigravity shoulders     LUE Deficits / Details: same   Lower Extremity Assessment: RLE deficits/detail;LLE deficits/detail RLE Deficits / Details: dorsiflexion2+/5, decreased range to neutral with pain to dorsiflex to neutral and past, knee extension 3+, hip flexion 3, reports "tingling  /pins and needles" LLE Deficits / Details: dosiflexion2/5, knee extension 3+/5, hip flexion 3, reports "tingling/pins and needles", pain to stretch dorsiflexion  Cervical / Trunk Assessment: Other exceptions  Communication   Communication:  (voice is very soft,decreased effort)  Cognition Arousal/Alertness: Awake/alert Behavior During Therapy: Restless;Flat affect Overall Cognitive Status: Within Functional Limits for tasks assessed                      General Comments      Exercises General Exercises - Lower Extremity Ankle Circles/Pumps: PROM;AAROM;Both;5 reps;Supine Heel Slides: AAROM;Both;5 reps;Supine      Assessment/Plan    PT Assessment Patient needs continued PT services  PT Diagnosis Difficulty walking;Abnormality of gait;Generalized weakness;Acute pain   PT Problem List Decreased strength;Decreased range of motion;Decreased activity tolerance;Decreased balance;Decreased mobility;Decreased coordination;Impaired sensation;Cardiopulmonary status limiting activity;Decreased knowledge of precautions;Decreased safety awareness;Decreased knowledge of use of DME;Pain  PT Treatment  Interventions DME instruction;Gait training;Stair training;Functional mobility training;Therapeutic activities;Therapeutic exercise;Balance training;Neuromuscular re-education;Patient/family education   PT Goals (Current goals can be found in the Care Plan section) Acute Rehab PT Goals Patient Stated Goal: I want to go home PT Goal Formulation: With patient Time For Goal Achievement: 05/12/15 Potential to Achieve Goals: Good    Frequency Min 3X/week   Barriers to discharge Inaccessible home environment;Decreased caregiver support      Co-evaluation               End of Session Equipment Utilized During Treatment: Gait belt Activity Tolerance: Patient limited by pain;Patient limited by fatigue;Treatment limited secondary to medical complications (Comment) Patient left: in chair;with call bell/phone within reach;with chair alarm set Nurse Communication: Mobility status         Time: [id number] PT Time Calculation (min) (ACUTE ONLY): 34 min   Charges:   PT Evaluation $PT Eval Moderate Complexity: 1 Procedure PT Treatments $Gait Training: 8-22 mins   PT G Codes:        Claretha Cooper 04/28/2015, 9:17 AM Tresa Endo PT [phone]

## 2015-04-28 NOTE — Progress Notes (Signed)
Physical Therapy Treatment Patient Details Name: Jasmine [name] MRN: [id number] DOB: 10/21/1990 Today's Date: 04/28/2015    History of Present Illness 25 year old female with long history of asthma with prior intubations. Presented with asthma exacerbation 04/19/15, INTUBATED WITH EXTUBATION 4/11.    PT Comments    The patient is now much improved in ambulation, still wobbly legs. Needs RW. HHPT. Plans DC today.  Follow Up Recommendations  Home health PT;Supervision/Assistance - 24 hour     Equipment Recommendations  Rolling walker with 5" wheels    Recommendations for Other Services       Precautions / Restrictions Precautions Precautions: Fall Precaution Comments: monitor HR Restrictions LUE Weight Bearing: Weight bearing as tolerated    Mobility  Bed Mobility Overal bed mobility: Needs Assistance Bed Mobility: Supine to Sit     Supine to sit: Mod assist     General bed mobility comments: assist with the legs and trunk, extra time to mobilize  Transfers Overall transfer level: Needs assistance Equipment used: Rolling walker (2 wheeled) Transfers: Sit to/from Stand Sit to Stand: Min guard Stand pivot transfers: Max assist;+2 physical assistance;+2 safety/equipment       General transfer comment: much stronger sitting ststid, leans forward  Ambulation/Gait Ambulation/Gait assistance: Min assist Ambulation Distance (Feet): 180 Feet Assistive device: Rolling walker (2 wheeled);None Gait Pattern/deviations: Step-to pattern;Step-through pattern;Staggering right;Staggering left;Shuffle     General Gait Details: much improved in mobility this visit. gait is unsteady, does better with a RW. Ambulated x 25' without and lost balance. Also lost balance  whenturning with RW.    Stairs            Wheelchair Mobility    Modified Rankin (Stroke Patients Only)       Balance Overall balance assessment: Needs assistance Sitting-balance support: Feet  supported Sitting balance-Leahy Scale: Fair     Standing balance support: During functional activity;No upper extremity supported Standing balance-Leahy Scale: Poor Standing balance comment: trunk is swaying, legs are unstable to stand static.                    Cognition Arousal/Alertness: Awake/alert Behavior During Therapy: Restless;Flat affect Overall Cognitive Status: Within Functional Limits for tasks assessed                      Exercises General Exercises - Lower Extremity Ankle Circles/Pumps: PROM;AAROM;Both;5 reps;Supine Heel Slides: AAROM;Both;5 reps;Supine    General Comments        Pertinent Vitals/Pain Pain Assessment: 0-10 Faces Pain Scale: Hurts little more Pain Location: both legs when stretching the hamstrings Pain Descriptors / Indicators: Tingling;Tightness Pain Intervention(s): Limited activity within patient's tolerance;Monitored during session;Patient requesting pain meds-RN notified    Home Living Family/patient expects to be discharged to:: Private residence Living Arrangements: Parent;Children Available Help at Discharge: Family;Available 24 hours/day Type of Home: Apartment Home Access: Stairs to enter Entrance Stairs-Rails: Right;Left Home Layout: One level Home Equipment: None      Prior Function Level of Independence: Independent          PT Goals (current goals can now be found in the care plan section) Acute Rehab PT Goals Patient Stated Goal: I want to go home PT Goal Formulation: With patient Time For Goal Achievement: 05/12/15 Potential to Achieve Goals: Good Progress towards PT goals: Progressing toward goals    Frequency  Min 3X/week    PT Plan Current plan remains appropriate    Co-evaluation  End of Session Equipment Utilized During Treatment: Gait belt Activity Tolerance: Patient tolerated treatment well Patient left: in bed;with nursing/sitter in room     Time: 1100-1111 PT  Time Calculation (min) (ACUTE ONLY): 11 min  Charges:  $Gait Training: 8-22 mins                    G Codes:      Claretha Cooper 04/28/2015, 11:25 AM

## 2015-04-28 NOTE — Progress Notes (Signed)
[place] Progress Note Patient Name: Jasmine [name] DOB: September 11, 1990 MRN: [id number]   Date of Service  04/28/2015  HPI/Events of Note  Patient requests sleep aide.   eICU Interventions  Will order Ambien 5 mg PO X 1 now.      Intervention Category Intermediate Interventions: Other:  Josey Dettmann Cornelia Copa 04/28/2015, 2:01 AM

## 2015-05-10 ENCOUNTER — Inpatient Hospital Stay: Payer: Self-pay | Admitting: Internal Medicine

## 2015-08-03 ENCOUNTER — Emergency Department (HOSPITAL_COMMUNITY)
Admission: EM | Admit: 2015-08-03 | Discharge: 2015-08-03 | Discharge status: Against Medical Advice | Payer: Medicaid Other | Attending: Emergency Medicine | Admitting: Emergency Medicine

## 2015-08-03 ENCOUNTER — Encounter (HOSPITAL_COMMUNITY): Payer: Self-pay

## 2015-08-03 ENCOUNTER — Ambulatory Visit (HOSPITAL_COMMUNITY): Payer: Self-pay

## 2015-08-03 DIAGNOSIS — Z87891 Personal history of nicotine dependence: Secondary | ICD-10-CM | POA: Diagnosis not present

## 2015-08-03 DIAGNOSIS — J45909 Unspecified asthma, uncomplicated: Secondary | ICD-10-CM | POA: Diagnosis not present

## 2015-08-03 DIAGNOSIS — Z7951 Long term (current) use of inhaled steroids: Secondary | ICD-10-CM | POA: Insufficient documentation

## 2015-08-03 DIAGNOSIS — R569 Unspecified convulsions: Secondary | ICD-10-CM

## 2015-08-03 DIAGNOSIS — G40909 Epilepsy, unspecified, not intractable, without status epilepticus: Secondary | ICD-10-CM | POA: Diagnosis not present

## 2015-08-03 LAB — COMPREHENSIVE METABOLIC PANEL
ALBUMIN: 4.3 g/dL (ref 3.5–5.0)
ALT: 23 U/L (ref 14–54)
ANION GAP: 10 (ref 5–15)
AST: 27 U/L (ref 15–41)
Alkaline Phosphatase: 49 U/L (ref 38–126)
BUN: 7 mg/dL (ref 6–20)
CO2: 23 mmol/L (ref 22–32)
Calcium: 8.9 mg/dL (ref 8.9–10.3)
Chloride: 107 mmol/L (ref 101–111)
Creatinine, Ser: 0.84 mg/dL (ref 0.44–1.00)
GFR calc Af Amer: >60 mL/min (ref >60)
GFR calc non Af Amer: >60 mL/min (ref >60)
GLUCOSE: 90 mg/dL (ref 65–99)
POTASSIUM: 2.8 mmol/L — AB (ref 3.5–5.1)
SODIUM: 140 mmol/L (ref 135–145)
Total Bilirubin: 0.7 mg/dL (ref 0.3–1.2)
Total Protein: 7 g/dL (ref 6.5–8.1)

## 2015-08-03 LAB — CBC WITH DIFFERENTIAL/PLATELET
BASOS PCT: 0 %
Basophils Absolute: 0 10*3/uL (ref 0.0–0.1)
EOS ABS: 0.1 10*3/uL (ref 0.0–0.7)
Eosinophils Relative: 1 %
HEMATOCRIT: 36.3 % (ref 36.0–46.0)
HEMOGLOBIN: 12.1 g/dL (ref 12.0–15.0)
LYMPHS ABS: 1.6 10*3/uL (ref 0.7–4.0)
Lymphocytes Relative: 14 %
MCH: 28.2 pg (ref 26.0–34.0)
MCHC: 33.3 g/dL (ref 30.0–36.0)
MCV: 84.6 fL (ref 78.0–100.0)
Monocytes Absolute: 0.5 10*3/uL (ref 0.1–1.0)
Monocytes Relative: 5 %
Neutro Abs: 9.3 10*3/uL — ABNORMAL HIGH (ref 1.7–7.7)
Neutrophils Relative %: 80 %
Platelets: 232 10*3/uL (ref 150–400)
RBC: 4.29 MIL/uL (ref 3.87–5.11)
RDW: 14.8 % (ref 11.5–15.5)
WBC: 11.6 10*3/uL — ABNORMAL HIGH (ref 4.0–10.5)

## 2015-08-03 LAB — I-STAT BETA HCG BLOOD, ED (MC, WL, AP ONLY)

## 2015-08-03 LAB — ETHANOL

## 2015-08-03 NOTE — ED Notes (Signed)
After much, lengthy counseling by Dr. Tomi Bamberger in the presence of pt's. Mother; she decides to leave AMA.  She absolutely refuses to allow [place] to remove her EMS c-collar, even though Dr. Tomi Bamberger strenuously advises her to do so.

## 2015-08-03 NOTE — ED Provider Notes (Addendum)
CSN: [id number]     Arrival date & time 08/03/15  1615 History   First MD Initiated Contact with Patient 08/03/15 1627     Chief Complaint  Patient presents with  . Seizures   Patient is a 25 y.o. female presenting with seizures. The history is provided by the patient and the EMS personnel.  Seizures Seizure activity on arrival: no   Seizure type: Friends apparently called EMS because of seizure like activity.  No further details available. Preceding symptoms comment:  Pt does not recall any aura.  She remembers driving to her sisters house.  The next thing she remembers is EMS arriving Postictal symptoms: confusion and somnolence   Number of seizures this episode:  1 Context comment:  Denies any recent illness.  Pt is now complaining of a headache and neck pain.  She presented to the ED in a collar and backboard.  Past Medical History  Diagnosis Date  . Asthma   . Sickle cell trait ([place])   . Pancreatitis   . GERD (gastroesophageal reflux disease)   . 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 Heart murmur     last work up age 64- no symtoms   Past Surgical History  Procedure Laterality Date  . Nerve, tendon and artery repair Right 09/23/2012    Procedure: I&D and Repair As Necessary/Right Hand and Palm;  Surgeon: Roseanne Kaufman, MD;  Location: [facility];  Service: Orthopedics;  Laterality: Right;  . Tonsillectomy    . Hernia repair    . Cesarean section      x 1  . Hand surgery    . Appendectomy    . Cesarean section N/A 11/08/2014    Procedure: CESAREAN SECTION;  Surgeon: Truett Mainland, DO;  Location: [facility];  Service: Obstetrics;  Laterality: N/A;   Family History  Problem Relation Age of Onset  . Cancer Mother     cervical cancer  . Asthma Mother   . Hypertension Father   . Sickle cell anemia Father   . Asthma Sister   . Diabetes Maternal Aunt   . Cancer Maternal Grandmother    Social History  Substance Use Topics  . Smoking status: Former Smoker    Types: Cigarettes     Quit date: 03/27/2014  . Smokeless tobacco: Never Used  . Alcohol Use: No   OB History    Gravida Para Term Preterm AB TAB SAB Ectopic Multiple Living   3 3 3       0 3     Review of Systems  Neurological: Positive for seizures.  All other systems reviewed and are negative.     Allergies  Avocado; Cheese; Chocolate; Ibuprofen; Orange juice; Other; Peach; Peanuts; Pear; Prednisone; Raspberry; Tylenol; Amoxicillin; Doxycycline; Erythromycin; Ivp dye; Latex; Milk of magnesia; and Penicillins  Home Medications   Prior to Admission medications   Medication Sig Start Date End Date Taking? Authorizing Provider  albuterol (PROVENTIL HFA;VENTOLIN HFA) 108 (90 Base) MCG/ACT inhaler Inhale 2 puffs into the lungs every 4 (four) hours as needed for wheezing or shortness of breath. 04/28/15  Yes Donita Brooks, NP  albuterol (PROVENTIL) (2.5 MG/3ML) 0.083% nebulizer solution Take 3 mLs (2.5 mg total) by nebulization every 4 (four) hours as needed for wheezing or shortness of breath. 04/28/15  Yes Donita Brooks, NP  budesonide-formoterol (SYMBICORT) 160-4.5 MCG/ACT inhaler Inhale 2 puffs into the lungs 2 (two) times daily. 04/28/15  Yes Donita Brooks, NP  EPINEPHrine (ADRENALIN) 1  MG/ML injection Inject 1 mL (1 mg total) into the muscle once. 06/10/14  Yes Nila Nephew, MD  levETIRAcetam (KEPPRA) 500 MG tablet Take 1 tablet (500 mg total) by mouth 2 (two) times daily. 04/28/15  Yes Donita Brooks, NP  montelukast (SINGULAIR) 10 MG tablet Take 1 tablet (10 mg total) by mouth at bedtime. 07/06/14  Yes Shanker Kristeen Mans, MD  Multiple Vitamin (MULTIVITAMIN WITH MINERALS) TABS tablet Take 1 tablet by mouth daily.   Yes Historical Provider, MD  OLANZapine (ZYPREXA) 5 MG tablet Take 1 tablet (5 mg total) by mouth at bedtime. 04/28/15  Yes Donita Brooks, NP  pantoprazole (PROTONIX) 40 MG tablet Take 1 tablet (40 mg total) by mouth daily. Patient not taking: Reported on 08/03/2015 04/28/15   Donita Brooks, NP   predniSONE (DELTASONE) 20 MG tablet 2 tabs for 2 days, then 1 1/2 tab for 2 days, 1 tab for 2 days, then 1/2 tab for 2 days Patient not taking: Reported on 08/03/2015 04/28/15   Donita Brooks, NP  Prenatal Multivit-Min-Fe-FA (PRENATAL VITAMINS) 0.8 MG tablet Take 1 tablet by mouth daily. Patient not taking: Reported on 01/03/2015 06/10/14   Nila Nephew, MD   BP 106/92 mmHg  Pulse 98  Temp(Src) 99.2 F (37.3 C) (Oral)  Resp 24  SpO2 100%  LMP 07/16/2015 (Approximate) Physical Exam  Constitutional: She appears well-developed and well-nourished. No distress.  HENT:  Head: Normocephalic and atraumatic.  Right Ear: External ear normal.  Left Ear: External ear normal.  Eyes: Conjunctivae are normal. Right eye exhibits no discharge. Left eye exhibits no discharge. No scleral icterus.  Neck: Neck supple. No tracheal deviation present.  Cardiovascular: Normal rate, regular rhythm and intact distal pulses.   Pulmonary/Chest: Effort normal and breath sounds normal. No stridor. No respiratory distress. She has no wheezes. She has no rales.  Abdominal: Soft. Bowel sounds are normal. She exhibits no distension. There is no tenderness. There is no rebound and no guarding.  Musculoskeletal: She exhibits no edema.       Cervical back: She exhibits tenderness and bony tenderness. She exhibits no swelling, no edema and no deformity.  Neurological: She is alert. She has normal strength. No cranial nerve deficit (no facial droop, extraocular movements intact, no slurred speech) or sensory deficit. She exhibits normal muscle tone. She displays no seizure activity. Coordination normal.  Skin: Skin is warm and dry. No rash noted.  Psychiatric: She has a normal mood and affect.  Nursing note and vitals reviewed.   ED Course  Procedures (including critical care time) Labs Review Labs Reviewed  CBC WITH DIFFERENTIAL/PLATELET - Abnormal; Notable for the following:    WBC 11.6 (*)    Neutro Abs 9.3 (*)     All other components within normal limits  ETHANOL  URINE RAPID DRUG SCREEN, HOSP PERFORMED  COMPREHENSIVE METABOLIC PANEL  I-STAT BETA HCG BLOOD, ED ([facility], [facility], [facility] ONLY)     MDM   Final diagnoses:  Seizure ([place])    1700  Pt informed us she was ready to leave and wanted to go home.  I explained to the patient that I was concerned about her neck pain.  I felt she needed to have that evaluated.  I explained that undiagnosed and improperly treated cervical spine fracture can lead to paralysis.  Pt states she understands this and still wants to leave.  Family is with her and are taking her home.  They did not try to convince her to  be evaluated.  She is still wearing the C collar.  I explained to her that this is not an appropriate long term collar and if her neck is still bothering her even more reason to let us check this out for her.  Pt's ethanol and CMET were not resulted prior to her leaving AMA    Dorie Rank, MD 08/03/15 1725

## 2015-08-03 NOTE — ED Notes (Signed)
Bed: WA17 Expected date:  Expected time:  Means of arrival:  Comments: EMS-SZ

## 2015-08-03 NOTE — ED Notes (Signed)
She was seen by friends to have seizure-like activity and they phoned EMS.  She arrives here drowsy and in no distress.  She is able to follow commands with all extremities with ease.  She tells Dr. Tomi Bamberger, who met her on arrival, that she recalls going to her sister's house; the next thing she knew she was in an ambulance.

## 2015-08-07 ENCOUNTER — Encounter (HOSPITAL_COMMUNITY): Payer: Self-pay

## 2015-08-07 ENCOUNTER — Emergency Department (HOSPITAL_COMMUNITY)
Admission: EM | Admit: 2015-08-07 | Discharge: 2015-08-07 | Disposition: A | Discharge status: Home / Self Care | Payer: Medicaid Other | Attending: Emergency Medicine | Admitting: Emergency Medicine

## 2015-08-07 DIAGNOSIS — Z91010 Allergy to peanuts: Secondary | ICD-10-CM | POA: Insufficient documentation

## 2015-08-07 DIAGNOSIS — J45901 Unspecified asthma with (acute) exacerbation: Secondary | ICD-10-CM | POA: Diagnosis not present

## 2015-08-07 DIAGNOSIS — M545 Low back pain, unspecified: Secondary | ICD-10-CM

## 2015-08-07 DIAGNOSIS — Z91040 Latex allergy status: Secondary | ICD-10-CM | POA: Diagnosis not present

## 2015-08-07 DIAGNOSIS — Z87891 Personal history of nicotine dependence: Secondary | ICD-10-CM | POA: Diagnosis not present

## 2015-08-07 MED ORDER — CYCLOBENZAPRINE HCL 10 MG PO TABS: 10.0000 mg | ORAL_TABLET | Freq: Three times a day (TID) | ORAL | 0 refills | Status: DC | PRN

## 2015-08-07 MED ORDER — CYCLOBENZAPRINE HCL 10 MG PO TABS
10.0000 mg | ORAL_TABLET | Freq: Once | ORAL | Status: AC
  Administered 2015-08-07: 10 mg via ORAL
  Filled 2015-08-07: qty 1

## 2015-08-07 NOTE — Discharge Instructions (Signed)
Read the information below.  Use the prescribed medication as directed.  Please discuss all new medications with your pharmacist.  You may return to the Emergency Department at any time for worsening condition or any new symptoms that concern you.    If you develop fevers, loss of control of bowel or bladder, weakness or numbness in your legs, or are unable to walk, return to the ER for a recheck.  °

## 2015-08-07 NOTE — ED Provider Notes (Signed)
[facility] DEPT Provider Note  First Provider Contact:  1:38 PM  By signing my name below, I, Bea Graff, attest that this documentation has been prepared under the direction and in the presence of [facility], PA-C. Electronically Signed: Bea Graff, ED Scribe. 08/07/15. 1:48 PM.  History   Chief Complaint Chief Complaint  Patient presents with  . Back Pain   The history is provided by the patient and medical records. No language interpreter was used.    HPI Comments:  Jasmine [name] is a 25 y.o. female who presents to the Emergency Department complaining of lower right sided back pain that began 5 days ago after having a seizure. She states she was at home standing up and bending over when the seizure began. Her mother states she fell forward with enough pressure to put one of hands through the wall. She has taken Tylenol with no significant relief of the pain. She has applied heat therapy without relief. Coughing, movement and walking increase the pain. She denies alleviating factors. She denies nausea, vomiting, abdominal pain, cough, fever, chills, CP, SOB, bowel or bladder incontinence, numbness, tingling or weakness of the lower extremities, neck pain, bruising, wounds, dysuria, hematuria, urgency or frequency, hematochezia. She does have a PCP at [facility]. She denies h/o kidney problems. She reports she is currently on Nexplanon birth control.  Past Medical History:  Diagnosis Date  . Asthma   . GERD (gastroesophageal reflux disease)   . Heart murmur    last work up age 52- no symtoms  . Pancreatitis   . Seizures ([place])    epilepsy  . Sickle cell trait [facility])     Patient Active Problem List   Diagnosis Date Noted  . Adjustment disorder with mixed anxiety and depressed mood 04/26/2015  . Hypokalemia 04/19/2015  . Asthma with acute exacerbation 04/19/2015  . Asthma exacerbation 01/03/2015  . Influenza-like illness 01/03/2015  . Status post  cesarean section 11/08/2014  . Previous cesarean section complicating pregnancy, antepartum condition or complication   . GERD (gastroesophageal reflux disease)   . Gastroesophageal reflux disease without esophagitis   . Abnormal biochemical finding on antenatal screening of mother   . Abnormal quad screen   . Echogenic focus of heart of fetus affecting antepartum care of mother   . Drug use affecting pregnancy, antepartum 05/19/2014  . Seizure disorder during pregnancy, antepartum ([place]) 05/13/2014  . Supervision of high risk pregnancy, antepartum 05/13/2014  . Asthma complicating pregnancy, antepartum 05/13/2014  . History of food anaphylaxis 05/13/2014  . Seizure disorder, sept 2015 last seizure 01/19/2013    Past Surgical History:  Procedure Laterality Date  . APPENDECTOMY    . CESAREAN SECTION     x 1  . CESAREAN SECTION N/A 11/08/2014   Procedure: CESAREAN SECTION;  Surgeon: Truett Mainland, DO;  Location: [facility];  Service: Obstetrics;  Laterality: N/A;  . HAND SURGERY    . HERNIA REPAIR    . NERVE, TENDON AND ARTERY REPAIR Right 09/23/2012   Procedure: I&D and Repair As Necessary/Right Hand and Palm;  Surgeon: Roseanne Kaufman, MD;  Location: [facility];  Service: Orthopedics;  Laterality: Right;  . TONSILLECTOMY      OB History    Gravida Para Term Preterm AB Living   3 3 3     3    SAB TAB Ectopic Multiple Live Births         0         Home Medications  Prior to Admission medications   Medication Sig Start Date End Date Taking? Authorizing Provider  albuterol (PROVENTIL HFA;VENTOLIN HFA) 108 (90 Base) MCG/ACT inhaler Inhale 2 puffs into the lungs every 4 (four) hours as needed for wheezing or shortness of breath. 04/28/15   Donita Brooks, NP  albuterol (PROVENTIL) (2.5 MG/3ML) 0.083% nebulizer solution Take 3 mLs (2.5 mg total) by nebulization every 4 (four) hours as needed for wheezing or shortness of breath. 04/28/15   Donita Brooks, NP  budesonide-formoterol (SYMBICORT)  160-4.5 MCG/ACT inhaler Inhale 2 puffs into the lungs 2 (two) times daily. 04/28/15   Donita Brooks, NP  cyclobenzaprine (FLEXERIL) 10 MG tablet Take 1 tablet (10 mg total) by mouth 3 (three) times daily as needed for muscle spasms (or pain). 08/07/15   Clayton Bibles, PA-C  EPINEPHrine (ADRENALIN) 1 MG/ML injection Inject 1 mL (1 mg total) into the muscle once. 06/10/14   Nila Nephew, MD  levETIRAcetam (KEPPRA) 500 MG tablet Take 1 tablet (500 mg total) by mouth 2 (two) times daily. 04/28/15   Donita Brooks, NP  montelukast (SINGULAIR) 10 MG tablet Take 1 tablet (10 mg total) by mouth at bedtime. 07/06/14   Shanker Kristeen Mans, MD  Multiple Vitamin (MULTIVITAMIN WITH MINERALS) TABS tablet Take 1 tablet by mouth daily.    Historical Provider, MD  OLANZapine (ZYPREXA) 5 MG tablet Take 1 tablet (5 mg total) by mouth at bedtime. 04/28/15   Donita Brooks, NP  pantoprazole (PROTONIX) 40 MG tablet Take 1 tablet (40 mg total) by mouth daily. Patient not taking: Reported on 08/03/2015 04/28/15   Donita Brooks, NP  predniSONE (DELTASONE) 20 MG tablet 2 tabs for 2 days, then 1 1/2 tab for 2 days, 1 tab for 2 days, then 1/2 tab for 2 days Patient not taking: Reported on 08/03/2015 04/28/15   Donita Brooks, NP  Prenatal Multivit-Min-Fe-FA (PRENATAL VITAMINS) 0.8 MG tablet Take 1 tablet by mouth daily. Patient not taking: Reported on 01/03/2015 06/10/14   Nila Nephew, MD    Family History Family History  Problem Relation Age of Onset  . Cancer Mother     cervical cancer  . Asthma Mother   . Hypertension Father   . Sickle cell anemia Father   . Asthma Sister   . Diabetes Maternal Aunt   . Cancer Maternal Grandmother     Social History Social History  Substance Use Topics  . Smoking status: Former Smoker    Types: Cigarettes    Quit date: 03/27/2014  . Smokeless tobacco: Never Used  . Alcohol use No     Allergies   Avocado; Cheese; Chocolate; Ibuprofen; Orange juice [orange oil]; Other; Peach [prunus  persica]; Peanuts [peanut oil]; Pear; Prednisone; Raspberry; Tylenol [acetaminophen]; Amoxicillin; Doxycycline; Erythromycin; Ivp dye [iodinated diagnostic agents]; Latex; Milk of magnesia [magnesium hydroxide]; and Penicillins   Review of Systems Review of Systems  Constitutional: Negative for chills and fever.  Respiratory: Negative for cough and shortness of breath.   Cardiovascular: Negative for chest pain.  Gastrointestinal: Negative for abdominal pain, blood in stool, nausea and vomiting.  Genitourinary: Negative for dysuria, frequency, hematuria and urgency.       No bowel or bladder incontinence  Musculoskeletal: Positive for back pain. Negative for neck pain.  Skin: Negative for color change and wound.  Allergic/Immunologic: Negative for immunocompromised state.  Neurological: Negative for weakness and numbness.  Hematological: Does not bruise/bleed easily.     Physical Exam Updated Vital Signs BP 115/74 (  BP Location: Right Arm)   Pulse 90   Temp 98.4 F (36.9 C)   Resp 18   LMP 07/16/2015 (Approximate)   SpO2 98%   Physical Exam  Constitutional: She appears well-developed and well-nourished. No distress.  HENT:  Head: Normocephalic and atraumatic.  Neck: Neck supple.  Pulmonary/Chest: Effort normal.  Abdominal: Soft. She exhibits no distension and no mass. There is no tenderness. There is no rebound and no guarding.  Musculoskeletal: She exhibits tenderness.  Spine nontender, no crepitus, or stepoffs. Lower extremities:  Strength 5/5, sensation intact, distal pulses intact. Tenderness of musculature of soft tissues of right thoracic and right lower back.  Neurological: She is alert.  Skin: She is not diaphoretic.  Nursing note and vitals reviewed.    ED Treatments / Results  Labs (all labs ordered are listed, but only abnormal results are displayed) Labs Reviewed - No data to display  EKG  EKG Interpretation None       Radiology No results  found.  Procedures Procedures (including critical care time)  Medications Ordered in ED Medications  cyclobenzaprine (FLEXERIL) tablet 10 mg (not administered)     Initial Impression / Assessment and Plan / ED Course  I have reviewed the triage vital signs and the nursing notes.  Pertinent labs & imaging results that were available during my care of the patient were reviewed by me and considered in my medical decision making (see chart for details).  Clinical Course  DIAGNOSTIC STUDIES: Oxygen Saturation is 98% on RA, normal by my interpretation.   COORDINATION OF CARE: 1:44 PM- Will prescribe muscle relaxer and give first dose prior to discharge. Pt verbalizes understanding and agrees to plan.  Medications  cyclobenzaprine (FLEXERIL) tablet 10 mg (not administered)     Afebrile, nontoxic patient with mechanical low back pain. No red flags. D/C home flexeril, PCP follow up. Discussed result, findings, treatment, and follow up  with patient.  Pt given return precautions.  Pt verbalizes understanding and agrees with plan.      I personally performed the services described in this documentation, which was scribed in my presence. The recorded information has been reviewed and is accurate.    Final Clinical Impressions(s) / ED Diagnoses   Final diagnoses:  Right-sided low back pain without sciatica    New Prescriptions New Prescriptions   CYCLOBENZAPRINE (FLEXERIL) 10 MG TABLET    Take 1 tablet (10 mg total) by mouth 3 (three) times daily as needed for muscle spasms (or pain).     Clayton Bibles, PA-C 08/07/15 [place], MD 08/07/15 [phone]

## 2015-08-07 NOTE — ED Triage Notes (Signed)
Patient here with ongoing right sided lumbar back pain for 4-5 days following seizure. Seen following event but pain with movement and change in position

## 2015-08-21 ENCOUNTER — Emergency Department (HOSPITAL_COMMUNITY)
Admission: EM | Admit: 2015-08-21 | Discharge: 2015-08-21 | Disposition: A | Discharge status: Home / Self Care | Payer: Medicaid Other | Attending: Emergency Medicine | Admitting: Emergency Medicine

## 2015-08-21 ENCOUNTER — Encounter (HOSPITAL_COMMUNITY): Payer: Self-pay | Admitting: *Deleted

## 2015-08-21 DIAGNOSIS — R110 Nausea: Secondary | ICD-10-CM

## 2015-08-21 DIAGNOSIS — Z87891 Personal history of nicotine dependence: Secondary | ICD-10-CM | POA: Insufficient documentation

## 2015-08-21 DIAGNOSIS — Z91010 Allergy to peanuts: Secondary | ICD-10-CM | POA: Insufficient documentation

## 2015-08-21 DIAGNOSIS — Z91040 Latex allergy status: Secondary | ICD-10-CM | POA: Diagnosis not present

## 2015-08-21 DIAGNOSIS — J45909 Unspecified asthma, uncomplicated: Secondary | ICD-10-CM | POA: Diagnosis not present

## 2015-08-21 DIAGNOSIS — R1013 Epigastric pain: Secondary | ICD-10-CM

## 2015-08-21 LAB — CBC WITH DIFFERENTIAL/PLATELET
BASOS PCT: 0 %
Basophils Absolute: 0 10*3/uL (ref 0.0–0.1)
EOS ABS: 0.3 10*3/uL (ref 0.0–0.7)
EOS PCT: 3 %
HCT: 38.2 % (ref 36.0–46.0)
HEMOGLOBIN: 12.3 g/dL (ref 12.0–15.0)
LYMPHS ABS: 3 10*3/uL (ref 0.7–4.0)
Lymphocytes Relative: 26 %
MCH: 28 pg (ref 26.0–34.0)
MCHC: 32.2 g/dL (ref 30.0–36.0)
MCV: 86.8 fL (ref 78.0–100.0)
MONO ABS: 0.3 10*3/uL (ref 0.1–1.0)
MONOS PCT: 2 %
NEUTROS PCT: 69 %
Neutro Abs: 8.1 10*3/uL — ABNORMAL HIGH (ref 1.7–7.7)
PLATELETS: 200 10*3/uL (ref 150–400)
RBC: 4.4 MIL/uL (ref 3.87–5.11)
RDW: 14.2 % (ref 11.5–15.5)
WBC: 11.6 10*3/uL — ABNORMAL HIGH (ref 4.0–10.5)

## 2015-08-21 LAB — I-STAT BETA HCG BLOOD, ED (MC, WL, AP ONLY)

## 2015-08-21 LAB — URINALYSIS, ROUTINE W REFLEX MICROSCOPIC
Bilirubin Urine: NEGATIVE
GLUCOSE, UA: NEGATIVE mg/dL
HGB URINE DIPSTICK: NEGATIVE
KETONES UR: NEGATIVE mg/dL
LEUKOCYTES UA: NEGATIVE
Nitrite: NEGATIVE
PROTEIN: NEGATIVE mg/dL
Specific Gravity, Urine: 1.019 (ref 1.005–1.030)
pH: 6 (ref 5.0–8.0)

## 2015-08-21 LAB — COMPREHENSIVE METABOLIC PANEL
ALBUMIN: 3.7 g/dL (ref 3.5–5.0)
ALT: 15 U/L (ref 14–54)
ANION GAP: 7 (ref 5–15)
AST: 22 U/L (ref 15–41)
Alkaline Phosphatase: 53 U/L (ref 38–126)
BUN: <5 mg/dL — ABNORMAL LOW (ref 6–20)
CHLORIDE: 107 mmol/L (ref 101–111)
CO2: 25 mmol/L (ref 22–32)
Calcium: 9.3 mg/dL (ref 8.9–10.3)
Creatinine, Ser: 0.76 mg/dL (ref 0.44–1.00)
GFR calc non Af Amer: >60 mL/min (ref >60)
GLUCOSE: 94 mg/dL (ref 65–99)
POTASSIUM: 3.4 mmol/L — AB (ref 3.5–5.1)
SODIUM: 139 mmol/L (ref 135–145)
Total Bilirubin: 0.4 mg/dL (ref 0.3–1.2)
Total Protein: 6.3 g/dL — ABNORMAL LOW (ref 6.5–8.1)

## 2015-08-21 LAB — LIPASE, BLOOD: Lipase: 26 U/L (ref 11–51)

## 2015-08-21 MED ORDER — PB-HYOSCY-ATROPINE-SCOPOLAMINE 16.2 MG/5ML PO ELIX
5.0000 mL | ORAL_SOLUTION | Freq: Once | ORAL | Status: AC
Start: 2015-08-21 — End: 2015-08-21
  Administered 2015-08-21: 16.2 mg via ORAL
  Filled 2015-08-21: qty 5

## 2015-08-21 MED ORDER — PANTOPRAZOLE SODIUM 40 MG PO TBEC: 40.0000 mg | DELAYED_RELEASE_TABLET | Freq: Every day | ORAL | 3 refills | Status: DC

## 2015-08-21 MED ORDER — CALCIUM CARBONATE ANTACID 500 MG PO CHEW
2.0000 | CHEWABLE_TABLET | Freq: Once | ORAL | Status: AC
  Administered 2015-08-21: 400 mg via ORAL
  Filled 2015-08-21: qty 2

## 2015-08-21 NOTE — ED Triage Notes (Signed)
Patient presents with RUQ pain that has been increasing over the past few weeks.  Has history of gall stones and pancreatitis

## 2015-08-21 NOTE — ED Notes (Signed)
Pt provided with d/c instructions at this time.  Pt verbalizes understanding of d/c instructions as well as follow up procedure after d/c.  Pt provided with RX for protonix.  Pt verbalizes understanding of RX directions. Pt in no apparent distress at this time.  Pt ambulatory at time of d/c.  Pt unable to E-sign for d/c due to computer not functioning.

## 2015-08-21 NOTE — ED Provider Notes (Signed)
[facility] DEPT Provider Note   CSN: [id number] Arrival date & time: 08/21/15  1932  First Provider Contact:  None       History   Chief Complaint Chief Complaint  Patient presents with  . Abdominal Pain    HPI Jasmine [name] is a 25 y.o. female.  HPI   Patient's 25 year old female with a history of GERD, pancreatitis, seizures, asthma who presents to the emergency department with epigastric pain for the last 4 months that has progressively worsened in the last 5 days. Pain is stabbing, constant, intermittently radiates into her right side, worse after eating, 8/10. Associated nausea. Pain is worse after eating. Patient has been taking Advil and Motrin twice a day for 2 months without any pain relief. Patient please the pain began after an admission to the hospital for acute asthma exacerbation. She denies vomiting, diarrhea, fever, chills, hematochezia, vaginal discharge, dysuria, hematuria, flank pain.  Past Medical History:  Diagnosis Date  . Asthma   . GERD (gastroesophageal reflux disease)   . Heart murmur    last work up age 39- no symtoms  . Pancreatitis   . Seizures ([place])    epilepsy  . Sickle cell trait [facility])     Patient Active Problem List   Diagnosis Date Noted  . Adjustment disorder with mixed anxiety and depressed mood 04/26/2015  . Hypokalemia 04/19/2015  . Asthma with acute exacerbation 04/19/2015  . Asthma exacerbation 01/03/2015  . Influenza-like illness 01/03/2015  . Status post cesarean section 11/08/2014  . Previous cesarean section complicating pregnancy, antepartum condition or complication   . GERD (gastroesophageal reflux disease)   . Gastroesophageal reflux disease without esophagitis   . Abnormal biochemical finding on antenatal screening of mother   . Abnormal quad screen   . Echogenic focus of heart of fetus affecting antepartum care of mother   . Drug use affecting pregnancy, antepartum 05/19/2014  . Seizure disorder during pregnancy,  antepartum ([place]) 05/13/2014  . Supervision of high risk pregnancy, antepartum 05/13/2014  . Asthma complicating pregnancy, antepartum 05/13/2014  . History of food anaphylaxis 05/13/2014  . Seizure disorder, sept 2015 last seizure 01/19/2013    Past Surgical History:  Procedure Laterality Date  . APPENDECTOMY    . CESAREAN SECTION     x 1  . CESAREAN SECTION N/A 11/08/2014   Procedure: CESAREAN SECTION;  Surgeon: Truett Mainland, DO;  Location: [facility];  Service: Obstetrics;  Laterality: N/A;  . HAND SURGERY    . HERNIA REPAIR    . NERVE, TENDON AND ARTERY REPAIR Right 09/23/2012   Procedure: I&D and Repair As Necessary/Right Hand and Palm;  Surgeon: Roseanne Kaufman, MD;  Location: [facility];  Service: Orthopedics;  Laterality: Right;  . TONSILLECTOMY      OB History    Gravida Para Term Preterm AB Living   3 3 3     3    SAB TAB Ectopic Multiple Live Births         0 3       Home Medications    Prior to Admission medications   Medication Sig Start Date End Date Taking? Authorizing Provider  albuterol (PROVENTIL HFA;VENTOLIN HFA) 108 (90 Base) MCG/ACT inhaler Inhale 2 puffs into the lungs every 4 (four) hours as needed for wheezing or shortness of breath. 04/28/15  Yes Donita Brooks, NP  albuterol (PROVENTIL) (2.5 MG/3ML) 0.083% nebulizer solution Take 3 mLs (2.5 mg total) by nebulization every 4 (four) hours as needed for wheezing or shortness  of breath. 04/28/15  Yes Donita Brooks, NP  EPINEPHrine (ADRENALIN) 1 MG/ML injection Inject 1 mL (1 mg total) into the muscle once. Patient taking differently: Inject 1 mg into the muscle once as needed for anaphylaxis (severe allergic reation).  06/10/14  Yes Nila Nephew, MD  Fluticasone-Salmeterol (ADVAIR) 500-50 MCG/DOSE AEPB Inhale 1 puff into the lungs 2 (two) times daily.   Yes Historical Provider, MD  levETIRAcetam (KEPPRA) 500 MG tablet Take 1 tablet (500 mg total) by mouth 2 (two) times daily. 04/28/15  Yes Donita Brooks, NP    montelukast (SINGULAIR) 10 MG tablet Take 1 tablet (10 mg total) by mouth at bedtime. 07/06/14  Yes Shanker Kristeen Mans, MD  budesonide-formoterol (SYMBICORT) 160-4.5 MCG/ACT inhaler Inhale 2 puffs into the lungs 2 (two) times daily. Patient not taking: Reported on 08/21/2015 04/28/15   Donita Brooks, NP  cyclobenzaprine (FLEXERIL) 10 MG tablet Take 1 tablet (10 mg total) by mouth 3 (three) times daily as needed for muscle spasms (or pain). Patient not taking: Reported on 08/21/2015 08/07/15   Clayton Bibles, PA-C  OLANZapine (ZYPREXA) 5 MG tablet Take 1 tablet (5 mg total) by mouth at bedtime. Patient not taking: Reported on 08/21/2015 04/28/15   Donita Brooks, NP  pantoprazole (PROTONIX) 40 MG tablet Take 1 tablet (40 mg total) by mouth daily. 08/21/15   Kalman Drape, PA  Prenatal Multivit-Min-Fe-FA (PRENATAL VITAMINS) 0.8 MG tablet Take 1 tablet by mouth daily. Patient not taking: Reported on 01/03/2015 06/10/14   Nila Nephew, MD    Family History Family History  Problem Relation Age of Onset  . Cancer Mother     cervical cancer  . Asthma Mother   . Hypertension Father   . Sickle cell anemia Father   . Asthma Sister   . Diabetes Maternal Aunt   . Cancer Maternal Grandmother     Social History Social History  Substance Use Topics  . Smoking status: Former Smoker    Types: Cigarettes    Quit date: 03/27/2014  . Smokeless tobacco: Never Used  . Alcohol use No     Allergies   Cheese; Chocolate; Ibuprofen; Ivp dye [iodinated diagnostic agents]; Latex; Orange juice [orange oil]; Other; Peach [prunus persica]; Peanuts [peanut oil]; Pear; Prednisone; Raspberry; Tylenol [acetaminophen]; Amoxicillin; Apricot flavor; Doxycycline; Erythromycin; Milk of magnesia [magnesium hydroxide]; and Penicillins   Review of Systems Review of Systems  Constitutional: Negative for chills and fever.  HENT: Negative for trouble swallowing.   Respiratory: Negative for shortness of breath.   Cardiovascular:  Negative for chest pain.  Gastrointestinal: Positive for abdominal pain and nausea. Negative for blood in stool, diarrhea and vomiting.  Genitourinary: Negative for dysuria, flank pain and hematuria.  Skin: Negative for rash.     Physical Exam Updated Vital Signs BP 122/59 (BP Location: Left Arm)   Pulse 65   Temp 98.3 F (36.8 C) (Oral)   Resp 16   LMP 07/16/2015 (Approximate)   SpO2 100%   Physical Exam  Constitutional: She appears well-developed and well-nourished. No distress.  HENT:  Head: Normocephalic and atraumatic.  Eyes: Conjunctivae are normal.  Neck: Normal range of motion.  Cardiovascular: Normal rate, regular rhythm and normal heart sounds.  Exam reveals no gallop and no friction rub.   No murmur heard. Pulses:      Dorsalis pedis pulses are 2+ on the right side, and 2+ on the left side.  Pulmonary/Chest: Effort normal and breath sounds normal. No respiratory distress. She has  no decreased breath sounds. She has no wheezes. She has no rhonchi. She has no rales.  Abdominal: Soft. Bowel sounds are normal. She exhibits no distension. There is no rigidity, no rebound, no guarding, no CVA tenderness and negative Murphy's sign.  Mild epigastric tenderness  Musculoskeletal: Normal range of motion. She exhibits no edema.  Neurological: She is alert. Coordination normal.  Skin: Skin is warm and dry. She is not diaphoretic.  Psychiatric: She has a normal mood and affect. Her behavior is normal.  Nursing note and vitals reviewed.    ED Treatments / Results  Labs (all labs ordered are listed, but only abnormal results are displayed) Labs Reviewed  CBC WITH DIFFERENTIAL/PLATELET - Abnormal; Notable for the following:       Result Value   WBC 11.6 (*)    Neutro Abs 8.1 (*)    All other components within normal limits  COMPREHENSIVE METABOLIC PANEL - Abnormal; Notable for the following:    Potassium 3.4 (*)    BUN <5 (*)    Total Protein 6.3 (*)    All other  components within normal limits  URINALYSIS, ROUTINE W REFLEX MICROSCOPIC (NOT AT [facility]) - Abnormal; Notable for the following:    APPearance CLOUDY (*)    All other components within normal limits  LIPASE, BLOOD  I-STAT BETA HCG BLOOD, ED ([facility], [facility], [facility] ONLY)    EKG  EKG Interpretation None       Radiology No results found.  Procedures Procedures (including critical care time)  Medications Ordered in ED Medications  belladonna-PHENObarbital (DONNATAL) 16.2 MG/5ML elixir 16.2 mg (16.2 mg Oral Given 08/21/15 2240)  calcium carbonate (TUMS - dosed in mg elemental calcium) chewable tablet 400 mg of elemental calcium (400 mg of elemental calcium Oral Given 08/21/15 2240)     Initial Impression / Assessment and Plan / ED Course  I have reviewed the triage vital signs and the nursing notes.  Pertinent labs & imaging results that were available during my care of the patient were reviewed by me and considered in my medical decision making (see chart for details).  Clinical Course   Patient with epigastric pain. Patient unable to take GI cocktail she is allergic to Maalox. We'll give donnatal and tums and reassess.  Patient with history of GERD but she is not taking anything for it. History and presentation is concerning for gastric ulcer. Patient did not have relief with the donnatal or tums. Patient afebrile, denies vomiting or diarrhea, mild nonspecific leukocytosis, hCG negative, labs and UA unremarkable, VSS.   Patient is nontoxic, nonseptic appearing, in no apparent distress. Patient does not meet the SIRS or Sepsis criteria.  On repeat exam patient does not have a surgical abdomin and there are no peritoneal signs.  No indication of appendicitis, bowel obstruction, bowel perforation, cholecystitis, diverticulitis, PID or ectopic pregnancy.  Will discharge patient on Protonix and close follow-up at the [facility] as patient does not have a PCP. I  instructed the patient to avoid alcohol, NSAIDs and spicy food at this time. I did discussed strict return precautions. Patient expressed understanding to the discharge instructions.  Discussed case with Dr. Stark Jock who agrees with the above plan.  Final Clinical Impressions(s) / ED Diagnoses   Final diagnoses:  Epigastric abdominal pain  Nausea    New Prescriptions Discharge Medication List as of 08/21/2015 10:52 PM       Kalman Drape, PA 08/22/15 [place]  [place], MD 08/22/15 [id number]

## 2015-08-21 NOTE — Discharge Instructions (Signed)
Take the protonix as prescribed. Do not take Motrin, ibuprofen, Aleve, Advil or drink alcohol. Avoid spicy foods. Follow-up at the [facility] to be reevaluated.   Return to the emergency department if he experienced vomiting, blood in your vomit, black tarry stools, worsening abdominal pain, fever, or any other concerning symptoms.

## 2015-08-31 ENCOUNTER — Inpatient Hospital Stay: Payer: Self-pay

## 2015-09-22 ENCOUNTER — Ambulatory Visit: Payer: Medicaid Other | Attending: Internal Medicine | Admitting: Internal Medicine

## 2015-09-22 VITALS — BP 124/79 | HR 93 | Temp 98.5°F | Resp 18 | Ht 63.0 in | Wt 157.0 lb

## 2015-09-22 DIAGNOSIS — J45901 Unspecified asthma with (acute) exacerbation: Secondary | ICD-10-CM | POA: Insufficient documentation

## 2015-09-22 DIAGNOSIS — Z9109 Other allergy status, other than to drugs and biological substances: Secondary | ICD-10-CM | POA: Diagnosis not present

## 2015-09-22 DIAGNOSIS — Z832 Family history of diseases of the blood and blood-forming organs and certain disorders involving the immune mechanism: Secondary | ICD-10-CM | POA: Diagnosis not present

## 2015-09-22 DIAGNOSIS — D573 Sickle-cell trait: Secondary | ICD-10-CM | POA: Insufficient documentation

## 2015-09-22 DIAGNOSIS — J0100 Acute maxillary sinusitis, unspecified: Secondary | ICD-10-CM

## 2015-09-22 DIAGNOSIS — Z886 Allergy status to analgesic agent status: Secondary | ICD-10-CM | POA: Diagnosis not present

## 2015-09-22 DIAGNOSIS — Z880 Allergy status to penicillin: Secondary | ICD-10-CM | POA: Diagnosis not present

## 2015-09-22 DIAGNOSIS — Z833 Family history of diabetes mellitus: Secondary | ICD-10-CM | POA: Diagnosis not present

## 2015-09-22 DIAGNOSIS — Z91010 Allergy to peanuts: Secondary | ICD-10-CM | POA: Insufficient documentation

## 2015-09-22 DIAGNOSIS — Z91018 Allergy to other foods: Secondary | ICD-10-CM | POA: Insufficient documentation

## 2015-09-22 DIAGNOSIS — Z8249 Family history of ischemic heart disease and other diseases of the circulatory system: Secondary | ICD-10-CM | POA: Insufficient documentation

## 2015-09-22 DIAGNOSIS — K859 Acute pancreatitis without necrosis or infection, unspecified: Secondary | ICD-10-CM | POA: Insufficient documentation

## 2015-09-22 DIAGNOSIS — K219 Gastro-esophageal reflux disease without esophagitis: Secondary | ICD-10-CM | POA: Insufficient documentation

## 2015-09-22 DIAGNOSIS — R011 Cardiac murmur, unspecified: Secondary | ICD-10-CM | POA: Insufficient documentation

## 2015-09-22 DIAGNOSIS — G40909 Epilepsy, unspecified, not intractable, without status epilepticus: Secondary | ICD-10-CM | POA: Insufficient documentation

## 2015-09-22 DIAGNOSIS — Z800 Family history of malignant neoplasm of digestive organs: Secondary | ICD-10-CM | POA: Diagnosis not present

## 2015-09-22 DIAGNOSIS — Z87891 Personal history of nicotine dependence: Secondary | ICD-10-CM | POA: Insufficient documentation

## 2015-09-22 DIAGNOSIS — Z809 Family history of malignant neoplasm, unspecified: Secondary | ICD-10-CM | POA: Diagnosis not present

## 2015-09-22 DIAGNOSIS — G8929 Other chronic pain: Secondary | ICD-10-CM

## 2015-09-22 DIAGNOSIS — R1013 Epigastric pain: Secondary | ICD-10-CM | POA: Diagnosis not present

## 2015-09-22 DIAGNOSIS — R569 Unspecified convulsions: Secondary | ICD-10-CM

## 2015-09-22 DIAGNOSIS — Z881 Allergy status to other antibiotic agents status: Secondary | ICD-10-CM | POA: Insufficient documentation

## 2015-09-22 DIAGNOSIS — F32A Depression, unspecified: Secondary | ICD-10-CM

## 2015-09-22 DIAGNOSIS — F329 Major depressive disorder, single episode, unspecified: Secondary | ICD-10-CM | POA: Insufficient documentation

## 2015-09-22 DIAGNOSIS — Z91040 Latex allergy status: Secondary | ICD-10-CM | POA: Insufficient documentation

## 2015-09-22 MED ORDER — PANTOPRAZOLE SODIUM 40 MG PO TBEC: 40.0000 mg | DELAYED_RELEASE_TABLET | Freq: Every day | ORAL | 3 refills | Status: DC

## 2015-09-22 MED ORDER — ALBUTEROL SULFATE (2.5 MG/3ML) 0.083% IN NEBU: 2.5000 mg | INHALATION_SOLUTION | RESPIRATORY_TRACT | 3 refills | Status: DC | PRN

## 2015-09-22 MED ORDER — METHYLPREDNISOLONE SODIUM SUCC 125 MG IJ SOLR
125.0000 mg | Freq: Once | INTRAMUSCULAR | Status: AC
  Administered 2015-09-22: 125 mg via INTRAMUSCULAR

## 2015-09-22 MED ORDER — ALBUTEROL SULFATE HFA 108 (90 BASE) MCG/ACT IN AERS: 2.0000 | INHALATION_SPRAY | RESPIRATORY_TRACT | 3 refills | Status: DC | PRN

## 2015-09-22 MED ORDER — SUCRALFATE 1 GM/10ML PO SUSP: 1.0000 g | Freq: Three times a day (TID) | ORAL | 0 refills | Status: DC

## 2015-09-22 MED ORDER — MONTELUKAST SODIUM 10 MG PO TABS: 10.0000 mg | ORAL_TABLET | Freq: Every day | ORAL | 3 refills | Status: DC

## 2015-09-22 MED ORDER — LEVOFLOXACIN 500 MG PO TABS: 500.0000 mg | ORAL_TABLET | Freq: Every day | ORAL | 0 refills | Status: DC

## 2015-09-22 MED ORDER — LEVETIRACETAM 500 MG PO TABS: 500.0000 mg | ORAL_TABLET | Freq: Two times a day (BID) | ORAL | 3 refills | Status: DC

## 2015-09-22 MED ORDER — IPRATROPIUM-ALBUTEROL 0.5-2.5 (3) MG/3ML IN SOLN: 3.0000 mL | Freq: Once | RESPIRATORY_TRACT | Status: DC

## 2015-09-22 MED ORDER — FLUTICASONE PROPIONATE 50 MCG/ACT NA SUSP: 2.0000 | Freq: Every day | NASAL | 6 refills | Status: DC

## 2015-09-22 MED ORDER — BUDESONIDE-FORMOTEROL FUMARATE 160-4.5 MCG/ACT IN AERO: 2.0000 | INHALATION_SPRAY | Freq: Two times a day (BID) | RESPIRATORY_TRACT | 12 refills | Status: DC

## 2015-09-22 MED ORDER — OLANZAPINE 5 MG PO TABS: 5.0000 mg | ORAL_TABLET | Freq: Every day | ORAL | 3 refills | Status: DC

## 2015-09-22 MED ORDER — IPRATROPIUM-ALBUTEROL 0.5-2.5 (3) MG/3ML IN SOLN
3.0000 mL | Freq: Four times a day (QID) | RESPIRATORY_TRACT | Status: DC
  Administered 2015-09-22: 3 mL via RESPIRATORY_TRACT

## 2015-09-22 NOTE — Patient Instructions (Signed)
Asthma, Acute Bronchospasm Acute bronchospasm caused by asthma is also referred to as an asthma attack. Bronchospasm means your air passages become narrowed. The narrowing is caused by inflammation and tightening of the muscles in the air tubes (bronchi) in your lungs. This can make it hard to breathe or cause you to wheeze and cough. CAUSES Possible triggers are:  Animal dander from the skin, hair, or feathers of animals.  Dust mites contained in house dust.  Cockroaches.  Pollen from trees or grass.  Mold.  Cigarette or tobacco smoke.  Air pollutants such as dust, household cleaners, hair sprays, aerosol sprays, paint fumes, strong chemicals, or strong odors.  Cold air or weather changes. Cold air may trigger inflammation. Winds increase molds and pollens in the air.  Strong emotions such as crying or laughing hard.  Stress.  Certain medicines such as aspirin or beta-blockers.  Sulfites in foods and drinks, such as dried fruits and wine.  Infections or inflammatory conditions, such as a flu, cold, or inflammation of the nasal membranes (rhinitis).  Gastroesophageal reflux disease (GERD). GERD is a condition where stomach acid backs up into your esophagus.  Exercise or strenuous activity. SIGNS AND SYMPTOMS   Wheezing.  Excessive coughing, particularly at night.  Chest tightness.  Shortness of breath. DIAGNOSIS  Your health care provider will ask you about your medical history and perform a physical exam. A chest X-ray or blood testing may be performed to look for other causes of your symptoms or other conditions that may have triggered your asthma attack. TREATMENT  Treatment is aimed at reducing inflammation and opening up the airways in your lungs. Most asthma attacks are treated with inhaled medicines. These include quick relief or rescue medicines (such as bronchodilators) and controller medicines (such as inhaled corticosteroids). These medicines are sometimes  given through an inhaler or a nebulizer. Systemic steroid medicine taken by mouth or given through an IV tube also can be used to reduce the inflammation when an attack is moderate or severe. Antibiotic medicines are only used if a bacterial infection is present.  HOME CARE INSTRUCTIONS   Rest.  Drink plenty of liquids. This helps the mucus to remain thin and be easily coughed up. Only use caffeine in moderation and do not use alcohol until you have recovered from your illness.  Do not smoke. Avoid being exposed to secondhand smoke.  You play a critical role in keeping yourself in good health. Avoid exposure to things that cause you to wheeze or to have breathing problems.  Keep your medicines up-to-date and available. Carefully follow your health care provider's treatment plan.  Take your medicine exactly as prescribed.  When pollen or pollution is bad, keep windows closed and use an air conditioner or go to places with air conditioning.  Asthma requires careful medical care. See your health care provider for a follow-up as advised. If you are more than [redacted] weeks pregnant and you were prescribed any new medicines, let your obstetrician know about the visit and how you are doing. Follow up with your health care provider as directed.  After you have recovered from your asthma attack, make an appointment with your outpatient doctor to talk about ways to reduce the likelihood of future attacks. If you do not have a doctor who manages your asthma, make an appointment with a primary care doctor to discuss your asthma. SEEK IMMEDIATE MEDICAL CARE IF:   You are getting worse.  You have trouble breathing. If severe, call your local   emergency services (911 in the [place]  You develop chest pain or discomfort.  You are vomiting.  You are not able to keep fluids down.  You are coughing up yellow, green, brown, or bloody sputum.  You have a fever and your symptoms suddenly get worse.  You have  trouble swallowing. MAKE SURE YOU:   Understand these instructions.  Will watch your condition.  Will get help right away if you are not doing well or get worse.   This information is not intended to replace advice given to you by your health care provider. Make sure you discuss any questions you have with your health care provider.   Document Released: 04/18/2006 Document Revised: 01/06/2013 Document Reviewed: 07/09/2012 Elsevier Interactive Patient Education 2016 [place] Can Quit Smoking If you are ready to quit smoking or are thinking about it, congratulations! You have chosen to help yourself be healthier and live longer! There are lots of different ways to quit smoking. Nicotine gum, nicotine patches, a nicotine inhaler, or nicotine nasal spray can help with physical craving. Hypnosis, support groups, and medicines help break the habit of smoking. TIPS TO GET OFF AND STAY OFF CIGARETTES  Learn to predict your moods. Do not let a bad situation be your excuse to have a cigarette. Some situations in your life might tempt you to have a cigarette.  Ask friends and co-workers not to smoke around you.  Make your home smoke-free.  Never have "just one" cigarette. It leads to wanting another and another. Remind yourself of your decision to quit.  On a card, make a list of your reasons for not smoking. Read it at least the same number of times a day as you have a cigarette. Tell yourself everyday, "I do not want to smoke. I choose not to smoke."  Ask someone at home or work to help you with your plan to quit smoking.  Have something planned after you eat or have a cup of coffee. Take a walk or get other exercise to perk you up. This will help to keep you from overeating.  Try a relaxation exercise to calm you down and decrease your stress. Remember, you may be tense and nervous the first two weeks after you quit. This will pass.  Find new activities to keep your hands  busy. Play with a pen, coin, or rubber band. Doodle or draw things on paper.  Brush your teeth right after eating. This will help cut down the craving for the taste of tobacco after meals. You can try mouthwash too.  Try gum, breath mints, or diet candy to keep something in your mouth. IF YOU SMOKE AND WANT TO QUIT:  Do not stock up on cigarettes. Never buy a carton. Wait until one pack is finished before you buy another.  Never carry cigarettes with you at work or at home.  Keep cigarettes as far away from you as possible. Leave them with someone else.  Never carry matches or a lighter with you.  Ask yourself, "Do I need this cigarette or is this just a reflex?"  Bet with someone that you can quit. Put cigarette money in a piggy bank every morning. If you smoke, you give up the money. If you do not smoke, by the end of the week, you keep the money.  Keep trying. It takes 21 days to change a habit!  Talk to your doctor about using medicines to help you quit. These include nicotine  replacement gum, lozenges, or skin patches.   This information is not intended to replace advice given to you by your health care provider. Make sure you discuss any questions you have with your health care provider.   Document Released: 10/28/2008 Document Revised: 03/26/2011 Document Reviewed: 10/28/2008 Elsevier Interactive Patient Education 2016 [place] for Peptic Ulcer Disease When you have peptic ulcer disease, the foods you eat and your eating habits are very important. Choosing the right foods can help ease the discomfort of peptic ulcer disease. WHAT GENERAL GUIDELINES DO I NEED TO FOLLOW?  Choose fruits, vegetables, whole grains, and low-fat meat, fish, and poultry.   Keep a food diary to identify foods that cause symptoms.  Avoid foods that cause irritation or pain. These may be different for different people.  Eat frequent small meals instead of three large meals  each day. The pain may be worse when your stomach is empty.  Avoid eating close to bedtime. WHAT FOODS ARE NOT RECOMMENDED? The following are some foods and drinks that may worsen your symptoms:  Black, white, and red pepper.  Hot sauce.  Chili peppers.  Chili powder.  Chocolate and cocoa.   Alcohol.  Tea, coffee, and cola (regular and decaffeinated). The items listed above may not be a complete list of foods and beverages to avoid. Contact your dietitian for more information.   This information is not intended to replace advice given to you by your health care provider. Make sure you discuss any questions you have with your health care provider.   Document Released: 03/26/2011 Document Revised: 01/06/2013 Document Reviewed: 11/05/2012 Elsevier Interactive Patient Education [employer].
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
## 2015-09-22 NOTE — Progress Notes (Signed)
Patient is here for ED FU  Patient complains of epigastric pain being present and progressing since April. Patient complains of current pains being present and scaled at a 10.  Patient has not taken medication and patient has not eaten today.  Patient request refills on Inhalers, Singulair, Zyprexa, and Protonix.  Patient tolerated injection well today and breathing treatment today. PEAK FLOW PRE-TX 100 60 60 PEAK FLOW POST-TX 250 240 240

## 2015-09-26 ENCOUNTER — Encounter (INDEPENDENT_AMBULATORY_CARE_PROVIDER_SITE_OTHER): Payer: Self-pay

## 2015-09-27 ENCOUNTER — Ambulatory Visit (HOSPITAL_COMMUNITY): Payer: Medicaid Other

## 2015-10-03 ENCOUNTER — Ambulatory Visit (HOSPITAL_COMMUNITY)
Admission: RE | Admit: 2015-10-03 | Discharge: 2015-10-03 | Disposition: A | Discharge status: Home / Self Care | Payer: Medicaid Other | Source: Ambulatory Visit | Attending: Internal Medicine | Admitting: Internal Medicine

## 2015-10-03 DIAGNOSIS — R1013 Epigastric pain: Secondary | ICD-10-CM | POA: Diagnosis present

## 2015-10-03 DIAGNOSIS — G8929 Other chronic pain: Secondary | ICD-10-CM

## 2015-10-03 DIAGNOSIS — K8020 Calculus of gallbladder without cholecystitis without obstruction: Secondary | ICD-10-CM | POA: Insufficient documentation

## 2015-10-07 ENCOUNTER — Telehealth: Payer: Self-pay

## 2015-10-07 DIAGNOSIS — K8020 Calculus of gallbladder without cholecystitis without obstruction: Secondary | ICD-10-CM

## 2015-10-07 NOTE — Telephone Encounter (Signed)
Contacted pt to go over lab results pt gave me verbal permission to speak with her mother because she makes all her appointments. Pt mother is aware of the results and pt mother states she has medicaid. I informed pt mother that it does take 1-2 weeks to hear from them to schedule an appointment

## 2015-10-10 ENCOUNTER — Ambulatory Visit: Payer: Medicaid Other | Attending: Internal Medicine | Admitting: Internal Medicine

## 2015-10-10 VITALS — BP 118/73 | HR 91 | Temp 98.6°F | Resp 16 | Ht 63.0 in | Wt 156.0 lb

## 2015-10-10 DIAGNOSIS — R1013 Epigastric pain: Secondary | ICD-10-CM | POA: Diagnosis not present

## 2015-10-10 DIAGNOSIS — R109 Unspecified abdominal pain: Secondary | ICD-10-CM | POA: Diagnosis present

## 2015-10-10 DIAGNOSIS — Z880 Allergy status to penicillin: Secondary | ICD-10-CM | POA: Insufficient documentation

## 2015-10-10 DIAGNOSIS — G8929 Other chronic pain: Secondary | ICD-10-CM | POA: Diagnosis not present

## 2015-10-10 DIAGNOSIS — Z23 Encounter for immunization: Secondary | ICD-10-CM

## 2015-10-10 DIAGNOSIS — K8020 Calculus of gallbladder without cholecystitis without obstruction: Secondary | ICD-10-CM | POA: Diagnosis not present

## 2015-10-10 DIAGNOSIS — Z79899 Other long term (current) drug therapy: Secondary | ICD-10-CM | POA: Insufficient documentation

## 2015-10-10 MED ORDER — DICYCLOMINE HCL 10 MG PO CAPS: 10.0000 mg | ORAL_CAPSULE | Freq: Three times a day (TID) | ORAL | 0 refills | Status: DC

## 2015-10-10 NOTE — Progress Notes (Signed)
F/u  abd pain; s/p ultrasound

## 2015-10-10 NOTE — Patient Instructions (Addendum)
Cholelithiasis Cholelithiasis (also called gallstones) is a form of gallbladder disease in which gallstones form in your gallbladder. The gallbladder is an organ that stores bile made in the liver, which helps digest fats. Gallstones begin as small crystals and slowly grow into stones. Gallstone pain occurs when the gallbladder spasms and a gallstone is blocking the duct. Pain can also occur when a stone passes out of the duct.  RISK FACTORS  Being female.   Having multiple pregnancies. Health care providers sometimes advise removing diseased gallbladders before future pregnancies.   Being obese.  Eating a diet heavy in fried foods and fat.   Being older than 84 years and increasing age.   Prolonged use of medicines containing female hormones.   Having diabetes mellitus.   Rapidly losing weight.   Having a family history of gallstones (heredity).  SYMPTOMS  Nausea.   Vomiting.  Abdominal pain.   Yellowing of the skin (jaundice).   Sudden pain. It may persist from several minutes to several hours.  Fever.   Tenderness to the touch. In some cases, when gallstones do not move into the bile duct, people have no pain or symptoms. These are called "silent" gallstones.  TREATMENT Silent gallstones do not need treatment. In severe cases, emergency surgery may be required. Options for treatment include:  Surgery to remove the gallbladder. This is the most common treatment.  Medicines. These do not always work and may take 6-12 months or more to work.  Shock wave treatment (extracorporeal biliary lithotripsy). In this treatment an ultrasound machine sends shock waves to the gallbladder to break gallstones into smaller pieces that can pass into the intestines or be dissolved by medicine. HOME CARE INSTRUCTIONS   Only take over-the-counter or prescription medicines for pain, discomfort, or fever as directed by your health care provider.   Follow a low-fat diet until  seen again by your health care provider. Fat causes the gallbladder to contract, which can result in pain.   Follow up with your health care provider as directed. Attacks are almost always recurrent and surgery is usually required for permanent treatment.  SEEK IMMEDIATE MEDICAL CARE IF:   Your pain increases and is not controlled by medicines.   You have a fever or persistent symptoms for more than 2-3 days.   You have a fever and your symptoms suddenly get worse.   You have persistent nausea and vomiting.  MAKE SURE YOU:   Understand these instructions.  Will watch your condition.  Will get help right away if you are not doing well or get worse.   This information is not intended to replace advice given to you by your health care provider. Make sure you discuss any questions you have with your health care provider.   Document Released: 12/28/2004 Document Revised: 09/03/2012 Document Reviewed: 06/25/2012 Elsevier Interactive Patient Education 2016 Elsevier Inc. Influenza Virus Vaccine injection (Fluarix) What is this medicine? INFLUENZA VIRUS VACCINE (in floo EN zuh VAHY ruhs vak SEEN) helps to reduce the risk of getting influenza also known as the flu. This medicine may be used for other purposes; ask your health care provider or pharmacist if you have questions. What should I tell my health care provider before I take this medicine? They need to know if you have any of these conditions: -bleeding disorder like hemophilia -fever or infection -Guillain-Barre syndrome or other neurological problems -immune system problems -infection with the human immunodeficiency virus (HIV) or AIDS -low blood platelet counts -multiple sclerosis -an unusual  or allergic reaction to influenza virus vaccine, eggs, chicken proteins, latex, gentamicin, other medicines, foods, dyes or preservatives -pregnant or trying to get pregnant -breast-feeding How should I use this medicine? This  vaccine is for injection into a muscle. It is given by a health care professional. A copy of Vaccine Information Statements will be given before each vaccination. Read this sheet carefully each time. The sheet may change frequently. Talk to your pediatrician regarding the use of this medicine in children. Special care may be needed. Overdosage: If you think you have taken too much of this medicine contact a poison control center or emergency room at once. NOTE: This medicine is only for you. Do not share this medicine with others. What if I miss a dose? This does not apply. What may interact with this medicine? -chemotherapy or radiation therapy -medicines that lower your immune system like etanercept, anakinra, infliximab, and adalimumab -medicines that treat or prevent blood clots like warfarin -phenytoin -steroid medicines like prednisone or cortisone -theophylline -vaccines This list may not describe all possible interactions. Give your health care provider a list of all the medicines, herbs, non-prescription drugs, or dietary supplements you use. Also tell them if you smoke, drink alcohol, or use illegal drugs. Some items may interact with your medicine. What should I watch for while using this medicine? Report any side effects that do not go away within 3 days to your doctor or health care professional. Call your health care provider if any unusual symptoms occur within 6 weeks of receiving this vaccine. You may still catch the flu, but the illness is not usually as bad. You cannot get the flu from the vaccine. The vaccine will not protect against colds or other illnesses that may cause fever. The vaccine is needed every year. What side effects may I notice from receiving this medicine? Side effects that you should report to your doctor or health care professional as soon as possible: -allergic reactions like skin rash, itching or hives, swelling of the face, lips, or tongue Side effects  that usually do not require medical attention (report to your doctor or health care professional if they continue or are bothersome): -fever -headache -muscle aches and pains -pain, tenderness, redness, or swelling at site where injected -weak or tired This list may not describe all possible side effects. Call your doctor for medical advice about side effects. You may report side effects to FDA at 1-800-FDA-1088. Where should I keep my medicine? This vaccine is only given in a clinic, pharmacy, doctor's office, or other health care setting and will not be stored at home. NOTE: This sheet is a summary. It may not cover all possible information. If you have questions about this medicine, talk to your doctor, pharmacist, or health care provider.    2016, Elsevier/Gold Standard. (2007-07-30 09:30:40)

## 2015-10-10 NOTE — Telephone Encounter (Signed)
Ref for gs made. thanks

## 2015-10-10 NOTE — Progress Notes (Signed)
Jasmine [name], is a 25 y.o. female  [id number]  [id number]  DOB - January 24, 1990  Chief Complaint  Patient presents with  . Abdominal Pain  . Cholelithiasis        Subjective:   Jasmine [name] is a 25 y.o. female here today for a follow up visit of chronic abd pain.  Pt states has tried carafate and ppi, w/o relief.  Still gets meg pain that radiates to ruq abd when eats food, esp fatty foods, which she is working on trying to avoid.  Denies n/v/d/f/c.  Here w/ her 2 young children and mother.   Patient has No headache, No chest pain, No Nausea, No new weakness tingling or numbness, No Cough - SOB.  No problems updated.  ALLERGIES: Allergies  Allergen Reactions  . Cheese Shortness Of Breath    Asthma trigger  . Chocolate Shortness Of Breath    Asthma trigger  . Ibuprofen Hives and Shortness Of Breath    Tolerates naproxen  . Ivp Dye [Iodinated Diagnostic Agents] Shortness Of Breath  . Latex Anaphylaxis and Swelling    Swelling wherever skin is touched  . Orange Juice [Orange Oil] Shortness Of Breath  . Other Hives    ALLERGIC TO ALL STEROIDS       EXCEPT IV SOLU MEDROL  . Peach [Prunus Persica] Anaphylaxis  . Peanuts [Peanut Oil] Anaphylaxis  . Pear Anaphylaxis  . Prednisone Hives    Pt took prednisone / IV steroids 04/2015 admission without difficulty  . Raspberry Anaphylaxis  . Tylenol [Acetaminophen] Hives and Shortness Of Breath  . Amoxicillin Hives    Has patient had a PCN reaction causing immediate rash, facial/tongue/throat swelling, SOB or lightheadedness with hypotension:Yes Has patient had a PCN reaction causing severe rash involving mucus membranes or skin necrosis:No Has patient had a PCN reaction that required hospitalization:no Has patient had a PCN reaction occurring within the last 10 years:No If all of the above answers are "NO", then may proceed with Cephalosporin use.     Marland Kitchen Apricot Flavor Hives  . Doxycycline Hives  . Erythromycin  Hives  . Milk Of Magnesia [Magnesium Hydroxide] Hives and Itching  . Penicillins Hives    Has patient had a PCN reaction causing immediate rash, facial/tongue/throat swelling, SOB or lightheadedness with hypotension: Yes Has patient had a PCN reaction causing severe rash involving mucus membranes or skin necrosis: No Has patient had a PCN reaction that required hospitalization Yes Has patient had a PCN reaction occurring within the last 10 years: No If all of the above answers are "NO", then may proceed with Cephalosporin use.     PAST MEDICAL HISTORY: Past Medical History:  Diagnosis Date  . Asthma   . GERD (gastroesophageal reflux disease)   . Heart murmur    last work up age 33- no symtoms  . Pancreatitis   . Seizures ([place])    epilepsy  . Sickle cell trait ([place])     MEDICATIONS AT HOME: Prior to Admission medications   Medication Sig Start Date End Date Taking? Authorizing Provider  albuterol (PROVENTIL HFA;VENTOLIN HFA) 108 (90 Base) MCG/ACT inhaler Inhale 2 puffs into the lungs every 4 (four) hours as needed for wheezing or shortness of breath. 09/22/15   Maren Reamer, MD  albuterol (PROVENTIL) (2.5 MG/3ML) 0.083% nebulizer solution Take 3 mLs (2.5 mg total) by nebulization every 4 (four) hours as needed for wheezing or shortness of breath. 09/22/15   Maren Reamer, MD  budesonide-formoterol (SYMBICORT) 160-4.5 MCG/ACT  inhaler Inhale 2 puffs into the lungs 2 (two) times daily. 09/22/15   Maren Reamer, MD  cyclobenzaprine (FLEXERIL) 10 MG tablet Take 1 tablet (10 mg total) by mouth 3 (three) times daily as needed for muscle spasms (or pain). 08/07/15   Clayton Bibles, PA-C  dicyclomine (BENTYL) 10 MG capsule Take 1 capsule (10 mg total) by mouth 4 (four) times daily -  before meals and at bedtime. 10/10/15   Maren Reamer, MD  EPINEPHrine (ADRENALIN) 1 MG/ML injection Inject 1 mL (1 mg total) into the muscle once. Patient taking differently: Inject 1 mg into the muscle once as  needed for anaphylaxis (severe allergic reation).  06/10/14   Nila Nephew, MD  fluticasone (FLONASE) 50 MCG/ACT nasal spray Place 2 sprays into both nostrils daily. 09/22/15   Maren Reamer, MD  levETIRAcetam (KEPPRA) 500 MG tablet Take 1 tablet (500 mg total) by mouth 2 (two) times daily. 09/22/15   Maren Reamer, MD  levofloxacin (LEVAQUIN) 500 MG tablet Take 1 tablet (500 mg total) by mouth daily. 09/22/15   Maren Reamer, MD  montelukast (SINGULAIR) 10 MG tablet Take 1 tablet (10 mg total) by mouth at bedtime. 09/22/15   Maren Reamer, MD  OLANZapine (ZYPREXA) 5 MG tablet Take 1 tablet (5 mg total) by mouth at bedtime. 09/22/15   Maren Reamer, MD  pantoprazole (PROTONIX) 40 MG tablet Take 1 tablet (40 mg total) by mouth daily. 09/22/15   Maren Reamer, MD  sucralfate (CARAFATE) 1 GM/10ML suspension Take 10 mLs (1 g total) by mouth 4 (four) times daily -  with meals and at bedtime. 09/22/15   Maren Reamer, MD     Objective:   Vitals:   10/10/15 0943  BP: 118/73  Pulse: 91  Resp: 16  Temp: 98.6 F (37 C)  TempSrc: Oral  SpO2: 100%  Weight: 156 lb (70.8 kg)  Height: 5\' 3"  (1.6 m)    Exam General appearance : Awake, alert, not in any distress. Speech Clear. Not toxic looking, pleasant HEENT: Atraumatic and Normocephalic, pupils equally reactive to light. Neck: supple, no JVD.  Chest:Good air entry bilaterally, no added sounds. CVS: S1 S2 regular, no murmurs/gallups or rubs. Abdomen: Bowel sounds active, obese, mild ttp to meg and ruq, neg Murphy's sign.  Extremities: B/L Lower Ext shows no edema, both legs are warm to touch Neurology: Awake alert, and oriented X 3, CN II-XII grossly intact, Non focal Skin:No Rash  Data Review No results found for: HGBA1C  Depression screen [facility] 2/9 10/10/2015 09/22/2015 10/07/2014 09/09/2014 07/29/2014  Decreased Interest 1 3 0 0 0  Down, Depressed, Hopeless 0 0 0 0 0  PHQ - 2 Score 1 3 0 0 0  Altered sleeping 1 3 - - -  Tired,  decreased energy 1 2 - - -  Change in appetite 1 3 - - -  Feeling bad or failure about yourself  0 0 - - -  Trouble concentrating 0 0 - - -  Moving slowly or fidgety/restless 0 0 - - -  Suicidal thoughts 0 0 - - -  PHQ-9 Score 4 11 - - -    [place] abd 10/03/15 CLINICAL DATA:  Chronic epigastric pain.  EXAM: US ABDOMEN LIMITED - RIGHT UPPER QUADRANT  COMPARISON:  CT 04/21/2015  FINDINGS: Gallbladder:  Multiple gallstones noted within the gallbladder. No wall thickening. Negative sonographic Murphy's.  Common bile duct:  Diameter: Normal caliber, 2-3 mm.  Liver:  No focal lesion identified. Within normal limits in parenchymal echogenicity.  IMPRESSION: Cholelithiasis.  No sonographic evidence of acute cholecystitis.   Electronically Signed   By: Rolm Baptise M.D.   On: 10/03/2015 09:03    Assessment & Plan   1. Calculus of gallbladder without cholecystitis without obstruction Persistent pain, ppi/carafate not helpful, suspect needs ccy - amb ref gs placed - recd avoid fatty foods for now - prn bentyl  2. Epigastric pain See above.     Patient have been counseled extensively about nutrition and exercise  Return in about 3 months (around 01/09/2016).  The patient was given clear instructions to go to ER or return to medical center if symptoms don't improve, worsen or new problems develop. The patient verbalized understanding. The patient was told to call to get lab results if they haven't heard anything in the next week.   This note has been created with Surveyor, quantity. Any transcriptional errors are unintentional.   Maren Reamer, MD, [place] [facility] [place], [place]   10/10/2015, 10:11 AM

## 2015-10-12 ENCOUNTER — Telehealth: Payer: Self-pay

## 2015-10-12 ENCOUNTER — Ambulatory Visit: Payer: Self-pay | Admitting: Neurology

## 2015-10-12 NOTE — Telephone Encounter (Signed)
Pt no-showed new pt appt this am.

## 2015-10-14 ENCOUNTER — Ambulatory Visit: Payer: Self-pay | Admitting: Surgery

## 2015-10-14 ENCOUNTER — Inpatient Hospital Stay (HOSPITAL_COMMUNITY)
Admission: AD | Admit: 2015-10-14 | Discharge: 2015-10-18 | DRG: 419 | Disposition: A | Discharge status: Home / Self Care | Payer: Medicaid Other | Source: Ambulatory Visit | Attending: Surgery | Admitting: Surgery

## 2015-10-14 ENCOUNTER — Encounter (HOSPITAL_COMMUNITY): Payer: Self-pay | Admitting: General Practice

## 2015-10-14 DIAGNOSIS — J45909 Unspecified asthma, uncomplicated: Secondary | ICD-10-CM | POA: Diagnosis present

## 2015-10-14 DIAGNOSIS — R1011 Right upper quadrant pain: Secondary | ICD-10-CM | POA: Diagnosis present

## 2015-10-14 DIAGNOSIS — K219 Gastro-esophageal reflux disease without esophagitis: Secondary | ICD-10-CM | POA: Diagnosis present

## 2015-10-14 DIAGNOSIS — K8000 Calculus of gallbladder with acute cholecystitis without obstruction: Principal | ICD-10-CM | POA: Diagnosis present

## 2015-10-14 DIAGNOSIS — Z7951 Long term (current) use of inhaled steroids: Secondary | ICD-10-CM | POA: Diagnosis not present

## 2015-10-14 DIAGNOSIS — E876 Hypokalemia: Secondary | ICD-10-CM | POA: Diagnosis not present

## 2015-10-14 DIAGNOSIS — K8043 Calculus of bile duct with acute cholecystitis with obstruction: Secondary | ICD-10-CM | POA: Insufficient documentation

## 2015-10-14 DIAGNOSIS — K8010 Calculus of gallbladder with chronic cholecystitis without obstruction: Secondary | ICD-10-CM

## 2015-10-14 HISTORY — DX: Adverse effect of unspecified anesthetic, initial encounter: T41.45XA

## 2015-10-14 HISTORY — DX: Other complications of anesthesia, initial encounter: T88.59XA

## 2015-10-14 HISTORY — DX: Epilepsy, unspecified, not intractable, without status epilepticus: G40.909

## 2015-10-14 HISTORY — DX: Sleep apnea, unspecified: G47.30

## 2015-10-14 LAB — COMPREHENSIVE METABOLIC PANEL
ALT: 14 U/L (ref 14–54)
AST: 21 U/L (ref 15–41)
Albumin: 4.3 g/dL (ref 3.5–5.0)
Alkaline Phosphatase: 57 U/L (ref 38–126)
Anion gap: 7 (ref 5–15)
BUN: <5 mg/dL — ABNORMAL LOW (ref 6–20)
CO2: 26 mmol/L (ref 22–32)
Calcium: 9.7 mg/dL (ref 8.9–10.3)
Chloride: 105 mmol/L (ref 101–111)
Creatinine, Ser: 0.71 mg/dL (ref 0.44–1.00)
GFR calc Af Amer: >60 mL/min (ref >60)
GFR calc non Af Amer: >60 mL/min (ref >60)
Glucose, Bld: 78 mg/dL (ref 65–99)
Potassium: 3.6 mmol/L (ref 3.5–5.1)
Sodium: 138 mmol/L (ref 135–145)
Total Bilirubin: 0.5 mg/dL (ref 0.3–1.2)
Total Protein: 7.3 g/dL (ref 6.5–8.1)

## 2015-10-14 LAB — CBC
HEMATOCRIT: 43.5 % (ref 36.0–46.0)
HEMOGLOBIN: 14.3 g/dL (ref 12.0–15.0)
MCH: 27.7 pg (ref 26.0–34.0)
MCHC: 32.9 g/dL (ref 30.0–36.0)
MCV: 84.3 fL (ref 78.0–100.0)
Platelets: 257 10*3/uL (ref 150–400)
RBC: 5.16 MIL/uL — AB (ref 3.87–5.11)
RDW: 14.8 % (ref 11.5–15.5)
WBC: 10.3 10*3/uL (ref 4.0–10.5)

## 2015-10-14 LAB — LIPASE, BLOOD: Lipase: 30 U/L (ref 11–51)

## 2015-10-14 MED ORDER — HYDROMORPHONE HCL 1 MG/ML IJ SOLN
1.0000 mg | INTRAMUSCULAR | Status: DC | PRN
  Administered 2015-10-14 – 2015-10-15 (×7): 1 mg via INTRAVENOUS
  Filled 2015-10-14 (×7): qty 1

## 2015-10-14 MED ORDER — CIPROFLOXACIN IN D5W 400 MG/200ML IV SOLN
400.0000 mg | Freq: Two times a day (BID) | INTRAVENOUS | Status: DC
  Administered 2015-10-14 – 2015-10-18 (×9): 400 mg via INTRAVENOUS
  Filled 2015-10-14 (×10): qty 200

## 2015-10-14 MED ORDER — SODIUM CHLORIDE 0.9 % IV SOLN: 4.0000 mg | Freq: Four times a day (QID) | INTRAVENOUS | Status: DC | PRN

## 2015-10-14 MED ORDER — DIPHENHYDRAMINE HCL 50 MG/ML IJ SOLN: 25.0000 mg | Freq: Four times a day (QID) | INTRAMUSCULAR | Status: DC | PRN

## 2015-10-14 MED ORDER — CIPROFLOXACIN IN D5W 400 MG/200ML IV SOLN: 400.0000 mg | Freq: Two times a day (BID) | INTRAVENOUS | Status: DC

## 2015-10-14 MED ORDER — HYDROMORPHONE HCL 2 MG/ML IJ SOLN: 1.0000 mg | INTRAMUSCULAR | Status: DC | PRN

## 2015-10-14 MED ORDER — ONDANSETRON HCL 4 MG/2ML IJ SOLN
4.0000 mg | Freq: Four times a day (QID) | INTRAMUSCULAR | Status: DC | PRN
  Administered 2015-10-14 – 2015-10-17 (×4): 4 mg via INTRAVENOUS
  Filled 2015-10-14 (×4): qty 2

## 2015-10-14 MED ORDER — DIPHENHYDRAMINE HCL 50 MG/ML IJ SOLN
25.0000 mg | Freq: Four times a day (QID) | INTRAMUSCULAR | Status: DC | PRN
  Administered 2015-10-14 – 2015-10-16 (×5): 25 mg via INTRAVENOUS
  Administered 2015-10-16: 12.5 mg via INTRAVENOUS
  Administered 2015-10-16 – 2015-10-18 (×3): 25 mg via INTRAVENOUS
  Filled 2015-10-14 (×10): qty 1

## 2015-10-14 MED ORDER — SODIUM CHLORIDE 0.9 % IV SOLN
INTRAVENOUS | Status: DC
  Administered 2015-10-14 – 2015-10-16 (×4): via INTRAVENOUS

## 2015-10-14 MED ORDER — DIPHENHYDRAMINE HCL 25 MG PO CAPS: 25.0000 mg | ORAL_CAPSULE | Freq: Four times a day (QID) | ORAL | Status: DC | PRN

## 2015-10-14 MED ORDER — ONDANSETRON 4 MG PO TBDP: 4.0000 mg | ORAL_TABLET | Freq: Four times a day (QID) | ORAL | Status: DC | PRN

## 2015-10-14 MED ORDER — SODIUM CHLORIDE 0.9 % IV SOLN
INTRAVENOUS | Status: DC
Start: 2015-10-14 — End: 2015-10-14

## 2015-10-14 NOTE — Progress Notes (Signed)
Pt arrived to [id number]  From doctors office. C/o  abd pain/ nausea, oriented to room and surroundings. Parents at bedside

## 2015-10-14 NOTE — H&P (Signed)
Jasmine [name] is an 26 y.o. female.   Chief Complaint: RUQ abdominal pain, nausea, vomiting, diarrhea HPI: This is a 25 yo female who presents with a 5 month history of episodic right upper quadrant abdominal pain.  This tended to occur after eating.  Over the last several w these episodes have become more frequent and more severe.  Over the last 2 days, she has developed fairly severe nausea, vomiting, and diarrhea.  Her bowel movements have become clay-colored.  Her urine is much darker.  Her mother notices that the whites of her eyes are darker than usual.  She is in significant distress in our office.  She was referred for elective cholecystectomy but appears much more ill than she was at her PCP's office.  Past Medical History:  Diagnosis Date  . Asthma   . GERD (gastroesophageal reflux disease)   . Heart murmur    last work up age 71- no symtoms  . Pancreatitis   . 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 Sickle cell trait [facility])     Past Surgical History:  Procedure Laterality Date  . APPENDECTOMY    . CESAREAN SECTION     x 1  . CESAREAN SECTION N/A 11/08/2014   Procedure: CESAREAN SECTION;  Surgeon: Truett Mainland, DO;  Location: [facility];  Service: Obstetrics;  Laterality: N/A;  . HAND SURGERY    . HERNIA REPAIR    . NERVE, TENDON AND ARTERY REPAIR Right 09/23/2012   Procedure: I&D and Repair As Necessary/Right Hand and Palm;  Surgeon: Roseanne Kaufman, MD;  Location: [facility];  Service: Orthopedics;  Laterality: Right;  . TONSILLECTOMY      Family History  Problem Relation Age of Onset  . Cancer Mother     cervical cancer  . Asthma Mother   . Hypertension Father   . Sickle cell anemia Father   . Asthma Sister   . Diabetes Maternal Aunt   . Cancer Maternal Grandmother    Social History:  reports that she quit smoking about 18 months ago. Her smoking use included Cigarettes. She has never used smokeless tobacco. She reports that she does not drink alcohol or use drugs.  Allergies:   Allergies  Allergen Reactions  . Cheese Shortness Of Breath    Asthma trigger  . Chocolate Shortness Of Breath    Asthma trigger  . Ibuprofen Hives and Shortness Of Breath    Tolerates naproxen  . Ivp Dye [Iodinated Diagnostic Agents] Shortness Of Breath  . Latex Anaphylaxis and Swelling    Swelling wherever skin is touched  . Orange Juice [Orange Oil] Shortness Of Breath  . Other Hives    ALLERGIC TO ALL STEROIDS       EXCEPT IV SOLU MEDROL  . Peach [Prunus Persica] Anaphylaxis  . Peanuts [Peanut Oil] Anaphylaxis  . Pear Anaphylaxis  . Prednisone Hives    Pt took prednisone / IV steroids 04/2015 admission without difficulty  . Raspberry Anaphylaxis  . Tylenol [Acetaminophen] Hives and Shortness Of Breath  . Amoxicillin Hives    Has patient had a PCN reaction causing immediate rash, facial/tongue/throat swelling, SOB or lightheadedness with hypotension:Yes Has patient had a PCN reaction causing severe rash involving mucus membranes or skin necrosis:No Has patient had a PCN reaction that required hospitalization:no Has patient had a PCN reaction occurring within the last 10 years:No If all of the above answers are "NO", then may proceed with Cephalosporin use.     Marland Kitchen Apricot Flavor Hives  .  Doxycycline Hives  . Erythromycin Hives  . Milk Of Magnesia [Magnesium Hydroxide] Hives and Itching  . Penicillins Hives    Has patient had a PCN reaction causing immediate rash, facial/tongue/throat swelling, SOB or lightheadedness with hypotension: Yes Has patient had a PCN reaction causing severe rash involving mucus membranes or skin necrosis: No Has patient had a PCN reaction that required hospitalization Yes Has patient had a PCN reaction occurring within the last 10 years: No If all of the above answers are "NO", then may proceed with Cephalosporin use.     Prior to Admission medications   Medication Sig Start Date End Date Taking? Authorizing Provider  albuterol (PROVENTIL  HFA;VENTOLIN HFA) 108 (90 Base) MCG/ACT inhaler Inhale 2 puffs into the lungs every 4 (four) hours as needed for wheezing or shortness of breath. 09/22/15   Maren Reamer, MD  albuterol (PROVENTIL) (2.5 MG/3ML) 0.083% nebulizer solution Take 3 mLs (2.5 mg total) by nebulization every 4 (four) hours as needed for wheezing or shortness of breath. 09/22/15   Maren Reamer, MD  budesonide-formoterol (SYMBICORT) 160-4.5 MCG/ACT inhaler Inhale 2 puffs into the lungs 2 (two) times daily. 09/22/15   Maren Reamer, MD  cyclobenzaprine (FLEXERIL) 10 MG tablet Take 1 tablet (10 mg total) by mouth 3 (three) times daily as needed for muscle spasms (or pain). 08/07/15   Clayton Bibles, PA-C  dicyclomine (BENTYL) 10 MG capsule Take 1 capsule (10 mg total) by mouth 4 (four) times daily -  before meals and at bedtime. 10/10/15   Maren Reamer, MD  EPINEPHrine (ADRENALIN) 1 MG/ML injection Inject 1 mL (1 mg total) into the muscle once. Patient taking differently: Inject 1 mg into the muscle once as needed for anaphylaxis (severe allergic reation).  06/10/14   Nila Nephew, MD  fluticasone (FLONASE) 50 MCG/ACT nasal spray Place 2 sprays into both nostrils daily. 09/22/15   Maren Reamer, MD  levETIRAcetam (KEPPRA) 500 MG tablet Take 1 tablet (500 mg total) by mouth 2 (two) times daily. 09/22/15   Maren Reamer, MD  levofloxacin (LEVAQUIN) 500 MG tablet Take 1 tablet (500 mg total) by mouth daily. 09/22/15   Maren Reamer, MD  montelukast (SINGULAIR) 10 MG tablet Take 1 tablet (10 mg total) by mouth at bedtime. 09/22/15   Maren Reamer, MD  OLANZapine (ZYPREXA) 5 MG tablet Take 1 tablet (5 mg total) by mouth at bedtime. 09/22/15   Maren Reamer, MD  pantoprazole (PROTONIX) 40 MG tablet Take 1 tablet (40 mg total) by mouth daily. 09/22/15   Maren Reamer, MD  sucralfate (CARAFATE) 1 GM/10ML suspension Take 10 mLs (1 g total) by mouth 4 (four) times daily -  with meals and at bedtime. 09/22/15   Maren Reamer, MD    US Abdomen Limited Ruq  Result Date: 10/03/2015 CLINICAL DATA:  Chronic epigastric pain. EXAM: US ABDOMEN LIMITED - RIGHT UPPER QUADRANT COMPARISON:  CT 04/21/2015 FINDINGS: Gallbladder: Multiple gallstones noted within the gallbladder. No wall thickening. Negative sonographic Murphy's. Common bile duct: Diameter: Normal caliber, 2-3 mm. Liver: No focal lesion identified. Within normal limits in parenchymal echogenicity. IMPRESSION: Cholelithiasis.  No sonographic evidence of acute cholecystitis. Electronically Signed   By: Rolm Baptise M.D.   On: 10/03/2015 09:03    Review of Systems  Constitutional: Positive for fever. Negative for weight loss.  HENT: Negative for ear discharge, ear pain, hearing loss and tinnitus.   Eyes: Negative for blurred vision, double vision, photophobia and  pain.  Respiratory: Negative for cough, sputum production and shortness of breath.   Cardiovascular: Negative for chest pain.  Gastrointestinal: Positive for abdominal pain, diarrhea, nausea and vomiting.  Genitourinary: Negative for dysuria, flank pain, frequency and urgency.  Musculoskeletal: Negative for back pain, falls, joint pain, myalgias and neck pain.  Neurological: Negative for dizziness, tingling, sensory change, focal weakness, loss of consciousness and headaches.  Endo/Heme/Allergies: Does not bruise/bleed easily.  Psychiatric/Behavioral: Negative for depression, memory loss and substance abuse. The patient is not nervous/anxious.     Last menstrual period 08/31/2015, currently breastfeeding. Physical Exam  WDWN - very uncomfortable Eyes:  Pupils equal, round; sclera mild icterus HENT:  Oral mucosa moist; good dentition  Neck:  No masses palpated, no thyromegaly Lungs:  CTA bilaterally; normal respiratory effort CV:  Regular rate and rhythm; no murmurs; extremities well-perfused with no edema Abd:  +bowel sounds, soft, tender in RUQ; no organomegaly; no palpable masses Skin:  Warm, dry; no  sign of jaundice Psychiatric - alert and oriented x 4; calm mood and affect  Assessment/Plan Acute cholecystitis Probable choledocholithiasis with obstruction/ possible pancreatitis  Direct to hospital - labs May need MRCP or GI/ ERCP Will need lap chole probably this admission.   Maia Petties., MD 10/14/2015, 12:10 PM

## 2015-10-15 MED ORDER — HYDROMORPHONE HCL 1 MG/ML IJ SOLN
1.0000 mg | INTRAMUSCULAR | Status: DC | PRN
Start: 2015-10-15 — End: 2015-10-17
  Administered 2015-10-15 – 2015-10-17 (×14): 2 mg via INTRAVENOUS
  Filled 2015-10-15: qty 2
  Filled 2015-10-15: qty 1
  Filled 2015-10-15 (×13): qty 2

## 2015-10-15 MED ORDER — PROMETHAZINE HCL 25 MG/ML IJ SOLN
25.0000 mg | Freq: Four times a day (QID) | INTRAMUSCULAR | Status: DC | PRN
  Administered 2015-10-15 – 2015-10-16 (×4): 25 mg via INTRAVENOUS
  Filled 2015-10-15 (×4): qty 1

## 2015-10-15 NOTE — Progress Notes (Signed)
Patient ID: Jasmine [name], female   DOB: April 28, 1990, 25 y.o.   MRN: [id number]  [facility] Surgery Progress Note     Subjective: Persistent RUQ pain. No emesis but continued to have nausea over night despite antiemetics.  Abdominal surgical history: bilateral hernia repairs, 2 c. Sections, lap appy  Objective: Vital signs in last 24 hours: Temp:  [98.3 F (36.8 C)-98.7 F (37.1 C)] 98.7 F (37.1 C) (09/30 0854) Pulse Rate:  [56-86] 56 (09/30 0854) Resp:  [16-17] 17 (09/30 0854) BP: (99-120)/(41-71) 104/45 (09/30 0854) SpO2:  [100 %] 100 % (09/30 0854) Weight:  [151 lb (68.5 kg)] 151 lb (68.5 kg) (09/29 1300) Last BM Date: 10/13/15  Intake/Output from previous day: 09/29 0701 - 09/30 0700 In: 1150 [I.V.:950; IV Piggyback:200] Out: 300 [Urine:300] Intake/Output this shift: No intake/output data recorded.  PE: Gen:  Alert, uncomfortable Card:  RRR, no M/G/R heard Pulm:  CTAB, no W/R/R Abd: Soft, ND, +BS, tender RUQ  Lab Results:   Recent Labs  10/14/15 1325  WBC 10.3  HGB 14.3  HCT 43.5  PLT 257   BMET  Recent Labs  10/14/15 1325  NA 138  K 3.6  CL 105  CO2 26  GLUCOSE 78  BUN <5*  CREATININE 0.71  CALCIUM 9.7   PT/INR No results for input(s): LABPROT, INR in the last 72 hours. CMP     Component Value Date/Time   NA 138 10/14/2015 1325   K 3.6 10/14/2015 1325   CL 105 10/14/2015 1325   CO2 26 10/14/2015 1325   GLUCOSE 78 10/14/2015 1325   BUN <5 (L) 10/14/2015 1325   CREATININE 0.71 10/14/2015 1325   CALCIUM 9.7 10/14/2015 1325   PROT 7.3 10/14/2015 1325   ALBUMIN 4.3 10/14/2015 1325   AST 21 10/14/2015 1325   ALT 14 10/14/2015 1325   ALKPHOS 57 10/14/2015 1325   BILITOT 0.5 10/14/2015 1325   GFRNONAA >60 10/14/2015 1325   GFRAA >60 10/14/2015 1325   Lipase     Component Value Date/Time   LIPASE 30 10/14/2015 1325       Studies/Results: No results found.  Anti-infectives: Anti-infectives    Start     Dose/Rate Route  Frequency Ordered Stop   10/14/15 1315  ciprofloxacin (CIPRO) IVPB 400 mg     400 mg 200 mL/hr over 60 Minutes Intravenous Every 12 hours 10/14/15 1307         Assessment/Plan RUQ abdominal pain Acute cholecystitis - AST, ALT, bilirubin, alk phos, WBC, lipase all WNL - RUQ u/s 10/03/15 showed cholelithiasis - persistent RUQ abdominal pain  ID - cipro day 2 FEN - NPO, IVF VTE - SCD's  Plan - OR today or tomorrow for laparoscopic cholecystectomy.   LOS: 1 day    Jerrye Beavers , [facility] Surgery 10/15/2015, 9:24 AM Pager: [phone] Consults: [phone] Mon-Fri 7:00 am-4:30 pm Sat-Sun 7:00 am-11:30 am

## 2015-10-15 NOTE — Progress Notes (Signed)
Initial Nutrition Assessment  DOCUMENTATION CODES:   Not applicable  INTERVENTION:   -RD will follow for diet advancement and supplement as appropriate  NUTRITION DIAGNOSIS:   Inadequate oral intake related to altered GI function as evidenced by NPO status.  GOAL:   Patient will meet greater than or equal to 90% of their needs  MONITOR:   Diet advancement, Labs, Weight trends, Skin, I & O's  REASON FOR ASSESSMENT:   Malnutrition Screening Tool    ASSESSMENT:   This is a 25 yo female who presents with a 5 month history of episodic right upper quadrant abdominal pain.  This tended to occur after eating.  Over the last several w these episodes have become more frequent and more severe.  Over the last 2 days, she has developed fairly severe nausea, vomiting, and diarrhea.  Her bowel movements have become clay-colored.  Her urine is much darker.  Her mother notices that the whites of her eyes are darker than usual.  She is in significant distress in our office.  She was referred for elective cholecystectomy but appears much more ill than she was at her PCP's office.  Pt admitted with acute cholecystitis.   Pt sleeping soundly at time of visit and would not arouse for interview. No family members present to provide additional hx. Per discussion with RN, pt was just administered pain medication and has been very drowsy.   Unable to complete Nutrition-Focused physical exam at this time, due to pt being covered in multiple blankets.  Wt hx reviewed. Noted UBW around 165#. Pt has experienced a 22# (12.7%) wt loss over the past year. While wt loss percentage is not significant, is still concerning given hx of poor PO intake.   Case discussed with RN. Plan to remain NPO. Pt will undergo lap chole either later today or tomorrow.   Labs reviewed.   Diet Order:  Diet NPO time specified Except for: Ice Chips  Skin:  Reviewed, no issues  Last BM:  10/13/15  Height:   Ht Readings from  Last 1 Encounters:  10/14/15 5\' 3"  (1.6 m)    Weight:   Wt Readings from Last 1 Encounters:  10/14/15 151 lb (68.5 kg)    Ideal Body Weight:  52.3 kg  BMI:  Body mass index is 26.75 kg/m.  Estimated Nutritional Needs:   Kcal:  1700-1900  Protein:  90-105 grams  Fluid:  1.7-1.9 L  EDUCATION NEEDS:   No education needs identified at this time  Sundy Houchins A. Jimmye Norman, RD, LDN, CDE Pager: [phone] After hours Pager: [phone]

## 2015-10-16 ENCOUNTER — Encounter (HOSPITAL_COMMUNITY): Admission: AD | Disposition: A | Discharge status: Home / Self Care | Payer: Self-pay | Source: Ambulatory Visit

## 2015-10-16 ENCOUNTER — Inpatient Hospital Stay (HOSPITAL_COMMUNITY): Payer: Medicaid Other | Admitting: Certified Registered"

## 2015-10-16 ENCOUNTER — Inpatient Hospital Stay (HOSPITAL_COMMUNITY): Payer: Medicaid Other

## 2015-10-16 ENCOUNTER — Encounter (HOSPITAL_COMMUNITY): Payer: Self-pay | Admitting: Certified Registered Nurse Anesthetist

## 2015-10-16 HISTORY — PX: CHOLECYSTECTOMY: SHX55

## 2015-10-16 LAB — SURGICAL PCR SCREEN
MRSA, PCR: NEGATIVE
STAPHYLOCOCCUS AUREUS: NEGATIVE

## 2015-10-16 SURGERY — LAPAROSCOPIC CHOLECYSTECTOMY WITH INTRAOPERATIVE CHOLANGIOGRAM
Anesthesia: General | Site: Abdomen

## 2015-10-16 MED ORDER — MEPERIDINE HCL 25 MG/ML IJ SOLN: 6.2500 mg | INTRAMUSCULAR | Status: DC | PRN

## 2015-10-16 MED ORDER — LACTATED RINGERS IV SOLN: INTRAVENOUS | Status: DC

## 2015-10-16 MED ORDER — SUCCINYLCHOLINE CHLORIDE 20 MG/ML IJ SOLN
INTRAMUSCULAR | Status: DC | PRN
  Administered 2015-10-16: 110 mg via INTRAVENOUS

## 2015-10-16 MED ORDER — PROPOFOL 10 MG/ML IV BOLUS
INTRAVENOUS | Status: AC
  Filled 2015-10-16: qty 20

## 2015-10-16 MED ORDER — ROCURONIUM BROMIDE 100 MG/10ML IV SOLN
INTRAVENOUS | Status: DC | PRN
  Administered 2015-10-16: 50 mg via INTRAVENOUS

## 2015-10-16 MED ORDER — LACTATED RINGERS IV SOLN
INTRAVENOUS | Status: DC | PRN
  Administered 2015-10-16: 08:00:00 via INTRAVENOUS

## 2015-10-16 MED ORDER — SODIUM CHLORIDE 0.9 % IR SOLN
Status: DC | PRN
  Administered 2015-10-16: 1000 mL

## 2015-10-16 MED ORDER — PROPOFOL 10 MG/ML IV BOLUS
INTRAVENOUS | Status: DC | PRN
  Administered 2015-10-16: 140 mg via INTRAVENOUS

## 2015-10-16 MED ORDER — ONDANSETRON HCL 4 MG/2ML IJ SOLN
INTRAMUSCULAR | Status: DC | PRN
Start: 2015-10-16 — End: 2015-10-16
  Administered 2015-10-16: 4 mg via INTRAVENOUS

## 2015-10-16 MED ORDER — BUPIVACAINE-EPINEPHRINE (PF) 0.25% -1:200000 IJ SOLN
INTRAMUSCULAR | Status: AC
  Filled 2015-10-16: qty 30

## 2015-10-16 MED ORDER — ALBUTEROL SULFATE (2.5 MG/3ML) 0.083% IN NEBU: 2.5000 mg | INHALATION_SOLUTION | RESPIRATORY_TRACT | Status: DC | PRN

## 2015-10-16 MED ORDER — FENTANYL CITRATE (PF) 100 MCG/2ML IJ SOLN
INTRAMUSCULAR | Status: AC
  Administered 2015-10-16: 25 ug via INTRAVENOUS
  Filled 2015-10-16: qty 2

## 2015-10-16 MED ORDER — NEOSTIGMINE METHYLSULFATE 10 MG/10ML IV SOLN
INTRAVENOUS | Status: DC | PRN
  Administered 2015-10-16: 4 mg via INTRAVENOUS

## 2015-10-16 MED ORDER — ONDANSETRON HCL 4 MG/2ML IJ SOLN
INTRAMUSCULAR | Status: AC
  Filled 2015-10-16: qty 2

## 2015-10-16 MED ORDER — GLYCOPYRROLATE 0.2 MG/ML IJ SOLN
INTRAMUSCULAR | Status: DC | PRN
  Administered 2015-10-16: 0.6 mg via INTRAVENOUS

## 2015-10-16 MED ORDER — MOMETASONE FURO-FORMOTEROL FUM 200-5 MCG/ACT IN AERO
2.0000 | INHALATION_SPRAY | Freq: Two times a day (BID) | RESPIRATORY_TRACT | Status: DC
  Administered 2015-10-16 – 2015-10-18 (×4): 2 via RESPIRATORY_TRACT
  Filled 2015-10-16: qty 8.8

## 2015-10-16 MED ORDER — FENTANYL CITRATE (PF) 100 MCG/2ML IJ SOLN
INTRAMUSCULAR | Status: DC | PRN
  Administered 2015-10-16 (×2): 50 ug via INTRAVENOUS

## 2015-10-16 MED ORDER — 0.9 % SODIUM CHLORIDE (POUR BTL) OPTIME
TOPICAL | Status: DC | PRN
  Administered 2015-10-16: 1000 mL

## 2015-10-16 MED ORDER — LIDOCAINE HCL (CARDIAC) 20 MG/ML IV SOLN
INTRAVENOUS | Status: DC | PRN
  Administered 2015-10-16: 60 mg via INTRAVENOUS

## 2015-10-16 MED ORDER — SODIUM CHLORIDE 0.9 % IV SOLN
INTRAVENOUS | Status: DC | PRN
  Administered 2015-10-16: 2.5 mL

## 2015-10-16 MED ORDER — FENTANYL CITRATE (PF) 100 MCG/2ML IJ SOLN
INTRAMUSCULAR | Status: AC
  Filled 2015-10-16: qty 4

## 2015-10-16 MED ORDER — PROMETHAZINE HCL 25 MG/ML IJ SOLN: 6.2500 mg | INTRAMUSCULAR | Status: DC | PRN

## 2015-10-16 MED ORDER — MIDAZOLAM HCL 5 MG/5ML IJ SOLN
INTRAMUSCULAR | Status: DC | PRN
  Administered 2015-10-16: 2 mg via INTRAVENOUS

## 2015-10-16 MED ORDER — GLYCOPYRROLATE 0.2 MG/ML IV SOSY
PREFILLED_SYRINGE | INTRAVENOUS | Status: AC
  Filled 2015-10-16: qty 3

## 2015-10-16 MED ORDER — NEOSTIGMINE METHYLSULFATE 5 MG/5ML IV SOSY
PREFILLED_SYRINGE | INTRAVENOUS | Status: AC
  Filled 2015-10-16: qty 5

## 2015-10-16 MED ORDER — IOPAMIDOL (ISOVUE-300) INJECTION 61%
INTRAVENOUS | Status: AC
  Filled 2015-10-16: qty 50

## 2015-10-16 MED ORDER — OXYCODONE HCL 5 MG PO TABS
5.0000 mg | ORAL_TABLET | ORAL | Status: DC | PRN
  Administered 2015-10-17 – 2015-10-18 (×2): 5 mg via ORAL
  Filled 2015-10-16 (×2): qty 1

## 2015-10-16 MED ORDER — BUPIVACAINE-EPINEPHRINE 0.25% -1:200000 IJ SOLN
INTRAMUSCULAR | Status: DC | PRN
  Administered 2015-10-16: 7 mL

## 2015-10-16 MED ORDER — FENTANYL CITRATE (PF) 100 MCG/2ML IJ SOLN
25.0000 ug | INTRAMUSCULAR | Status: DC | PRN
  Administered 2015-10-16: 25 ug via INTRAVENOUS

## 2015-10-16 MED ORDER — MIDAZOLAM HCL 2 MG/2ML IJ SOLN
INTRAMUSCULAR | Status: AC
  Filled 2015-10-16: qty 2

## 2015-10-16 SURGICAL SUPPLY — 47 items
APPLIER CLIP ROT 10 11.4 M/L (STAPLE) ×3
BENZOIN TINCTURE PRP APPL 2/3 (GAUZE/BANDAGES/DRESSINGS) IMPLANT
BLADE SURG ROTATE 9660 (MISCELLANEOUS) ×3 IMPLANT
CANISTER SUCTION 2500CC (MISCELLANEOUS) ×3 IMPLANT
CHLORAPREP W/TINT 26ML (MISCELLANEOUS) ×3 IMPLANT
CLIP APPLIE ROT 10 11.4 M/L (STAPLE) ×1 IMPLANT
CLOSURE WOUND 1/2 X4 (GAUZE/BANDAGES/DRESSINGS) ×1
COVER MAYO STAND STRL (DRAPES) ×3 IMPLANT
COVER SURGICAL LIGHT HANDLE (MISCELLANEOUS) ×3 IMPLANT
DRAPE C-ARM 42X72 X-RAY (DRAPES) ×3 IMPLANT
DRSG TEGADERM 2-3/8X2-3/4 SM (GAUZE/BANDAGES/DRESSINGS) ×9 IMPLANT
DRSG TEGADERM 4X4.75 (GAUZE/BANDAGES/DRESSINGS) ×3 IMPLANT
ELECT REM PT RETURN 9FT ADLT (ELECTROSURGICAL) ×3
ELECTRODE REM PT RTRN 9FT ADLT (ELECTROSURGICAL) ×1 IMPLANT
FILTER SMOKE EVAC LAPAROSHD (FILTER) IMPLANT
GAUZE SPONGE 2X2 8PLY STRL LF (GAUZE/BANDAGES/DRESSINGS) ×1 IMPLANT
GLOVE BIO SURGEON STRL SZ7 (GLOVE) IMPLANT
GLOVE BIOGEL PI IND STRL 7.5 (GLOVE) ×1 IMPLANT
GLOVE BIOGEL PI INDICATOR 7.5 (GLOVE) ×2
GLOVE SKINSENSE NS SZ6.5 (GLOVE) ×6
GLOVE SKINSENSE NS SZ7.0 (GLOVE) ×4
GLOVE SKINSENSE NS SZ7.5 (GLOVE) ×2
GLOVE SKINSENSE STRL SZ6.5 (GLOVE) ×3 IMPLANT
GLOVE SKINSENSE STRL SZ7.0 (GLOVE) ×2 IMPLANT
GLOVE SKINSENSE STRL SZ7.5 (GLOVE) ×1 IMPLANT
GOWN STRL REUS W/ TWL LRG LVL3 (GOWN DISPOSABLE) ×3 IMPLANT
GOWN STRL REUS W/TWL LRG LVL3 (GOWN DISPOSABLE) ×6
KIT BASIN OR (CUSTOM PROCEDURE TRAY) ×3 IMPLANT
KIT ROOM TURNOVER OR (KITS) ×3 IMPLANT
NS IRRIG 1000ML POUR BTL (IV SOLUTION) ×3 IMPLANT
PAD ARMBOARD 7.5X6 YLW CONV (MISCELLANEOUS) ×3 IMPLANT
POUCH SPECIMEN RETRIEVAL 10MM (ENDOMECHANICALS) ×3 IMPLANT
SCISSORS LAP 5X35 DISP (ENDOMECHANICALS) ×3 IMPLANT
SET CHOLANGIOGRAPH 5 50 .035 (SET/KITS/TRAYS/PACK) ×3 IMPLANT
SET IRRIG TUBING LAPAROSCOPIC (IRRIGATION / IRRIGATOR) ×3 IMPLANT
SLEEVE ENDOPATH XCEL 5M (ENDOMECHANICALS) ×3 IMPLANT
SPECIMEN JAR SMALL (MISCELLANEOUS) ×3 IMPLANT
SPONGE GAUZE 2X2 STER 10/PKG (GAUZE/BANDAGES/DRESSINGS) ×2
STRIP CLOSURE SKIN 1/2X4 (GAUZE/BANDAGES/DRESSINGS) ×2 IMPLANT
SUT MNCRL AB 4-0 PS2 18 (SUTURE) ×3 IMPLANT
TOWEL OR 17X24 6PK STRL BLUE (TOWEL DISPOSABLE) ×3 IMPLANT
TOWEL OR 17X26 10 PK STRL BLUE (TOWEL DISPOSABLE) ×3 IMPLANT
TRAY LAPAROSCOPIC MC (CUSTOM PROCEDURE TRAY) ×3 IMPLANT
TROCAR XCEL BLUNT TIP 100MML (ENDOMECHANICALS) ×3 IMPLANT
TROCAR XCEL NON-BLD 11X100MML (ENDOMECHANICALS) ×3 IMPLANT
TROCAR XCEL NON-BLD 5MMX100MML (ENDOMECHANICALS) ×3 IMPLANT
TUBING INSUFFLATION (TUBING) ×3 IMPLANT

## 2015-10-16 NOTE — Op Note (Signed)
Laparoscopic Cholecystectomy with IOC Procedure Note  Indications: This patient presents with symptomatic gallbladder disease and will undergo laparoscopic cholecystectomy.  Pre-operative Diagnosis: Calculus of gallbladder with acute cholecystitis, without mention of obstruction  Post-operative Diagnosis: Same  Surgeon: Alysia Scism K.   Assistants: none  Anesthesia: General endotracheal anesthesia  ASA Class: 1  Procedure Details  The patient was seen again in the Holding Room. The risks, benefits, complications, treatment options, and expected outcomes were discussed with the patient. The possibilities of reaction to medication, pulmonary aspiration, perforation of viscus, bleeding, recurrent infection, finding a normal gallbladder, the need for additional procedures, failure to diagnose a condition, the possible need to convert to an open procedure, and creating a complication requiring transfusion or operation were discussed with the patient. The likelihood of improving the patient's symptoms with return to their baseline status is good.  The patient and/or family concurred with the proposed plan, giving informed consent. The site of surgery properly noted. The patient was taken to Operating Room, identified as Jasmine [name] and the procedure verified as Laparoscopic Cholecystectomy with Intraoperative Cholangiogram. A Time Out was held and the above information confirmed.  Prior to the induction of general anesthesia, antibiotic prophylaxis was administered. General endotracheal anesthesia was then administered and tolerated well. After the induction, the abdomen was prepped with Chloraprep and draped in the sterile fashion. The patient was positioned in the supine position.  Local anesthetic agent was injected into the skin near the umbilicus and an incision made. We dissected down to the abdominal fascia with blunt dissection.  The fascia was incised vertically and we entered the  peritoneal cavity bluntly.  A pursestring suture of 0-Vicryl was placed around the fascial opening.  The Hasson cannula was inserted and secured with the stay suture.  Pneumoperitoneum was then created with CO2 and tolerated well without any adverse changes in the patient's vital signs. An 11-mm port was placed in the subxiphoid position.  Two 5-mm ports were placed in the right upper quadrant. All skin incisions were infiltrated with a local anesthetic agent before making the incision and placing the trocars.  The patient has omental adhesions in the lower part of abdomen, likely from her previous c-section.   We positioned the patient in reverse Trendelenburg, tilted slightly to the patient's left.  The gallbladder was identified, the fundus grasped and retracted cephalad. Adhesions were lysed bluntly and with the electrocautery where indicated, taking care not to injure any adjacent organs or viscus.  The gallbladder is moderately inflamed and distended. The infundibulum was grasped and retracted laterally, exposing the peritoneum overlying the triangle of Calot. This was then divided and exposed in a blunt fashion. A critical view of the cystic duct and cystic artery was obtained.  The cystic duct was clearly identified and bluntly dissected circumferentially. The cystic duct was ligated with a clip distally.   An incision was made in the cystic duct and the [facility] cholangiogram catheter introduced. The catheter was secured using a clip. A cholangiogram was then obtained which showed good visualization of the distal and proximal biliary tree with no sign of filling defects or obstruction.  Contrast flowed easily into the duodenum. The catheter was then removed.   The cystic duct was then ligated with clips and divided. The cystic artery was identified, dissected free, ligated with clips and divided as well.   The gallbladder was dissected from the liver bed in retrograde fashion with the electrocautery. The  gallbladder was removed and placed in an Endocatch  sac. The liver bed was irrigated and inspected. Hemostasis was achieved with the electrocautery. Copious irrigation was utilized and was repeatedly aspirated until clear.  The gallbladder and Endocatch sac were then removed through the umbilical port site.  The pursestring suture was used to close the umbilical fascia.    We again inspected the right upper quadrant for hemostasis.  Pneumoperitoneum was released as we removed the trocars.  4-0 Monocryl was used to close the skin.   Benzoin, steri-strips, and clean dressings were applied. The patient was then extubated and brought to the recovery room in stable condition. Instrument, sponge, and needle counts were correct at closure and at the conclusion of the case.   Findings: Cholecystitis with Cholelithiasis  Estimated Blood Loss: Minimal         Drains: none         Specimens: Gallbladder           Complications: None; patient tolerated the procedure well.         Disposition: PACU - hemodynamically stable.         Condition: stable  Imogene Burn. Georgette Dover, MD, [facility] Surgery  General/ Trauma Surgery  10/16/2015 10:12 AM

## 2015-10-16 NOTE — Anesthesia Preprocedure Evaluation (Addendum)
Anesthesia Evaluation  Patient identified by MRN, date of birth, ID band Patient awake    Reviewed: Allergy & Precautions, NPO status , Patient's Chart, lab work & pertinent test results  Airway Mallampati: I  TM Distance: >3 FB Neck ROM: Full    Dental  (+) Poor Dentition, Dental Advisory Given, Chipped,    Pulmonary asthma , sleep apnea , former smoker,    breath sounds clear to auscultation       Cardiovascular negative cardio ROS   Rhythm:Regular Rate:Normal     Neuro/Psych Seizures -,  PSYCHIATRIC DISORDERS    GI/Hepatic Neg liver ROS, GERD  Medicated,  Endo/Other  negative endocrine ROS  Renal/GU negative Renal ROS  negative genitourinary   Musculoskeletal negative musculoskeletal ROS (+)   Abdominal   Peds negative pediatric ROS (+)  Hematology  (+) Sickle cell trait ,   Anesthesia Other Findings   Reproductive/Obstetrics negative OB ROS                            Lab Results  Component Value Date   WBC 10.3 10/14/2015   HGB 14.3 10/14/2015   HCT 43.5 10/14/2015   MCV 84.3 10/14/2015   PLT 257 10/14/2015   Lab Results  Component Value Date   CREATININE 0.71 10/14/2015   BUN <5 (L) 10/14/2015   NA 138 10/14/2015   K 3.6 10/14/2015   CL 105 10/14/2015   CO2 26 10/14/2015   Lab Results  Component Value Date   INR 1.15 04/19/2015   INR 1.03 07/04/2014   INR 1.08 04/08/2014   04/2015 EKG: normal sinus rhyth.   Anesthesia Physical Anesthesia Plan  ASA: II  Anesthesia Plan: General   Post-op Pain Management:    Induction: Intravenous  Airway Management Planned: Oral ETT  Additional Equipment:   Intra-op Plan:   Post-operative Plan: Extubation in OR  Informed Consent: I have reviewed the patients History and Physical, chart, labs and discussed the procedure including the risks, benefits and alternatives for the proposed anesthesia with the patient or  authorized representative who has indicated his/her understanding and acceptance.   Dental advisory given  Plan Discussed with: CRNA  Anesthesia Plan Comments: (Discussed 43 month old daughter, patient denies recent sexual activity.  Smoking cessation discussed with patient and mother. )       Anesthesia Quick Evaluation

## 2015-10-16 NOTE — Transfer of Care (Signed)
Immediate Anesthesia Transfer of Care Note  Patient: Jasmine [name]  Procedure(s) Performed: Procedure(s): LAPAROSCOPIC CHOLECYSTECTOMY WITH POSSIBLE INTRAOPERATIVE CHOLANGIOGRAM (N/A)  Patient Location: PACU  Anesthesia Type:General  Level of Consciousness: awake, alert , oriented and patient cooperative  Airway & Oxygen Therapy: Patient Spontanous Breathing and Patient connected to nasal cannula oxygen  Post-op Assessment: Report given to RN and Post -op Vital signs reviewed and stable  Post vital signs: Reviewed and stable  Last Vitals:  Vitals:   10/15/15 2241 10/16/15 0632  BP: 113/89 (!) 107/52  Pulse: 64 71  Resp: 18 20  Temp: 37.6 C 37.3 C    Last Pain:  Vitals:   10/16/15 0632  TempSrc: Oral  PainSc:       Patients Stated Pain Goal: 2 ([id number])  Complications: No apparent anesthesia complications

## 2015-10-16 NOTE — Anesthesia Procedure Notes (Addendum)
Procedure Name: Intubation Date/Time: 10/16/2015 8:30 AM Performed by: Willeen Cass P Pre-anesthesia Checklist: Patient identified, Emergency Drugs available, Suction available and Patient being monitored Patient Re-evaluated:Patient Re-evaluated prior to inductionOxygen Delivery Method: Circle System Utilized Preoxygenation: Pre-oxygenation with 100% oxygen Intubation Type: IV induction, Rapid sequence and Cricoid Pressure applied Laryngoscope Size: Mac and 3 Grade View: Grade I Tube type: Oral Tube size: 7.5 mm Number of attempts: 1 Airway Equipment and Method: Stylet and Oral airway Placement Confirmation: ETT inserted through vocal cords under direct vision,  positive ETCO2 and breath sounds checked- equal and bilateral Secured at: 22 cm Tube secured with: Tape Dental Injury: Teeth and Oropharynx as per pre-operative assessment  Comments: RSI

## 2015-10-16 NOTE — Anesthesia Postprocedure Evaluation (Signed)
Anesthesia Post Note  Patient: Jasmine [name]  Procedure(s) Performed: Procedure(s) (LRB): LAPAROSCOPIC CHOLECYSTECTOMY WITH POSSIBLE INTRAOPERATIVE CHOLANGIOGRAM (N/A)  Patient location during evaluation: PACU Anesthesia Type: General Level of consciousness: awake and alert Pain management: pain level controlled Vital Signs Assessment: post-procedure vital signs reviewed and stable Respiratory status: spontaneous breathing, nonlabored ventilation, respiratory function stable and patient connected to nasal cannula oxygen Cardiovascular status: blood pressure returned to baseline and stable Postop Assessment: no signs of nausea or vomiting Anesthetic complications: no    Last Vitals:  Vitals:   10/16/15 1115 10/16/15 1130  BP: (!) 125/59 (!) 130/59  Pulse:    Resp:    Temp:  37.3 C    Last Pain:  Vitals:   10/16/15 1130  TempSrc:   PainSc: [name]

## 2015-10-16 NOTE — Progress Notes (Signed)
Day of Surgery  Subjective: Still with some nausea, considerable RUQ pain  Objective: Vital signs in last 24 hours: Temp:  [98.7 F (37.1 C)-99.7 F (37.6 C)] 99.1 F (37.3 C) (10/01 [id number]) Pulse Rate:  [56-71] 71 (10/01 0632) Resp:  [17-20] 20 (10/01 [id number]) BP: (104-113)/(45-89) 107/52 (10/01 [id number]) SpO2:  [100 %] 100 % (10/01 [id number]) Last BM Date: 10/13/15  Intake/Output from previous day: 09/30 0701 - 10/01 0700 In: 1135 [I.V.:935; IV Piggyback:200] Out: 400 [Urine:400] Intake/Output this shift: No intake/output data recorded.  General appearance: alert, cooperative and no distress Resp: clear to auscultation bilaterally Cardio: regular rate and rhythm, S1, S2 normal, no murmur, click, rub or gallop GI: tender in RUQ; no masses palpated  Lab Results:   Recent Labs  10/14/15 1325  WBC 10.3  HGB 14.3  HCT 43.5  PLT 257   BMET  Recent Labs  10/14/15 1325  NA 138  K 3.6  CL 105  CO2 26  GLUCOSE 78  BUN <5*  CREATININE 0.71  CALCIUM 9.7   PT/INR No results for input(s): LABPROT, INR in the last 72 hours. ABG No results for input(s): PHART, HCO3 in the last 72 hours.  Invalid input(s): PCO2, PO2  Studies/Results: No results found.  Anti-infectives: Anti-infectives    Start     Dose/Rate Route Frequency Ordered Stop   10/14/15 1315  ciprofloxacin (CIPRO) IVPB 400 mg     400 mg 200 mL/hr over 60 Minutes Intravenous Every 12 hours 10/14/15 1307        Assessment/Plan: s/p Procedure(s): LAPAROSCOPIC CHOLECYSTECTOMY WITH POSSIBLE INTRAOPERATIVE CHOLANGIOGRAM (N/A) Plan surgery today.  Discussed again with patient and family.  LOS: 2 days    Berish Bohman K. 10/16/2015

## 2015-10-17 ENCOUNTER — Encounter (HOSPITAL_COMMUNITY): Payer: Self-pay | Admitting: Surgery

## 2015-10-17 LAB — COMPREHENSIVE METABOLIC PANEL
ALK PHOS: 39 U/L (ref 38–126)
ALT: 70 U/L — ABNORMAL HIGH (ref 14–54)
ANION GAP: 6 (ref 5–15)
AST: 60 U/L — ABNORMAL HIGH (ref 15–41)
Albumin: 3.1 g/dL — ABNORMAL LOW (ref 3.5–5.0)
BILIRUBIN TOTAL: 0.7 mg/dL (ref 0.3–1.2)
BUN: <5 mg/dL — ABNORMAL LOW (ref 6–20)
CALCIUM: 8.5 mg/dL — AB (ref 8.9–10.3)
CO2: 25 mmol/L (ref 22–32)
Chloride: 110 mmol/L (ref 101–111)
Creatinine, Ser: 0.69 mg/dL (ref 0.44–1.00)
GFR calc non Af Amer: >60 mL/min (ref >60)
Glucose, Bld: 98 mg/dL (ref 65–99)
Potassium: 2.8 mmol/L — ABNORMAL LOW (ref 3.5–5.1)
Sodium: 141 mmol/L (ref 135–145)
TOTAL PROTEIN: 5.2 g/dL — AB (ref 6.5–8.1)

## 2015-10-17 LAB — CBC
HEMATOCRIT: 33.5 % — AB (ref 36.0–46.0)
Hemoglobin: 10.6 g/dL — ABNORMAL LOW (ref 12.0–15.0)
MCH: 27.3 pg (ref 26.0–34.0)
MCHC: 31.6 g/dL (ref 30.0–36.0)
MCV: 86.3 fL (ref 78.0–100.0)
Platelets: 190 10*3/uL (ref 150–400)
RBC: 3.88 MIL/uL (ref 3.87–5.11)
RDW: 15.3 % (ref 11.5–15.5)
WBC: 10.5 10*3/uL (ref 4.0–10.5)

## 2015-10-17 MED ORDER — SODIUM CHLORIDE 0.9 % IV SOLN
INTRAVENOUS | Status: DC
  Administered 2015-10-17: 17:00:00 via INTRAVENOUS
  Filled 2015-10-17 (×3): qty 1000

## 2015-10-17 MED ORDER — KETOROLAC TROMETHAMINE 15 MG/ML IJ SOLN
15.0000 mg | Freq: Four times a day (QID) | INTRAMUSCULAR | Status: DC | PRN
  Administered 2015-10-18 (×2): 15 mg via INTRAVENOUS
  Filled 2015-10-17 (×2): qty 1

## 2015-10-17 MED ORDER — SODIUM CHLORIDE 0.9 % IV BOLUS (SEPSIS)
1000.0000 mL | Freq: Once | INTRAVENOUS | Status: AC
  Administered 2015-10-17: 1000 mL via INTRAVENOUS

## 2015-10-17 MED ORDER — POTASSIUM CHLORIDE 10 MEQ/100ML IV SOLN
10.0000 meq | INTRAVENOUS | Status: AC
  Administered 2015-10-17 (×3): 10 meq via INTRAVENOUS
  Filled 2015-10-17: qty 100

## 2015-10-17 MED ORDER — HYDROMORPHONE HCL 1 MG/ML IJ SOLN
0.5000 mg | INTRAMUSCULAR | Status: DC | PRN
  Administered 2015-10-17 – 2015-10-18 (×7): 1 mg via INTRAVENOUS
  Filled 2015-10-17 (×6): qty 1

## 2015-10-17 MED ORDER — ENOXAPARIN SODIUM 40 MG/0.4ML ~~LOC~~ SOLN
40.0000 mg | SUBCUTANEOUS | Status: DC
  Administered 2015-10-17 – 2015-10-18 (×2): 40 mg via SUBCUTANEOUS
  Filled 2015-10-17 (×2): qty 0.4

## 2015-10-17 MED ORDER — PROMETHAZINE HCL 25 MG/ML IJ SOLN: 12.5000 mg | Freq: Four times a day (QID) | INTRAMUSCULAR | Status: DC | PRN

## 2015-10-17 NOTE — Progress Notes (Signed)
Patient ID: Jasmine [name], female   DOB: 07-10-1990, 25 y.o.   MRN: [id number]  [facility] Surgery Progress Note  1 Day Post-Op  Subjective: Reports persistent, severe abdominal pain. Tried PO pain medication last night but states that she threw it up (not witnessed). Not tolerating anything by mouth. Not ambulating due to pain. No flatus. TMAX 101 over night. Not using IS or ambulating. Sister in room states that she will work with Jasmine [name] today and try to motivate her.  Objective: Vital signs in last 24 hours: Temp:  [97.5 F (36.4 C)-101 F (38.3 C)] 101 F (38.3 C) (10/02 0511) Pulse Rate:  [74-99] 99 (10/02 0511) Resp:  [11-19] 19 (10/02 0511) BP: (101-142)/(47-82) 115/60 (10/02 0511) SpO2:  [98 %-100 %] 100 % (10/02 0511) Last BM Date: 10/13/15  Intake/Output from previous day: 10/01 0701 - 10/02 0700 In: 2073.3 [P.O.:120; I.V.:1553.3; IV Piggyback:400] Out: 1910 [Urine:1900; Blood:10] Intake/Output this shift: No intake/output data recorded.  PE: Gen:  Alert, NAD, pleasant Card:  RRR, no M/G/R heard Pulm:  CTAB, no W/R/R Abd: Soft, ND, moderately tender globally, +BS, incisions covered with clean/dry dressings  Lab Results:   Recent Labs  10/14/15 1325  WBC 10.3  HGB 14.3  HCT 43.5  PLT 257   BMET  Recent Labs  10/14/15 1325  NA 138  K 3.6  CL 105  CO2 26  GLUCOSE 78  BUN <5*  CREATININE 0.71  CALCIUM 9.7   PT/INR No results for input(s): LABPROT, INR in the last 72 hours. CMP     Component Value Date/Time   NA 138 10/14/2015 1325   K 3.6 10/14/2015 1325   CL 105 10/14/2015 1325   CO2 26 10/14/2015 1325   GLUCOSE 78 10/14/2015 1325   BUN <5 (L) 10/14/2015 1325   CREATININE 0.71 10/14/2015 1325   CALCIUM 9.7 10/14/2015 1325   PROT 7.3 10/14/2015 1325   ALBUMIN 4.3 10/14/2015 1325   AST 21 10/14/2015 1325   ALT 14 10/14/2015 1325   ALKPHOS 57 10/14/2015 1325   BILITOT 0.5 10/14/2015 1325   GFRNONAA >60 10/14/2015 1325   GFRAA  >60 10/14/2015 1325   Lipase     Component Value Date/Time   LIPASE 30 10/14/2015 1325       Studies/Results: Dg Cholangiogram Operative  Result Date: 10/16/2015 CLINICAL DATA:  Intraoperative cholangiogram during laparoscopic cholecystectomy. EXAM: INTRAOPERATIVE CHOLANGIOGRAM FLUOROSCOPY TIME:  7.7 seconds COMPARISON:  Right upper quadrant abdominal ultrasound - 10/03/2015; CT abdomen pelvis - 04/21/2015 FINDINGS: Intraoperative cholangiographic images of the right upper abdominal quadrant during laparoscopic cholecystectomy are provided for review. Surgical clips overlie the expected location of the gallbladder fossa. Contrast injection demonstrates selective cannulation of the central aspect of the cystic duct. There is passage of contrast through the central aspect of the cystic duct with filling of a non dilated common bile duct. There is passage of contrast though the CBD and into the descending portion of the duodenum. There is minimal reflux of injected contrast into the common hepatic duct and central aspect of the non dilated intrahepatic biliary system. There are no discrete filling defects within the opacified portions of the biliary system to suggest the presence of choledocholithiasis. IMPRESSION: No evidence of choledocholithiasis. Electronically Signed   By: Sandi Mariscal M.D.   On: 10/16/2015 10:02    Anti-infectives: Anti-infectives    Start     Dose/Rate Route Frequency Ordered Stop   10/14/15 1315  ciprofloxacin (CIPRO) IVPB 400 mg  400 mg 200 mL/hr over 60 Minutes Intravenous Every 12 hours 10/14/15 1307         Assessment/Plan S/p laparoscopic cholecystectomy with IOC 10/16/15 Dr. Georgette Dover - POD 1 - persistent pain, nausea. Not tolerating PO intake or ambulating. No flatus.  ID - cipro day 4 FEN - advance as tolerated VTE - lovenox, SCD's  Plan - add toradol for pain control. Encouraged patient to mobilize and use IS. Will recheck later today to reassess for  possible discharge.   LOS: 3 days    Jerrye Beavers , [facility] Surgery 10/17/2015, 8:36 AM Pager: [phone] Consults: [phone] Mon-Fri 7:00 am-4:30 pm Sat-Sun 7:00 am-11:30 am

## 2015-10-17 NOTE — Discharge Instructions (Signed)

## 2015-10-17 NOTE — Progress Notes (Signed)
3 runs of iv potassium administered today for a level of 2.8. Pt also ambulated one lap on the unit this pm

## 2015-10-18 LAB — CBC
HCT: 32.7 % — ABNORMAL LOW (ref 36.0–46.0)
Hemoglobin: 10.3 g/dL — ABNORMAL LOW (ref 12.0–15.0)
MCH: 27.2 pg (ref 26.0–34.0)
MCHC: 31.5 g/dL (ref 30.0–36.0)
MCV: 86.3 fL (ref 78.0–100.0)
PLATELETS: 172 10*3/uL (ref 150–400)
RBC: 3.79 MIL/uL — AB (ref 3.87–5.11)
RDW: 15.3 % (ref 11.5–15.5)
WBC: 9.9 10*3/uL (ref 4.0–10.5)

## 2015-10-18 LAB — COMPREHENSIVE METABOLIC PANEL
ALK PHOS: 40 U/L (ref 38–126)
ALT: 91 U/L — AB (ref 14–54)
ANION GAP: 7 (ref 5–15)
AST: 70 U/L — ABNORMAL HIGH (ref 15–41)
Albumin: 2.8 g/dL — ABNORMAL LOW (ref 3.5–5.0)
BUN: <5 mg/dL — ABNORMAL LOW (ref 6–20)
CALCIUM: 8.4 mg/dL — AB (ref 8.9–10.3)
CHLORIDE: 108 mmol/L (ref 101–111)
CO2: 24 mmol/L (ref 22–32)
CREATININE: 0.76 mg/dL (ref 0.44–1.00)
Glucose, Bld: 103 mg/dL — ABNORMAL HIGH (ref 65–99)
Potassium: 3.1 mmol/L — ABNORMAL LOW (ref 3.5–5.1)
Sodium: 139 mmol/L (ref 135–145)
Total Bilirubin: 0.7 mg/dL (ref 0.3–1.2)
Total Protein: 5.1 g/dL — ABNORMAL LOW (ref 6.5–8.1)

## 2015-10-18 MED ORDER — POTASSIUM CHLORIDE CRYS ER 20 MEQ PO TBCR
40.0000 meq | EXTENDED_RELEASE_TABLET | ORAL | Status: AC
  Administered 2015-10-18: 40 meq via ORAL
  Filled 2015-10-18: qty 2

## 2015-10-18 MED ORDER — OXYCODONE HCL 5 MG PO TABS: 5.0000 mg | ORAL_TABLET | ORAL | 0 refills | Status: DC | PRN

## 2015-10-18 MED ORDER — POTASSIUM CHLORIDE 10 MEQ/100ML IV SOLN
10.0000 meq | INTRAVENOUS | Status: AC
Start: 2015-10-18 — End: 2015-10-18
  Administered 2015-10-18 (×3): 10 meq via INTRAVENOUS
  Filled 2015-10-18 (×2): qty 100

## 2015-10-18 MED ORDER — POTASSIUM CHLORIDE IN NACL 20-0.9 MEQ/L-% IV SOLN
INTRAVENOUS | Status: DC
  Administered 2015-10-18: 05:00:00 via INTRAVENOUS
  Filled 2015-10-18: qty 1000

## 2015-10-18 NOTE — Progress Notes (Signed)
Pt discharged home with her mom in stable condition. Discharge teaching given with no concerns voiced

## 2015-10-18 NOTE — Progress Notes (Signed)
Patient ID: Jasmine [name], female   DOB: March 01, 1990, 25 y.o.   MRN: [id number]  [facility] Surgery Progress Note  2 Days Post-Op  Subjective: Feeling better than yesterday but still very sore. No more n/v since yesterday afternoon. States that she has not had a BM and is not passing gas. Trying to use IS but pulling <100.  Objective: Vital signs in last 24 hours: Temp:  [99.5 F (37.5 C)-100.8 F (38.2 C)] 99.5 F (37.5 C) (10/03 0548) Pulse Rate:  [89-98] 96 (10/03 0548) Resp:  [16-17] 16 (10/03 0548) BP: (114-123)/(57-61) 114/57 (10/03 0548) SpO2:  [95 %-100 %] 100 % (10/03 0548) Last BM Date: 10/13/15  Intake/Output from previous day: 10/02 0701 - 10/03 0700 In: 480 [P.O.:480] Out: 1100 [Urine:1100] Intake/Output this shift: No intake/output data recorded.  PE: Gen:  Alert, NAD, pleasant Card:  RRR, no M/G/R heard Pulm:  CTAB, no W/R/R Abd: Soft, ND, moderately tender globally, +BS, incisions covered with clean/dry dressings  Lab Results:   Recent Labs  10/17/15 1216 10/18/15 0259  WBC 10.5 9.9  HGB 10.6* 10.3*  HCT 33.5* 32.7*  PLT 190 172   BMET  Recent Labs  10/17/15 1216 10/18/15 0259  NA 141 139  K 2.8* 3.1*  CL 110 108  CO2 25 24  GLUCOSE 98 103*  BUN <5* <5*  CREATININE 0.69 0.76  CALCIUM 8.5* 8.4*   PT/INR No results for input(s): LABPROT, INR in the last 72 hours. CMP     Component Value Date/Time   NA 139 10/18/2015 0259   K 3.1 (L) 10/18/2015 0259   CL 108 10/18/2015 0259   CO2 24 10/18/2015 0259   GLUCOSE 103 (H) 10/18/2015 0259   BUN <5 (L) 10/18/2015 0259   CREATININE 0.76 10/18/2015 0259   CALCIUM 8.4 (L) 10/18/2015 0259   PROT 5.1 (L) 10/18/2015 0259   ALBUMIN 2.8 (L) 10/18/2015 0259   AST 70 (H) 10/18/2015 0259   ALT 91 (H) 10/18/2015 0259   ALKPHOS 40 10/18/2015 0259   BILITOT 0.7 10/18/2015 0259   GFRNONAA >60 10/18/2015 0259   GFRAA >60 10/18/2015 0259   Lipase     Component Value Date/Time   LIPASE 30  10/14/2015 1325       Studies/Results: Dg Cholangiogram Operative  Result Date: 10/16/2015 CLINICAL DATA:  Intraoperative cholangiogram during laparoscopic cholecystectomy. EXAM: INTRAOPERATIVE CHOLANGIOGRAM FLUOROSCOPY TIME:  7.7 seconds COMPARISON:  Right upper quadrant abdominal ultrasound - 10/03/2015; CT abdomen pelvis - 04/21/2015 FINDINGS: Intraoperative cholangiographic images of the right upper abdominal quadrant during laparoscopic cholecystectomy are provided for review. Surgical clips overlie the expected location of the gallbladder fossa. Contrast injection demonstrates selective cannulation of the central aspect of the cystic duct. There is passage of contrast through the central aspect of the cystic duct with filling of a non dilated common bile duct. There is passage of contrast though the CBD and into the descending portion of the duodenum. There is minimal reflux of injected contrast into the common hepatic duct and central aspect of the non dilated intrahepatic biliary system. There are no discrete filling defects within the opacified portions of the biliary system to suggest the presence of choledocholithiasis. IMPRESSION: No evidence of choledocholithiasis. Electronically Signed   By: Sandi Mariscal M.D.   On: 10/16/2015 10:02    Anti-infectives: Anti-infectives    Start     Dose/Rate Route Frequency Ordered Stop   10/14/15 1315  ciprofloxacin (CIPRO) IVPB 400 mg     400 mg 200  mL/hr over 60 Minutes Intravenous Every 12 hours 10/14/15 1307         Assessment/Plan S/p laparoscopic cholecystectomy with IOC 10/16/15 Dr. Georgette Dover - POD 2 - slow improvement in abdominal pain. No n/v today. No BM or flatus. Tolerating small amounts of diet.  Hypokalemia - 3.1 today after 3 runs of K yesterday and adding K to IVF. Will give more K today and recheck later.  ID - cipro day 5 FEN - advance as tolerated VTE - lovenox, SCD's  Plan - 3 more runs of K, recheck later. Encouraged  patient to mobilize and use IS. Will recheck later today for reevaluation.    LOS: 4 days    Jerrye Beavers , [facility] Surgery 10/18/2015, 8:02 AM Pager: [phone] Consults: [phone] Mon-Fri 7:00 am-4:30 pm Sat-Sun 7:00 am-11:30 am

## 2015-10-18 NOTE — Discharge Summary (Signed)
[facility] Discharge Summary   Patient ID: Jasmine [name] MRN: [id number] DOB/AGE: Oct 25, 1990 25 y.o.  Admit date: 10/14/2015 Discharge date: 10/18/2015  Admitting Diagnosis: Cholecystitis with cholelithiasis  Discharge Diagnosis Patient Active Problem List   Diagnosis Date Noted  . Calculus of bile duct with acute cholecystitis with obstruction 10/14/2015  . Adjustment disorder with mixed anxiety and depressed mood 04/26/2015  . Hypokalemia 04/19/2015  . Asthma with acute exacerbation 04/19/2015  . Asthma exacerbation 01/03/2015  . Influenza-like illness 01/03/2015  . Status post cesarean section 11/08/2014  . Previous cesarean section complicating pregnancy, antepartum condition or complication   . GERD (gastroesophageal reflux disease)   . Gastroesophageal reflux disease without esophagitis   . Abnormal biochemical finding on antenatal screening of mother   . Abnormal quad screen   . Echogenic focus of heart of fetus affecting antepartum care of mother   . Drug use affecting pregnancy, antepartum 05/19/2014  . Seizure disorder during pregnancy, antepartum ([place]) 05/13/2014  . Supervision of high risk pregnancy, antepartum 05/13/2014  . Asthma complicating pregnancy, antepartum 05/13/2014  . History of food anaphylaxis 05/13/2014  . Seizure disorder, sept 2015 last seizure 01/19/2013    Consultants None  Imaging: US abdomen limited RUQ 10/03/15: Cholelithiasis.  No sonographic evidence of acute cholecystitis.  Procedures Dr. Georgette Dover (10/16/15) - Laparoscopic Cholecystectomy with [place] Hospital Course:  Jasmine [name] is a 25yo female who was directly admitted to [facility] with RUQ abdominal pain, nausea, vomiting and diarrhea.  Workup showed symptomatic cholelithiasis.  Patient was admitted and underwent procedure listed above.  Tolerated procedure well and was transferred to the floor.  She did have difficulty with pain control on POD1 as well has hypokalemia. Her  potassium was repleated. Diet was advanced as tolerated.  By the end of the day on POD2 the patient was feeling much better, requesting to go home, tolerating diet, ambulating well, pain well controlled, vital signs stable, incisions c/d/i and felt stable for discharge home.  Patient will follow up in our office in 3 weeks and knows to call with questions or concerns.  Physical Exam: Gen: Alert, NAD, pleasant Card: RRR, no M/G/R heard Pulm: CTAB, no W/R/R Abd: Soft, ND, moderately tender globally, +BS, incisions covered with clean/dry dressings    Medication List    STOP taking these medications   levofloxacin 500 MG tablet Commonly known as:  LEVAQUIN     TAKE these medications   ADVIL PM 200-38 MG Tabs Generic drug:  Ibuprofen-Diphenhydramine Cit Take 2 tablets by mouth daily as needed (sleep).   albuterol 108 (90 Base) MCG/ACT inhaler Commonly known as:  PROVENTIL HFA;VENTOLIN HFA Inhale 2 puffs into the lungs every 4 (four) hours as needed for wheezing or shortness of breath.   albuterol (2.5 MG/3ML) 0.083% nebulizer solution Commonly known as:  PROVENTIL Take 3 mLs (2.5 mg total) by nebulization every 4 (four) hours as needed for wheezing or shortness of breath.   budesonide-formoterol 160-4.5 MCG/ACT inhaler Commonly known as:  SYMBICORT Inhale 2 puffs into the lungs 2 (two) times daily.   cyclobenzaprine 10 MG tablet Commonly known as:  FLEXERIL Take 1 tablet (10 mg total) by mouth 3 (three) times daily as needed for muscle spasms (or pain).   dicyclomine 10 MG capsule Commonly known as:  BENTYL Take 1 capsule (10 mg total) by mouth 4 (four) times daily -  before meals and at bedtime.   EPINEPHrine 1 MG/ML injection Commonly known as:  ADRENALIN Inject 1 mL (1 mg  total) into the muscle once. What changed:  when to take this  reasons to take this   fluticasone 50 MCG/ACT nasal spray Commonly known as:  FLONASE Place 2 sprays into both nostrils daily.    levETIRAcetam 500 MG tablet Commonly known as:  KEPPRA Take 1 tablet (500 mg total) by mouth 2 (two) times daily.   montelukast 10 MG tablet Commonly known as:  SINGULAIR Take 1 tablet (10 mg total) by mouth at bedtime.   OLANZapine 5 MG tablet Commonly known as:  ZYPREXA Take 1 tablet (5 mg total) by mouth at bedtime.   oxyCODONE 5 MG immediate release tablet Commonly known as:  Oxy IR/ROXICODONE Take 1 tablet (5 mg total) by mouth every 4 (four) hours as needed for moderate pain.   pantoprazole 40 MG tablet Commonly known as:  PROTONIX Take 1 tablet (40 mg total) by mouth daily.   sucralfate 1 GM/10ML suspension Commonly known as:  CARAFATE Take 10 mLs (1 g total) by mouth 4 (four) times daily -  with meals and at bedtime.        Follow-up Information    Maia Petties., MD. Daphane Shepherd on 11/07/2015.   Specialty:  General Surgery Why:  Your appointment is 11/07/2015 at 9:10am. Please arrive 15 minutes prior to your appointment time to fill out necessary paperwork. Contact information: [address] [place] [address] [phone]           Signed: Jerrye Beavers, [facility] Surgery 10/18/2015, 4:39 PM Pager: [phone] Consults: [phone] Mon-Fri 7:00 am-4:30 pm Sat-Sun 7:00 am-11:30 am

## 2015-10-20 ENCOUNTER — Encounter: Payer: Self-pay | Admitting: Neurology

## 2015-11-27 ENCOUNTER — Encounter (HOSPITAL_COMMUNITY): Payer: Self-pay

## 2015-11-27 ENCOUNTER — Emergency Department (HOSPITAL_COMMUNITY)
Admission: EM | Admit: 2015-11-27 | Discharge: 2015-11-27 | Disposition: A | Discharge status: Home / Self Care | Payer: Medicaid Other | Attending: Emergency Medicine | Admitting: Emergency Medicine

## 2015-11-27 DIAGNOSIS — M24461 Recurrent dislocation, right knee: Secondary | ICD-10-CM | POA: Insufficient documentation

## 2015-11-27 DIAGNOSIS — Z91010 Allergy to peanuts: Secondary | ICD-10-CM | POA: Diagnosis not present

## 2015-11-27 DIAGNOSIS — J45909 Unspecified asthma, uncomplicated: Secondary | ICD-10-CM | POA: Diagnosis not present

## 2015-11-27 DIAGNOSIS — Z87891 Personal history of nicotine dependence: Secondary | ICD-10-CM | POA: Diagnosis not present

## 2015-11-27 DIAGNOSIS — Z91040 Latex allergy status: Secondary | ICD-10-CM | POA: Diagnosis not present

## 2015-11-27 DIAGNOSIS — M2201 Recurrent dislocation of patella, right knee: Secondary | ICD-10-CM

## 2015-11-27 DIAGNOSIS — M25561 Pain in right knee: Secondary | ICD-10-CM | POA: Diagnosis present

## 2015-11-27 MED ORDER — NAPROXEN 375 MG PO TABS: 375.0000 mg | ORAL_TABLET | Freq: Two times a day (BID) | ORAL | 0 refills | Status: DC

## 2015-11-27 NOTE — ED Notes (Signed)
EDP at bedside  

## 2015-11-27 NOTE — Progress Notes (Signed)
Orthopedic Tech Progress Note Patient Details:  Jasmine [name] Jan 05, 1991 [id number]  Ortho Devices Type of Ortho Device: Crutches, Knee Immobilizer Ortho Device/Splint Location: RLE Ortho Device/Splint Interventions: Ordered, Application   Braulio Bosch 11/27/2015, 3:36 PM

## 2015-11-27 NOTE — ED Notes (Signed)
Ortho tech at bedside 

## 2015-11-27 NOTE — ED Provider Notes (Signed)
[facility] DEPT Provider Note    By signing my name below, I, Bea Graff, attest that this documentation has been prepared under the direction and in the presence of Margarita Mail, PA-C. Electronically Signed: Bea Graff, ED Scribe. 11/27/15. 3:18 PM.   History   Chief Complaint No chief complaint on file.  The history is provided by the patient and medical records. No language interpreter was used.    HPI Comments:  Jasmine [name] is a 25 y.o. female who presents to the Emergency Department complaining of intermittent right knee pain that began about one week ago. She reports intermittent swelling of the knee as well. Pt reports being able to move her right patella further than normal. Her left knee is painful due to favoring the right knee. She has been taking Advil, Tylenol and Aleve for pain with minimal relief. Bending the right knee increases the pain. Extension of the knee helps to alleviate the pain. She denies numbness, tingling or weakness of the lower extremities, bruising, wounds, fever, chills, nausea, vomiting.   Past Medical History:  Diagnosis Date  . Asthma   . Complication of anesthesia    "I wake up during anesthesia" (10/14/2015)  . Epilepsy ([place])   . GERD (gastroesophageal reflux disease)   . Heart murmur    last work up age 48- no symtoms  . Pancreatitis   . Seizures ([place])    epilepsy  . Sickle cell trait ([place])   . Sleep apnea    "used to wear CPAP; don't have one here in [place] since I moved in 2014" (10/14/2015)    Patient Active Problem List   Diagnosis Date Noted  . Calculus of bile duct with acute cholecystitis with obstruction 10/14/2015  . Adjustment disorder with mixed anxiety and depressed mood 04/26/2015  . Hypokalemia 04/19/2015  . Asthma with acute exacerbation 04/19/2015  . Asthma exacerbation 01/03/2015  . Influenza-like illness 01/03/2015  . Status post cesarean section 11/08/2014  . Previous cesarean section complicating  pregnancy, antepartum condition or complication   . GERD (gastroesophageal reflux disease)   . Gastroesophageal reflux disease without esophagitis   . Abnormal biochemical finding on antenatal screening of mother   . Abnormal quad screen   . Echogenic focus of heart of fetus affecting antepartum care of mother   . Drug use affecting pregnancy, antepartum 05/19/2014  . Seizure disorder during pregnancy, antepartum ([place]) 05/13/2014  . Supervision of high risk pregnancy, antepartum 05/13/2014  . Asthma complicating pregnancy, antepartum 05/13/2014  . History of food anaphylaxis 05/13/2014  . Seizure disorder, sept 2015 last seizure 01/19/2013    Past Surgical History:  Procedure Laterality Date  . APPENDECTOMY    . CESAREAN SECTION  2013  . CESAREAN SECTION N/A 11/08/2014   Procedure: CESAREAN SECTION;  Surgeon: Truett Mainland, DO;  Location: [facility];  Service: Obstetrics;  Laterality: N/A;  . CHOLECYSTECTOMY N/A 10/16/2015   Procedure: LAPAROSCOPIC CHOLECYSTECTOMY WITH POSSIBLE INTRAOPERATIVE CHOLANGIOGRAM;  Surgeon: Donnie Mesa, MD;  Location: [facility];  Service: General;  Laterality: N/A;  . INGUINAL HERNIA REPAIR Bilateral ~ 1996  . NERVE, TENDON AND ARTERY REPAIR Right 09/23/2012   Procedure: I&D and Repair As Necessary/Right Hand and Palm;  Surgeon: Roseanne Kaufman, MD;  Location: [facility];  Service: Orthopedics;  Laterality: Right;  . TONSILLECTOMY      OB History    Gravida Para Term Preterm AB Living   3 3 3     3    SAB TAB Ectopic Multiple Live  Births         0 3       Home Medications    Prior to Admission medications   Medication Sig Start Date End Date Taking? Authorizing Provider  albuterol (PROVENTIL HFA;VENTOLIN HFA) 108 (90 Base) MCG/ACT inhaler Inhale 2 puffs into the lungs every 4 (four) hours as needed for wheezing or shortness of breath. 09/22/15   Maren Reamer, MD  albuterol (PROVENTIL) (2.5 MG/3ML) 0.083% nebulizer solution Take 3 mLs (2.5 mg total) by  nebulization every 4 (four) hours as needed for wheezing or shortness of breath. 09/22/15   Maren Reamer, MD  budesonide-formoterol (SYMBICORT) 160-4.5 MCG/ACT inhaler Inhale 2 puffs into the lungs 2 (two) times daily. 09/22/15   Maren Reamer, MD  cyclobenzaprine (FLEXERIL) 10 MG tablet Take 1 tablet (10 mg total) by mouth 3 (three) times daily as needed for muscle spasms (or pain). Patient not taking: Reported on 10/14/2015 08/07/15   Clayton Bibles, PA-C  dicyclomine (BENTYL) 10 MG capsule Take 1 capsule (10 mg total) by mouth 4 (four) times daily -  before meals and at bedtime. Patient not taking: Reported on 10/14/2015 10/10/15   Maren Reamer, MD  EPINEPHrine (ADRENALIN) 1 MG/ML injection Inject 1 mL (1 mg total) into the muscle once. Patient taking differently: Inject 1 mg into the muscle once as needed for anaphylaxis (severe allergic reation).  06/10/14   Nila Nephew, MD  fluticasone (FLONASE) 50 MCG/ACT nasal spray Place 2 sprays into both nostrils daily. Patient not taking: Reported on 10/14/2015 09/22/15   Maren Reamer, MD  Ibuprofen-Diphenhydramine Cit (ADVIL PM) 200-38 MG TABS Take 2 tablets by mouth daily as needed (sleep).    Historical Provider, MD  levETIRAcetam (KEPPRA) 500 MG tablet Take 1 tablet (500 mg total) by mouth 2 (two) times daily. Patient not taking: Reported on 10/14/2015 09/22/15   Maren Reamer, MD  montelukast (SINGULAIR) 10 MG tablet Take 1 tablet (10 mg total) by mouth at bedtime. 09/22/15   Maren Reamer, MD  OLANZapine (ZYPREXA) 5 MG tablet Take 1 tablet (5 mg total) by mouth at bedtime. Patient not taking: Reported on 10/14/2015 09/22/15   Maren Reamer, MD  oxyCODONE (OXY IR/ROXICODONE) 5 MG immediate release tablet Take 1 tablet (5 mg total) by mouth every 4 (four) hours as needed for moderate pain. 10/18/15   Jerrye Beavers, PA-C  pantoprazole (PROTONIX) 40 MG tablet Take 1 tablet (40 mg total) by mouth daily. Patient not taking: Reported on 10/14/2015  09/22/15   Maren Reamer, MD  sucralfate (CARAFATE) 1 GM/10ML suspension Take 10 mLs (1 g total) by mouth 4 (four) times daily -  with meals and at bedtime. Patient not taking: Reported on 10/14/2015 09/22/15   Maren Reamer, MD    Family History Family History  Problem Relation Age of Onset  . Cancer Mother     cervical cancer  . Asthma Mother   . Hypertension Father   . Sickle cell anemia Father   . Asthma Sister   . Diabetes Maternal Aunt   . Cancer Maternal Grandmother     Social History Social History  Substance Use Topics  . Smoking status: Former Smoker    Packs/day: 0.25    Years: 4.00    Types: Cigarettes    Quit date: 10/02/2015  . Smokeless tobacco: Never Used  . Alcohol use 0.0 oz/week     Comment: 10/14/2015 "might have a few drinks/year; big parties, holidays"  Allergies   Cheese; Chocolate; Ibuprofen; Ivp dye [iodinated diagnostic agents]; Latex; Orange juice [orange oil]; Other; Peach [prunus persica]; Peanuts [peanut oil]; Pear; Prednisone; Raspberry; Tylenol [acetaminophen]; Amoxicillin; Apricot flavor; Doxycycline; Erythromycin; Milk of magnesia [magnesium hydroxide]; and Penicillins   Review of Systems Review of Systems  Constitutional: Negative for chills and fever.  Gastrointestinal: Negative for nausea and vomiting.  Musculoskeletal: Positive for arthralgias and joint swelling.  Skin: Negative for color change and wound.  Neurological: Negative for weakness and numbness.     Physical Exam Updated Vital Signs BP 107/69 (BP Location: Left Arm)   Pulse 80   Temp 98.3 F (36.8 C)   Resp 18   SpO2 99%   Physical Exam  Constitutional: She is oriented to person, place, and time. She appears well-developed and well-nourished.  HENT:  Head: Normocephalic and atraumatic.  Neck: Normal range of motion.  Cardiovascular: Normal rate.   Pulmonary/Chest: Effort normal.  Musculoskeletal: Normal range of motion. She exhibits tenderness. She  exhibits no edema or deformity.  Right patella easily dislocates to right and left. Tenderness to palpation along patellar tendon. No overt swelling. Left knee with medial joint line pain but remainder of exam normal.  Neurological: She is alert and oriented to person, place, and time.  Skin: Skin is warm and dry.  Psychiatric: She has a normal mood and affect. Her behavior is normal.  Nursing note and vitals reviewed.    ED Treatments / Results  DIAGNOSTIC STUDIES: Oxygen Saturation is 99% on RA, normal by my interpretation.   COORDINATION OF CARE: 3:12 PM- Will refer to orthopedics. Will provide knee immobilizer. Pt verbalizes understanding and agrees to plan.  Medications - No data to display  Labs (all labs ordered are listed, but only abnormal results are displayed) Labs Reviewed - No data to display  EKG  EKG Interpretation None       Radiology No results found.  Procedures Procedures (including critical care time)  Medications Ordered in ED Medications - No data to display   Initial Impression / Assessment and Plan / ED Course  I have reviewed the triage vital signs and the nursing notes.  Pertinent labs & imaging results that were available during my care of the patient were reviewed by me and considered in my medical decision making (see chart for details).  Clinical Course     Patient with chronic  R patellar dislocation. Media left knee pain from compensation of limp. Patient will be discharged with knee immobilizer, aleve (which the patient is able to take). And ortho follow up.  I personally performed the services described in this documentation, which was scribed in my presence. The recorded information has been reviewed and is accurate.         Final Clinical Impressions(s) / ED Diagnoses   Final diagnoses:  None    New Prescriptions New Prescriptions   No medications on file     Margarita Mail, PA-C 11/27/15 [place],  DO 11/27/15 [id number]

## 2015-11-27 NOTE — ED Triage Notes (Signed)
Patient complains of right knee pain and swelling x 1 week. Denies trauma. Has been using aleve with minimal relief. NAD

## 2016-01-17 ENCOUNTER — Encounter (HOSPITAL_COMMUNITY): Payer: Self-pay

## 2016-01-17 ENCOUNTER — Emergency Department (HOSPITAL_COMMUNITY)
Admission: EM | Admit: 2016-01-17 | Discharge: 2016-01-17 | Disposition: A | Discharge status: Home / Self Care | Payer: Medicaid Other | Attending: Pediatrics | Admitting: Pediatrics

## 2016-01-17 DIAGNOSIS — W1811XA Fall from or off toilet without subsequent striking against object, initial encounter: Secondary | ICD-10-CM | POA: Diagnosis not present

## 2016-01-17 DIAGNOSIS — M542 Cervicalgia: Secondary | ICD-10-CM | POA: Diagnosis not present

## 2016-01-17 DIAGNOSIS — Y9289 Other specified places as the place of occurrence of the external cause: Secondary | ICD-10-CM | POA: Diagnosis not present

## 2016-01-17 DIAGNOSIS — J45909 Unspecified asthma, uncomplicated: Secondary | ICD-10-CM | POA: Diagnosis not present

## 2016-01-17 DIAGNOSIS — Y999 Unspecified external cause status: Secondary | ICD-10-CM | POA: Diagnosis not present

## 2016-01-17 DIAGNOSIS — Z87891 Personal history of nicotine dependence: Secondary | ICD-10-CM | POA: Diagnosis not present

## 2016-01-17 DIAGNOSIS — Z5321 Procedure and treatment not carried out due to patient leaving prior to being seen by health care provider: Secondary | ICD-10-CM | POA: Diagnosis not present

## 2016-01-17 DIAGNOSIS — Y939 Activity, unspecified: Secondary | ICD-10-CM | POA: Diagnosis not present

## 2016-01-17 DIAGNOSIS — S40021A Contusion of right upper arm, initial encounter: Secondary | ICD-10-CM | POA: Insufficient documentation

## 2016-01-17 DIAGNOSIS — R569 Unspecified convulsions: Secondary | ICD-10-CM | POA: Diagnosis not present

## 2016-01-17 DIAGNOSIS — S4991XA Unspecified injury of right shoulder and upper arm, initial encounter: Secondary | ICD-10-CM | POA: Diagnosis present

## 2016-01-17 LAB — CBG MONITORING, ED: GLUCOSE-CAPILLARY: 99 mg/dL (ref 65–99)

## 2016-01-17 LAB — POC URINE PREG, ED: Preg Test, Ur: NEGATIVE

## 2016-01-17 NOTE — ED Notes (Signed)
[facility] called x 3 for room with no answer.

## 2016-01-17 NOTE — ED Triage Notes (Addendum)
Onset yesterday pt had seizure in the bathroom, fell in between toilet and bath tub.  Right lateral neck, right upper arm pain, and left neck pain.  Bruising noted on right upper arm.  Pt has been out of Keppra x 1 month.  Pt reports several seizures in the past month.

## 2016-04-19 ENCOUNTER — Emergency Department (HOSPITAL_COMMUNITY)
Admission: EM | Admit: 2016-04-19 | Discharge: 2016-04-19 | Disposition: A | Discharge status: Home / Self Care | Payer: Self-pay | Attending: Emergency Medicine | Admitting: Emergency Medicine

## 2016-04-19 ENCOUNTER — Emergency Department (HOSPITAL_COMMUNITY): Payer: Self-pay

## 2016-04-19 ENCOUNTER — Encounter (HOSPITAL_COMMUNITY): Payer: Self-pay

## 2016-04-19 DIAGNOSIS — Y939 Activity, unspecified: Secondary | ICD-10-CM | POA: Insufficient documentation

## 2016-04-19 DIAGNOSIS — M542 Cervicalgia: Secondary | ICD-10-CM | POA: Insufficient documentation

## 2016-04-19 DIAGNOSIS — S20311A Abrasion of right front wall of thorax, initial encounter: Secondary | ICD-10-CM | POA: Insufficient documentation

## 2016-04-19 DIAGNOSIS — M25551 Pain in right hip: Secondary | ICD-10-CM | POA: Insufficient documentation

## 2016-04-19 DIAGNOSIS — M545 Low back pain: Secondary | ICD-10-CM | POA: Insufficient documentation

## 2016-04-19 DIAGNOSIS — Y92410 Unspecified street and highway as the place of occurrence of the external cause: Secondary | ICD-10-CM | POA: Insufficient documentation

## 2016-04-19 DIAGNOSIS — R0789 Other chest pain: Secondary | ICD-10-CM

## 2016-04-19 DIAGNOSIS — Y999 Unspecified external cause status: Secondary | ICD-10-CM | POA: Insufficient documentation

## 2016-04-19 DIAGNOSIS — M25512 Pain in left shoulder: Secondary | ICD-10-CM | POA: Insufficient documentation

## 2016-04-19 LAB — POC URINE PREG, ED: PREG TEST UR: NEGATIVE

## 2016-04-19 MED ORDER — METHOCARBAMOL 500 MG PO TABS
500.0000 mg | ORAL_TABLET | Freq: Once | ORAL | Status: AC
  Administered 2016-04-19: 500 mg via ORAL
  Filled 2016-04-19: qty 1

## 2016-04-19 MED ORDER — METHOCARBAMOL 500 MG PO TABS: 500.0000 mg | ORAL_TABLET | Freq: Two times a day (BID) | ORAL | 0 refills | Status: DC

## 2016-04-19 MED ORDER — OXYCODONE HCL 5 MG PO TABS
5.0000 mg | ORAL_TABLET | Freq: Once | ORAL | Status: AC
  Administered 2016-04-19: 5 mg via ORAL
  Filled 2016-04-19: qty 1

## 2016-04-19 MED ORDER — OXYCODONE HCL 5 MG PO TABS: 5.0000 mg | ORAL_TABLET | ORAL | 0 refills | Status: DC | PRN

## 2016-04-19 NOTE — ED Notes (Signed)
Yzabelle Calles, Mother, [phone]

## 2016-04-19 NOTE — Discharge Instructions (Signed)
Take the prescribed medication as directed.  Do not drive while taking these.  Can use heat therapy to help with muscle soreness as well.  You can expect to have some soreness for the next several days. Follow-up with your primary care doctor. Return to the ED for new or worsening symptoms.
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
## 2016-05-17 ENCOUNTER — Emergency Department (HOSPITAL_COMMUNITY)
Admission: EM | Admit: 2016-05-17 | Discharge: 2016-05-17 | Discharge status: Against Medical Advice | Payer: No Typology Code available for payment source

## 2016-05-17 NOTE — ED Notes (Signed)
Name called to lobby 3 times without response.

## 2016-05-17 NOTE — ED Notes (Signed)
Name called to lobby without response.

## 2016-05-31 ENCOUNTER — Encounter: Payer: Self-pay | Admitting: Internal Medicine

## 2016-06-01 ENCOUNTER — Encounter: Payer: Self-pay | Admitting: Internal Medicine

## 2016-06-04 ENCOUNTER — Encounter: Payer: Self-pay | Admitting: Internal Medicine

## 2016-06-23 ENCOUNTER — Ambulatory Visit (HOSPITAL_COMMUNITY): Payer: No Typology Code available for payment source

## 2016-06-23 ENCOUNTER — Emergency Department (HOSPITAL_COMMUNITY): Payer: Medicaid Other

## 2016-06-23 ENCOUNTER — Emergency Department (HOSPITAL_COMMUNITY)
Admission: EM | Admit: 2016-06-23 | Discharge: 2016-06-23 | Disposition: A | Discharge status: Home / Self Care | Payer: Medicaid Other | Attending: Emergency Medicine | Admitting: Emergency Medicine

## 2016-06-23 ENCOUNTER — Encounter (HOSPITAL_COMMUNITY): Payer: Self-pay

## 2016-06-23 DIAGNOSIS — Z91040 Latex allergy status: Secondary | ICD-10-CM | POA: Insufficient documentation

## 2016-06-23 DIAGNOSIS — M79662 Pain in left lower leg: Secondary | ICD-10-CM | POA: Insufficient documentation

## 2016-06-23 DIAGNOSIS — Z87891 Personal history of nicotine dependence: Secondary | ICD-10-CM | POA: Insufficient documentation

## 2016-06-23 DIAGNOSIS — M25562 Pain in left knee: Secondary | ICD-10-CM | POA: Insufficient documentation

## 2016-06-23 DIAGNOSIS — J45909 Unspecified asthma, uncomplicated: Secondary | ICD-10-CM | POA: Insufficient documentation

## 2016-06-23 DIAGNOSIS — Z79899 Other long term (current) drug therapy: Secondary | ICD-10-CM | POA: Diagnosis not present

## 2016-06-23 DIAGNOSIS — M79605 Pain in left leg: Secondary | ICD-10-CM

## 2016-06-23 DIAGNOSIS — Z91010 Allergy to peanuts: Secondary | ICD-10-CM | POA: Diagnosis not present

## 2016-06-23 MED ORDER — OXYCODONE HCL 5 MG PO TABS
5.0000 mg | ORAL_TABLET | Freq: Once | ORAL | Status: AC
  Administered 2016-06-23: 5 mg via ORAL
  Filled 2016-06-23: qty 1

## 2016-06-23 MED ORDER — ENOXAPARIN SODIUM 80 MG/0.8ML ~~LOC~~ SOLN
70.0000 mg | Freq: Once | SUBCUTANEOUS | Status: AC
  Administered 2016-06-23: 05:00:00 70 mg via SUBCUTANEOUS
  Filled 2016-06-23: qty 0.7

## 2016-06-23 MED ORDER — OXYCODONE HCL 5 MG PO TABS: 5.0000 mg | ORAL_TABLET | ORAL | 0 refills | Status: DC | PRN

## 2016-06-23 NOTE — ED Provider Notes (Signed)
[facility] DEPT Provider Note   CSN: [id number] Arrival date & time: 06/23/16  0158     History   Chief Complaint Chief Complaint  Patient presents with  . Knee Pain    Left    HPI Jasmine [name] is a 26 y.o. female.  HPI   Patient is a 26 year old female with history of asthma, pancreatitis, GERD, sickle cell trait and seizures who presents the ED with complaint of left knee pain, onset one week. Patient reports over the past week she has had constant gradually worsening sharp pain to the back of her left knee. She reports the pain radiates from her left posterior distal thigh into her left mid calf. Patient reports pain is worse with movement or when bearing weight. She states today she has been unable to walk due to worsening pain. Denies any history of similar symptoms in the past. Denies any prior injury or trauma to her  left lower extremity. Endorses mild associated swelling to her left knee. Denies fever, chills, chest pain, shortness of breath, redness, warmth, numbness, weakness. Endorses history of DVT in her right leg that occurred while she was pregnant. Denies any current use of anticoagulants. Patient denies any recent long car rides or airplane travel, recent hospitalizations or surgeries, active cancer.  Past Medical History:  Diagnosis Date  . Asthma   . Complication of anesthesia    "I wake up during anesthesia" (10/14/2015)  . DVT (deep vein thrombosis) in pregnancy ([place])   . Epilepsy ([place])   . GERD (gastroesophageal reflux disease)   . Heart murmur    last work up age 36- no symtoms  . Pancreatitis   . Seizures ([place])    epilepsy  . Sickle cell trait ([place])   . Sleep apnea    "used to wear CPAP; don't have one here in [place] since I moved in 2014" (10/14/2015)    Patient Active Problem List   Diagnosis Date Noted  . Calculus of bile duct with acute cholecystitis with obstruction 10/14/2015  . Adjustment disorder with mixed anxiety and depressed mood  04/26/2015  . Hypokalemia 04/19/2015  . Asthma with acute exacerbation 04/19/2015  . Asthma exacerbation 01/03/2015  . Influenza-like illness 01/03/2015  . Status post cesarean section 11/08/2014  . Previous cesarean section complicating pregnancy, antepartum condition or complication   . GERD (gastroesophageal reflux disease)   . Gastroesophageal reflux disease without esophagitis   . Abnormal biochemical finding on antenatal screening of mother   . Abnormal quad screen   . Echogenic focus of heart of fetus affecting antepartum care of mother   . Drug use affecting pregnancy, antepartum 05/19/2014  . Seizure disorder during pregnancy, antepartum ([place]) 05/13/2014  . Supervision of high risk pregnancy, antepartum 05/13/2014  . Asthma complicating pregnancy, antepartum 05/13/2014  . History of food anaphylaxis 05/13/2014  . Seizure disorder, sept 2015 last seizure 01/19/2013    Past Surgical History:  Procedure Laterality Date  . APPENDECTOMY    . CESAREAN SECTION  2013  . CESAREAN SECTION N/A 11/08/2014   Procedure: CESAREAN SECTION;  Surgeon: Truett Mainland, DO;  Location: [facility];  Service: Obstetrics;  Laterality: N/A;  . CHOLECYSTECTOMY N/A 10/16/2015   Procedure: LAPAROSCOPIC CHOLECYSTECTOMY WITH POSSIBLE INTRAOPERATIVE CHOLANGIOGRAM;  Surgeon: Donnie Mesa, MD;  Location: [facility];  Service: General;  Laterality: N/A;  . INGUINAL HERNIA REPAIR Bilateral ~ 1996  . NERVE, TENDON AND ARTERY REPAIR Right 09/23/2012   Procedure: I&D and Repair As Necessary/Right Hand and Palm;  Surgeon: Roseanne Kaufman, MD;  Location: [facility];  Service: Orthopedics;  Laterality: Right;  . TONSILLECTOMY      OB History    Gravida Para Term Preterm AB Living   3 3 3     3    SAB TAB Ectopic Multiple Live Births         0 3       Home Medications    Prior to Admission medications   Medication Sig Start Date End Date Taking? Authorizing Provider  albuterol (PROVENTIL HFA;VENTOLIN HFA) 108 (90 Base)  MCG/ACT inhaler Inhale 2 puffs into the lungs every 4 (four) hours as needed for wheezing or shortness of breath. 09/22/15  Yes Langeland, Dawn T, MD  albuterol (PROVENTIL) (2.5 MG/3ML) 0.083% nebulizer solution Take 3 mLs (2.5 mg total) by nebulization every 4 (four) hours as needed for wheezing or shortness of breath. 09/22/15  Yes Langeland, Dawn T, MD  Ibuprofen-Diphenhydramine Cit (ADVIL PM) 200-38 MG TABS Take 2 tablets by mouth at bedtime as needed (for sleep/pain).    Yes [provider]  levETIRAcetam (KEPPRA) 500 MG tablet Take 1 tablet (500 mg total) by mouth 2 (two) times daily. 09/22/15  Yes Langeland, Dawn T, MD  oxyCODONE (ROXICODONE) 5 MG immediate release tablet Take 1 tablet (5 mg total) by mouth every 4 (four) hours as needed for severe pain. 06/23/16   Nona Dell, PA-C    Family History Family History  Problem Relation Age of Onset  . Cancer Mother        cervical cancer  . Asthma Mother   . Hypertension Father   . Sickle cell anemia Father   . Asthma Sister   . Diabetes Maternal Aunt   . Cancer Maternal Grandmother     Social History Social History  Substance Use Topics  . Smoking status: Former Smoker    Packs/day: 0.25    Years: 4.00    Types: Cigarettes    Quit date: 10/02/2015  . Smokeless tobacco: Never Used  . Alcohol use 0.0 oz/week     Comment: 10/14/2015 "might have a few drinks/year; big parties, holidays"     Allergies   Cheese; Chocolate; Ibuprofen; Ivp dye [iodinated diagnostic agents]; Latex; Orange juice [orange oil]; Other; Peach [prunus persica]; Peanuts [peanut oil]; Pear; Prednisone; Raspberry; Tylenol [acetaminophen]; Amoxicillin; Apricot flavor; Doxycycline; Erythromycin; Milk of magnesia [magnesium hydroxide]; and Penicillins   Review of Systems Review of Systems  Musculoskeletal: Positive for arthralgias (left knee), joint swelling and myalgias (left calf).  All other systems reviewed and are negative.    Physical  Exam Updated Vital Signs BP 103/61 (BP Location: Left Arm)   Pulse 63   Temp 98.4 F (36.9 C) (Oral)   Resp 18   LMP 06/16/2016   SpO2 98%   Physical Exam  Constitutional: She is oriented to person, place, and time. She appears well-developed and well-nourished.  HENT:  Head: Normocephalic and atraumatic.  Eyes: Conjunctivae and EOM are normal. Right eye exhibits no discharge. Left eye exhibits no discharge. No scleral icterus.  Neck: Normal range of motion. Neck supple.  Cardiovascular: Normal rate and intact distal pulses.   Pulmonary/Chest: Effort normal.  Musculoskeletal: She exhibits edema and tenderness. She exhibits no deformity.  Exquisite of left posterior distal thigh, knee and calf with light palpation. Dec active and passive ROM of left LE due to reported pain. Mild swelling present to left knee. Knee exam limited due to pt noncompliance from pain. TTP of calf with mild  swelling compared to right. No varicose veins present. TTP along deep venous system. Sensation grossly intact. 2+ DP pulses. No knee effusion, erythema or warmth present.   Neurological: She is alert and oriented to person, place, and time.  Skin: Skin is warm and dry. Capillary refill takes less than 2 seconds.  Nursing note and vitals reviewed.    ED Treatments / Results  Labs (all labs ordered are listed, but only abnormal results are displayed) Labs Reviewed - No data to display  EKG  EKG Interpretation None       Radiology Dg Knee Complete 4 Views Left  Result Date: 06/23/2016 CLINICAL DATA:  Left knee pain and swelling for 1 week, worse today. No known injury. EXAM: LEFT KNEE - COMPLETE 4+ VIEW COMPARISON:  Radiographs 01/02/2013 FINDINGS: No evidence of fracture, dislocation, or joint effusion. Mild patellar Alta. No evidence of arthropathy or other focal bone abnormality. Soft tissues are unremarkable. IMPRESSION: No acute abnormality.  Mild patella Alta. Electronically Signed   By: Jeb Levering M.D.   On: 06/23/2016 03:50    Procedures Procedures (including critical care time)  Medications Ordered in ED Medications  enoxaparin (LOVENOX) injection 70 mg (not administered)  oxyCODONE (Oxy IR/ROXICODONE) immediate release tablet 5 mg (5 mg Oral Given 06/23/16 0405)     Initial Impression / Assessment and Plan / ED Course  I have reviewed the triage vital signs and the nursing notes.  Pertinent labs & imaging results that were available during my care of the patient were reviewed by me and considered in my medical decision making (see chart for details).     Patient presents with worsening pain to the back of her left knee, distal thigh and left calf with associated swelling for the past week. Denies any recent fall, trauma or injury. Reports hx of right leg DVT last year when she was pregnant, denies current use of anticoagulants. VSS. Exam showed exquisite of left posterior distal thigh, knee and calf with light palpation. Dec active and passive ROM of left LE due to reported pain. Mild swelling present to left knee. TTP of calf with mild swelling compared to right. No varicose veins present. TTP along deep venous system. Left lower extremity otherwise neurovascularly intact. Patient given pain meds in the ED. Left knee x-ray revealed mild patella alta, otherwise unremarkable. No bony abnormality or deformity, no erythema or excessive heat, no evidence of cellulitis or septic joint  Patient's history and exam concerning for DVT, however due to Doppler ultrasound not being available at this time will give patient an initial dose of Lovenox in the ED and instructions to follow up at [facility] in the morning to have her Doppler performed. Patient reports understanding and agreement. Patient discharged home with pain meds. Discussed return precautions.  Final Clinical Impressions(s) / ED Diagnoses   Final diagnoses:  Left leg pain    New Prescriptions New Prescriptions    OXYCODONE (ROXICODONE) 5 MG IMMEDIATE RELEASE TABLET    Take 1 tablet (5 mg total) by mouth every 4 (four) hours as needed for severe pain.     Nona Dell, PA-C 62/70/35 [id number]    Delora Fuel, MD 81/82/99 [phone]

## 2016-06-23 NOTE — ED Triage Notes (Signed)
Pt reports 9/10 left posterior keen pain that radiates into her calf. Pt denies recent fall or traumatic injury. Pt reports she is unable to bear weight onto her left leg.

## 2016-06-23 NOTE — Discharge Instructions (Signed)
Go to the [facility] emergency department registration desk at 8 in the morning to check in to have a Doppler ultrasound performed of your left leg for evaluation of a blood clot. Take your medications as prescribed as needed for pain. Please return to the Emergency Department if symptoms worsen or new onset of fever, redness, worsening swelling/pain, numbness, weakness, chest pain, difficulty breathing.

## 2016-06-24 ENCOUNTER — Ambulatory Visit (HOSPITAL_COMMUNITY)
Admission: RE | Admit: 2016-06-24 | Discharge: 2016-06-24 | Disposition: A | Discharge status: Home / Self Care | Payer: Medicaid Other | Source: Ambulatory Visit | Attending: Emergency Medicine | Admitting: Emergency Medicine

## 2016-06-24 DIAGNOSIS — M7989 Other specified soft tissue disorders: Secondary | ICD-10-CM | POA: Diagnosis not present

## 2016-06-24 DIAGNOSIS — M79605 Pain in left leg: Secondary | ICD-10-CM | POA: Insufficient documentation

## 2016-06-24 DIAGNOSIS — M79609 Pain in unspecified limb: Secondary | ICD-10-CM

## 2016-06-24 NOTE — Progress Notes (Signed)
VASCULAR LAB PRELIMINARY  PRELIMINARY  PRELIMINARY  PRELIMINARY  Left lower extremity venous duplex completed.    Preliminary report:  There is no DVT or SVT noted in the left lower extremity.   Nailani Full, RVT 06/24/2016, 8:23 AM

## 2016-07-27 ENCOUNTER — Emergency Department (HOSPITAL_COMMUNITY)
Admission: EM | Admit: 2016-07-27 | Discharge: 2016-07-27 | Disposition: A | Discharge status: Home / Self Care | Payer: Self-pay | Attending: Emergency Medicine | Admitting: Emergency Medicine

## 2016-07-27 ENCOUNTER — Emergency Department (HOSPITAL_COMMUNITY): Payer: Self-pay

## 2016-07-27 ENCOUNTER — Encounter (HOSPITAL_COMMUNITY): Payer: Self-pay | Admitting: Emergency Medicine

## 2016-07-27 DIAGNOSIS — R112 Nausea with vomiting, unspecified: Secondary | ICD-10-CM | POA: Insufficient documentation

## 2016-07-27 DIAGNOSIS — R1011 Right upper quadrant pain: Secondary | ICD-10-CM | POA: Insufficient documentation

## 2016-07-27 DIAGNOSIS — Z86718 Personal history of other venous thrombosis and embolism: Secondary | ICD-10-CM | POA: Insufficient documentation

## 2016-07-27 DIAGNOSIS — D573 Sickle-cell trait: Secondary | ICD-10-CM | POA: Insufficient documentation

## 2016-07-27 DIAGNOSIS — G40909 Epilepsy, unspecified, not intractable, without status epilepticus: Secondary | ICD-10-CM | POA: Insufficient documentation

## 2016-07-27 DIAGNOSIS — Z87891 Personal history of nicotine dependence: Secondary | ICD-10-CM | POA: Insufficient documentation

## 2016-07-27 DIAGNOSIS — J45909 Unspecified asthma, uncomplicated: Secondary | ICD-10-CM | POA: Insufficient documentation

## 2016-07-27 LAB — COMPREHENSIVE METABOLIC PANEL
ALT: 25 U/L (ref 14–54)
AST: 25 U/L (ref 15–41)
Albumin: 4.4 g/dL (ref 3.5–5.0)
Alkaline Phosphatase: 47 U/L (ref 38–126)
Anion gap: 7 (ref 5–15)
BILIRUBIN TOTAL: 0.4 mg/dL (ref 0.3–1.2)
BUN: 7 mg/dL (ref 6–20)
CHLORIDE: 108 mmol/L (ref 101–111)
CO2: 23 mmol/L (ref 22–32)
CREATININE: 0.62 mg/dL (ref 0.44–1.00)
Calcium: 8.9 mg/dL (ref 8.9–10.3)
GFR calc Af Amer: >60 mL/min (ref >60)
Glucose, Bld: 83 mg/dL (ref 65–99)
Potassium: 3.3 mmol/L — ABNORMAL LOW (ref 3.5–5.1)
Sodium: 138 mmol/L (ref 135–145)
TOTAL PROTEIN: 7.2 g/dL (ref 6.5–8.1)

## 2016-07-27 LAB — URINALYSIS, ROUTINE W REFLEX MICROSCOPIC
BILIRUBIN URINE: NEGATIVE
GLUCOSE, UA: NEGATIVE mg/dL
Hgb urine dipstick: NEGATIVE
KETONES UR: NEGATIVE mg/dL
LEUKOCYTES UA: NEGATIVE
Nitrite: NEGATIVE
PROTEIN: NEGATIVE mg/dL
Specific Gravity, Urine: 1.013 (ref 1.005–1.030)
pH: 7 (ref 5.0–8.0)

## 2016-07-27 LAB — I-STAT BETA HCG BLOOD, ED (MC, WL, AP ONLY): I-stat hCG, quantitative: <5 m[IU]/mL (ref <5)

## 2016-07-27 LAB — CBC
HCT: 36.1 % (ref 36.0–46.0)
Hemoglobin: 12.2 g/dL (ref 12.0–15.0)
MCH: 29.4 pg (ref 26.0–34.0)
MCHC: 33.8 g/dL (ref 30.0–36.0)
MCV: 87 fL (ref 78.0–100.0)
PLATELETS: 279 10*3/uL (ref 150–400)
RBC: 4.15 MIL/uL (ref 3.87–5.11)
RDW: 14.6 % (ref 11.5–15.5)
WBC: 13.2 10*3/uL — AB (ref 4.0–10.5)

## 2016-07-27 LAB — LIPASE, BLOOD: Lipase: 16 U/L (ref 11–51)

## 2016-07-27 MED ORDER — MORPHINE SULFATE (PF) 4 MG/ML IV SOLN
4.0000 mg | Freq: Once | INTRAVENOUS | Status: AC
  Administered 2016-07-27: 4 mg via INTRAVENOUS
  Filled 2016-07-27: qty 1

## 2016-07-27 MED ORDER — POTASSIUM CHLORIDE CRYS ER 20 MEQ PO TBCR
40.0000 meq | EXTENDED_RELEASE_TABLET | Freq: Once | ORAL | Status: AC
  Administered 2016-07-27: 40 meq via ORAL
  Filled 2016-07-27: qty 2

## 2016-07-27 MED ORDER — PANTOPRAZOLE SODIUM 20 MG PO TBEC: 20.0000 mg | DELAYED_RELEASE_TABLET | Freq: Every day | ORAL | 1 refills | Status: DC

## 2016-07-27 MED ORDER — SODIUM CHLORIDE 0.9 % IV BOLUS (SEPSIS)
1000.0000 mL | Freq: Once | INTRAVENOUS | Status: AC
  Administered 2016-07-27: 1000 mL via INTRAVENOUS

## 2016-07-27 MED ORDER — FAMOTIDINE 20 MG PO TABS: 20.0000 mg | ORAL_TABLET | Freq: Two times a day (BID) | ORAL | 0 refills | Status: DC

## 2016-07-27 MED ORDER — ONDANSETRON HCL 4 MG/2ML IJ SOLN
4.0000 mg | Freq: Once | INTRAMUSCULAR | Status: AC
  Administered 2016-07-27: 4 mg via INTRAVENOUS
  Filled 2016-07-27: qty 2

## 2016-07-27 MED ORDER — ONDANSETRON 4 MG PO TBDP: 4.0000 mg | ORAL_TABLET | Freq: Three times a day (TID) | ORAL | 0 refills | Status: DC | PRN

## 2016-07-27 NOTE — ED Notes (Signed)
When nurse went to remove IV and discharge pt, pt was no longer in room. All pt's belongings were gone. Pt was searched for on premises and was not found. Pt did not receive discharge paperwork or prescriptions. Nurse did not remove IV before pt elopement

## 2016-07-27 NOTE — ED Triage Notes (Signed)
Pt c/o fever, RUQ abdominal pain, nausea, emesis, gradual onset about 10 days ago went to ED in [place], was told her liver was inflamed. Pt also reports several small fluid-filled vesicles to bilateral arms that appeared 2-3 days after ED visit, then burst and spread proximally. Hx pancreatitis.

## 2016-07-27 NOTE — Care Management (Signed)
CM received call from [facility] ED secretary concerning assistance with finding a PCP, patient is noted to e uninsuredPatient may follow up at the [facility] contact information placed on AVS to call after 9 am Monday 7/16. No further ED CM needs identified.

## 2016-07-27 NOTE — Discharge Instructions (Addendum)
There are no abnormalities noted on the ultrasound. Lab results are reassuring. A case management consult has been placed for you to help you select a primary care provider. Please also follow-up with gastroenterology and neurology. Call the numbers provided to set up these appointments.  Drink plenty of water to stay well-hydrated. Begin with a clear liquid diet, advance to a full liquid diet, and then a bland solid diet as you are able.  Begin taking the Protonix daily 20-30 minutes prior to your first meal of the day.  Take the Pepcid, as prescribed, for the next 5 days.  Zofran, as needed, for nausea/vomiting.  Ibuprofen or naproxen for pain.   Neurologist GI PCP

## 2016-07-27 NOTE — ED Notes (Signed)
Patient stated that she is unable to give urine specimen at this time but will let writer know when she is able to.

## 2016-07-27 NOTE — ED Notes (Signed)
Pt was last seen ambulatory without difficulty

## 2016-07-27 NOTE — ED Provider Notes (Signed)
[facility] DEPT Provider Note   CSN: [id number] Arrival date & time: 07/27/16  1440     History   Chief Complaint Chief Complaint  Patient presents with  . Abdominal Pain    HPI Lorenzo Pereyra is a 26 y.o. female.  HPI   Jose Alleyne is a 26 y.o. female, with a history of epilepsy, pancreatitis, asthma, DVT, and sickle cell trait, presenting to the ED with abdominal pain and vomiting. RUQ abdominal pain, constant, "feels like a clenched fist," 10/10, nonradiating. Pain worsens with eating and then is accompanied by nausea and occasional vomiting. Four episodes of emesis over last 24 hours, only with eating.  Timeline of symptoms: July 2: Began to have fatigue and chills.  July 3: Began to have RUQ pain "that felt like acid reflux." Accompanied by fever and vomiting.  July 5: Was admitted to a hospital for two days in [place], receiving IV fluids and ABX.  "I was told my liver was inflamed and I need to watch what I eat." July 6: Began to have a vesicular rash beginning on the anterior left arm. Rash is "slightly itchy" and burning when they open. Clear fluid upon rupture.  She left the hospital AMA because she states she wanted to come back home. She was not prescribed any medications at discharge. Rash has spread to her other extremities and abdomen. The fever resolved prior to discharge from the hospital. Patient returned to [facility] on July 11.  She has been taking 800 mg of ibuprofen daily since pain started. She states she takes ibuprofen occasionally for headaches.   Denies recurrent fever, diarrhea, SOB, CP, urinary complaints, or any other complaints.  LMP last week. Last BM two days ago and normal. Denies alcohol or illicit drug use. States she has been compliant with her antiseizure medication and does not need a refill currently.   States she has had bouts of pancreatitis in the past as a child and in adulthood with last instance 3 months ago. She used to have a GI specialist  while in [place], but has not established any regular care providers here in [place]. Last saw her GI "at least 9 months ago."    Past Medical History:  Diagnosis Date  . Asthma   . Complication of anesthesia    "I wake up during anesthesia" (10/14/2015)  . DVT (deep vein thrombosis) in pregnancy ([place])   . Epilepsy ([place])   . GERD (gastroesophageal reflux disease)   . Heart murmur    last work up age 72- no symtoms  . Pancreatitis   . Seizures ([place])    epilepsy  . Sickle cell trait ([place])   . Sleep apnea    "used to wear CPAP; don't have one here in [place] since I moved in 2014" (10/14/2015)    Patient Active Problem List   Diagnosis Date Noted  . Calculus of bile duct with acute cholecystitis with obstruction 10/14/2015  . Adjustment disorder with mixed anxiety and depressed mood 04/26/2015  . Hypokalemia 04/19/2015  . Asthma with acute exacerbation 04/19/2015  . Asthma exacerbation 01/03/2015  . Influenza-like illness 01/03/2015  . Status post cesarean section 11/08/2014  . Previous cesarean section complicating pregnancy, antepartum condition or complication   . GERD (gastroesophageal reflux disease)   . Gastroesophageal reflux disease without esophagitis   . Abnormal biochemical finding on antenatal screening of mother   . Abnormal quad screen   . Echogenic focus of heart of fetus affecting antepartum care of mother   .  Drug use affecting pregnancy, antepartum 05/19/2014  . Seizure disorder during pregnancy, antepartum ([place]) 05/13/2014  . Supervision of high risk pregnancy, antepartum 05/13/2014  . Asthma complicating pregnancy, antepartum 05/13/2014  . History of food anaphylaxis 05/13/2014  . Seizure disorder, sept 2015 last seizure 01/19/2013    Past Surgical History:  Procedure Laterality Date  . APPENDECTOMY    . CESAREAN SECTION  2013  . CESAREAN SECTION N/A 11/08/2014   Procedure: CESAREAN SECTION;  Surgeon: Truett Mainland, DO;  Location: [facility];  Service: Obstetrics;   Laterality: N/A;  . CHOLECYSTECTOMY N/A 10/16/2015   Procedure: LAPAROSCOPIC CHOLECYSTECTOMY WITH POSSIBLE INTRAOPERATIVE CHOLANGIOGRAM;  Surgeon: Donnie Mesa, MD;  Location: [facility];  Service: General;  Laterality: N/A;  . INGUINAL HERNIA REPAIR Bilateral ~ 1996  . NERVE, TENDON AND ARTERY REPAIR Right 09/23/2012   Procedure: I&D and Repair As Necessary/Right Hand and Palm;  Surgeon: Roseanne Kaufman, MD;  Location: [facility];  Service: Orthopedics;  Laterality: Right;  . TONSILLECTOMY      OB History    Gravida Para Term Preterm AB Living   3 3 3     3    SAB TAB Ectopic Multiple Live Births         0 3       Home Medications    Prior to Admission medications   Medication Sig Start Date End Date Taking? Authorizing Provider  ibuprofen (ADVIL,MOTRIN) 200 MG tablet Take 200-400 mg by mouth every 6 (six) hours as needed for moderate pain.   Yes [provider]  levETIRAcetam (KEPPRA) 500 MG tablet Take 1 tablet (500 mg total) by mouth 2 (two) times daily. 09/22/15  Yes Langeland, Dawn T, MD  albuterol (PROVENTIL HFA;VENTOLIN HFA) 108 (90 Base) MCG/ACT inhaler Inhale 2 puffs into the lungs every 4 (four) hours as needed for wheezing or shortness of breath. 09/22/15   Langeland, Leda Quail, MD  albuterol (PROVENTIL) (2.5 MG/3ML) 0.083% nebulizer solution Take 3 mLs (2.5 mg total) by nebulization every 4 (four) hours as needed for wheezing or shortness of breath. 09/22/15   Maren Reamer, MD  famotidine (PEPCID) 20 MG tablet Take 1 tablet (20 mg total) by mouth 2 (two) times daily. 07/27/16 08/01/16  Omesha Bowerman C, PA-C  Ibuprofen-Diphenhydramine Cit (ADVIL PM) 200-38 MG TABS Take 2 tablets by mouth at bedtime as needed (for sleep/pain).     [provider]  ondansetron (ZOFRAN ODT) 4 MG disintegrating tablet Take 1 tablet (4 mg total) by mouth every 8 (eight) hours as needed for nausea or vomiting. 07/27/16   Ilyas Lipsitz C, PA-C  oxyCODONE (ROXICODONE) 5 MG immediate release tablet Take 1  tablet (5 mg total) by mouth every 4 (four) hours as needed for severe pain. 06/23/16   Nona Dell, PA-C  pantoprazole (PROTONIX) 20 MG tablet Take 1 tablet (20 mg total) by mouth daily. 07/27/16   Raegan Sipp, Helane Gunther, PA-C    Family History Family History  Problem Relation Age of Onset  . Cancer Mother        cervical cancer  . Asthma Mother   . Hypertension Father   . Sickle cell anemia Father   . Asthma Sister   . Diabetes Maternal Aunt   . Cancer Maternal Grandmother     Social History Social History  Substance Use Topics  . Smoking status: Former Smoker    Packs/day: 0.25    Years: 4.00    Types: Cigarettes    Quit date: 10/02/2015  .  Smokeless tobacco: Never Used  . Alcohol use 0.0 oz/week     Comment: 10/14/2015 "might have a few drinks/year; big parties, holidays"     Allergies   Cheese; Chocolate; Ibuprofen; Ivp dye [iodinated diagnostic agents]; Latex; Orange juice [orange oil]; Other; Peach [prunus persica]; Peanuts [peanut oil]; Pear; Prednisone; Raspberry; Tylenol [acetaminophen]; Amoxicillin; Apricot flavor; Doxycycline; Erythromycin; Milk of magnesia [magnesium hydroxide]; and Penicillins   Review of Systems Review of Systems  Constitutional: Positive for appetite change, chills (resolved), fatigue (resolved) and fever (resolved).  HENT: Negative for sore throat and trouble swallowing.   Respiratory: Negative for cough and shortness of breath.   Cardiovascular: Negative for chest pain.  Gastrointestinal: Positive for abdominal pain, nausea and vomiting. Negative for blood in stool, constipation and diarrhea.  Genitourinary: Negative for dysuria, hematuria and pelvic pain.  Musculoskeletal: Negative for back pain and neck pain.  Skin: Positive for rash.  Neurological: Negative for dizziness, syncope, weakness, light-headedness, numbness and headaches.  All other systems reviewed and are negative.    Physical Exam Updated Vital Signs BP (!) 145/116  (BP Location: Right Arm)   Pulse (!) 107   Temp 98.7 F (37.1 C) (Oral)   Resp 17   Ht 5\' 4"  (1.626 m)   Wt 71.8 kg (158 lb 4 oz)   SpO2 100%   BMI 27.16 kg/m   Physical Exam  Constitutional: She appears well-developed and well-nourished. No distress.  HENT:  Head: Normocephalic and atraumatic.  Mouth/Throat: Oropharynx is clear and moist.  No intraoral lesions noted.   Eyes: Pupils are equal, round, and reactive to light. Conjunctivae and EOM are normal.  Neck: Neck supple.  Cardiovascular: Normal rate, regular rhythm, normal heart sounds and intact distal pulses.   Pulmonary/Chest: Effort normal and breath sounds normal. No respiratory distress.  Abdominal: Soft. She exhibits no distension. There is tenderness in the right upper quadrant. There is CVA tenderness (right). There is no guarding.  Musculoskeletal: She exhibits no edema or tenderness.  No pain or tenderness noted in the major joints of the upper and lower extremities.   Lymphadenopathy:    She has no cervical adenopathy.  Neurological: She is alert.  No sensory deficits. Strength 5/5 in all extremities. No gait disturbance. Coordination intact including heel to shin and finger to nose. Cranial nerves III-XII grossly intact. No facial droop.   Skin: Skin is warm and dry. Rash noted. She is not diaphoretic.  Fine, erythematous, maculopapular rash noted mostly to the bilateral upper extremity flexor surfaces. No vesicles or pustules noted.   Psychiatric: She has a normal mood and affect. Her behavior is normal.  Nursing note and vitals reviewed.    ED Treatments / Results  Labs (all labs ordered are listed, but only abnormal results are displayed) Labs Reviewed  COMPREHENSIVE METABOLIC PANEL - Abnormal; Notable for the following:       Result Value   Potassium 3.3 (*)    All other components within normal limits  CBC - Abnormal; Notable for the following:    WBC 13.2 (*)    All other components within normal  limits  URINALYSIS, ROUTINE W REFLEX MICROSCOPIC - Abnormal; Notable for the following:    APPearance HAZY (*)    All other components within normal limits  LIPASE, BLOOD  I-STAT BETA HCG BLOOD, ED ([facility], [facility], [facility] ONLY)    EKG  EKG Interpretation None       Radiology US Abdomen Limited Ruq  Result Date: 07/27/2016 CLINICAL DATA:  Right upper quadrant pain for 10 days. EXAM: ULTRASOUND ABDOMEN LIMITED RIGHT UPPER QUADRANT COMPARISON:  Abdominal ultrasound right upper quadrant ultrasound 10/03/2015. CT abdomen and pelvis 04/21/2015 FINDINGS: Gallbladder: Removed. Common bile duct: Diameter: 0.7 cm. Liver: No focal lesion identified. Within normal limits in parenchymal echogenicity. IMPRESSION: Status post cholecystectomy.  The exam is otherwise negative. Electronically Signed   By: Inge Rise M.D.   On: 07/27/2016 18:33    Procedures Procedures (including critical care time)  Medications Ordered in ED Medications  potassium chloride SA (K-DUR,KLOR-CON) CR tablet 40 mEq (40 mEq Oral Given 07/27/16 1719)  sodium chloride 0.9 % bolus 1,000 mL (0 mLs Intravenous Stopped 07/27/16 1935)  ondansetron (ZOFRAN) injection 4 mg (4 mg Intravenous Given 07/27/16 1726)  morphine 4 MG/ML injection 4 mg (4 mg Intravenous Given 07/27/16 1726)     Initial Impression / Assessment and Plan / ED Course  I have reviewed the triage vital signs and the nursing notes.  Pertinent labs & imaging results that were available during my care of the patient were reviewed by me and considered in my medical decision making (see chart for details).  Clinical Course as of Jul 27 2009  Fri Jul 27, 2016  2248 Patient states she feels much better. Pain is now 4/10.   [SJ]    Clinical Course User Index [SJ] Marquis Diles C, PA-C   Patient presents with continued, but improving abdominal pain and vomiting. Patient's symptoms have a biliary colic type pattern. Patient is nontoxic appearing, afebrile, not tachycardic on  my exam, not tachypneic, not hypotensive, maintains SPO2 of 100% on room air, and is in no apparent distress. Lab results reassuring. Ultrasound without acute abnormality. Case management consult placed to help patient follow up with PCP. Neuro and GI follow-up. The patient was given instructions for home care, to include dietary changes, as well as return precautions. Patient voices understanding of these instructions, accepts the plan, and is comfortable with discharge.   Findings and plan of care discussed with Carmin Muskrat, MD.   Vitals:   07/27/16 1457 07/27/16 1745  BP: (!) 145/116 (!) 159/136  Pulse: (!) 107 88  Resp: 17 12  Temp: 98.7 F (37.1 C)   TempSrc: Oral   SpO2: 100% 100%  Weight: 71.8 kg (158 lb 4 oz)   Height: 5\' 4"  (1.626 m)      Final Clinical Impressions(s) / ED Diagnoses   Final diagnoses:  RUQ pain    New Prescriptions Discharge Medication List as of 07/27/2016  7:05 PM    START taking these medications   Details  famotidine (PEPCID) 20 MG tablet Take 1 tablet (20 mg total) by mouth 2 (two) times daily., Starting Fri 07/27/2016, Until Wed 08/01/2016, Print    ondansetron (ZOFRAN ODT) 4 MG disintegrating tablet Take 1 tablet (4 mg total) by mouth every 8 (eight) hours as needed for nausea or vomiting., Starting Fri 07/27/2016, Print    pantoprazole (PROTONIX) 20 MG tablet Take 1 tablet (20 mg total) by mouth daily., Starting Fri 07/27/2016, Print         Brexlee Heberlein, Helane Gunther, PA-C 07/27/16 2011    Carmin Muskrat, MD 07/27/16 2352

## 2016-09-21 ENCOUNTER — Emergency Department (HOSPITAL_COMMUNITY)
Admission: EM | Admit: 2016-09-21 | Discharge: 2016-09-21 | Disposition: A | Discharge status: Home / Self Care | Payer: Medicaid Other | Attending: Emergency Medicine | Admitting: Emergency Medicine

## 2016-09-21 DIAGNOSIS — Z5321 Procedure and treatment not carried out due to patient leaving prior to being seen by health care provider: Secondary | ICD-10-CM | POA: Insufficient documentation

## 2016-09-21 DIAGNOSIS — R51 Headache: Secondary | ICD-10-CM | POA: Insufficient documentation

## 2016-09-21 NOTE — ED Notes (Signed)
No answer in waiting area.
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
## 2016-09-21 NOTE — ED Notes (Signed)
No answer for treatment room. 

## 2016-10-18 ENCOUNTER — Emergency Department (HOSPITAL_COMMUNITY): Admission: EM | Admit: 2016-10-18 | Discharge: 2016-10-18 | Discharge status: Against Medical Advice | Payer: Self-pay

## 2016-10-18 NOTE — ED Notes (Signed)
No response when called from lobby for triage.

## 2016-10-18 NOTE — ED Notes (Signed)
No response when called from lobby. 

## 2016-10-27 ENCOUNTER — Emergency Department (HOSPITAL_COMMUNITY)
Admission: EM | Admit: 2016-10-27 | Discharge: 2016-10-27 | Discharge status: Against Medical Advice | Payer: No Typology Code available for payment source

## 2016-10-27 NOTE — ED Notes (Signed)
Pt has been called multiple times with no answer in lobby.  

## 2016-11-01 ENCOUNTER — Emergency Department (HOSPITAL_COMMUNITY)
Admission: EM | Admit: 2016-11-01 | Discharge: 2016-11-02 | Discharge status: Against Medical Advice | Payer: No Typology Code available for payment source

## 2016-11-01 NOTE — ED Notes (Signed)
Pt's name called for triage x2 by Chrissie Noa, RN.  No response.

## 2016-11-01 NOTE — ED Notes (Signed)
Called x2 for triage and no answer.

## 2016-11-12 ENCOUNTER — Encounter (HOSPITAL_COMMUNITY): Payer: Self-pay | Admitting: Emergency Medicine

## 2016-11-12 ENCOUNTER — Inpatient Hospital Stay (HOSPITAL_COMMUNITY)
Admission: EM | Admit: 2016-11-12 | Discharge: 2016-11-17 | DRG: 392 | Disposition: A | Discharge status: Home / Self Care | Payer: Medicaid Other | Attending: Family Medicine | Admitting: Family Medicine

## 2016-11-12 ENCOUNTER — Emergency Department (HOSPITAL_COMMUNITY): Payer: Medicaid Other

## 2016-11-12 DIAGNOSIS — Z91041 Radiographic dye allergy status: Secondary | ICD-10-CM

## 2016-11-12 DIAGNOSIS — F1290 Cannabis use, unspecified, uncomplicated: Secondary | ICD-10-CM | POA: Diagnosis present

## 2016-11-12 DIAGNOSIS — Z832 Family history of diseases of the blood and blood-forming organs and certain disorders involving the immune mechanism: Secondary | ICD-10-CM

## 2016-11-12 DIAGNOSIS — E876 Hypokalemia: Secondary | ICD-10-CM | POA: Diagnosis present

## 2016-11-12 DIAGNOSIS — Z87891 Personal history of nicotine dependence: Secondary | ICD-10-CM

## 2016-11-12 DIAGNOSIS — Z9049 Acquired absence of other specified parts of digestive tract: Secondary | ICD-10-CM

## 2016-11-12 DIAGNOSIS — Z79899 Other long term (current) drug therapy: Secondary | ICD-10-CM

## 2016-11-12 DIAGNOSIS — R569 Unspecified convulsions: Secondary | ICD-10-CM

## 2016-11-12 DIAGNOSIS — Z880 Allergy status to penicillin: Secondary | ICD-10-CM

## 2016-11-12 DIAGNOSIS — Z86718 Personal history of other venous thrombosis and embolism: Secondary | ICD-10-CM

## 2016-11-12 DIAGNOSIS — D573 Sickle-cell trait: Secondary | ICD-10-CM | POA: Diagnosis present

## 2016-11-12 DIAGNOSIS — R1011 Right upper quadrant pain: Secondary | ICD-10-CM

## 2016-11-12 DIAGNOSIS — R112 Nausea with vomiting, unspecified: Secondary | ICD-10-CM

## 2016-11-12 DIAGNOSIS — Z91018 Allergy to other foods: Secondary | ICD-10-CM

## 2016-11-12 DIAGNOSIS — G4730 Sleep apnea, unspecified: Secondary | ICD-10-CM | POA: Diagnosis present

## 2016-11-12 DIAGNOSIS — J45909 Unspecified asthma, uncomplicated: Secondary | ICD-10-CM | POA: Diagnosis present

## 2016-11-12 DIAGNOSIS — K529 Noninfective gastroenteritis and colitis, unspecified: Principal | ICD-10-CM | POA: Diagnosis present

## 2016-11-12 DIAGNOSIS — Z886 Allergy status to analgesic agent status: Secondary | ICD-10-CM

## 2016-11-12 DIAGNOSIS — Z91040 Latex allergy status: Secondary | ICD-10-CM

## 2016-11-12 DIAGNOSIS — Z91010 Allergy to peanuts: Secondary | ICD-10-CM

## 2016-11-12 DIAGNOSIS — Z8249 Family history of ischemic heart disease and other diseases of the circulatory system: Secondary | ICD-10-CM

## 2016-11-12 DIAGNOSIS — K219 Gastro-esophageal reflux disease without esophagitis: Secondary | ICD-10-CM | POA: Diagnosis present

## 2016-11-12 DIAGNOSIS — G40909 Epilepsy, unspecified, not intractable, without status epilepticus: Secondary | ICD-10-CM | POA: Diagnosis present

## 2016-11-12 DIAGNOSIS — Z881 Allergy status to other antibiotic agents status: Secondary | ICD-10-CM

## 2016-11-12 DIAGNOSIS — G8929 Other chronic pain: Secondary | ICD-10-CM | POA: Diagnosis present

## 2016-11-12 LAB — I-STAT BETA HCG BLOOD, ED (MC, WL, AP ONLY): I-stat hCG, quantitative: <5 m[IU]/mL (ref <5)

## 2016-11-12 LAB — CBC
HCT: 40.8 % (ref 36.0–46.0)
HEMOGLOBIN: 14 g/dL (ref 12.0–15.0)
MCH: 30.5 pg (ref 26.0–34.0)
MCHC: 34.3 g/dL (ref 30.0–36.0)
MCV: 88.9 fL (ref 78.0–100.0)
Platelets: 226 10*3/uL (ref 150–400)
RBC: 4.59 MIL/uL (ref 3.87–5.11)
RDW: 14 % (ref 11.5–15.5)
WBC: 11.5 10*3/uL — ABNORMAL HIGH (ref 4.0–10.5)

## 2016-11-12 LAB — COMPREHENSIVE METABOLIC PANEL
ALBUMIN: 3.7 g/dL (ref 3.5–5.0)
ALT: 17 U/L (ref 14–54)
AST: 27 U/L (ref 15–41)
Alkaline Phosphatase: 36 U/L — ABNORMAL LOW (ref 38–126)
Anion gap: 10 (ref 5–15)
BUN: 6 mg/dL (ref 6–20)
CO2: 21 mmol/L — AB (ref 22–32)
CREATININE: 0.79 mg/dL (ref 0.44–1.00)
Calcium: 8.5 mg/dL — ABNORMAL LOW (ref 8.9–10.3)
Chloride: 107 mmol/L (ref 101–111)
GFR calc non Af Amer: >60 mL/min (ref >60)
GLUCOSE: 122 mg/dL — AB (ref 65–99)
Potassium: 2.8 mmol/L — ABNORMAL LOW (ref 3.5–5.1)
SODIUM: 138 mmol/L (ref 135–145)
Total Bilirubin: 0.5 mg/dL (ref 0.3–1.2)
Total Protein: 6.2 g/dL — ABNORMAL LOW (ref 6.5–8.1)

## 2016-11-12 LAB — I-STAT CG4 LACTIC ACID, ED: Lactic Acid, Venous: 1.24 mmol/L (ref 0.5–1.9)

## 2016-11-12 LAB — BASIC METABOLIC PANEL
Anion gap: 9 (ref 5–15)
BUN: <5 mg/dL — ABNORMAL LOW (ref 6–20)
CO2: 16 mmol/L — ABNORMAL LOW (ref 22–32)
Calcium: 7.8 mg/dL — ABNORMAL LOW (ref 8.9–10.3)
Chloride: 115 mmol/L — ABNORMAL HIGH (ref 101–111)
Creatinine, Ser: 0.56 mg/dL (ref 0.44–1.00)
GFR calc Af Amer: >60 mL/min (ref >60)
Glucose, Bld: 101 mg/dL — ABNORMAL HIGH (ref 65–99)
Potassium: 4 mmol/L (ref 3.5–5.1)
SODIUM: 140 mmol/L (ref 135–145)

## 2016-11-12 LAB — URINALYSIS, ROUTINE W REFLEX MICROSCOPIC
BILIRUBIN URINE: NEGATIVE
Glucose, UA: NEGATIVE mg/dL
HGB URINE DIPSTICK: NEGATIVE
Ketones, ur: NEGATIVE mg/dL
Leukocytes, UA: NEGATIVE
Nitrite: NEGATIVE
PH: 6 (ref 5.0–8.0)
Protein, ur: NEGATIVE mg/dL
SPECIFIC GRAVITY, URINE: 1.015 (ref 1.005–1.030)

## 2016-11-12 LAB — MAGNESIUM: Magnesium: 1.8 mg/dL (ref 1.7–2.4)

## 2016-11-12 LAB — PHOSPHORUS: PHOSPHORUS: 2.2 mg/dL — AB (ref 2.5–4.6)

## 2016-11-12 LAB — LIPASE, BLOOD: LIPASE: 15 U/L (ref 11–51)

## 2016-11-12 MED ORDER — PROMETHAZINE HCL 25 MG/ML IJ SOLN
25.0000 mg | Freq: Once | INTRAMUSCULAR | Status: AC
  Administered 2016-11-12: 25 mg via INTRAVENOUS
  Filled 2016-11-12: qty 1

## 2016-11-12 MED ORDER — CIPROFLOXACIN IN D5W 400 MG/200ML IV SOLN
400.0000 mg | Freq: Once | INTRAVENOUS | Status: AC
  Administered 2016-11-12: 400 mg via INTRAVENOUS
  Filled 2016-11-12: qty 200

## 2016-11-12 MED ORDER — MORPHINE SULFATE (PF) 4 MG/ML IV SOLN
4.0000 mg | INTRAVENOUS | Status: DC | PRN
  Administered 2016-11-12 – 2016-11-16 (×27): 4 mg via INTRAVENOUS
  Filled 2016-11-12 (×27): qty 1

## 2016-11-12 MED ORDER — MORPHINE SULFATE (PF) 4 MG/ML IV SOLN
4.0000 mg | Freq: Once | INTRAVENOUS | Status: AC
  Administered 2016-11-12: 4 mg via INTRAVENOUS
  Filled 2016-11-12: qty 1

## 2016-11-12 MED ORDER — ONDANSETRON 4 MG PO TBDP: 4.0000 mg | ORAL_TABLET | Freq: Once | ORAL | Status: DC | PRN

## 2016-11-12 MED ORDER — POTASSIUM CHLORIDE CRYS ER 20 MEQ PO TBCR
40.0000 meq | EXTENDED_RELEASE_TABLET | Freq: Once | ORAL | Status: AC
  Administered 2016-11-12: 40 meq via ORAL
  Filled 2016-11-12: qty 2

## 2016-11-12 MED ORDER — ENOXAPARIN SODIUM 40 MG/0.4ML ~~LOC~~ SOLN
40.0000 mg | SUBCUTANEOUS | Status: DC
  Administered 2016-11-12 – 2016-11-16 (×5): 40 mg via SUBCUTANEOUS
  Filled 2016-11-12 (×5): qty 0.4

## 2016-11-12 MED ORDER — SODIUM CHLORIDE 0.9 % IV SOLN
500.0000 mg | Freq: Once | INTRAVENOUS | Status: AC
  Administered 2016-11-12: 500 mg via INTRAVENOUS
  Filled 2016-11-12: qty 5

## 2016-11-12 MED ORDER — CIPROFLOXACIN IN D5W 400 MG/200ML IV SOLN
400.0000 mg | Freq: Two times a day (BID) | INTRAVENOUS | Status: DC
  Administered 2016-11-12 – 2016-11-16 (×8): 400 mg via INTRAVENOUS
  Filled 2016-11-12 (×8): qty 200

## 2016-11-12 MED ORDER — SODIUM CHLORIDE 0.9 % IV BOLUS (SEPSIS)
1000.0000 mL | Freq: Once | INTRAVENOUS | Status: AC
  Administered 2016-11-12: 1000 mL via INTRAVENOUS

## 2016-11-12 MED ORDER — METRONIDAZOLE IN NACL 5-0.79 MG/ML-% IV SOLN: 500.0000 mg | Freq: Once | INTRAVENOUS | Status: DC

## 2016-11-12 MED ORDER — LORAZEPAM 2 MG/ML IJ SOLN
1.0000 mg | INTRAMUSCULAR | Status: DC | PRN
  Administered 2016-11-13: 2 mg via INTRAVENOUS
  Filled 2016-11-12 (×2): qty 1

## 2016-11-12 MED ORDER — SODIUM CHLORIDE 0.9% FLUSH
3.0000 mL | Freq: Two times a day (BID) | INTRAVENOUS | Status: DC
  Administered 2016-11-12 – 2016-11-16 (×4): 3 mL via INTRAVENOUS

## 2016-11-12 MED ORDER — PANTOPRAZOLE SODIUM 40 MG IV SOLR: 40.0000 mg | INTRAVENOUS | Status: DC

## 2016-11-12 MED ORDER — CIPROFLOXACIN HCL 500 MG PO TABS
500.0000 mg | ORAL_TABLET | Freq: Once | ORAL | Status: DC
Start: 2016-11-12 — End: 2016-11-12
  Filled 2016-11-12: qty 1

## 2016-11-12 MED ORDER — LEVETIRACETAM 500 MG PO TABS: 500.0000 mg | ORAL_TABLET | Freq: Two times a day (BID) | ORAL | Status: DC

## 2016-11-12 MED ORDER — PANTOPRAZOLE SODIUM 40 MG IV SOLR
40.0000 mg | Freq: Once | INTRAVENOUS | Status: AC
  Administered 2016-11-12: 40 mg via INTRAVENOUS
  Filled 2016-11-12: qty 40

## 2016-11-12 MED ORDER — ACETAMINOPHEN 650 MG RE SUPP
650.0000 mg | Freq: Four times a day (QID) | RECTAL | Status: DC | PRN
  Filled 2016-11-12: qty 1

## 2016-11-12 MED ORDER — ONDANSETRON HCL 4 MG/2ML IJ SOLN
4.0000 mg | Freq: Four times a day (QID) | INTRAMUSCULAR | Status: DC | PRN
  Administered 2016-11-12 – 2016-11-16 (×3): 4 mg via INTRAVENOUS
  Filled 2016-11-12 (×4): qty 2

## 2016-11-12 MED ORDER — SODIUM CHLORIDE 0.9 % IV SOLN
500.0000 mg | Freq: Two times a day (BID) | INTRAVENOUS | Status: DC
  Administered 2016-11-13 – 2016-11-16 (×8): 500 mg via INTRAVENOUS
  Filled 2016-11-12 (×10): qty 5

## 2016-11-12 MED ORDER — METRONIDAZOLE IN NACL 5-0.79 MG/ML-% IV SOLN
500.0000 mg | Freq: Once | INTRAVENOUS | Status: AC
  Administered 2016-11-12: 500 mg via INTRAVENOUS
  Filled 2016-11-12: qty 100

## 2016-11-12 MED ORDER — HYDRALAZINE HCL 20 MG/ML IJ SOLN
10.0000 mg | Freq: Three times a day (TID) | INTRAMUSCULAR | Status: DC | PRN
  Administered 2016-11-16: 10 mg via INTRAVENOUS
  Filled 2016-11-12: qty 1

## 2016-11-12 MED ORDER — CIPROFLOXACIN IN D5W 400 MG/200ML IV SOLN: 400.0000 mg | Freq: Once | INTRAVENOUS | Status: DC

## 2016-11-12 MED ORDER — CIPROFLOXACIN HCL 500 MG PO TABS: 500.0000 mg | ORAL_TABLET | Freq: Once | ORAL | Status: DC

## 2016-11-12 MED ORDER — SODIUM CHLORIDE 0.9 % IV SOLN
INTRAVENOUS | Status: AC
  Administered 2016-11-12 (×2): via INTRAVENOUS

## 2016-11-12 MED ORDER — POTASSIUM CHLORIDE 10 MEQ/100ML IV SOLN
10.0000 meq | INTRAVENOUS | Status: AC
  Administered 2016-11-12: 10 meq via INTRAVENOUS
  Filled 2016-11-12: qty 100

## 2016-11-12 MED ORDER — POTASSIUM CHLORIDE 10 MEQ/100ML IV SOLN
10.0000 meq | INTRAVENOUS | Status: AC
  Administered 2016-11-12 (×2): 10 meq via INTRAVENOUS
  Filled 2016-11-12 (×4): qty 100

## 2016-11-12 MED ORDER — ACETAMINOPHEN 325 MG PO TABS: 650.0000 mg | ORAL_TABLET | Freq: Four times a day (QID) | ORAL | Status: DC | PRN

## 2016-11-12 MED ORDER — METRONIDAZOLE IN NACL 5-0.79 MG/ML-% IV SOLN
500.0000 mg | Freq: Three times a day (TID) | INTRAVENOUS | Status: DC
  Administered 2016-11-12 – 2016-11-16 (×12): 500 mg via INTRAVENOUS
  Filled 2016-11-12 (×16): qty 100

## 2016-11-12 MED ORDER — METRONIDAZOLE 500 MG PO TABS: 500.0000 mg | ORAL_TABLET | Freq: Once | ORAL | Status: DC

## 2016-11-12 MED ORDER — ALBUTEROL SULFATE (2.5 MG/3ML) 0.083% IN NEBU: 2.5000 mg | INHALATION_SOLUTION | RESPIRATORY_TRACT | Status: DC | PRN

## 2016-11-12 MED ORDER — ONDANSETRON HCL 4 MG/2ML IJ SOLN
4.0000 mg | Freq: Once | INTRAMUSCULAR | Status: AC
  Administered 2016-11-12: 4 mg via INTRAVENOUS
  Filled 2016-11-12: qty 2

## 2016-11-12 MED ORDER — PANTOPRAZOLE SODIUM 40 MG IV SOLR
40.0000 mg | INTRAVENOUS | Status: DC
  Administered 2016-11-13 – 2016-11-16 (×4): 40 mg via INTRAVENOUS
  Filled 2016-11-12 (×4): qty 40

## 2016-11-12 MED ORDER — METRONIDAZOLE 500 MG PO TABS
500.0000 mg | ORAL_TABLET | Freq: Once | ORAL | Status: DC
  Filled 2016-11-12: qty 1

## 2016-11-12 NOTE — ED Notes (Signed)
Call report Jasmine [name] [phone] @ [phone]

## 2016-11-12 NOTE — ED Provider Notes (Signed)
  Physical Exam  BP 132/88   Pulse 80   Temp 99.3 F (37.4 C) (Oral)   Resp (!) 22   Ht 5\' 5"  (1.651 m)   Wt 68.5 kg (151 lb)   LMP 11/08/2016   SpO2 100%   BMI 25.13 kg/m   Physical Exam  ED Course  Procedures  MDM Care assumed at 7 am. Patient had vomiting, abdominal pain. RUQ US unremarkable but patient had persistent RUQ pain and vomiting so CT ab/pel ordered and pending at sign out.   10 AM CT showed colitis. WBC 11. Patient still vomiting despite zofran. Will give phenergan, cipro,flagyl. And attempt to PO trial.   11:34 AM Vomited again after phenergan. Unable to keep down oral abx. Will give cipro/flagyl IV. K 2.8, supplemented. Hospitalist to admit for intractable vomiting likely from colitis.     Drenda Freeze, MD 11/12/16 [phone]

## 2016-11-12 NOTE — H&P (Addendum)
[facility] History and Physical  Jasmine [name] DOA: 11/12/2016  Referring physician:  PCP: Patient, No Pcp Per   Chief Complaint: "I just could not take it anymore."  HPI: Jasmine [name] is a 26 y.o. 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  Patient states before come to the emergency room she had 2 days plus of nausea vomiting abdominal pain.  She thought it was her regular pancreatitis and tried to tough it out at home.  Eventually she was unable to tolerate fluids orally.  She began to have racing heartbeat and skipped beats.  This alarmed her.  Family member saw her heart rate was in the 120s when checked.  So the patient came to the emergency room for evaluation.  ED course: Patient given multiple doses of antiemetics and IV fluids.  Continued to vomit.  Right upper quadrant ultrasound negative.  CT the abdomen pelvis showed colitis.  Hospitalist consulted for admission.   Review of Systems:  As per HPI otherwise 10 point review of systems negative.    Past Medical History:  Diagnosis Date  . Asthma   . Complication of anesthesia    "I wake up during anesthesia" (10/14/2015)  . DVT (deep vein thrombosis) in pregnancy ([place])   . Epilepsy ([place])   . GERD (gastroesophageal reflux disease)   . Heart murmur    last work up age 70- no symtoms  . Pancreatitis   . Seizures ([place])    epilepsy  . Sickle cell trait ([place])   . Sleep apnea    "used to wear CPAP; don't have one here in [place] since I moved in 2014" (10/14/2015)   Past Surgical History:  Procedure Laterality Date  . APPENDECTOMY    . CESAREAN SECTION  2013  . CESAREAN SECTION N/A 11/08/2014   Procedure: CESAREAN SECTION;  Surgeon: Truett Mainland, DO;  Location: [facility];  Service: Obstetrics;  Laterality: N/A;   . CHOLECYSTECTOMY N/A 10/16/2015   Procedure: LAPAROSCOPIC CHOLECYSTECTOMY WITH POSSIBLE INTRAOPERATIVE CHOLANGIOGRAM;  Surgeon: Donnie Mesa, MD;  Location: [facility];  Service: General;  Laterality: N/A;  . INGUINAL HERNIA REPAIR Bilateral ~ 1996  . NERVE, TENDON AND ARTERY REPAIR Right 09/23/2012   Procedure: I&D and Repair As Necessary/Right Hand and Palm;  Surgeon: Roseanne Kaufman, MD;  Location: [facility];  Service: Orthopedics;  Laterality: Right;  . TONSILLECTOMY     Social History:  reports that she quit smoking about 13 months ago. Her smoking use included Cigarettes. She has a 1.00 pack-year smoking history. She has never used smokeless tobacco. She reports that she drinks alcohol. She reports that she does not use drugs.  Allergies  Allergen Reactions  . Cheese Shortness Of Breath  . Chocolate Shortness Of Breath  . Ibuprofen Hives and Shortness Of Breath    Children's ibuprofen  . Ivp Dye [Iodinated Diagnostic Agents] Shortness Of Breath  . Latex Anaphylaxis, Swelling and Other (See Comments)    Reaction:  Localized swelling   . Orange Juice [Orange Oil] Shortness Of Breath  . Other Hives and Other (See Comments)    Pt states that she is allergic to all steroids except IV solu-medrol.    Marland Kitchen Peach [Prunus Persica] Anaphylaxis  . Peanuts [Peanut Oil] Anaphylaxis  . Pear Anaphylaxis  . Prednisone Hives  . Raspberry Anaphylaxis  . Tylenol [Acetaminophen] Hives, Shortness  Of Breath and Other (See Comments)    Pt states that this is only with the liquid form.    . Amoxicillin Hives and Other (See Comments)    Has patient had a PCN reaction causing immediate rash, facial/tongue/throat swelling, SOB or lightheadedness with hypotension: No Has patient had a PCN reaction causing severe rash involving mucus membranes or skin necrosis: No Has patient had a PCN reaction that required hospitalization: No Has patient had a PCN reaction occurring within the last 10 years: No If all of the above  answers are "NO", then may proceed with Cephalosporin use.  Marland Kitchen Apricot Flavor Hives  . Doxycycline Hives  . Erythromycin Hives  . Milk Of Magnesia [Magnesium Hydroxide] Hives and Itching  . Penicillins Hives and Other (See Comments)    Has patient had a PCN reaction causing immediate rash, facial/tongue/throat swelling, SOB or lightheadedness with hypotension: No Has patient had a PCN reaction causing severe rash involving mucus membranes or skin necrosis: No Has patient had a PCN reaction that required hospitalization: No Has patient had a PCN reaction occurring within the last 10 years: No If all of the above answers are "NO", then may proceed with Cephalosporin use.    Family History  Problem Relation Age of Onset  . Cancer Mother        cervical cancer  . Asthma Mother   . Hypertension Father   . Sickle cell anemia Father   . Asthma Sister   . Diabetes Maternal Aunt   . Cancer Maternal Grandmother      Prior to Admission medications   Medication Sig Start Date End Date Taking? Authorizing Provider  albuterol (PROVENTIL HFA;VENTOLIN HFA) 108 (90 Base) MCG/ACT inhaler Inhale 2 puffs into the lungs every 4 (four) hours as needed for wheezing or shortness of breath. 09/22/15  Yes Langeland, Dawn T, MD  albuterol (PROVENTIL) (2.5 MG/3ML) 0.083% nebulizer solution Take 3 mLs (2.5 mg total) by nebulization every 4 (four) hours as needed for wheezing or shortness of breath. 09/22/15  Yes Langeland, Dawn T, MD  ibuprofen (ADVIL,MOTRIN) 200 MG tablet Take 200-400 mg by mouth every 6 (six) hours as needed for moderate pain.   Yes [provider]  levETIRAcetam (KEPPRA) 500 MG tablet Take 1 tablet (500 mg total) by mouth 2 (two) times daily. 09/22/15  Yes Langeland, Dawn T, MD  ondansetron (ZOFRAN ODT) 4 MG disintegrating tablet Take 1 tablet (4 mg total) by mouth every 8 (eight) hours as needed for nausea or vomiting. Patient not taking: Reported on 11/12/2016 07/27/16   Joy, Raquel Sarna C, PA-C    oxyCODONE (ROXICODONE) 5 MG immediate release tablet Take 1 tablet (5 mg total) by mouth every 4 (four) hours as needed for severe pain. Patient not taking: Reported on 11/12/2016 06/23/16   Nona Dell, PA-C  pantoprazole (PROTONIX) 20 MG tablet Take 1 tablet (20 mg total) by mouth daily. Patient not taking: Reported on 11/12/2016 07/27/16   Lorayne Bender, PA-C   Physical Exam: Vitals:   11/12/16 0600 11/12/16 0615 11/12/16 0630 11/12/16 0645  BP: 132/88     Pulse: 71 85 (!) 129 80  Resp: 19 (!) 27 (!) 24 (!) 22  Temp:      TempSrc:      SpO2: 100% 100% 100% 100%  Weight:      Height:        Wt Readings from Last 3 Encounters:  11/12/16 68.5 kg (151 lb)  07/27/16 71.8 kg (158 lb  4 oz)  04/19/16 68 kg (150 lb)    General:  Appears calm and comfortable; A&OX3 Eyes:  PERRL, EOMI, normal lids, iris ENT:  grossly normal hearing, lips & tongue Neck:  no LAD, masses or thyromegaly Cardiovascular:  RRR, no m/r/g. No LE edema.  Respiratory:  CTA bilaterally, no w/r/r. Normal respiratory effort. Abdomen:  soft, distended, TTP diffuse but R>L, no rigidity or rebound Skin:  no rash or induration seen on limited exam Musculoskeletal:  grossly normal tone BUE/BLE Psychiatric:  grossly normal mood and affect, speech fluent and appropriate Neurologic:  CN 2-12 grossly intact, moves all extremities in coordinated fashion.          Labs on Admission:  Basic Metabolic Panel:  Recent Labs Lab 11/12/16 0456  NA 138  K 2.8*  CL 107  CO2 21*  GLUCOSE 122*  BUN 6  CREATININE 0.79  CALCIUM 8.5*  MG 1.8   Liver Function Tests:  Recent Labs Lab 11/12/16 0456  AST 27  ALT 17  ALKPHOS 36*  BILITOT 0.5  PROT 6.2*  ALBUMIN 3.7    Recent Labs Lab 11/12/16 0456  LIPASE 15   No results for input(s): AMMONIA in the last 168 hours. CBC:  Recent Labs Lab 11/12/16 0456  WBC 11.5*  HGB 14.0  HCT 40.8  MCV 88.9  PLT 226   Cardiac Enzymes: No results for  input(s): CKTOTAL, CKMB, CKMBINDEX, TROPONINI in the last 168 hours.  BNP (last 3 results) No results for input(s): BNP in the last 8760 hours.  ProBNP (last 3 results) No results for input(s): PROBNP in the last 8760 hours.   Serum creatinine: 0.79 mg/dL 11/12/16 0456 Estimated creatinine clearance: 104.5 mL/min  CBG: No results for input(s): GLUCAP in the last 168 hours.  Radiological Exams on Admission: Ct Abdomen Pelvis Wo Contrast  Result Date: 11/12/2016 CLINICAL DATA:  Right upper quadrant abdominal pain and nausea. EXAM: CT ABDOMEN AND PELVIS WITHOUT CONTRAST TECHNIQUE: Multidetector CT imaging of the abdomen and pelvis was performed following the standard protocol without IV contrast. COMPARISON:  Right upper quadrant ultrasound from same day. CT abdomen pelvis dated April 21, 2015. FINDINGS: Lower chest: No acute abnormality. Hepatobiliary: No focal liver abnormality is seen. Status post cholecystectomy. No biliary dilatation. Pancreas: Unremarkable. No pancreatic ductal dilatation or surrounding inflammatory changes. Spleen: Normal in size without focal abnormality. Adrenals/Urinary Tract: Adrenal glands are unremarkable. Kidneys are normal, without renal calculi, focal lesion, or hydronephrosis. Bladder is unremarkable. Stomach/Bowel: Mild wall thickening of the ascending and proximal transverse colon. The stomach is within normal limits. The appendix is surgically absent. No bowel obstruction. Vascular/Lymphatic: No significant vascular findings are present. No enlarged abdominal or pelvic lymph nodes. Reproductive: Uterus and bilateral adnexa are unremarkable. Partially visualized 10 mm low-density lesion along the left perineum, unchanged. Tampon in the vagina. Other: No free fluid or pneumoperitoneum. Musculoskeletal: No acute or significant osseous findings. IMPRESSION: 1. Mild wall thickening of the ascending and proximal transverse colon, consistent with colitis. No bowel  obstruction. 2. Unchanged 1.0 cm left Bartholin's gland cyst. Electronically Signed   By: Titus Dubin M.D.   On: 11/12/2016 09:38   US Abdomen Limited Ruq  Result Date: 11/12/2016 CLINICAL DATA:  RIGHT upper quadrant pain. History of pancreatitis. Status post cholecystectomy. EXAM: ULTRASOUND ABDOMEN LIMITED RIGHT UPPER QUADRANT COMPARISON:  Abdominal ultrasound July 27, 2016 FINDINGS: Gallbladder: Status post cholecystectomy. No fluid collections within the gallbladder fossa. Common bile duct: Diameter: 4 mm.  No choledocholithiasis. Liver: No  focal lesion identified. Within normal limits in parenchymal echogenicity. Portal vein is patent on color Doppler imaging with normal direction of blood flow towards the liver. IMPRESSION: No acute RIGHT upper quadrant process.  Status post cholecystectomy. Electronically Signed   By: Elon Alas M.D.   On: 11/12/2016 06:41    EKG: Independently reviewed.  Normal sinus rhythm.  No STEMI.  Assessment/Plan Principal Problem:   Colitis Active Problems:   Asthma   Low blood potassium   Seizure (HCC)  Colitis Continue ciprofloxacin and Flagyl Lipase negative Continue IV fluids Appearing Zofran for nausea  Low K Replaced IV Recheck in AM Will monitor QT, check EKG in AM  Asthma Prn albuterol  Seizures hx Prn ativan Seizure precautions IV Keppra  GERD Cont PPI  Code Status: FC DVT Prophylaxis: lovenox Family Communication: mother on phone during interview Disposition Plan: Pending Improvement  Status: obs tele  Elwin Mocha, MD Family Medicine [facility] [email or web address] Password [email or web address]

## 2016-11-12 NOTE — ED Triage Notes (Signed)
Patient complaining of abdominal and nausea x 1 week. Patient feels like heart is palpitating and has been going on for about 3 days. Patient went to give plasma on yesterday and could not give it due to her heart rate being in 120's. Patient abdominal pain in upper right quadrant. Patient states the pain is going into her shoulder. Patient has a hx of pancreatitis.

## 2016-11-12 NOTE — ED Notes (Signed)
Report given to FLOOR RN 

## 2016-11-12 NOTE — ED Provider Notes (Signed)
TIME SEEN: 4:57 AM  CHIEF COMPLAINT: Abdominal pain, palpitations  HPI: Patient is a 26 year old female with history of seizures, gallstones, pancreatitis, DVT and pregnancy, asthma who presents to the emergency department with 1 week of right upper quadrant abdominal pain that has been getting worse.  Also had a low-grade fever of 100.3 at home and nausea but no vomiting.  No diarrhea.  Reports over the past several days has had intermittent palpitations.  Palpitations have currently resolved.  Reports her heart rate was in the 120s today.  No chest pain or shortness of breath.  Palpitations worse with standing upright.  Patient has previously had an appendectomy and 2 hernia repairs.  She states she has had "gallstones removed" after she had what sounds like choledocholithiasis.  She still has her gallbladder.  Patient denies dysuria, hematuria, vaginal bleeding or discharge.  Last menstrual period was October 25.  ROS: See HPI Constitutional: fever  Eyes: no drainage  ENT: no runny nose   Cardiovascular:  no chest pain  Resp: no SOB  GI: no vomiting GU: no dysuria Integumentary: no rash  Allergy: no hives  Musculoskeletal: no leg swelling  Neurological: no slurred speech ROS otherwise negative  PAST MEDICAL HISTORY/PAST SURGICAL HISTORY:  Past Medical History:  Diagnosis Date  . Asthma   . Complication of anesthesia    "I wake up during anesthesia" (10/14/2015)  . DVT (deep vein thrombosis) in pregnancy ([place])   . Epilepsy ([place])   . GERD (gastroesophageal reflux disease)   . Heart murmur    last work up age 59- no symtoms  . Pancreatitis   . Seizures ([place])    epilepsy  . Sickle cell trait ([place])   . Sleep apnea    "used to wear CPAP; don't have one here in [place] since I moved in 2014" (10/14/2015)    MEDICATIONS:  Prior to Admission medications   Medication Sig Start Date End Date Taking? Authorizing Provider  albuterol (PROVENTIL HFA;VENTOLIN HFA) 108 (90 Base) MCG/ACT inhaler  Inhale 2 puffs into the lungs every 4 (four) hours as needed for wheezing or shortness of breath. 09/22/15   Langeland, Leda Quail, MD  albuterol (PROVENTIL) (2.5 MG/3ML) 0.083% nebulizer solution Take 3 mLs (2.5 mg total) by nebulization every 4 (four) hours as needed for wheezing or shortness of breath. 09/22/15   Maren Reamer, MD  famotidine (PEPCID) 20 MG tablet Take 1 tablet (20 mg total) by mouth 2 (two) times daily. 07/27/16 08/01/16  Joy, Shawn C, PA-C  ibuprofen (ADVIL,MOTRIN) 200 MG tablet Take 200-400 mg by mouth every 6 (six) hours as needed for moderate pain.    [provider]  Ibuprofen-Diphenhydramine Cit (ADVIL PM) 200-38 MG TABS Take 2 tablets by mouth at bedtime as needed (for sleep/pain).     [provider]  levETIRAcetam (KEPPRA) 500 MG tablet Take 1 tablet (500 mg total) by mouth 2 (two) times daily. 09/22/15   Maren Reamer, MD  ondansetron (ZOFRAN ODT) 4 MG disintegrating tablet Take 1 tablet (4 mg total) by mouth every 8 (eight) hours as needed for nausea or vomiting. 07/27/16   Joy, Shawn C, PA-C  oxyCODONE (ROXICODONE) 5 MG immediate release tablet Take 1 tablet (5 mg total) by mouth every 4 (four) hours as needed for severe pain. 06/23/16   Nona Dell, PA-C  pantoprazole (PROTONIX) 20 MG tablet Take 1 tablet (20 mg total) by mouth daily. 07/27/16   Joy, Helane Gunther, PA-C    ALLERGIES:  Allergies  Allergen Reactions  . Cheese Shortness Of Breath  . Chocolate Shortness Of Breath  . Ibuprofen Hives and Shortness Of Breath    Children's ibuprofen  . Ivp Dye [Iodinated Diagnostic Agents] Shortness Of Breath  . Latex Anaphylaxis, Swelling and Other (See Comments)    Reaction:  Localized swelling   . Orange Juice [Orange Oil] Shortness Of Breath  . Other Hives and Other (See Comments)    Pt states that she is allergic to all steroids except IV solu-medrol.    Marland Kitchen Peach [Prunus Persica] Anaphylaxis  . Peanuts [Peanut Oil] Anaphylaxis  . Pear  Anaphylaxis  . Prednisone Hives  . Raspberry Anaphylaxis  . Tylenol [Acetaminophen] Hives, Shortness Of Breath and Other (See Comments)    Pt states that this is only with the liquid form.    . Amoxicillin Hives and Other (See Comments)    Has patient had a PCN reaction causing immediate rash, facial/tongue/throat swelling, SOB or lightheadedness with hypotension: No Has patient had a PCN reaction causing severe rash involving mucus membranes or skin necrosis: No Has patient had a PCN reaction that required hospitalization: No Has patient had a PCN reaction occurring within the last 10 years: No If all of the above answers are "NO", then may proceed with Cephalosporin use.  Marland Kitchen Apricot Flavor Hives  . Doxycycline Hives  . Erythromycin Hives  . Milk Of Magnesia [Magnesium Hydroxide] Hives and Itching  . Penicillins Hives and Other (See Comments)    Has patient had a PCN reaction causing immediate rash, facial/tongue/throat swelling, SOB or lightheadedness with hypotension: No Has patient had a PCN reaction causing severe rash involving mucus membranes or skin necrosis: No Has patient had a PCN reaction that required hospitalization: No Has patient had a PCN reaction occurring within the last 10 years: No If all of the above answers are "NO", then may proceed with Cephalosporin use.    SOCIAL HISTORY:  Social History  Substance Use Topics  . Smoking status: Former Smoker    Packs/day: 0.25    Years: 4.00    Types: Cigarettes    Quit date: 10/02/2015  . Smokeless tobacco: Never Used  . Alcohol use 0.0 oz/week     Comment: 10/14/2015 "might have a few drinks/year; big parties, holidays"    FAMILY HISTORY: Family History  Problem Relation Age of Onset  . Cancer Mother        cervical cancer  . Asthma Mother   . Hypertension Father   . Sickle cell anemia Father   . Asthma Sister   . Diabetes Maternal Aunt   . Cancer Maternal Grandmother     EXAM: BP (!) 130/99 (BP Location:  Left Arm)   Pulse 93   Temp 99.3 F (37.4 C) (Oral)   Resp 20   Ht 5\' 5"  (1.651 m)   Wt 68.5 kg (151 lb)   LMP 11/08/2016   SpO2 100%   BMI 25.13 kg/m  CONSTITUTIONAL: Alert and oriented and responds appropriately to questions. Well-appearing; well-nourished HEAD: Normocephalic EYES: Conjunctivae clear, pupils appear equal, EOMI ENT: normal nose; moist mucous membranes NECK: Supple, no meningismus, no nuchal rigidity, no LAD  CARD: RRR; S1 and S2 appreciated; no murmurs, no clicks, no rubs, no gallops RESP: Normal chest excursion without splinting or tachypnea; breath sounds clear and equal bilaterally; no wheezes, no rhonchi, no rales, no hypoxia or respiratory distress, speaking full sentences ABD/GI: Normal bowel sounds; non-distended; soft, tender in the right upper quadrant with voluntary  guarding, no rebound, positive Murphy sign, no peritoneal signs, no hepatosplenomegaly BACK:  The back appears normal and is non-tender to palpation, there is no CVA tenderness EXT: Normal ROM in all joints; non-tender to palpation; no edema; normal capillary refill; no cyanosis, no calf tenderness or swelling    SKIN: Normal color for age and race; warm; no rash NEURO: Moves all extremities equally PSYCH: The patient's mood and manner are appropriate. Grooming and personal hygiene are appropriate.  MEDICAL DECISION MAKING: Patient here with right upper quadrant abdominal pain.  Reports low-grade fever at home of 100.3 and nausea.  Concern for possible cholecystitis.  Differential also includes pancreatitis, biliary colic, gastritis.  Will obtain labs, urine.  Patient also having intermittent palpitations.  None currently.  Heart rate is normal with normal EKG.  No chest pain or shortness of breath.  Doubt ACS, PE or dissection.  Will give pain nausea medication and IV fluids.  ED PROGRESS: Patient's labs unremarkable other than mild leukocytosis of 11.5 and a potassium of 2.8.  Will give IV  potassium replacement and oral replacement.  Lipase is normal.  LFTs normal.  Pregnancy test negative.  Right upper quadrant ultrasound pending.   Ultrasound shows no acute abnormality.  Patient is actually status post cholecystectomy although she initially denied this.  Confirmed this in her chart.  Still having diffuse upper abdominal tenderness.  Could be gastritis.  Given she is so significantly tender, will obtain a CT of abdomen pelvis.  Will give IV Protonix.  She is allergic to components of the GI cocktail.  CT will be without contrast because of her allergy to IV dye.  Signed out to Dr. Darl Householder to follow-up on patient CT imaging.   I reviewed all nursing notes, vitals, pertinent previous records, EKGs, lab and urine results, imaging (as available).     EKG Interpretation  Date/Time:  Monday November 12 2016 05:47:50 EDT Ventricular Rate:  70 PR Interval:    QRS Duration: 77 QT Interval:  419 QTC Calculation: 453 R Axis:   50 Text Interpretation:  Sinus rhythm No significant change since last tracing Confirmed by Nolin Grell, Cyril Mourning [phone]) on 11/12/2016 5:52:16 AM         Vestal Markin, Delice Bison, DO 11/12/16 2951

## 2016-11-12 NOTE — Progress Notes (Signed)
Report received ED RN whom reports KCL runs were not given at original time due to antibiotic infusion still infusing and 1 IV site. Will reschedule. Patient is alert oriented x4, ambulatory. 1 assist for safety

## 2016-11-13 ENCOUNTER — Encounter (HOSPITAL_COMMUNITY): Payer: Self-pay | Admitting: Gastroenterology

## 2016-11-13 DIAGNOSIS — K529 Noninfective gastroenteritis and colitis, unspecified: Secondary | ICD-10-CM | POA: Diagnosis present

## 2016-11-13 DIAGNOSIS — K219 Gastro-esophageal reflux disease without esophagitis: Secondary | ICD-10-CM | POA: Diagnosis present

## 2016-11-13 DIAGNOSIS — Z880 Allergy status to penicillin: Secondary | ICD-10-CM | POA: Diagnosis not present

## 2016-11-13 DIAGNOSIS — R569 Unspecified convulsions: Secondary | ICD-10-CM | POA: Diagnosis not present

## 2016-11-13 DIAGNOSIS — Z832 Family history of diseases of the blood and blood-forming organs and certain disorders involving the immune mechanism: Secondary | ICD-10-CM | POA: Diagnosis not present

## 2016-11-13 DIAGNOSIS — D573 Sickle-cell trait: Secondary | ICD-10-CM | POA: Diagnosis present

## 2016-11-13 DIAGNOSIS — Z881 Allergy status to other antibiotic agents status: Secondary | ICD-10-CM | POA: Diagnosis not present

## 2016-11-13 DIAGNOSIS — R1011 Right upper quadrant pain: Secondary | ICD-10-CM | POA: Diagnosis not present

## 2016-11-13 DIAGNOSIS — Z886 Allergy status to analgesic agent status: Secondary | ICD-10-CM | POA: Diagnosis not present

## 2016-11-13 DIAGNOSIS — R112 Nausea with vomiting, unspecified: Secondary | ICD-10-CM | POA: Diagnosis not present

## 2016-11-13 DIAGNOSIS — Z91010 Allergy to peanuts: Secondary | ICD-10-CM | POA: Diagnosis not present

## 2016-11-13 DIAGNOSIS — Z91041 Radiographic dye allergy status: Secondary | ICD-10-CM | POA: Diagnosis not present

## 2016-11-13 DIAGNOSIS — G40909 Epilepsy, unspecified, not intractable, without status epilepticus: Secondary | ICD-10-CM | POA: Diagnosis present

## 2016-11-13 DIAGNOSIS — Z91040 Latex allergy status: Secondary | ICD-10-CM | POA: Diagnosis not present

## 2016-11-13 DIAGNOSIS — Z79899 Other long term (current) drug therapy: Secondary | ICD-10-CM | POA: Diagnosis not present

## 2016-11-13 DIAGNOSIS — G4730 Sleep apnea, unspecified: Secondary | ICD-10-CM | POA: Diagnosis present

## 2016-11-13 DIAGNOSIS — G8929 Other chronic pain: Secondary | ICD-10-CM | POA: Diagnosis present

## 2016-11-13 DIAGNOSIS — E876 Hypokalemia: Secondary | ICD-10-CM | POA: Diagnosis not present

## 2016-11-13 DIAGNOSIS — Z8249 Family history of ischemic heart disease and other diseases of the circulatory system: Secondary | ICD-10-CM | POA: Diagnosis not present

## 2016-11-13 DIAGNOSIS — Z91018 Allergy to other foods: Secondary | ICD-10-CM | POA: Diagnosis not present

## 2016-11-13 DIAGNOSIS — Z87891 Personal history of nicotine dependence: Secondary | ICD-10-CM | POA: Diagnosis not present

## 2016-11-13 DIAGNOSIS — Z9049 Acquired absence of other specified parts of digestive tract: Secondary | ICD-10-CM | POA: Diagnosis not present

## 2016-11-13 DIAGNOSIS — F1290 Cannabis use, unspecified, uncomplicated: Secondary | ICD-10-CM | POA: Diagnosis present

## 2016-11-13 DIAGNOSIS — J4520 Mild intermittent asthma, uncomplicated: Secondary | ICD-10-CM | POA: Diagnosis not present

## 2016-11-13 DIAGNOSIS — Z86718 Personal history of other venous thrombosis and embolism: Secondary | ICD-10-CM | POA: Diagnosis not present

## 2016-11-13 LAB — CBC
HEMATOCRIT: 33.4 % — AB (ref 36.0–46.0)
Hemoglobin: 11.4 g/dL — ABNORMAL LOW (ref 12.0–15.0)
MCH: 30.2 pg (ref 26.0–34.0)
MCHC: 34.1 g/dL (ref 30.0–36.0)
MCV: 88.6 fL (ref 78.0–100.0)
PLATELETS: 212 10*3/uL (ref 150–400)
RBC: 3.77 MIL/uL — AB (ref 3.87–5.11)
RDW: 14.2 % (ref 11.5–15.5)
WBC: 6.5 10*3/uL (ref 4.0–10.5)

## 2016-11-13 LAB — BASIC METABOLIC PANEL
ANION GAP: 8 (ref 5–15)
CHLORIDE: 111 mmol/L (ref 101–111)
CO2: 21 mmol/L — AB (ref 22–32)
Calcium: 8 mg/dL — ABNORMAL LOW (ref 8.9–10.3)
Creatinine, Ser: 0.59 mg/dL (ref 0.44–1.00)
Glucose, Bld: 86 mg/dL (ref 65–99)
Potassium: 3.3 mmol/L — ABNORMAL LOW (ref 3.5–5.1)
Sodium: 140 mmol/L (ref 135–145)

## 2016-11-13 LAB — HIV ANTIBODY (ROUTINE TESTING W REFLEX): HIV Screen 4th Generation wRfx: NONREACTIVE

## 2016-11-13 MED ORDER — PROMETHAZINE HCL 25 MG/ML IJ SOLN
12.5000 mg | Freq: Once | INTRAMUSCULAR | Status: AC
  Administered 2016-11-13: 12.5 mg via INTRAVENOUS
  Filled 2016-11-13: qty 1

## 2016-11-13 MED ORDER — POTASSIUM CHLORIDE 10 MEQ/100ML IV SOLN
10.0000 meq | INTRAVENOUS | Status: AC
  Administered 2016-11-13 (×2): 10 meq via INTRAVENOUS
  Filled 2016-11-13 (×2): qty 100

## 2016-11-13 MED ORDER — SODIUM PHOSPHATES 45 MMOLE/15ML IV SOLN
30.0000 mmol | Freq: Once | INTRAVENOUS | Status: AC
  Administered 2016-11-13: 30 mmol via INTRAVENOUS
  Filled 2016-11-13: qty 10

## 2016-11-13 MED ORDER — PANCRELIPASE (LIP-PROT-AMYL) 12000-38000 UNITS PO CPEP: 36000.0000 [IU] | ORAL_CAPSULE | Freq: Three times a day (TID) | ORAL | Status: DC

## 2016-11-13 MED ORDER — LORAZEPAM 1 MG PO TABS
1.0000 mg | ORAL_TABLET | Freq: Three times a day (TID) | ORAL | Status: DC | PRN
  Administered 2016-11-15 – 2016-11-16 (×2): 1 mg via ORAL
  Filled 2016-11-13 (×2): qty 1

## 2016-11-13 MED ORDER — PROMETHAZINE HCL 25 MG/ML IJ SOLN
12.5000 mg | Freq: Four times a day (QID) | INTRAMUSCULAR | Status: DC | PRN
  Administered 2016-11-13 – 2016-11-16 (×6): 12.5 mg via INTRAVENOUS
  Filled 2016-11-13 (×8): qty 1

## 2016-11-13 NOTE — Progress Notes (Signed)
Confirmed w/admitting-Faye that patient's Medicaid [place] is active.

## 2016-11-13 NOTE — Progress Notes (Signed)
PROGRESS NOTE    Jasmine [name]  OJJ:[id number] DOB: Jan 16, 1991 DOA: 11/12/2016 PCP: Patient, No Pcp Per   Brief Narrative: Lauryl Seyer is a 26 y.o. 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 She was found to have colitis of the ascending and proximal transverse colon.   Assessment & Plan:   Principal Problem:   Colitis Active Problems:   Asthma   Low blood potassium   Seizure (HCC)   Colitis of the ascending and proximal transverse colon:  Admitted for IV antibiotics with cipro and flagyl, IV antibiotics, IV pain control and IV anti emetics.  Pt reports abdominal symptoms of nausea, vomiting since many years and she would benefit from a GI consult for further evaluation, esp rule out crohn's disease.  Pain control. Hydrate.    Asthma: no wheezing heard ,    Hypokalemia: replaced.   H/o seizures:  On IV keppra.    GERD  Resume PPI.    Hypophosphatemia:  Replaced.   Mild normocytic anemia.    DVT prophylaxis: lovenox.  Code Status: full code.  Family Communication: none at bedside.  Disposition Plan: pending GI evaluation and on resolution fot he symptoms.    Consultants:   Gastroenterology.    Procedures: none.   Antimicrobials: ciprofloxacin. And flagyl since admission.   Subjective: Teary and emotional.   Objective: Vitals:   11/12/16 2245 11/13/16 0506 11/13/16 0630 11/13/16 1020  BP: 124/64 126/61  114/86  Pulse: 90 81  95  Resp: 18 18    Temp: 99.2 F (37.3 C) 98.8 F (37.1 C)  98.9 F (37.2 C)  TempSrc: Oral Oral  Oral  SpO2: 100% 100%  100%  Weight:   70.4 kg (155 lb 3.3 oz)   Height:        Intake/Output Summary (Last 24 hours) at 11/13/16 1339 Last data filed at 11/13/16 0619  Gross per 24 hour  Intake             2210 ml  Output                40 ml  Net             2170 ml   Filed Weights   11/12/16 0430 11/13/16 0630  Weight: 68.5 kg (151 lb) 70.4 kg (155 lb 3.3 oz)    Examination:  General exam: anxious and crying.  Respiratory system: Clear to auscultation. Respiratory effort normal. Cardiovascular system: S1 & S2 heard, RRR. No JVD, murmurs, rubs, gallops or clicks. No pedal edema. Gastrointestinal system: Abdomen is soft tender generalized,  non distended bowel sounds heard.  Central nervous system: Alert and oriented. No focal neurological deficits. Extremities: Symmetric 5 x 5 power. Skin: No rashes, lesions or ulcers Psychiatry: teary, crying. .     Data Reviewed: I have personally reviewed following labs and imaging studies  CBC:  Recent Labs Lab 11/12/16 0456 11/13/16 0828  WBC 11.5* 6.5  HGB 14.0 11.4*  HCT 40.8 33.4*  MCV 88.9 88.6  PLT 226 937   Basic Metabolic Panel:  Recent Labs Lab 11/12/16 0456 11/12/16 1454 11/13/16 0828  NA 138 140 140  K 2.8* 4.0 3.3*  CL 107 115* 111  CO2 21* 16* 21*  GLUCOSE 122* 101* 86  BUN 6 <5* <5*  CREATININE 0.79 0.56 0.59  CALCIUM 8.5* 7.8* 8.0*  MG 1.8  --   --   PHOS  --  2.2*  --    GFR: Estimated Creatinine Clearance: 105.9 mL/min (by C-G formula based on SCr of 0.59 mg/dL). Liver Function Tests:  Recent Labs Lab 11/12/16 0456  AST 27  ALT 17  ALKPHOS 36*  BILITOT 0.5  PROT 6.2*  ALBUMIN 3.7    Recent Labs Lab 11/12/16 0456  LIPASE 15   No results for input(s): AMMONIA in the last 168 hours. Coagulation Profile: No results for input(s): INR, PROTIME in the last 168 hours. Cardiac Enzymes: No results for input(s): CKTOTAL, CKMB, CKMBINDEX, TROPONINI in the last 168 hours. BNP (last 3 results) No results for input(s): PROBNP in the last 8760 hours. HbA1C: No results for input(s): HGBA1C in the last 72 hours. CBG: No results for input(s): GLUCAP in the last 168 hours. Lipid Profile: No results for input(s): CHOL, HDL,  LDLCALC, TRIG, CHOLHDL, LDLDIRECT in the last 72 hours. Thyroid Function Tests: No results for input(s): TSH, T4TOTAL, FREET4, T3FREE, THYROIDAB in the last 72 hours. Anemia Panel: No results for input(s): VITAMINB12, FOLATE, FERRITIN, TIBC, IRON, RETICCTPCT in the last 72 hours. Sepsis Labs:  Recent Labs Lab 11/12/16 1042  LATICACIDVEN 1.24    No results found for this or any previous visit (from the past 240 hour(s)).       Radiology Studies: Ct Abdomen Pelvis Wo Contrast  Result Date: 11/12/2016 CLINICAL DATA:  Right upper quadrant abdominal pain and nausea. EXAM: CT ABDOMEN AND PELVIS WITHOUT CONTRAST TECHNIQUE: Multidetector CT imaging of the abdomen and pelvis was performed following the standard protocol without IV contrast. COMPARISON:  Right upper quadrant ultrasound from same day. CT abdomen pelvis dated April 21, 2015. FINDINGS: Lower chest: No acute abnormality. Hepatobiliary: No focal liver abnormality is seen. Status post cholecystectomy. No biliary dilatation. Pancreas: Unremarkable. No pancreatic ductal dilatation or surrounding inflammatory changes. Spleen: Normal in size without focal abnormality. Adrenals/Urinary Tract: Adrenal glands are unremarkable. Kidneys are normal, without renal calculi, focal lesion, or hydronephrosis. Bladder is unremarkable. Stomach/Bowel: Mild wall thickening of the ascending and proximal transverse colon. The stomach is within normal limits. The appendix is surgically absent. No bowel obstruction. Vascular/Lymphatic: No significant vascular findings are present. No enlarged abdominal or pelvic lymph nodes. Reproductive: Uterus and bilateral adnexa are unremarkable. Partially visualized 10 mm low-density lesion along the left perineum, unchanged. Tampon in the vagina. Other: No free fluid or pneumoperitoneum. Musculoskeletal: No acute or significant osseous findings. IMPRESSION: 1. Mild wall thickening of the ascending and proximal transverse  colon, consistent with colitis. No bowel obstruction. 2. Unchanged 1.0 cm left Bartholin's gland cyst. Electronically Signed   By: Titus Dubin M.D.   On: 11/12/2016 09:38   US Abdomen Limited Ruq  Result Date: 11/12/2016 CLINICAL DATA:  RIGHT upper quadrant pain. History of pancreatitis. Status post cholecystectomy. EXAM: ULTRASOUND ABDOMEN LIMITED RIGHT UPPER QUADRANT COMPARISON:  Abdominal ultrasound July 27, 2016 FINDINGS: Gallbladder: Status post cholecystectomy. No fluid collections within the gallbladder fossa. Common bile duct: Diameter: 4 mm.  No choledocholithiasis. Liver: No focal lesion identified. Within normal limits in parenchymal echogenicity. Portal vein is patent on color Doppler imaging with normal direction of blood flow towards the liver. IMPRESSION: No acute RIGHT upper quadrant process.  Status post cholecystectomy. Electronically Signed   By: Elon Alas M.D.   On: 11/12/2016 06:41        Scheduled Meds: . enoxaparin (LOVENOX) injection  40 mg Subcutaneous Q24H  . pantoprazole (PROTONIX) IV  40 mg Intravenous Q24H  . sodium chloride flush  3 mL Intravenous Q12H   Continuous Infusions: . ciprofloxacin Stopped (11/13/16 0029)  . levETIRAcetam Stopped (11/13/16 0828)  . metronidazole 500 mg (11/13/16 1251)  . potassium chloride 10 mEq (11/13/16 1250)  . sodium phosphate  Dextrose 5% IVPB 30 mmol (11/13/16 1250)     LOS: 0 days    Time spent: 35 MINUTES.     Hosie Poisson, MD [facility] Pager [phone]   If 7PM-7AM, please contact night-coverage [email or web address] Password [email or web address] 11/13/2016, 1:39 PM

## 2016-11-13 NOTE — Care Management Note (Signed)
Case Management Note  Patient Details  Name: Lizzete Gough MRN: [id number] Date of Birth: March 02, 1990  Subjective/Objective: 26 y/o f admitted w/colitis. From home. Confirmed w/billing-patient has medicaid -she has a pcp(has a case worker through DSS-informed patient to contact case worker)provided patient w/tel# for DSS.                   Action/Plan:d/c plan home.   Expected Discharge Date:   (unknown)               Expected Discharge Plan:  Home/Self Care  In-House Referral:     Discharge planning Services  CM Consult  Post Acute Care Choice:    Choice offered to:     DME Arranged:    DME Agency:     HH Arranged:    HH Agency:     Status of Service:  In process, will continue to follow  If discussed at Long Length of Stay Meetings, dates discussed:    Additional Comments:  Dessa Phi, RN 11/13/2016, 1:56 PM

## 2016-11-13 NOTE — Consult Note (Signed)
Reason for Consult: Chronic abdominal pain abnormal CAT scan  Referring Physician: Hospital team  Jasmine [name] is an 26 y.o. female.  HPI: Patient seen and examined and computer chart reviewed patient has a complicated [place] and a chronic history of pancreatitis although she denies any previous colonoscopy or endoscopy and her family history is negative for any GI issues and no Crohn's disease in the family and she's had no lower bowel complaints and has not seen any blood but she has lost some weight lately and she cannot say what sets off these attacks and she does not remember ever being on pancreatic enzymes and does have a gastroenterologist in [place] but not here but says she's had the same complaints since childhood and rarely drinks alcohol and does not have any insight on what helps her get better  Past Medical History:  Diagnosis Date  . Asthma   . Complication of anesthesia    "I wake up during anesthesia" (10/14/2015)  . DVT (deep vein thrombosis) in pregnancy ([place])   . Epilepsy ([place])   . GERD (gastroesophageal reflux disease)   . Heart murmur    last work up age 71- no symtoms  . Pancreatitis   . Seizures ([place])    epilepsy  . Sickle cell trait ([place])   . Sleep apnea    "used to wear CPAP; don't have one here in [place] since I moved in 2014" (10/14/2015)    Past Surgical History:  Procedure Laterality Date  . APPENDECTOMY    . CESAREAN SECTION  2013  . CESAREAN SECTION N/A 11/08/2014   Procedure: CESAREAN SECTION;  Surgeon: Truett Mainland, DO;  Location: [facility];  Service: Obstetrics;  Laterality: N/A;  . CHOLECYSTECTOMY N/A 10/16/2015   Procedure: LAPAROSCOPIC CHOLECYSTECTOMY WITH POSSIBLE INTRAOPERATIVE CHOLANGIOGRAM;  Surgeon: Donnie Mesa, MD;  Location: [facility];  Service: General;  Laterality: N/A;  . INGUINAL HERNIA REPAIR Bilateral ~ 1996  . NERVE, TENDON AND ARTERY REPAIR Right 09/23/2012   Procedure: I&D and Repair As  Necessary/Right Hand and Palm;  Surgeon: Roseanne Kaufman, MD;  Location: [facility];  Service: Orthopedics;  Laterality: Right;  . TONSILLECTOMY      Family History  Problem Relation Age of Onset  . Cancer Mother        cervical cancer  . Asthma Mother   . Hypertension Father   . Sickle cell anemia Father   . Asthma Sister   . Diabetes Maternal Aunt   . Cancer Maternal Grandmother     Social History:  reports that she quit smoking about 13 months ago. Her smoking use included Cigarettes. She has a 1.00 pack-year smoking history. She has never used smokeless tobacco. She reports that she drinks alcohol. She reports that she does not use drugs.  Allergies:  Allergies  Allergen Reactions  . Cheese Shortness Of Breath  . Chocolate Shortness Of Breath  . Ibuprofen Hives and Shortness Of Breath    Children's ibuprofen  . Ivp Dye [Iodinated Diagnostic Agents] Shortness Of Breath  . Latex Anaphylaxis, Swelling and Other (See Comments)    Reaction:  Localized swelling   . Orange Juice [Orange Oil] Shortness Of Breath  . Other Hives and Other (See Comments)    Pt states that she is allergic to all steroids except IV solu-medrol.    Marland Kitchen Peach [Prunus Persica] Anaphylaxis  . Peanuts [Peanut Oil] Anaphylaxis  . Pear Anaphylaxis  . Prednisone Hives  . Raspberry Anaphylaxis  .  Tylenol [Acetaminophen] Hives, Shortness Of Breath and Other (See Comments)    Pt states that this is only with the liquid form.    . Amoxicillin Hives and Other (See Comments)    Has patient had a PCN reaction causing immediate rash, facial/tongue/throat swelling, SOB or lightheadedness with hypotension: No Has patient had a PCN reaction causing severe rash involving mucus membranes or skin necrosis: No Has patient had a PCN reaction that required hospitalization: No Has patient had a PCN reaction occurring within the last 10 years: No If all of the above answers are "NO", then may proceed with Cephalosporin use.  Marland Kitchen  Apricot Flavor Hives  . Doxycycline Hives  . Erythromycin Hives  . Milk Of Magnesia [Magnesium Hydroxide] Hives and Itching  . Penicillins Hives and Other (See Comments)    Has patient had a PCN reaction causing immediate rash, facial/tongue/throat swelling, SOB or lightheadedness with hypotension: No Has patient had a PCN reaction causing severe rash involving mucus membranes or skin necrosis: No Has patient had a PCN reaction that required hospitalization: No Has patient had a PCN reaction occurring within the last 10 years: No If all of the above answers are "NO", then may proceed with Cephalosporin use.    Medications: I have reviewed the patient's current medications.  Results for orders placed or performed during the hospital encounter of 11/12/16 (from the past 48 hour(s))  Lipase, blood     Status: None   Collection Time: 11/12/16  4:56 AM  Result Value Ref Range   Lipase 15 11 - 51 U/L  Comprehensive metabolic panel     Status: Abnormal   Collection Time: 11/12/16  4:56 AM  Result Value Ref Range   Sodium 138 135 - 145 mmol/L   Potassium 2.8 (L) 3.5 - 5.1 mmol/L   Chloride 107 101 - 111 mmol/L   CO2 21 (L) 22 - 32 mmol/L   Glucose, Bld 122 (H) 65 - 99 mg/dL   BUN 6 6 - 20 mg/dL   Creatinine, Ser 0.79 0.44 - 1.00 mg/dL   Calcium 8.5 (L) 8.9 - 10.3 mg/dL   Total Protein 6.2 (L) 6.5 - 8.1 g/dL   Albumin 3.7 3.5 - 5.0 g/dL   AST 27 15 - 41 U/L   ALT 17 14 - 54 U/L   Alkaline Phosphatase 36 (L) 38 - 126 U/L   Total Bilirubin 0.5 0.3 - 1.2 mg/dL   GFR calc non Af Amer >60 >60 mL/min   GFR calc Af Amer >60 >60 mL/min    Comment: (NOTE) The eGFR has been calculated using the CKD EPI equation. This calculation has not been validated in all clinical situations. eGFR's persistently <60 mL/min signify possible Chronic Kidney Disease.    Anion gap 10 5 - 15  CBC     Status: Abnormal   Collection Time: 11/12/16  4:56 AM  Result Value Ref Range   WBC 11.5 (H) 4.0 - 10.5 K/uL    RBC 4.59 3.87 - 5.11 MIL/uL   Hemoglobin 14.0 12.0 - 15.0 g/dL   HCT 40.8 36.0 - 46.0 %   MCV 88.9 78.0 - 100.0 fL   MCH 30.5 26.0 - 34.0 pg   MCHC 34.3 30.0 - 36.0 g/dL   RDW 14.0 11.5 - 15.5 %   Platelets 226 150 - 400 K/uL  Magnesium     Status: None   Collection Time: 11/12/16  4:56 AM  Result Value Ref Range   Magnesium 1.8 1.7 -  2.4 mg/dL  I-Stat beta hCG blood, ED     Status: None   Collection Time: 11/12/16  5:07 AM  Result Value Ref Range   I-stat hCG, quantitative <5.0 <5 mIU/mL   Comment 3            Comment:   GEST. AGE      CONC.  (mIU/mL)   <=1 WEEK        5 - 50     2 WEEKS       50 - 500     3 WEEKS       100 - 10,000     4 WEEKS     1,000 - 30,000        FEMALE AND NON-PREGNANT FEMALE:     LESS THAN 5 mIU/mL   Urinalysis, Routine w reflex microscopic     Status: None   Collection Time: 11/12/16  8:20 AM  Result Value Ref Range   Color, Urine YELLOW YELLOW   APPearance CLEAR CLEAR   Specific Gravity, Urine 1.015 1.005 - 1.030   pH 6.0 5.0 - 8.0   Glucose, UA NEGATIVE NEGATIVE mg/dL   Hgb urine dipstick NEGATIVE NEGATIVE   Bilirubin Urine NEGATIVE NEGATIVE   Ketones, ur NEGATIVE NEGATIVE mg/dL   Protein, ur NEGATIVE NEGATIVE mg/dL   Nitrite NEGATIVE NEGATIVE   Leukocytes, UA NEGATIVE NEGATIVE  Blood culture (routine x 2)     Status: None (Preliminary result)   Collection Time: 11/12/16 10:28 AM  Result Value Ref Range   Specimen Description BLOOD RIGHT WRIST    Special Requests      BOTTLES DRAWN AEROBIC AND ANAEROBIC Blood Culture adequate volume   Culture      NO GROWTH 1 DAY Performed at [facility], [place] [address] [place], [place] [address]    Report Status PENDING   Blood culture (routine x 2)     Status: None (Preliminary result)   Collection Time: 11/12/16 10:28 AM  Result Value Ref Range   Specimen Description BLOOD LEFT ANTECUBITAL    Special Requests      BOTTLES DRAWN AEROBIC AND ANAEROBIC Blood Culture adequate volume    Culture      NO GROWTH 1 DAY Performed at [facility], [place] [address], [place], [place] [address]    Report Status PENDING   I-Stat CG4 Lactic Acid, ED     Status: None   Collection Time: 11/12/16 10:42 AM  Result Value Ref Range   Lactic Acid, Venous 1.24 0.5 - 1.9 mmol/L  HIV antibody (Routine Testing)     Status: None   Collection Time: 11/12/16  2:54 PM  Result Value Ref Range   HIV Screen 4th Generation wRfx Non Reactive Non Reactive    Comment: (NOTE) Performed At: [facility] [place], [place] [id number] Lindon Romp MD RF:[id number]   Basic metabolic panel     Status: Abnormal   Collection Time: 11/12/16  2:54 PM  Result Value Ref Range   Sodium 140 135 - 145 mmol/L   Potassium 4.0 3.5 - 5.1 mmol/L    Comment: DELTA CHECK NOTED NO VISIBLE HEMOLYSIS REPEATED TO VERIFY    Chloride 115 (H) 101 - 111 mmol/L   CO2 16 (L) 22 - 32 mmol/L   Glucose, Bld 101 (H) 65 - 99 mg/dL   BUN <5 (L) 6 - 20 mg/dL   Creatinine, Ser 0.56 0.44 - 1.00 mg/dL   Calcium 7.8 (  L) 8.9 - 10.3 mg/dL   GFR calc non Af Amer >60 >60 mL/min   GFR calc Af Amer >60 >60 mL/min    Comment: (NOTE) The eGFR has been calculated using the CKD EPI equation. This calculation has not been validated in all clinical situations. eGFR's persistently <60 mL/min signify possible Chronic Kidney Disease.    Anion gap 9 5 - 15  Phosphorus     Status: Abnormal   Collection Time: 11/12/16  2:54 PM  Result Value Ref Range   Phosphorus 2.2 (L) 2.5 - 4.6 mg/dL  Basic metabolic panel     Status: Abnormal   Collection Time: 11/13/16  8:28 AM  Result Value Ref Range   Sodium 140 135 - 145 mmol/L   Potassium 3.3 (L) 3.5 - 5.1 mmol/L    Comment: DELTA CHECK NOTED   Chloride 111 101 - 111 mmol/L   CO2 21 (L) 22 - 32 mmol/L   Glucose, Bld 86 65 - 99 mg/dL   BUN <5 (L) 6 - 20 mg/dL   Creatinine, Ser 0.45 0.44 - 1.00 mg/dL   Calcium 8.0 (L) 8.9 - 10.3 mg/dL   GFR calc non Af Amer >60 >60  mL/min   GFR calc Af Amer >60 >60 mL/min    Comment: (NOTE) The eGFR has been calculated using the CKD EPI equation. This calculation has not been validated in all clinical situations. eGFR's persistently <60 mL/min signify possible Chronic Kidney Disease.    Anion gap 8 5 - 15  CBC     Status: Abnormal   Collection Time: 11/13/16  8:28 AM  Result Value Ref Range   WBC 6.5 4.0 - 10.5 K/uL   RBC 3.77 (L) 3.87 - 5.11 MIL/uL   Hemoglobin 11.4 (L) 12.0 - 15.0 g/dL   HCT 40.9 (L) 81.1 - 91.4 %   MCV 88.6 78.0 - 100.0 fL   MCH 30.2 26.0 - 34.0 pg   MCHC 34.1 30.0 - 36.0 g/dL   RDW 78.2 95.6 - 21.3 %   Platelets 212 150 - 400 K/uL    Ct Abdomen Pelvis Wo Contrast  Result Date: 11/12/2016 CLINICAL DATA:  Right upper quadrant abdominal pain and nausea. EXAM: CT ABDOMEN AND PELVIS WITHOUT CONTRAST TECHNIQUE: Multidetector CT imaging of the abdomen and pelvis was performed following the standard protocol without IV contrast. COMPARISON:  Right upper quadrant ultrasound from same day. CT abdomen pelvis dated April 21, 2015. FINDINGS: Lower chest: No acute abnormality. Hepatobiliary: No focal liver abnormality is seen. Status post cholecystectomy. No biliary dilatation. Pancreas: Unremarkable. No pancreatic ductal dilatation or surrounding inflammatory changes. Spleen: Normal in size without focal abnormality. Adrenals/Urinary Tract: Adrenal glands are unremarkable. Kidneys are normal, without renal calculi, focal lesion, or hydronephrosis. Bladder is unremarkable. Stomach/Bowel: Mild wall thickening of the ascending and proximal transverse colon. The stomach is within normal limits. The appendix is surgically absent. No bowel obstruction. Vascular/Lymphatic: No significant vascular findings are present. No enlarged abdominal or pelvic lymph nodes. Reproductive: Uterus and bilateral adnexa are unremarkable. Partially visualized 10 mm low-density lesion along the left perineum, unchanged. Tampon in the  vagina. Other: No free fluid or pneumoperitoneum. Musculoskeletal: No acute or significant osseous findings. IMPRESSION: 1. Mild wall thickening of the ascending and proximal transverse colon, consistent with colitis. No bowel obstruction. 2. Unchanged 1.0 cm left Bartholin's gland cyst. Electronically Signed   By: Obie Dredge M.D.   On: 11/12/2016 09:38   US Abdomen Limited Ruq  Result Date: 11/12/2016 CLINICAL  DATA:  RIGHT upper quadrant pain. History of pancreatitis. Status post cholecystectomy. EXAM: ULTRASOUND ABDOMEN LIMITED RIGHT UPPER QUADRANT COMPARISON:  Abdominal ultrasound July 27, 2016 FINDINGS: Gallbladder: Status post cholecystectomy. No fluid collections within the gallbladder fossa. Common bile duct: Diameter: 4 mm.  No choledocholithiasis. Liver: No focal lesion identified. Within normal limits in parenchymal echogenicity. Portal vein is patent on color Doppler imaging with normal direction of blood flow towards the liver. IMPRESSION: No acute RIGHT upper quadrant process.  Status post cholecystectomy. Electronically Signed   By: Elon Alas M.D.   On: 11/12/2016 06:41    ROS negative except above Blood pressure (!) 113/59, pulse 79, temperature 98.9 F (37.2 C), temperature source Oral, resp. rate 18, height _0  (1.651 m), weight 70.4 kg (155 lb 3.3 oz), last menstrual period 11/08/2016, SpO2 100 %, currently breastfeeding. Physical Exam vital signs stable afebrile no acute distress although she did become emotional after I pushed on her belly which was pertinent for some right upper quadrant and midepigastric discomfort and mild right lower quadrant pain and nontender left side CT and labs reviewed  Assessment/Plan: Chronic abdominal pain with periodic nausea vomiting questionable etiology doubt patient has Crohn's disease since she's been in the ER multiple times and has had multiple CTs and that has never showed up and I doubt her mild colitis on this CT is significant  based on no lower bowel complaints Plan: We'll allow clear liquid diet and will try pancreatic enzymes in addition to her IV pump inhibitors and she might need endoscopic studies at some point to be sure and possibly gastric emptying studies porphyria screen's etc. and she says she's never had any previous endoscopic studies which I find hard to believe and will check on tomorrow and could consider a [facility] Crohn's screening blood test which I'm not sure if the hospital can do but might be a good first outpatient test although the results take a few weeks to get back  [facility] 11/13/2016, 2:55 PM

## 2016-11-14 DIAGNOSIS — R1011 Right upper quadrant pain: Secondary | ICD-10-CM

## 2016-11-14 LAB — BASIC METABOLIC PANEL
ANION GAP: 10 (ref 5–15)
CALCIUM: 8.4 mg/dL — AB (ref 8.9–10.3)
CO2: 22 mmol/L (ref 22–32)
Chloride: 108 mmol/L (ref 101–111)
Creatinine, Ser: 0.55 mg/dL (ref 0.44–1.00)
GFR calc Af Amer: >60 mL/min (ref >60)
GFR calc non Af Amer: >60 mL/min (ref >60)
GLUCOSE: 73 mg/dL (ref 65–99)
POTASSIUM: 2.8 mmol/L — AB (ref 3.5–5.1)
Sodium: 140 mmol/L (ref 135–145)

## 2016-11-14 MED ORDER — POTASSIUM CHLORIDE CRYS ER 20 MEQ PO TBCR
40.0000 meq | EXTENDED_RELEASE_TABLET | Freq: Two times a day (BID) | ORAL | Status: AC
  Administered 2016-11-14 (×2): 40 meq via ORAL
  Filled 2016-11-14 (×2): qty 2

## 2016-11-14 NOTE — Progress Notes (Signed)
PROGRESS NOTE    Jasmine [name]  HDQ:[id number] DOB: 1990/06/07 DOA: 11/12/2016 PCP: Patient, No Pcp Per   Brief Narrative: Jasmine [name] is a 26 y.o. 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 She was found to have colitis of the ascending and proximal transverse colon.   Assessment & Plan:   Principal Problem:   Colitis Active Problems:   Asthma   Low blood potassium   Seizure (HCC)   Colitis of the ascending and proximal transverse colon:  Admitted for IV antibiotics with cipro and flagyl, IV antibiotics, IV pain control and IV anti emetics.  Pt reports abdominal symptoms of nausea, vomiting since many years and she would benefit from a GI consult for further evaluation, esp rule out crohn's disease. Her symptoms today appear to have improved, nausea is better, but one episode of bilious vomiting earlier this am. abd pain has improved.  GI consulted and appreciate recommendations.    Asthma: no wheezing heard ,    Hypokalemia: replaced. Repeat bmp pending this am.     Mild normocytic anemia:  Hemoglobin stable around 11.   H/o seizures:  On IV keppra.    GERD  Resume PPI.    Hypophosphatemia:  Replaced.    Leukocytosis:  Possibly from colitis. Resolved.     DVT prophylaxis: lovenox.  Code Status: full code.  Family Communication: family at bedside.  Disposition Plan: pending GI evaluation and on resolution of the symptoms, able to tolerate soft diet before discharge.    Consultants:   Gastroenterology.    Procedures: none.   Antimicrobials: ciprofloxacin. And flagyl since admission.   Subjective: Nausea improved with meds.  One episode of vomiting.   Objective: Vitals:   11/13/16 1420 11/13/16 1446 11/13/16 2123 11/14/16 0532  BP:  (!) 113/59 (!) 114/56  (!) 120/53  Pulse: 74 79 84 81  Resp:  18 18 18   Temp:  98.9 F (37.2 C) 98.7 F (37.1 C) 99.1 F (37.3 C)  TempSrc:  Oral Oral Oral  SpO2: 100% 100% 100% 100%  Weight:    72.3 kg (159 lb 6.3 oz)  Height:        Intake/Output Summary (Last 24 hours) at 11/14/16 1040 Last data filed at 11/14/16 0635  Gross per 24 hour  Intake              605 ml  Output             1003 ml  Net             -398 ml   Filed Weights   11/12/16 0430 11/13/16 0630 11/14/16 0532  Weight: 68.5 kg (151 lb) 70.4 kg (155 lb 3.3 oz) 72.3 kg (159 lb 6.3 oz)    Examination:  General exam: alert and comfortable.  Respiratory system: Clear to auscultation. Respiratory effort normal. No wheezing or rhonchi.  Cardiovascular system: S1 & S2 heard, RRR. No JVD, or murmer. No pedal edema. Gastrointestinal system: Abdomen is soft mild to mod gen tenderness. Bowel sounds heard. No distention.  Central nervous system: Alert and oriented. Non focal.  Extremities: Symmetric 5 x 5 power. No cyanosis or clubbing.  Skin: No rashes, lesions or ulcers Psychiatry: mood appropriate.     Data Reviewed: I have personally reviewed following labs and imaging studies  CBC:  Recent Labs Lab 11/12/16 0456 11/13/16  0828  WBC 11.5* 6.5  HGB 14.0 11.4*  HCT 40.8 33.4*  MCV 88.9 88.6  PLT 226 401   Basic Metabolic Panel:  Recent Labs Lab 11/12/16 0456 11/12/16 1454 11/13/16 0828  NA 138 140 140  K 2.8* 4.0 3.3*  CL 107 115* 111  CO2 21* 16* 21*  GLUCOSE 122* 101* 86  BUN 6 <5* <5*  CREATININE 0.79 0.56 0.59  CALCIUM 8.5* 7.8* 8.0*  MG 1.8  --   --   PHOS  --  2.2*  --    GFR: Estimated Creatinine Clearance: 107.1 mL/min (by C-G formula based on SCr of 0.59 mg/dL). Liver Function Tests:  Recent Labs Lab 11/12/16 0456  AST 27  ALT 17  ALKPHOS 36*  BILITOT 0.5  PROT 6.2*  ALBUMIN 3.7    Recent Labs Lab 11/12/16 0456  LIPASE 15   No results for input(s): AMMONIA in the last 168  hours. Coagulation Profile: No results for input(s): INR, PROTIME in the last 168 hours. Cardiac Enzymes: No results for input(s): CKTOTAL, CKMB, CKMBINDEX, TROPONINI in the last 168 hours. BNP (last 3 results) No results for input(s): PROBNP in the last 8760 hours. HbA1C: No results for input(s): HGBA1C in the last 72 hours. CBG: No results for input(s): GLUCAP in the last 168 hours. Lipid Profile: No results for input(s): CHOL, HDL, LDLCALC, TRIG, CHOLHDL, LDLDIRECT in the last 72 hours. Thyroid Function Tests: No results for input(s): TSH, T4TOTAL, FREET4, T3FREE, THYROIDAB in the last 72 hours. Anemia Panel: No results for input(s): VITAMINB12, FOLATE, FERRITIN, TIBC, IRON, RETICCTPCT in the last 72 hours. Sepsis Labs:  Recent Labs Lab 11/12/16 1042  LATICACIDVEN 1.24    Recent Results (from the past 240 hour(s))  Blood culture (routine x 2)     Status: None (Preliminary result)   Collection Time: 11/12/16 10:28 AM  Result Value Ref Range Status   Specimen Description BLOOD RIGHT WRIST  Final   Special Requests   Final    BOTTLES DRAWN AEROBIC AND ANAEROBIC Blood Culture adequate volume   Culture   Final    NO GROWTH 1 DAY Performed at [facility], [place] [address] [place], [place] [address]    Report Status PENDING  Incomplete  Blood culture (routine x 2)     Status: None (Preliminary result)   Collection Time: 11/12/16 10:28 AM  Result Value Ref Range Status   Specimen Description BLOOD LEFT ANTECUBITAL  Final   Special Requests   Final    BOTTLES DRAWN AEROBIC AND ANAEROBIC Blood Culture adequate volume   Culture   Final    NO GROWTH 1 DAY Performed at [facility], [place] [address] [place], [place] [address]    Report Status PENDING  Incomplete         Radiology Studies: No results found.      Scheduled Meds: . enoxaparin (LOVENOX) injection  40 mg Subcutaneous Q24H  . lipase/protease/amylase  36,000 Units Oral TID AC  . pantoprazole  (PROTONIX) IV  40 mg Intravenous Q24H  . sodium chloride flush  3 mL Intravenous Q12H   Continuous Infusions: . ciprofloxacin Stopped (11/14/16 0150)  . levETIRAcetam 500 mg (11/14/16 0920)  . metronidazole Stopped (11/14/16 0635)     LOS: 1 day    Time spent: 35 MINUTES.     Hosie Poisson, MD [facility] Pager [phone]   If 7PM-7AM, please contact night-coverage [email or web address] Password [email or web address] 11/14/2016, 10:40 AM

## 2016-11-14 NOTE — Progress Notes (Signed)
Updated MD with potassium level. Verbal order for 59meq potassium PO for two doses. Give 1 dose now and 2nd dose at 10 pm.

## 2016-11-14 NOTE — Progress Notes (Signed)
Jasmine [name] 2:08 PM  Subjective: Patient may be a hair better and that the medicine seemed to be helping some but she does have some bilious vomiting and still complains of pain and no new complaints  Objective: Vital signs stable afebrile patient about the same seems histrionic to me abdomen is significantly tender seemingly overreacting to pain without guarding or rebound soft occasional bowel sounds walking slumped over  Assessment: Chronic abdominal pain questionably etiology  Plan: The risks benefits methods of endoscopy was discussed with the patient and her boyfriend and will proceed tomorrow morning with further workup and plans pending those findings but I'm not sure she needs antibiotics and I'm not sure if we will find anything and she probably will need a therapist at some point  [facility] E  Pager [phone] After 5PM or if no answer call [phone]

## 2016-11-14 NOTE — Progress Notes (Signed)
Pt stated she was unable to eat food "anything" because of N&V. Later pt spouse brought wendys into pt room. When pt up for bathroom, I observed french fries in her bed. No compalints of N&V

## 2016-11-15 ENCOUNTER — Encounter (HOSPITAL_COMMUNITY)
Admission: EM | Disposition: A | Discharge status: Home / Self Care | Payer: Self-pay | Source: Home / Self Care | Attending: Family Medicine

## 2016-11-15 ENCOUNTER — Inpatient Hospital Stay (HOSPITAL_COMMUNITY): Payer: Medicaid Other | Admitting: Anesthesiology

## 2016-11-15 ENCOUNTER — Encounter (HOSPITAL_COMMUNITY): Payer: Self-pay

## 2016-11-15 DIAGNOSIS — K529 Noninfective gastroenteritis and colitis, unspecified: Principal | ICD-10-CM

## 2016-11-15 DIAGNOSIS — R112 Nausea with vomiting, unspecified: Secondary | ICD-10-CM

## 2016-11-15 DIAGNOSIS — R569 Unspecified convulsions: Secondary | ICD-10-CM

## 2016-11-15 DIAGNOSIS — J4520 Mild intermittent asthma, uncomplicated: Secondary | ICD-10-CM

## 2016-11-15 DIAGNOSIS — E876 Hypokalemia: Secondary | ICD-10-CM

## 2016-11-15 HISTORY — PX: ESOPHAGOGASTRODUODENOSCOPY (EGD) WITH PROPOFOL: SHX5813

## 2016-11-15 LAB — CBC
HCT: 34.2 % — ABNORMAL LOW (ref 36.0–46.0)
Hemoglobin: 11.4 g/dL — ABNORMAL LOW (ref 12.0–15.0)
MCH: 30 pg (ref 26.0–34.0)
MCHC: 33.3 g/dL (ref 30.0–36.0)
MCV: 90 fL (ref 78.0–100.0)
PLATELETS: 246 10*3/uL (ref 150–400)
RBC: 3.8 MIL/uL — ABNORMAL LOW (ref 3.87–5.11)
RDW: 14 % (ref 11.5–15.5)
WBC: 6.7 10*3/uL (ref 4.0–10.5)

## 2016-11-15 LAB — BASIC METABOLIC PANEL
ANION GAP: 6 (ref 5–15)
CALCIUM: 8.4 mg/dL — AB (ref 8.9–10.3)
CO2: 24 mmol/L (ref 22–32)
Chloride: 112 mmol/L — ABNORMAL HIGH (ref 101–111)
Creatinine, Ser: 0.5 mg/dL (ref 0.44–1.00)
GFR calc Af Amer: >60 mL/min (ref >60)
GLUCOSE: 98 mg/dL (ref 65–99)
Potassium: 3.7 mmol/L (ref 3.5–5.1)
Sodium: 142 mmol/L (ref 135–145)

## 2016-11-15 SURGERY — ESOPHAGOGASTRODUODENOSCOPY (EGD) WITH PROPOFOL
Anesthesia: Monitor Anesthesia Care

## 2016-11-15 MED ORDER — LIDOCAINE 2% (20 MG/ML) 5 ML SYRINGE
INTRAMUSCULAR | Status: DC | PRN
  Administered 2016-11-15: 100 mg via INTRAVENOUS

## 2016-11-15 MED ORDER — LIDOCAINE 2% (20 MG/ML) 5 ML SYRINGE
INTRAMUSCULAR | Status: AC
  Filled 2016-11-15: qty 5

## 2016-11-15 MED ORDER — PROPOFOL 10 MG/ML IV BOLUS
INTRAVENOUS | Status: AC
  Filled 2016-11-15: qty 40

## 2016-11-15 MED ORDER — PROPOFOL 10 MG/ML IV BOLUS
INTRAVENOUS | Status: DC | PRN
Start: 2016-11-15 — End: 2016-11-15
  Administered 2016-11-15 (×2): 30 mg via INTRAVENOUS
  Administered 2016-11-15 (×3): 20 mg via INTRAVENOUS
  Administered 2016-11-15 (×2): 30 mg via INTRAVENOUS
  Administered 2016-11-15: 20 mg via INTRAVENOUS

## 2016-11-15 MED ORDER — LACTATED RINGERS IV SOLN
INTRAVENOUS | Status: DC
  Administered 2016-11-15: 11:00:00 via INTRAVENOUS

## 2016-11-15 SURGICAL SUPPLY — 14 items

## 2016-11-15 NOTE — Anesthesia Preprocedure Evaluation (Signed)
Anesthesia Evaluation  Patient identified by MRN, date of birth, ID band Patient awake    Reviewed: Allergy & Precautions, H&P , Patient's Chart, lab work & pertinent test results, reviewed documented beta blocker date and time   Airway Mallampati: II  TM Distance: >3 FB Neck ROM: full    Dental no notable dental hx.    Pulmonary former smoker,    Pulmonary exam normal breath sounds clear to auscultation       Cardiovascular  Rhythm:regular Rate:Normal     Neuro/Psych    GI/Hepatic   Endo/Other    Renal/GU      Musculoskeletal   Abdominal   Peds  Hematology   Anesthesia Other Findings   Reproductive/Obstetrics                             Anesthesia Physical Anesthesia Plan  ASA: II  Anesthesia Plan: MAC   Post-op Pain Management:    Induction: Intravenous  PONV Risk Score and Plan: 1 and Ondansetron and Dexamethasone  Airway Management Planned: Mask and Natural Airway  Additional Equipment:   Intra-op Plan:   Post-operative Plan:   Informed Consent: I have reviewed the patients History and Physical, chart, labs and discussed the procedure including the risks, benefits and alternatives for the proposed anesthesia with the patient or authorized representative who has indicated his/her understanding and acceptance.   Dental Advisory Given  Plan Discussed with: CRNA and Surgeon  Anesthesia Plan Comments:         Anesthesia Quick Evaluation

## 2016-11-15 NOTE — Progress Notes (Signed)
Jasmine [name] 11:33 AM  Subjective: Patient without any new complaints actually feeling better  Objective: Vital signs stable afebrile no acute distress unless we pushed on her belly which seems to be chronically tender and doesn't matter how lighter how hard other physical exam fine please see preassessment evaluation labs stable  Assessment: Chronic abdominal pain with probable significant psych overlay  Plan: Will proceed with endoscopy with anesthesia assistance with further workup and plans pending those findings  [facility] E  Pager [phone] After 5PM or if no answer call [phone]

## 2016-11-15 NOTE — Transfer of Care (Signed)
Immediate Anesthesia Transfer of Care Note  Patient: Jasmine [name]  Procedure(s) Performed: ESOPHAGOGASTRODUODENOSCOPY (EGD) WITH PROPOFOL (N/A )  Patient Location: PACU and Endoscopy Unit  Anesthesia Type:MAC  Level of Consciousness: sedated  Airway & Oxygen Therapy: Patient Spontanous Breathing and Patient connected to nasal cannula oxygen  Post-op Assessment: Report given to RN and Post -op Vital signs reviewed and stable  Post vital signs: Reviewed and stable  Last Vitals:  Vitals:   11/15/16 0500 11/15/16 1107  BP: (!) 111/58 113/73  Pulse: 80 80  Resp: 18 18  Temp: 36.8 C 37.4 C  SpO2: 99% 100%    Last Pain:  Vitals:   11/15/16 1107  TempSrc: Oral  PainSc:       Patients Stated Pain Goal: 4 (17/40/81 4481)  Complications: No apparent anesthesia complications

## 2016-11-15 NOTE — Op Note (Signed)
[facility] Patient Name: Joshlyn Beadle Procedure Date: 11/15/2016 MRN: [id number] Attending MD: Clarene Essex , MD Date of Birth: Jun 04, 1990 CSN: [id number] Age: 26 Admit Type: Inpatient Procedure:                Upper GI endoscopy Indications:              Epigastric abdominal pain, Abdominal pain in the                            right upper quadrant nausea vomiting Providers:                Clarene Essex, MD, Burtis Junes, RN, Corliss Parish,                            Technician Referring MD:              Medicines:                Propofol total dose 200 mg IV 100 mg IV lidocaine Complications:            No immediate complications. Estimated Blood Loss:     Estimated blood loss: none. Procedure:                Pre-Anesthesia Assessment:                           - Prior to the procedure, a History and Physical                            was performed, and patient medications and                            allergies were reviewed. The patient's tolerance of                            previous anesthesia was also reviewed. The risks                            and benefits of the procedure and the sedation                            options and risks were discussed with the patient.                            All questions were answered, and informed consent                            was obtained. Prior Anticoagulants: The patient has                            taken no previous anticoagulant or antiplatelet                            agents. ASA Grade Assessment: II - A patient with  mild systemic disease. After reviewing the risks                            and benefits, the patient was deemed in                            satisfactory condition to undergo the procedure.                           After obtaining informed consent, the endoscope was                            passed under direct vision. Throughout the     procedure, the patient's blood pressure, pulse, and                            oxygen saturations were monitored continuously. The                            EG-[id number] ([id number]) scope was introduced through the                            mouth, and advanced to the third part of duodenum.                            The upper GI endoscopy was accomplished without                            difficulty. The patient tolerated the procedure                            well. Scope In: Scope Out: Findings:      The larynx was normal.      The examined esophagus was normal.      The entire examined stomach was normal.      The duodenal bulb, first portion of the duodenum, second portion of the       duodenum, area of the papilla and third portion of the duodenum were       normal.      The exam was otherwise without abnormality. Impression:               - Normal larynx.                           - Normal esophagus.                           - Normal stomach.                           - Normal duodenal bulb, first portion of the                            duodenum, second portion of the duodenum, area of  the papilla and third portion of the duodenum.                           - The examination was otherwise normal.                           - No specimens collected. Moderate Sedation:      N/A- Per Anesthesia Care Recommendation:           - Patient has a contact number available for                            emergencies. The signs and symptoms of potential                            delayed complications were discussed with the                            patient. Return to normal activities tomorrow.                            Written discharge instructions were provided to the                            patient.                           - Full liquid diet today. Slowly advance as                            tolerated                           - Continue present  medications.                           - Return to GI clinic PRN. Believe patient should                            have outpatient university workup to make sure she                            doesn't have anything rare like porphyria etc. and                            for now she would be best served with may be a                            [facility] blood test lab to rule out Crohn's                            anybody's and if that test was positive then we                            could proceed with a  colonoscopy but no need to                            proceed with that now                           - Telephone GI clinic if symptomatic PRN. Believe                            she needs significant psychological evaluation to                            better deal with her chronic issues and pain issues                            before any at risk GI further evaluation Procedure Code(s):        --- Professional ---                           [phone], Esophagogastroduodenoscopy, flexible,                            transoral; diagnostic, including collection of                            specimen(s) by brushing or washing, when performed                            (separate procedure) Diagnosis Code(s):        --- Professional ---                           R10.13, Epigastric pain                           R10.11, Right upper quadrant pain CPT copyright 2016 American Medical Association. All rights reserved. The codes documented in this report are preliminary and upon coder review may  be revised to meet current compliance requirements. Clarene Essex, MD 11/15/2016 12:11:50 PM This report has been signed electronically. Number of Addenda: 0

## 2016-11-15 NOTE — Anesthesia Postprocedure Evaluation (Signed)
Anesthesia Post Note  Patient: Jasmine [name]  Procedure(s) Performed: ESOPHAGOGASTRODUODENOSCOPY (EGD) WITH PROPOFOL (N/A )     Patient location during evaluation: PACU Anesthesia Type: MAC Level of consciousness: awake and alert Pain management: pain level controlled Vital Signs Assessment: post-procedure vital signs reviewed and stable Respiratory status: spontaneous breathing, nonlabored ventilation, respiratory function stable and patient connected to nasal cannula oxygen Cardiovascular status: stable and blood pressure returned to baseline Postop Assessment: no apparent nausea or vomiting Anesthetic complications: no    Last Vitals:  Vitals:   11/15/16 1211 11/15/16 1220  BP: (!) 110/39 116/76  Pulse: 99 83  Resp: 18 17  Temp: 36.9 C   SpO2: 100% 100%    Last Pain:  Vitals:   11/15/16 1315  TempSrc:   PainSc: 3                  [name] EDWARD

## 2016-11-15 NOTE — Progress Notes (Signed)
PROGRESS NOTE    Jasmine [name]  DQQ:[id number] DOB: 1990-11-03 DOA: 11/12/2016 PCP: Patient, No Pcp Per   Brief Narrative: Jasmine [name] is a 26 y.o. 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 She was found to have colitis of the ascending and proximal transverse colon.   Assessment & Plan:  Principal Problem:   Colitis Active Problems:   Asthma   Low blood potassium   Seizure (HCC)  Colitis of the ascending and proximal transverse colon: Presumed infectious.  - Continue IV cipro/flagyl, would convert to po abx in next 24 hrs if improving.  - Liquid diet today per GI following negative EGD. Advance in AM if stable and DC if tolerating soft diet.  - Continue IV analgesics, antiemetics. - GI planning outpatient work up  Asthma: No exacerbation/hypoxia - Monitor  Hypokalemia: Resolved with replacement, likely due to emesis and poor per oral intake.  Mild normocytic anemia: Hemoglobin stable around 11.   H/o seizures:  - Continue IV keppra while not reliably taking po.   GERD: Chronic, stable - Resume PPI.   Hypophosphatemia: Replaced.   Leukocytosis: Possibly from colitis. Resolved.   DVT prophylaxis: lovenox.  Code Status: Full Family Communication: Husband at bedside Disposition Plan: Home pending tolerance of diet.   Consultants:   Gastroenterology, Dr. Watt Climes.   Procedures:  EGD 11/15/2016 by Dr. Watt Climes:  - Full liquid diet today. Slowly advance as tolerated - Continue present medications. - Return to GI clinic PRN. Believe patient should have outpatient university workup to make sure she doesn't have anything rare like porphyria etc. and for now she would be best served with may be a [facility] blood test lab to rule out Crohn's anybody's and if that test was positive then we  could proceed with a colonoscopy but no need to proceed with that now - Telephone GI clinic if symptomatic PRN. Believe she needs significant psychological evaluation to better deal with her chronic issues and pain issues before any at risk GI further evaluation   Antimicrobials: Ciprofloxacin/flagyl  Subjective: Still nauseated with emesis of stomach contents overnight. Abdominal pain improving.   Objective: Vitals:   11/15/16 1210 11/15/16 1211 11/15/16 1220 11/15/16 1510  BP: (!) 110/39 (!) 110/39 116/76 124/75  Pulse:  99 83 66  Resp: 18 18 17 18   Temp:  98.5 F (36.9 C)  99.4 F (37.4 C)  TempSrc:  Oral  Oral  SpO2:  100% 100% 100%  Weight:      Height:       Gen: Well-appearing 26 y.o.female in NAD HEENT: MMM, posterior oropharynx clear Pulm: Non-labored; CTAB, no wheezes  CV: Regular rate, no murmur appreciated; distal pulses intact/symmetric GI: + BS; soft, non-tender, non-distended Skin: No rashes, wounds, ulcers Neuro: A&Ox3, CN II-XII without deficits  CBC:  Recent Labs Lab 11/12/16 0456 11/13/16 0828 11/15/16 0445  WBC 11.5* 6.5 6.7  HGB 14.0 11.4* 11.4*  HCT 40.8 33.4* 34.2*  MCV 88.9 88.6 90.0  PLT 226 212 194   Basic Metabolic Panel:  Recent Labs Lab 11/12/16 0456 11/12/16 1454 11/13/16 0828 11/14/16 1025 11/15/16 0445  NA 138 140 140 140 142  K 2.8* 4.0 3.3* 2.8* 3.7  CL 107 115* 111 108 112*  CO2 21* 16* 21* 22 24  GLUCOSE 122* 101* 86 73 98  BUN 6 <5* <5* <5* <5*  CREATININE  0.79 0.56 0.59 0.55 0.50  CALCIUM 8.5* 7.8* 8.0* 8.4* 8.4*  MG 1.8  --   --   --   --   PHOS  --  2.2*  --   --   --    GFR: Estimated Creatinine Clearance: 105.2 mL/min (by C-G formula based on SCr of 0.5 mg/dL). Liver Function Tests:  Recent Labs Lab 11/12/16 0456  AST 27  ALT 17  ALKPHOS 36*  BILITOT 0.5  PROT 6.2*  ALBUMIN 3.7    Recent Labs Lab 11/12/16 0456  LIPASE 15   Sepsis Labs:  Recent Labs Lab 11/12/16 1042  LATICACIDVEN 1.24     Recent Results (from the past 240 hour(s))  Blood culture (routine x 2)     Status: None (Preliminary result)   Collection Time: 11/12/16 10:28 AM  Result Value Ref Range Status   Specimen Description BLOOD RIGHT WRIST  Final   Special Requests   Final    BOTTLES DRAWN AEROBIC AND ANAEROBIC Blood Culture adequate volume   Culture   Final    NO GROWTH 3 DAYS Performed at [facility], [place] [address] [place], [place] [address]    Report Status PENDING  Incomplete  Blood culture (routine x 2)     Status: None (Preliminary result)   Collection Time: 11/12/16 10:28 AM  Result Value Ref Range Status   Specimen Description BLOOD LEFT ANTECUBITAL  Final   Special Requests   Final    BOTTLES DRAWN AEROBIC AND ANAEROBIC Blood Culture adequate volume   Culture   Final    NO GROWTH 3 DAYS Performed at [facility], [place] [address] [place],  [address]    Report Status PENDING  Incomplete      LOS: 2 days    Time spent: 25 MINUTES.   Vance Gather, MD [facility] Pager [phone]   If 7PM-7AM, please contact night-coverage [email or web address] Password [facility] 11/15/2016, 5:36 PM

## 2016-11-16 ENCOUNTER — Encounter (HOSPITAL_COMMUNITY): Payer: Self-pay | Admitting: Gastroenterology

## 2016-11-16 LAB — BASIC METABOLIC PANEL
Anion gap: 6 (ref 5–15)
BUN: <5 mg/dL — ABNORMAL LOW (ref 6–20)
CALCIUM: 8.1 mg/dL — AB (ref 8.9–10.3)
CO2: 26 mmol/L (ref 22–32)
CREATININE: 0.49 mg/dL (ref 0.44–1.00)
Chloride: 108 mmol/L (ref 101–111)
GFR calc non Af Amer: >60 mL/min (ref >60)
GLUCOSE: 109 mg/dL — AB (ref 65–99)
Potassium: 3.1 mmol/L — ABNORMAL LOW (ref 3.5–5.1)
Sodium: 140 mmol/L (ref 135–145)

## 2016-11-16 LAB — GLUCOSE, CAPILLARY: Glucose-Capillary: 96 mg/dL (ref 65–99)

## 2016-11-16 LAB — PHOSPHORUS: PHOSPHORUS: 3.3 mg/dL (ref 2.5–4.6)

## 2016-11-16 LAB — MAGNESIUM: Magnesium: 1.6 mg/dL — ABNORMAL LOW (ref 1.7–2.4)

## 2016-11-16 MED ORDER — METRONIDAZOLE 500 MG PO TABS: 500.0000 mg | ORAL_TABLET | Freq: Three times a day (TID) | ORAL | 0 refills | Status: AC

## 2016-11-16 MED ORDER — POTASSIUM CHLORIDE CRYS ER 20 MEQ PO TBCR
40.0000 meq | EXTENDED_RELEASE_TABLET | Freq: Two times a day (BID) | ORAL | Status: DC
  Administered 2016-11-16: 40 meq via ORAL
  Filled 2016-11-16: qty 2

## 2016-11-16 MED ORDER — PROMETHAZINE HCL 25 MG/ML IJ SOLN
12.5000 mg | Freq: Once | INTRAMUSCULAR | Status: AC
  Administered 2016-11-16: 12.5 mg via INTRAVENOUS
  Filled 2016-11-16: qty 1

## 2016-11-16 MED ORDER — KCL IN DEXTROSE-NACL 40-5-0.9 MEQ/L-%-% IV SOLN
INTRAVENOUS | Status: DC
  Administered 2016-11-16 (×2): via INTRAVENOUS
  Filled 2016-11-16 (×2): qty 1000

## 2016-11-16 MED ORDER — PROMETHAZINE HCL 12.5 MG PO TABS: 12.5000 mg | ORAL_TABLET | Freq: Four times a day (QID) | ORAL | 0 refills | Status: DC | PRN

## 2016-11-16 MED ORDER — CIPROFLOXACIN HCL 500 MG PO TABS: 500.0000 mg | ORAL_TABLET | Freq: Two times a day (BID) | ORAL | 0 refills | Status: AC

## 2016-11-16 NOTE — Progress Notes (Signed)
Pt tearful with pain in her abdomen and actively vomiting. PRN medication given. Pt has pizza at bedside and husband brought [place] food to the room. When asked if she had eaten any solid food pt declines. Pt educated on following MD orders and staying on a full liquid diet. Will continue to monitor closely.

## 2016-11-16 NOTE — Discharge Summary (Addendum)
Physician Discharge Summary  Nareh Matzke NWG:[id number] DOB: 1990-06-02 DOA: 11/12/2016  PCP: None  Admit date: 11/12/2016 Discharge date: 11/16/2016  Admitted From: Home Disposition: Home   Recommendations for Outpatient Follow-up:  1. 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 She was found to have colitis of the ascending and proximal transverse colon which was treated supportively with improvement. She is tolerating a diet with manageable nausea and pain and wants to go home. GI was consulted, performed an unremarkable EGD, and recommended outpatient follow up.   Discharge Diagnoses:  Principal Problem:   Colitis Active Problems:   Asthma   Low blood potassium   Seizure ([place])  Colitis of the ascending and proximal transverse colon: Presumed infectious. With symptoms of nausea and vomiting, also considering IBS (Dx earlier in life) and cannabinoid hyperemesis (no UDS here, but has used marijuana in the past). No red flags of diarrhea, rectal bleeding, mucus in the stool or weight loss. - Per GI, Dr. Therisa Doyne: "Patient reports she was diagnosed with IBS since the age of 33. She used to live in [place] until recently, she has been evaluated by GI there, however does not remember undergoing a colonoscopy. Patient states Phenergan really helps with nausea, she wants to be on solids. She wants to go home, discussed with patient's hospitalist Dr. Bonner Puna. Recommend completing a course of ciprofloxacin and Flagyl for a total of 7 days, continuing Phenergan as needed every 6 hours  for nausea and vomiting. Outpatient workup for colonoscopy." - Prescriptions for cipro/flagyl and phenergan provided at discharge.   Asthma: No exacerbation/hypoxia - Monitor  Hypokalemia: Resolved with replacement, likely due to emesis and poor per oral intake.  Mild normocytic anemia: Hemoglobin stable around 11.   H/o seizures:  - Continue keppra po now that taking po ok.  GERD: Chronic, stable - Resume PPI.   Hypophosphatemia: Replaced.   Leukocytosis: Possibly from colitis. Resolved.   Discharge Instructions Discharge Instructions    Discharge instructions    Complete by:  As directed    You were admitted for colitis which has improved. You are able to take food by mouth and are stable for discharge.  - Follow up with GI to schedule an outpatient colonoscopy - Continue taking cipro and flagyl as directed for the next 2 days - A prescription was also given for phenergan to take at home as needed. - If your symptoms worsen and you are unable to keep fluids down for a prolonged time despite taking phenergan, seek medical attention.   Increase activity slowly    Complete by:  As directed      Allergies as of 11/16/2016      Reactions   Cheese Shortness Of Breath   Chocolate Shortness Of Breath   Ibuprofen Hives, Shortness Of Breath   Children's ibuprofen   Ivp Dye [iodinated Diagnostic Agents] Shortness Of Breath   Latex Anaphylaxis, Swelling, Other (See Comments)   Reaction:  Localized swelling    Orange Juice [orange Oil] Shortness Of Breath   Other Hives, Other (See Comments)   Pt states that she is allergic to all steroids except IV solu-medrol.     Peach [prunus Persica] Anaphylaxis  Peanuts [peanut Oil] Anaphylaxis   Pear Anaphylaxis   Prednisone Hives   Raspberry Anaphylaxis   Tylenol [acetaminophen] Hives, Shortness Of Breath, Other (See Comments)   Pt states that this is only with the liquid form.     Amoxicillin Hives, Other (See Comments)   Has  patient had a PCN reaction causing immediate rash, facial/tongue/throat swelling, SOB or lightheadedness with hypotension: No Has patient had a PCN reaction causing severe rash involving mucus membranes or skin necrosis: No Has patient had a PCN reaction that required hospitalization: No Has patient had a PCN reaction occurring within the last 10 years: No If all of the above answers are "NO", then may proceed with Cephalosporin use.   Apricot Flavor Hives   Doxycycline Hives   Erythromycin Hives   Milk Of Magnesia [magnesium Hydroxide] Hives, Itching   Penicillins Hives, Other (See Comments)   Has patient had a PCN reaction causing immediate rash, facial/tongue/throat swelling, SOB or lightheadedness with hypotension: No Has patient had a PCN reaction causing severe rash involving mucus membranes or skin necrosis: No Has patient had a PCN reaction that required hospitalization: No Has patient had a PCN reaction occurring within the last 10 years: No If all of the above answers are "NO", then may proceed with Cephalosporin use.      Medication List    TAKE these medications   albuterol 108 (90 Base) MCG/ACT inhaler Commonly known as:  PROVENTIL HFA;VENTOLIN HFA Inhale 2 puffs into the lungs every 4 (four) hours as needed for wheezing or shortness of breath.   albuterol (2.5 MG/3ML) 0.083% nebulizer solution Commonly known as:  PROVENTIL Take 3 mLs (2.5 mg total) by nebulization every 4 (four) hours as needed for wheezing or shortness of breath.   ciprofloxacin 500 MG tablet Commonly known as:  CIPRO Take 1 tablet (500 mg total) by mouth 2 (two) times daily.   ibuprofen 200 MG tablet Commonly known as:  ADVIL,MOTRIN Take 200-400 mg by mouth every 6 (six) hours as needed for moderate pain.   levETIRAcetam 500 MG tablet Commonly known as:  KEPPRA Take 1 tablet (500 mg total) by mouth 2 (two) times daily.   metroNIDAZOLE 500 MG tablet Commonly known as:  FLAGYL Take 1 tablet  (500 mg total) by mouth 3 (three) times daily.   ondansetron 4 MG disintegrating tablet Commonly known as:  ZOFRAN ODT Take 1 tablet (4 mg total) by mouth every 8 (eight) hours as needed for nausea or vomiting.   oxyCODONE 5 MG immediate release tablet Commonly known as:  ROXICODONE Take 1 tablet (5 mg total) by mouth every 4 (four) hours as needed for severe pain.   pantoprazole 20 MG tablet Commonly known as:  PROTONIX Take 1 tablet (20 mg total) by mouth daily.   promethazine 12.5 MG tablet Commonly known as:  PHENERGAN Take 1 tablet (12.5 mg total) by mouth every 6 (six) hours as needed for nausea or vomiting.      Follow-up Information    Ronnette Juniper, MD Follow up.   Specialty:  Gastroenterology Contact information: [address] [place] [address] [phone]          Allergies  Allergen Reactions  . Cheese Shortness Of Breath  . Chocolate Shortness Of Breath  . Ibuprofen Hives and Shortness Of Breath    Children's ibuprofen  . Ivp Dye [Iodinated Diagnostic Agents] Shortness Of Breath  . Latex Anaphylaxis, Swelling and Other (See Comments)    Reaction:  Localized swelling   . Orange Juice [Orange Oil] Shortness Of Breath  . Other Hives and Other (See Comments)    Pt states that she is allergic to all steroids except IV solu-medrol.    Marland Kitchen Peach [Prunus Persica] Anaphylaxis  . Peanuts [Peanut Oil] Anaphylaxis  . Pear Anaphylaxis  . Prednisone Hives  . Raspberry Anaphylaxis  . Tylenol [Acetaminophen] Hives, Shortness Of Breath and Other (See Comments)    Pt states that this is only with the liquid form.    . Amoxicillin Hives and Other (See Comments)    Has patient had a PCN reaction causing immediate rash, facial/tongue/throat swelling, SOB or lightheadedness with hypotension: No Has patient had a PCN reaction causing severe rash involving mucus membranes or skin necrosis: No Has patient had a PCN reaction that required hospitalization: No Has  patient had a PCN reaction occurring within the last 10 years: No If all of the above answers are "NO", then may proceed with Cephalosporin use.  Marland Kitchen Apricot Flavor Hives  . Doxycycline Hives  . Erythromycin Hives  . Milk Of Magnesia [Magnesium Hydroxide] Hives and Itching  . Penicillins Hives and Other (See Comments)    Has patient had a PCN reaction causing immediate rash, facial/tongue/throat swelling, SOB or lightheadedness with hypotension: No Has patient had a PCN reaction causing severe rash involving mucus membranes or skin necrosis: No Has patient had a PCN reaction that required hospitalization: No Has patient had a PCN reaction occurring within the last 10 years: No If all of the above answers are "NO", then may proceed with Cephalosporin use.    Consultations:  GI  Procedures/Studies: Ct Abdomen Pelvis Wo Contrast  Result Date: 11/12/2016 CLINICAL DATA:  Right upper quadrant abdominal pain and nausea. EXAM: CT ABDOMEN AND PELVIS WITHOUT CONTRAST TECHNIQUE: Multidetector CT imaging of the abdomen and pelvis was performed following the standard protocol without IV contrast. COMPARISON:  Right upper quadrant ultrasound from same day. CT abdomen pelvis dated April 21, 2015. FINDINGS: Lower chest: No acute abnormality. Hepatobiliary: No focal liver abnormality is seen. Status post cholecystectomy. No biliary dilatation. Pancreas: Unremarkable. No pancreatic ductal dilatation or surrounding inflammatory changes. Spleen: Normal in size without focal abnormality. Adrenals/Urinary Tract: Adrenal glands are unremarkable. Kidneys are normal, without renal calculi, focal lesion, or hydronephrosis. Bladder is unremarkable. Stomach/Bowel: Mild wall thickening of the ascending and proximal transverse colon. The stomach is within normal limits. The appendix is surgically absent. No bowel obstruction. Vascular/Lymphatic: No significant vascular findings are present. No enlarged abdominal or pelvic  lymph nodes. Reproductive: Uterus and bilateral adnexa are unremarkable. Partially visualized 10 mm low-density lesion along the left perineum, unchanged. Tampon in the vagina. Other: No free fluid or pneumoperitoneum. Musculoskeletal: No acute or significant osseous findings. IMPRESSION: 1. Mild wall thickening of the ascending and proximal transverse colon, consistent with colitis. No bowel obstruction. 2. Unchanged 1.0 cm left Bartholin's gland cyst. Electronically Signed   By: Titus Dubin M.D.   On: 11/12/2016 09:38   US Abdomen Limited Ruq  Result Date: 11/12/2016 CLINICAL DATA:  RIGHT upper quadrant pain. History of pancreatitis. Status post cholecystectomy. EXAM: ULTRASOUND ABDOMEN LIMITED RIGHT UPPER QUADRANT COMPARISON:  Abdominal ultrasound July 27, 2016 FINDINGS: Gallbladder: Status post cholecystectomy. No fluid collections within the gallbladder fossa. Common bile duct: Diameter: 4 mm.  No choledocholithiasis. Liver: No focal lesion identified. Within normal limits in parenchymal echogenicity. Portal vein is patent on color Doppler imaging with normal direction of blood flow towards the liver. IMPRESSION: No  acute RIGHT upper quadrant process.  Status post cholecystectomy. Electronically Signed   By: Elon Alas M.D.   On: 11/12/2016 06:41    EGD 11/15/2016 by Dr. Watt Climes:  - Full liquid diet today. Slowly advance as tolerated - Continue present medications. - Return to GI clinic PRN. Believe patient should have outpatient university workup to make sure she doesn't have anything rare like porphyria etc. and for now she would be best served with may be a [facility] blood test lab to rule out Crohn's anybody's and if that test was positive then we could proceed with a colonoscopy but no need to proceed with that now - Telephone GI clinic if symptomatic PRN. Believe she needs significant psychological evaluation to better deal with her chronic issues and pain issues before any at risk GI  further evaluation   Subjective: Still with intermittent vomiting, but tolerating food, yogurt, in between. Abdominal pain is no worse. No diarrhea or bleeding noted. Lost IV and doesn't want another one. Wants to go home today.   Discharge Exam: Vitals:   11/15/16 1510 11/16/16 0550  BP: 124/75 (!) 105/56  Pulse: 66 71  Resp: 18 18  Temp: 99.4 F (37.4 C) 99.1 F (37.3 C)  SpO2: 100% 100%   Gen: Well-appearing 26 y.o.female in NAD HEENT: MMM, posterior oropharynx clear Pulm: Non-labored; CTAB, no wheezes  CV: Regular rate, no murmur appreciated; distal pulses intact/symmetric GI: + BS; soft, tender in right abdomen without rebound, +voluntary guarding in that area but remains non-distended Skin: No rashes, wounds, ulcers Neuro: A&Ox3, CN II-XII without deficits  Labs: BNP (last 3 results) No results for input(s): BNP in the last 8760 hours. Basic Metabolic Panel:  Recent Labs Lab 11/12/16 0456 11/12/16 1454 11/13/16 0828 11/14/16 1025 11/15/16 0445 11/16/16 0507  NA 138 140 140 140 142 140  K 2.8* 4.0 3.3* 2.8* 3.7 3.1*  CL 107 115* 111 108 112* 108  CO2 21* 16* 21* 22 24 26   GLUCOSE 122* 101* 86 73 98 109*  BUN 6 <5* <5* <5* <5* <5*  CREATININE 0.79 0.56 0.59 0.55 0.50 0.49  CALCIUM 8.5* 7.8* 8.0* 8.4* 8.4* 8.1*  MG 1.8  --   --   --   --  1.6*  PHOS  --  2.2*  --   --   --  3.3   Liver Function Tests:  Recent Labs Lab 11/12/16 0456  AST 27  ALT 17  ALKPHOS 36*  BILITOT 0.5  PROT 6.2*  ALBUMIN 3.7    Recent Labs Lab 11/12/16 0456  LIPASE 15   No results for input(s): AMMONIA in the last 168 hours. CBC:  Recent Labs Lab 11/12/16 0456 11/13/16 0828 11/15/16 0445  WBC 11.5* 6.5 6.7  HGB 14.0 11.4* 11.4*  HCT 40.8 33.4* 34.2*  MCV 88.9 88.6 90.0  PLT 226 212 246   Cardiac Enzymes: No results for input(s): CKTOTAL, CKMB, CKMBINDEX, TROPONINI in the last 168 hours. BNP: Invalid input(s): POCBNP CBG: No results for input(s): GLUCAP in  the last 168 hours. D-Dimer No results for input(s): DDIMER in the last 72 hours. Hgb A1c No results for input(s): HGBA1C in the last 72 hours. Lipid Profile No results for input(s): CHOL, HDL, LDLCALC, TRIG, CHOLHDL, LDLDIRECT in the last 72 hours. Thyroid function studies No results for input(s): TSH, T4TOTAL, T3FREE, THYROIDAB in the last 72 hours.  Invalid input(s): FREET3 Anemia work up No results for input(s): VITAMINB12, FOLATE, FERRITIN, TIBC, IRON, RETICCTPCT in the last  72 hours. Urinalysis    Component Value Date/Time   COLORURINE YELLOW 11/12/2016 0820   APPEARANCEUR CLEAR 11/12/2016 0820   LABSPEC 1.015 11/12/2016 0820   PHURINE 6.0 11/12/2016 0820   GLUCOSEU NEGATIVE 11/12/2016 0820   HGBUR NEGATIVE 11/12/2016 0820   BILIRUBINUR NEGATIVE 11/12/2016 0820   KETONESUR NEGATIVE 11/12/2016 0820   PROTEINUR NEGATIVE 11/12/2016 0820   UROBILINOGEN 1.0 11/04/2014 0946   NITRITE NEGATIVE 11/12/2016 0820   LEUKOCYTESUR NEGATIVE 11/12/2016 0820    Microbiology Recent Results (from the past 240 hour(s))  Blood culture (routine x 2)     Status: None (Preliminary result)   Collection Time: 11/12/16 10:28 AM  Result Value Ref Range Status   Specimen Description BLOOD RIGHT WRIST  Final   Special Requests   Final    BOTTLES DRAWN AEROBIC AND ANAEROBIC Blood Culture adequate volume   Culture   Final    NO GROWTH 3 DAYS Performed at [facility], [address] [place], [place] [address]    Report Status PENDING  Incomplete  Blood culture (routine x 2)     Status: None (Preliminary result)   Collection Time: 11/12/16 10:28 AM  Result Value Ref Range Status   Specimen Description BLOOD LEFT ANTECUBITAL  Final   Special Requests   Final    BOTTLES DRAWN AEROBIC AND ANAEROBIC Blood Culture adequate volume   Culture   Final    NO GROWTH 3 DAYS Performed at [facility], [place] [address] [place], [place] [address]    Report Status PENDING  Incomplete    Time  coordinating discharge: Approximately 40 minutes  Vance Gather, MD  [facility] 11/16/2016, 12:01 PM Pager [phone]

## 2016-11-16 NOTE — Care Management Note (Signed)
Case Management Note  Patient Details  Name: Jasmine [name] MRN: [id number] Date of Birth: Jun 27, 1990  Subjective/Objective:  No further CM needs.                  Action/Plan:d/c home.   Expected Discharge Date:   (unknown)               Expected Discharge Plan:  Home/Self Care  In-House Referral:     Discharge planning Services  CM Consult  Post Acute Care Choice:    Choice offered to:     DME Arranged:    DME Agency:     HH Arranged:    [place] Agency:     Status of Service:  Completed, signed off  If discussed at [employer] of Stay Meetings, dates discussed:    Additional Comments:  Dessa Phi, RN 11/16/2016, 11:46 AM

## 2016-11-16 NOTE — Progress Notes (Signed)
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 She was found to have colitis of the ascending and proximal transverse colon which was treated supportively with improvement. She is tolerating a diet with manageable nausea and pain and wants to go home. GI was consulted, performed an unremarkable EGD, and recommended outpatient follow up.   Assessment & Plan:  Principal Problem:   Colitis Active Problems:   Asthma   Low blood potassium   Seizure ([facility])  Colitis of the ascending and proximal transverse colon: Presumed infectious. With symptoms of nausea and vomiting, also considering IBS (Dx earlier in life) and cannabinoid hyperemesis (no UDS here, but has used marijuana in the past) as well as viral enteritis. No red flags of diarrhea, rectal bleeding, mucus in the stool or weight loss. - Per GI, Dr. Therisa Doyne: "Patient reports she was diagnosed with IBS since the age of 77.She used to live in [place] until recently,she has been evaluated by GI there,however does not remember undergoing a colonoscopy. Patient states Phenergan really helps with nausea,she wants to be on solids. She wants to go home,discussed with patient's hospitalist Dr. Bonner Puna. Recommend completing a course of ciprofloxacin and Flagyl for a total of 7 days,continuing Phenergan as needed every 6 hours for nausea and vomiting. Outpatient workup for colonoscopy." - Continue IV medications including antibiotics given ongoing emesis.  - Continue IV antiemetics  Asthma: No exacerbation/hypoxia - Monitor  Hypokalemia: Resolved with replacement, likely due to emesis and poor per oral intake. -  Recheck metabolic panel in AM  Mild normocytic anemia: Hemoglobin stable around 11.   H/o seizures:  - Continue keppra IV since having emesis.  GERD: Chronic, stable -Resume PPI.   Hypophosphatemia: Replaced.   Leukocytosis: Possibly from colitis. Resolved.   DVT prophylaxis: lovenox.  Code Status: Full Family Communication: None at bedside Disposition Plan: Home pending tolerance of diet.   Consultants:   Gastroenterology  Procedures:  EGD 11/15/2016 by Dr. Watt Climes:  - Full liquid diet today. Slowly advance as tolerated - Continue present medications. - Return to GI clinic PRN. Believe patient should have outpatient university workup to make sure she doesn't have anything rare like porphyria etc. and for now she would be best served with may be a [facility] blood test lab to rule out Crohn's anybody's and if that test was positive then we could proceed with a colonoscopy but no need to proceed with that now - Telephone GI clinic if symptomatic PRN. Believe she needs significant psychological evaluation to better deal with her chronic issues and pain issues before any at risk GI further evaluation   Antimicrobials: Ciprofloxacin/flagyl  Subjective: Had ongoing gradual symptomatic improvement for past 36 hours, tolerating solid diet and was discharged this morning. Unfortunately, after this she began having stomach contents emesis after yogurt, water, sprite despite IV phenergan. I have witnessed these episodes.   Objective: Vitals:   11/15/16 1510 11/16/16 0500 11/16/16 0550 11/16/16 1311  BP: 124/75  (!) 105/56 (!) 181/166  Pulse: 66  71 81  Resp: 18  18 20   Temp: 99.4 F (37.4 C)  99.1 F (37.3 C) 98.8 F (37.1 C)  TempSrc: Oral  Oral Oral  SpO2: 100%  100% 99%  Weight:  70.4 kg (155 lb 3.3 oz)    Height:       Gen: Well-appearing 26 y.o.female retching  HEENT: MMM, posterior oropharynx clear Pulm: Non-labored; CTAB, no wheezes  CV: Regular rate, no murmur  appreciated; distal pulses intact/symmetric GI: +BS; soft, tender in right abdomen without rebound, +voluntary guarding in that area but remains non-distended Skin: No rashes, wounds, ulcers Neuro: A&Ox3, CN II-XII without deficits  CBC:  Recent Labs Lab 11/12/16 0456 11/13/16 0828 11/15/16 0445  WBC 11.5* 6.5 6.7  HGB 14.0 11.4* 11.4*  HCT 40.8 33.4* 34.2*  MCV 88.9 88.6 90.0  PLT 226 212 032   Basic Metabolic Panel:  Recent Labs Lab 11/12/16 0456 11/12/16 1454 11/13/16 0828 11/14/16 1025 11/15/16 0445 11/16/16 0507  NA 138 140 140 140 142 140  K 2.8* 4.0 3.3* 2.8* 3.7 3.1*  CL 107 115* 111 108 112* 108  CO2 21* 16* 21* 22 24 26   GLUCOSE 122* 101* 86 73 98 109*  BUN 6 <5* <5* <5* <5* <5*  CREATININE 0.79 0.56 0.59 0.55 0.50 0.49  CALCIUM 8.5* 7.8* 8.0* 8.4* 8.4* 8.1*  MG 1.8  --   --   --   --  1.6*  PHOS  --  2.2*  --   --   --  3.3   GFR: Estimated Creatinine Clearance: 105.9 mL/min (by C-G formula based on SCr of 0.49 mg/dL). Liver Function Tests:  Recent Labs Lab 11/12/16 0456  AST 27  ALT 17  ALKPHOS 36*  BILITOT 0.5  PROT 6.2*  ALBUMIN 3.7    Recent Labs Lab 11/12/16 0456  LIPASE 15   Sepsis Labs:  Recent Labs Lab 11/12/16 1042  LATICACIDVEN 1.24    Recent Results (from the past 240 hour(s))  Blood culture (routine x 2)     Status: None (Preliminary result)   Collection Time: 11/12/16 10:28 AM  Result Value Ref Range Status   Specimen Description BLOOD RIGHT WRIST  Final   Special Requests   Final    BOTTLES DRAWN AEROBIC AND ANAEROBIC Blood Culture adequate volume   Culture   Final    NO GROWTH 4 DAYS Performed at [facility], [place] [address], [place], [place] [address]    Report Status PENDING  Incomplete  Blood culture (routine x 2)     Status: None (Preliminary result)   Collection Time: 11/12/16 10:28 AM  Result Value Ref Range Status   Specimen Description BLOOD LEFT ANTECUBITAL  Final   Special Requests   Final     BOTTLES DRAWN AEROBIC AND ANAEROBIC Blood Culture adequate volume   Culture   Final    NO GROWTH 4 DAYS Performed at [facility], [place] [address], [place], [place] [address]    Report Status PENDING  Incomplete      LOS: 3 days    Time spent: 25 MINUTES.   Vance Gather, MD [facility] Pager [phone]   If 7PM-7AM, please contact night-coverage [email or web address] Password [email or web address] 11/16/2016, 3:22 PM

## 2016-11-16 NOTE — Progress Notes (Signed)
D/C ordered for today, but per day RN Hospitalist is OK with patient not being D/C'd today after increased in BP and N/V. Pt does not want to be D/C'd today and wants to stay until AM. Will make on-call MD aware and continue to monitor.

## 2016-11-16 NOTE — Progress Notes (Addendum)
Subjective: Was seen and examined at bedside. As I entered the room patient was sleeping and appeared comfortable, when I woke her up and she saw me, she started complaining of abdominal pain and stated that it hurts to even move. She had an episode of vomiting after eating Jell-O last night, as per documentation from nursing staff it seems she was eating pizza and pasta brought in by family members. Patient reports no bowel movement for the last 3 days, but states it's normal for her to have a bowel movement every few days. She has not noted blood in his stool or mucus in her stools at home.   Objective: Vital signs in last 24 hours: Temp:  [98.5 F (36.9 C)-99.4 F (37.4 C)] 99.1 F (37.3 C) (11/02 0550) Pulse Rate:  [66-99] 71 (11/02 0550) Resp:  [17-18] 18 (11/02 0550) BP: (105-124)/(39-76) 105/56 (11/02 0550) SpO2:  [100 %] 100 % (11/02 0550) Weight:  [70.4 kg (155 lb 3.3 oz)] 70.4 kg (155 lb 3.3 oz) (11/02 0500) Weight change: 1 kg (2 lb 3.3 oz) Last BM Date: 11/11/16  PE: Appears to be in distress from abdominal pain GENERAL: No obvious pallor or icterus ABDOMEN: Appears soft, non distended, patient has voluntary guarding on of right abdomen and refused to be examined on the right side due to pain EXTREMITIES: No edema, no deformity   Lab Results: Results for orders placed or performed during the hospital encounter of 11/12/16 (from the past 48 hour(s))  Basic metabolic panel     Status: Abnormal   Collection Time: 11/14/16 10:25 AM  Result Value Ref Range   Sodium 140 135 - 145 mmol/L   Potassium 2.8 (L) 3.5 - 5.1 mmol/L   Chloride 108 101 - 111 mmol/L   CO2 22 22 - 32 mmol/L   Glucose, Bld 73 65 - 99 mg/dL   BUN <5 (L) 6 - 20 mg/dL   Creatinine, Ser 0.55 0.44 - 1.00 mg/dL   Calcium 8.4 (L) 8.9 - 10.3 mg/dL   GFR calc non Af Amer >60 >60 mL/min   GFR calc Af Amer >60 >60 mL/min    Comment: (NOTE) The eGFR has been calculated using the CKD EPI equation. This  calculation has not been validated in all clinical situations. eGFR's persistently <60 mL/min signify possible Chronic Kidney Disease.    Anion gap 10 5 - 15  CBC     Status: Abnormal   Collection Time: 11/15/16  4:45 AM  Result Value Ref Range   WBC 6.7 4.0 - 10.5 K/uL   RBC 3.80 (L) 3.87 - 5.11 MIL/uL   Hemoglobin 11.4 (L) 12.0 - 15.0 g/dL   HCT 34.2 (L) 36.0 - 46.0 %   MCV 90.0 78.0 - 100.0 fL   MCH 30.0 26.0 - 34.0 pg   MCHC 33.3 30.0 - 36.0 g/dL   RDW 14.0 11.5 - 15.5 %   Platelets 246 150 - 400 K/uL  Basic metabolic panel     Status: Abnormal   Collection Time: 11/15/16  4:45 AM  Result Value Ref Range   Sodium 142 135 - 145 mmol/L   Potassium 3.7 3.5 - 5.1 mmol/L    Comment: DELTA CHECK NOTED REPEATED TO VERIFY NO VISIBLE HEMOLYSIS    Chloride 112 (H) 101 - 111 mmol/L   CO2 24 22 - 32 mmol/L   Glucose, Bld 98 65 - 99 mg/dL   BUN <5 (L) 6 - 20 mg/dL   Creatinine, Ser 0.50 0.44 -  1.00 mg/dL   Calcium 8.4 (L) 8.9 - 10.3 mg/dL   GFR calc non Af Amer >60 >60 mL/min   GFR calc Af Amer >60 >60 mL/min    Comment: (NOTE) The eGFR has been calculated using the CKD EPI equation. This calculation has not been validated in all clinical situations. eGFR's persistently <60 mL/min signify possible Chronic Kidney Disease.    Anion gap 6 5 - 15  Basic metabolic panel     Status: Abnormal   Collection Time: 11/16/16  5:07 AM  Result Value Ref Range   Sodium 140 135 - 145 mmol/L   Potassium 3.1 (L) 3.5 - 5.1 mmol/L   Chloride 108 101 - 111 mmol/L   CO2 26 22 - 32 mmol/L   Glucose, Bld 109 (H) 65 - 99 mg/dL   BUN <5 (L) 6 - 20 mg/dL   Creatinine, Ser 0.49 0.44 - 1.00 mg/dL   Calcium 8.1 (L) 8.9 - 10.3 mg/dL   GFR calc non Af Amer >60 >60 mL/min   GFR calc Af Amer >60 >60 mL/min    Comment: (NOTE) The eGFR has been calculated using the CKD EPI equation. This calculation has not been validated in all clinical situations. eGFR's persistently <60 mL/min signify possible Chronic  Kidney Disease.    Anion gap 6 5 - 15  Magnesium     Status: Abnormal   Collection Time: 11/16/16  5:07 AM  Result Value Ref Range   Magnesium 1.6 (L) 1.7 - 2.4 mg/dL  Phosphorus     Status: None   Collection Time: 11/16/16  5:07 AM  Result Value Ref Range   Phosphorus 3.3 2.5 - 4.6 mg/dL    Studies/Results: No results found.  Medications: I have reviewed the patient's current medications.  Assessment: 1.Abdominal pain of unknown etiology  2.Completely normal upper endoscopy performed on 11/15/2016  3. 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 Normal CBC, normal renal function 5. Possible colitis noted on CAT scan, no features of diarrhea, rectal bleeding, mucus in stool, weight loss   Plan: Patient reports she was diagnosed with IBS since the age of 53. She used to live in [place] until recently, she has been evaluated by GI there, however does not remember undergoing a colonoscopy. Patient states Phenergan really helps with nausea, she wants to be on solids. She wants to go home, discussed with patient's hospitalist Dr. Bonner Puna. Recommend completing a course of ciprofloxacin and Flagyl for a total of 7 days, continuing Phenergan as needed every 6 hours for nausea and vomiting. Outpatient workup for colonoscopy.      Ronnette Juniper 11/16/2016, 9:29 AM   Pager [phone] If no answer or after 5 PM call [phone]

## 2016-11-17 LAB — COMPREHENSIVE METABOLIC PANEL
ALK PHOS: 101 U/L (ref 38–126)
ALT: 170 U/L — AB (ref 14–54)
AST: 38 U/L (ref 15–41)
Albumin: 2.9 g/dL — ABNORMAL LOW (ref 3.5–5.0)
Anion gap: 5 (ref 5–15)
BUN: <5 mg/dL — ABNORMAL LOW (ref 6–20)
CALCIUM: 8.4 mg/dL — AB (ref 8.9–10.3)
CO2: 27 mmol/L (ref 22–32)
CREATININE: 0.53 mg/dL (ref 0.44–1.00)
Chloride: 108 mmol/L (ref 101–111)
Glucose, Bld: 98 mg/dL (ref 65–99)
Potassium: 3.3 mmol/L — ABNORMAL LOW (ref 3.5–5.1)
Sodium: 140 mmol/L (ref 135–145)
Total Bilirubin: 0.4 mg/dL (ref 0.3–1.2)
Total Protein: 5.4 g/dL — ABNORMAL LOW (ref 6.5–8.1)

## 2016-11-17 LAB — CULTURE, BLOOD (ROUTINE X 2)
Culture: NO GROWTH
Culture: NO GROWTH
SPECIAL REQUESTS: ADEQUATE
Special Requests: ADEQUATE

## 2016-11-17 LAB — MAGNESIUM: MAGNESIUM: 1.7 mg/dL (ref 1.7–2.4)

## 2016-11-17 MED ORDER — ONDANSETRON HCL 4 MG PO TABS
4.0000 mg | ORAL_TABLET | Freq: Four times a day (QID) | ORAL | Status: DC | PRN
  Administered 2016-11-17: 4 mg via ORAL
  Filled 2016-11-17: qty 1

## 2016-11-17 MED ORDER — OXYCODONE HCL 5 MG PO TABS
10.0000 mg | ORAL_TABLET | ORAL | Status: DC | PRN
  Administered 2016-11-17: 10 mg via ORAL
  Filled 2016-11-17: qty 2

## 2016-11-17 NOTE — Progress Notes (Signed)
PIV access removed d/t patient's continued c/o pain to IV site. IV team unable to start new PIV. Unable to give IVF, IV ABT and IV PRN pain meds. Patient's pain & nausea under control at this time. On-call MD paged to be made aware.

## 2016-11-24 ENCOUNTER — Emergency Department (HOSPITAL_COMMUNITY)
Admission: EM | Admit: 2016-11-24 | Discharge: 2016-11-24 | Disposition: A | Discharge status: Home / Self Care | Payer: Medicaid Other | Attending: Emergency Medicine | Admitting: Emergency Medicine

## 2016-11-24 ENCOUNTER — Encounter (HOSPITAL_COMMUNITY): Payer: Self-pay | Admitting: *Deleted

## 2016-11-24 ENCOUNTER — Emergency Department (HOSPITAL_COMMUNITY): Payer: Medicaid Other

## 2016-11-24 ENCOUNTER — Other Ambulatory Visit: Payer: Self-pay

## 2016-11-24 DIAGNOSIS — J45909 Unspecified asthma, uncomplicated: Secondary | ICD-10-CM | POA: Diagnosis not present

## 2016-11-24 DIAGNOSIS — Z87891 Personal history of nicotine dependence: Secondary | ICD-10-CM | POA: Diagnosis not present

## 2016-11-24 DIAGNOSIS — Z91040 Latex allergy status: Secondary | ICD-10-CM | POA: Diagnosis not present

## 2016-11-24 DIAGNOSIS — Z79899 Other long term (current) drug therapy: Secondary | ICD-10-CM | POA: Insufficient documentation

## 2016-11-24 DIAGNOSIS — R0600 Dyspnea, unspecified: Secondary | ICD-10-CM | POA: Diagnosis not present

## 2016-11-24 DIAGNOSIS — Z91010 Allergy to peanuts: Secondary | ICD-10-CM | POA: Diagnosis not present

## 2016-11-24 DIAGNOSIS — R0602 Shortness of breath: Secondary | ICD-10-CM | POA: Diagnosis not present

## 2016-11-24 LAB — CBC
HCT: 40.8 % (ref 36.0–46.0)
Hemoglobin: 13.4 g/dL (ref 12.0–15.0)
MCH: 30.2 pg (ref 26.0–34.0)
MCHC: 32.8 g/dL (ref 30.0–36.0)
MCV: 92.1 fL (ref 78.0–100.0)
PLATELETS: 330 10*3/uL (ref 150–400)
RBC: 4.43 MIL/uL (ref 3.87–5.11)
RDW: 14.1 % (ref 11.5–15.5)
WBC: 11.2 10*3/uL — AB (ref 4.0–10.5)

## 2016-11-24 LAB — BASIC METABOLIC PANEL
ANION GAP: 9 (ref 5–15)
BUN: 7 mg/dL (ref 6–20)
CALCIUM: 9.2 mg/dL (ref 8.9–10.3)
CO2: 23 mmol/L (ref 22–32)
CREATININE: 0.77 mg/dL (ref 0.44–1.00)
Chloride: 104 mmol/L (ref 101–111)
GFR calc non Af Amer: >60 mL/min (ref >60)
Glucose, Bld: 84 mg/dL (ref 65–99)
Potassium: 4 mmol/L (ref 3.5–5.1)
SODIUM: 136 mmol/L (ref 135–145)

## 2016-11-24 LAB — I-STAT TROPONIN, ED: TROPONIN I, POC: 0 ng/mL (ref 0.00–0.08)

## 2016-11-24 MED ORDER — IPRATROPIUM BROMIDE 0.02 % IN SOLN
0.5000 mg | Freq: Once | RESPIRATORY_TRACT | Status: AC
  Administered 2016-11-24: 0.5 mg via RESPIRATORY_TRACT
  Filled 2016-11-24: qty 2.5

## 2016-11-24 MED ORDER — ALBUTEROL SULFATE (2.5 MG/3ML) 0.083% IN NEBU
5.0000 mg | INHALATION_SOLUTION | Freq: Once | RESPIRATORY_TRACT | Status: AC
  Administered 2016-11-24: 5 mg via RESPIRATORY_TRACT
  Filled 2016-11-24: qty 6

## 2016-11-24 MED ORDER — METHYLPREDNISOLONE SODIUM SUCC 125 MG IJ SOLR
125.0000 mg | Freq: Once | INTRAMUSCULAR | Status: AC
  Administered 2016-11-24: 125 mg via INTRAVENOUS
  Filled 2016-11-24: qty 2

## 2016-11-24 MED ORDER — LORAZEPAM 2 MG/ML IJ SOLN
1.0000 mg | Freq: Once | INTRAMUSCULAR | Status: AC
  Administered 2016-11-24: 1 mg via INTRAVENOUS
  Filled 2016-11-24: qty 1

## 2016-11-24 NOTE — ED Triage Notes (Signed)
Pt reports having a hard time breathing and chest pain.  Pt also reports abd spasms.  Pt states her symptoms began about two days ago.  Pt states her breathing is worse when she walks and talks.  Pt a/o x 4. Lungs sounds clear in triage but is tachypnea

## 2016-11-24 NOTE — ED Provider Notes (Signed)
[facility] DEPT Provider Note   CSN: [id number] Arrival date & time: 11/24/16  1107     History   Chief Complaint Chief Complaint  Patient presents with  . Shortness of Breath  . Chest Pain    HPI Jasmine [name] is a 26 y.o. female.  HPI 26 yo female with a hx of asthma presents complaining of SOB and asthma exacerbation. No fevers or chills. No cough. Reports exertional SOB. No treatment with bronchodilators today. Denies unilateral leg swelling. Symptoms are moderate in severity    Past Medical History:  Diagnosis Date  . Asthma   . Complication of anesthesia    "I wake up during anesthesia" (10/14/2015)  . DVT (deep vein thrombosis) in pregnancy ([place])   . Epilepsy ([place])   . GERD (gastroesophageal reflux disease)   . Heart murmur    last work up age 66- no symtoms  . Pancreatitis   . Seizures ([place])    epilepsy  . Sickle cell trait ([place])   . Sleep apnea    "used to wear CPAP; don't have one here in [place] since I moved in 2014" (10/14/2015)    Patient Active Problem List   Diagnosis Date Noted  . Colitis 11/12/2016  . Seizure ([place]) 11/12/2016  . Calculus of bile duct with acute cholecystitis with obstruction 10/14/2015  . Adjustment disorder with mixed anxiety and depressed mood 04/26/2015  . Low blood potassium 04/19/2015  . Asthma with acute exacerbation 04/19/2015  . Asthma 01/03/2015  . Asthma exacerbation 01/03/2015  . Influenza-like illness 01/03/2015  . Status post cesarean section 11/08/2014  . Previous cesarean section complicating pregnancy, antepartum condition or complication   . GERD (gastroesophageal reflux disease)   . Gastroesophageal reflux disease without esophagitis   . Abnormal biochemical finding on antenatal screening of mother   . Abnormal quad screen   . Echogenic focus of heart of fetus affecting antepartum care of mother   . Drug use affecting pregnancy, antepartum 05/19/2014  . Seizure disorder  during pregnancy, antepartum ([place]) 05/13/2014  . Supervision of high risk pregnancy, antepartum 05/13/2014  . Asthma complicating pregnancy, antepartum 05/13/2014  . History of food anaphylaxis 05/13/2014  . Seizure disorder, sept 2015 last seizure 01/19/2013    Past Surgical History:  Procedure Laterality Date  . APPENDECTOMY    . CESAREAN SECTION  2013  . INGUINAL HERNIA REPAIR Bilateral ~ 1996  . TONSILLECTOMY      OB History    Gravida Para Term Preterm AB Living   3 3 3     3    SAB TAB Ectopic Multiple Live Births         0 3       Home Medications    Prior to Admission medications   Medication Sig Start Date End Date Taking? Authorizing Provider  albuterol (PROVENTIL HFA;VENTOLIN HFA) 108 (90 Base) MCG/ACT inhaler Inhale 2 puffs into the lungs every 4 (four) hours as needed for wheezing or shortness of breath. 09/22/15   Langeland, Leda Quail, MD  albuterol (PROVENTIL) (2.5 MG/3ML) 0.083% nebulizer solution Take 3 mLs (2.5 mg total) by nebulization every 4 (four) hours as needed for wheezing or shortness of breath. 09/22/15   Maren Reamer, MD  ciprofloxacin (CIPRO) 500 MG tablet Take 1 tablet (500 mg total) by mouth 2 (two) times daily. 11/16/16 11/26/16  Patrecia Pour, MD  ibuprofen (ADVIL,MOTRIN) 200 MG tablet Take 200-400 mg by mouth every 6 (six) hours as needed for moderate  pain.    [provider]  levETIRAcetam (KEPPRA) 500 MG tablet Take 1 tablet (500 mg total) by mouth 2 (two) times daily. 09/22/15   Langeland, Leda Quail, MD  ondansetron (ZOFRAN ODT) 4 MG disintegrating tablet Take 1 tablet (4 mg total) by mouth every 8 (eight) hours as needed for nausea or vomiting. Patient not taking: Reported on 11/12/2016 07/27/16   Joy, Raquel Sarna C, PA-C  oxyCODONE (ROXICODONE) 5 MG immediate release tablet Take 1 tablet (5 mg total) by mouth every 4 (four) hours as needed for severe pain. Patient not taking: Reported on 11/12/2016 06/23/16   Nona Dell, PA-C    pantoprazole (PROTONIX) 20 MG tablet Take 1 tablet (20 mg total) by mouth daily. Patient not taking: Reported on 11/12/2016 07/27/16   Lorayne Bender, PA-C  promethazine (PHENERGAN) 12.5 MG tablet Take 1 tablet (12.5 mg total) by mouth every 6 (six) hours as needed for nausea or vomiting. 11/16/16   Patrecia Pour, MD    Family History Family History  Problem Relation Age of Onset  . Cancer Mother        cervical cancer  . Asthma Mother   . Hypertension Father   . Sickle cell anemia Father   . Asthma Sister   . Diabetes Maternal Aunt   . Cancer Maternal Grandmother     Social History Social History   Tobacco Use  . Smoking status: Former Smoker    Packs/day: 0.25    Years: 4.00    Pack years: 1.00    Types: Cigarettes    Last attempt to quit: 10/02/2015    Years since quitting: 1.1  . Smokeless tobacco: Never Used  Substance Use Topics  . Alcohol use: No    Alcohol/week: 0.0 oz    Frequency: Never    Comment: 10/14/2015 "might have a few drinks/year; big parties, holidays"  . Drug use: No     Allergies   Cheese; Chocolate; Ibuprofen; Ivp dye [iodinated diagnostic agents]; Latex; Orange juice [orange oil]; Other; Peach [prunus persica]; Peanuts [peanut oil]; Pear; Prednisone; Raspberry; Tylenol [acetaminophen]; Amoxicillin; Apricot flavor; Doxycycline; Erythromycin; Milk of magnesia [magnesium hydroxide]; and Penicillins   Review of Systems Review of Systems  All other systems reviewed and are negative.    Physical Exam Updated Vital Signs BP 136/89 (BP Location: Right Arm)   Pulse 97   Temp 98.6 F (37 C) (Oral)   Resp (!) 30   Ht 5\' 5"  (1.651 m)   Wt 70.3 kg (155 lb)   LMP 11/08/2016   SpO2 100%   BMI 25.79 kg/m   Physical Exam  Constitutional: She is oriented to person, place, and time. She appears well-developed and well-nourished. No distress.  HENT:  Head: Normocephalic and atraumatic.  Eyes: EOM are normal.  Neck: Normal range of motion.   Cardiovascular: Normal rate, regular rhythm and normal heart sounds.  Pulmonary/Chest:  Tachypnea, decreased air movement through bases. No overt wheezing at this time  Abdominal: Soft. She exhibits no distension. There is no tenderness.  Musculoskeletal: Normal range of motion.  Neurological: She is alert and oriented to person, place, and time.  Skin: Skin is warm and dry.  Psychiatric: She has a normal mood and affect. Judgment normal.  Nursing note and vitals reviewed.    ED Treatments / Results  Labs (all labs ordered are listed, but only abnormal results are displayed) Labs Reviewed  CBC - Abnormal; Notable for the following components:      Result  Value   WBC 11.2 (*)    All other components within normal limits  BASIC METABOLIC PANEL  I-STAT TROPONIN, ED    EKG  EKG Interpretation  Date/Time:  Saturday November 24 2016 12:59:21 EST Ventricular Rate:  102 PR Interval:    QRS Duration: 82 QT Interval:  348 QTC Calculation: 454 R Axis:   73 Text Interpretation:  Sinus tachycardia Baseline wander in lead(s) V5 No significant change was found Confirmed by Jola Schmidt [phone]) on 11/24/2016 3:28:03 PM       Radiology Dg Chest 2 View  Result Date: 11/24/2016 CLINICAL DATA:  Shortness of Breath EXAM: CHEST  2 VIEW COMPARISON:  April 19, 2016 FINDINGS: The lungs are clear. The heart size and pulmonary vascularity are normal. No adenopathy. No bone lesions. IMPRESSION: No edema or consolidation. Electronically Signed   By: Lowella Grip III M.D.   On: 11/24/2016 13:23    Procedures Procedures (including critical care time)  Medications Ordered in ED Medications  albuterol (PROVENTIL) (2.5 MG/3ML) 0.083% nebulizer solution 5 mg (5 mg Nebulization Given 11/24/16 1536)  ipratropium (ATROVENT) nebulizer solution 0.5 mg (0.5 mg Nebulization Given 11/24/16 1537)  methylPREDNISolone sodium succinate (SOLU-MEDROL) 125 mg/2 mL injection 125 mg (125 mg Intravenous Given  11/24/16 1458)  LORazepam (ATIVAN) injection 1 mg (1 mg Intravenous Given 11/24/16 1458)     Initial Impression / Assessment and Plan / ED Course  I have reviewed the triage vital signs and the nursing notes.  Pertinent labs & imaging results that were available during my care of the patient were reviewed by me and considered in my medical decision making (see chart for details).     Possible asthma exacerbation. Bronchodilators now. Steroids now. cxr without PNA or other abnormalities. Hx of sickle cell trait. Care to Dr Wilson Singer to follow up on symptoms  Final Clinical Impressions(s) / ED Diagnoses   Final diagnoses:  None    ED Discharge Orders    None       Jola Schmidt, MD 11/24/16 [phone]

## 2017-01-03 ENCOUNTER — Inpatient Hospital Stay (HOSPITAL_COMMUNITY)
Admission: EM | Admit: 2017-01-03 | Discharge: 2017-01-06 | DRG: 202 | Disposition: A | Discharge status: Home / Self Care | Payer: Medicaid Other | Attending: Internal Medicine | Admitting: Internal Medicine

## 2017-01-03 ENCOUNTER — Emergency Department (HOSPITAL_COMMUNITY): Payer: Medicaid Other

## 2017-01-03 ENCOUNTER — Other Ambulatory Visit: Payer: Self-pay

## 2017-01-03 ENCOUNTER — Encounter (HOSPITAL_COMMUNITY): Payer: Self-pay | Admitting: Nurse Practitioner

## 2017-01-03 DIAGNOSIS — Z886 Allergy status to analgesic agent status: Secondary | ICD-10-CM | POA: Diagnosis not present

## 2017-01-03 DIAGNOSIS — J45901 Unspecified asthma with (acute) exacerbation: Secondary | ICD-10-CM | POA: Diagnosis not present

## 2017-01-03 DIAGNOSIS — Z91041 Radiographic dye allergy status: Secondary | ICD-10-CM | POA: Diagnosis not present

## 2017-01-03 DIAGNOSIS — G4733 Obstructive sleep apnea (adult) (pediatric): Secondary | ICD-10-CM | POA: Diagnosis present

## 2017-01-03 DIAGNOSIS — Z91018 Allergy to other foods: Secondary | ICD-10-CM

## 2017-01-03 DIAGNOSIS — K219 Gastro-esophageal reflux disease without esophagitis: Secondary | ICD-10-CM | POA: Diagnosis present

## 2017-01-03 DIAGNOSIS — J4541 Moderate persistent asthma with (acute) exacerbation: Principal | ICD-10-CM | POA: Diagnosis present

## 2017-01-03 DIAGNOSIS — Z79899 Other long term (current) drug therapy: Secondary | ICD-10-CM | POA: Diagnosis not present

## 2017-01-03 DIAGNOSIS — Z975 Presence of (intrauterine) contraceptive device: Secondary | ICD-10-CM

## 2017-01-03 DIAGNOSIS — Z825 Family history of asthma and other chronic lower respiratory diseases: Secondary | ICD-10-CM

## 2017-01-03 DIAGNOSIS — Z880 Allergy status to penicillin: Secondary | ICD-10-CM | POA: Diagnosis not present

## 2017-01-03 DIAGNOSIS — Z7951 Long term (current) use of inhaled steroids: Secondary | ICD-10-CM | POA: Diagnosis not present

## 2017-01-03 DIAGNOSIS — Z888 Allergy status to other drugs, medicaments and biological substances status: Secondary | ICD-10-CM | POA: Diagnosis not present

## 2017-01-03 DIAGNOSIS — G40909 Epilepsy, unspecified, not intractable, without status epilepticus: Secondary | ICD-10-CM | POA: Diagnosis present

## 2017-01-03 DIAGNOSIS — G43909 Migraine, unspecified, not intractable, without status migrainosus: Secondary | ICD-10-CM | POA: Diagnosis present

## 2017-01-03 DIAGNOSIS — R0602 Shortness of breath: Secondary | ICD-10-CM | POA: Diagnosis not present

## 2017-01-03 DIAGNOSIS — D573 Sickle-cell trait: Secondary | ICD-10-CM | POA: Diagnosis present

## 2017-01-03 DIAGNOSIS — Z91040 Latex allergy status: Secondary | ICD-10-CM

## 2017-01-03 DIAGNOSIS — F17200 Nicotine dependence, unspecified, uncomplicated: Secondary | ICD-10-CM | POA: Diagnosis present

## 2017-01-03 DIAGNOSIS — R569 Unspecified convulsions: Secondary | ICD-10-CM | POA: Diagnosis not present

## 2017-01-03 DIAGNOSIS — Z91010 Allergy to peanuts: Secondary | ICD-10-CM

## 2017-01-03 DIAGNOSIS — J9601 Acute respiratory failure with hypoxia: Secondary | ICD-10-CM | POA: Diagnosis present

## 2017-01-03 LAB — RAPID URINE DRUG SCREEN, HOSP PERFORMED
AMPHETAMINES: NOT DETECTED
BENZODIAZEPINES: NOT DETECTED
Barbiturates: NOT DETECTED
COCAINE: NOT DETECTED
OPIATES: NOT DETECTED
TETRAHYDROCANNABINOL: POSITIVE — AB

## 2017-01-03 LAB — BLOOD GAS, VENOUS
Acid-base deficit: 1.1 mmol/L (ref 0.0–2.0)
Bicarbonate: 27.1 mmol/L (ref 20.0–28.0)
DELIVERY SYSTEMS: POSITIVE
FIO2: 50
MODE: POSITIVE
O2 Saturation: 63 %
Patient temperature: 97
pCO2, Ven: 63.7 mmHg — ABNORMAL HIGH (ref 44.0–60.0)
pH, Ven: 7.246 — ABNORMAL LOW (ref 7.250–7.430)
pO2, Ven: 35.1 mmHg (ref 32.0–45.0)

## 2017-01-03 LAB — BASIC METABOLIC PANEL
Anion gap: 4 — ABNORMAL LOW (ref 5–15)
BUN: 14 mg/dL (ref 6–20)
CALCIUM: 8.5 mg/dL — AB (ref 8.9–10.3)
CHLORIDE: 109 mmol/L (ref 101–111)
CO2: 25 mmol/L (ref 22–32)
CREATININE: 0.73 mg/dL (ref 0.44–1.00)
GFR calc Af Amer: >60 mL/min (ref >60)
GFR calc non Af Amer: >60 mL/min (ref >60)
GLUCOSE: 91 mg/dL (ref 65–99)
Potassium: 3.4 mmol/L — ABNORMAL LOW (ref 3.5–5.1)
Sodium: 138 mmol/L (ref 135–145)

## 2017-01-03 LAB — BLOOD GAS, ARTERIAL
Acid-base deficit: 1.8 mmol/L (ref 0.0–2.0)
Bicarbonate: 23 mmol/L (ref 20.0–28.0)
DRAWN BY: 232811
Delivery systems: POSITIVE
EXPIRATORY PAP: 5
FIO2: 50
INSPIRATORY PAP: 15
O2 SAT: 99.4 %
PATIENT TEMPERATURE: 98.6
PH ART: 7.357 (ref 7.350–7.450)
pCO2 arterial: 42.1 mmHg (ref 32.0–48.0)
pO2, Arterial: 270 mmHg — ABNORMAL HIGH (ref 83.0–108.0)

## 2017-01-03 LAB — CBC WITH DIFFERENTIAL/PLATELET
BASOS PCT: 0 %
Basophils Absolute: 0 10*3/uL (ref 0.0–0.1)
EOS PCT: 2 %
Eosinophils Absolute: 0.4 10*3/uL (ref 0.0–0.7)
HEMATOCRIT: 34.2 % — AB (ref 36.0–46.0)
HEMOGLOBIN: 11.4 g/dL — AB (ref 12.0–15.0)
LYMPHS PCT: 38 %
Lymphs Abs: 7.7 10*3/uL — ABNORMAL HIGH (ref 0.7–4.0)
MCH: 30.6 pg (ref 26.0–34.0)
MCHC: 33.3 g/dL (ref 30.0–36.0)
MCV: 91.7 fL (ref 78.0–100.0)
MONOS PCT: 6 %
Monocytes Absolute: 1.2 10*3/uL — ABNORMAL HIGH (ref 0.1–1.0)
NEUTROS ABS: 11 10*3/uL — AB (ref 1.7–7.7)
NEUTROS PCT: 54 %
Platelets: 260 10*3/uL (ref 150–400)
RBC: 3.73 MIL/uL — ABNORMAL LOW (ref 3.87–5.11)
RDW: 14.7 % (ref 11.5–15.5)
WBC: 20.3 10*3/uL — ABNORMAL HIGH (ref 4.0–10.5)

## 2017-01-03 LAB — MRSA PCR SCREENING: MRSA by PCR: NEGATIVE

## 2017-01-03 MED ORDER — ORAL CARE MOUTH RINSE
15.0000 mL | Freq: Two times a day (BID) | OROMUCOSAL | Status: DC
  Administered 2017-01-04 – 2017-01-06 (×3): 15 mL via OROMUCOSAL

## 2017-01-03 MED ORDER — IPRATROPIUM-ALBUTEROL 0.5-2.5 (3) MG/3ML IN SOLN
3.0000 mL | Freq: Four times a day (QID) | RESPIRATORY_TRACT | Status: DC
  Administered 2017-01-03 – 2017-01-05 (×7): 3 mL via RESPIRATORY_TRACT
  Filled 2017-01-03 (×8): qty 3

## 2017-01-03 MED ORDER — SODIUM CHLORIDE 0.9 % IV SOLN
INTRAVENOUS | Status: DC
  Administered 2017-01-03: 12:00:00 via INTRAVENOUS

## 2017-01-03 MED ORDER — LEVETIRACETAM 500 MG PO TABS
500.0000 mg | ORAL_TABLET | Freq: Two times a day (BID) | ORAL | Status: DC
  Administered 2017-01-03 – 2017-01-06 (×7): 500 mg via ORAL
  Filled 2017-01-03 (×7): qty 1

## 2017-01-03 MED ORDER — DEXAMETHASONE SODIUM PHOSPHATE 10 MG/ML IJ SOLN
10.0000 mg | Freq: Once | INTRAMUSCULAR | Status: AC
  Administered 2017-01-03: 10 mg via INTRAVENOUS
  Filled 2017-01-03: qty 1

## 2017-01-03 MED ORDER — METHYLPREDNISOLONE SODIUM SUCC 40 MG IJ SOLR
40.0000 mg | Freq: Two times a day (BID) | INTRAMUSCULAR | Status: DC
  Administered 2017-01-03 – 2017-01-06 (×7): 40 mg via INTRAVENOUS
  Filled 2017-01-03 (×7): qty 1

## 2017-01-03 MED ORDER — HEPARIN SODIUM (PORCINE) 5000 UNIT/ML IJ SOLN
5000.0000 [IU] | Freq: Three times a day (TID) | INTRAMUSCULAR | Status: DC
  Administered 2017-01-03 – 2017-01-06 (×8): 5000 [IU] via SUBCUTANEOUS
  Filled 2017-01-03 (×8): qty 1

## 2017-01-03 MED ORDER — BUDESONIDE 0.5 MG/2ML IN SUSP
0.5000 mg | Freq: Two times a day (BID) | RESPIRATORY_TRACT | Status: DC
  Administered 2017-01-03 – 2017-01-06 (×7): 0.5 mg via RESPIRATORY_TRACT
  Filled 2017-01-03 (×7): qty 2

## 2017-01-03 MED ORDER — DIPHENHYDRAMINE HCL 25 MG PO CAPS: 25.0000 mg | ORAL_CAPSULE | Freq: Three times a day (TID) | ORAL | Status: DC | PRN

## 2017-01-03 MED ORDER — CHLORHEXIDINE GLUCONATE 0.12 % MT SOLN
15.0000 mL | Freq: Two times a day (BID) | OROMUCOSAL | Status: DC
  Administered 2017-01-03 – 2017-01-06 (×6): 15 mL via OROMUCOSAL
  Filled 2017-01-03 (×5): qty 15

## 2017-01-03 MED ORDER — PANTOPRAZOLE SODIUM 40 MG PO TBEC
40.0000 mg | DELAYED_RELEASE_TABLET | Freq: Every day | ORAL | Status: DC
  Administered 2017-01-03 – 2017-01-06 (×4): 40 mg via ORAL
  Filled 2017-01-03 (×4): qty 1

## 2017-01-03 MED ORDER — MORPHINE SULFATE (PF) 4 MG/ML IV SOLN
2.0000 mg | INTRAVENOUS | Status: DC | PRN
  Administered 2017-01-03 – 2017-01-04 (×4): 2 mg via INTRAVENOUS
  Filled 2017-01-03 (×4): qty 1

## 2017-01-03 MED ORDER — ALBUTEROL SULFATE (2.5 MG/3ML) 0.083% IN NEBU: 2.5000 mg | INHALATION_SOLUTION | RESPIRATORY_TRACT | Status: DC | PRN

## 2017-01-03 MED ORDER — ALBUTEROL (5 MG/ML) CONTINUOUS INHALATION SOLN: 10.0000 mg/h | INHALATION_SOLUTION | RESPIRATORY_TRACT | Status: DC

## 2017-01-03 MED ORDER — ALBUTEROL SULFATE (2.5 MG/3ML) 0.083% IN NEBU
5.0000 mg | INHALATION_SOLUTION | Freq: Once | RESPIRATORY_TRACT | Status: AC
  Administered 2017-01-03: 5 mg via RESPIRATORY_TRACT
  Filled 2017-01-03: qty 6

## 2017-01-03 MED ORDER — ALBUTEROL (5 MG/ML) CONTINUOUS INHALATION SOLN
10.0000 mg/h | INHALATION_SOLUTION | RESPIRATORY_TRACT | Status: DC
Start: 2017-01-03 — End: 2017-01-03
  Administered 2017-01-03: 10 mg/h via RESPIRATORY_TRACT
  Filled 2017-01-03: qty 20

## 2017-01-03 MED ORDER — SODIUM CHLORIDE 0.9 % IV SOLN: 250.0000 mL | INTRAVENOUS | Status: DC | PRN

## 2017-01-03 MED ORDER — LIP MEDEX EX OINT
TOPICAL_OINTMENT | CUTANEOUS | Status: AC
  Filled 2017-01-03: qty 7

## 2017-01-03 MED ORDER — ARFORMOTEROL TARTRATE 15 MCG/2ML IN NEBU
15.0000 ug | INHALATION_SOLUTION | Freq: Two times a day (BID) | RESPIRATORY_TRACT | Status: DC
  Administered 2017-01-03 – 2017-01-06 (×7): 15 ug via RESPIRATORY_TRACT
  Filled 2017-01-03 (×6): qty 2

## 2017-01-03 NOTE — H&P (Signed)
Name: Jasmine [name] MRN: [id number] DOB: 1990/11/06    ADMISSION DATE:  01/03/2017 CONSULTATION DATE:  01/03/17   REFERRING MD :  Dr. Florina Ou   CHIEF COMPLAINT:  Acute Asthma Exacerbation   HISTORY OF PRESENT ILLNESS:  26 y/o F, smoker, who presented to [facility] on 12/20 with increased cough, wheezing, chest tightness and shortness of breath.    The patient has a history of asthma (has required intubation in the past), reported sleep apnea, seizures and GERD.  She reports she has been feeling poorly for 3 days.  Her children have been sick.  She denies smoking.  States she has been using her asthma medications.  She denies fevers, chills, nausea, vomiting, diarrhea.  She is unable to provide much history as she is on BiPAP.  Initial ER records note that she initially presented with tachypnea, tripoding and accessory muscle use.  She was unable to speak on arrival.  The patient was treated with albuterol and was placed on BiPAP therapy.  She made gradual improvement on BiPAP.  In addition, she was treated with magnesium & decadron.    VBG 7.246 / 63.  Follow up ABG 7.357 / 42 / 270 / 23.  Labs - Na 138, K 3.4, Cl 109, CO2 25, glucose 91, BUN 14, Sr Cr 0.73, WBC 20.3, hgb 11.4 and platelets 260.  CXR was negative.   PCCM consulted for evaluation.   Chart review shows she is frequently seen in the ER for the same.  She was last seen by Dr. Lamonte Sakai in 2016 during pregnancy.   PAST MEDICAL HISTORY :   has a past medical history of Asthma, Complication of anesthesia, DVT (deep vein thrombosis) in pregnancy ([place]), Epilepsy ([place]), GERD (gastroesophageal reflux disease), Heart murmur, Pancreatitis, Seizures ([place]), Sickle cell trait ([place]), and Sleep apnea.   has a past surgical history that includes Nerve, tendon and artery repair (Right, 09/23/2012); Tonsillectomy; Cesarean section (2013); Appendectomy; Cesarean section (N/A, 11/08/2014); Inguinal hernia repair (Bilateral, ~ 1996); Cholecystectomy (N/A,  10/16/2015); and Esophagogastroduodenoscopy (egd) with propofol (N/A, 11/15/2016).  Prior to Admission medications   Medication Sig Start Date End Date Taking? Authorizing Provider  acetaminophen (TYLENOL) 325 MG tablet Take 325 mg by mouth every 6 (six) hours as needed for moderate pain.   Yes [provider]  albuterol (PROVENTIL HFA;VENTOLIN HFA) 108 (90 Base) MCG/ACT inhaler Inhale 2 puffs into the lungs every 4 (four) hours as needed for wheezing or shortness of breath. 09/22/15  Yes Langeland, Dawn T, MD  albuterol (PROVENTIL) (2.5 MG/3ML) 0.083% nebulizer solution Take 3 mLs (2.5 mg total) by nebulization every 4 (four) hours as needed for wheezing or shortness of breath. 09/22/15  Yes Langeland, Dawn T, MD  dornase alpha (PULMOZYME) 1 MG/ML nebulizer solution Inhale 1.25 mg into the lungs 2 (two) times daily.    Yes [provider]  etonogestrel (NEXPLANON) 68 MG IMPL implant 1 each once by Subdermal route.   Yes [provider]  fluticasone-salmeterol (ADVAIR HFA) 115-21 MCG/ACT inhaler Inhale 2 puffs into the lungs 2 (two) times daily.    Yes [provider]  levETIRAcetam (KEPPRA) 500 MG tablet Take 1 tablet (500 mg total) by mouth 2 (two) times daily. 09/22/15  Yes Langeland, Dawn T, MD  oxyCODONE (ROXICODONE) 5 MG immediate release tablet Take 1 tablet (5 mg total) by mouth every 4 (four) hours as needed for severe pain. 06/23/16  Yes Nona Dell, PA-C  ondansetron (ZOFRAN ODT) 4 MG disintegrating tablet  Take 1 tablet (4 mg total) by mouth every 8 (eight) hours as needed for nausea or vomiting. Patient not taking: Reported on 01/03/2017 07/27/16   Joy, Raquel Sarna C, PA-C  pantoprazole (PROTONIX) 20 MG tablet Take 1 tablet (20 mg total) by mouth daily. Patient not taking: Reported on 11/12/2016 07/27/16   Lorayne Bender, PA-C  promethazine (PHENERGAN) 12.5 MG tablet Take 1 tablet (12.5 mg total) by mouth every 6 (six) hours as needed for nausea or  vomiting. Patient not taking: Reported on 01/03/2017 11/16/16   Patrecia Pour, MD    Allergies  Allergen Reactions  . Cheese Shortness Of Breath  . Chocolate Shortness Of Breath  . Ibuprofen Hives and Shortness Of Breath    Children's ibuprofen  . Ivp Dye [Iodinated Diagnostic Agents] Shortness Of Breath  . Latex Anaphylaxis, Swelling and Other (See Comments)    Reaction:  Localized swelling   . Orange Juice [Orange Oil] Shortness Of Breath  . Other Hives and Other (See Comments)    Pt states that she is allergic to all steroids except IV solu-medrol.    Marland Kitchen Peach [Prunus Persica] Anaphylaxis  . Peanuts [Peanut Oil] Anaphylaxis  . Pear Anaphylaxis  . Prednisone Hives  . Raspberry Anaphylaxis  . Tylenol [Acetaminophen] Hives, Shortness Of Breath and Other (See Comments)    Pt states that this is only with the liquid form.    . Amoxicillin Hives and Other (See Comments)    Has patient had a PCN reaction causing immediate rash, facial/tongue/throat swelling, SOB or lightheadedness with hypotension: No Has patient had a PCN reaction causing severe rash involving mucus membranes or skin necrosis: No Has patient had a PCN reaction that required hospitalization: No Has patient had a PCN reaction occurring within the last 10 years: No If all of the above answers are "NO", then may proceed with Cephalosporin use.  Marland Kitchen Apricot Flavor Hives  . Doxycycline Hives  . Erythromycin Hives  . Milk Of Magnesia [Magnesium Hydroxide] Hives and Itching  . Penicillins Hives and Other (See Comments)    Has patient had a PCN reaction causing immediate rash, facial/tongue/throat swelling, SOB or lightheadedness with hypotension: No Has patient had a PCN reaction causing severe rash involving mucus membranes or skin necrosis: No Has patient had a PCN reaction that required hospitalization: No Has patient had a PCN reaction occurring within the last 10 years: No If all of the above answers are "NO", then may  proceed with Cephalosporin use.    FAMILY HISTORY:  family history includes Asthma in her mother and sister; Cancer in her maternal grandmother and mother; Diabetes in her maternal aunt; Hypertension in her father; Sickle cell anemia in her father.  SOCIAL HISTORY:  reports that she quit smoking about 15 months ago. Her smoking use included cigarettes. She has a 1.00 pack-year smoking history. she has never used smokeless tobacco. She reports that she does not drink alcohol or use drugs.  REVIEW OF SYSTEMS:  POSITIVES IN BOLD  Constitutional: Negative for fever, chills, weight loss, malaise/fatigue and diaphoresis.  HENT: Negative for hearing loss, ear pain, nosebleeds, congestion, sore throat, neck pain, tinnitus and ear discharge.   Eyes: Negative for blurred vision, double vision, photophobia, pain, discharge and redness.  Respiratory: Negative for cough, hemoptysis, sputum production, shortness of breath, wheezing and stridor.   Cardiovascular: Negative for chest pain, palpitations, orthopnea, claudication, leg swelling and PND.  Gastrointestinal: Negative for heartburn, nausea, vomiting, abdominal pain, diarrhea, constipation, blood in stool and  melena.  Genitourinary: Negative for dysuria, urgency, frequency, hematuria and flank pain.  Musculoskeletal: Negative for myalgias, back pain, joint pain and falls.  Skin: Negative for itching and rash.  Neurological: Negative for dizziness, tingling, tremors, sensory change, speech change, focal weakness, seizures, loss of consciousness, weakness and headaches.  Endo/Heme/Allergies: Negative for environmental allergies and polydipsia. Does not bruise/bleed easily.  SUBJECTIVE:   VITAL SIGNS: Pulse Rate:  [98-137] 98 (12/20 0830) Resp:  [9-39] 18 (12/20 0830) BP: (134-167)/(68-147) 149/74 (12/20 0830) SpO2:  [100 %] 100 % (12/20 0830) FiO2 (%):  [40 %-50 %] 40 % (12/20 0833)  PHYSICAL EXAMINATION: General: young adult female in NAD lying  on ER stretcher  HEENT: MM pink/moist, no jvd, BiPAP mask in place  PSY: calm/appropriate  Neuro: AAOx4, MAE, normal strength  CV: s1s2 rrr, no m/r/g PULM: even/non-labored, lungs bilaterally with good air movement, wheezing on the left  SE:GBTD, non-tender, bsx4 active  Extremities: warm/dry, no edema  Skin: no rashes or lesions  Recent Labs  Lab 01/03/17 0416  NA 138  K 3.4*  CL 109  CO2 25  BUN 14  CREATININE 0.73  GLUCOSE 91    Recent Labs  Lab 01/03/17 0416  HGB 11.4*  HCT 34.2*  WBC 20.3*  PLT 260    Dg Chest Port 1 View  Result Date: 01/03/2017 CLINICAL DATA:  Shortness of breath EXAM: PORTABLE CHEST 1 VIEW COMPARISON:  Chest radiograph 11/24/2016 FINDINGS: The heart size and mediastinal contours are within normal limits. Both lungs are clear. The visualized skeletal structures are unremarkable. IMPRESSION: No active disease. Electronically Signed   By: Ulyses Jarred M.D.   On: 01/03/2017 04:37      SIGNIFICANT EVENTS  12/20  Admit with acute asthma exacerbation   STUDIES:   CULTURES RVP 12/20 >>     ASSESSMENT / PLAN:  Discussion: 26 y/o F with PMH of asthma, reported OSA (not on CPAP) admitted 12/20 with an acute asthma exacerbation.    Acute Asthma Exacerbation  P: Cycle on / off BiPAP for next 24 hours as needed for increased WOB  Duoneb Q6 for the next 24 hours then change to PRN if improved wheezes PRN albuterol  Brovana + Pulmicort BID (in place of outpatient Advair) Solumedrol 40 mg BID, consider reduction in am to QD (note pt tolerated decadron in ER) Pulmonary hygiene  Follow up with outpatient pulmonary at discharge  Assess UDS Assess RVP, droplet precautions Follow intermittent CXR  Seizure Disorder  P:  Continue Keppra BID     Best Practice  GI: PPI in setting of steroids, asthma  Diet: NPO until off BiPAP  DVT:  Heparin SQ    Global - Monitor on SDU.  Transfer to [facility] as of am 12/21.    Noe Gens, NP-C [place]  Pulmonary & Critical Care Pgr: [phone] or if no answer [phone] 01/03/2017, 10:50 AM

## 2017-01-03 NOTE — ED Provider Notes (Signed)
[facility] DEPT Provider Note   CSN: [id number] Arrival date & time: 01/03/17  0353     History   Chief Complaint Chief Complaint  Patient presents with  . Asthma    HPI Jasmine [name] is a 26 y.o. female.  Patient with past medical history remarkable for asthma presents to the emergency department in respiratory distress.  She is unable to assist with her history.  Level 5 caveat applies secondary to acuity of condition.   The history is provided by the patient. No language interpreter was used.    Past Medical History:  Diagnosis Date  . Asthma   . Complication of anesthesia    "I wake up during anesthesia" (10/14/2015)  . DVT (deep vein thrombosis) in pregnancy ([place])   . Epilepsy ([place])   . GERD (gastroesophageal reflux disease)   . Heart murmur    last work up age 40- no symtoms  . Pancreatitis   . Seizures ([place])    epilepsy  . Sickle cell trait ([place])   . Sleep apnea    "used to wear CPAP; don't have one here in [place] since I moved in 2014" (10/14/2015)    Patient Active Problem List   Diagnosis Date Noted  . Colitis 11/12/2016  . Seizure ([place]) 11/12/2016  . Calculus of bile duct with acute cholecystitis with obstruction 10/14/2015  . Adjustment disorder with mixed anxiety and depressed mood 04/26/2015  . Low blood potassium 04/19/2015  . Asthma with acute exacerbation 04/19/2015  . Asthma 01/03/2015  . Asthma exacerbation 01/03/2015  . Influenza-like illness 01/03/2015  . Status post cesarean section 11/08/2014  . Previous cesarean section complicating pregnancy, antepartum condition or complication   . GERD (gastroesophageal reflux disease)   . Gastroesophageal reflux disease without esophagitis   . Abnormal biochemical finding on antenatal screening of mother   . Abnormal quad screen   . Echogenic focus of heart of fetus affecting antepartum care of mother   . Drug use affecting pregnancy, antepartum 05/19/2014  . Seizure  disorder during pregnancy, antepartum ([place]) 05/13/2014  . Supervision of high risk pregnancy, antepartum 05/13/2014  . Asthma complicating pregnancy, antepartum 05/13/2014  . History of food anaphylaxis 05/13/2014  . Seizure disorder, sept 2015 last seizure 01/19/2013    Past Surgical History:  Procedure Laterality Date  . APPENDECTOMY    . CESAREAN SECTION  2013  . CESAREAN SECTION N/A 11/08/2014   Procedure: CESAREAN SECTION;  Surgeon: Truett Mainland, DO;  Location: [facility];  Service: Obstetrics;  Laterality: N/A;  . CHOLECYSTECTOMY N/A 10/16/2015   Procedure: LAPAROSCOPIC CHOLECYSTECTOMY WITH POSSIBLE INTRAOPERATIVE CHOLANGIOGRAM;  Surgeon: Donnie Mesa, MD;  Location: [facility];  Service: General;  Laterality: N/A;  . ESOPHAGOGASTRODUODENOSCOPY (EGD) WITH PROPOFOL N/A 11/15/2016   Procedure: ESOPHAGOGASTRODUODENOSCOPY (EGD) WITH PROPOFOL;  Surgeon: Clarene Essex, MD;  Location: [facility];  Service: Endoscopy;  Laterality: N/A;  . INGUINAL HERNIA REPAIR Bilateral ~ 1996  . NERVE, TENDON AND ARTERY REPAIR Right 09/23/2012   Procedure: I&D and Repair As Necessary/Right Hand and Palm;  Surgeon: Roseanne Kaufman, MD;  Location: [facility];  Service: Orthopedics;  Laterality: Right;  . TONSILLECTOMY      OB History    Gravida Para Term Preterm AB Living   3 3 3     3    SAB TAB Ectopic Multiple Live Births         0 3       Home Medications    Prior to Admission medications  Medication Sig Start Date End Date Taking? Authorizing Provider  albuterol (PROVENTIL HFA;VENTOLIN HFA) 108 (90 Base) MCG/ACT inhaler Inhale 2 puffs into the lungs every 4 (four) hours as needed for wheezing or shortness of breath. 09/22/15   Langeland, Leda Quail, MD  albuterol (PROVENTIL) (2.5 MG/3ML) 0.083% nebulizer solution Take 3 mLs (2.5 mg total) by nebulization every 4 (four) hours as needed for wheezing or shortness of breath. 09/22/15   Maren Reamer, MD  etonogestrel (NEXPLANON) 68 MG IMPL implant 1 each once by  Subdermal route.    [provider]  levETIRAcetam (KEPPRA) 500 MG tablet Take 1 tablet (500 mg total) by mouth 2 (two) times daily. 09/22/15   Maren Reamer, MD  ondansetron (ZOFRAN ODT) 4 MG disintegrating tablet Take 1 tablet (4 mg total) by mouth every 8 (eight) hours as needed for nausea or vomiting. 07/27/16   Joy, Shawn C, PA-C  oxyCODONE (ROXICODONE) 5 MG immediate release tablet Take 1 tablet (5 mg total) by mouth every 4 (four) hours as needed for severe pain. 06/23/16   Nona Dell, PA-C  pantoprazole (PROTONIX) 20 MG tablet Take 1 tablet (20 mg total) by mouth daily. Patient not taking: Reported on 11/12/2016 07/27/16   Lorayne Bender, PA-C  promethazine (PHENERGAN) 12.5 MG tablet Take 1 tablet (12.5 mg total) by mouth every 6 (six) hours as needed for nausea or vomiting. 11/16/16   Patrecia Pour, MD    Family History Family History  Problem Relation Age of Onset  . Cancer Mother        cervical cancer  . Asthma Mother   . Hypertension Father   . Sickle cell anemia Father   . Asthma Sister   . Diabetes Maternal Aunt   . Cancer Maternal Grandmother     Social History Social History   Tobacco Use  . Smoking status: Former Smoker    Packs/day: 0.25    Years: 4.00    Pack years: 1.00    Types: Cigarettes    Last attempt to quit: 10/02/2015    Years since quitting: 1.2  . Smokeless tobacco: Never Used  Substance Use Topics  . Alcohol use: No    Alcohol/week: 0.0 oz    Frequency: Never  . Drug use: No     Allergies   Cheese; Chocolate; Ibuprofen; Ivp dye [iodinated diagnostic agents]; Latex; Orange juice [orange oil]; Other; Peach [prunus persica]; Peanuts [peanut oil]; Pear; Prednisone; Raspberry; Tylenol [acetaminophen]; Amoxicillin; Apricot flavor; Doxycycline; Erythromycin; Milk of magnesia [magnesium hydroxide]; and Penicillins   Review of Systems Review of Systems  Unable to perform ROS: Acuity of condition     Physical Exam Updated  Vital Signs BP (!) 143/92   Pulse (!) 137   Temp 98.2 F (36.8 C) (Oral)   Resp (!) 39   LMP 12/10/2016   SpO2 100%   Physical Exam  Constitutional: She is oriented to person, place, and time. She appears well-developed and well-nourished.  HENT:  Head: Normocephalic and atraumatic.  Eyes: Conjunctivae and EOM are normal. Pupils are equal, round, and reactive to light.  Neck: Normal range of motion. Neck supple.  Cardiovascular: Normal rate and regular rhythm. Exam reveals no gallop and no friction rub.  No murmur heard. Pulmonary/Chest: She is in respiratory distress. She has no wheezes. She has no rales. She exhibits no tenderness.  Tachypnea, tripoding, accessory muscle use, grossly diminished lung sounds throughout Moderate to severe respiratory distress, unable to speak  Abdominal: Soft. Bowel  sounds are normal. She exhibits no distension and no mass. There is no tenderness. There is no rebound and no guarding.  Musculoskeletal: Normal range of motion. She exhibits no edema or tenderness.  Neurological: She is alert and oriented to person, place, and time.  Skin: Skin is warm and dry.  Psychiatric: She has a normal mood and affect. Her behavior is normal. Judgment and thought content normal.  Nursing note and vitals reviewed.    ED Treatments / Results  Labs (all labs ordered are listed, but only abnormal results are displayed) Labs Reviewed  CBC WITH DIFFERENTIAL/PLATELET  BASIC METABOLIC PANEL    EKG  EKG Interpretation  Date/Time:  Thursday January 03 2017 04:13:00 EST Ventricular Rate:  118 PR Interval:    QRS Duration: 110 QT Interval:  335 QTC Calculation: 470 R Axis:   40 Text Interpretation:  Sinus tachycardia Artifact Confirmed by Shanon Rosser [id number]) on 01/03/2017 4:16:40 AM       Radiology No results found.  Procedures Procedures (including critical care time) CRITICAL CARE Performed by: Montine Circle   Total critical care time: 43  minutes  Critical care time was exclusive of separately billable procedures and treating other patients.  Critical care was necessary to treat or prevent imminent or life-threatening deterioration.  Critical care was time spent personally by me on the following activities: development of treatment plan with patient and/or surrogate as well as nursing, discussions with consultants, evaluation of patient's response to treatment, examination of patient, obtaining history from patient or surrogate, ordering and performing treatments and interventions, ordering and review of laboratory studies, ordering and review of radiographic studies, pulse oximetry and re-evaluation of patient's condition.  Medications Ordered in ED Medications  albuterol (PROVENTIL) (2.5 MG/3ML) 0.083% nebulizer solution 5 mg (5 mg Nebulization Given 01/03/17 0410)  dexamethasone (DECADRON) injection 10 mg (10 mg Intravenous Given 01/03/17 0420)     Initial Impression / Assessment and Plan / ED Course  I have reviewed the triage vital signs and the nursing notes.  Pertinent labs & imaging results that were available during my care of the patient were reviewed by me and considered in my medical decision making (see chart for details).     Patient in respiratory distress.  Hx of asthma.  Has required intubation in the past.    Immediately seen by myself and Dr. Florina Ou.  Will start BiPAP and continuous albuterol treatment.  Appears calmer on BiPAP.   PCO2 is 63.7.  Still tachycardic and tachypneic.    Discussed with Dr. Hal Hope, who recommends critical admission and ICU.  Discussed with intensivist doc in the box, who will have critical care come to see the patient and admit to the unit.  Given mag, continuous neb, decadron in Ed.  Shared visit with Dr. Florina Ou.  Final Clinical Impressions(s) / ED Diagnoses   Final diagnoses:  Severe asthma with exacerbation, unspecified whether persistent    ED Discharge  Orders    None       Montine Circle, PA-C 01/03/17 0618    Molpus, Jenny Reichmann, MD 01/03/17 [phone]

## 2017-01-03 NOTE — Plan of Care (Signed)
  Progressing Education: Knowledge of General Education information will improve 01/03/2017 1753 - Progressing by Naomie Dean, RN Clinical Measurements: Ability to maintain clinical measurements within normal limits will improve 01/03/2017 1753 - Progressing by Naomie Dean, RN Will remain free from infection 01/03/2017 1753 - Progressing by Naomie Dean, RN Diagnostic test results will improve 01/03/2017 1753 - Progressing by Naomie Dean, RN Respiratory complications will improve 01/03/2017 1753 - Progressing by Naomie Dean, RN Cardiovascular complication will be avoided 01/03/2017 1753 - Progressing by Naomie Dean, RN Activity: Risk for activity intolerance will decrease 01/03/2017 1753 - Progressing by Naomie Dean, RN Nutrition: Adequate nutrition will be maintained 01/03/2017 1753 - Progressing by Naomie Dean, RN Elimination: Will not experience complications related to bowel motility 01/03/2017 1753 - Progressing by Naomie Dean, RN Will not experience complications related to urinary retention 01/03/2017 1753 - Progressing by Naomie Dean, RN Safety: Ability to remain free from injury will improve 01/03/2017 1753 - Progressing by Naomie Dean, RN Skin Integrity: Risk for impaired skin integrity will decrease 01/03/2017 1753 - Progressing by Naomie Dean, RN   Not Progressing Coping: Level of anxiety will decrease 01/03/2017 1753 - Not Progressing by Naomie Dean, RN Pt is very anxious when in pain Pain Managment: General experience of comfort will improve 01/03/2017 1753 - Not Progressing by Naomie Dean, RN Pt continues to complain of pain    Adequate for Discharge Health Behavior/Discharge Planning: Ability to manage health-related needs will  improve 01/03/2017 1753 - Adequate for Discharge by Naomie Dean, RN

## 2017-01-03 NOTE — ED Triage Notes (Signed)
Patient states she began coughing this morning and then wheezing. Reports she became short of breath and felt her chest tighting. Patient presents with cough, chest tightness, shortness of breath, stridor.

## 2017-01-04 ENCOUNTER — Inpatient Hospital Stay (HOSPITAL_COMMUNITY): Payer: Medicaid Other

## 2017-01-04 DIAGNOSIS — K219 Gastro-esophageal reflux disease without esophagitis: Secondary | ICD-10-CM

## 2017-01-04 DIAGNOSIS — R569 Unspecified convulsions: Secondary | ICD-10-CM

## 2017-01-04 LAB — CBC
HCT: 32.6 % — ABNORMAL LOW (ref 36.0–46.0)
Hemoglobin: 10.9 g/dL — ABNORMAL LOW (ref 12.0–15.0)
MCH: 29.9 pg (ref 26.0–34.0)
MCHC: 33.4 g/dL (ref 30.0–36.0)
MCV: 89.3 fL (ref 78.0–100.0)
Platelets: 237 K/uL (ref 150–400)
RBC: 3.65 MIL/uL — ABNORMAL LOW (ref 3.87–5.11)
RDW: 14.7 % (ref 11.5–15.5)
WBC: 28.1 K/uL — ABNORMAL HIGH (ref 4.0–10.5)

## 2017-01-04 LAB — RESPIRATORY PANEL BY PCR
ADENOVIRUS-RVPPCR: NOT DETECTED
Bordetella pertussis: NOT DETECTED
CORONAVIRUS 229E-RVPPCR: NOT DETECTED
CORONAVIRUS HKU1-RVPPCR: NOT DETECTED
CORONAVIRUS NL63-RVPPCR: NOT DETECTED
CORONAVIRUS OC43-RVPPCR: NOT DETECTED
Chlamydophila pneumoniae: NOT DETECTED
Influenza A: NOT DETECTED
Influenza B: NOT DETECTED
MYCOPLASMA PNEUMONIAE-RVPPCR: NOT DETECTED
Metapneumovirus: NOT DETECTED
PARAINFLUENZA VIRUS 1-RVPPCR: NOT DETECTED
Parainfluenza Virus 2: NOT DETECTED
Parainfluenza Virus 3: NOT DETECTED
Parainfluenza Virus 4: NOT DETECTED
Respiratory Syncytial Virus: NOT DETECTED
Rhinovirus / Enterovirus: NOT DETECTED

## 2017-01-04 LAB — BASIC METABOLIC PANEL WITH GFR
Anion gap: 9 (ref 5–15)
BUN: 7 mg/dL (ref 6–20)
CO2: 23 mmol/L (ref 22–32)
Calcium: 9.3 mg/dL (ref 8.9–10.3)
Chloride: 106 mmol/L (ref 101–111)
Creatinine, Ser: 0.41 mg/dL — ABNORMAL LOW (ref 0.44–1.00)
GFR calc Af Amer: >60 mL/min
GFR calc non Af Amer: >60 mL/min
Glucose, Bld: 109 mg/dL — ABNORMAL HIGH (ref 65–99)
Potassium: 3.3 mmol/L — ABNORMAL LOW (ref 3.5–5.1)
Sodium: 138 mmol/L (ref 135–145)

## 2017-01-04 MED ORDER — POTASSIUM CHLORIDE CRYS ER 20 MEQ PO TBCR
40.0000 meq | EXTENDED_RELEASE_TABLET | Freq: Once | ORAL | Status: AC
  Administered 2017-01-04: 40 meq via ORAL
  Filled 2017-01-04: qty 2

## 2017-01-04 MED ORDER — OXYCODONE HCL 5 MG PO TABS
2.5000 mg | ORAL_TABLET | Freq: Four times a day (QID) | ORAL | Status: DC | PRN
  Administered 2017-01-04 – 2017-01-05 (×3): 2.5 mg via ORAL
  Filled 2017-01-04 (×5): qty 1

## 2017-01-04 NOTE — Progress Notes (Signed)
Upon AM assessment pt c/o throbbing HA that is rated 10/10 in severity, and is described as being typical of her migraines. 2.5 mg Oxycodone given; See MAR for details. Will continue to monitor closely.

## 2017-01-04 NOTE — Progress Notes (Signed)
Name: Jasmine [name] MRN: [id number] DOB: Oct 17, 1990    ADMISSION DATE:  01/03/2017 CONSULTATION DATE:  01/03/17   REFERRING MD :  Dr. Florina Ou   CHIEF COMPLAINT:  Acute Asthma Exacerbation   HISTORY OF PRESENT ILLNESS:  26 y/o F, smoker, who presented to [facility] ER on 12/20 with increased cough, wheezing, chest tightness and shortness of breath.    The patient has a history of asthma (has required intubation in the past), reported sleep apnea, seizures and GERD.  She reports she has been feeling poorly for 3 days.  Her children have been sick.  She denies smoking.  States she has been using her asthma medications.  She denies fevers, chills, nausea, vomiting, diarrhea.  She is unable to provide much history as she is on BiPAP.  Initial ER records note that she initially presented with tachypnea, tripoding and accessory muscle use.  She was unable to speak on arrival.  The patient was treated with albuterol and was placed on BiPAP therapy.  She made gradual improvement on BiPAP.  In addition, she was treated with magnesium & decadron.    VBG 7.246 / 63.  Follow up ABG 7.357 / 42 / 270 / 23.  Labs - Na 138, K 3.4, Cl 109, CO2 25, glucose 91, BUN 14, Sr Cr 0.73, WBC 20.3, hgb 11.4 and platelets 260.  CXR was negative.   PCCM consulted for evaluation.   Chart review shows she is frequently seen in the ER for the same.  She was last seen by Dr. Lamonte Sakai in 2016 during pregnancy.   SUBJECTIVE:  Better this am, went all night without BiPAP Stronger voice  VITAL SIGNS: Temp:  [98.4 F (36.9 C)-98.8 F (37.1 C)] 98.8 F (37.1 C) (12/21 0800) Pulse Rate:  [68-103] 72 (12/21 0800) Resp:  [10-37] 18 (12/21 0800) BP: (98-138)/(41-85) 98/50 (12/21 0800) SpO2:  [100 %] 100 % (12/21 0843) FiO2 (%):  [40 %] 40 % (12/20 1213) Weight:  [73.1 kg (161 lb 2.5 oz)] 73.1 kg (161 lb 2.5 oz) (12/20 1200)  PHYSICAL EXAMINATION: General: young adult female lying in bed, no distress HEENT: Oropharynx clear,  moist.  No stridor this morning PSY: calm/appropriate  Neuro: Awake, alert, well-oriented, normal strength CV: Regular, no murmur PULM: Somewhat distant but normal respiratory effort.  No wheezes on a normal breath. GI: Soft, benign, positive bowel sounds Extremities: No edema Skin: No rash  Recent Labs  Lab 01/03/17 0416 01/04/17 0250  NA 138 138  K 3.4* 3.3*  CL 109 106  CO2 25 23  BUN 14 7  CREATININE 0.73 0.41*  GLUCOSE 91 109*    Recent Labs  Lab 01/03/17 0416 01/04/17 0250  HGB 11.4* 10.9*  HCT 34.2* 32.6*  WBC 20.3* 28.1*  PLT 260 237    Dg Chest Port 1 View  Result Date: 01/04/2017 CLINICAL DATA:  Asthma exacerbation with shortness of breath. EXAM: PORTABLE CHEST 1 VIEW COMPARISON:  01/03/2017, 11/24/2016 and earlier. FINDINGS: Suboptimal inspiration accounts for crowded bronchovascular markings, bibasilar atelectasis, and accentuates the cardiac silhouette. Taking this into account, cardiomediastinal silhouette unremarkable. Lungs otherwise clear. No visible pleural effusions. IMPRESSION: 1. Suboptimal inspiration which accounts for bibasilar atelectasis. 2.  No acute cardiopulmonary disease otherwise. Electronically Signed   By: Evangeline Dakin M.D.   On: 01/04/2017 08:53   Dg Chest Port 1 View  Result Date: 01/03/2017 CLINICAL DATA:  Shortness of breath EXAM: PORTABLE CHEST 1 VIEW COMPARISON:  Chest radiograph 11/24/2016 FINDINGS: The heart size  and mediastinal contours are within normal limits. Both lungs are clear. The visualized skeletal structures are unremarkable. IMPRESSION: No active disease. Electronically Signed   By: Ulyses Jarred M.D.   On: 01/03/2017 04:37      SIGNIFICANT EVENTS  12/20  Admit with acute asthma exacerbation   STUDIES:   CULTURES RVP 12/20 >>     ASSESSMENT / PLAN:  Discussion: 26 y/o F with PMH of asthma, reported OSA (not on CPAP) admitted 12/20 with an acute asthma exacerbation.    Acute Asthma Exacerbation,  improving P: Should be able to discontinue BiPAP at this point She does not tolerate prednisone or oral corticosteroids so likely will be unable to send her out with a standard taper Okay to discharge when stable on her standard Advair, albuterol as needed Controlled contributing factors including GERD, allergic rhinitis Urine drug screen was positive for THC, clearly smoking this or tobacco is not helping her asthma RVP still pending  Follow up visit Dr Lamonte Sakai 02/08/16, 10:30 am  Seizure Disorder  P:  Continue Keppra BID    Hopefully she will be ready for discharge soon.  I will follow-up with her as an outpatient.  She may benefit from a change from Advair to an alternative LABA/ICS that is non-powdered, will not have the same degree of throat irritation.  Call if we can assist further  Baltazar Apo, MD, PhD 01/04/2017, 9:57 AM [facility] [phone] or if no answer [phone]

## 2017-01-04 NOTE — Progress Notes (Addendum)
PROGRESS NOTE  Jasmine [name] YNW:[id number] DOB: Feb 06, 1990 DOA: 01/03/2017 PCP: Patient, No Pcp Per   LOS: 1 day   Brief Narrative / Interim history: 26 year old female with history of asthma, seizure disorder, GERD, sleep apnea, as well as tobacco abuse, who presented to the emergency room on 12/20 with significant respiratory distress in the setting of asthma exacerbation. Critical care admitted patient to the ICU.  Initial chest x-ray was unremarkable.  She was on BiPAP, did not need to be intubated and improved, and she was transferred to the hospitalist service on 12/21.  Assessment & Plan: Active Problems:   Asthma exacerbation   Acute asthma exacerbation -Patient initially admitted to the ICU, improved on BiPAP, did not require intubation and is not transfer to the hospitalist service on 12/21 -Respiratory virus panel pending -Currently on Brovana, Pulmicort, duo nebs as well as IV Solu-Medrol, continue -Improving  Acute hypoxic respiratory failure -Due to #1, on BiPAP on admission, weaned off to nasal cannula this morning, did not require BiPAP overnight -Continue to closely monitor, reconsult critical care if any signs of decompensation  Seizure disorder -Continue Keppra  GERD -Continue Protonix  Leukocytosis -?  Viral URI versus steroid-induced   DVT prophylaxis: SCDs Code Status: Full code Family Communication: no family at bedside Disposition Plan: remain in SDU today   Consultants:   Critical care  Procedures:   None   Antimicrobials:  None    Subjective: -Feels a little bit better however still "tight in her chest", has not attempted to get out of bed and go to the bathroom, is not sure if she is ready for it.  Complains of a migraine headache  Objective: Vitals:   01/04/17 0200 01/04/17 0240 01/04/17 0400 01/04/17 0503  BP: (!) 122/53  (!) 98/41   Pulse: 68  (!) 101   Resp: 13  (!) 21   Temp:    98.4 F (36.9 C)  TempSrc:    Oral  SpO2:  100% 100% 100%   Weight:      Height:        Intake/Output Summary (Last 24 hours) at 01/04/2017 6578 Last data filed at 01/04/2017 0029 Gross per 24 hour  Intake -  Output 900 ml  Net -900 ml   Filed Weights   01/03/17 1200  Weight: 73.1 kg (161 lb 2.5 oz)    Examination:  Constitutional: NAD Eyes: lids and conjunctivae normal ENMT: Mucous membranes are moist. Respiratory: Overall decreased breath sounds, no wheezing or crackles heard.  Normal respiratory effort Cardiovascular: Regular rate and rhythm, no murmurs / rubs / gallops. No LE edema. Abdomen: no tenderness.  Skin: no rashes Neurologic: non focal   Data Reviewed: I have independently reviewed following labs and imaging studies   CBC: Recent Labs  Lab 01/03/17 0416 01/04/17 0250  WBC 20.3* 28.1*  NEUTROABS 11.0*  --   HGB 11.4* 10.9*  HCT 34.2* 32.6*  MCV 91.7 89.3  PLT 260 469   Basic Metabolic Panel: Recent Labs  Lab 01/03/17 0416 01/04/17 0250  NA 138 138  K 3.4* 3.3*  CL 109 106  CO2 25 23  GLUCOSE 91 109*  BUN 14 7  CREATININE 0.73 0.41*  CALCIUM 8.5* 9.3   GFR: Estimated Creatinine Clearance: 107.6 mL/min (A) (by C-G formula based on SCr of 0.41 mg/dL (L)). Liver Function Tests: No results for input(s): AST, ALT, ALKPHOS, BILITOT, PROT, ALBUMIN in the last 168 hours. No results for input(s): LIPASE, AMYLASE in the last 168  hours. No results for input(s): AMMONIA in the last 168 hours. Coagulation Profile: No results for input(s): INR, PROTIME in the last 168 hours. Cardiac Enzymes: No results for input(s): CKTOTAL, CKMB, CKMBINDEX, TROPONINI in the last 168 hours. BNP (last 3 results) No results for input(s): PROBNP in the last 8760 hours. HbA1C: No results for input(s): HGBA1C in the last 72 hours. CBG: No results for input(s): GLUCAP in the last 168 hours. Lipid Profile: No results for input(s): CHOL, HDL, LDLCALC, TRIG, CHOLHDL, LDLDIRECT in the last 72 hours. Thyroid  Function Tests: No results for input(s): TSH, T4TOTAL, FREET4, T3FREE, THYROIDAB in the last 72 hours. Anemia Panel: No results for input(s): VITAMINB12, FOLATE, FERRITIN, TIBC, IRON, RETICCTPCT in the last 72 hours. Urine analysis:    Component Value Date/Time   COLORURINE YELLOW 11/12/2016 0820   APPEARANCEUR CLEAR 11/12/2016 0820   LABSPEC 1.015 11/12/2016 0820   PHURINE 6.0 11/12/2016 0820   GLUCOSEU NEGATIVE 11/12/2016 0820   HGBUR NEGATIVE 11/12/2016 0820   BILIRUBINUR NEGATIVE 11/12/2016 0820   KETONESUR NEGATIVE 11/12/2016 0820   PROTEINUR NEGATIVE 11/12/2016 0820   UROBILINOGEN 1.0 11/04/2014 0946   NITRITE NEGATIVE 11/12/2016 0820   LEUKOCYTESUR NEGATIVE 11/12/2016 0820   Sepsis Labs: Invalid input(s): PROCALCITONIN, LACTICIDVEN  Recent Results (from the past 240 hour(s))  MRSA PCR Screening     Status: None   Collection Time: 01/03/17 11:57 AM  Result Value Ref Range Status   MRSA by PCR NEGATIVE NEGATIVE Final    Comment:        The GeneXpert MRSA Assay (FDA approved for NASAL specimens only), is one component of a comprehensive MRSA colonization surveillance program. It is not intended to diagnose MRSA infection nor to guide or monitor treatment for MRSA infections.       Radiology Studies: Dg Chest Port 1 View  Result Date: 01/03/2017 CLINICAL DATA:  Shortness of breath EXAM: PORTABLE CHEST 1 VIEW COMPARISON:  Chest radiograph 11/24/2016 FINDINGS: The heart size and mediastinal contours are within normal limits. Both lungs are clear. The visualized skeletal structures are unremarkable. IMPRESSION: No active disease. Electronically Signed   By: Ulyses Jarred M.D.   On: 01/03/2017 04:37     Scheduled Meds: . arformoterol  15 mcg Nebulization BID  . budesonide (PULMICORT) nebulizer solution  0.5 mg Nebulization BID  . chlorhexidine  15 mL Mouth Rinse BID  . heparin  5,000 Units Subcutaneous Q8H  . ipratropium-albuterol  3 mL Nebulization Q6H  .  levETIRAcetam  500 mg Oral BID  . lip balm      . mouth rinse  15 mL Mouth Rinse q12n4p  . methylPREDNISolone (SOLU-MEDROL) injection  40 mg Intravenous Q12H  . pantoprazole  40 mg Oral Daily   Continuous Infusions: . sodium chloride    . sodium chloride 10 mL/hr at 01/03/17 [place], MD, PhD [facility] Pager [phone]  If 7PM-7AM, please contact night-coverage [email or web address] Password [facility] 01/04/2017, 6:14 AM

## 2017-01-04 NOTE — Care Management Note (Signed)
Case Management Note  Patient Details  Name: Jasmine [name] MRN: [id number] Date of Birth: 10/13/1990  Subjective/Objective:                  asthma Action/Plan: Date: January 04, 2017 Velva Harman, BSN, [place], [place] Chart and notes review for patient progress and needs. Will follow for case management and discharge needs. Next review date: [id number] Expected Discharge Date:  (unknown)               Expected Discharge Plan:  Home/Self Care  In-House Referral:     Discharge planning Services  CM Consult  Post Acute Care Choice:    Choice offered to:     DME Arranged:    DME Agency:     HH Arranged:    HH Agency:     Status of Service:  In process, will continue to follow  If discussed at Long Length of Stay Meetings, dates discussed:    Additional Comments:  Leeroy Cha, RN 01/04/2017, 8:31 AM

## 2017-01-05 LAB — CBC
HCT: 36.7 % (ref 36.0–46.0)
HEMOGLOBIN: 12 g/dL (ref 12.0–15.0)
MCH: 29.9 pg (ref 26.0–34.0)
MCHC: 32.7 g/dL (ref 30.0–36.0)
MCV: 91.5 fL (ref 78.0–100.0)
Platelets: 250 10*3/uL (ref 150–400)
RBC: 4.01 MIL/uL (ref 3.87–5.11)
RDW: 14.9 % (ref 11.5–15.5)
WBC: 26.6 10*3/uL — ABNORMAL HIGH (ref 4.0–10.5)

## 2017-01-05 LAB — BASIC METABOLIC PANEL
Anion gap: 14 (ref 5–15)
BUN: 13 mg/dL (ref 6–20)
CHLORIDE: 101 mmol/L (ref 101–111)
CO2: 21 mmol/L — AB (ref 22–32)
CREATININE: 0.62 mg/dL (ref 0.44–1.00)
Calcium: 9.4 mg/dL (ref 8.9–10.3)
GFR calc Af Amer: >60 mL/min (ref >60)
GFR calc non Af Amer: >60 mL/min (ref >60)
GLUCOSE: 78 mg/dL (ref 65–99)
POTASSIUM: 3 mmol/L — AB (ref 3.5–5.1)
SODIUM: 136 mmol/L (ref 135–145)

## 2017-01-05 LAB — GLUCOSE, CAPILLARY: GLUCOSE-CAPILLARY: 100 mg/dL — AB (ref 65–99)

## 2017-01-05 MED ORDER — ALPRAZOLAM 0.5 MG PO TABS
0.5000 mg | ORAL_TABLET | Freq: Two times a day (BID) | ORAL | Status: DC | PRN
  Administered 2017-01-05: 0.5 mg via ORAL
  Filled 2017-01-05: qty 1

## 2017-01-05 MED ORDER — IPRATROPIUM-ALBUTEROL 0.5-2.5 (3) MG/3ML IN SOLN
3.0000 mL | Freq: Three times a day (TID) | RESPIRATORY_TRACT | Status: DC
  Administered 2017-01-05 – 2017-01-06 (×3): 3 mL via RESPIRATORY_TRACT
  Filled 2017-01-05 (×3): qty 3

## 2017-01-05 MED ORDER — POTASSIUM CHLORIDE CRYS ER 20 MEQ PO TBCR
40.0000 meq | EXTENDED_RELEASE_TABLET | Freq: Two times a day (BID) | ORAL | Status: DC
  Administered 2017-01-05: 40 meq via ORAL
  Filled 2017-01-05: qty 2

## 2017-01-05 NOTE — Progress Notes (Signed)
PROGRESS NOTE    Jasmine [name]  URK:[id number] DOB: 1990/02/08 DOA: 01/03/2017 PCP: Patient, No Pcp Per    Brief Narrative:   26 year old female with history of asthma, seizure disorder, GERD, sleep apnea, as well as tobacco abuse, who presented to the emergency room on 12/20 with significant respiratory distress in the setting of asthma exacerbation. Critical care admitted patient to the ICU.  Initial chest x-ray was unremarkable.  She was on BiPAP, did not need to be intubated and improved, and she was transferred to the hospitalist service on 12/21.   Assessment & Plan:   Active Problems:   Asthma exacerbation   Acute respiratory failure with hypoxia secondary to acute asthma exacerbation:  - admitted to ICU, on BIPAP, till 12/21.  RVP negative.  Resume IV steroids, brovana and pulmicort and duonebs .  Pt still very dyspneic, and requiring 2 lit of [place] oxygen.  Pt tearful and anxious on exam.  Added xanax.  Will probably discharge home in am.  Pccm consulted and recommendations.    Seizure disorder:  Resume keppra.    GERD:  Protonix.   Leukocytosis:  Viral vs steroid induced.  Recommend follow up with CBC IN one week.      DVT prophylaxis: heparin sq.  Code Status:  Full code.  Family Communication: none at bedside.  Disposition Plan: home in 1 to 2 days.   Consultants:   PCCM.    Procedures:  None.    Antimicrobials: none.    Subjective: Requesting prescriptions on discharge.   Objective: Vitals:   01/05/17 0800 01/05/17 0935 01/05/17 1000 01/05/17 1200  BP: 121/64  (!) 136/117   Pulse: (!) 55 (!) 58 76   Resp: 16 17 (!) 32   Temp: 98.4 F (36.9 C)   98.3 F (36.8 C)  TempSrc: Oral   Oral  SpO2: 100%  97%   Weight:      Height:        Intake/Output Summary (Last 24 hours) at 01/05/2017 1523 Last data filed at 01/05/2017 1000 Gross per 24 hour  Intake 298.5 ml  Output 500 ml  Net -201.5 ml   Filed Weights   01/03/17 1200  01/05/17 0314  Weight: 73.1 kg (161 lb 2.5 oz) 72.9 kg (160 lb 11.5 oz)    Examination:  General exam: Appears anxious and on 2l it of [place] oxygen.  Respiratory system: bilateral wheezing heard posteriorly. Air entry fair.  Cardiovascular system: S1 & S2 heard,tachycardic.  No JVD, murmurs, rubs, gallops or clicks. No pedal edema. Gastrointestinal system: Abdomen is nondistended, soft and nontender. No organomegaly or masses felt. Normal bowel sounds heard. Central nervous system: Alert and oriented. No focal neurological deficits. Extremities: Symmetric 5 x 5 power. Skin: No rashes, lesions or ulcers Psychiatry: anxious and in mild distress.      Data Reviewed: I have personally reviewed following labs and imaging studies  CBC: Recent Labs  Lab 01/03/17 0416 01/04/17 0250 01/05/17 0321  WBC 20.3* 28.1* 26.6*  NEUTROABS 11.0*  --   --   HGB 11.4* 10.9* 12.0  HCT 34.2* 32.6* 36.7  MCV 91.7 89.3 91.5  PLT 260 237 831   Basic Metabolic Panel: Recent Labs  Lab 01/03/17 0416 01/04/17 0250 01/05/17 0321  NA 138 138 136  K 3.4* 3.3* 3.0*  CL 109 106 101  CO2 25 23 21*  GLUCOSE 91 109* 78  BUN 14 7 13   CREATININE 0.73 0.41* 0.62  CALCIUM 8.5* 9.3 9.4  GFR: Estimated Creatinine Clearance: 107.6 mL/min (by C-G formula based on SCr of 0.62 mg/dL). Liver Function Tests: No results for input(s): AST, ALT, ALKPHOS, BILITOT, PROT, ALBUMIN in the last 168 hours. No results for input(s): LIPASE, AMYLASE in the last 168 hours. No results for input(s): AMMONIA in the last 168 hours. Coagulation Profile: No results for input(s): INR, PROTIME in the last 168 hours. Cardiac Enzymes: No results for input(s): CKTOTAL, CKMB, CKMBINDEX, TROPONINI in the last 168 hours. BNP (last 3 results) No results for input(s): PROBNP in the last 8760 hours. HbA1C: No results for input(s): HGBA1C in the last 72 hours. CBG: Recent Labs  Lab 01/05/17 0933  GLUCAP 100*   Lipid Profile: No  results for input(s): CHOL, HDL, LDLCALC, TRIG, CHOLHDL, LDLDIRECT in the last 72 hours. Thyroid Function Tests: No results for input(s): TSH, T4TOTAL, FREET4, T3FREE, THYROIDAB in the last 72 hours. Anemia Panel: No results for input(s): VITAMINB12, FOLATE, FERRITIN, TIBC, IRON, RETICCTPCT in the last 72 hours. Sepsis Labs: No results for input(s): PROCALCITON, LATICACIDVEN in the last 168 hours.  Recent Results (from the past 240 hour(s))  MRSA PCR Screening     Status: None   Collection Time: 01/03/17 11:57 AM  Result Value Ref Range Status   MRSA by PCR NEGATIVE NEGATIVE Final    Comment:        The GeneXpert MRSA Assay (FDA approved for NASAL specimens only), is one component of a comprehensive MRSA colonization surveillance program. It is not intended to diagnose MRSA infection nor to guide or monitor treatment for MRSA infections.   Respiratory Panel by PCR     Status: None   Collection Time: 01/03/17  2:33 PM  Result Value Ref Range Status   Adenovirus NOT DETECTED NOT DETECTED Final   Coronavirus 229E NOT DETECTED NOT DETECTED Final   Coronavirus HKU1 NOT DETECTED NOT DETECTED Final   Coronavirus NL63 NOT DETECTED NOT DETECTED Final   Coronavirus OC43 NOT DETECTED NOT DETECTED Final   Metapneumovirus NOT DETECTED NOT DETECTED Final   Rhinovirus / Enterovirus NOT DETECTED NOT DETECTED Final   Influenza A NOT DETECTED NOT DETECTED Final   Influenza B NOT DETECTED NOT DETECTED Final   Parainfluenza Virus 1 NOT DETECTED NOT DETECTED Final   Parainfluenza Virus 2 NOT DETECTED NOT DETECTED Final   Parainfluenza Virus 3 NOT DETECTED NOT DETECTED Final   Parainfluenza Virus 4 NOT DETECTED NOT DETECTED Final   Respiratory Syncytial Virus NOT DETECTED NOT DETECTED Final   Bordetella pertussis NOT DETECTED NOT DETECTED Final   Chlamydophila pneumoniae NOT DETECTED NOT DETECTED Final   Mycoplasma pneumoniae NOT DETECTED NOT DETECTED Final    Comment: Performed at [facility], [place] [address], [place], [place] [address]         Radiology Studies: Dg Chest Port 1 View  Result Date: 01/04/2017 CLINICAL DATA:  Asthma exacerbation with shortness of breath. EXAM: PORTABLE CHEST 1 VIEW COMPARISON:  01/03/2017, 11/24/2016 and earlier. FINDINGS: Suboptimal inspiration accounts for crowded bronchovascular markings, bibasilar atelectasis, and accentuates the cardiac silhouette. Taking this into account, cardiomediastinal silhouette unremarkable. Lungs otherwise clear. No visible pleural effusions. IMPRESSION: 1. Suboptimal inspiration which accounts for bibasilar atelectasis. 2.  No acute cardiopulmonary disease otherwise. Electronically Signed   By: Evangeline Dakin M.D.   On: 01/04/2017 08:53        Scheduled Meds: . arformoterol  15 mcg Nebulization BID  . budesonide (PULMICORT) nebulizer solution  0.5 mg Nebulization BID  . chlorhexidine  15 mL Mouth Rinse BID  . heparin  5,000 Units Subcutaneous Q8H  . ipratropium-albuterol  3 mL Nebulization TID  . levETIRAcetam  500 mg Oral BID  . mouth rinse  15 mL Mouth Rinse q12n4p  . methylPREDNISolone (SOLU-MEDROL) injection  40 mg Intravenous Q12H  . pantoprazole  40 mg Oral Daily   Continuous Infusions: . sodium chloride    . sodium chloride 10 mL/hr at 01/03/17 1209     LOS: 2 days    Time spent: 35 minutes.     Hosie Poisson, MD [facility] Pager [phone]  If 7PM-7AM, please contact night-coverage [email or web address] Password [facility] 01/05/2017, 3:23 PM

## 2017-01-05 NOTE — Progress Notes (Signed)
Patient transferred to [facility] and sat up to eat lunch. She became increasingly short or breath to the point of being unable to speak in full sentences. O2 was increased from 2 L [place] to 4 L [place]. Breathing was labored and RR ranged from 25-40 breaths per minute. Diffuse wheezes noted. RT called to administer breathing treatment. MD paged; No new orders at this time. Will continue to monitor patient closely.

## 2017-01-05 NOTE — Progress Notes (Signed)
Blood glucose on AM labs 78. Pt refusing breakfast. Obtained CBG of 100. Will continue to encourage PO intake and monitor for signs and symptoms of hypoglycemia.

## 2017-01-05 NOTE — Progress Notes (Signed)
SATURATION QUALIFICATIONS: (This note is used to comply with regulatory documentation for home oxygen)  Patient Saturations on Room Air at Rest =80% put on 2 L Oxygen saturation increased to 100 %  Patient Saturations on Room Air while Ambulating = 99- 100%  Patient Saturations on 2 Liters of oxygen while Ambulating = 100%  Please briefly explain why patient needs home oxygen: Pt intermittently becomes short of breath.

## 2017-01-05 NOTE — Progress Notes (Signed)
Oxygen to be delivered to patient's room prior to discharge in the morning

## 2017-01-05 NOTE — Progress Notes (Signed)
Spoke with case management who stated they would arrange for home oxygen tomorrow morning prior to discharge.

## 2017-01-05 NOTE — Progress Notes (Signed)
Called case management at [phone]. Left VM stating that pt would be discharged tomorrow 01/06/17 in the AM and would need to go home on Oxygen.

## 2017-01-06 MED ORDER — IPRATROPIUM-ALBUTEROL 0.5-2.5 (3) MG/3ML IN SOLN: 3.0000 mL | RESPIRATORY_TRACT | 0 refills | Status: DC | PRN

## 2017-01-06 MED ORDER — GUAIFENESIN-DM 100-10 MG/5ML PO SYRP
5.0000 mL | ORAL_SOLUTION | Freq: Once | ORAL | Status: AC
  Administered 2017-01-06: 5 mL via ORAL
  Filled 2017-01-06: qty 10

## 2017-01-06 MED ORDER — FLUTICASONE-SALMETEROL 115-21 MCG/ACT IN AERO: 2.0000 | INHALATION_SPRAY | Freq: Two times a day (BID) | RESPIRATORY_TRACT | 1 refills | Status: DC

## 2017-01-06 MED ORDER — POTASSIUM CHLORIDE CRYS ER 20 MEQ PO TBCR
40.0000 meq | EXTENDED_RELEASE_TABLET | Freq: Once | ORAL | Status: AC
  Administered 2017-01-06: 40 meq via ORAL
  Filled 2017-01-06: qty 2

## 2017-01-06 MED ORDER — ALBUTEROL SULFATE HFA 108 (90 BASE) MCG/ACT IN AERS: 2.0000 | INHALATION_SPRAY | RESPIRATORY_TRACT | 3 refills | Status: DC | PRN

## 2017-01-06 MED ORDER — PANTOPRAZOLE SODIUM 20 MG PO TBEC: 20.0000 mg | DELAYED_RELEASE_TABLET | Freq: Every day | ORAL | 1 refills | Status: DC

## 2017-01-06 MED ORDER — PREDNISONE 5 MG PO TABS: 50.0000 mg | ORAL_TABLET | Freq: Every day | ORAL | 0 refills | Status: DC

## 2017-01-06 MED ORDER — PROMETHAZINE HCL 12.5 MG PO TABS: 12.5000 mg | ORAL_TABLET | Freq: Four times a day (QID) | ORAL | 0 refills | Status: DC | PRN

## 2017-01-06 MED ORDER — LEVETIRACETAM 500 MG PO TABS
500.0000 mg | ORAL_TABLET | Freq: Two times a day (BID) | ORAL | 1 refills | Status: DC
Start: 2017-01-06 — End: 2017-05-29

## 2017-01-06 NOTE — Discharge Summary (Addendum)
Physician Discharge Summary  Jasmine [name] ZOX:[id number] DOB: April 19, 1990 DOA: 01/03/2017  PCP: Patient, No Pcp Per  Admit date: 01/03/2017 Discharge date: 01/06/2017  Recommendations for Outpatient Follow-up:   Continue home nebulizer treatment Follow-up with primary care physician in a few days to make sure symptoms are stable Continue oxygen support via nasal cannula to keep oxygen saturation above 90% Taper down prednisone starting from 50 mg a day, taper down by 5 mg a day down to 0 mg. for ex, today 50 mg, tomorrow 45 mg, then 40 mg the following day and etc...    Discharge Diagnoses:  Active Problems:   Asthma on chronic exacerbation    Discharge Condition: stable; pt insists on going home, Jasmine [name] husband came from [place] to watch their 2 kids and he needs to go back to work and there is no one who can take care of the children at home   Diet recommendation: as tolerated   History of present illness:   Per brief narrative 59/50 "26 year old female with history of asthma, seizure disorder, GERD, sleep apnea, as well as tobacco abuse, who presented to the emergency room on 12/20 with significant respiratory distress in the setting of asthma exacerbation. Critical care admitted patient to the ICU.  Initial chest x-ray was unremarkable.  She was on BiPAP, did not need to be intubated and improved, and she was transferred to the hospitalist service on 12/21."    Hospital Course:   Active Problems:   Acute on chronic asthma exacerbation / moderate persistent - Continue current nebulizer treatments, oxygen for home prescribed - Patient was to follow with pulmonary per scheduled appointment - Continue prednisone taper as noted above   Signed:  Leisa Lenz, MD  [facility] 01/06/2017, 8:38 AM  Pager #: [phone]  Time spent in minutes: more than 30 minutes    Discharge Exam: Vitals:   01/06/17 0300 01/06/17 0425  BP:  107/69  Pulse: 65 (!) 56  Resp:  20 18  Temp:  98.6 F (37 C)  SpO2:  100%   Vitals:   01/05/17 2015 01/05/17 2107 01/06/17 0300 01/06/17 0425  BP: 138/85   107/69  Pulse: (!) 121  65 (!) 56  Resp: (!) 22  20 18   Temp: 99.3 F (37.4 C)   98.6 F (37 C)  TempSrc: Oral   Oral  SpO2: 94% 98%  100%  Weight: 69.6 kg (153 lb 6.4 oz)   74.7 kg (164 lb 10.9 oz)  Height: 5\' 6"  (1.676 m)       General: Pt is alert, follows commands appropriately, not in acute distress Cardiovascular: Regular rate and rhythm, S1/S2 + Respiratory: wheezing in upper lung lobes Abdominal: Soft, non tender, non distended, bowel sounds +, no guarding Extremities: no edema, no cyanosis, pulses palpable bilaterally DP and PT Neuro: Grossly nonfocal  Discharge Instructions  Discharge Instructions    Call MD for:  difficulty breathing, headache or visual disturbances   Complete by:  As directed    Call MD for:  redness, tenderness, or signs of infection (pain, swelling, redness, odor or green/yellow discharge around incision site)   Complete by:  As directed    Call MD for:  severe uncontrolled pain   Complete by:  As directed    Diet - low sodium heart healthy   Complete by:  As directed    Discharge instructions   Complete by:  As directed    Continue home nebulizer treatment Follow-up with primary care physician in  a few days to make sure symptoms are stable Continue oxygen support via nasal cannula to keep oxygen saturation above 90% Taper down prednisone starting from 50 mg a day, taper down by 5 mg a day down to 0 mg. for ex, today 50 mg, tomorrow 45 mg, then 40 mg the following day and etc...   Increase activity slowly   Complete by:  As directed      Allergies as of 01/06/2017      Reactions   Cheese Shortness Of Breath   Chocolate Shortness Of Breath   Ibuprofen Hives, Shortness Of Breath   Children's ibuprofen   Ivp Dye [iodinated Diagnostic Agents] Shortness Of Breath   Latex Anaphylaxis, Swelling, Other (See Comments)    Reaction:  Localized swelling    Orange Juice [orange Oil] Shortness Of Breath   Other Hives, Other (See Comments)   Pt states that she is allergic to all steroids except IV solu-medrol.     Peach [prunus Persica] Anaphylaxis   Peanuts [peanut Oil] Anaphylaxis   Pear Anaphylaxis   Prednisone Hives   Raspberry Anaphylaxis   Tylenol [acetaminophen] Hives, Shortness Of Breath, Other (See Comments)   Pt states that this is only with the liquid form.     Amoxicillin Hives, Other (See Comments)   Has patient had a PCN reaction causing immediate rash, facial/tongue/throat swelling, SOB or lightheadedness with hypotension: No Has patient had a PCN reaction causing severe rash involving mucus membranes or skin necrosis: No Has patient had a PCN reaction that required hospitalization: No Has patient had a PCN reaction occurring within the last 10 years: No If all of the above answers are "NO", then may proceed with Cephalosporin use.   Apricot Flavor Hives   Doxycycline Hives   Erythromycin Hives   Milk Of Magnesia [magnesium Hydroxide] Hives, Itching   Penicillins Hives, Other (See Comments)   Has patient had a PCN reaction causing immediate rash, facial/tongue/throat swelling, SOB or lightheadedness with hypotension: No Has patient had a PCN reaction causing severe rash involving mucus membranes or skin necrosis: No Has patient had a PCN reaction that required hospitalization: No Has patient had a PCN reaction occurring within the last 10 years: No If all of the above answers are "NO", then may proceed with Cephalosporin use.      Medication List    STOP taking these medications   ondansetron 4 MG disintegrating tablet Commonly known as:  ZOFRAN ODT     TAKE these medications   acetaminophen 325 MG tablet Commonly known as:  TYLENOL Take 325 mg by mouth every 6 (six) hours as needed for moderate pain.   albuterol 108 (90 Base) MCG/ACT inhaler Commonly known as:  PROVENTIL  HFA;VENTOLIN HFA Inhale 2 puffs into the lungs every 4 (four) hours as needed for wheezing or shortness of breath. What changed:  Another medication with the same name was removed. Continue taking this medication, and follow the directions you see here.   fluticasone-salmeterol 115-21 MCG/ACT inhaler Commonly known as:  ADVAIR HFA Inhale 2 puffs into the lungs 2 (two) times daily.   ipratropium-albuterol 0.5-2.5 (3) MG/3ML Soln Commonly known as:  DUONEB Take 3 mLs by nebulization every 4 (four) hours as needed.   levETIRAcetam 500 MG tablet Commonly known as:  KEPPRA Take 1 tablet (500 mg total) by mouth 2 (two) times daily.   NEXPLANON 68 MG Impl implant Generic drug:  etonogestrel 1 each once by Subdermal route.   oxyCODONE 5 MG immediate  release tablet Commonly known as:  ROXICODONE Take 1 tablet (5 mg total) by mouth every 4 (four) hours as needed for severe pain.   pantoprazole 20 MG tablet Commonly known as:  PROTONIX Take 1 tablet (20 mg total) by mouth daily.   predniSONE 5 MG tablet Commonly known as:  DELTASONE Take 10 tablets (50 mg total) by mouth daily with breakfast. Taper down prednisone starting from 50 mg a day, taper down by 5 mg a day down to 0 mg. for ex, today 50 mg, tomorrow 45 mg, then 40 mg the following day and etc...   promethazine 12.5 MG tablet Commonly known as:  PHENERGAN Take 1 tablet (12.5 mg total) by mouth every 6 (six) hours as needed for nausea or vomiting.            Durable Medical Equipment  (From admission, onward)        Start     Ordered   01/06/17 [phone]  For home use only DME oxygen  Once    Question Answer Comment  Mode or (Route) Nasal cannula   Liters per Minute 6   Oxygen delivery system Gas      01/06/17 0831   01/05/17 1740  For home use only DME oxygen  Once    Question Answer Comment  Mode or (Route) Nasal cannula   Liters per Minute 2   Frequency Continuous (stationary and portable oxygen unit needed)    Oxygen conserving device Yes   Oxygen delivery system Gas      01/05/17 1739     Follow-up Information    Collene Gobble, MD Follow up on 02/07/2017.   Specialty:  Pulmonary Disease Why:  10:30 am Contact information: [address] [place] [address] [phone]            The results of significant diagnostics from this hospitalization (including imaging, microbiology, ancillary and laboratory) are listed below for reference.    Significant Diagnostic Studies: Dg Chest Port 1 View  Result Date: 01/04/2017 CLINICAL DATA:  Asthma exacerbation with shortness of breath. EXAM: PORTABLE CHEST 1 VIEW COMPARISON:  01/03/2017, 11/24/2016 and earlier. FINDINGS: Suboptimal inspiration accounts for crowded bronchovascular markings, bibasilar atelectasis, and accentuates the cardiac silhouette. Taking this into account, cardiomediastinal silhouette unremarkable. Lungs otherwise clear. No visible pleural effusions. IMPRESSION: 1. Suboptimal inspiration which accounts for bibasilar atelectasis. 2.  No acute cardiopulmonary disease otherwise. Electronically Signed   By: Evangeline Dakin M.D.   On: 01/04/2017 08:53   Dg Chest Port 1 View  Result Date: 01/03/2017 CLINICAL DATA:  Shortness of breath EXAM: PORTABLE CHEST 1 VIEW COMPARISON:  Chest radiograph 11/24/2016 FINDINGS: The heart size and mediastinal contours are within normal limits. Both lungs are clear. The visualized skeletal structures are unremarkable. IMPRESSION: No active disease. Electronically Signed   By: Ulyses Jarred M.D.   On: 01/03/2017 04:37    Microbiology: Recent Results (from the past 240 hour(s))  MRSA PCR Screening     Status: None   Collection Time: 01/03/17 11:57 AM  Result Value Ref Range Status   MRSA by PCR NEGATIVE NEGATIVE Final    Comment:        The GeneXpert MRSA Assay (FDA approved for NASAL specimens only), is one component of a comprehensive MRSA colonization surveillance program. It is  not intended to diagnose MRSA infection nor to guide or monitor treatment for MRSA infections.   Respiratory Panel by PCR     Status: None   Collection  Time: 01/03/17  2:33 PM  Result Value Ref Range Status   Adenovirus NOT DETECTED NOT DETECTED Final   Coronavirus 229E NOT DETECTED NOT DETECTED Final   Coronavirus HKU1 NOT DETECTED NOT DETECTED Final   Coronavirus NL63 NOT DETECTED NOT DETECTED Final   Coronavirus OC43 NOT DETECTED NOT DETECTED Final   Metapneumovirus NOT DETECTED NOT DETECTED Final   Rhinovirus / Enterovirus NOT DETECTED NOT DETECTED Final   Influenza A NOT DETECTED NOT DETECTED Final   Influenza B NOT DETECTED NOT DETECTED Final   Parainfluenza Virus 1 NOT DETECTED NOT DETECTED Final   Parainfluenza Virus 2 NOT DETECTED NOT DETECTED Final   Parainfluenza Virus 3 NOT DETECTED NOT DETECTED Final   Parainfluenza Virus 4 NOT DETECTED NOT DETECTED Final   Respiratory Syncytial Virus NOT DETECTED NOT DETECTED Final   Bordetella pertussis NOT DETECTED NOT DETECTED Final   Chlamydophila pneumoniae NOT DETECTED NOT DETECTED Final   Mycoplasma pneumoniae NOT DETECTED NOT DETECTED Final    Comment: Performed at [facility], [place] [address], [place], [place] [address]     Labs: Basic Metabolic Panel: Recent Labs  Lab 01/03/17 0416 01/04/17 0250 01/05/17 0321  NA 138 138 136  K 3.4* 3.3* 3.0*  CL 109 106 101  CO2 25 23 21*  GLUCOSE 91 109* 78  BUN 14 7 13   CREATININE 0.73 0.41* 0.62  CALCIUM 8.5* 9.3 9.4   Liver Function Tests: No results for input(s): AST, ALT, ALKPHOS, BILITOT, PROT, ALBUMIN in the last 168 hours. No results for input(s): LIPASE, AMYLASE in the last 168 hours. No results for input(s): AMMONIA in the last 168 hours. CBC: Recent Labs  Lab 01/03/17 0416 01/04/17 0250 01/05/17 0321  WBC 20.3* 28.1* 26.6*  NEUTROABS 11.0*  --   --   HGB 11.4* 10.9* 12.0  HCT 34.2* 32.6* 36.7  MCV 91.7 89.3 91.5  PLT 260 237 250   Cardiac  Enzymes: No results for input(s): CKTOTAL, CKMB, CKMBINDEX, TROPONINI in the last 168 hours. BNP: BNP (last 3 results) No results for input(s): BNP in the last 8760 hours.  ProBNP (last 3 results) No results for input(s): PROBNP in the last 8760 hours.  CBG: Recent Labs  Lab 01/05/17 0933  GLUCAP 100*

## 2017-01-06 NOTE — Discharge Instructions (Addendum)
Please call if any concerns after discharge: [phone], Jasmine Ibarra MD   Asthma, Adult Asthma is a recurring condition in which the airways tighten and narrow. Asthma can make it difficult to breathe. It can cause coughing, wheezing, and shortness of breath. Asthma episodes, also called asthma attacks, range from minor to life-threatening. Asthma cannot be cured, but medicines and lifestyle changes can help control it. What are the causes? Asthma is believed to be caused by inherited (genetic) and environmental factors, but its exact cause is unknown. Asthma may be triggered by allergens, lung infections, or irritants in the air. Asthma triggers are different for each person. Common triggers include:  Animal dander.  Dust mites.  Cockroaches.  Pollen from trees or grass.  Mold.  Smoke.  Air pollutants such as dust, household cleaners, hair sprays, aerosol sprays, paint fumes, strong chemicals, or strong odors.  Cold air, weather changes, and winds (which increase molds and pollens in the air).  Strong emotional expressions such as crying or laughing hard.  Stress.  Certain medicines (such as aspirin) or types of drugs (such as beta-blockers).  Sulfites in foods and drinks. Foods and drinks that may contain sulfites include dried fruit, potato chips, and sparkling grape juice.  Infections or inflammatory conditions such as the flu, a cold, or an inflammation of the nasal membranes (rhinitis).  Gastroesophageal reflux disease (GERD).  Exercise or strenuous activity.  What are the signs or symptoms? Symptoms may occur immediately after asthma is triggered or many hours later. Symptoms include:  Wheezing.  Excessive nighttime or early morning coughing.  Frequent or severe coughing with a common cold.  Chest tightness.  Shortness of breath.  How is this diagnosed? The diagnosis of asthma is made by a review of your medical history and a physical exam. Tests may also  be performed. These may include:  Lung function studies. These tests show how much air you breathe in and out.  Allergy tests.  Imaging tests such as X-rays.  How is this treated? Asthma cannot be cured, but it can usually be controlled. Treatment involves identifying and avoiding your asthma triggers. It also involves medicines. There are 2 classes of medicine used for asthma treatment:  Controller medicines. These prevent asthma symptoms from occurring. They are usually taken every day.  Reliever or rescue medicines. These quickly relieve asthma symptoms. They are used as needed and provide short-term relief.  Your health care provider will help you create an asthma action plan. An asthma action plan is a written plan for managing and treating your asthma attacks. It includes a list of your asthma triggers and how they may be avoided. It also includes information on when medicines should be taken and when their dosage should be changed. An action plan may also involve the use of a device called a peak flow meter. A peak flow meter measures how well the lungs are working. It helps you monitor your condition. Follow these instructions at home:  Take medicines only as directed by your health care provider. Speak with your health care provider if you have questions about how or when to take the medicines.  Use a peak flow meter as directed by your health care provider. Record and keep track of readings.  Understand and use the action plan to help minimize or stop an asthma attack without needing to seek medical care.  Control your home environment in the following ways to help prevent asthma attacks: ? Do not smoke. Avoid being exposed to  secondhand smoke. ? Change your heating and air conditioning filter regularly. ? Limit your use of fireplaces and wood stoves. ? Get rid of pests (such as roaches and mice) and their droppings. ? Throw away plants if you see mold on them. ? Clean your  floors and dust regularly. Use unscented cleaning products. ? Try to have someone else vacuum for you regularly. Stay out of rooms while they are being vacuumed and for a short while afterward. If you vacuum, use a dust mask from a hardware store, a double-layered or microfilter vacuum cleaner bag, or a vacuum cleaner with a HEPA filter. ? Replace carpet with wood, tile, or vinyl flooring. Carpet can trap dander and dust. ? Use allergy-proof pillows, mattress covers, and box spring covers. ? Wash bed sheets and blankets every week in hot water and dry them in a dryer. ? Use blankets that are made of polyester or cotton. ? Clean bathrooms and kitchens with bleach. If possible, have someone repaint the walls in these rooms with mold-resistant paint. Keep out of the rooms that are being cleaned and painted. ? Wash hands frequently. Contact a health care provider if:  You have wheezing, shortness of breath, or a cough even if taking medicine to prevent attacks.  The colored mucus you cough up (sputum) is thicker than usual.  Your sputum changes from clear or white to yellow, green, gray, or bloody.  You have any problems that may be related to the medicines you are taking (such as a rash, itching, swelling, or trouble breathing).  You are using a reliever medicine more than 2-3 times per week.  Your peak flow is still at 50-79% of your personal best after following your action plan for 1 hour.  You have a fever. Get help right away if:  You seem to be getting worse and are unresponsive to treatment during an asthma attack.  You are short of breath even at rest.  You get short of breath when doing very little physical activity.  You have difficulty eating, drinking, or talking due to asthma symptoms.  You develop chest pain.  You develop a fast heartbeat.  You have a bluish color to your lips or fingernails.  You are light-headed, dizzy, or faint.  Your peak flow is less than 50%  of your personal best. This information is not intended to replace advice given to you by your health care provider. Make sure you discuss any questions you have with your health care provider. Document Released: 01/01/2005 Document Revised: 06/15/2015 Document Reviewed: 07/31/2012 Elsevier Interactive Patient Education  2017 [employer].

## 2017-01-06 NOTE — Care Management Note (Signed)
Case Management Note  Patient Details  Name: Jasmine [name] MRN: [id number] Date of Birth: 05-Oct-1990  Subjective/Objective:   Acute on chronic asthma, exacerbation                 Action/Plan: Discharge Planning: NCM spoke to pt and she has a neb machine. Contacted [facility] for oxygen for home. [facility] will deliver portable tank to room and concentrator to her home. Has an appt to follow up with Dr Lamonte Sakai on 02/07/2017.   Expected Discharge Date:  01/06/17               Expected Discharge Plan:  Home/Self Care  In-House Referral:  NA  Discharge planning Services  CM Consult  Post Acute Care Choice:  NA Choice offered to:  NA  DME Arranged:  Oxygen DME Agency:  [place]:  NA [place] Agency:  NA  Status of Service:  Completed, signed off  If discussed at [place] of Stay Meetings, dates discussed:    Additional Comments:  Erenest Rasher, RN 01/06/2017, 10:17 AM

## 2017-01-29 ENCOUNTER — Emergency Department (HOSPITAL_COMMUNITY): Payer: Medicaid Other

## 2017-01-29 ENCOUNTER — Encounter (HOSPITAL_COMMUNITY): Payer: Self-pay

## 2017-01-29 ENCOUNTER — Other Ambulatory Visit: Payer: Self-pay

## 2017-01-29 ENCOUNTER — Emergency Department (HOSPITAL_COMMUNITY)
Admission: EM | Admit: 2017-01-29 | Discharge: 2017-01-29 | Discharge status: Against Medical Advice | Payer: Medicaid Other | Attending: Emergency Medicine | Admitting: Emergency Medicine

## 2017-01-29 DIAGNOSIS — R51 Headache: Secondary | ICD-10-CM | POA: Diagnosis not present

## 2017-01-29 DIAGNOSIS — W01198A Fall on same level from slipping, tripping and stumbling with subsequent striking against other object, initial encounter: Secondary | ICD-10-CM | POA: Diagnosis not present

## 2017-01-29 DIAGNOSIS — Y999 Unspecified external cause status: Secondary | ICD-10-CM | POA: Diagnosis not present

## 2017-01-29 DIAGNOSIS — Y92002 Bathroom of unspecified non-institutional (private) residence single-family (private) house as the place of occurrence of the external cause: Secondary | ICD-10-CM | POA: Insufficient documentation

## 2017-01-29 DIAGNOSIS — E876 Hypokalemia: Secondary | ICD-10-CM | POA: Insufficient documentation

## 2017-01-29 DIAGNOSIS — R05 Cough: Secondary | ICD-10-CM | POA: Insufficient documentation

## 2017-01-29 DIAGNOSIS — Z79899 Other long term (current) drug therapy: Secondary | ICD-10-CM | POA: Diagnosis not present

## 2017-01-29 DIAGNOSIS — Y939 Activity, unspecified: Secondary | ICD-10-CM | POA: Diagnosis not present

## 2017-01-29 DIAGNOSIS — Z91040 Latex allergy status: Secondary | ICD-10-CM | POA: Diagnosis not present

## 2017-01-29 DIAGNOSIS — Z91010 Allergy to peanuts: Secondary | ICD-10-CM | POA: Insufficient documentation

## 2017-01-29 DIAGNOSIS — Z87891 Personal history of nicotine dependence: Secondary | ICD-10-CM | POA: Diagnosis not present

## 2017-01-29 DIAGNOSIS — J45909 Unspecified asthma, uncomplicated: Secondary | ICD-10-CM | POA: Insufficient documentation

## 2017-01-29 DIAGNOSIS — R509 Fever, unspecified: Secondary | ICD-10-CM | POA: Diagnosis not present

## 2017-01-29 DIAGNOSIS — S8001XA Contusion of right knee, initial encounter: Secondary | ICD-10-CM

## 2017-01-29 DIAGNOSIS — R0602 Shortness of breath: Secondary | ICD-10-CM | POA: Insufficient documentation

## 2017-01-29 DIAGNOSIS — R569 Unspecified convulsions: Secondary | ICD-10-CM | POA: Insufficient documentation

## 2017-01-29 DIAGNOSIS — S8991XA Unspecified injury of right lower leg, initial encounter: Secondary | ICD-10-CM | POA: Diagnosis present

## 2017-01-29 LAB — BASIC METABOLIC PANEL
Anion gap: 7 (ref 5–15)
BUN: 6 mg/dL (ref 6–20)
CHLORIDE: 105 mmol/L (ref 101–111)
CO2: 22 mmol/L (ref 22–32)
Calcium: 9 mg/dL (ref 8.9–10.3)
Creatinine, Ser: 0.66 mg/dL (ref 0.44–1.00)
GFR calc Af Amer: >60 mL/min (ref >60)
GFR calc non Af Amer: >60 mL/min (ref >60)
GLUCOSE: 80 mg/dL (ref 65–99)
POTASSIUM: 2.9 mmol/L — AB (ref 3.5–5.1)
Sodium: 134 mmol/L — ABNORMAL LOW (ref 135–145)

## 2017-01-29 LAB — I-STAT BETA HCG BLOOD, ED (MC, WL, AP ONLY): I-stat hCG, quantitative: <5 m[IU]/mL (ref <5)

## 2017-01-29 LAB — CBC
HEMATOCRIT: 37 % (ref 36.0–46.0)
Hemoglobin: 12.3 g/dL (ref 12.0–15.0)
MCH: 30.1 pg (ref 26.0–34.0)
MCHC: 33.2 g/dL (ref 30.0–36.0)
MCV: 90.7 fL (ref 78.0–100.0)
Platelets: 264 10*3/uL (ref 150–400)
RBC: 4.08 MIL/uL (ref 3.87–5.11)
RDW: 15.1 % (ref 11.5–15.5)
WBC: 10.1 10*3/uL (ref 4.0–10.5)

## 2017-01-29 LAB — CBG MONITORING, ED: Glucose-Capillary: 81 mg/dL (ref 65–99)

## 2017-01-29 LAB — MAGNESIUM: MAGNESIUM: 1.9 mg/dL (ref 1.7–2.4)

## 2017-01-29 MED ORDER — POTASSIUM CHLORIDE CRYS ER 20 MEQ PO TBCR: 40.0000 meq | EXTENDED_RELEASE_TABLET | Freq: Once | ORAL | Status: DC

## 2017-01-29 NOTE — ED Provider Notes (Signed)
[facility] DEPT Provider Note   CSN: [id number] Arrival date & time: 01/29/17  1705     History   Chief Complaint Chief Complaint  Patient presents with  . Knee Pain    HPI Jasmine [name] is a 27 y.o. female.  HPI  27 year old female with a history of epilepsy, asthma, and a recent diagnosis of cystic fibrosis presents with right knee pain.  She states she was going to the bathroom and then woke up on the bathroom floor in between the tub in the toilet.  She is not sure if she passed out or what happened and has no memory of the event.  She states this is similar to prior seizures.  She states she has a chronic headache that is not worse than typical.  She has chronic cough and shortness of breath and is on oxygen but none of these are worse or different than typical.  She denies urinary complaints.  No abdominal pain or vomiting.  She injured her right medial knee and has a bruise there.  It is very painful to touch and lift her leg.  No new weakness.  She states she has had a low-grade fever over the last couple days.  She is allergic to children's Tylenol and ibuprofen but not the adult versions.  She took some Tylenol prior to arrival.  Past Medical History:  Diagnosis Date  . Asthma   . Complication of anesthesia    "I wake up during anesthesia" (10/14/2015)  . DVT (deep vein thrombosis) in pregnancy ([place])   . Epilepsy ([place])   . GERD (gastroesophageal reflux disease)   . Heart murmur    last work up age 54- no symtoms  . Pancreatitis   . Seizures ([place])    epilepsy  . Sickle cell trait ([place])   . Sleep apnea    "used to wear CPAP; don't have one here in [place] since I moved in 2014" (10/14/2015)    Patient Active Problem List   Diagnosis Date Noted  . Colitis 11/12/2016  . Seizure ([place]) 11/12/2016  . Calculus of bile duct with acute cholecystitis with obstruction 10/14/2015  . Adjustment disorder with mixed anxiety and depressed mood 04/26/2015   . Low blood potassium 04/19/2015  . Asthma with acute exacerbation 04/19/2015  . Asthma 01/03/2015  . Asthma exacerbation 01/03/2015  . Influenza-like illness 01/03/2015  . Status post cesarean section 11/08/2014  . Previous cesarean section complicating pregnancy, antepartum condition or complication   . GERD (gastroesophageal reflux disease)   . Gastroesophageal reflux disease without esophagitis   . Abnormal biochemical finding on antenatal screening of mother   . Abnormal quad screen   . Echogenic focus of heart of fetus affecting antepartum care of mother   . Drug use affecting pregnancy, antepartum 05/19/2014  . Seizure disorder during pregnancy, antepartum ([place]) 05/13/2014  . Supervision of high risk pregnancy, antepartum 05/13/2014  . Asthma complicating pregnancy, antepartum 05/13/2014  . History of food anaphylaxis 05/13/2014  . Seizure disorder, sept 2015 last seizure 01/19/2013    Past Surgical History:  Procedure Laterality Date  . APPENDECTOMY    . CESAREAN SECTION  2013  . CESAREAN SECTION N/A 11/08/2014   Procedure: CESAREAN SECTION;  Surgeon: Truett Mainland, DO;  Location: [facility];  Service: Obstetrics;  Laterality: N/A;  . CHOLECYSTECTOMY N/A 10/16/2015   Procedure: LAPAROSCOPIC CHOLECYSTECTOMY WITH POSSIBLE INTRAOPERATIVE CHOLANGIOGRAM;  Surgeon: Donnie Mesa, MD;  Location: [facility];  Service: General;  Laterality: N/A;  .  ESOPHAGOGASTRODUODENOSCOPY (EGD) WITH PROPOFOL N/A 11/15/2016   Procedure: ESOPHAGOGASTRODUODENOSCOPY (EGD) WITH PROPOFOL;  Surgeon: Clarene Essex, MD;  Location: [facility];  Service: Endoscopy;  Laterality: N/A;  . INGUINAL HERNIA REPAIR Bilateral ~ 1996  . NERVE, TENDON AND ARTERY REPAIR Right 09/23/2012   Procedure: I&D and Repair As Necessary/Right Hand and Palm;  Surgeon: Roseanne Kaufman, MD;  Location: [facility];  Service: Orthopedics;  Laterality: Right;  . TONSILLECTOMY      OB History    Gravida Para Term Preterm AB Living   3 3 3     3     SAB TAB Ectopic Multiple Live Births         0 3       Home Medications    Prior to Admission medications   Medication Sig Start Date End Date Taking? Authorizing Provider  acetaminophen (TYLENOL) 325 MG tablet Take 325 mg by mouth every 6 (six) hours as needed for moderate pain.   Yes [provider]  albuterol (PROVENTIL HFA;VENTOLIN HFA) 108 (90 Base) MCG/ACT inhaler Inhale 2 puffs into the lungs every 4 (four) hours as needed for wheezing or shortness of breath. 01/06/17  Yes Robbie Lis, MD  etonogestrel (NEXPLANON) 68 MG IMPL implant 1 each once by Subdermal route.   Yes [provider]  fluticasone-salmeterol (ADVAIR HFA) 115-21 MCG/ACT inhaler Inhale 2 puffs into the lungs 2 (two) times daily. 01/06/17  Yes Robbie Lis, MD  ipratropium-albuterol (DUONEB) 0.5-2.5 (3) MG/3ML SOLN Take 3 mLs by nebulization every 4 (four) hours as needed. 01/06/17 02/05/17 Yes Robbie Lis, MD  levETIRAcetam (KEPPRA) 500 MG tablet Take 1 tablet (500 mg total) by mouth 2 (two) times daily. 01/06/17  Yes Robbie Lis, MD  oxyCODONE (ROXICODONE) 5 MG immediate release tablet Take 1 tablet (5 mg total) by mouth every 4 (four) hours as needed for severe pain. 06/23/16  Yes Nona Dell, PA-C  pantoprazole (PROTONIX) 20 MG tablet Take 1 tablet (20 mg total) by mouth daily. 01/06/17  Yes Robbie Lis, MD  promethazine (PHENERGAN) 12.5 MG tablet Take 1 tablet (12.5 mg total) by mouth every 6 (six) hours as needed for nausea or vomiting. 01/06/17  Yes Robbie Lis, MD  predniSONE (DELTASONE) 5 MG tablet Take 10 tablets (50 mg total) by mouth daily with breakfast. Taper down prednisone starting from 50 mg a day, taper down by 5 mg a day down to 0 mg. for ex, today 50 mg, tomorrow 45 mg, then 40 mg the following day and etc... Patient not taking: Reported on 01/29/2017 01/06/17   Robbie Lis, MD    Family History Family History  Problem Relation Age of Onset  . Cancer  Mother        cervical cancer  . Asthma Mother   . Hypertension Father   . Sickle cell anemia Father   . Asthma Sister   . Diabetes Maternal Aunt   . Cancer Maternal Grandmother     Social History Social History   Tobacco Use  . Smoking status: Former Smoker    Packs/day: 0.25    Years: 4.00    Pack years: 1.00    Types: Cigarettes    Last attempt to quit: 10/02/2015    Years since quitting: 1.3  . Smokeless tobacco: Never Used  Substance Use Topics  . Alcohol use: No    Alcohol/week: 0.0 oz    Frequency: Never  . Drug use: No     Allergies  Cheese; Chocolate; Ibuprofen; Ivp dye [iodinated diagnostic agents]; Latex; Orange juice [orange oil]; Other; Peach [prunus persica]; Peanuts [peanut oil]; Pear; Prednisone; Raspberry; Tylenol [acetaminophen]; Amoxicillin; Apricot flavor; Doxycycline; Erythromycin; Milk of magnesia [magnesium hydroxide]; and Penicillins   Review of Systems Review of Systems  Constitutional: Positive for fever (subjective, low grade).  Respiratory: Positive for cough and shortness of breath.   Gastrointestinal: Negative for abdominal pain and vomiting.  Genitourinary: Negative for dysuria.  Neurological: Positive for seizures, syncope and headaches (chronic). Negative for weakness and numbness.  All other systems reviewed and are negative.    Physical Exam Updated Vital Signs BP (!) 143/84 (BP Location: Right Arm)   Pulse (!) 112   Temp 99.2 F (37.3 C) (Oral)   Resp 20   Ht 5\' 4"  (1.626 m)   Wt 67.1 kg (148 lb)   LMP 01/09/2017   SpO2 100%   BMI 25.40 kg/m   Physical Exam  Constitutional: She is oriented to person, place, and time. She appears well-developed and well-nourished. No distress.  HENT:  Head: Normocephalic and atraumatic.  Right Ear: External ear normal.  Left Ear: External ear normal.  Nose: Nose normal.  Eyes: EOM are normal. Pupils are equal, round, and reactive to light. Right eye exhibits no discharge. Left eye  exhibits no discharge.  Neck: Neck supple.  Cardiovascular: Normal rate, regular rhythm and normal heart sounds.  Pulses:      Dorsalis pedis pulses are 2+ on the right side, and 2+ on the left side.  Pulmonary/Chest: Effort normal. No respiratory distress. She has no wheezes. She has no rales.  Abdominal: Soft. She exhibits no distension. There is no tenderness.  Musculoskeletal:       Right knee: She exhibits ecchymosis (medial). She exhibits normal range of motion, no swelling, no effusion, no deformity, no erythema and normal alignment. Tenderness found.  Neurological: She is alert and oriented to person, place, and time.  CN 3-12 grossly intact. 5/5 strength in all 4 extremities. Grossly normal sensation. Normal finger to nose.   Skin: Skin is warm and dry. She is not diaphoretic.  Nursing note and vitals reviewed.    ED Treatments / Results  Labs (all labs ordered are listed, but only abnormal results are displayed) Labs Reviewed  BASIC METABOLIC PANEL - Abnormal; Notable for the following components:      Result Value   Sodium 134 (*)    Potassium 2.9 (*)    All other components within normal limits  CBC  MAGNESIUM  URINALYSIS, ROUTINE W REFLEX MICROSCOPIC  CBG MONITORING, ED  I-STAT BETA HCG BLOOD, ED ([facility], [facility], [facility] ONLY)    EKG  EKG Interpretation  Date/Time:  Tuesday January 29 2017 17:38:35 EST Ventricular Rate:  101 PR Interval:    QRS Duration: 79 QT Interval:  343 QTC Calculation: 445 R Axis:   50 Text Interpretation:  Age not entered, assumed to be  27 years old for purpose of ECG interpretation Sinus tachycardia Abnormal R-wave progression, early transition no significant change compared to Dec 2018 Confirmed by Sherwood Gambler [phone]) on 01/29/2017 8:52:38 PM       Radiology Dg Chest 2 View  Result Date: 01/29/2017 CLINICAL DATA:  27 year old female with cough and fever. EXAM: CHEST  2 VIEW COMPARISON:  Chest radiograph dated 01/04/2017 FINDINGS: The  heart size and mediastinal contours are within normal limits. Both lungs are clear. The visualized skeletal structures are unremarkable. IMPRESSION: No active cardiopulmonary disease. Electronically Signed   By:  Anner Crete M.D.   On: 01/29/2017 21:49   Dg Knee Complete 4 Views Right  Result Date: 01/29/2017 CLINICAL DATA:  Per order- fall Pt c/o right knee pain and swelling after a fall today after a syncopal episode. Pt states she is having medial knee pain since fall. Pt states no previous injuries. EXAM: RIGHT KNEE - COMPLETE 4+ VIEW COMPARISON:  None. FINDINGS: No evidence of fracture, dislocation, or joint effusion. No evidence of arthropathy or other focal bone abnormality. Soft tissues are unremarkable. IMPRESSION: Negative. Electronically Signed   By: Lajean Manes M.D.   On: 01/29/2017 20:28    Procedures Procedures (including critical care time)  Medications Ordered in ED Medications  potassium chloride SA (K-DUR,KLOR-CON) CR tablet 40 mEq (not administered)     Initial Impression / Assessment and Plan / ED Course  I have reviewed the triage vital signs and the nursing notes.  Pertinent labs & imaging results that were available during my care of the patient were reviewed by me and considered in my medical decision making (see chart for details).     Tachycardia from triage has resolved.  Lab work is significant for hypokalemia with a potassium of 2.9.  This is similar to multiple priors and she states this is a chronic issue.  However she has stopped seeing her PCP and thus is not currently on potassium.  She likely had a seizure as the cause of her fall given her history of epilepsy.  She states she has been compliant with Keppra.  No head injury.  Her right knee appears to have a mild to moderate contusion but there is no fracture on x-ray and no ligamentous laxity.  Neurovascular intact.  Given her subjective fever at home, urine and chest x-ray also ordered to evaluate for  potential infectious source to increase seizure potential.  Chest x-ray is benign.  However patient eloped prior to giving a urinalysis or receiving hypokalemia.  I was unable to give her discharge instructions.  She otherwise appeared well.  Final Clinical Impressions(s) / ED Diagnoses   Final diagnoses:  Contusion of right knee, initial encounter  Hypokalemia    ED Discharge Orders    None       Sherwood Gambler, MD 01/29/17 2235

## 2017-01-29 NOTE — ED Triage Notes (Addendum)
Pt c/o right knee pain and swelling after a fall today after a syncopal episode. Right knee is swollen and red. Pt unable to explain what happened, does not remember. A/Ox4. Pt states shes being treated for possible cystic fibrosis and wears 2 l/min [place]/ 4L/min [place] walking.

## 2017-01-29 NOTE — ED Notes (Signed)
Pt left without receiving medications or discharge paperwork

## 2017-02-07 ENCOUNTER — Inpatient Hospital Stay: Payer: Self-pay | Admitting: Emergency Medicine

## 2017-02-12 ENCOUNTER — Inpatient Hospital Stay: Payer: Self-pay | Admitting: Adult Health

## 2017-02-13 ENCOUNTER — Other Ambulatory Visit: Payer: Self-pay

## 2017-02-13 ENCOUNTER — Encounter (HOSPITAL_COMMUNITY): Payer: Self-pay

## 2017-02-13 DIAGNOSIS — Z5321 Procedure and treatment not carried out due to patient leaving prior to being seen by health care provider: Secondary | ICD-10-CM | POA: Diagnosis not present

## 2017-02-13 DIAGNOSIS — R0602 Shortness of breath: Secondary | ICD-10-CM | POA: Diagnosis not present

## 2017-02-13 LAB — CBC
HEMATOCRIT: 40 % (ref 36.0–46.0)
HEMOGLOBIN: 13.2 g/dL (ref 12.0–15.0)
MCH: 30.4 pg (ref 26.0–34.0)
MCHC: 33 g/dL (ref 30.0–36.0)
MCV: 92.2 fL (ref 78.0–100.0)
Platelets: 321 10*3/uL (ref 150–400)
RBC: 4.34 MIL/uL (ref 3.87–5.11)
RDW: 14.5 % (ref 11.5–15.5)
WBC: 9.8 10*3/uL (ref 4.0–10.5)

## 2017-02-13 LAB — BASIC METABOLIC PANEL
ANION GAP: 7 (ref 5–15)
BUN: 9 mg/dL (ref 6–20)
CHLORIDE: 106 mmol/L (ref 101–111)
CO2: 23 mmol/L (ref 22–32)
Calcium: 9.9 mg/dL (ref 8.9–10.3)
Creatinine, Ser: 0.69 mg/dL (ref 0.44–1.00)
GFR calc Af Amer: >60 mL/min (ref >60)
GLUCOSE: 87 mg/dL (ref 65–99)
POTASSIUM: 3.9 mmol/L (ref 3.5–5.1)
Sodium: 136 mmol/L (ref 135–145)

## 2017-02-13 LAB — I-STAT BETA HCG BLOOD, ED (MC, WL, AP ONLY)

## 2017-02-13 LAB — CBG MONITORING, ED: Glucose-Capillary: 86 mg/dL (ref 65–99)

## 2017-02-13 NOTE — ED Triage Notes (Signed)
States for one week c/o weakness and right breast with clear drainage for one month with foul smell and diarrhea.

## 2017-02-13 NOTE — ED Notes (Signed)
Placed pt in wheelchair and put back in lobby.

## 2017-02-14 ENCOUNTER — Emergency Department (HOSPITAL_COMMUNITY): Payer: Medicaid Other

## 2017-02-14 ENCOUNTER — Other Ambulatory Visit: Payer: Self-pay

## 2017-02-14 ENCOUNTER — Encounter (HOSPITAL_COMMUNITY): Payer: Self-pay

## 2017-02-14 ENCOUNTER — Emergency Department (HOSPITAL_COMMUNITY)
Admission: EM | Admit: 2017-02-14 | Discharge: 2017-02-14 | Disposition: A | Discharge status: Home / Self Care | Payer: Medicaid Other | Source: Home / Self Care | Attending: Emergency Medicine | Admitting: Emergency Medicine

## 2017-02-14 ENCOUNTER — Emergency Department (HOSPITAL_COMMUNITY)
Admission: EM | Admit: 2017-02-14 | Discharge: 2017-02-14 | Disposition: A | Discharge status: Home / Self Care | Payer: Medicaid Other | Attending: Emergency Medicine | Admitting: Emergency Medicine

## 2017-02-14 DIAGNOSIS — R0602 Shortness of breath: Secondary | ICD-10-CM | POA: Diagnosis not present

## 2017-02-14 HISTORY — DX: Cystic fibrosis, unspecified: E84.9

## 2017-02-14 LAB — COMPREHENSIVE METABOLIC PANEL
ALT: 16 U/L (ref 14–54)
ANION GAP: 4 — AB (ref 5–15)
AST: 23 U/L (ref 15–41)
Albumin: 3.6 g/dL (ref 3.5–5.0)
Alkaline Phosphatase: 44 U/L (ref 38–126)
BUN: 8 mg/dL (ref 6–20)
CALCIUM: 7.7 mg/dL — AB (ref 8.9–10.3)
CHLORIDE: 115 mmol/L — AB (ref 101–111)
CO2: 20 mmol/L — AB (ref 22–32)
Creatinine, Ser: 0.63 mg/dL (ref 0.44–1.00)
GFR calc non Af Amer: >60 mL/min (ref >60)
Glucose, Bld: 75 mg/dL (ref 65–99)
POTASSIUM: 3.7 mmol/L (ref 3.5–5.1)
SODIUM: 139 mmol/L (ref 135–145)
Total Bilirubin: 0.4 mg/dL (ref 0.3–1.2)
Total Protein: 5.8 g/dL — ABNORMAL LOW (ref 6.5–8.1)

## 2017-02-14 LAB — CBC WITH DIFFERENTIAL/PLATELET
Basophils Absolute: 0 10*3/uL (ref 0.0–0.1)
Basophils Relative: 0 %
EOS ABS: 0.1 10*3/uL (ref 0.0–0.7)
EOS PCT: 2 %
HCT: 40.3 % (ref 36.0–46.0)
Hemoglobin: 13.4 g/dL (ref 12.0–15.0)
Lymphocytes Relative: 35 %
Lymphs Abs: 3.1 10*3/uL (ref 0.7–4.0)
MCH: 30.1 pg (ref 26.0–34.0)
MCHC: 33.3 g/dL (ref 30.0–36.0)
MCV: 90.6 fL (ref 78.0–100.0)
MONOS PCT: 4 %
Monocytes Absolute: 0.3 10*3/uL (ref 0.1–1.0)
Neutro Abs: 5.2 10*3/uL (ref 1.7–7.7)
Neutrophils Relative %: 59 %
PLATELETS: 354 10*3/uL (ref 150–400)
RBC: 4.45 MIL/uL (ref 3.87–5.11)
RDW: 14.7 % (ref 11.5–15.5)
WBC: 8.8 10*3/uL (ref 4.0–10.5)

## 2017-02-14 MED ORDER — SODIUM CHLORIDE 0.9 % IV SOLN: INTRAVENOUS | Status: DC

## 2017-02-14 MED ORDER — SODIUM CHLORIDE 0.9 % IV BOLUS (SEPSIS)
1000.0000 mL | Freq: Once | INTRAVENOUS | Status: AC
  Administered 2017-02-14: 1000 mL via INTRAVENOUS

## 2017-02-14 NOTE — ED Provider Notes (Addendum)
[facility] DEPT Provider Note   CSN: [id number] Arrival date & time: 02/14/17  1748     History   Chief Complaint Chief Complaint  Patient presents with  . Tachycardia  . breast drainage  . Diarrhea  . Abdominal Pain    HPI Jasmine [name] is a 27 y.o. female.  27 year old female with history of cystic fibrosis presents with increasing shortness of breath and productive cough along with left-sided chest pain.  Her pain is persistent and not pleuritic.  No fever or chills.  Has noted some clear drainage from her right nipple without surrounding infection.  Has had mucousy stools but denies any blood per rectum.  No abdominal discomfort.  No treatment used prior to arrival for this      Past Medical History:  Diagnosis Date  . Asthma   . Complication of anesthesia    "I wake up during anesthesia" (10/14/2015)  . Cystic fibrosis ([place])   . DVT (deep vein thrombosis) in pregnancy ([place])   . Epilepsy ([place])   . GERD (gastroesophageal reflux disease)   . Heart murmur    last work up age 75- no symtoms  . Pancreatitis   . Seizures ([place])    epilepsy  . Sickle cell trait ([place])   . Sleep apnea    "used to wear CPAP; don't have one here in [place] since I moved in 2014" (10/14/2015)    Patient Active Problem List   Diagnosis Date Noted  . Colitis 11/12/2016  . Seizure ([place]) 11/12/2016  . Calculus of bile duct with acute cholecystitis with obstruction 10/14/2015  . Adjustment disorder with mixed anxiety and depressed mood 04/26/2015  . Low blood potassium 04/19/2015  . Asthma with acute exacerbation 04/19/2015  . Asthma 01/03/2015  . Asthma exacerbation 01/03/2015  . Influenza-like illness 01/03/2015  . Status post cesarean section 11/08/2014  . Previous cesarean section complicating pregnancy, antepartum condition or complication   . GERD (gastroesophageal reflux disease)   . Gastroesophageal reflux disease without esophagitis   . Abnormal  biochemical finding on antenatal screening of mother   . Abnormal quad screen   . Echogenic focus of heart of fetus affecting antepartum care of mother   . Drug use affecting pregnancy, antepartum 05/19/2014  . Seizure disorder during pregnancy, antepartum ([place]) 05/13/2014  . Supervision of high risk pregnancy, antepartum 05/13/2014  . Asthma complicating pregnancy, antepartum 05/13/2014  . History of food anaphylaxis 05/13/2014  . Seizure disorder, sept 2015 last seizure 01/19/2013    Past Surgical History:  Procedure Laterality Date  . APPENDECTOMY    . CESAREAN SECTION  2013  . CESAREAN SECTION N/A 11/08/2014   Procedure: CESAREAN SECTION;  Surgeon: Truett Mainland, DO;  Location: [facility];  Service: Obstetrics;  Laterality: N/A;  . CHOLECYSTECTOMY N/A 10/16/2015   Procedure: LAPAROSCOPIC CHOLECYSTECTOMY WITH POSSIBLE INTRAOPERATIVE CHOLANGIOGRAM;  Surgeon: Donnie Mesa, MD;  Location: [facility];  Service: General;  Laterality: N/A;  . ESOPHAGOGASTRODUODENOSCOPY (EGD) WITH PROPOFOL N/A 11/15/2016   Procedure: ESOPHAGOGASTRODUODENOSCOPY (EGD) WITH PROPOFOL;  Surgeon: Clarene Essex, MD;  Location: [facility];  Service: Endoscopy;  Laterality: N/A;  . INGUINAL HERNIA REPAIR Bilateral ~ 1996  . NERVE, TENDON AND ARTERY REPAIR Right 09/23/2012   Procedure: I&D and Repair As Necessary/Right Hand and Palm;  Surgeon: Roseanne Kaufman, MD;  Location: [facility];  Service: Orthopedics;  Laterality: Right;  . TONSILLECTOMY      OB History    Gravida Para Term Preterm AB Living  3 3 3     3    SAB TAB Ectopic Multiple Live Births         0 3       Home Medications    Prior to Admission medications   Medication Sig Start Date End Date Taking? Authorizing Provider  acetaminophen (TYLENOL) 325 MG tablet Take 325 mg by mouth every 6 (six) hours as needed for moderate pain.    [provider]  albuterol (PROVENTIL HFA;VENTOLIN HFA) 108 (90 Base) MCG/ACT inhaler Inhale 2 puffs into the lungs every  4 (four) hours as needed for wheezing or shortness of breath. 01/06/17   Robbie Lis, MD  etonogestrel (NEXPLANON) 68 MG IMPL implant 1 each once by Subdermal route.    [provider]  fluticasone-salmeterol (ADVAIR HFA) 115-21 MCG/ACT inhaler Inhale 2 puffs into the lungs 2 (two) times daily. 01/06/17   Robbie Lis, MD  ipratropium-albuterol (DUONEB) 0.5-2.5 (3) MG/3ML SOLN Take 3 mLs by nebulization every 4 (four) hours as needed. 01/06/17 02/05/17  Robbie Lis, MD  levETIRAcetam (KEPPRA) 500 MG tablet Take 1 tablet (500 mg total) by mouth 2 (two) times daily. 01/06/17   Robbie Lis, MD  oxyCODONE (ROXICODONE) 5 MG immediate release tablet Take 1 tablet (5 mg total) by mouth every 4 (four) hours as needed for severe pain. 06/23/16   Nona Dell, PA-C  pantoprazole (PROTONIX) 20 MG tablet Take 1 tablet (20 mg total) by mouth daily. 01/06/17   Robbie Lis, MD  predniSONE (DELTASONE) 5 MG tablet Take 10 tablets (50 mg total) by mouth daily with breakfast. Taper down prednisone starting from 50 mg a day, taper down by 5 mg a day down to 0 mg. for ex, today 50 mg, tomorrow 45 mg, then 40 mg the following day and etc... Patient not taking: Reported on 01/29/2017 01/06/17   Robbie Lis, MD  promethazine (PHENERGAN) 12.5 MG tablet Take 1 tablet (12.5 mg total) by mouth every 6 (six) hours as needed for nausea or vomiting. 01/06/17   Robbie Lis, MD    Family History Family History  Problem Relation Age of Onset  . Cancer Mother        cervical cancer  . Asthma Mother   . Hypertension Father   . Sickle cell anemia Father   . Asthma Sister   . Diabetes Maternal Aunt   . Cancer Maternal Grandmother     Social History Social History   Tobacco Use  . Smoking status: Former Smoker    Packs/day: 0.25    Years: 4.00    Pack years: 1.00    Types: Cigarettes    Last attempt to quit: 10/02/2015    Years since quitting: 1.3  . Smokeless tobacco: Never Used    Substance Use Topics  . Alcohol use: No    Alcohol/week: 0.0 oz    Frequency: Never  . Drug use: No     Allergies   Cheese; Chocolate; Ibuprofen; Ivp dye [iodinated diagnostic agents]; Latex; Orange juice [orange oil]; Other; Peach [prunus persica]; Peanuts [peanut oil]; Pear; Prednisone; Raspberry; Tylenol [acetaminophen]; Amoxicillin; Apricot flavor; Doxycycline; Erythromycin; Milk of magnesia [magnesium hydroxide]; and Penicillins   Review of Systems Review of Systems  All other systems reviewed and are negative.    Physical Exam Updated Vital Signs BP (!) 149/126 (BP Location: Right Arm)   Pulse (!) 119   Temp 98.5 F (36.9 C) (Oral)   Resp 20   Ht 1.626  m (5\' 4" )   Wt 67.1 kg (148 lb)   SpO2 100%   BMI 25.40 kg/m   Physical Exam  Constitutional: She is oriented to person, place, and time. She appears well-developed and well-nourished.  Non-toxic appearance. No distress.  HENT:  Head: Normocephalic and atraumatic.  Eyes: Conjunctivae, EOM and lids are normal. Pupils are equal, round, and reactive to light.  Neck: Normal range of motion. Neck supple. No tracheal deviation present. No thyroid mass present.  Cardiovascular: Normal rate, regular rhythm and normal heart sounds. Exam reveals no gallop.  No murmur heard. Pulmonary/Chest: Effort normal. No stridor. No respiratory distress. She has decreased breath sounds in the right lower field and the left lower field. She has no wheezes. She has no rhonchi. She has rales in the right lower field and the left lower field.  Abdominal: Soft. Normal appearance and bowel sounds are normal. She exhibits no distension. There is no tenderness. There is no rebound and no CVA tenderness.  Musculoskeletal: Normal range of motion. She exhibits no edema or tenderness.  Neurological: She is alert and oriented to person, place, and time. She has normal strength. No cranial nerve deficit or sensory deficit. GCS eye subscore is 4. GCS  verbal subscore is 5. GCS motor subscore is 6.  Skin: Skin is warm and dry. No abrasion and no rash noted.  Psychiatric: She has a normal mood and affect. Her speech is normal and behavior is normal.  Nursing note and vitals reviewed.    ED Treatments / Results  Labs (all labs ordered are listed, but only abnormal results are displayed) Labs Reviewed  CBC WITH DIFFERENTIAL/PLATELET  COMPREHENSIVE METABOLIC PANEL  URINALYSIS, ROUTINE W REFLEX MICROSCOPIC    EKG  EKG Interpretation  Date/Time:  Thursday February 14 2017 18:25:37 EST Ventricular Rate:  102 PR Interval:    QRS Duration: 69 QT Interval:  367 QTC Calculation: 479 R Axis:   52 Text Interpretation:  Sinus tachycardia Probable left atrial enlargement Borderline T abnormalities, anterior leads Borderline prolonged QT interval Baseline wander in lead(s) III aVL V1 Confirmed by Lacretia Leigh ([id number]) on 02/14/2017 7:04:11 PM       Radiology No results found.  Procedures Procedures (including critical care time)  Medications Ordered in ED Medications  0.9 %  sodium chloride infusion (not administered)  sodium chloride 0.9 % bolus 1,000 mL (not administered)     Initial Impression / Assessment and Plan / ED Course  I have reviewed the triage vital signs and the nursing notes.  Pertinent labs & imaging results that were available during my care of the patient were reviewed by me and considered in my medical decision making (see chart for details).     Patient with evidence of hypocalcemia.  She claims of being weak as well 2.  Would like to monitor patient no longer but she requests to leave due to family emergency.  She was strongly encouraged to return for further treatment   Patient understands instructions Final Clinical Impressions(s) / ED Diagnoses   Final diagnoses:  SOB (shortness of breath)    ED Discharge Orders    None       Lacretia Leigh, MD 02/14/17 2214    Lacretia Leigh,  MD 02/14/17 2221

## 2017-02-14 NOTE — Discharge Instructions (Signed)
You have decided to leave the hospital at this time.  Please return at her earliest convenience for treatment of your low calcium

## 2017-02-14 NOTE — ED Notes (Signed)
Unable to locate pt 3rd call.

## 2017-02-14 NOTE — ED Notes (Signed)
No answer x1

## 2017-02-14 NOTE — ED Triage Notes (Signed)
Patient reports that she has been having 'heart racing and low oxygen level" patient has a history of cystic fibrosis. Patient wears O2 2L/min at rest and O2 4L/min with activity. Patient c/o feeling tired. Patient also reports diarrhea/mucous twice today. Patient states she has a clear drainage from her right nipple x 2 weeks.

## 2017-02-14 NOTE — ED Notes (Signed)
No answer x2 

## 2017-02-14 NOTE — ED Notes (Signed)
Pt walked out into hallway and stated to writer that she needed to leave because her daughter got hurt and she needed to go take her daughter to the hospital. Writer instructed pt to go back into room so that the doc could come and speak with the pt. Zenia Resides, MD notified.

## 2017-02-14 NOTE — ED Notes (Signed)
Pt stated that she is unable to urinate for a sample at this time. Will try back after the fluids have time to run in.

## 2017-02-18 ENCOUNTER — Emergency Department (HOSPITAL_COMMUNITY)
Admission: EM | Admit: 2017-02-18 | Discharge: 2017-02-18 | Disposition: A | Discharge status: Home / Self Care | Payer: Medicaid Other | Attending: Emergency Medicine | Admitting: Emergency Medicine

## 2017-02-18 DIAGNOSIS — Z5321 Procedure and treatment not carried out due to patient leaving prior to being seen by health care provider: Secondary | ICD-10-CM | POA: Diagnosis not present

## 2017-02-18 NOTE — ED Notes (Signed)
Pt stated she did not want to wait, all she needs is a calcium treatment and she can come back for that. Pt left the ED

## 2017-02-18 NOTE — ED Triage Notes (Signed)
Called pt for triage, no response 

## 2017-02-18 NOTE — ED Notes (Signed)
No call again. Per [facility] in lobby patient left.

## 2017-02-19 ENCOUNTER — Encounter: Payer: Self-pay | Admitting: *Deleted

## 2017-02-25 ENCOUNTER — Other Ambulatory Visit: Payer: Self-pay

## 2017-02-25 ENCOUNTER — Encounter (HOSPITAL_COMMUNITY): Payer: Self-pay | Admitting: *Deleted

## 2017-02-25 DIAGNOSIS — J45901 Unspecified asthma with (acute) exacerbation: Secondary | ICD-10-CM | POA: Diagnosis not present

## 2017-02-25 DIAGNOSIS — Z91040 Latex allergy status: Secondary | ICD-10-CM | POA: Diagnosis not present

## 2017-02-25 DIAGNOSIS — Z91010 Allergy to peanuts: Secondary | ICD-10-CM | POA: Diagnosis not present

## 2017-02-25 DIAGNOSIS — Z87891 Personal history of nicotine dependence: Secondary | ICD-10-CM | POA: Diagnosis not present

## 2017-02-25 DIAGNOSIS — M79671 Pain in right foot: Secondary | ICD-10-CM | POA: Insufficient documentation

## 2017-02-25 DIAGNOSIS — Z79899 Other long term (current) drug therapy: Secondary | ICD-10-CM | POA: Diagnosis not present

## 2017-02-25 NOTE — ED Triage Notes (Signed)
Pt has had R foot/great toe pain for the past 2 days. Foot is red and swollen to lateral side and underneath of R foot. Denies injuries; although reports having seizures in which she can't remember if she may have injured foot.

## 2017-02-26 ENCOUNTER — Emergency Department (HOSPITAL_COMMUNITY)
Admission: EM | Admit: 2017-02-26 | Discharge: 2017-02-26 | Disposition: A | Discharge status: Home / Self Care | Payer: Medicaid Other | Attending: Emergency Medicine | Admitting: Emergency Medicine

## 2017-02-26 ENCOUNTER — Emergency Department (HOSPITAL_COMMUNITY): Payer: Medicaid Other

## 2017-02-26 DIAGNOSIS — M79671 Pain in right foot: Secondary | ICD-10-CM

## 2017-02-26 MED ORDER — NAPROXEN 250 MG PO TABS
500.0000 mg | ORAL_TABLET | Freq: Once | ORAL | Status: AC
  Administered 2017-02-26: 500 mg via ORAL
  Filled 2017-02-26: qty 2

## 2017-02-26 MED ORDER — NAPROXEN 375 MG PO TABS: 375.0000 mg | ORAL_TABLET | Freq: Two times a day (BID) | ORAL | 0 refills | Status: DC

## 2017-02-26 NOTE — ED Notes (Signed)
Pt ambulated to restroom independently.

## 2017-02-26 NOTE — Discharge Instructions (Signed)
Follow up with your doctor for recheck this week. Take Naproxen as directed for pain.

## 2017-02-26 NOTE — ED Provider Notes (Signed)
[facility] EMERGENCY DEPARTMENT Provider Note   CSN: [id number] Arrival date & time: 02/25/17  2139     History   Chief Complaint Chief Complaint  Patient presents with  . Foot Pain    HPI Jasmine [name] is a 27 y.o. female.  Patient presents with complaint of right foot pain without known injury. The pain is located to the dorsum and plantar surfaces and extends to great toe. It is not isolated to joints of the foot. She reports swelling. No redness or wound. The pain is worse when walking and better when it is elevated. No lower leg pain, fever.    The history is provided by the patient. No language interpreter was used.    Past Medical History:  Diagnosis Date  . Asthma   . Complication of anesthesia    "I wake up during anesthesia" (10/14/2015)  . Cystic fibrosis ([place])   . DVT (deep vein thrombosis) in pregnancy ([place])   . Epilepsy ([place])   . GERD (gastroesophageal reflux disease)   . Heart murmur    last work up age 35- no symtoms  . Pancreatitis   . Seizures ([place])    epilepsy  . Sickle cell trait ([place])   . Sleep apnea    "used to wear CPAP; don't have one here in [place] since I moved in 2014" (10/14/2015)    Patient Active Problem List   Diagnosis Date Noted  . Colitis 11/12/2016  . Seizure ([place]) 11/12/2016  . Calculus of bile duct with acute cholecystitis with obstruction 10/14/2015  . Adjustment disorder with mixed anxiety and depressed mood 04/26/2015  . Low blood potassium 04/19/2015  . Asthma with acute exacerbation 04/19/2015  . Asthma 01/03/2015  . Asthma exacerbation 01/03/2015  . Influenza-like illness 01/03/2015  . Status post cesarean section 11/08/2014  . Previous cesarean section complicating pregnancy, antepartum condition or complication   . GERD (gastroesophageal reflux disease)   . Gastroesophageal reflux disease without esophagitis   . Abnormal biochemical finding on antenatal screening of mother   . Abnormal quad screen     . Echogenic focus of heart of fetus affecting antepartum care of mother   . Drug use affecting pregnancy, antepartum 05/19/2014  . Seizure disorder during pregnancy, antepartum ([place]) 05/13/2014  . Supervision of high risk pregnancy, antepartum 05/13/2014  . Asthma complicating pregnancy, antepartum 05/13/2014  . History of food anaphylaxis 05/13/2014  . Seizure disorder, sept 2015 last seizure 01/19/2013    Past Surgical History:  Procedure Laterality Date  . APPENDECTOMY    . CESAREAN SECTION  2013  . CESAREAN SECTION N/A 11/08/2014   Procedure: CESAREAN SECTION;  Surgeon: Truett Mainland, DO;  Location: [facility];  Service: Obstetrics;  Laterality: N/A;  . CHOLECYSTECTOMY N/A 10/16/2015   Procedure: LAPAROSCOPIC CHOLECYSTECTOMY WITH POSSIBLE INTRAOPERATIVE CHOLANGIOGRAM;  Surgeon: Donnie Mesa, MD;  Location: [facility];  Service: General;  Laterality: N/A;  . ESOPHAGOGASTRODUODENOSCOPY (EGD) WITH PROPOFOL N/A 11/15/2016   Procedure: ESOPHAGOGASTRODUODENOSCOPY (EGD) WITH PROPOFOL;  Surgeon: Clarene Essex, MD;  Location: [facility];  Service: Endoscopy;  Laterality: N/A;  . INGUINAL HERNIA REPAIR Bilateral ~ 1996  . NERVE, TENDON AND ARTERY REPAIR Right 09/23/2012   Procedure: I&D and Repair As Necessary/Right Hand and Palm;  Surgeon: Roseanne Kaufman, MD;  Location: [facility];  Service: Orthopedics;  Laterality: Right;  . TONSILLECTOMY      OB History    Gravida Para Term Preterm AB Living   3 3 3  3   SAB TAB Ectopic Multiple Live Births         0 3       Home Medications    Prior to Admission medications   Medication Sig Start Date End Date Taking? Authorizing Provider  acetaminophen (TYLENOL) 325 MG tablet Take 325 mg by mouth every 6 (six) hours as needed for moderate pain.    [provider]  albuterol (PROVENTIL HFA;VENTOLIN HFA) 108 (90 Base) MCG/ACT inhaler Inhale 2 puffs into the lungs every 4 (four) hours as needed for wheezing or shortness of breath. 01/06/17    Robbie Lis, MD  etonogestrel (NEXPLANON) 68 MG IMPL implant 1 each once by Subdermal route.    [provider]  fluticasone-salmeterol (ADVAIR HFA) 115-21 MCG/ACT inhaler Inhale 2 puffs into the lungs 2 (two) times daily. 01/06/17   Robbie Lis, MD  ipratropium-albuterol (DUONEB) 0.5-2.5 (3) MG/3ML SOLN Take 3 mLs by nebulization every 4 (four) hours as needed. 01/06/17 02/14/17  Robbie Lis, MD  levETIRAcetam (KEPPRA) 500 MG tablet Take 1 tablet (500 mg total) by mouth 2 (two) times daily. 01/06/17   Robbie Lis, MD  oxyCODONE (ROXICODONE) 5 MG immediate release tablet Take 1 tablet (5 mg total) by mouth every 4 (four) hours as needed for severe pain. 06/23/16   Nona Dell, PA-C  pantoprazole (PROTONIX) 20 MG tablet Take 1 tablet (20 mg total) by mouth daily. 01/06/17   Robbie Lis, MD  predniSONE (DELTASONE) 5 MG tablet Take 10 tablets (50 mg total) by mouth daily with breakfast. Taper down prednisone starting from 50 mg a day, taper down by 5 mg a day down to 0 mg. for ex, today 50 mg, tomorrow 45 mg, then 40 mg the following day and etc... Patient not taking: Reported on 01/29/2017 01/06/17   Robbie Lis, MD  promethazine (PHENERGAN) 12.5 MG tablet Take 1 tablet (12.5 mg total) by mouth every 6 (six) hours as needed for nausea or vomiting. 01/06/17   Robbie Lis, MD    Family History Family History  Problem Relation Age of Onset  . Cancer Mother        cervical cancer  . Asthma Mother   . Hypertension Father   . Sickle cell anemia Father   . Asthma Sister   . Diabetes Maternal Aunt   . Cancer Maternal Grandmother     Social History Social History   Tobacco Use  . Smoking status: Former Smoker    Packs/day: 0.25    Years: 4.00    Pack years: 1.00    Types: Cigarettes    Last attempt to quit: 10/02/2015    Years since quitting: 1.4  . Smokeless tobacco: Never Used  Substance Use Topics  . Alcohol use: No    Alcohol/week: 0.0 oz     Frequency: Never  . Drug use: No     Allergies   Cheese; Chocolate; Ibuprofen; Ivp dye [iodinated diagnostic agents]; Latex; Orange juice [orange oil]; Other; Peach [prunus persica]; Peanuts [peanut oil]; Pear; Prednisone; Raspberry; Tylenol [acetaminophen]; Amoxicillin; Apricot flavor; Doxycycline; Erythromycin; Milk of magnesia [magnesium hydroxide]; and Penicillins   Review of Systems Review of Systems  Constitutional: Negative for fever.  Musculoskeletal:       See HPI.  Skin: Negative.  Negative for color change and wound.  Neurological: Negative.  Negative for weakness and numbness.     Physical Exam Updated Vital Signs BP 140/78 (BP Location: Right Arm)   Pulse Marland Kitchen)  115   Temp 99.4 F (37.4 C) (Oral)   Resp 18   LMP 02/07/2017   SpO2 97%   Physical Exam  Constitutional: She is oriented to person, place, and time. She appears well-developed and well-nourished.  Neck: Normal range of motion.  Pulmonary/Chest: Effort normal.  Musculoskeletal:  Right foot is unremarkable in appearance. No swelling, redness, warmth. FROM. Generally tender. No vascular compromise.   Neurological: She is alert and oriented to person, place, and time.  Skin: Skin is warm and dry. No erythema.     ED Treatments / Results  Labs (all labs ordered are listed, but only abnormal results are displayed) Labs Reviewed - No data to display  EKG  EKG Interpretation None       Radiology No results found.  Procedures Procedures (including critical care time)  Medications Ordered in ED Medications - No data to display   Initial Impression / Assessment and Plan / ED Course  I have reviewed the triage vital signs and the nursing notes.  Pertinent labs & imaging results that were available during my care of the patient were reviewed by me and considered in my medical decision making (see chart for details).     Patient presents with right foot pain x 2 days without known injury. No  fever, redness. She reports swelling and difficulty ambulating due to pain.  Examination of the foot shows generalized tenderness without redness, warmth, bony deformity, specific joint pain, or skin changes. Imaging of the foot is negative.   The patient has been ambulatory in the ED without apparent difficulty. Feel she can be discharged home on Naproxen and encouraged to follow up with PCP this week for recheck.   She is found to be tachycardic throughout her ED visit. This is consistent with multiple previous visits and not addressed this encounter.  Final Clinical Impressions(s) / ED Diagnoses   Final diagnoses:  None   1. Right foot pain.  ED Discharge Orders    None       Charlann Lange, Hershal Coria 02/26/17 0601    Ezequiel Essex, MD 02/26/17 [phone]

## 2017-03-01 DIAGNOSIS — E162 Hypoglycemia, unspecified: Secondary | ICD-10-CM | POA: Diagnosis present

## 2017-03-01 DIAGNOSIS — Z5321 Procedure and treatment not carried out due to patient leaving prior to being seen by health care provider: Secondary | ICD-10-CM | POA: Insufficient documentation

## 2017-03-01 DIAGNOSIS — K219 Gastro-esophageal reflux disease without esophagitis: Secondary | ICD-10-CM

## 2017-03-02 ENCOUNTER — Emergency Department (HOSPITAL_COMMUNITY)
Admission: EM | Admit: 2017-03-02 | Discharge: 2017-03-02 | Disposition: A | Discharge status: Home / Self Care | Payer: Medicaid Other | Attending: Emergency Medicine | Admitting: Emergency Medicine

## 2017-03-02 ENCOUNTER — Encounter (HOSPITAL_COMMUNITY): Payer: Self-pay | Admitting: Nurse Practitioner

## 2017-03-02 NOTE — ED Notes (Signed)
Called to take to room  No response from lobby  

## 2017-03-02 NOTE — ED Notes (Signed)
Called again  No response from lobby  

## 2017-03-02 NOTE — ED Notes (Signed)
Called pt to take to treatment room  No response from lobby

## 2017-03-02 NOTE — ED Triage Notes (Signed)
Pt states in one of her recent ED visits she was told she has low calcium and needs an infusion, states she could not wait at the time is now back to get the infusion. Has no other complaints.

## 2017-03-10 ENCOUNTER — Other Ambulatory Visit: Payer: Self-pay

## 2017-03-10 ENCOUNTER — Inpatient Hospital Stay (HOSPITAL_COMMUNITY)
Admission: EM | Admit: 2017-03-10 | Discharge: 2017-03-11 | DRG: 202 | Discharge status: Against Medical Advice | Payer: Medicaid Other | Attending: Internal Medicine | Admitting: Internal Medicine

## 2017-03-10 ENCOUNTER — Emergency Department (HOSPITAL_COMMUNITY): Payer: Medicaid Other

## 2017-03-10 ENCOUNTER — Encounter (HOSPITAL_COMMUNITY): Payer: Self-pay | Admitting: Emergency Medicine

## 2017-03-10 DIAGNOSIS — Z452 Encounter for adjustment and management of vascular access device: Secondary | ICD-10-CM | POA: Diagnosis not present

## 2017-03-10 DIAGNOSIS — Z9981 Dependence on supplemental oxygen: Secondary | ICD-10-CM

## 2017-03-10 DIAGNOSIS — Z793 Long term (current) use of hormonal contraceptives: Secondary | ICD-10-CM | POA: Diagnosis not present

## 2017-03-10 DIAGNOSIS — Z87891 Personal history of nicotine dependence: Secondary | ICD-10-CM | POA: Diagnosis not present

## 2017-03-10 DIAGNOSIS — Z8249 Family history of ischemic heart disease and other diseases of the circulatory system: Secondary | ICD-10-CM | POA: Diagnosis not present

## 2017-03-10 DIAGNOSIS — R059 Cough, unspecified: Secondary | ICD-10-CM

## 2017-03-10 DIAGNOSIS — K219 Gastro-esophageal reflux disease without esophagitis: Secondary | ICD-10-CM | POA: Diagnosis present

## 2017-03-10 DIAGNOSIS — J45901 Unspecified asthma with (acute) exacerbation: Principal | ICD-10-CM | POA: Diagnosis present

## 2017-03-10 DIAGNOSIS — Z91040 Latex allergy status: Secondary | ICD-10-CM | POA: Diagnosis not present

## 2017-03-10 DIAGNOSIS — J45909 Unspecified asthma, uncomplicated: Secondary | ICD-10-CM | POA: Diagnosis present

## 2017-03-10 DIAGNOSIS — Z9049 Acquired absence of other specified parts of digestive tract: Secondary | ICD-10-CM | POA: Diagnosis not present

## 2017-03-10 DIAGNOSIS — Z886 Allergy status to analgesic agent status: Secondary | ICD-10-CM | POA: Diagnosis not present

## 2017-03-10 DIAGNOSIS — Z86718 Personal history of other venous thrombosis and embolism: Secondary | ICD-10-CM

## 2017-03-10 DIAGNOSIS — Z9119 Patient's noncompliance with other medical treatment and regimen: Secondary | ICD-10-CM

## 2017-03-10 DIAGNOSIS — Z79899 Other long term (current) drug therapy: Secondary | ICD-10-CM | POA: Diagnosis not present

## 2017-03-10 DIAGNOSIS — Z832 Family history of diseases of the blood and blood-forming organs and certain disorders involving the immune mechanism: Secondary | ICD-10-CM | POA: Diagnosis not present

## 2017-03-10 DIAGNOSIS — Z825 Family history of asthma and other chronic lower respiratory diseases: Secondary | ICD-10-CM

## 2017-03-10 DIAGNOSIS — E876 Hypokalemia: Secondary | ICD-10-CM | POA: Diagnosis present

## 2017-03-10 DIAGNOSIS — Z881 Allergy status to other antibiotic agents status: Secondary | ICD-10-CM | POA: Diagnosis not present

## 2017-03-10 DIAGNOSIS — Z91018 Allergy to other foods: Secondary | ICD-10-CM

## 2017-03-10 DIAGNOSIS — Z91041 Radiographic dye allergy status: Secondary | ICD-10-CM

## 2017-03-10 DIAGNOSIS — G8929 Other chronic pain: Secondary | ICD-10-CM | POA: Diagnosis present

## 2017-03-10 DIAGNOSIS — R569 Unspecified convulsions: Secondary | ICD-10-CM

## 2017-03-10 DIAGNOSIS — R0789 Other chest pain: Secondary | ICD-10-CM | POA: Diagnosis not present

## 2017-03-10 DIAGNOSIS — R0602 Shortness of breath: Secondary | ICD-10-CM | POA: Diagnosis present

## 2017-03-10 DIAGNOSIS — G40909 Epilepsy, unspecified, not intractable, without status epilepticus: Secondary | ICD-10-CM | POA: Diagnosis present

## 2017-03-10 DIAGNOSIS — Z91010 Allergy to peanuts: Secondary | ICD-10-CM | POA: Diagnosis not present

## 2017-03-10 DIAGNOSIS — Z888 Allergy status to other drugs, medicaments and biological substances status: Secondary | ICD-10-CM | POA: Diagnosis not present

## 2017-03-10 DIAGNOSIS — D573 Sickle-cell trait: Secondary | ICD-10-CM | POA: Diagnosis present

## 2017-03-10 DIAGNOSIS — J9621 Acute and chronic respiratory failure with hypoxia: Secondary | ICD-10-CM | POA: Diagnosis not present

## 2017-03-10 DIAGNOSIS — R05 Cough: Secondary | ICD-10-CM

## 2017-03-10 DIAGNOSIS — G4733 Obstructive sleep apnea (adult) (pediatric): Secondary | ICD-10-CM | POA: Diagnosis present

## 2017-03-10 DIAGNOSIS — Z880 Allergy status to penicillin: Secondary | ICD-10-CM

## 2017-03-10 LAB — RAPID URINE DRUG SCREEN, HOSP PERFORMED
Amphetamines: NOT DETECTED
BARBITURATES: NOT DETECTED
Benzodiazepines: NOT DETECTED
Cocaine: NOT DETECTED
Opiates: NOT DETECTED
Tetrahydrocannabinol: POSITIVE — AB

## 2017-03-10 LAB — CBC WITH DIFFERENTIAL/PLATELET
Basophils Absolute: 0 10*3/uL (ref 0.0–0.1)
Basophils Relative: 0 %
EOS ABS: 0.1 10*3/uL (ref 0.0–0.7)
Eosinophils Relative: 1 %
HCT: 38.7 % (ref 36.0–46.0)
HEMOGLOBIN: 12.6 g/dL (ref 12.0–15.0)
LYMPHS ABS: 2.8 10*3/uL (ref 0.7–4.0)
LYMPHS PCT: 36 %
MCH: 30.3 pg (ref 26.0–34.0)
MCHC: 32.6 g/dL (ref 30.0–36.0)
MCV: 93 fL (ref 78.0–100.0)
Monocytes Absolute: 0.2 10*3/uL (ref 0.1–1.0)
Monocytes Relative: 3 %
NEUTROS PCT: 60 %
Neutro Abs: 4.6 10*3/uL (ref 1.7–7.7)
Platelets: 137 10*3/uL — ABNORMAL LOW (ref 150–400)
RBC: 4.16 MIL/uL (ref 3.87–5.11)
RDW: 13.8 % (ref 11.5–15.5)
WBC: 7.8 10*3/uL (ref 4.0–10.5)

## 2017-03-10 LAB — COMPREHENSIVE METABOLIC PANEL
ALBUMIN: 4.5 g/dL (ref 3.5–5.0)
ALK PHOS: 53 U/L (ref 38–126)
ALT: 22 U/L (ref 14–54)
AST: 24 U/L (ref 15–41)
Anion gap: 13 (ref 5–15)
BUN: 9 mg/dL (ref 6–20)
CALCIUM: 9 mg/dL (ref 8.9–10.3)
CO2: 19 mmol/L — AB (ref 22–32)
CREATININE: 0.69 mg/dL (ref 0.44–1.00)
Chloride: 106 mmol/L (ref 101–111)
GFR calc non Af Amer: >60 mL/min (ref >60)
GLUCOSE: 88 mg/dL (ref 65–99)
Potassium: 3.3 mmol/L — ABNORMAL LOW (ref 3.5–5.1)
Sodium: 138 mmol/L (ref 135–145)
Total Bilirubin: 0.6 mg/dL (ref 0.3–1.2)
Total Protein: 7.4 g/dL (ref 6.5–8.1)

## 2017-03-10 LAB — URINALYSIS, ROUTINE W REFLEX MICROSCOPIC
BILIRUBIN URINE: NEGATIVE
GLUCOSE, UA: NEGATIVE mg/dL
HGB URINE DIPSTICK: NEGATIVE
KETONES UR: NEGATIVE mg/dL
Leukocytes, UA: NEGATIVE
Nitrite: NEGATIVE
Protein, ur: NEGATIVE mg/dL
Specific Gravity, Urine: 1.036 — ABNORMAL HIGH (ref 1.005–1.030)
pH: 5 (ref 5.0–8.0)

## 2017-03-10 LAB — INFLUENZA PANEL BY PCR (TYPE A & B)
INFLAPCR: NEGATIVE
Influenza B By PCR: NEGATIVE

## 2017-03-10 LAB — MAGNESIUM: MAGNESIUM: 2 mg/dL (ref 1.7–2.4)

## 2017-03-10 MED ORDER — ONDANSETRON HCL 4 MG/2ML IJ SOLN: 4.0000 mg | Freq: Four times a day (QID) | INTRAMUSCULAR | Status: DC | PRN

## 2017-03-10 MED ORDER — FLUTICASONE FUROATE-VILANTEROL 200-25 MCG/INH IN AEPB
1.0000 | INHALATION_SPRAY | Freq: Every day | RESPIRATORY_TRACT | Status: DC
  Administered 2017-03-11: 1 via RESPIRATORY_TRACT
  Filled 2017-03-10: qty 28

## 2017-03-10 MED ORDER — PROMETHAZINE HCL 25 MG PO TABS: 12.5000 mg | ORAL_TABLET | Freq: Four times a day (QID) | ORAL | Status: DC | PRN

## 2017-03-10 MED ORDER — POTASSIUM CHLORIDE IN NACL 20-0.9 MEQ/L-% IV SOLN
INTRAVENOUS | Status: DC
  Administered 2017-03-10 – 2017-03-11 (×2): via INTRAVENOUS
  Filled 2017-03-10 (×4): qty 1000

## 2017-03-10 MED ORDER — LEVETIRACETAM 500 MG PO TABS: 500.0000 mg | ORAL_TABLET | Freq: Two times a day (BID) | ORAL | Status: DC

## 2017-03-10 MED ORDER — ALBUTEROL SULFATE (2.5 MG/3ML) 0.083% IN NEBU
2.5000 mg | INHALATION_SOLUTION | RESPIRATORY_TRACT | Status: DC | PRN
Start: 2017-03-10 — End: 2017-03-10

## 2017-03-10 MED ORDER — ACETAMINOPHEN 325 MG PO TABS: 650.0000 mg | ORAL_TABLET | Freq: Four times a day (QID) | ORAL | Status: DC | PRN

## 2017-03-10 MED ORDER — SODIUM CHLORIDE 0.9 % IV SOLN
2.0000 g | Freq: Three times a day (TID) | INTRAVENOUS | Status: DC
  Administered 2017-03-11: 2 g via INTRAVENOUS
  Filled 2017-03-10 (×2): qty 2

## 2017-03-10 MED ORDER — SODIUM CHLORIDE 0.9 % IV SOLN: 250.0000 mL | INTRAVENOUS | Status: DC | PRN

## 2017-03-10 MED ORDER — IPRATROPIUM-ALBUTEROL 0.5-2.5 (3) MG/3ML IN SOLN
3.0000 mL | Freq: Four times a day (QID) | RESPIRATORY_TRACT | Status: DC
  Administered 2017-03-10 – 2017-03-11 (×4): 3 mL via RESPIRATORY_TRACT
  Filled 2017-03-10 (×4): qty 3

## 2017-03-10 MED ORDER — METHYLPREDNISOLONE SODIUM SUCC 125 MG IJ SOLR
125.0000 mg | Freq: Once | INTRAMUSCULAR | Status: AC
  Administered 2017-03-10: 125 mg via INTRAVENOUS
  Filled 2017-03-10: qty 2

## 2017-03-10 MED ORDER — ACETAMINOPHEN 325 MG PO TABS: 325.0000 mg | ORAL_TABLET | Freq: Four times a day (QID) | ORAL | Status: DC | PRN

## 2017-03-10 MED ORDER — ONDANSETRON HCL 4 MG PO TABS: 4.0000 mg | ORAL_TABLET | Freq: Four times a day (QID) | ORAL | Status: DC | PRN

## 2017-03-10 MED ORDER — KETOROLAC TROMETHAMINE 30 MG/ML IJ SOLN
30.0000 mg | Freq: Once | INTRAMUSCULAR | Status: AC
  Administered 2017-03-10: 30 mg via INTRAVENOUS
  Filled 2017-03-10: qty 1

## 2017-03-10 MED ORDER — POLYETHYLENE GLYCOL 3350 17 G PO PACK
17.0000 g | PACK | Freq: Every day | ORAL | Status: DC | PRN
Start: 2017-03-10 — End: 2017-03-11

## 2017-03-10 MED ORDER — METHYLPREDNISOLONE SODIUM SUCC 40 MG IJ SOLR
40.0000 mg | Freq: Two times a day (BID) | INTRAMUSCULAR | Status: DC
  Administered 2017-03-11 (×2): 40 mg via INTRAVENOUS
  Filled 2017-03-10 (×2): qty 1

## 2017-03-10 MED ORDER — SODIUM CHLORIDE 0.9% FLUSH: 3.0000 mL | INTRAVENOUS | Status: DC | PRN

## 2017-03-10 MED ORDER — ALBUTEROL SULFATE (2.5 MG/3ML) 0.083% IN NEBU: 2.5000 mg | INHALATION_SOLUTION | RESPIRATORY_TRACT | Status: DC | PRN

## 2017-03-10 MED ORDER — SODIUM CHLORIDE 0.9% FLUSH
3.0000 mL | Freq: Two times a day (BID) | INTRAVENOUS | Status: DC
  Administered 2017-03-10 – 2017-03-11 (×2): 3 mL via INTRAVENOUS

## 2017-03-10 MED ORDER — ACETAMINOPHEN 650 MG RE SUPP: 650.0000 mg | Freq: Four times a day (QID) | RECTAL | Status: DC | PRN

## 2017-03-10 MED ORDER — LEVETIRACETAM 500 MG PO TABS
500.0000 mg | ORAL_TABLET | Freq: Two times a day (BID) | ORAL | Status: DC
  Administered 2017-03-10 – 2017-03-11 (×2): 500 mg via ORAL
  Filled 2017-03-10 (×2): qty 1

## 2017-03-10 MED ORDER — GUAIFENESIN-DM 100-10 MG/5ML PO SYRP: 10.0000 mL | ORAL_SOLUTION | ORAL | Status: DC | PRN

## 2017-03-10 MED ORDER — OXYCODONE HCL 5 MG PO TABS
10.0000 mg | ORAL_TABLET | ORAL | Status: DC | PRN
  Administered 2017-03-11 (×2): 10 mg via ORAL
  Filled 2017-03-10 (×2): qty 2

## 2017-03-10 MED ORDER — ALBUTEROL SULFATE (2.5 MG/3ML) 0.083% IN NEBU
5.0000 mg | INHALATION_SOLUTION | Freq: Once | RESPIRATORY_TRACT | Status: AC
  Administered 2017-03-10: 5 mg via RESPIRATORY_TRACT
  Filled 2017-03-10: qty 6

## 2017-03-10 MED ORDER — PANTOPRAZOLE SODIUM 40 MG PO TBEC
40.0000 mg | DELAYED_RELEASE_TABLET | Freq: Every day | ORAL | Status: DC
  Administered 2017-03-11: 40 mg via ORAL
  Filled 2017-03-10: qty 1

## 2017-03-10 MED ORDER — POTASSIUM CHLORIDE CRYS ER 20 MEQ PO TBCR
40.0000 meq | EXTENDED_RELEASE_TABLET | Freq: Once | ORAL | Status: AC
  Administered 2017-03-10: 40 meq via ORAL
  Filled 2017-03-10: qty 2

## 2017-03-10 MED ORDER — ETONOGESTREL 68 MG ~~LOC~~ IMPL: 1.0000 | DRUG_IMPLANT | Freq: Once | SUBCUTANEOUS | Status: DC

## 2017-03-10 MED ORDER — HEPARIN SODIUM (PORCINE) 5000 UNIT/ML IJ SOLN
5000.0000 [IU] | Freq: Three times a day (TID) | INTRAMUSCULAR | Status: DC
Start: 2017-03-10 — End: 2017-03-11
  Administered 2017-03-10 – 2017-03-11 (×3): 5000 [IU] via SUBCUTANEOUS
  Filled 2017-03-10 (×3): qty 1

## 2017-03-10 MED ORDER — CEFEPIME HCL 2 G IJ SOLR
2.0000 g | Freq: Once | INTRAMUSCULAR | Status: AC
  Administered 2017-03-10: 2 g via INTRAVENOUS
  Filled 2017-03-10: qty 2

## 2017-03-10 MED ORDER — TRAZODONE HCL 50 MG PO TABS: 50.0000 mg | ORAL_TABLET | Freq: Every evening | ORAL | Status: DC | PRN

## 2017-03-10 MED ORDER — ALBUTEROL (5 MG/ML) CONTINUOUS INHALATION SOLN
10.0000 mg/h | INHALATION_SOLUTION | Freq: Once | RESPIRATORY_TRACT | Status: AC
  Administered 2017-03-10: 10 mg/h via RESPIRATORY_TRACT
  Filled 2017-03-10: qty 20

## 2017-03-10 MED ORDER — SENNA 8.6 MG PO TABS
1.0000 | ORAL_TABLET | Freq: Two times a day (BID) | ORAL | Status: DC
  Administered 2017-03-10 – 2017-03-11 (×2): 8.6 mg via ORAL
  Filled 2017-03-10 (×2): qty 1

## 2017-03-10 MED ORDER — KETOROLAC TROMETHAMINE 15 MG/ML IJ SOLN: 30.0000 mg | Freq: Four times a day (QID) | INTRAMUSCULAR | Status: DC | PRN

## 2017-03-10 MED ORDER — GUAIFENESIN ER 600 MG PO TB12
600.0000 mg | ORAL_TABLET | Freq: Two times a day (BID) | ORAL | Status: DC
  Administered 2017-03-10 – 2017-03-11 (×2): 600 mg via ORAL
  Filled 2017-03-10 (×2): qty 1

## 2017-03-10 NOTE — ED Triage Notes (Signed)
Patient here from home with complaints of increase SOB. Reports difficulty breathing. Hx of cystic fibrosis. Wet cough. Constant O2 3L .

## 2017-03-10 NOTE — ED Notes (Signed)
ED TO INPATIENT HANDOFF REPORT  Name/Age/Gender Jasmine [name] 27 y.o. female  Code Status    Code Status Orders  (From admission, onward)        Start     Ordered   03/10/17 1756  Full code  Continuous     03/10/17 1755    Code Status History    Date Active Date Inactive Code Status Order ID Comments User Context   01/03/2017 10:57 01/06/2017 15:49 Full Code [id number]  Donita Brooks, NP ED   11/12/2016 12:10 11/17/2016 17:35 Full Code [id number]  Elwin Mocha, MD ED   10/14/2015 13:07 10/18/2015 21:41 Full Code [id number]  Donnie Mesa, MD Inpatient   10/14/2015 12:09 10/14/2015 12:40 Full Code [id number]  Donnie Mesa, MD Outpatient   04/19/2015 23:11 04/28/2015 15:06 Full Code [id number]  Ivor Costa, MD ED   01/03/2015 02:39 01/07/2015 15:03 Full Code [id number]  Etta Quill, DO ED   11/08/2014 12:20 11/11/2014 16:25 Full Code [id number]  Truett Mainland, DO Inpatient   07/04/2014 03:13 07/06/2014 13:42 Full Code [id number]  Ivor Costa, MD Inpatient   04/07/2014 23:36 04/09/2014 14:58 Full Code [id number]  Lavina Hamman, MD ED   01/19/2013 07:42 01/22/2013 13:03 Full Code [id number]  Toy Baker, MD ED      Home/SNF/Other Home  Chief Complaint cystic fibrosis/congestion  Level of Care/Admitting Diagnosis ED Disposition    ED Disposition Condition [facility] Area: [facility] [[id number]]  Level of Care: Med-Surg [16]  Diagnosis: Cystic fibrosis [facility]) [[id number]]  Admitting Physician: Morrison Old  Attending Physician: Morrison Old  Estimated length of stay: 3 - 4 days  Certification:: I certify this patient will need inpatient services for at least 2 midnights  Bed request comments: med  PT Class (Do Not Modify): Inpatient [101]  PT Acc Code (Do Not Modify): Private [1]       Medical History Past Medical History:  Diagnosis Date  . Asthma   . Complication of anesthesia    "I wake up during  anesthesia" (10/14/2015)  . Cystic fibrosis ([place])   . DVT (deep vein thrombosis) in pregnancy ([place])   . Epilepsy ([place])   . GERD (gastroesophageal reflux disease)   . Heart murmur    last work up age 54- no symtoms  . Pancreatitis   . Seizures ([place])    epilepsy  . Sickle cell trait ([place])   . Sleep apnea    "used to wear CPAP; don't have one here in [place] since I moved in 2014" (10/14/2015)    Allergies Allergies  Allergen Reactions  . Cheese Shortness Of Breath  . Chocolate Shortness Of Breath  . Ibuprofen Hives and Shortness Of Breath    Children's ibuprofen  . Ivp Dye [Iodinated Diagnostic Agents] Shortness Of Breath  . Latex Anaphylaxis, Swelling and Other (See Comments)    Reaction:  Localized swelling   . Orange Juice [Orange Oil] Shortness Of Breath  . Other Hives and Other (See Comments)    Pt states that she is allergic to all steroids except IV solu-medrol.    Marland Kitchen Peach [Prunus Persica] Anaphylaxis  . Peanuts [Peanut Oil] Anaphylaxis  . Pear Anaphylaxis  . Prednisone Hives  . Raspberry Anaphylaxis  . Tylenol [Acetaminophen] Hives, Shortness Of Breath and Other (See Comments)    Pt states that this is only with the liquid form.    . Amoxicillin Hives and Other (See Comments)    Has patient  had a PCN reaction causing immediate rash, facial/tongue/throat swelling, SOB or lightheadedness with hypotension: No Has patient had a PCN reaction causing severe rash involving mucus membranes or skin necrosis: No Has patient had a PCN reaction that required hospitalization: No Has patient had a PCN reaction occurring within the last 10 years: No If all of the above answers are "NO", then may proceed with Cephalosporin use.  Marland Kitchen Apricot Flavor Hives  . Doxycycline Hives  . Erythromycin Hives  . Milk Of Magnesia [Magnesium Hydroxide] Hives and Itching  . Penicillins Hives and Other (See Comments)    Has patient had a PCN reaction causing immediate rash, facial/tongue/throat swelling,  SOB or lightheadedness with hypotension: No Has patient had a PCN reaction causing severe rash involving mucus membranes or skin necrosis: No Has patient had a PCN reaction that required hospitalization: No Has patient had a PCN reaction occurring within the last 10 years: No If all of the above answers are "NO", then may proceed with Cephalosporin use.    IV Location/Drains/Wounds Patient Lines/Drains/Airways Status   Active Line/Drains/Airways    None          Labs/Imaging Results for orders placed or performed during the hospital encounter of 03/10/17 (from the past 48 hour(s))  CBC with Differential     Status: Abnormal   Collection Time: 03/10/17  2:53 PM  Result Value Ref Range   WBC 7.8 4.0 - 10.5 K/uL   RBC 4.16 3.87 - 5.11 MIL/uL   Hemoglobin 12.6 12.0 - 15.0 g/dL   HCT 38.7 36.0 - 46.0 %   MCV 93.0 78.0 - 100.0 fL   MCH 30.3 26.0 - 34.0 pg   MCHC 32.6 30.0 - 36.0 g/dL   RDW 13.8 11.5 - 15.5 %   Platelets 137 (L) 150 - 400 K/uL   Neutrophils Relative % 60 %   Neutro Abs 4.6 1.7 - 7.7 K/uL   Lymphocytes Relative 36 %   Lymphs Abs 2.8 0.7 - 4.0 K/uL   Monocytes Relative 3 %   Monocytes Absolute 0.2 0.1 - 1.0 K/uL   Eosinophils Relative 1 %   Eosinophils Absolute 0.1 0.0 - 0.7 K/uL   Basophils Relative 0 %   Basophils Absolute 0.0 0.0 - 0.1 K/uL    Comment: Performed at [facility], [place] [address], [place], [place] [address]  Comprehensive metabolic panel     Status: Abnormal   Collection Time: 03/10/17  2:53 PM  Result Value Ref Range   Sodium 138 135 - 145 mmol/L   Potassium 3.3 (L) 3.5 - 5.1 mmol/L   Chloride 106 101 - 111 mmol/L   CO2 19 (L) 22 - 32 mmol/L   Glucose, Bld 88 65 - 99 mg/dL   BUN 9 6 - 20 mg/dL   Creatinine, Ser 0.69 0.44 - 1.00 mg/dL   Calcium 9.0 8.9 - 10.3 mg/dL   Total Protein 7.4 6.5 - 8.1 g/dL   Albumin 4.5 3.5 - 5.0 g/dL   AST 24 15 - 41 U/L   ALT 22 14 - 54 U/L   Alkaline Phosphatase 53 38 - 126 U/L   Total  Bilirubin 0.6 0.3 - 1.2 mg/dL   GFR calc non Af Amer >60 >60 mL/min   GFR calc Af Amer >60 >60 mL/min    Comment: (NOTE) The eGFR has been calculated using the CKD EPI equation. This calculation has not been validated in all clinical situations. eGFR's persistently <60 mL/min signify possible Chronic Kidney  Disease.    Anion gap 13 5 - 15    Comment: Performed at [facility], [place] [address], [place], [place] [address]  Influenza panel by PCR (type A & B)     Status: None   Collection Time: 03/10/17  4:12 PM  Result Value Ref Range   Influenza A By PCR NEGATIVE NEGATIVE   Influenza B By PCR NEGATIVE NEGATIVE    Comment: (NOTE) The Xpert Xpress Flu assay is intended as an aid in the diagnosis of  influenza and should not be used as a sole basis for treatment.  This  assay is FDA approved for nasopharyngeal swab specimens only. Nasal  washings and aspirates are unacceptable for Xpert Xpress Flu testing. Performed at [facility], [place] [address], [place], [place] [address]    Dg Chest 2 View  Result Date: 03/10/2017 CLINICAL DATA:  Cystic fibrosis. Throat pain, chest pain, short of breath. EXAM: CHEST  2 VIEW COMPARISON:  02/14/2017 FINDINGS: Normal heart size. Lungs clear. No pneumothorax. No pleural effusion. IMPRESSION: No active cardiopulmonary disease. Electronically Signed   By: Marybelle Killings M.D.   On: 03/10/2017 13:03    Pending Labs Unresulted Labs (From admission, onward)   Start     Ordered   03/11/17 7591  Basic metabolic panel  Tomorrow morning,   R     03/10/17 1755   03/10/17 1756  Magnesium  Add-on,   R     03/10/17 1755   03/10/17 1611  Urine rapid drug screen (hosp performed)  STAT,   R     03/10/17 1610   03/10/17 1423  Urinalysis, Routine w reflex microscopic  Once,   R     03/10/17 1422      Vitals/Pain Today's Vitals   03/10/17 1217 03/10/17 1322 03/10/17 1352 03/10/17 1627  BP:  (!) 149/96    Pulse:  98    Resp:   18    Temp:  99.3 F (37.4 C)    TempSrc:  Oral    SpO2:  97%    Height:      PainSc: 5   10-Worst pain ever 10-Worst pain ever    Isolation Precautions Droplet precaution  Medications Medications  ipratropium-albuterol (DUONEB) 0.5-2.5 (3) MG/3ML nebulizer solution 3 mL (not administered)  albuterol (PROVENTIL) (2.5 MG/3ML) 0.083% nebulizer solution 2.5 mg (not administered)  guaiFENesin (MUCINEX) 12 hr tablet 600 mg (not administered)  guaiFENesin-dextromethorphan (ROBITUSSIN DM) 100-10 MG/5ML syrup 10 mL (not administered)  ketorolac (TORADOL) 15 MG/ML injection 30 mg (not administered)  oxyCODONE (Oxy IR/ROXICODONE) immediate release tablet 10 mg (not administered)  methylPREDNISolone sodium succinate (SOLU-MEDROL) 40 mg/mL injection 40 mg (not administered)  acetaminophen (TYLENOL) tablet 325 mg (not administered)  fluticasone furoate-vilanterol (BREO ELLIPTA) 200-25 MCG/INH 1 puff (not administered)  levETIRAcetam (KEPPRA) tablet 500 mg (not administered)  pantoprazole (PROTONIX) EC tablet 40 mg (not administered)  promethazine (PHENERGAN) tablet 12.5 mg (not administered)  sodium chloride flush (NS) 0.9 % injection 3 mL (not administered)  sodium chloride flush (NS) 0.9 % injection 3 mL (not administered)  0.9 %  sodium chloride infusion (not administered)  acetaminophen (TYLENOL) tablet 650 mg (not administered)    Or  acetaminophen (TYLENOL) suppository 650 mg (not administered)  traZODone (DESYREL) tablet 50 mg (not administered)  senna (SENOKOT) tablet 8.6 mg (not administered)  polyethylene glycol (MIRALAX / GLYCOLAX) packet 17 g (not administered)  ondansetron (ZOFRAN) tablet 4 mg (not administered)    Or  ondansetron (ZOFRAN) injection 4 mg (not administered)  heparin injection 5,000 Units (not administered)  0.9 % NaCl with KCl 20 mEq/ L  infusion (not administered)  ceFEPIme (MAXIPIME) 2 g in sodium chloride 0.9 % 100 mL IVPB (not administered)  ceFEPIme  (MAXIPIME) 2 g in sodium chloride 0.9 % 100 mL IVPB (not administered)  albuterol (PROVENTIL) (2.5 MG/3ML) 0.083% nebulizer solution 5 mg (5 mg Nebulization Given 03/10/17 1351)  methylPREDNISolone sodium succinate (SOLU-MEDROL) 125 mg/2 mL injection 125 mg (125 mg Intravenous Given 03/10/17 1627)  albuterol (PROVENTIL,VENTOLIN) solution continuous neb (10 mg/hr Nebulization Given 03/10/17 1524)  ketorolac (TORADOL) 30 MG/ML injection 30 mg (30 mg Intravenous Given 03/10/17 1626)  potassium chloride SA (K-DUR,KLOR-CON) CR tablet 40 mEq (40 mEq Oral Given 03/10/17 1627)    Mobility walks

## 2017-03-10 NOTE — Progress Notes (Signed)
Pharmacy Antibiotic Note  Jasmine [name] is a 27 y.o. female admitted on 03/10/2017 with acute pulmonary exacerbation in setting of cystic fibrosis.  Pharmacy has been consulted for Cefepime dosing.  Plan: Cefepime 2g IV q8h.  Consider an additional agent to cover for Pseudomonas aeruginosa (double cover) +/- MRSA coverage empirically per CF guidelines.  Narrow based on sputum cultures.  Height: 5\' 4"  (162.6 cm) IBW/kg (Calculated) : 54.7  Temp (24hrs), Avg:98.9 F (37.2 C), Min:98.4 F (36.9 C), Max:99.3 F (37.4 C)  Recent Labs  Lab 03/10/17 1453  WBC 7.8  CREATININE 0.69    CrCl cannot be calculated (Unknown ideal weight.).    Allergies  Allergen Reactions  . Cheese Shortness Of Breath  . Chocolate Shortness Of Breath  . Ibuprofen Hives and Shortness Of Breath    Children's ibuprofen  . Ivp Dye [Iodinated Diagnostic Agents] Shortness Of Breath  . Latex Anaphylaxis, Swelling and Other (See Comments)    Reaction:  Localized swelling   . Orange Juice [Orange Oil] Shortness Of Breath  . Other Hives and Other (See Comments)    Pt states that she is allergic to all steroids except IV solu-medrol.    Marland Kitchen Peach [Prunus Persica] Anaphylaxis  . Peanuts [Peanut Oil] Anaphylaxis  . Pear Anaphylaxis  . Prednisone Hives  . Raspberry Anaphylaxis  . Tylenol [Acetaminophen] Hives, Shortness Of Breath and Other (See Comments)    Pt states that this is only with the liquid form.    . Amoxicillin Hives and Other (See Comments)    Has patient had a PCN reaction causing immediate rash, facial/tongue/throat swelling, SOB or lightheadedness with hypotension: No Has patient had a PCN reaction causing severe rash involving mucus membranes or skin necrosis: No Has patient had a PCN reaction that required hospitalization: No Has patient had a PCN reaction occurring within the last 10 years: No If all of the above answers are "NO", then may proceed with Cephalosporin use.  Marland Kitchen Apricot Flavor Hives   . Doxycycline Hives  . Erythromycin Hives  . Milk Of Magnesia [Magnesium Hydroxide] Hives and Itching  . Penicillins Hives and Other (See Comments)    Has patient had a PCN reaction causing immediate rash, facial/tongue/throat swelling, SOB or lightheadedness with hypotension: No Has patient had a PCN reaction causing severe rash involving mucus membranes or skin necrosis: No Has patient had a PCN reaction that required hospitalization: No Has patient had a PCN reaction occurring within the last 10 years: No If all of the above answers are "NO", then may proceed with Cephalosporin use.    Antimicrobials this admission: 2/24 Cefepime >>   Dose adjustments this admission:  Microbiology results:   Thank you for allowing pharmacy to be a part of this patient's care.  Hershal Coria 03/10/2017 6:28 PM

## 2017-03-10 NOTE — ED Notes (Signed)
Asked RN Kandice Moos if I could take patient up, report has been called but the floor will not take the pt at this time due to shift change.

## 2017-03-10 NOTE — Progress Notes (Addendum)
Called RN re PICC order. No answer from RN after holding >6 minutes. No charting re PICC needs, no attempts for PIV charted, no IVT consult to attempt PIV noted. PICC will not be placed until 03-11-17

## 2017-03-10 NOTE — ED Notes (Signed)
Attempted to call floor to follow up report to receiving Nurse, unable to give report/talk to the receiving Nurse  at this time , this Nurse was told that Nurses on the floor are in the middle of receiving reports. To call back in a few.

## 2017-03-10 NOTE — ED Notes (Signed)
Patient informed urine sample was needed. Patient stated they could "not yet" provide one.

## 2017-03-10 NOTE — H&P (Signed)
Patient Demographics:    Jasmine [name], is a 27 y.o. female  MRN: [id number]   DOB - Apr 17, 1990  Admit Date - 03/10/2017  Outpatient Primary MD for the patient is Patient, No Pcp Per   Assessment & Plan:    Principal Problem:   Acute on chronic respiratory failure with hypoxia (HCC) Active Problems:   GERD (gastroesophageal reflux disease)   Asthma   Seizure (HCC)   Cystic fibrosis (HCC)    1) acute on chronic hypoxic respiratory failure due to presumed flareup of cystic fibrosis and underlying asthma-influenza test is negative, chest x-ray without acute findings, at baseline patient uses 3 L of oxygen at home, continue oxygen supplementation, treat empirically with IV cefepime (cystic fibrosis with risk for Pseudomonas), prior to admission patient was on Levaquin but she was noncompliant.  Treated with IV Solu-Medrol, mucolytics bronchodilators as ordered, chest precautions and therapy to help mucus mobilization  2)H/o SZ-restart Keppra, no recent seizures  3) chronic pain concerns-be judicious with opiates, Toradol as ordered, await urine drug screen    With History of - Reviewed by me  Past Medical History:  Diagnosis Date  . Asthma   . Complication of anesthesia    "I wake up during anesthesia" (10/14/2015)  . Cystic fibrosis ([place])   . DVT (deep vein thrombosis) in pregnancy ([place])   . Epilepsy ([place])   . GERD (gastroesophageal reflux disease)   . Heart murmur    last work up age 25- no symtoms  . Pancreatitis   . Seizures ([place])    epilepsy  . Sickle cell trait ([place])   . Sleep apnea    "used to wear CPAP; don't have one here in [place] since I moved in 2014" (10/14/2015)      Past Surgical History:  Procedure Laterality Date  . APPENDECTOMY    . CESAREAN SECTION  2013  . CESAREAN SECTION N/A  11/08/2014   Procedure: CESAREAN SECTION;  Surgeon: Truett Mainland, DO;  Location: [facility];  Service: Obstetrics;  Laterality: N/A;  . CHOLECYSTECTOMY N/A 10/16/2015   Procedure: LAPAROSCOPIC CHOLECYSTECTOMY WITH POSSIBLE INTRAOPERATIVE CHOLANGIOGRAM;  Surgeon: Donnie Mesa, MD;  Location: [facility];  Service: General;  Laterality: N/A;  . ESOPHAGOGASTRODUODENOSCOPY (EGD) WITH PROPOFOL N/A 11/15/2016   Procedure: ESOPHAGOGASTRODUODENOSCOPY (EGD) WITH PROPOFOL;  Surgeon: Clarene Essex, MD;  Location: [facility];  Service: Endoscopy;  Laterality: N/A;  . INGUINAL HERNIA REPAIR Bilateral ~ 1996  . NERVE, TENDON AND ARTERY REPAIR Right 09/23/2012   Procedure: I&D and Repair As Necessary/Right Hand and Palm;  Surgeon: Roseanne Kaufman, MD;  Location: [facility];  Service: Orthopedics;  Laterality: Right;  . TONSILLECTOMY        Chief Complaint  Patient presents with  . Cough  . Shortness of Breath      HPI:    Jasmine [name]  is a 27 y.o. female, with past medical history relevant for chronic hypoxic respiratory failure on 3 L  of oxygen at home chronically due to underlying cystic fibrosis and asthma, as well as history of seizures without recent seizures, sickle cell trait  and chronic pain concerns who presents to the ED with complaints of a worsening respiratory symptoms including increased sputum/mucus production, increased shortness of breath, chills, low-grade temps up to 100.1,.  Patient was using a percussion device at home with no relief.  Please note the patient was seen 02/14/2017 at that time she left AGAINST MEDICAL ADVICE due to family situation.  She returns today with complaint of worsening respiratory symptoms in the ED apparently she had increased work of breathing she was given repeated doses of bronchodilators  No frank chest pains, no leg pains no leg swelling no pleuritic symptoms  At the time of my evaluation in the ED patient is still getting back to back bronchodilator treatments and  she is coughing intermittently.   Chest x-ray in ED without acute cardiopulmonary findings, influenza test is negative  Urine drug test is pending  Patient denies tobacco or alcohol use, ???  About possible drug use during pregnancy   Patient states that she moved from [place] 7 months ago because her husband who is in the [employer] transfer to [place], she apparently has not established care with a PCP here   Review of systems:    In addition to the HPI above,   A full 12 point Review of 10 Systems was done, except as stated above, all other Review of 10 Systems were negative.    Social History:  Reviewed by me    Social History   Tobacco Use  . Smoking status: Former Smoker    Packs/day: 0.25    Years: 4.00    Pack years: 1.00    Types: Cigarettes    Last attempt to quit: 10/02/2015    Years since quitting: 1.4  . Smokeless tobacco: Never Used  Substance Use Topics  . Alcohol use: No    Alcohol/week: 0.0 oz    Frequency: Never       Family History :  Reviewed by me    Family History  Problem Relation Age of Onset  . Cancer Mother        cervical cancer  . Asthma Mother   . Hypertension Father   . Sickle cell anemia Father   . Asthma Sister   . Diabetes Maternal Aunt   . Cancer Maternal Grandmother      Home Medications:   Prior to Admission medications   Medication Sig Start Date End Date Taking? Authorizing Provider  acetaminophen (TYLENOL) 325 MG tablet Take 325 mg by mouth every 6 (six) hours as needed for moderate pain.   Yes [provider]  albuterol (PROVENTIL HFA;VENTOLIN HFA) 108 (90 Base) MCG/ACT inhaler Inhale 2 puffs into the lungs every 4 (four) hours as needed for wheezing or shortness of breath. 01/06/17  Yes Robbie Lis, MD  etonogestrel (NEXPLANON) 68 MG IMPL implant 1 each once by Subdermal route.   Yes [provider]  fluticasone-salmeterol (ADVAIR HFA) 115-21 MCG/ACT inhaler Inhale 2 puffs into the lungs  2 (two) times daily. 01/06/17  Yes Robbie Lis, MD  ipratropium-albuterol (DUONEB) 0.5-2.5 (3) MG/3ML SOLN Take 3 mLs by nebulization every 4 (four) hours as needed. 01/06/17 03/10/17 Yes Robbie Lis, MD  levETIRAcetam (KEPPRA) 500 MG tablet Take 1 tablet (500 mg total) by mouth 2 (two) times daily. 01/06/17  Yes Robbie Lis, MD  naproxen (NAPROSYN) 375  MG tablet Take 1 tablet (375 mg total) by mouth 2 (two) times daily. 02/26/17  Yes Upstill, Nehemiah Settle, PA-C  oxyCODONE (ROXICODONE) 5 MG immediate release tablet Take 1 tablet (5 mg total) by mouth every 4 (four) hours as needed for severe pain. 06/23/16  Yes Nona Dell, PA-C  pantoprazole (PROTONIX) 20 MG tablet Take 1 tablet (20 mg total) by mouth daily. 01/06/17  Yes Robbie Lis, MD  promethazine (PHENERGAN) 12.5 MG tablet Take 1 tablet (12.5 mg total) by mouth every 6 (six) hours as needed for nausea or vomiting. 01/06/17  Yes Robbie Lis, MD  predniSONE (DELTASONE) 5 MG tablet Take 10 tablets (50 mg total) by mouth daily with breakfast. Taper down prednisone starting from 50 mg a day, taper down by 5 mg a day down to 0 mg. for ex, today 50 mg, tomorrow 45 mg, then 40 mg the following day and etc... Patient not taking: Reported on 01/29/2017 01/06/17   Robbie Lis, MD     Allergies:     Allergies  Allergen Reactions  . Cheese Shortness Of Breath  . Chocolate Shortness Of Breath  . Ibuprofen Hives and Shortness Of Breath    Children's ibuprofen  . Ivp Dye [Iodinated Diagnostic Agents] Shortness Of Breath  . Latex Anaphylaxis, Swelling and Other (See Comments)    Reaction:  Localized swelling   . Orange Juice [Orange Oil] Shortness Of Breath  . Other Hives and Other (See Comments)    Pt states that she is allergic to all steroids except IV solu-medrol.    Marland Kitchen Peach [Prunus Persica] Anaphylaxis  . Peanuts [Peanut Oil] Anaphylaxis  . Pear Anaphylaxis  . Prednisone Hives  . Raspberry Anaphylaxis  . Tylenol  [Acetaminophen] Hives, Shortness Of Breath and Other (See Comments)    Pt states that this is only with the liquid form.    . Amoxicillin Hives and Other (See Comments)    Has patient had a PCN reaction causing immediate rash, facial/tongue/throat swelling, SOB or lightheadedness with hypotension: No Has patient had a PCN reaction causing severe rash involving mucus membranes or skin necrosis: No Has patient had a PCN reaction that required hospitalization: No Has patient had a PCN reaction occurring within the last 10 years: No If all of the above answers are "NO", then may proceed with Cephalosporin use.  Marland Kitchen Apricot Flavor Hives  . Doxycycline Hives  . Erythromycin Hives  . Milk Of Magnesia [Magnesium Hydroxide] Hives and Itching  . Penicillins Hives and Other (See Comments)    Has patient had a PCN reaction causing immediate rash, facial/tongue/throat swelling, SOB or lightheadedness with hypotension: No Has patient had a PCN reaction causing severe rash involving mucus membranes or skin necrosis: No Has patient had a PCN reaction that required hospitalization: No Has patient had a PCN reaction occurring within the last 10 years: No If all of the above answers are "NO", then may proceed with Cephalosporin use.     Physical Exam:   Vitals  Blood pressure (!) 149/96, pulse 98, temperature 99.3 F (37.4 C), temperature source Oral, resp. rate 18, height 5\' 4"  (1.626 m), last menstrual period 03/03/2017, SpO2 100 %, currently breastfeeding.  Physical Examination: General appearance -coughing intermittently some respiratory discomfort but able to talk in short sentences in between Mental status - alert, oriented to person, place, and time,  Eyes - sclera anicteric Nose- Neb rx mask  Neck - supple, no JVD elevation , Chest -scattered rhonchi wheezes bilaterally, and  movement is fairly symmetrical , no rales heart - S1 and S2 normal,  Abdomen - soft, nontender, nondistended, no masses or  organomegaly Neurological - screening mental status exam normal, neck supple without rigidity, cranial nerves II through XII intact, DTR's normal and symmetric Extremities - no pedal edema noted, intact peripheral pulses  Skin - warm, dry    Data Review:    CBC Recent Labs  Lab 03/10/17 1453  WBC 7.8  HGB 12.6  HCT 38.7  PLT 137*  MCV 93.0  MCH 30.3  MCHC 32.6  RDW 13.8  LYMPHSABS 2.8  MONOABS 0.2  EOSABS 0.1  BASOSABS 0.0   ------------------------------------------------------------------------------------------------------------------  Chemistries  Recent Labs  Lab 03/10/17 1453  NA 138  K 3.3*  CL 106  CO2 19*  GLUCOSE 88  BUN 9  CREATININE 0.69  CALCIUM 9.0  AST 24  ALT 22  ALKPHOS 53  BILITOT 0.6   ------------------------------------------------------------------------------------------------------------------ CrCl cannot be calculated (Unknown ideal weight.). ------------------------------------------------------------------------------------------------------------------ No results for input(s): TSH, T4TOTAL, T3FREE, THYROIDAB in the last 72 hours.  Invalid input(s): FREET3   Coagulation profile No results for input(s): INR, PROTIME in the last 168 hours. ------------------------------------------------------------------------------------------------------------------- No results for input(s): DDIMER in the last 72 hours. -------------------------------------------------------------------------------------------------------------------  Cardiac Enzymes No results for input(s): CKMB, TROPONINI, MYOGLOBIN in the last 168 hours.  Invalid input(s): CK ------------------------------------------------------------------------------------------------------------------ No results found for: BNP   ---------------------------------------------------------------------------------------------------------------  Urinalysis    Component Value Date/Time     COLORURINE YELLOW 03/10/2017 [place] 03/10/2017 1423   LABSPEC 1.036 (H) 03/10/2017 1423   PHURINE 5.0 03/10/2017 1423   GLUCOSEU NEGATIVE 03/10/2017 1423   HGBUR NEGATIVE 03/10/2017 [place] 03/10/2017 1423   KETONESUR NEGATIVE 03/10/2017 [place] 03/10/2017 1423   UROBILINOGEN 1.0 11/04/2014 0946   NITRITE NEGATIVE 03/10/2017 1423   LEUKOCYTESUR NEGATIVE 03/10/2017 1423    ----------------------------------------------------------------------------------------------------------------   Imaging Results:    Dg Chest 2 View  Result Date: 03/10/2017 CLINICAL DATA:  Cystic fibrosis. Throat pain, chest pain, short of breath. EXAM: CHEST  2 VIEW COMPARISON:  02/14/2017 FINDINGS: Normal heart size. Lungs clear. No pneumothorax. No pleural effusion. IMPRESSION: No active cardiopulmonary disease. Electronically Signed   By: Marybelle Killings M.D.   On: 03/10/2017 13:03    Radiological Exams on Admission: Dg Chest 2 View  Result Date: 03/10/2017 CLINICAL DATA:  Cystic fibrosis. Throat pain, chest pain, short of breath. EXAM: CHEST  2 VIEW COMPARISON:  02/14/2017 FINDINGS: Normal heart size. Lungs clear. No pneumothorax. No pleural effusion. IMPRESSION: No active cardiopulmonary disease. Electronically Signed   By: Marybelle Killings M.D.   On: 03/10/2017 13:03    DVT Prophylaxis -SCD   AM Labs Ordered, also please review Full Orders  Family Communication: Admission, patients condition and plan of care including tests being ordered have been discussed with the patient who indicate understanding and agree with the plan   Code Status - Full Code  Likely DC to  home  Condition   stable  Roxan Hockey M.D on 03/10/2017 at 7:57 PM   Between 7am to 7pm - Pager - [phone] After 7pm go to [email or web address] - password [email or web address]  [facility] - Office  [phone]  Voice Recognition Viviann Spare dictation system was used to create this note,  attempts have been made to correct errors. Please contact the author with questions and/or clarifications.

## 2017-03-10 NOTE — ED Provider Notes (Signed)
[facility] DEPT Provider Note   CSN: [id number] Arrival date & time: 03/10/17  1152     History   Chief Complaint Chief Complaint  Patient presents with  . Cough  . Shortness of Breath    HPI  Jasmine [name] is a 27 y.o. Female with a history of cystic fibrosis, asthma, seizures, pancreatitis, GERD, sickle cell trait, who presents to the ED for evaluation of shortness of breath and difficulty clearing her mucus secretions.  Patient reports for the past 2-3 days she has been having increasing chest congestion and difficulty clearing her secretions despite using her chest percussion and chest physical therapy.  Patient reports her chest feels heavy and congested and like she cannot get a good breath.  Patient reports she commonly has some chest discomfort when this happens and this is the case today.  Chest pain is constant soreness and not pleuritic.  Patient reports some frequent coughing but unable to and nasal congestion, reports low-grade fever, T-max at home 100.1 and chills.  Denies abdominal pain, nausea, vomiting, she does report some mucus in her stools but no blood.  Similar presentation on 1/31, but patient had to leave AMA due to a family emergency.  She reports she is on chronic Levaquin but is missed a few doses she was unable to get to the pharmacy to pick up her refill.  Patient does not yet have a pulmonologist in the area, but has an upcoming appointment at the end of the month with [facility] pulmonology.      Past Medical History:  Diagnosis Date  . Asthma   . Complication of anesthesia    "I wake up during anesthesia" (10/14/2015)  . Cystic fibrosis ([place])   . DVT (deep vein thrombosis) in pregnancy ([place])   . Epilepsy ([place])   . GERD (gastroesophageal reflux disease)   . Heart murmur    last work up age 65- no symtoms  . Pancreatitis   . Seizures ([place])    epilepsy  . Sickle cell trait ([place])   . Sleep apnea    "used to wear CPAP;  don't have one here in [place] since I moved in 2014" (10/14/2015)    Patient Active Problem List   Diagnosis Date Noted  . Colitis 11/12/2016  . Seizure ([place]) 11/12/2016  . Calculus of bile duct with acute cholecystitis with obstruction 10/14/2015  . Adjustment disorder with mixed anxiety and depressed mood 04/26/2015  . Low blood potassium 04/19/2015  . Asthma with acute exacerbation 04/19/2015  . Asthma 01/03/2015  . Asthma exacerbation 01/03/2015  . Influenza-like illness 01/03/2015  . Status post cesarean section 11/08/2014  . Previous cesarean section complicating pregnancy, antepartum condition or complication   . GERD (gastroesophageal reflux disease)   . Gastroesophageal reflux disease without esophagitis   . Abnormal biochemical finding on antenatal screening of mother   . Abnormal quad screen   . Echogenic focus of heart of fetus affecting antepartum care of mother   . Drug use affecting pregnancy, antepartum 05/19/2014  . Seizure disorder during pregnancy, antepartum ([place]) 05/13/2014  . Supervision of high risk pregnancy, antepartum 05/13/2014  . Asthma complicating pregnancy, antepartum 05/13/2014  . History of food anaphylaxis 05/13/2014  . Seizure disorder, sept 2015 last seizure 01/19/2013    Past Surgical History:  Procedure Laterality Date  . APPENDECTOMY    . CESAREAN SECTION  2013  . CESAREAN SECTION N/A 11/08/2014   Procedure: CESAREAN SECTION;  Surgeon: Truett Mainland, DO;  Location: [facility];  Service: Obstetrics;  Laterality: N/A;  . CHOLECYSTECTOMY N/A 10/16/2015   Procedure: LAPAROSCOPIC CHOLECYSTECTOMY WITH POSSIBLE INTRAOPERATIVE CHOLANGIOGRAM;  Surgeon: Donnie Mesa, MD;  Location: [facility];  Service: General;  Laterality: N/A;  . ESOPHAGOGASTRODUODENOSCOPY (EGD) WITH PROPOFOL N/A 11/15/2016   Procedure: ESOPHAGOGASTRODUODENOSCOPY (EGD) WITH PROPOFOL;  Surgeon: Clarene Essex, MD;  Location: [facility];  Service: Endoscopy;  Laterality: N/A;  . INGUINAL HERNIA  REPAIR Bilateral ~ 1996  . NERVE, TENDON AND ARTERY REPAIR Right 09/23/2012   Procedure: I&D and Repair As Necessary/Right Hand and Palm;  Surgeon: Roseanne Kaufman, MD;  Location: [facility];  Service: Orthopedics;  Laterality: Right;  . TONSILLECTOMY      OB History    Gravida Para Term Preterm AB Living   3 3 3     3    SAB TAB Ectopic Multiple Live Births         0 3       Home Medications    Prior to Admission medications   Medication Sig Start Date End Date Taking? Authorizing Provider  acetaminophen (TYLENOL) 325 MG tablet Take 325 mg by mouth every 6 (six) hours as needed for moderate pain.    [provider]  albuterol (PROVENTIL HFA;VENTOLIN HFA) 108 (90 Base) MCG/ACT inhaler Inhale 2 puffs into the lungs every 4 (four) hours as needed for wheezing or shortness of breath. 01/06/17   Robbie Lis, MD  etonogestrel (NEXPLANON) 68 MG IMPL implant 1 each once by Subdermal route.    [provider]  fluticasone-salmeterol (ADVAIR HFA) 115-21 MCG/ACT inhaler Inhale 2 puffs into the lungs 2 (two) times daily. 01/06/17   Robbie Lis, MD  ipratropium-albuterol (DUONEB) 0.5-2.5 (3) MG/3ML SOLN Take 3 mLs by nebulization every 4 (four) hours as needed. 01/06/17 02/14/17  Robbie Lis, MD  levETIRAcetam (KEPPRA) 500 MG tablet Take 1 tablet (500 mg total) by mouth 2 (two) times daily. 01/06/17   Robbie Lis, MD  naproxen (NAPROSYN) 375 MG tablet Take 1 tablet (375 mg total) by mouth 2 (two) times daily. 02/26/17   Charlann Lange, PA-C  oxyCODONE (ROXICODONE) 5 MG immediate release tablet Take 1 tablet (5 mg total) by mouth every 4 (four) hours as needed for severe pain. 06/23/16   Nona Dell, PA-C  pantoprazole (PROTONIX) 20 MG tablet Take 1 tablet (20 mg total) by mouth daily. 01/06/17   Robbie Lis, MD  predniSONE (DELTASONE) 5 MG tablet Take 10 tablets (50 mg total) by mouth daily with breakfast. Taper down prednisone starting from 50 mg a day, taper down by  5 mg a day down to 0 mg. for ex, today 50 mg, tomorrow 45 mg, then 40 mg the following day and etc... Patient not taking: Reported on 01/29/2017 01/06/17   Robbie Lis, MD  promethazine (PHENERGAN) 12.5 MG tablet Take 1 tablet (12.5 mg total) by mouth every 6 (six) hours as needed for nausea or vomiting. 01/06/17   Robbie Lis, MD    Family History Family History  Problem Relation Age of Onset  . Cancer Mother        cervical cancer  . Asthma Mother   . Hypertension Father   . Sickle cell anemia Father   . Asthma Sister   . Diabetes Maternal Aunt   . Cancer Maternal Grandmother     Social History Social History   Tobacco Use  . Smoking status: Former Smoker    Packs/day: 0.25  Years: 4.00    Pack years: 1.00    Types: Cigarettes    Last attempt to quit: 10/02/2015    Years since quitting: 1.4  . Smokeless tobacco: Never Used  Substance Use Topics  . Alcohol use: No    Alcohol/week: 0.0 oz    Frequency: Never  . Drug use: No     Allergies   Cheese; Chocolate; Ibuprofen; Ivp dye [iodinated diagnostic agents]; Latex; Orange juice [orange oil]; Other; Peach [prunus persica]; Peanuts [peanut oil]; Pear; Prednisone; Raspberry; Tylenol [acetaminophen]; Amoxicillin; Apricot flavor; Doxycycline; Erythromycin; Milk of magnesia [magnesium hydroxide]; and Penicillins   Review of Systems Review of Systems  Constitutional: Positive for chills and fever.  HENT: Positive for congestion and rhinorrhea. Negative for sore throat.   Eyes: Negative for visual disturbance.  Respiratory: Positive for cough, chest tightness, shortness of breath and wheezing.   Cardiovascular: Positive for chest pain. Negative for palpitations and leg swelling.  Gastrointestinal: Positive for diarrhea. Negative for abdominal pain, nausea and vomiting.  Genitourinary: Negative for dysuria.  Musculoskeletal: Negative for arthralgias and myalgias.  Skin: Negative for color change and rash.    Neurological: Negative for dizziness, weakness and light-headedness.     Physical Exam Updated Vital Signs BP (!) 149/96 (BP Location: Left Arm)   Pulse 98   Temp 99.3 F (37.4 C) (Oral)   Resp 18   Ht 5\' 4"  (1.626 m)   LMP 03/03/2017 (Approximate)   SpO2 97%   BMI 28.15 kg/m   Physical Exam  Constitutional: She is oriented to person, place, and time. She appears well-developed and well-nourished. No distress.  HENT:  Head: Normocephalic and atraumatic.  Mouth/Throat: Oropharynx is clear and moist.  Eyes: Right eye exhibits no discharge. Left eye exhibits no discharge.  Neck: Neck supple.  Cardiovascular: Normal rate, regular rhythm, normal heart sounds and intact distal pulses.  Pulmonary/Chest: No respiratory distress.  Mild increase in respiratory effort, patient becomes exasperated towards the end of sentences, diffuse rhonchi throughout both lung fields with decreased air movement, on home 3 L of O2  Abdominal: Soft. Bowel sounds are normal. She exhibits no distension and no mass. There is no tenderness. There is no guarding.  Musculoskeletal: She exhibits no edema or deformity.  Neurological: She is alert and oriented to person, place, and time. Coordination normal.  Skin: Skin is warm and dry. Capillary refill takes less than 2 seconds. She is not diaphoretic.  Psychiatric: She has a normal mood and affect. Her behavior is normal.  Nursing note and vitals reviewed.    ED Treatments / Results  Labs (all labs ordered are listed, but only abnormal results are displayed) Labs Reviewed  CBC WITH DIFFERENTIAL/PLATELET - Abnormal; Notable for the following components:      Result Value   Platelets 137 (*)    All other components within normal limits  COMPREHENSIVE METABOLIC PANEL - Abnormal; Notable for the following components:   Potassium 3.3 (*)    CO2 19 (*)    All other components within normal limits  URINALYSIS, ROUTINE W REFLEX MICROSCOPIC  RAPID URINE DRUG  SCREEN, HOSP PERFORMED  INFLUENZA PANEL BY PCR (TYPE A & B)    EKG  EKG Interpretation  Date/Time:  Sunday March 10 2017 12:16:13 EST Ventricular Rate:  81 PR Interval:    QRS Duration: 89 QT Interval:  416 QTC Calculation: 483 R Axis:   50 Text Interpretation:  Sinus rhythm Short PR interval Borderline T wave abnormalities Borderline prolonged QT interval  Baseline wander in lead(s) I III aVR aVL Confirmed by Nat Christen [phone]) on 03/10/2017 3:00:42 PM       Radiology Dg Chest 2 View  Result Date: 03/10/2017 CLINICAL DATA:  Cystic fibrosis. Throat pain, chest pain, short of breath. EXAM: CHEST  2 VIEW COMPARISON:  02/14/2017 FINDINGS: Normal heart size. Lungs clear. No pneumothorax. No pleural effusion. IMPRESSION: No active cardiopulmonary disease. Electronically Signed   By: Marybelle Killings M.D.   On: 03/10/2017 13:03    Procedures Procedures (including critical care time)  Medications Ordered in ED Medications  methylPREDNISolone sodium succinate (SOLU-MEDROL) 125 mg/2 mL injection 125 mg (not administered)  ketorolac (TORADOL) 30 MG/ML injection 30 mg (not administered)  potassium chloride SA (K-DUR,KLOR-CON) CR tablet 40 mEq (not administered)  albuterol (PROVENTIL) (2.5 MG/3ML) 0.083% nebulizer solution 5 mg (5 mg Nebulization Given 03/10/17 1351)  albuterol (PROVENTIL,VENTOLIN) solution continuous neb (10 mg/hr Nebulization Given 03/10/17 1524)     Initial Impression / Assessment and Plan / ED Course  I have reviewed the triage vital signs and the nursing notes.  Pertinent labs & imaging results that were available during my care of the patient were reviewed by me and considered in my medical decision making (see chart for details).  Patient with history of cystic fibrosis, presents to the ED for evaluation of inability to clear her mucus secretions and shortness of breath.  On home 3 L of O2, but experienced him some discomfort and increased work of breathing.   Reports some low-grade fevers cough and nasal congestion at home for the past 2-3 days.  Has used chest percussion without improvement.  It will stable here in the ED, initially tachycardic with pressure in the 90s, but this is improved, suspect tachycardia related to albuterol.  Lungs with diffuse bilateral rhonchi and decreased air movement, I imagine some degree of this is chronic but feel that is probably worsened given patient's symptoms.  No improvement after initial albuterol nebulizer.  Will get labs, start patient on an hour of continuous nebs steroids.  Patient's chest pain seems to be chronic in nature with her CF, EKG without concerning changes, low suspicion for cardiac etiology.  Labs overall unremarkable, no leukocytosis and hemoglobin is normal, mild hypokalemia at 3.3, given the patient will continue to receive albuterol which can worsen this will give her oral potassium.  CO2 is 19, no other electrolyte derangements.  Urinalysis pending.  Patient's history of cystic fibrosis, he will should likely benefit from admission we will consult hospitalist.  Spoke with Dr. Joesph Fillers who will see the patient, requests addition of influenza PCR as well as urine drug screen.  Patient discussed with Dr. Lacinda Axon who saw and evaluated the patient as well and is in agreement with plan.  Final Clinical Impressions(s) / ED Diagnoses   Final diagnoses:  Cystic fibrosis ([place])  Shortness of breath  Cough    ED Discharge Orders    None       Janet Berlin 03/10/17 1652    Nat Christen, MD 03/13/17 [phone]

## 2017-03-11 ENCOUNTER — Inpatient Hospital Stay (HOSPITAL_COMMUNITY): Payer: Medicaid Other

## 2017-03-11 LAB — BASIC METABOLIC PANEL
Anion gap: 13 (ref 5–15)
BUN: 8 mg/dL (ref 6–20)
CALCIUM: 9.4 mg/dL (ref 8.9–10.3)
CHLORIDE: 108 mmol/L (ref 101–111)
CO2: 18 mmol/L — AB (ref 22–32)
CREATININE: 0.58 mg/dL (ref 0.44–1.00)
GFR calc non Af Amer: >60 mL/min (ref >60)
GLUCOSE: 140 mg/dL — AB (ref 65–99)
Potassium: 4.2 mmol/L (ref 3.5–5.1)
Sodium: 139 mmol/L (ref 135–145)

## 2017-03-11 MED ORDER — SODIUM CHLORIDE 0.9% FLUSH: 10.0000 mL | INTRAVENOUS | Status: DC | PRN

## 2017-03-11 MED ORDER — SODIUM CHLORIDE 0.9% FLUSH: 10.0000 mL | Freq: Two times a day (BID) | INTRAVENOUS | Status: DC

## 2017-03-11 NOTE — Progress Notes (Signed)
PROGRESS NOTE    Jasmine [name]  JOA:[id number] DOB: 10-Mar-1990 DOA: 03/10/2017 PCP: Patient, No Pcp Per     Brief Narrative:  Jasmine [name] is a 27 y.o. female, with past medical history relevant for chronic hypoxic respiratory failure on 3 L of oxygen at home chronically due to underlying ?cystic fibrosis and asthma, as well as history of seizures without recent seizures, sickle cell trait and chronic pain concerns who presents to the ED with complaints of a worsening respiratory symptoms including increased sputum/mucus production, increased shortness of breath, chills, low-grade temps up to 100.1. Patient was using a percussion device at home with no relief.  Please note the patient was seen 02/14/2017 at that time she left AGAINST MEDICAL ADVICE due to family situation.  She returns now with complaint of worsening respiratory symptoms in the ED. She has been admitted for acute on chronic respiratory failure secondary to asthma exacerbation.   Assessment & Plan:   Principal Problem:   Acute on chronic respiratory failure with hypoxia (HCC) Active Problems:   GERD (gastroesophageal reflux disease)   Asthma   Seizure (HCC)   Cystic fibrosis (HCC)   Acute on chronic hypoxemic respiratory failure secondary to asthma exacerbation -Uses 3L [place] O2 at baseline -Influenza negative  -?Cystic fibrosis diagnosis. In Epic chart review, there is no mention of cystic fibrosis diagnosis by previous pulmonology consultation. First time it is mentioned in her chart is from 01/29/2017 by ED physician who mention that patient states she is being treated for possible cystic fibrosis -She also tells me that she has been on chronic zosyn IV through PICC line since June 2018 and recently stopped treatment a month ago when she got her PICC line wet. However, there are no records of her being prescribed zosyn through our computer system. She relocated from [place] 7 months ago and does not have PCP or Pulm set up  yet so unclear as to how she received this antibiotic for since June through PICC at home without home health agency  -Has appointment with  pulm Dr. Lamonte Sakai 2/28  -Stop cefepime, no concern for infectious source, CXR clear, unclear her hx of CF and zosyn usage. Asked patient to find out her physician information from [place] so we can request records  -Albuterol, breo, duonebs, solumederol, chest physiotherapy   Seizure disorder -Continue keppra   OSA -Not on CPAP at home    DVT prophylaxis: subq hep Code Status: full Family Communication: no family at bedside Disposition Plan: pending improvement   Consultants:   none  Procedures:   none  Antimicrobials:  Anti-infectives (From admission, onward)   Start     Dose/Rate Route Frequency Ordered Stop   03/11/17 0200  ceFEPIme (MAXIPIME) 2 g in sodium chloride 0.9 % 100 mL IVPB  Status:  Discontinued     2 g 200 mL/hr over 30 Minutes Intravenous Every 8 hours 03/10/17 1828 03/11/17 1210   03/10/17 1830  ceFEPIme (MAXIPIME) 2 g in sodium chloride 0.9 % 100 mL IVPB     2 g 200 mL/hr over 30 Minutes Intravenous  Once 03/10/17 1828 03/10/17 2041        Subjective: Continues to have cough, feels like it may be slightly improved since admission.    Objective: Vitals:   03/10/17 2130 03/11/17 0150 03/11/17 0450 03/11/17 0743  BP:   (!) 102/50   Pulse:   68   Resp:   (!) 28   Temp:   98.3 F (36.8  C)   TempSrc:   Oral   SpO2:  100% 99% 99%  Weight: 73.5 kg (162 lb)     Height: 5\' 4"  (1.626 m)       Intake/Output Summary (Last 24 hours) at 03/11/2017 1309 Last data filed at 03/11/2017 0415 Gross per 24 hour  Intake 1667.5 ml  Output 300 ml  Net 1367.5 ml   Filed Weights   03/10/17 2130  Weight: 73.5 kg (162 lb)    Examination:  General exam: Appears calm and comfortable  Respiratory system: Diminished breath sounds bilaterally with expiratory wheezes. Respiratory effort normal. Cardiovascular system: S1  & S2 heard, RRR. No JVD, murmurs, rubs, gallops or clicks. No pedal edema. Gastrointestinal system: Abdomen is nondistended, soft and nontender. No organomegaly or masses felt. Normal bowel sounds heard. Central nervous system: Alert and oriented. No focal neurological deficits. Extremities: Symmetric 5 x 5 power. Skin: No rashes, lesions or ulcers Psychiatry: Judgement and insight appear normal. Mood & affect appropriate.   Data Reviewed: I have personally reviewed following labs and imaging studies  CBC: Recent Labs  Lab 03/10/17 1453  WBC 7.8  NEUTROABS 4.6  HGB 12.6  HCT 38.7  MCV 93.0  PLT 423*   Basic Metabolic Panel: Recent Labs  Lab 03/10/17 1453 03/10/17 1830 03/11/17 0354  NA 138  --  139  K 3.3*  --  4.2  CL 106  --  108  CO2 19*  --  18*  GLUCOSE 88  --  140*  BUN 9  --  8  CREATININE 0.69  --  0.58  CALCIUM 9.0  --  9.4  MG  --  2.0  --    GFR: Estimated Creatinine Clearance: 105.6 mL/min (by C-G formula based on SCr of 0.58 mg/dL). Liver Function Tests: Recent Labs  Lab 03/10/17 1453  AST 24  ALT 22  ALKPHOS 53  BILITOT 0.6  PROT 7.4  ALBUMIN 4.5   No results for input(s): LIPASE, AMYLASE in the last 168 hours. No results for input(s): AMMONIA in the last 168 hours. Coagulation Profile: No results for input(s): INR, PROTIME in the last 168 hours. Cardiac Enzymes: No results for input(s): CKTOTAL, CKMB, CKMBINDEX, TROPONINI in the last 168 hours. BNP (last 3 results) No results for input(s): PROBNP in the last 8760 hours. HbA1C: No results for input(s): HGBA1C in the last 72 hours. CBG: No results for input(s): GLUCAP in the last 168 hours. Lipid Profile: No results for input(s): CHOL, HDL, LDLCALC, TRIG, CHOLHDL, LDLDIRECT in the last 72 hours. Thyroid Function Tests: No results for input(s): TSH, T4TOTAL, FREET4, T3FREE, THYROIDAB in the last 72 hours. Anemia Panel: No results for input(s): VITAMINB12, FOLATE, FERRITIN, TIBC, IRON,  RETICCTPCT in the last 72 hours. Sepsis Labs: No results for input(s): PROCALCITON, LATICACIDVEN in the last 168 hours.  No results found for this or any previous visit (from the past 240 hour(s)).     Radiology Studies: Dg Chest 2 View  Result Date: 03/10/2017 CLINICAL DATA:  Cystic fibrosis. Throat pain, chest pain, short of breath. EXAM: CHEST  2 VIEW COMPARISON:  02/14/2017 FINDINGS: Normal heart size. Lungs clear. No pneumothorax. No pleural effusion. IMPRESSION: No active cardiopulmonary disease. Electronically Signed   By: Marybelle Killings M.D.   On: 03/10/2017 13:03   Dg Chest Port 1 View  Result Date: 03/11/2017 CLINICAL DATA:  PICC line placement, cystic fibrosis EXAM: PORTABLE CHEST 1 VIEW COMPARISON:  Portable exam 1235 hours compared to 03/10/2017 FINDINGS: RIGHT arm  PICC line tip projects over SVC near cavoatrial junction. Normal heart size, mediastinal contours, and pulmonary vascularity. Lungs clear. No pleural effusion or pneumothorax. Bones unremarkable. IMPRESSION: Tip of RIGHT arm PICC line projects over SVC. No acute abnormalities. Electronically Signed   By: Lavonia Dana M.D.   On: 03/11/2017 13:01      Scheduled Meds: . fluticasone furoate-vilanterol  1 puff Inhalation Daily  . guaiFENesin  600 mg Oral BID  . heparin  5,000 Units Subcutaneous Q8H  . ipratropium-albuterol  3 mL Nebulization Q6H  . levETIRAcetam  500 mg Oral BID  . methylPREDNISolone (SOLU-MEDROL) injection  40 mg Intravenous Q12H  . pantoprazole  40 mg Oral Daily  . senna  1 tablet Oral BID  . sodium chloride flush  10-40 mL Intracatheter Q12H   Continuous Infusions:   LOS: 1 day    Time spent: 40 minutes   Dessa Phi, DO [facility] [email or web address] Password [email or web address] 03/11/2017, 1:09 PM

## 2017-03-11 NOTE — Progress Notes (Signed)
Peripherally Inserted Central Catheter/Midline Placement  The IV Nurse has discussed with the patient and/or persons authorized to consent for the patient, the purpose of this procedure and the potential benefits and risks involved with this procedure.  The benefits include less needle sticks, lab draws from the catheter, and the patient may be discharged home with the catheter. Risks include, but not limited to, infection, bleeding, blood clot (thrombus formation), and puncture of an artery; nerve damage and irregular heartbeat and possibility to perform a PICC exchange if needed/ordered by physician.  Alternatives to this procedure were also discussed.  Bard Power PICC patient education guide, fact sheet on infection prevention and patient information card has been provided to patient /or left at bedside.    PICC/Midline Placement Documentation  PICC Single Lumen 03/11/17 PICC Right Brachial 38 cm 0 cm (Active)  Indication for Insertion or Continuance of Line Home intravenous therapies (PICC only) 03/11/2017 12:35 PM  Exposed Catheter (cm) 0 cm 03/11/2017 12:35 PM  Site Assessment Clean;Dry;Intact 03/11/2017 12:35 PM  Line Status Flushed;Saline locked;Blood return noted 03/11/2017 12:35 PM  Dressing Type Transparent 03/11/2017 12:35 PM  Dressing Status Clean;Dry;Intact;Antimicrobial disc in place 03/11/2017 12:35 PM  Dressing Change Due 03/18/17 03/11/2017 12:35 PM       Jahon Bart, Nicolette Bang 03/11/2017, 12:36 PM

## 2017-03-11 NOTE — Progress Notes (Signed)
Pt called  that she was going home right now, Dr Maylene Roes paged, she talked to patient ,  orders received to remove PICC line before patient leaves,iv team was notified but patient refused to wait for iv team  to [facility]  line , patient was educated about  the risk of leaving with central line in place. Charge nurse tried to talk to patient but patient signed AMA forms and walked out of the unit. AC and Dr Maylene Roes were notified.

## 2017-03-12 NOTE — Discharge Summary (Signed)
Physician Discharge Summary  Jasmine [name] HYW:[id number] DOB: 28-May-1990 DOA: 03/10/2017  PCP: Patient, No Pcp Per  Admit date: 03/10/2017 Discharge date: 03/12/2017  Admitted From: Home Disposition:  [facility] WITH PICC LINE IN PLACE. REFUSED TO HAVE IT REMOVED AND LEFT UNIT. WOULD RECOMMEND IN FUTURE HOSPITAL ADMISSIONS THAT PICC/MIDLINE NOT BE USED; CONCERN FOR PICC LINE ABUSE.    Brief/Interim Summary: MonicaBillupsis a5 y.o.female,with past medical history relevant for chronic hypoxic respiratory failure on 3 L of oxygen at home chronically due to underlying ?cystic fibrosis and asthma, as well as history of seizures without recent seizures,sickle cell traitand chronic pain concerns who presents to the ED with complaints of a worsening respiratory symptoms including increased sputum/mucusproduction, increased shortness of breath, chills, low-grade temps up to 100.1.Patient was using a percussion device at home with no relief.Please note the patient was seen in the ED 02/14/2017 at that time she left AGAINST MEDICAL ADVICE due to family situation.Her last admission on 01/06/2017 although she did not leave AMA, she was very insistant on leaving due to family situation as well. She returns now with complaint of worsening respiratory symptoms in the ED. She has been admitted for acute on chronic respiratory failure secondary to asthma exacerbation. PICC line was placed due to poor IV access. She left AMA on 2/25.   Discharge Diagnoses:  Principal Problem:   Acute on chronic respiratory failure with hypoxia (HCC) Active Problems:   GERD (gastroesophageal reflux disease)   Asthma   Seizure (HCC)  Acute on chronic hypoxemic respiratory failure secondary to asthma exacerbation -Uses 3L [place] O2 at baseline -Influenza negative  -?Cystic fibrosis diagnosis. In Epic chart review, there is no mention of cystic fibrosis diagnosis by previous pulmonology  consultation. First time it is mentioned in her chart is from 01/29/2017 by ED physician who mention that patient states she is being treated for possible cystic fibrosis -She also tells me that she has been on chronic zosyn IV through PICC line since June 2018 and recently stopped treatment a month ago when she got her PICC line wet. However, there are no records of her being prescribed zosyn through our computer system. She relocated from [place] 7 months ago and does not have PCP or Pulm set up yet so unclear as to how she received this antibiotic for since June through PICC at home without home health agency  -Has appointment with [facility] pul[facility] Dr. Lamonte Sakai 2/28  -Stop cefepime, no concern for infectious source, CXR clear, unclear her hx of CF and zosyn usage. Asked patient to find out her physician information from [place] so we can request records  -Albuterol, breo, duonebs, solumederol, chest physiotherapy - left AMA so no outpatient prescription given.   Seizure disorder -Continue keppra   OSA -Not on CPAP at home     Discharge Instructions   Allergies as of 03/11/2017      Reactions   Cheese Shortness Of Breath   Chocolate Shortness Of Breath   Ibuprofen Hives, Shortness Of Breath   Children's ibuprofen   Ivp Dye [iodinated Diagnostic Agents] Shortness Of Breath   Latex Anaphylaxis, Swelling, Other (See Comments)   Reaction:  Localized swelling    Orange Juice [orange Oil] Shortness Of Breath   Other Hives, Other (See Comments)   Pt states that she is allergic to all steroids except IV solu-medrol.     Peach [prunus Persica] Anaphylaxis   Peanuts [peanut Oil] Anaphylaxis   Pear Anaphylaxis  Prednisone Hives   Raspberry Anaphylaxis   Tylenol [acetaminophen] Hives, Shortness Of Breath, Other (See Comments)   Pt states that this is only with the liquid form.     Amoxicillin Hives, Other (See Comments)   Has patient had a PCN reaction causing immediate rash,  facial/tongue/throat swelling, SOB or lightheadedness with hypotension: No Has patient had a PCN reaction causing severe rash involving mucus membranes or skin necrosis: No Has patient had a PCN reaction that required hospitalization: No Has patient had a PCN reaction occurring within the last 10 years: No If all of the above answers are "NO", then may proceed with Cephalosporin use.   Apricot Flavor Hives   Doxycycline Hives   Erythromycin Hives   Milk Of Magnesia [magnesium Hydroxide] Hives, Itching   Penicillins Hives, Other (See Comments)   Has patient had a PCN reaction causing immediate rash, facial/tongue/throat swelling, SOB or lightheadedness with hypotension: No Has patient had a PCN reaction causing severe rash involving mucus membranes or skin necrosis: No Has patient had a PCN reaction that required hospitalization: No Has patient had a PCN reaction occurring within the last 10 years: No If all of the above answers are "NO", then may proceed with Cephalosporin use.      Medication List    ASK your doctor about these medications   acetaminophen 325 MG tablet Commonly known as:  TYLENOL Take 325 mg by mouth every 6 (six) hours as needed for moderate pain.   albuterol 108 (90 Base) MCG/ACT inhaler Commonly known as:  PROVENTIL HFA;VENTOLIN HFA Inhale 2 puffs into the lungs every 4 (four) hours as needed for wheezing or shortness of breath.   fluticasone-salmeterol 115-21 MCG/ACT inhaler Commonly known as:  ADVAIR HFA Inhale 2 puffs into the lungs 2 (two) times daily.   ipratropium-albuterol 0.5-2.5 (3) MG/3ML Soln Commonly known as:  DUONEB Take 3 mLs by nebulization every 4 (four) hours as needed.   levETIRAcetam 500 MG tablet Commonly known as:  KEPPRA Take 1 tablet (500 mg total) by mouth 2 (two) times daily.   naproxen 375 MG tablet Commonly known as:  NAPROSYN Take 1 tablet (375 mg total) by mouth 2 (two) times daily.   NEXPLANON 68 MG Impl implant Generic  drug:  etonogestrel 1 each once by Subdermal route.   oxyCODONE 5 MG immediate release tablet Commonly known as:  ROXICODONE Take 1 tablet (5 mg total) by mouth every 4 (four) hours as needed for severe pain.   pantoprazole 20 MG tablet Commonly known as:  PROTONIX Take 1 tablet (20 mg total) by mouth daily.   predniSONE 5 MG tablet Commonly known as:  DELTASONE Take 10 tablets (50 mg total) by mouth daily with breakfast. Taper down prednisone starting from 50 mg a day, taper down by 5 mg a day down to 0 mg. for ex, today 50 mg, tomorrow 45 mg, then 40 mg the following day and etc...   promethazine 12.5 MG tablet Commonly known as:  PHENERGAN Take 1 tablet (12.5 mg total) by mouth every 6 (six) hours as needed for nausea or vomiting.       Allergies  Allergen Reactions  . Cheese Shortness Of Breath  . Chocolate Shortness Of Breath  . Ibuprofen Hives and Shortness Of Breath    Children's ibuprofen  . Ivp Dye [Iodinated Diagnostic Agents] Shortness Of Breath  . Latex Anaphylaxis, Swelling and Other (See Comments)    Reaction:  Localized swelling   . Orange Juice [Orange Oil]  Shortness Of Breath  . Other Hives and Other (See Comments)    Pt states that she is allergic to all steroids except IV solu-medrol.    Marland Kitchen Peach [Prunus Persica] Anaphylaxis  . Peanuts [Peanut Oil] Anaphylaxis  . Pear Anaphylaxis  . Prednisone Hives  . Raspberry Anaphylaxis  . Tylenol [Acetaminophen] Hives, Shortness Of Breath and Other (See Comments)    Pt states that this is only with the liquid form.    . Amoxicillin Hives and Other (See Comments)    Has patient had a PCN reaction causing immediate rash, facial/tongue/throat swelling, SOB or lightheadedness with hypotension: No Has patient had a PCN reaction causing severe rash involving mucus membranes or skin necrosis: No Has patient had a PCN reaction that required hospitalization: No Has patient had a PCN reaction occurring within the last 10  years: No If all of the above answers are "NO", then may proceed with Cephalosporin use.  Marland Kitchen Apricot Flavor Hives  . Doxycycline Hives  . Erythromycin Hives  . Milk Of Magnesia [Magnesium Hydroxide] Hives and Itching  . Penicillins Hives and Other (See Comments)    Has patient had a PCN reaction causing immediate rash, facial/tongue/throat swelling, SOB or lightheadedness with hypotension: No Has patient had a PCN reaction causing severe rash involving mucus membranes or skin necrosis: No Has patient had a PCN reaction that required hospitalization: No Has patient had a PCN reaction occurring within the last 10 years: No If all of the above answers are "NO", then may proceed with Cephalosporin use.    Consultations:  None   Procedures/Studies: Dg Chest 2 View  Result Date: 03/10/2017 CLINICAL DATA:  Cystic fibrosis. Throat pain, chest pain, short of breath. EXAM: CHEST  2 VIEW COMPARISON:  02/14/2017 FINDINGS: Normal heart size. Lungs clear. No pneumothorax. No pleural effusion. IMPRESSION: No active cardiopulmonary disease. Electronically Signed   By: Marybelle Killings M.D.   On: 03/10/2017 13:03   Dg Chest 2 View  Result Date: 02/14/2017 CLINICAL DATA:  Shortness of breath.  History of cystic fibrosis. EXAM: CHEST  2 VIEW COMPARISON:  Chest x-ray dated 01/29/2017. Chest x-ray dated 01/04/2017. FINDINGS: Heart size and mediastinal contours are within normal limits. Lungs are clear. Lung volumes are normal. No pleural effusion or pneumothorax seen. Osseous structures about the chest are unremarkable. IMPRESSION: No active cardiopulmonary disease. No evidence of pneumonia or pulmonary edema. Electronically Signed   By: Franki Cabot M.D.   On: 02/14/2017 19:41   Dg Chest Port 1 View  Result Date: 03/11/2017 CLINICAL DATA:  PICC line placement, cystic fibrosis EXAM: PORTABLE CHEST 1 VIEW COMPARISON:  Portable exam 1235 hours compared to 03/10/2017 FINDINGS: RIGHT arm PICC line tip projects over  SVC near cavoatrial junction. Normal heart size, mediastinal contours, and pulmonary vascularity. Lungs clear. No pleural effusion or pneumothorax. Bones unremarkable. IMPRESSION: Tip of RIGHT arm PICC line projects over SVC. No acute abnormalities. Electronically Signed   By: Lavonia Dana M.D.   On: 03/11/2017 13:01   Dg Foot Complete Right  Result Date: 02/26/2017 CLINICAL DATA:  Acute onset of pain across the right metatarsals. EXAM: RIGHT FOOT COMPLETE - 3+ VIEW COMPARISON:  None. FINDINGS: There is no evidence of fracture or dislocation. The joint spaces are preserved. There is no evidence of talar subluxation; the subtalar joint is unremarkable in appearance. No significant soft tissue abnormalities are seen. IMPRESSION: No evidence of fracture or dislocation. Electronically Signed   By: Garald Balding M.D.   On: 02/26/2017  03:24        Discharge Exam: Vitals:   03/11/17 0743 03/11/17 1453  BP:    Pulse:    Resp:    Temp:    SpO2: 99% 99%   Vitals:   03/11/17 0150 03/11/17 0450 03/11/17 0743 03/11/17 1453  BP:  (!) 102/50    Pulse:  68    Resp:  (!) 28    Temp:  98.3 F (36.8 C)    TempSrc:  Oral    SpO2: 100% 99% 99% 99%  Weight:      Height:       Left AMA. No exam.     The results of significant diagnostics from this hospitalization (including imaging, microbiology, ancillary and laboratory) are listed below for reference.     Microbiology: No results found for this or any previous visit (from the past 240 hour(s)).   Labs: BNP (last 3 results) No results for input(s): BNP in the last 8760 hours. Basic Metabolic Panel: Recent Labs  Lab 03/10/17 1453 03/10/17 1830 03/11/17 0354  NA 138  --  139  K 3.3*  --  4.2  CL 106  --  108  CO2 19*  --  18*  GLUCOSE 88  --  140*  BUN 9  --  8  CREATININE 0.69  --  0.58  CALCIUM 9.0  --  9.4  MG  --  2.0  --    Liver Function Tests: Recent Labs  Lab 03/10/17 1453  AST 24  ALT 22  ALKPHOS 53  BILITOT 0.6   PROT 7.4  ALBUMIN 4.5   No results for input(s): LIPASE, AMYLASE in the last 168 hours. No results for input(s): AMMONIA in the last 168 hours. CBC: Recent Labs  Lab 03/10/17 1453  WBC 7.8  NEUTROABS 4.6  HGB 12.6  HCT 38.7  MCV 93.0  PLT 137*   Cardiac Enzymes: No results for input(s): CKTOTAL, CKMB, CKMBINDEX, TROPONINI in the last 168 hours. BNP: Invalid input(s): POCBNP CBG: No results for input(s): GLUCAP in the last 168 hours. D-Dimer No results for input(s): DDIMER in the last 72 hours. Hgb A1c No results for input(s): HGBA1C in the last 72 hours. Lipid Profile No results for input(s): CHOL, HDL, LDLCALC, TRIG, CHOLHDL, LDLDIRECT in the last 72 hours. Thyroid function studies No results for input(s): TSH, T4TOTAL, T3FREE, THYROIDAB in the last 72 hours.  Invalid input(s): FREET3 Anemia work up No results for input(s): VITAMINB12, FOLATE, FERRITIN, TIBC, IRON, RETICCTPCT in the last 72 hours. Urinalysis    Component Value Date/Time   COLORURINE YELLOW 03/10/2017 1423   APPEARANCEUR CLEAR 03/10/2017 1423   LABSPEC 1.036 (H) 03/10/2017 1423   PHURINE 5.0 03/10/2017 1423   GLUCOSEU NEGATIVE 03/10/2017 1423   HGBUR NEGATIVE 03/10/2017 [place] 03/10/2017 1423   KETONESUR NEGATIVE 03/10/2017 1423   PROTEINUR NEGATIVE 03/10/2017 1423   UROBILINOGEN 1.0 11/04/2014 0946   NITRITE NEGATIVE 03/10/2017 1423   LEUKOCYTESUR NEGATIVE 03/10/2017 1423   Sepsis Labs Invalid input(s): PROCALCITONIN,  WBC,  LACTICIDVEN Microbiology No results found for this or any previous visit (from the past 240 hour(s)).    SIGNED:  Dessa Phi, DO [facility] Pager [phone]  If 7PM-7AM, please contact night-coverage [email or web address] Password [facility] 03/12/2017, 9:11 AM

## 2017-03-14 ENCOUNTER — Emergency Department (HOSPITAL_COMMUNITY)
Admission: EM | Admit: 2017-03-14 | Discharge: 2017-03-14 | Disposition: A | Discharge status: Home / Self Care | Payer: Medicaid Other | Attending: Emergency Medicine | Admitting: Emergency Medicine

## 2017-03-14 ENCOUNTER — Inpatient Hospital Stay: Payer: Self-pay | Admitting: Emergency Medicine

## 2017-03-14 ENCOUNTER — Emergency Department (HOSPITAL_COMMUNITY): Payer: Medicaid Other

## 2017-03-14 DIAGNOSIS — Z79899 Other long term (current) drug therapy: Secondary | ICD-10-CM | POA: Diagnosis not present

## 2017-03-14 DIAGNOSIS — R071 Chest pain on breathing: Secondary | ICD-10-CM | POA: Diagnosis not present

## 2017-03-14 DIAGNOSIS — B9789 Other viral agents as the cause of diseases classified elsewhere: Secondary | ICD-10-CM | POA: Insufficient documentation

## 2017-03-14 DIAGNOSIS — R0789 Other chest pain: Secondary | ICD-10-CM | POA: Diagnosis not present

## 2017-03-14 DIAGNOSIS — J069 Acute upper respiratory infection, unspecified: Secondary | ICD-10-CM | POA: Diagnosis not present

## 2017-03-14 DIAGNOSIS — Z86718 Personal history of other venous thrombosis and embolism: Secondary | ICD-10-CM | POA: Diagnosis not present

## 2017-03-14 DIAGNOSIS — R000 Tachycardia, unspecified: Secondary | ICD-10-CM | POA: Diagnosis not present

## 2017-03-14 DIAGNOSIS — Z87891 Personal history of nicotine dependence: Secondary | ICD-10-CM | POA: Insufficient documentation

## 2017-03-14 DIAGNOSIS — R05 Cough: Secondary | ICD-10-CM | POA: Diagnosis present

## 2017-03-14 DIAGNOSIS — D573 Sickle-cell trait: Secondary | ICD-10-CM | POA: Insufficient documentation

## 2017-03-14 LAB — COMPREHENSIVE METABOLIC PANEL
ALBUMIN: 4.1 g/dL (ref 3.5–5.0)
ALK PHOS: 52 U/L (ref 38–126)
ALT: 24 U/L (ref 14–54)
ANION GAP: 9 (ref 5–15)
AST: 25 U/L (ref 15–41)
BILIRUBIN TOTAL: 0.5 mg/dL (ref 0.3–1.2)
BUN: 11 mg/dL (ref 6–20)
CALCIUM: 9.5 mg/dL (ref 8.9–10.3)
CO2: 25 mmol/L (ref 22–32)
CREATININE: 0.73 mg/dL (ref 0.44–1.00)
Chloride: 103 mmol/L (ref 101–111)
GFR calc Af Amer: >60 mL/min (ref >60)
GFR calc non Af Amer: >60 mL/min (ref >60)
GLUCOSE: 93 mg/dL (ref 65–99)
Potassium: 3.2 mmol/L — ABNORMAL LOW (ref 3.5–5.1)
SODIUM: 137 mmol/L (ref 135–145)
TOTAL PROTEIN: 6.8 g/dL (ref 6.5–8.1)

## 2017-03-14 LAB — CBC WITH DIFFERENTIAL/PLATELET
BASOS PCT: 0 %
Basophils Absolute: 0 10*3/uL (ref 0.0–0.1)
Eosinophils Absolute: 0.2 10*3/uL (ref 0.0–0.7)
Eosinophils Relative: 2 %
HEMATOCRIT: 37 % (ref 36.0–46.0)
HEMOGLOBIN: 12 g/dL (ref 12.0–15.0)
LYMPHS ABS: 4.8 10*3/uL — AB (ref 0.7–4.0)
Lymphocytes Relative: 32 %
MCH: 30.1 pg (ref 26.0–34.0)
MCHC: 32.4 g/dL (ref 30.0–36.0)
MCV: 92.7 fL (ref 78.0–100.0)
MONOS PCT: 5 %
Monocytes Absolute: 0.7 10*3/uL (ref 0.1–1.0)
NEUTROS PCT: 61 %
Neutro Abs: 9.4 10*3/uL — ABNORMAL HIGH (ref 1.7–7.7)
Platelets: 264 10*3/uL (ref 150–400)
RBC: 3.99 MIL/uL (ref 3.87–5.11)
RDW: 13.6 % (ref 11.5–15.5)
WBC: 15.2 10*3/uL — AB (ref 4.0–10.5)

## 2017-03-14 LAB — LACTIC ACID, PLASMA: Lactic Acid, Venous: 1.9 mmol/L (ref 0.5–1.9)

## 2017-03-14 LAB — D-DIMER, QUANTITATIVE: D-Dimer, Quant: 0.4 ug/mL-FEU (ref 0.00–0.50)

## 2017-03-14 LAB — INFLUENZA PANEL BY PCR (TYPE A & B)
Influenza A By PCR: NEGATIVE
Influenza B By PCR: NEGATIVE

## 2017-03-14 MED ORDER — SODIUM CHLORIDE 0.9 % IV BOLUS (SEPSIS)
1000.0000 mL | Freq: Once | INTRAVENOUS | Status: AC
  Administered 2017-03-14: 1000 mL via INTRAVENOUS

## 2017-03-14 MED ORDER — IPRATROPIUM BROMIDE 0.02 % IN SOLN
0.5000 mg | Freq: Once | RESPIRATORY_TRACT | Status: AC
  Administered 2017-03-14: 0.5 mg via RESPIRATORY_TRACT
  Filled 2017-03-14: qty 2.5

## 2017-03-14 MED ORDER — ALBUTEROL SULFATE (2.5 MG/3ML) 0.083% IN NEBU
5.0000 mg | INHALATION_SOLUTION | Freq: Once | RESPIRATORY_TRACT | Status: AC
  Administered 2017-03-14: 5 mg via RESPIRATORY_TRACT
  Filled 2017-03-14: qty 6

## 2017-03-14 NOTE — Discharge Instructions (Signed)
Keep your pending appointment with [facility]. Take your nebulizer treatments regularly. Tylenol and/or ibuprofen for any fever, aches. Push fluids.

## 2017-03-14 NOTE — ED Provider Notes (Signed)
[facility] DEPT Provider Note   CSN: [id number] Arrival date & time: 03/14/17  0235     History   Chief Complaint Chief Complaint  Patient presents with  . congested    HPI Jasmine [name] is a 27 y.o. female.  Patient presents with cough, congestion, hoarse voice and fever x 1 week. She has a history of cystic fibrosis, on chronic oxygen therapy. She has been using her daily medications, including nebulizer, without relief. She feels her chest is congested but can't clear anything by coughing. There is pleuritic chest pain. No nausea, vomiting. Tmax of 101 at home. She has been seen recently for same. She has a PICC line in her right upper arm and reports she was receiving IV abx q 2 weeks as a preventative. She states the abx was Zosyn.    The history is provided by the patient. No language interpreter was used.    Past Medical History:  Diagnosis Date  . Asthma   . Complication of anesthesia    "I wake up during anesthesia" (10/14/2015)  . Cystic fibrosis ([place])   . DVT (deep vein thrombosis) in pregnancy ([place])   . Epilepsy ([place])   . GERD (gastroesophageal reflux disease)   . Heart murmur    last work up age 89- no symtoms  . Pancreatitis   . Seizures ([place])    epilepsy  . Sickle cell trait ([place])   . Sleep apnea    "used to wear CPAP; don't have one here in [place] since I moved in 2014" (10/14/2015)    Patient Active Problem List   Diagnosis Date Noted  . Cystic fibrosis ([place]) 03/10/2017  . Acute on chronic respiratory failure with hypoxia ([place]) 03/10/2017  . Colitis 11/12/2016  . Seizure ([place]) 11/12/2016  . Calculus of bile duct with acute cholecystitis with obstruction 10/14/2015  . Adjustment disorder with mixed anxiety and depressed mood 04/26/2015  . Low blood potassium 04/19/2015  . Asthma with acute exacerbation 04/19/2015  . Asthma 01/03/2015  . Asthma exacerbation 01/03/2015  . Influenza-like illness 01/03/2015  . Status post  cesarean section 11/08/2014  . Previous cesarean section complicating pregnancy, antepartum condition or complication   . GERD (gastroesophageal reflux disease)   . Gastroesophageal reflux disease without esophagitis   . Abnormal biochemical finding on antenatal screening of mother   . Abnormal quad screen   . Echogenic focus of heart of fetus affecting antepartum care of mother   . Drug use affecting pregnancy, antepartum 05/19/2014  . Seizure disorder during pregnancy, antepartum ([place]) 05/13/2014  . Supervision of high risk pregnancy, antepartum 05/13/2014  . Asthma complicating pregnancy, antepartum 05/13/2014  . History of food anaphylaxis 05/13/2014  . Seizure disorder, sept 2015 last seizure 01/19/2013    Past Surgical History:  Procedure Laterality Date  . APPENDECTOMY    . CESAREAN SECTION  2013  . CESAREAN SECTION N/A 11/08/2014   Procedure: CESAREAN SECTION;  Surgeon: Truett Mainland, DO;  Location: [facility];  Service: Obstetrics;  Laterality: N/A;  . CHOLECYSTECTOMY N/A 10/16/2015   Procedure: LAPAROSCOPIC CHOLECYSTECTOMY WITH POSSIBLE INTRAOPERATIVE CHOLANGIOGRAM;  Surgeon: Donnie Mesa, MD;  Location: [facility];  Service: General;  Laterality: N/A;  . ESOPHAGOGASTRODUODENOSCOPY (EGD) WITH PROPOFOL N/A 11/15/2016   Procedure: ESOPHAGOGASTRODUODENOSCOPY (EGD) WITH PROPOFOL;  Surgeon: Clarene Essex, MD;  Location: [facility];  Service: Endoscopy;  Laterality: N/A;  . INGUINAL HERNIA REPAIR Bilateral ~ 1996  . NERVE, TENDON AND ARTERY REPAIR Right 09/23/2012   Procedure: I&D  and Repair As Necessary/Right Hand and Palm;  Surgeon: Roseanne Kaufman, MD;  Location: [facility];  Service: Orthopedics;  Laterality: Right;  . TONSILLECTOMY      OB History    Gravida Para Term Preterm AB Living   3 3 3     3    SAB TAB Ectopic Multiple Live Births         0 3       Home Medications    Prior to Admission medications   Medication Sig Start Date End Date Taking? Authorizing Provider    acetaminophen (TYLENOL) 325 MG tablet Take 325 mg by mouth every 6 (six) hours as needed for moderate pain.   Yes [provider]  albuterol (PROVENTIL HFA;VENTOLIN HFA) 108 (90 Base) MCG/ACT inhaler Inhale 2 puffs into the lungs every 4 (four) hours as needed for wheezing or shortness of breath. 01/06/17  Yes Robbie Lis, MD  etonogestrel (NEXPLANON) 68 MG IMPL implant 1 each once by Subdermal route.   Yes [provider]  fluticasone-salmeterol (ADVAIR HFA) 115-21 MCG/ACT inhaler Inhale 2 puffs into the lungs 2 (two) times daily. 01/06/17  Yes Robbie Lis, MD  ipratropium-albuterol (DUONEB) 0.5-2.5 (3) MG/3ML SOLN Take 3 mLs by nebulization every 4 (four) hours as needed. Patient taking differently: Take 3 mLs by nebulization every 4 (four) hours as needed (sob).  01/06/17 03/14/17 Yes Robbie Lis, MD  levETIRAcetam (KEPPRA) 500 MG tablet Take 1 tablet (500 mg total) by mouth 2 (two) times daily. 01/06/17  Yes Robbie Lis, MD  naproxen (NAPROSYN) 375 MG tablet Take 1 tablet (375 mg total) by mouth 2 (two) times daily. 02/26/17  Yes Corliss Coggeshall, Nehemiah Settle, PA-C  oxyCODONE (ROXICODONE) 5 MG immediate release tablet Take 1 tablet (5 mg total) by mouth every 4 (four) hours as needed for severe pain. 06/23/16  Yes Nona Dell, PA-C  pantoprazole (PROTONIX) 20 MG tablet Take 1 tablet (20 mg total) by mouth daily. 01/06/17  Yes Robbie Lis, MD  promethazine (PHENERGAN) 12.5 MG tablet Take 1 tablet (12.5 mg total) by mouth every 6 (six) hours as needed for nausea or vomiting. 01/06/17  Yes Robbie Lis, MD  predniSONE (DELTASONE) 5 MG tablet Take 10 tablets (50 mg total) by mouth daily with breakfast. Taper down prednisone starting from 50 mg a day, taper down by 5 mg a day down to 0 mg. for ex, today 50 mg, tomorrow 45 mg, then 40 mg the following day and etc... Patient not taking: Reported on 01/29/2017 01/06/17   Robbie Lis, MD    Family History Family History   Problem Relation Age of Onset  . Cancer Mother        cervical cancer  . Asthma Mother   . Hypertension Father   . Sickle cell anemia Father   . Asthma Sister   . Diabetes Maternal Aunt   . Cancer Maternal Grandmother     Social History Social History   Tobacco Use  . Smoking status: Former Smoker    Packs/day: 0.25    Years: 4.00    Pack years: 1.00    Types: Cigarettes    Last attempt to quit: 10/02/2015    Years since quitting: 1.4  . Smokeless tobacco: Never Used  Substance Use Topics  . Alcohol use: No    Alcohol/week: 0.0 oz    Frequency: Never  . Drug use: No     Allergies   Cheese; Chocolate; Ibuprofen; Ivp dye [iodinated  diagnostic agents]; Latex; Orange juice [orange oil]; Other; Peach [prunus persica]; Peanuts [peanut oil]; Pear; Prednisone; Raspberry; Tylenol [acetaminophen]; Amoxicillin; Apricot flavor; Doxycycline; Erythromycin; Milk of magnesia [magnesium hydroxide]; and Penicillins   Review of Systems Review of Systems  Constitutional: Positive for appetite change, chills, fatigue and fever.  HENT: Positive for congestion and voice change.   Respiratory: Positive for cough, chest tightness and shortness of breath.   Cardiovascular: Negative.   Gastrointestinal: Negative.  Negative for abdominal pain, nausea and vomiting.  Musculoskeletal: Negative.   Skin: Negative.   Neurological: Positive for weakness.     Physical Exam Updated Vital Signs BP 107/76 (BP Location: Left Arm)   Pulse (!) 115   Temp 98.9 F (37.2 C) (Oral)   Resp 18   LMP 03/03/2017 (Approximate)   SpO2 100%   Physical Exam  Constitutional: She is oriented to person, place, and time. She appears well-developed and well-nourished.  HENT:  Head: Normocephalic.  Voice is hoarse.  Neck: Normal range of motion. Neck supple.  Cardiovascular: Regular rhythm. Tachycardia present.  Pulmonary/Chest: Effort normal. No respiratory distress.  Decreased air movement throughout.  Wheezing in right lobes.   Abdominal: Soft. Bowel sounds are normal. There is no tenderness. There is no rebound and no guarding.  Musculoskeletal: Normal range of motion.  Neurological: She is alert and oriented to person, place, and time.  Skin: Skin is warm and dry. No rash noted.  Psychiatric: She has a normal mood and affect.     ED Treatments / Results  Labs (all labs ordered are listed, but only abnormal results are displayed) Labs Reviewed  CBC WITH DIFFERENTIAL/PLATELET  COMPREHENSIVE METABOLIC PANEL  INFLUENZA PANEL BY PCR (TYPE A & B)  D-DIMER, QUANTITATIVE (NOT AT [facility])  LACTIC ACID, PLASMA  LACTIC ACID, PLASMA    EKG  EKG Interpretation  Date/Time:  Thursday March 14 2017 02:46:06 EST Ventricular Rate:  116 PR Interval:    QRS Duration: 68 QT Interval:  316 QTC Calculation: 439 R Axis:   64 Text Interpretation:  Sinus tachycardia Probable left atrial enlargement Nonspecific T abnormalities, diffuse leads No significant change since last tracing Confirmed by Duffy Bruce [phone]) on 03/14/2017 3:59:19 AM       Radiology Dg Chest 2 View  Result Date: 03/14/2017 CLINICAL DATA:  27 year old female with cough and congestion. EXAM: CHEST  2 VIEW COMPARISON:  Chest radiograph dated 03/11/2017 FINDINGS: Right-sided PICC with tip at the cavoatrial junction. The lungs are clear. There is no pleural effusion or pneumothorax. The cardiac silhouette is within normal limits. No acute osseous pathology. IMPRESSION: No active cardiopulmonary disease. Electronically Signed   By: Anner Crete M.D.   On: 03/14/2017 03:43    Procedures Procedures (including critical care time)  Medications Ordered in ED Medications  albuterol (PROVENTIL) (2.5 MG/3ML) 0.083% nebulizer solution 5 mg (not administered)  ipratropium (ATROVENT) nebulizer solution 0.5 mg (not administered)  sodium chloride 0.9 % bolus 1,000 mL (1,000 mLs Intravenous New Bag/Given 03/14/17 0356)      Initial Impression / Assessment and Plan / ED Course  I have reviewed the triage vital signs and the nursing notes.  Pertinent labs & imaging results that were available during my care of the patient were reviewed by me and considered in my medical decision making (see chart for details).     Patient presents with cough, congestion and chest tightness. She reports history of cystic fibrosis.   The patient appears stable. Initially tachycardic that improved during stay. CXR  is clear without evidence of cardiopulmonary process.   Chart reviewed. Per notes, there is a question as to her diagnosis of CF.   She is felt appropriate for discharge home. She reports an initial appointment with [facility] later today. She is encouraged to keep this appointment.    Final Clinical Impressions(s) / ED Diagnoses   Final diagnoses:  None   1. URI  ED Discharge Orders    None       Charlann Lange, PA-C 03/14/17 Tiana Loft, MD 03/15/17 1106

## 2017-03-14 NOTE — ED Triage Notes (Signed)
Pt complains of being congested and not being able to cough anything up

## 2017-03-18 ENCOUNTER — Encounter (HOSPITAL_COMMUNITY): Payer: Self-pay | Admitting: Emergency Medicine

## 2017-03-18 ENCOUNTER — Emergency Department (HOSPITAL_COMMUNITY)
Admission: EM | Admit: 2017-03-18 | Discharge: 2017-03-18 | Disposition: A | Discharge status: Home / Self Care | Payer: Medicaid Other | Attending: Emergency Medicine | Admitting: Emergency Medicine

## 2017-03-18 ENCOUNTER — Other Ambulatory Visit: Payer: Self-pay

## 2017-03-18 DIAGNOSIS — Z5321 Procedure and treatment not carried out due to patient leaving prior to being seen by health care provider: Secondary | ICD-10-CM | POA: Insufficient documentation

## 2017-03-18 DIAGNOSIS — Z4800 Encounter for change or removal of nonsurgical wound dressing: Secondary | ICD-10-CM | POA: Diagnosis not present

## 2017-03-18 NOTE — ED Triage Notes (Signed)
Pt st's she got her pic line dressing wet and it needs to be changed.  No other complaints

## 2017-03-18 NOTE — ED Notes (Signed)
Pt eloped without speaking to anyone

## 2017-04-07 ENCOUNTER — Emergency Department (HOSPITAL_COMMUNITY): Payer: Medicaid Other

## 2017-04-07 ENCOUNTER — Encounter (HOSPITAL_COMMUNITY): Payer: Self-pay | Admitting: *Deleted

## 2017-04-07 ENCOUNTER — Observation Stay (HOSPITAL_COMMUNITY)
Admission: EM | Admit: 2017-04-07 | Discharge: 2017-04-08 | Disposition: A | Discharge status: Home / Self Care | Payer: Medicaid Other | Attending: Internal Medicine | Admitting: Internal Medicine

## 2017-04-07 ENCOUNTER — Other Ambulatory Visit: Payer: Self-pay

## 2017-04-07 DIAGNOSIS — Z9049 Acquired absence of other specified parts of digestive tract: Secondary | ICD-10-CM | POA: Diagnosis not present

## 2017-04-07 DIAGNOSIS — R05 Cough: Principal | ICD-10-CM | POA: Insufficient documentation

## 2017-04-07 DIAGNOSIS — Z8249 Family history of ischemic heart disease and other diseases of the circulatory system: Secondary | ICD-10-CM | POA: Insufficient documentation

## 2017-04-07 DIAGNOSIS — Z809 Family history of malignant neoplasm, unspecified: Secondary | ICD-10-CM | POA: Insufficient documentation

## 2017-04-07 DIAGNOSIS — Z9981 Dependence on supplemental oxygen: Secondary | ICD-10-CM | POA: Insufficient documentation

## 2017-04-07 DIAGNOSIS — K219 Gastro-esophageal reflux disease without esophagitis: Secondary | ICD-10-CM | POA: Insufficient documentation

## 2017-04-07 DIAGNOSIS — R0989 Other specified symptoms and signs involving the circulatory and respiratory systems: Secondary | ICD-10-CM | POA: Diagnosis not present

## 2017-04-07 DIAGNOSIS — Z832 Family history of diseases of the blood and blood-forming organs and certain disorders involving the immune mechanism: Secondary | ICD-10-CM | POA: Insufficient documentation

## 2017-04-07 DIAGNOSIS — Z91018 Allergy to other foods: Secondary | ICD-10-CM | POA: Diagnosis not present

## 2017-04-07 DIAGNOSIS — Z86718 Personal history of other venous thrombosis and embolism: Secondary | ICD-10-CM | POA: Insufficient documentation

## 2017-04-07 DIAGNOSIS — D573 Sickle-cell trait: Secondary | ICD-10-CM | POA: Insufficient documentation

## 2017-04-07 DIAGNOSIS — Z87891 Personal history of nicotine dependence: Secondary | ICD-10-CM | POA: Diagnosis not present

## 2017-04-07 DIAGNOSIS — Z880 Allergy status to penicillin: Secondary | ICD-10-CM | POA: Diagnosis not present

## 2017-04-07 DIAGNOSIS — Z881 Allergy status to other antibiotic agents status: Secondary | ICD-10-CM | POA: Insufficient documentation

## 2017-04-07 DIAGNOSIS — R569 Unspecified convulsions: Secondary | ICD-10-CM

## 2017-04-07 DIAGNOSIS — G40909 Epilepsy, unspecified, not intractable, without status epilepticus: Secondary | ICD-10-CM | POA: Diagnosis not present

## 2017-04-07 DIAGNOSIS — G4730 Sleep apnea, unspecified: Secondary | ICD-10-CM | POA: Insufficient documentation

## 2017-04-07 DIAGNOSIS — Z825 Family history of asthma and other chronic lower respiratory diseases: Secondary | ICD-10-CM | POA: Diagnosis not present

## 2017-04-07 DIAGNOSIS — F419 Anxiety disorder, unspecified: Secondary | ICD-10-CM | POA: Insufficient documentation

## 2017-04-07 DIAGNOSIS — Z888 Allergy status to other drugs, medicaments and biological substances status: Secondary | ICD-10-CM | POA: Diagnosis not present

## 2017-04-07 DIAGNOSIS — Z833 Family history of diabetes mellitus: Secondary | ICD-10-CM | POA: Insufficient documentation

## 2017-04-07 DIAGNOSIS — Z79899 Other long term (current) drug therapy: Secondary | ICD-10-CM | POA: Diagnosis not present

## 2017-04-07 DIAGNOSIS — R0602 Shortness of breath: Secondary | ICD-10-CM | POA: Diagnosis not present

## 2017-04-07 DIAGNOSIS — Z91010 Allergy to peanuts: Secondary | ICD-10-CM | POA: Diagnosis not present

## 2017-04-07 DIAGNOSIS — Z8049 Family history of malignant neoplasm of other genital organs: Secondary | ICD-10-CM | POA: Insufficient documentation

## 2017-04-07 DIAGNOSIS — F4323 Adjustment disorder with mixed anxiety and depressed mood: Secondary | ICD-10-CM | POA: Diagnosis not present

## 2017-04-07 DIAGNOSIS — Z7952 Long term (current) use of systemic steroids: Secondary | ICD-10-CM | POA: Diagnosis not present

## 2017-04-07 DIAGNOSIS — Z886 Allergy status to analgesic agent status: Secondary | ICD-10-CM | POA: Diagnosis not present

## 2017-04-07 DIAGNOSIS — R059 Cough, unspecified: Secondary | ICD-10-CM | POA: Diagnosis present

## 2017-04-07 DIAGNOSIS — J45901 Unspecified asthma with (acute) exacerbation: Secondary | ICD-10-CM | POA: Diagnosis not present

## 2017-04-07 LAB — CBC WITH DIFFERENTIAL/PLATELET
Basophils Absolute: 0 10*3/uL (ref 0.0–0.1)
Basophils Relative: 0 %
EOS ABS: 0.1 10*3/uL (ref 0.0–0.7)
EOS PCT: 0 %
HCT: 38.4 % (ref 36.0–46.0)
HEMOGLOBIN: 12.5 g/dL (ref 12.0–15.0)
Lymphocytes Relative: 17 %
Lymphs Abs: 2.4 10*3/uL (ref 0.7–4.0)
MCH: 30.3 pg (ref 26.0–34.0)
MCHC: 32.6 g/dL (ref 30.0–36.0)
MCV: 93.2 fL (ref 78.0–100.0)
MONO ABS: 0.3 10*3/uL (ref 0.1–1.0)
MONOS PCT: 2 %
NEUTROS PCT: 81 %
Neutro Abs: 11.2 10*3/uL — ABNORMAL HIGH (ref 1.7–7.7)
PLATELETS: 305 10*3/uL (ref 150–400)
RBC: 4.12 MIL/uL (ref 3.87–5.11)
RDW: 13.6 % (ref 11.5–15.5)
WBC: 14 10*3/uL — ABNORMAL HIGH (ref 4.0–10.5)

## 2017-04-07 LAB — COMPREHENSIVE METABOLIC PANEL
ALK PHOS: 53 U/L (ref 38–126)
ALT: 16 U/L (ref 14–54)
ANION GAP: 9 (ref 5–15)
AST: 23 U/L (ref 15–41)
Albumin: 4.4 g/dL (ref 3.5–5.0)
BUN: 7 mg/dL (ref 6–20)
CALCIUM: 9.5 mg/dL (ref 8.9–10.3)
CO2: 22 mmol/L (ref 22–32)
Chloride: 108 mmol/L (ref 101–111)
Creatinine, Ser: 0.61 mg/dL (ref 0.44–1.00)
GFR calc non Af Amer: >60 mL/min (ref >60)
GLUCOSE: 91 mg/dL (ref 65–99)
POTASSIUM: 4.2 mmol/L (ref 3.5–5.1)
SODIUM: 139 mmol/L (ref 135–145)
Total Bilirubin: 0.2 mg/dL — ABNORMAL LOW (ref 0.3–1.2)
Total Protein: 7.6 g/dL (ref 6.5–8.1)

## 2017-04-07 LAB — INFLUENZA PANEL BY PCR (TYPE A & B)
Influenza A By PCR: NEGATIVE
Influenza B By PCR: NEGATIVE

## 2017-04-07 MED ORDER — TOBRAMYCIN 300 MG/5ML IN NEBU
300.0000 mg | INHALATION_SOLUTION | Freq: Two times a day (BID) | RESPIRATORY_TRACT | Status: DC
  Administered 2017-04-08 (×2): 300 mg via RESPIRATORY_TRACT
  Filled 2017-04-07 (×4): qty 5

## 2017-04-07 MED ORDER — IPRATROPIUM-ALBUTEROL 0.5-2.5 (3) MG/3ML IN SOLN
3.0000 mL | Freq: Four times a day (QID) | RESPIRATORY_TRACT | Status: DC
Start: 2017-04-07 — End: 2017-04-08
  Administered 2017-04-08 (×2): 3 mL via RESPIRATORY_TRACT
  Filled 2017-04-07 (×2): qty 3

## 2017-04-07 MED ORDER — ORAL CARE MOUTH RINSE
15.0000 mL | Freq: Two times a day (BID) | OROMUCOSAL | Status: DC
  Administered 2017-04-07 – 2017-04-08 (×2): 15 mL via OROMUCOSAL

## 2017-04-07 MED ORDER — MOMETASONE FURO-FORMOTEROL FUM 200-5 MCG/ACT IN AERO
2.0000 | INHALATION_SPRAY | Freq: Two times a day (BID) | RESPIRATORY_TRACT | Status: DC
  Administered 2017-04-08 (×2): 2 via RESPIRATORY_TRACT
  Filled 2017-04-07: qty 8.8

## 2017-04-07 MED ORDER — PROMETHAZINE HCL 25 MG PO TABS: 12.5000 mg | ORAL_TABLET | Freq: Four times a day (QID) | ORAL | Status: DC | PRN

## 2017-04-07 MED ORDER — PHENOL 1.4 % MT LIQD
1.0000 | OROMUCOSAL | Status: DC | PRN
Start: 2017-04-07 — End: 2017-04-08

## 2017-04-07 MED ORDER — LEVETIRACETAM 500 MG PO TABS
500.0000 mg | ORAL_TABLET | Freq: Two times a day (BID) | ORAL | Status: DC
  Administered 2017-04-07 – 2017-04-08 (×2): 500 mg via ORAL
  Filled 2017-04-07 (×2): qty 1

## 2017-04-07 MED ORDER — KCL IN DEXTROSE-NACL 20-5-0.45 MEQ/L-%-% IV SOLN
INTRAVENOUS | Status: DC
  Administered 2017-04-07 – 2017-04-08 (×2): via INTRAVENOUS
  Filled 2017-04-07 (×2): qty 1000

## 2017-04-07 MED ORDER — LORAZEPAM 2 MG/ML IJ SOLN: 1.0000 mg | INTRAMUSCULAR | Status: DC | PRN

## 2017-04-07 MED ORDER — SODIUM CHLORIDE 0.9 % IV SOLN
2.0000 g | Freq: Three times a day (TID) | INTRAVENOUS | Status: DC
  Administered 2017-04-07 – 2017-04-08 (×3): 2 g via INTRAVENOUS
  Filled 2017-04-07 (×4): qty 2

## 2017-04-07 MED ORDER — DORNASE ALFA 2.5 MG/2.5ML IN SOLN
2.5000 mg | Freq: Every day | RESPIRATORY_TRACT | Status: DC
  Administered 2017-04-08 (×2): 2.5 mg via RESPIRATORY_TRACT
  Filled 2017-04-07: qty 2.5

## 2017-04-07 MED ORDER — METHYLPREDNISOLONE SODIUM SUCC 125 MG IJ SOLR
125.0000 mg | Freq: Two times a day (BID) | INTRAMUSCULAR | Status: DC
  Administered 2017-04-08: 125 mg via INTRAVENOUS
  Filled 2017-04-07: qty 2

## 2017-04-07 MED ORDER — ALBUTEROL SULFATE (2.5 MG/3ML) 0.083% IN NEBU: 2.5000 mg | INHALATION_SOLUTION | RESPIRATORY_TRACT | Status: DC | PRN

## 2017-04-07 MED ORDER — ALBUTEROL (5 MG/ML) CONTINUOUS INHALATION SOLN
10.0000 mg/h | INHALATION_SOLUTION | Freq: Once | RESPIRATORY_TRACT | Status: AC
  Administered 2017-04-07: 10 mg/h via RESPIRATORY_TRACT
  Filled 2017-04-07: qty 20

## 2017-04-07 MED ORDER — METHYLPREDNISOLONE SODIUM SUCC 125 MG IJ SOLR
125.0000 mg | Freq: Once | INTRAMUSCULAR | Status: AC
  Administered 2017-04-07: 125 mg via INTRAVENOUS
  Filled 2017-04-07: qty 2

## 2017-04-07 MED ORDER — PANTOPRAZOLE SODIUM 20 MG PO TBEC
20.0000 mg | DELAYED_RELEASE_TABLET | Freq: Every day | ORAL | Status: DC
  Administered 2017-04-07 – 2017-04-08 (×2): 20 mg via ORAL
  Filled 2017-04-07 (×2): qty 1

## 2017-04-07 MED ORDER — SODIUM CHLORIDE 0.9 % IV SOLN
INTRAVENOUS | Status: DC
  Administered 2017-04-07: 19:00:00 via INTRAVENOUS

## 2017-04-07 MED ORDER — METHYLPREDNISOLONE SODIUM SUCC 125 MG IJ SOLR: 125.0000 mg | Freq: Once | INTRAMUSCULAR | Status: DC

## 2017-04-07 MED ORDER — IPRATROPIUM BROMIDE 0.02 % IN SOLN
0.5000 mg | Freq: Once | RESPIRATORY_TRACT | Status: AC
  Administered 2017-04-07: 0.5 mg via RESPIRATORY_TRACT
  Filled 2017-04-07: qty 2.5

## 2017-04-07 NOTE — ED Provider Notes (Signed)
[facility] DEPT Provider Note   CSN: [id number] Arrival date & time: 04/07/17  1434     History   Chief Complaint Chief Complaint  Patient presents with  . Shortness of Breath  . Anxiety    HPI Jasmine [name] is a 27 y.o. female.  27 year old female with history of cystic fibrosis presents with 2 days of worsening cough and congestion.  Patient states that she cannot clear secretions.  She is on chronic home oxygen.  No fever or chills.  No vomiting noted.  Symptoms worse with exertion and similar to her prior issues with her CF.  States compliance with her home medications.  Nothing makes her symptoms better.     Past Medical History:  Diagnosis Date  . Asthma   . Complication of anesthesia    "I wake up during anesthesia" (10/14/2015)  . Cystic fibrosis ([place])   . DVT (deep vein thrombosis) in pregnancy ([place])   . Epilepsy ([place])   . GERD (gastroesophageal reflux disease)   . Heart murmur    last work up age 29- no symtoms  . Pancreatitis   . Seizures ([place])    epilepsy  . Sickle cell trait ([place])   . Sleep apnea    "used to wear CPAP; don't have one here in [place] since I moved in 2014" (10/14/2015)    Patient Active Problem List   Diagnosis Date Noted  . Cystic fibrosis ([place]) 03/10/2017  . Acute on chronic respiratory failure with hypoxia ([place]) 03/10/2017  . Colitis 11/12/2016  . Seizure ([place]) 11/12/2016  . Calculus of bile duct with acute cholecystitis with obstruction 10/14/2015  . Adjustment disorder with mixed anxiety and depressed mood 04/26/2015  . Low blood potassium 04/19/2015  . Asthma with acute exacerbation 04/19/2015  . Asthma 01/03/2015  . Asthma exacerbation 01/03/2015  . Influenza-like illness 01/03/2015  . Status post cesarean section 11/08/2014  . Previous cesarean section complicating pregnancy, antepartum condition or complication   . GERD (gastroesophageal reflux disease)   . Gastroesophageal reflux disease  without esophagitis   . Abnormal biochemical finding on antenatal screening of mother   . Abnormal quad screen   . Echogenic focus of heart of fetus affecting antepartum care of mother   . Drug use affecting pregnancy, antepartum 05/19/2014  . Seizure disorder during pregnancy, antepartum ([place]) 05/13/2014  . Supervision of high risk pregnancy, antepartum 05/13/2014  . Asthma complicating pregnancy, antepartum 05/13/2014  . History of food anaphylaxis 05/13/2014  . Seizure disorder, sept 2015 last seizure 01/19/2013    Past Surgical History:  Procedure Laterality Date  . APPENDECTOMY    . CESAREAN SECTION  2013  . CESAREAN SECTION N/A 11/08/2014   Procedure: CESAREAN SECTION;  Surgeon: Truett Mainland, DO;  Location: [facility];  Service: Obstetrics;  Laterality: N/A;  . CHOLECYSTECTOMY N/A 10/16/2015   Procedure: LAPAROSCOPIC CHOLECYSTECTOMY WITH POSSIBLE INTRAOPERATIVE CHOLANGIOGRAM;  Surgeon: Donnie Mesa, MD;  Location: [facility];  Service: General;  Laterality: N/A;  . ESOPHAGOGASTRODUODENOSCOPY (EGD) WITH PROPOFOL N/A 11/15/2016   Procedure: ESOPHAGOGASTRODUODENOSCOPY (EGD) WITH PROPOFOL;  Surgeon: Clarene Essex, MD;  Location: [facility];  Service: Endoscopy;  Laterality: N/A;  . INGUINAL HERNIA REPAIR Bilateral ~ 1996  . NERVE, TENDON AND ARTERY REPAIR Right 09/23/2012   Procedure: I&D and Repair As Necessary/Right Hand and Palm;  Surgeon: Roseanne Kaufman, MD;  Location: [facility];  Service: Orthopedics;  Laterality: Right;  . TONSILLECTOMY       OB History    Gravida  3   Para  3   Term  3   Preterm      AB      Living  3     SAB      TAB      Ectopic      Multiple  0   Live Births  3            Home Medications    Prior to Admission medications   Medication Sig Start Date End Date Taking? Authorizing Provider  acetaminophen (TYLENOL) 325 MG tablet Take 325 mg by mouth every 6 (six) hours as needed for moderate pain.    [provider]  albuterol  (PROVENTIL HFA;VENTOLIN HFA) 108 (90 Base) MCG/ACT inhaler Inhale 2 puffs into the lungs every 4 (four) hours as needed for wheezing or shortness of breath. 01/06/17   Robbie Lis, MD  etonogestrel (NEXPLANON) 68 MG IMPL implant 1 each once by Subdermal route.    [provider]  fluticasone-salmeterol (ADVAIR HFA) 115-21 MCG/ACT inhaler Inhale 2 puffs into the lungs 2 (two) times daily. 01/06/17   Robbie Lis, MD  ipratropium-albuterol (DUONEB) 0.5-2.5 (3) MG/3ML SOLN Take 3 mLs by nebulization every 4 (four) hours as needed. Patient taking differently: Take 3 mLs by nebulization every 4 (four) hours as needed (sob).  01/06/17 03/14/17  Robbie Lis, MD  levETIRAcetam (KEPPRA) 500 MG tablet Take 1 tablet (500 mg total) by mouth 2 (two) times daily. 01/06/17   Robbie Lis, MD  naproxen (NAPROSYN) 375 MG tablet Take 1 tablet (375 mg total) by mouth 2 (two) times daily. 02/26/17   Charlann Lange, PA-C  oxyCODONE (ROXICODONE) 5 MG immediate release tablet Take 1 tablet (5 mg total) by mouth every 4 (four) hours as needed for severe pain. 06/23/16   Nona Dell, PA-C  pantoprazole (PROTONIX) 20 MG tablet Take 1 tablet (20 mg total) by mouth daily. 01/06/17   Robbie Lis, MD  predniSONE (DELTASONE) 5 MG tablet Take 10 tablets (50 mg total) by mouth daily with breakfast. Taper down prednisone starting from 50 mg a day, taper down by 5 mg a day down to 0 mg. for ex, today 50 mg, tomorrow 45 mg, then 40 mg the following day and etc... Patient not taking: Reported on 01/29/2017 01/06/17   Robbie Lis, MD  promethazine (PHENERGAN) 12.5 MG tablet Take 1 tablet (12.5 mg total) by mouth every 6 (six) hours as needed for nausea or vomiting. 01/06/17   Robbie Lis, MD    Family History Family History  Problem Relation Age of Onset  . Cancer Mother        cervical cancer  . Asthma Mother   . Hypertension Father   . Sickle cell anemia Father   . Asthma Sister   . Diabetes  Maternal Aunt   . Cancer Maternal Grandmother     Social History Social History   Tobacco Use  . Smoking status: Former Smoker    Packs/day: 0.25    Years: 4.00    Pack years: 1.00    Types: Cigarettes    Last attempt to quit: 10/02/2015    Years since quitting: 1.5  . Smokeless tobacco: Never Used  Substance Use Topics  . Alcohol use: No    Alcohol/week: 0.0 oz    Frequency: Never  . Drug use: No     Allergies   Cheese; Chocolate; Ibuprofen; Ivp dye [iodinated diagnostic agents]; Latex; Orange juice Haig Prophet  oil]; Other; Peach [prunus persica]; Peanuts [peanut oil]; Pear; Prednisone; Raspberry; Tylenol [acetaminophen]; Amoxicillin; Apricot flavor; Doxycycline; Erythromycin; Milk of magnesia [magnesium hydroxide]; and Penicillins   Review of Systems Review of Systems  All other systems reviewed and are negative.    Physical Exam Updated Vital Signs BP 104/84 (BP Location: Left Arm)   Pulse (!) 110   Temp 98.9 F (37.2 C) (Oral)   Resp 19   SpO2 100%   Physical Exam  Constitutional: She is oriented to person, place, and time. She appears well-developed and well-nourished.  Non-toxic appearance. No distress.  HENT:  Head: Normocephalic and atraumatic.  Eyes: Pupils are equal, round, and reactive to light. Conjunctivae, EOM and lids are normal.  Neck: Normal range of motion. Neck supple. No tracheal deviation present. No thyroid mass present.  Cardiovascular: Regular rhythm and normal heart sounds. Tachycardia present. Exam reveals no gallop.  No murmur heard. Pulmonary/Chest: No stridor. Tachypnea noted. She has decreased breath sounds in the right lower field and the left lower field. She has wheezes in the right lower field and the left lower field. She has no rhonchi. She has no rales.  Abdominal: Soft. Normal appearance and bowel sounds are normal. She exhibits no distension. There is no tenderness. There is no rebound and no CVA tenderness.  Musculoskeletal:  Normal range of motion. She exhibits no edema or tenderness.  Neurological: She is alert and oriented to person, place, and time. She has normal strength. No cranial nerve deficit or sensory deficit. GCS eye subscore is 4. GCS verbal subscore is 5. GCS motor subscore is 6.  Skin: Skin is warm and dry. No abrasion and no rash noted.  Psychiatric: She has a normal mood and affect. Her speech is normal and behavior is normal.  Nursing note and vitals reviewed.    ED Treatments / Results  Labs (all labs ordered are listed, but only abnormal results are displayed) Labs Reviewed  CBC WITH DIFFERENTIAL/PLATELET  COMPREHENSIVE METABOLIC PANEL    EKG None  Radiology No results found.  Procedures Procedures (including critical care time)  Medications Ordered in ED Medications  0.9 %  sodium chloride infusion (has no administration in time range)  ipratropium (ATROVENT) nebulizer solution 0.5 mg (has no administration in time range)  methylPREDNISolone sodium succinate (SOLU-MEDROL) 125 mg/2 mL injection 125 mg (has no administration in time range)  albuterol (PROVENTIL,VENTOLIN) solution continuous neb (has no administration in time range)     Initial Impression / Assessment and Plan / ED Course  I have reviewed the triage vital signs and the nursing notes.  Pertinent labs & imaging results that were available during my care of the patient were reviewed by me and considered in my medical decision making (see chart for details).     Patient given IV fluids here along with Solu-Medrol as well as a redraw.  Continues to still be dyspneic.  Chest x-ray without acute infiltrates.  Suspect that this is exacerbation of her cystic fibrosis and will admit to the hospitalist service  Final Clinical Impressions(s) / ED Diagnoses   Final diagnoses:  Cough    ED Discharge Orders    None       Lacretia Leigh, MD 04/07/17 2002

## 2017-04-07 NOTE — H&P (Addendum)
[facility] History and Physical  Jasmine [name] KXF:[id number] DOB: [birth year] DOA: 04/07/2017  Referring physician:  PCP: Patient, No Pcp Per   Chief Complaint: "I couldn't breath."  HPI: Jasmine [name] is a 27 y.o. female with past medical history significant for asthma, cystic fibrosis, venous normal embolism, seizures, pancreatitis, sickle cell trait and CPAP for sleep apnea presents to the hospital with chief complaint of shortness of breath.  Patient states that her daughter was recently diagnosed with the flu.  Patient began to feel ill about 2 days ago.  Cough and congestion.  Multiple treatments with inhalers at home are ineffective.  Patient feels like she is full of mucus.  Lots of coughing not very productive.  Denies any fevers.  Nasal stuffiness and congestion.  ED course: Patient given nebulizer and a dose of steroids.  Chest x-ray did not show acute changes.  Hospitalist consulted for admission.  Note-- patient takes Zosyn 1 week on 1 week off through her PICC line, patient is also on low-dose chronic steroids   Review of Systems:  As per HPI otherwise 10 point review of systems negative.    Past Medical History:  Diagnosis Date  . Asthma   . Complication of anesthesia    "I wake up during anesthesia" (10/14/2015)  . Cystic fibrosis ([place])   . DVT (deep vein thrombosis) in pregnancy ([place])   . Epilepsy ([place])   . GERD (gastroesophageal reflux disease)   . Heart murmur    last work up age 50- no symtoms  . Pancreatitis   . Seizures ([place])    epilepsy  . Sickle cell trait ([place])   . Sleep apnea    "used to wear CPAP; don't have one here in [place] since I moved in 2014" (10/14/2015)   Past Surgical History:  Procedure Laterality Date  . APPENDECTOMY    . CESAREAN SECTION  2013  . CESAREAN SECTION N/A 11/08/2014   Procedure: CESAREAN SECTION;  Surgeon: Truett Mainland, DO;  Location: [facility];  Service: Obstetrics;  Laterality: N/A;  . CHOLECYSTECTOMY N/A 10/16/2015     Procedure: LAPAROSCOPIC CHOLECYSTECTOMY WITH POSSIBLE INTRAOPERATIVE CHOLANGIOGRAM;  Surgeon: Donnie Mesa, MD;  Location: [facility];  Service: General;  Laterality: N/A;  . ESOPHAGOGASTRODUODENOSCOPY (EGD) WITH PROPOFOL N/A 11/15/2016   Procedure: ESOPHAGOGASTRODUODENOSCOPY (EGD) WITH PROPOFOL;  Surgeon: Clarene Essex, MD;  [facility];  Service: Endoscopy;  Laterality: N/A;  . INGUINAL HERNIA REPAIR Bilateral ~ 1996  . NERVE, TENDON AND ARTERY REPAIR Right 09/23/2012   Procedure: I&D and Repair As Necessary/Right Hand and Palm;  Surgeon: Roseanne Kaufman, MD;  Location: [facility];  Service: Orthopedics;  Laterality: Right;  . TONSILLECTOMY     Social History:  reports that she quit smoking about 18 months ago. Her smoking use included cigarettes. She has a 1.00 pack-year smoking history. She has never used smokeless tobacco. She reports that she does not drink alcohol or use drugs.  Allergies  Allergen Reactions  . Cheese Shortness Of Breath  . Chocolate Shortness Of Breath  . Ibuprofen Hives and Shortness Of Breath    Children's ibuprofen  . Ivp Dye [Iodinated Diagnostic Agents] Shortness Of Breath  . Latex Anaphylaxis, Swelling and Other (See Comments)    Reaction:  Localized swelling   . Orange Juice [Orange Oil] Shortness Of Breath  . Other Hives and Other (See Comments)    Pt states that she is allergic to all steroids except IV solu-medrol.    Marland Kitchen Peach [Prunus Persica] Anaphylaxis  .  Peanuts [Peanut Oil] Anaphylaxis  . Pear Anaphylaxis  . Prednisone Hives  . Raspberry Anaphylaxis  . Tylenol [Acetaminophen] Hives, Shortness Of Breath and Other (See Comments)    Pt states that this is only with the liquid form.    . Amoxicillin Hives and Other (See Comments)    Has patient had a PCN reaction causing immediate rash, facial/tongue/throat swelling, SOB or lightheadedness with hypotension: No Has patient had a PCN reaction causing severe rash involving mucus membranes or skin  necrosis: No Has patient had a PCN reaction that required hospitalization: No Has patient had a PCN reaction occurring within the last 10 years: No If all of the above answers are "NO", then may proceed with Cephalosporin use.  Marland Kitchen Apricot Flavor Hives  . Doxycycline Hives  . Erythromycin Hives  . Milk Of Magnesia [Magnesium Hydroxide] Hives and Itching  . Penicillins Hives and Other (See Comments)    Has patient had a PCN reaction causing immediate rash, facial/tongue/throat swelling, SOB or lightheadedness with hypotension: No Has patient had a PCN reaction causing severe rash involving mucus membranes or skin necrosis: No Has patient had a PCN reaction that required hospitalization: No Has patient had a PCN reaction occurring within the last 10 years: No If all of the above answers are "NO", then may proceed with Cephalosporin use.    Family History  Problem Relation Age of Onset  . Cancer Mother        cervical cancer  . Asthma Mother   . Hypertension Father   . Sickle cell anemia Father   . Asthma Sister   . Diabetes Maternal Aunt   . Cancer Maternal Grandmother      Prior to Admission medications   Medication Sig Start Date End Date Taking? Authorizing Provider  acetaminophen (TYLENOL) 325 MG tablet Take 325 mg by mouth every 6 (six) hours as needed for moderate pain.   Yes [provider]  albuterol (PROVENTIL HFA;VENTOLIN HFA) 108 (90 Base) MCG/ACT inhaler Inhale 2 puffs into the lungs every 4 (four) hours as needed for wheezing or shortness of breath. 01/06/17  Yes Robbie Lis, MD  etonogestrel (NEXPLANON) 68 MG IMPL implant 1 each once by Subdermal route.   Yes [provider]  fluticasone-salmeterol (ADVAIR HFA) 115-21 MCG/ACT inhaler Inhale 2 puffs into the lungs 2 (two) times daily. 01/06/17  Yes Robbie Lis, MD  ipratropium-albuterol (DUONEB) 0.5-2.5 (3) MG/3ML SOLN Take 3 mLs by nebulization every 4 (four) hours as needed. Patient taking  differently: Take 3 mLs by nebulization every 4 (four) hours as needed (sob).  01/06/17 04/07/17 Yes Robbie Lis, MD  levETIRAcetam (KEPPRA) 500 MG tablet Take 1 tablet (500 mg total) by mouth 2 (two) times daily. 01/06/17  Yes Robbie Lis, MD  OVER THE COUNTER MEDICATION Apply 1 application topically 2 (two) times daily.   Yes [provider]  oxyCODONE (ROXICODONE) 5 MG immediate release tablet Take 1 tablet (5 mg total) by mouth every 4 (four) hours as needed for severe pain. 06/23/16  Yes Nona Dell, PA-C  pantoprazole (PROTONIX) 20 MG tablet Take 1 tablet (20 mg total) by mouth daily. 01/06/17  Yes Robbie Lis, MD  POTASSIUM CHLORIDE ER PO Take by mouth daily.    Yes [provider]  promethazine (PHENERGAN) 12.5 MG tablet Take 1 tablet (12.5 mg total) by mouth every 6 (six) hours as needed for nausea or vomiting. 01/06/17  Yes Robbie Lis, MD  naproxen (NAPROSYN)  375 MG tablet Take 1 tablet (375 mg total) by mouth 2 (two) times daily. Patient not taking: Reported on 04/07/2017 02/26/17   Charlann Lange, PA-C  predniSONE (DELTASONE) 5 MG tablet Take 10 tablets (50 mg total) by mouth daily with breakfast. Taper down prednisone starting from 50 mg a day, taper down by 5 mg a day down to 0 mg. for ex, today 50 mg, tomorrow 45 mg, then 40 mg the following day and etc... Patient not taking: Reported on 01/29/2017 01/06/17   Robbie Lis, MD   Physical Exam: Vitals:   04/07/17 1827 04/07/17 1852 04/07/17 1900 04/07/17 2042  BP: 104/84  109/60 (!) 112/91  Pulse: (!) 110  91 (!) 107  Resp: 19     Temp:      TempSrc:      SpO2: 100% 100% 100% 98%    Wt Readings from Last 3 Encounters:  03/18/17 63.5 kg (140 lb)  03/10/17 73.5 kg (162 lb)  02/18/17 74.4 kg (164 lb)    General:  Appears calm and comfortable; a&Ox3; PICC line R arm Eyes:  PERRL, EOMI, normal lids, iris ENT:  grossly normal hearing, lips & tongue Neck:  no LAD, masses or  thyromegaly Cardiovascular:  RRR, no m/r/g. No LE edema.  Respiratory:  Wheeze, decr air movement, crackles. Incr WOB Abdomen:  soft, ntnd Skin:  no rash or induration seen on limited exam Musculoskeletal:  grossly normal tone BUE/BLE Psychiatric:  grossly normal mood and affect, speech fluent and appropriate Neurologic:  CN 2-12 grossly intact, moves all extremities in coordinated fashion.          Labs on Admission:  Basic Metabolic Panel: Recent Labs  Lab 04/07/17 1903  NA 139  K 4.2  CL 108  CO2 22  GLUCOSE 91  BUN 7  CREATININE 0.61  CALCIUM 9.5   Liver Function Tests: Recent Labs  Lab 04/07/17 1903  AST 23  ALT 16  ALKPHOS 53  BILITOT 0.2*  PROT 7.6  ALBUMIN 4.4   No results for input(s): LIPASE, AMYLASE in the last 168 hours. No results for input(s): AMMONIA in the last 168 hours. CBC: Recent Labs  Lab 04/07/17 1903  WBC 14.0*  NEUTROABS 11.2*  HGB 12.5  HCT 38.4  MCV 93.2  PLT 305   Cardiac Enzymes: No results for input(s): CKTOTAL, CKMB, CKMBINDEX, TROPONINI in the last 168 hours.  BNP (last 3 results) No results for input(s): BNP in the last 8760 hours.  ProBNP (last 3 results) No results for input(s): PROBNP in the last 8760 hours.   Creatinine clearance cannot be calculated (Unknown ideal weight.)  CBG: No results for input(s): GLUCAP in the last 168 hours.  Radiological Exams on Admission: Dg Chest 2 View  Result Date: 04/07/2017 CLINICAL DATA:  Cystic fibrosis. EXAM: CHEST - 2 VIEW COMPARISON:  03/14/2017 FINDINGS: The lungs are clear without focal pneumonia, edema, pneumothorax or pleural effusion. The cardiopericardial silhouette is within normal limits for size. Right PICC line tip overlies the distal SVC near the junction with the RA. The visualized bony structures of the thorax are intact. IMPRESSION: No active cardiopulmonary disease. Electronically Signed   By: Misty Stanley M.D.   On: 04/07/2017 19:27    EKG:  n/a  Assessment/Plan Active Problems:   Cystic fibrosis exacerbation (HCC)  Cystic fibrosis exacerbation Patient uses Zosyn on for a week off for a week Due to this we will do co-admin of inhaled and IV therapy Starting patient on  cefepime and inhaled tobramycin Sch IV solumedrol 3 days Prn albuterol Sch duonebs Advair-->Dulera bid Consult by pulm in AM Start Pulmozyme, pt unsure of dose IVF RT consult  GERD Cont PPI  Seizures hx Prn ativan Seizure precautions Cont keppra  Code Status: FC  DVT Prophylaxis: lovenox Family Communication: none available Disposition Plan: Pending Improvement  Status: tele, obs  Elwin Mocha, MD Family Medicine [facility] [email or web address] Password [email or web address]

## 2017-04-07 NOTE — ED Triage Notes (Signed)
Pt presents with SHOB vs anxiety. States she can not breath well but shows no distress in triage. Smells strongly of marijuana when entering room. Tearful in triage.

## 2017-04-07 NOTE — Progress Notes (Signed)
PHARMACY NOTE:  ANTIMICROBIAL DOSAGE ADJUSTMENT  Current antimicrobial regimen includes a mismatch between antimicrobial dosage and indication.  As per policy approved by the Pharmacy & Therapeutics and Medical Executive Committees, the antimicrobial dosage will be adjusted accordingly.  Current antimicrobial dosage: Cefepime 1g IV q8h  Indication: CF exacerbation  Renal Function: Serum creatinine 0.61, CrCl > 100 ml/min  Antimicrobial dosage has been changed to: Cefepime 2g IV q8h  Additional comments: noted Penicillin allergy, but has tolerated Cefepime on previous admission   Thank you for allowing pharmacy to be a part of this patient's care.  Luiz Ochoa, [facility] 04/07/2017 9:17 PM

## 2017-04-07 NOTE — ED Notes (Signed)
ED TO INPATIENT HANDOFF REPORT  Name/Age/Gender Jasmine [name] 27 y.o. female  Code Status Code Status History    Date Active Date Inactive Code Status Order ID Comments User Context   03/10/2017 1755 03/11/2017 2132 Full Code [id number]  Roxan Hockey, MD ED   01/03/2017 1057 01/06/2017 1549 Full Code [id number]  Donita Brooks, NP ED   11/12/2016 1210 11/17/2016 1735 Full Code [id number]  Elwin Mocha, MD ED   10/14/2015 1307 10/18/2015 2141 Full Code [id number]  Donnie Mesa, MD Inpatient   10/14/2015 1209 10/14/2015 1240 Full Code [id number]  Donnie Mesa, MD Outpatient   04/19/2015 2311 04/28/2015 1506 Full Code [id number]  Ivor Costa, MD ED   01/03/2015 0239 01/07/2015 1503 Full Code [id number]  Etta Quill, DO ED   11/08/2014 1220 11/11/2014 1625 Full Code [id number]  Truett Mainland, DO Inpatient   07/04/2014 0313 07/06/2014 1342 Full Code [id number]  Ivor Costa, MD Inpatient   04/07/2014 2336 04/09/2014 1458 Full Code [id number]  Lavina Hamman, MD ED   01/19/2013 0742 01/22/2013 1303 Full Code [id number]  Toy Baker, MD ED      Home/SNF/Other Home  Chief Complaint shortness of breath/heart racing  Level of Care/Admitting Diagnosis ED Disposition    ED Disposition Condition [place]: [facility] [100102]  Level of Care: Telemetry [5]  Admit to tele based on following criteria: Monitor for Ischemic changes  Diagnosis: Cystic fibrosis exacerbation [facility]) [010071]  Admitting Physician: Elwin Mocha [[id number]]  Attending Physician: Aggie Moats, Layne Benton [[id number]]  PT Class (Do Not Modify): Observation [104]  PT Acc Code (Do Not Modify): Observation [10022]       Medical History Past Medical History:  Diagnosis Date  . Asthma   . Complication of anesthesia    "I wake up during anesthesia" (10/14/2015)  . 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 DVT (deep vein thrombosis) in pregnancy ([place])   . Epilepsy ([place])   . GERD (gastroesophageal  reflux disease)   . Heart murmur    last work up age 25- no symtoms  . Pancreatitis   . Seizures ([place])    epilepsy  . Sickle cell trait ([place])   . Sleep apnea    "used to wear CPAP; don't have one here in  since I moved in 2014" (10/14/2015)    Allergies Allergies  Allergen Reactions  . Cheese Shortness Of Breath  . Chocolate Shortness Of Breath  . Ibuprofen Hives and Shortness Of Breath    Children's ibuprofen  . Ivp Dye [Iodinated Diagnostic Agents] Shortness Of Breath  . Latex Anaphylaxis, Swelling and Other (See Comments)    Reaction:  Localized swelling   . Orange Juice [Orange Oil] Shortness Of Breath  . Other Hives and Other (See Comments)    Pt states that she is allergic to all steroids except IV solu-medrol.    Marland Kitchen Peach [Prunus Persica] Anaphylaxis  . Peanuts [Peanut Oil] Anaphylaxis  . Pear Anaphylaxis  . Prednisone Hives  . Raspberry Anaphylaxis  . Tylenol [Acetaminophen] Hives, Shortness Of Breath and Other (See Comments)    Pt states that this is only with the liquid form.    . Amoxicillin Hives and Other (See Comments)    Has patient had a PCN reaction causing immediate rash, facial/tongue/throat swelling, SOB or lightheadedness with hypotension: No Has patient had a PCN reaction causing severe rash involving mucus membranes or skin necrosis: No Has patient had a PCN reaction that required  hospitalization: No Has patient had a PCN reaction occurring within the last 10 years: No If all of the above answers are "NO", then may proceed with Cephalosporin use.  Marland Kitchen Apricot Flavor Hives  . Doxycycline Hives  . Erythromycin Hives  . Milk Of Magnesia [Magnesium Hydroxide] Hives and Itching  . Penicillins Hives and Other (See Comments)    Has patient had a PCN reaction causing immediate rash, facial/tongue/throat swelling, SOB or lightheadedness with hypotension: No Has patient had a PCN reaction causing severe rash involving mucus membranes or skin necrosis: No Has  patient had a PCN reaction that required hospitalization: No Has patient had a PCN reaction occurring within the last 10 years: No If all of the above answers are "NO", then may proceed with Cephalosporin use.    IV Location/Drains/Wounds Patient Lines/Drains/Airways Status   Active Line/Drains/Airways    Name:   Placement date:   Placement time:   Site:   Days:   PICC Single Lumen 03/11/17 PICC Right Brachial 38 cm 0 cm   03/11/17    1234    Brachial   27          Labs/Imaging Results for orders placed or performed during the hospital encounter of 04/07/17 (from the past 48 hour(s))  CBC with Differential/Platelet     Status: Abnormal   Collection Time: 04/07/17  7:03 PM  Result Value Ref Range   WBC 14.0 (H) 4.0 - 10.5 K/uL   RBC 4.12 3.87 - 5.11 MIL/uL   Hemoglobin 12.5 12.0 - 15.0 g/dL   HCT 38.4 36.0 - 46.0 %   MCV 93.2 78.0 - 100.0 fL   MCH 30.3 26.0 - 34.0 pg   MCHC 32.6 30.0 - 36.0 g/dL   RDW 13.6 11.5 - 15.5 %   Platelets 305 150 - 400 K/uL   Neutrophils Relative % 81 %   Neutro Abs 11.2 (H) 1.7 - 7.7 K/uL   Lymphocytes Relative 17 %   Lymphs Abs 2.4 0.7 - 4.0 K/uL   Monocytes Relative 2 %   Monocytes Absolute 0.3 0.1 - 1.0 K/uL   Eosinophils Relative 0 %   Eosinophils Absolute 0.1 0.0 - 0.7 K/uL   Basophils Relative 0 %   Basophils Absolute 0.0 0.0 - 0.1 K/uL    Comment: Performed at [facility], [place] [address], [place], [place] [address]  Comprehensive metabolic panel     Status: Abnormal   Collection Time: 04/07/17  7:03 PM  Result Value Ref Range   Sodium 139 135 - 145 mmol/L   Potassium 4.2 3.5 - 5.1 mmol/L   Chloride 108 101 - 111 mmol/L   CO2 22 22 - 32 mmol/L   Glucose, Bld 91 65 - 99 mg/dL   BUN 7 6 - 20 mg/dL   Creatinine, Ser 0.61 0.44 - 1.00 mg/dL   Calcium 9.5 8.9 - 10.3 mg/dL   Total Protein 7.6 6.5 - 8.1 g/dL   Albumin 4.4 3.5 - 5.0 g/dL   AST 23 15 - 41 U/L   ALT 16 14 - 54 U/L   Alkaline Phosphatase 53 38 - 126 U/L    Total Bilirubin 0.2 (L) 0.3 - 1.2 mg/dL   GFR calc non Af Amer >60 >60 mL/min   GFR calc Af Amer >60 >60 mL/min    Comment: (NOTE) The eGFR has been calculated using the CKD EPI equation. This calculation has not been validated in all clinical situations. eGFR's persistently <60 mL/min signify possible  Chronic Kidney Disease.    Anion gap 9 5 - 15    Comment: Performed at [facility], [place] [address], [place], [place] [address]   Dg Chest 2 View  Result Date: 04/07/2017 CLINICAL DATA:  Cystic fibrosis. EXAM: CHEST - 2 VIEW COMPARISON:  03/14/2017 FINDINGS: The lungs are clear without focal pneumonia, edema, pneumothorax or pleural effusion. The cardiopericardial silhouette is within normal limits for size. Right PICC line tip overlies the distal SVC near the junction with the RA. The visualized bony structures of the thorax are intact. IMPRESSION: No active cardiopulmonary disease. Electronically Signed   By: Misty Stanley M.D.   On: 04/07/2017 19:27    Pending Labs Unresulted Labs (From admission, onward)   None      Vitals/Pain Today's Vitals   04/07/17 1827 04/07/17 1840 04/07/17 1852 04/07/17 1900  BP: 104/84   109/60  Pulse: (!) 110   91  Resp: 19     Temp:      TempSrc:      SpO2: 100%  100% 100%  PainSc:  8       Isolation Precautions No active isolations  Medications Medications  0.9 %  sodium chloride infusion ( Intravenous New Bag/Given 04/07/17 1856)  ipratropium (ATROVENT) nebulizer solution 0.5 mg (0.5 mg Nebulization Given 04/07/17 1851)  methylPREDNISolone sodium succinate (SOLU-MEDROL) 125 mg/2 mL injection 125 mg (125 mg Intravenous Given 04/07/17 1900)  albuterol (PROVENTIL,VENTOLIN) solution continuous neb (10 mg/hr Nebulization Given 04/07/17 1852)    Mobility walks

## 2017-04-08 DIAGNOSIS — J45901 Unspecified asthma with (acute) exacerbation: Secondary | ICD-10-CM

## 2017-04-08 DIAGNOSIS — R569 Unspecified convulsions: Secondary | ICD-10-CM | POA: Diagnosis not present

## 2017-04-08 DIAGNOSIS — R0602 Shortness of breath: Secondary | ICD-10-CM

## 2017-04-08 DIAGNOSIS — R05 Cough: Secondary | ICD-10-CM

## 2017-04-08 LAB — RESPIRATORY PANEL BY PCR
ADENOVIRUS-RVPPCR: NOT DETECTED
Bordetella pertussis: NOT DETECTED
CORONAVIRUS 229E-RVPPCR: NOT DETECTED
CORONAVIRUS HKU1-RVPPCR: NOT DETECTED
CORONAVIRUS OC43-RVPPCR: NOT DETECTED
Chlamydophila pneumoniae: NOT DETECTED
Coronavirus NL63: NOT DETECTED
Influenza A: NOT DETECTED
Influenza B: NOT DETECTED
METAPNEUMOVIRUS-RVPPCR: NOT DETECTED
MYCOPLASMA PNEUMONIAE-RVPPCR: NOT DETECTED
PARAINFLUENZA VIRUS 1-RVPPCR: NOT DETECTED
PARAINFLUENZA VIRUS 2-RVPPCR: NOT DETECTED
Parainfluenza Virus 3: NOT DETECTED
Parainfluenza Virus 4: NOT DETECTED
RESPIRATORY SYNCYTIAL VIRUS-RVPPCR: NOT DETECTED
Rhinovirus / Enterovirus: NOT DETECTED

## 2017-04-08 MED ORDER — FLUTICASONE-SALMETEROL 115-21 MCG/ACT IN AERO: 2.0000 | INHALATION_SPRAY | Freq: Two times a day (BID) | RESPIRATORY_TRACT | 1 refills | Status: DC

## 2017-04-08 MED ORDER — METHOCARBAMOL 500 MG PO TABS
500.0000 mg | ORAL_TABLET | Freq: Three times a day (TID) | ORAL | Status: DC | PRN
  Administered 2017-04-08: 500 mg via ORAL
  Filled 2017-04-08: qty 1

## 2017-04-08 MED ORDER — METHYLPREDNISOLONE SODIUM SUCC 40 MG IJ SOLR: 40.0000 mg | Freq: Two times a day (BID) | INTRAMUSCULAR | Status: DC

## 2017-04-08 MED ORDER — ENOXAPARIN SODIUM 40 MG/0.4ML ~~LOC~~ SOLN
40.0000 mg | Freq: Every day | SUBCUTANEOUS | Status: DC
  Administered 2017-04-08: 40 mg via SUBCUTANEOUS
  Filled 2017-04-08: qty 0.4

## 2017-04-08 MED ORDER — SODIUM CHLORIDE 0.9% FLUSH: 10.0000 mL | INTRAVENOUS | Status: DC | PRN

## 2017-04-08 MED ORDER — PREDNISONE 10 MG PO TABS: ORAL_TABLET | ORAL | 0 refills | Status: DC

## 2017-04-08 NOTE — Progress Notes (Signed)
Pt discharged, states her ride is downstairs, Pt become very anxious WALKED OUT [place] WITHOUT OXYGEN AND CLINICALLY PRESENTED WITHOUT SIGNs OF SHORTNESS OF BREATH, WITH a VAPOR pen IN HAND. Pt oxygen sat 100% on 2 liters of oxygen.  Asked pt about home oxygen, states. " I am OK and will get it when I  get home, I have to go my brother is down stairs. Refused to wait on order for Nebulizer machine, states she will go to her doctor in [place] and he can give her prescriptions. Pt also refused to have PICC line removed right right arm, PICC line was inserted prior to admission, pt states PICC line was inserted from a clinic in [place], [place]. Pt mother  very bilergerent and talking inappropriate thorough the morning, stating she will be consulting her attorney concerning the care the pt has received. Pt mother has made several phones refusing to cooperate with staff and telling her daughter she is not to get out any additional information concerning previous admission from [place]. Pt spoke with the -Hospitalist and Critical Care Pulmunlogist, along with the Director of the Department. Pt become very anxious, discharge papers reviewed did not sign the paperwork. Pt walked out the hospital unattended. Note: request for medical records from Hospital in [place] release for the papers to be faxed to the hospital. Called Dr. Cassell Smiles office number are [phone] no records are available, [place] office called [phone], not records are available. Pt then states she also went to a allergy clinic Dr. Rosalee Kaufman [phone], no records are available. SRP, RN

## 2017-04-08 NOTE — Progress Notes (Signed)
PHARMACY CONSULT: Lovenox for VTE prophylaxis   Wt: 75.9 kg BMI:  28.7 Scr:  0.61 CrCl >30 ml/hr  H/H: 12.5 / 38.4 Pltc: 305  Plan: Lovenox 40mg  SQ q24h Pharmacy to s/o consult.  Gretta Arab PharmD, BCPS Pager [phone] 04/08/2017 8:53 AM

## 2017-04-08 NOTE — Progress Notes (Signed)
Pt. Refused to have PICC removed per order. Patient stated that it was placed for outpatient antibiotics and was placed outside the [facility]. She decided to leave AMA.

## 2017-04-08 NOTE — Consult Note (Signed)
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  Patient gets her care at allergy and immunology clinic of [facility], not sure where that is.  Patient is too preoccupied with her mother over the phone complaining that we are not getting her medical records and it should not be her job to bring [place] medical records to answer too many questions but from what I gathered, she was diagnosed with CF in [place] as a child and her usual routine is to do chest PT daily as well as regiment above and take her pancreatic enzymes.  On the day of presentation she started having more SOB and was unable to bring up phlegm and felt it was time to come to the hospital.  She denied any fever or chills.  She reports that she feels back to normal now and would like to go home.  Her O2 sat have been 98-100 on 2L [employer] and she feels less winded.   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  Patient gets her care at allergy and immunology clinic of [facility], not sure where that is.  Patient is too preoccupied with her mother over the phone complaining that we are not getting her medical records and it should not be her job to bring [place]  medical records to answer too many questions but from what I gathered, she was diagnosed with CF in [place] as a child and her usual routine is to do chest PT daily as well as regiment above and take her pancreatic enzymes.  On the day of presentation she started having more SOB and was unable to bring up phlegm and felt it was time to come to the hospital.  She denied any fever or chills.  She reports that she feels back to normal now and would like to go home.  Her O2 sat have been 98-100 on 2L [place] and she feels less winded.   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  has a past surgical history that includes Nerve, tendon and artery repair (Right, 09/23/2012); Tonsillectomy; Cesarean section (2013); Appendectomy; Cesarean section (N/A, 11/08/2014); Inguinal hernia repair (Bilateral, ~ 1996); Cholecystectomy (N/A, 10/16/2015); and Esophagogastroduodenoscopy (egd) with propofol (N/A, 11/15/2016). Prior to Admission medications   Medication Sig Start Date End Date Taking? Authorizing Provider  acetaminophen (TYLENOL) 325 MG tablet Take 325 mg by mouth every 6 (six) hours as needed for moderate pain.   Yes [provider]  albuterol (PROVENTIL HFA;VENTOLIN  HFA) 108 (90 Base) MCG/ACT inhaler Inhale 2 puffs into the lungs every 4 (four) hours as needed for wheezing or shortness of breath. 01/06/17  Yes Robbie Lis, MD  etonogestrel (NEXPLANON) 68 MG IMPL implant 1 each once by Subdermal route.   Yes [provider]  ipratropium-albuterol (DUONEB) 0.5-2.5 (3) MG/3ML SOLN Take 3 mLs by nebulization every 4 (four) hours as needed. Patient taking differently: Take 3 mLs by nebulization every 4 (four) hours as needed (sob).  01/06/17  04/07/17 Yes Robbie Lis, MD  levETIRAcetam (KEPPRA) 500 MG tablet Take 1 tablet (500 mg total) by mouth 2 (two) times daily. 01/06/17  Yes Robbie Lis, MD  OVER THE COUNTER MEDICATION Apply 1 application topically 2 (two) times daily.   Yes [provider]  oxyCODONE (ROXICODONE) 5 MG immediate release tablet Take 1 tablet (5 mg total) by mouth every 4 (four) hours as needed for severe pain. 06/23/16  Yes Nona Dell, PA-C  pantoprazole (PROTONIX) 20 MG tablet Take 1 tablet (20 mg total) by mouth daily. 01/06/17  Yes Robbie Lis, MD  POTASSIUM CHLORIDE ER PO Take by mouth daily.    Yes [provider]  promethazine (PHENERGAN) 12.5 MG tablet Take 1 tablet (12.5 mg total) by mouth every 6 (six) hours as needed for nausea or vomiting. 01/06/17  Yes Robbie Lis, MD  fluticasone-salmeterol (ADVAIR HFA) [phone] MCG/ACT inhaler Inhale 2 puffs into the lungs 2 (two) times daily. 04/08/17   Charlynne Cousins, MD  naproxen (NAPROSYN) 375 MG tablet Take 1 tablet (375 mg total) by mouth 2 (two) times daily. Patient not taking: Reported on 04/07/2017 02/26/17   Charlann Lange, PA-C  predniSONE (DELTASONE) 10 MG tablet Takes 6 tablets for 1 days, then 5 tablets for 1 days, then 4 tablets for 1 days, then 3 tablets for 1 days, then 2 tabs for 1 days, then 1 tab for 1 days, and then stop. 04/08/17   Charlynne Cousins, MD  predniSONE (DELTASONE) 5 MG tablet Take 10 tablets (50 mg total) by mouth daily with breakfast. Taper down prednisone starting from 50 mg a day, taper down by 5 mg a day down to 0 mg. for ex, today 50 mg, tomorrow 45 mg, then 40 mg the following day and etc... Patient not taking: Reported on 01/29/2017 01/06/17   Robbie Lis, MD   Allergies  Allergen Reactions  . Cheese Shortness Of Breath  . Chocolate Shortness Of Breath  . Ibuprofen Hives and Shortness Of Breath    Children's ibuprofen  . Ivp Dye [Iodinated Diagnostic Agents] Shortness Of Breath    . Latex Anaphylaxis, Swelling and Other (See Comments)    Reaction:  Localized swelling   . Orange Juice [Orange Oil] Shortness Of Breath  . Other Hives and Other (See Comments)    Pt states that she is allergic to all steroids except IV solu-medrol.    Marland Kitchen Peach [Prunus Persica] Anaphylaxis  . Peanuts [Peanut Oil] Anaphylaxis  . Pear Anaphylaxis  . Prednisone Hives  . Raspberry Anaphylaxis  . Tylenol [Acetaminophen] Hives, Shortness Of Breath and Other (See Comments)    Pt states that this is only with the liquid form.    . Amoxicillin Hives and Other (See Comments)    Has patient had a PCN reaction causing immediate rash, facial/tongue/throat swelling, SOB or lightheadedness with hypotension: No Has patient had a PCN reaction causing severe rash involving mucus membranes or skin necrosis: No Has patient  had a PCN reaction that required hospitalization: No Has patient had a PCN reaction occurring within the last 10 years: No If all of the above answers are "NO", then may proceed with Cephalosporin use.  Marland Kitchen Apricot Flavor Hives  . Doxycycline Hives  . Erythromycin Hives  . Milk Of Magnesia [Magnesium Hydroxide] Hives and Itching  . Penicillins Hives and Other (See Comments)    Has patient had a PCN reaction causing immediate rash, facial/tongue/throat swelling, SOB or lightheadedness with hypotension: No Has patient had a PCN reaction causing severe rash involving mucus membranes or skin necrosis: No Has patient had a PCN reaction that required hospitalization: No Has patient had a PCN reaction occurring within the last 10 years: No If all of the above answers are "NO", then may proceed with Cephalosporin use.    FAMILY HISTORY:  family history includes Asthma in her mother and sister; Cancer in her maternal grandmother and mother; Diabetes in her maternal aunt; Hypertension in her father; Sickle cell anemia in her father. SOCIAL HISTORY:  reports that she quit smoking about 18 months  ago. Her smoking use included cigarettes. She has a 1.00 pack-year smoking history. She has never used smokeless tobacco. She reports that she does not drink alcohol or use drugs.  REVIEW OF SYSTEMS:   Constitutional: Negative for fever, chills, weight loss, malaise/fatigue and diaphoresis.  HENT: Negative for hearing loss, ear pain, nosebleeds, congestion, sore throat, neck pain, tinnitus and ear discharge.   Eyes: Negative for blurred vision, double vision, photophobia, pain, discharge and redness.  Respiratory: Negative for cough, hemoptysis, sputum production, shortness of breath, wheezing and stridor.   Cardiovascular: Negative for chest pain, palpitations, orthopnea, claudication, leg swelling and PND.  Gastrointestinal: Negative for heartburn, nausea, vomiting, abdominal pain, diarrhea, constipation, blood in stool and melena.  Genitourinary: Negative for dysuria, urgency, frequency, hematuria and flank pain.  Musculoskeletal: Negative for myalgias, back pain, joint pain and falls.  Skin: Negative for itching and rash.  Neurological: Negative for dizziness, tingling, tremors, sensory change, speech change, focal weakness, seizures, loss of consciousness, weakness and headaches.  Endo/Heme/Allergies: Negative for environmental allergies and polydipsia. Does not bruise/bleed easily.  SUBJECTIVE:   VITAL SIGNS: Temp:  [98.2 F (36.8 C)-98.9 F (37.2 C)] 98.2 F (36.8 C) (03/25 0514) Pulse Rate:  [87-126] 87 (03/25 0514) Resp:  [16-20] 16 (03/25 0514) BP: (100-142)/(60-110) 100/83 (03/25 0514) SpO2:  [98 %-100 %] 98 % (03/25 0750) Weight:  [167 lb 4.8 oz (75.9 kg)] 167 lb 4.8 oz (75.9 kg) (03/24 2202)  PHYSICAL EXAMINATION: General:  Well appearing, NAD Neuro:  Alert and interactive, moving all ext to command HEENT:  [place]/AT, PERRL, EOM-I and MMM Cardiovascular:  RRR, Nl S1/S2 and -M/R/G. Lungs:  CTA bilaterally Abdomen:  Soft, NT, ND and +BS Musculoskeletal:  -edema and  -tenderness Skin:  Intact  Recent Labs  Lab 04/07/17 1903  NA 139  K 4.2  CL 108  CO2 22  BUN 7  CREATININE 0.61  GLUCOSE 91   Recent Labs  Lab 04/07/17 1903  HGB 12.5  HCT 38.4  WBC 14.0*  PLT 305   Dg Chest 2 View  Result Date: 04/07/2017 CLINICAL DATA:  Cystic fibrosis. EXAM: CHEST - 2 VIEW COMPARISON:  03/14/2017 FINDINGS: The lungs are clear without focal pneumonia, edema, pneumothorax or pleural effusion. The cardiopericardial silhouette is within normal limits for size. Right PICC line tip overlies the distal SVC near the junction with the RA. The visualized bony structures of the  thorax are intact. IMPRESSION: No active cardiopulmonary disease. Electronically Signed   By: Misty Stanley M.D.   On: 04/07/2017 19:27   I reviewed abdominal CT myself and reviewed the lung cuts, completely normal.  CXR from this admission that I reviewed myself is completely normal.  ASSESSMENT / PLAN:  27 year old presumed CF patient that present with SOB.  I see no indications of CF in this patient.  I would be very interested in reviewing her records to ID the testing source.  The patient feels that she is back to baseline and would like to go home.  I discussed the case with Dr. Lamonte Sakai (primary pulmonologist) and Dr. Oneida Arenas (admitting MD).  After collaborating, there is nothing here holding patient from discharge specially that she really does want to go home.  Discussed with MDs above.  Hypoxemia: resolved, back on home O2 level  - Titrate O2 for sat of 92-95%  - As outpatient may need a titration study  CF: unsure of history here, Dr. Olevia Bowens and myself asked for records but none arrived  - Obtain records and bring to the office to see Dr. Lamonte Sakai on May 2nd, 2019 at 1:30 PM  - Do not give a script for inhaled abx as it is being supplied by a speciality clinic and I do not wish to change their plan  Asthma:  - Give script for pulmicort  - Give script for albuterol  Allergies:  -  Give script for singulair upon discharge  Apt with Dr. Lamonte Sakai at 05/16/2017 at 1:30 PM  PCCM will sign off, please call back if needed.  Rush Farmer, M.D. [facility] Pulmonary/Critical Care Medicine. Pager: [phone]. After hours pager: [phone].  04/08/2017, 2:40 PM

## 2017-04-08 NOTE — Progress Notes (Signed)
[facility] PROGRESS NOTE    Progress Note  Jasmine [name]  SWF:[id number] DOB: 1990-11-04 DOA: 04/07/2017 PCP: Patient, No Pcp Per     Brief Narrative:   Jasmine [name] is an 27 y.o. female past medical history significant for asthma, PE seizure comes into the hospital for shortness of breath that began 2 days prior to admission with productive cough  Assessment/Plan:   Shortness of breath and asthma: She is saturating well, will try to wean her off oxygen. We will continue IV empiric antibiotics and steroids with inhalers. 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 DC all of her narcotic start her on Robaxin. DC Ativan check a UDS.  Seizure [facility]): Events continue Keppra.    DVT prophylaxis: lovenox Family Communication:mother over the phone Disposition Plan/Barrier to D/C: home in 1-2 days Code Status:  Code Status History    Date Active Date Inactive Code Status Order ID Comments User Context   03/10/2017 1755 03/11/2017 2132 Full Code [id number]  Roxan Hockey, MD ED   01/03/2017 1057 01/06/2017 1549 Full Code [id number]  Donita Brooks, NP ED   11/12/2016 1210 11/17/2016 1735 Full Code [id number]  Elwin Mocha, MD ED   10/14/2015 1307 10/18/2015 2141 Full Code [id number]  Donnie Mesa, MD Inpatient   10/14/2015 1209 10/14/2015 1240 Full Code [id number]  Donnie Mesa, MD Outpatient   04/19/2015 2311 04/28/2015 1506 Full Code [id number]  Ivor Costa, MD ED   01/03/2015 0239 01/07/2015 1503 Full Code [id number]  Etta Quill, DO ED   11/08/2014 1220 11/11/2014 1625 Full Code [id number]  Truett Mainland, DO Inpatient   07/04/2014 0313 07/06/2014 1342 Full Code [id number]  Ivor Costa, MD Inpatient   04/07/2014 2336 04/09/2014 1458 Full Code [id number]  Lavina Hamman, MD ED   01/19/2013 0742 01/22/2013 1303 Full Code [id number]  Toy Baker, MD ED    Advance Directive Documentation     Most Recent Value  Type of Advance  Directive  Healthcare Power of Attorney  Pre-existing out of facility DNR order (yellow form or pink MOST form)  -  "MOST" Form in Place?  -        IV Access:    Peripheral IV   Procedures and diagnostic studies:   Dg Chest 2 View  Result Date: 04/07/2017 CLINICAL DATA:  Cystic fibrosis. EXAM: CHEST - 2 VIEW COMPARISON:  03/14/2017 FINDINGS: The lungs are clear without focal pneumonia, edema, pneumothorax or pleural effusion. The cardiopericardial silhouette is within normal limits for size. Right PICC line tip overlies the distal SVC near the junction with the RA. The visualized bony structures of the thorax are intact. IMPRESSION: No active cardiopulmonary disease. Electronically Signed   By: Misty Stanley M.D.   On: 04/07/2017 19:27     Medical Consultants:    None.  Anti-Infectives:   IV cefepime and inhaled tobramycin  Subjective:    Jasmine [name] patient continues to have a productive cough.  Objective:    Vitals:   04/07/17 2202 04/08/17 0001 04/08/17 0514 04/08/17 0750  BP: (!) 120/93  100/83   Pulse: 91  87   Resp:   16   Temp: 98.5 F (36.9 C)  98.2 F (36.8 C)   TempSrc: Oral  Oral   SpO2: 100% 99% 100% 98%  Weight: 75.9 kg (167 lb 4.8 oz)     Height: 5\' 4"  (1.626 m)       Intake/Output Summary (Last 24  hours) at 04/08/2017 0850 Last data filed at 04/08/2017 0600 Gross per 24 hour  Intake 1181.25 ml  Output -  Net 1181.25 ml   Filed Weights   04/07/17 2202  Weight: 75.9 kg (167 lb 4.8 oz)    Exam: General exam: In no acute distress. Respiratory system: Good air movement and clear to auscultation no appreciated wheezing. Cardiovascular system: S1 & S2 heard, RRR.  Gastrointestinal system: Abdomen is nondistended, soft and nontender.  Central nervous system: Alert and oriented. No focal neurological deficits. Extremities: No pedal edema. Skin: No rashes, lesions or ulcers Psychiatry: Judgement and insight appear normal. Mood & affect  appropriate.    Data Reviewed:    Labs: Basic Metabolic Panel: Recent Labs  Lab 04/07/17 1903  NA 139  K 4.2  CL 108  CO2 22  GLUCOSE 91  BUN 7  CREATININE 0.61  CALCIUM 9.5   GFR Estimated Creatinine Clearance: 107.3 mL/min (by C-G formula based on SCr of 0.61 mg/dL). Liver Function Tests: Recent Labs  Lab 04/07/17 1903  AST 23  ALT 16  ALKPHOS 53  BILITOT 0.2*  PROT 7.6  ALBUMIN 4.4   No results for input(s): LIPASE, AMYLASE in the last 168 hours. No results for input(s): AMMONIA in the last 168 hours. Coagulation profile No results for input(s): INR, PROTIME in the last 168 hours.  CBC: Recent Labs  Lab 04/07/17 1903  WBC 14.0*  NEUTROABS 11.2*  HGB 12.5  HCT 38.4  MCV 93.2  PLT 305   Cardiac Enzymes: No results for input(s): CKTOTAL, CKMB, CKMBINDEX, TROPONINI in the last 168 hours. BNP (last 3 results) No results for input(s): PROBNP in the last 8760 hours. CBG: No results for input(s): GLUCAP in the last 168 hours. D-Dimer: No results for input(s): DDIMER in the last 72 hours. Hgb A1c: No results for input(s): HGBA1C in the last 72 hours. Lipid Profile: No results for input(s): CHOL, HDL, LDLCALC, TRIG, CHOLHDL, LDLDIRECT in the last 72 hours. Thyroid function studies: No results for input(s): TSH, T4TOTAL, T3FREE, THYROIDAB in the last 72 hours.  Invalid input(s): FREET3 Anemia work up: No results for input(s): VITAMINB12, FOLATE, FERRITIN, TIBC, IRON, RETICCTPCT in the last 72 hours. Sepsis Labs: Recent Labs  Lab 04/07/17 1903  WBC 14.0*   Microbiology No results found for this or any previous visit (from the past 240 hour(s)).   Medications:   . dornase alpha  2.5 mg Nebulization Daily  . ipratropium-albuterol  3 mL Nebulization QID  . levETIRAcetam  500 mg Oral BID  . mouth rinse  15 mL Mouth Rinse BID  . methylPREDNISolone (SOLU-MEDROL) injection  125 mg Intravenous Q12H  . mometasone-formoterol  2 puff Inhalation BID  .  pantoprazole  20 mg Oral Daily  . tobramycin (PF)  300 mg Nebulization BID   Continuous Infusions: . sodium chloride 20 mL/hr at 04/07/17 1856  . ceFEPime (MAXIPIME) IV Stopped (04/08/17 0558)  . dextrose 5 % and 0.45 % NaCl with KCl 20 mEq/L 125 mL/hr at 04/08/17 0515     LOS: 0 days   Charlynne Cousins  [facility] Pager [phone]  *Please refer to [email or web address], password [email or web address] to get updated schedule on who will round on this patient, as hospitalists switch teams weekly. If 7PM-7AM, please contact night-coverage at [email or web address], password [email or web address] for any overnight needs.  04/08/2017, 8:50 AM

## 2017-04-08 NOTE — Discharge Summary (Signed)
Physician Discharge Summary  Jasmine [name] GUY:[id number] DOB: 20-Jan-1990 DOA: 04/07/2017  PCP: Patient, No Pcp Per  Admit date: 04/07/2017 Discharge date: 04/08/2017  Admitted From: home Disposition:  Home  Recommendations for Outpatient Follow-up:  1. Follow up with Pulmonary in 1-2 weeks    Home Health:NO Equipment/Devices:none  Discharge Condition:stable CODE STATUS:Full Diet recommendation: Heart Healthy    Brief/Interim Summary: 27 y.o. female past medical history significant for asthma, PE seizure comes into the hospital for shortness of breath that began 2 days prior to admission with productive cough    Discharge Diagnoses:  Principal Problem:   Cough Active Problems:   Asthma, chronic, unspecified asthma severity, with acute exacerbation   Seizure (HCC)   SOB (shortness of breath)  Started on IV steroids antibiotics by the next day she was much improved. As the patient kept saying that she has cystic fibrosis we would try to obtain records from her previous PCPs in [place] and we were unsuccessful 1 of them as she has never been seen by the PCP, from the other PCP which is Regular office visits there is no confirmation of cystic fibrosis. On this patient we were unable to find confirmation of her cystic fibrosis pulmonary and critical care was consulted who think she does not have cystic fibrosis either. She was discharged home on steroids and inhalers. Her PICC line was removed before she was DC'd, this was the PICC line she left from the hospital last month.  Discharge Instructions  Discharge Instructions    Diet - low sodium heart healthy   Complete by:  As directed    Diet - low sodium heart healthy   Complete by:  As directed    Increase activity slowly   Complete by:  As directed    Increase activity slowly   Complete by:  As directed      Allergies as of 04/08/2017      Reactions   Cheese Shortness Of Breath   Chocolate Shortness Of Breath    Ibuprofen Hives, Shortness Of Breath   Children's ibuprofen   Ivp Dye [iodinated Diagnostic Agents] Shortness Of Breath   Latex Anaphylaxis, Swelling, Other (See Comments)   Reaction:  Localized swelling    Orange Juice [orange Oil] Shortness Of Breath   Other Hives, Other (See Comments)   Pt states that she is allergic to all steroids except IV solu-medrol.     Peach [prunus Persica] Anaphylaxis   Peanuts [peanut Oil] Anaphylaxis   Pear Anaphylaxis   Prednisone Hives   Raspberry Anaphylaxis   Tylenol [acetaminophen] Hives, Shortness Of Breath, Other (See Comments)   Pt states that this is only with the liquid form.     Amoxicillin Hives, Other (See Comments)   Has patient had a PCN reaction causing immediate rash, facial/tongue/throat swelling, SOB or lightheadedness with hypotension: No Has patient had a PCN reaction causing severe rash involving mucus membranes or skin necrosis: No Has patient had a PCN reaction that required hospitalization: No Has patient had a PCN reaction occurring within the last 10 years: No If all of the above answers are "NO", then may proceed with Cephalosporin use.   Apricot Flavor Hives   Doxycycline Hives   Erythromycin Hives   Milk Of Magnesia [magnesium Hydroxide] Hives, Itching   Penicillins Hives, Other (See Comments)   Has patient had a PCN reaction causing immediate rash, facial/tongue/throat swelling, SOB or lightheadedness with hypotension: No Has patient had a PCN reaction causing severe rash involving mucus  membranes or skin necrosis: No Has patient had a PCN reaction that required hospitalization: No Has patient had a PCN reaction occurring within the last 10 years: No If all of the above answers are "NO", then may proceed with Cephalosporin use.      Medication List    STOP taking these medications   ipratropium-albuterol 0.5-2.5 (3) MG/3ML Soln Commonly known as:  DUONEB   OVER THE COUNTER MEDICATION   POTASSIUM CHLORIDE ER PO    promethazine 12.5 MG tablet Commonly known as:  PHENERGAN     TAKE these medications   acetaminophen 325 MG tablet Commonly known as:  TYLENOL Take 325 mg by mouth every 6 (six) hours as needed for moderate pain.   albuterol 108 (90 Base) MCG/ACT inhaler Commonly known as:  PROVENTIL HFA;VENTOLIN HFA Inhale 2 puffs into the lungs every 4 (four) hours as needed for wheezing or shortness of breath.   fluticasone-salmeterol 115-21 MCG/ACT inhaler Commonly known as:  ADVAIR HFA Inhale 2 puffs into the lungs 2 (two) times daily.   levETIRAcetam 500 MG tablet Commonly known as:  KEPPRA Take 1 tablet (500 mg total) by mouth 2 (two) times daily.   naproxen 375 MG tablet Commonly known as:  NAPROSYN Take 1 tablet (375 mg total) by mouth 2 (two) times daily.   NEXPLANON 68 MG Impl implant Generic drug:  etonogestrel 1 each once by Subdermal route.   oxyCODONE 5 MG immediate release tablet Commonly known as:  ROXICODONE Take 1 tablet (5 mg total) by mouth every 4 (four) hours as needed for severe pain.   pantoprazole 20 MG tablet Commonly known as:  PROTONIX Take 1 tablet (20 mg total) by mouth daily.   predniSONE 10 MG tablet Commonly known as:  DELTASONE Takes 6 tablets for 1 days, then 5 tablets for 1 days, then 4 tablets for 1 days, then 3 tablets for 1 days, then 2 tabs for 1 days, then 1 tab for 1 days, and then stop. What changed:    medication strength  how much to take  how to take this  when to take this  additional instructions       Allergies  Allergen Reactions  . Cheese Shortness Of Breath  . Chocolate Shortness Of Breath  . Ibuprofen Hives and Shortness Of Breath    Children's ibuprofen  . Ivp Dye [Iodinated Diagnostic Agents] Shortness Of Breath  . Latex Anaphylaxis, Swelling and Other (See Comments)    Reaction:  Localized swelling   . Orange Juice [Orange Oil] Shortness Of Breath  . Other Hives and Other (See Comments)    Pt states that she  is allergic to all steroids except IV solu-medrol.    Marland Kitchen Peach [Prunus Persica] Anaphylaxis  . Peanuts [Peanut Oil] Anaphylaxis  . Pear Anaphylaxis  . Prednisone Hives  . Raspberry Anaphylaxis  . Tylenol [Acetaminophen] Hives, Shortness Of Breath and Other (See Comments)    Pt states that this is only with the liquid form.    . Amoxicillin Hives and Other (See Comments)    Has patient had a PCN reaction causing immediate rash, facial/tongue/throat swelling, SOB or lightheadedness with hypotension: No Has patient had a PCN reaction causing severe rash involving mucus membranes or skin necrosis: No Has patient had a PCN reaction that required hospitalization: No Has patient had a PCN reaction occurring within the last 10 years: No If all of the above answers are "NO", then may proceed with Cephalosporin use.  Marland Kitchen Magazine features editor  Hives  . Doxycycline Hives  . Erythromycin Hives  . Milk Of Magnesia [Magnesium Hydroxide] Hives and Itching  . Penicillins Hives and Other (See Comments)    Has patient had a PCN reaction causing immediate rash, facial/tongue/throat swelling, SOB or lightheadedness with hypotension: No Has patient had a PCN reaction causing severe rash involving mucus membranes or skin necrosis: No Has patient had a PCN reaction that required hospitalization: No Has patient had a PCN reaction occurring within the last 10 years: No If all of the above answers are "NO", then may proceed with Cephalosporin use.    Consultations:  Pulmonary and critical care.   Procedures/Studies: Dg Chest 2 View  Result Date: 04/07/2017 CLINICAL DATA:  Cystic fibrosis. EXAM: CHEST - 2 VIEW COMPARISON:  03/14/2017 FINDINGS: The lungs are clear without focal pneumonia, edema, pneumothorax or pleural effusion. The cardiopericardial silhouette is within normal limits for size. Right PICC line tip overlies the distal SVC near the junction with the RA. The visualized bony structures of the thorax are  intact. IMPRESSION: No active cardiopulmonary disease. Electronically Signed   By: Misty Stanley M.D.   On: 04/07/2017 19:27   Dg Chest 2 View  Result Date: 03/14/2017 CLINICAL DATA:  27 year old female with cough and congestion. EXAM: CHEST  2 VIEW COMPARISON:  Chest radiograph dated 03/11/2017 FINDINGS: Right-sided PICC with tip at the cavoatrial junction. The lungs are clear. There is no pleural effusion or pneumothorax. The cardiac silhouette is within normal limits. No acute osseous pathology. IMPRESSION: No active cardiopulmonary disease. Electronically Signed   By: Anner Crete M.D.   On: 03/14/2017 03:43   Dg Chest 2 View  Result Date: 03/10/2017 CLINICAL DATA:  Cystic fibrosis. Throat pain, chest pain, short of breath. EXAM: CHEST  2 VIEW COMPARISON:  02/14/2017 FINDINGS: Normal heart size. Lungs clear. No pneumothorax. No pleural effusion. IMPRESSION: No active cardiopulmonary disease. Electronically Signed   By: Marybelle Killings M.D.   On: 03/10/2017 13:03   Dg Chest Port 1 View  Result Date: 03/11/2017 CLINICAL DATA:  PICC line placement, cystic fibrosis EXAM: PORTABLE CHEST 1 VIEW COMPARISON:  Portable exam 1235 hours compared to 03/10/2017 FINDINGS: RIGHT arm PICC line tip projects over SVC near cavoatrial junction. Normal heart size, mediastinal contours, and pulmonary vascularity. Lungs clear. No pleural effusion or pneumothorax. Bones unremarkable. IMPRESSION: Tip of RIGHT arm PICC line projects over SVC. No acute abnormalities. Electronically Signed   By: Lavonia Dana M.D.   On: 03/11/2017 13:01   (Echo, Carotid, EGD, Colonoscopy, ERCP)    Subjective:   Discharge Exam: Vitals:   04/08/17 0514 04/08/17 0750  BP: 100/83   Pulse: 87   Resp: 16   Temp: 98.2 F (36.8 C)   SpO2: 100% 98%   Vitals:   04/07/17 2202 04/08/17 0001 04/08/17 0514 04/08/17 0750  BP: (!) 120/93  100/83   Pulse: 91  87   Resp:   16   Temp: 98.5 F (36.9 C)  98.2 F (36.8 C)   TempSrc: Oral   Oral   SpO2: 100% 99% 100% 98%  Weight: 75.9 kg (167 lb 4.8 oz)     Height: 5\' 4"  (1.626 m)       General: Pt is alert, awake, not in acute distress Cardiovascular: RRR, S1/S2 +, no rubs, no gallops Respiratory: CTA bilaterally, no wheezing, no rhonchi Abdominal: Soft, NT, ND, bowel sounds + Extremities: no edema, no cyanosis    The results of significant diagnostics from this hospitalization (including  imaging, microbiology, ancillary and laboratory) are listed below for reference.     Microbiology: Recent Results (from the past 240 hour(s))  Respiratory Panel by PCR     Status: None   Collection Time: 04/07/17 10:10 PM  Result Value Ref Range Status   Adenovirus NOT DETECTED NOT DETECTED Final   Coronavirus 229E NOT DETECTED NOT DETECTED Final   Coronavirus HKU1 NOT DETECTED NOT DETECTED Final   Coronavirus NL63 NOT DETECTED NOT DETECTED Final   Coronavirus OC43 NOT DETECTED NOT DETECTED Final   Metapneumovirus NOT DETECTED NOT DETECTED Final   Rhinovirus / Enterovirus NOT DETECTED NOT DETECTED Final   Influenza A NOT DETECTED NOT DETECTED Final   Influenza B NOT DETECTED NOT DETECTED Final   Parainfluenza Virus 1 NOT DETECTED NOT DETECTED Final   Parainfluenza Virus 2 NOT DETECTED NOT DETECTED Final   Parainfluenza Virus 3 NOT DETECTED NOT DETECTED Final   Parainfluenza Virus 4 NOT DETECTED NOT DETECTED Final   Respiratory Syncytial Virus NOT DETECTED NOT DETECTED Final   Bordetella pertussis NOT DETECTED NOT DETECTED Final   Chlamydophila pneumoniae NOT DETECTED NOT DETECTED Final   Mycoplasma pneumoniae NOT DETECTED NOT DETECTED Final    Comment: Performed at [facility], [place]. [address], [place], [place] [address]     Labs: BNP (last 3 results) No results for input(s): BNP in the last 8760 hours. Basic Metabolic Panel: Recent Labs  Lab 04/07/17 1903  NA 139  K 4.2  CL 108  CO2 22  GLUCOSE 91  BUN 7  CREATININE 0.61  CALCIUM 9.5   Liver Function  Tests: Recent Labs  Lab 04/07/17 1903  AST 23  ALT 16  ALKPHOS 53  BILITOT 0.2*  PROT 7.6  ALBUMIN 4.4   No results for input(s): LIPASE, AMYLASE in the last 168 hours. No results for input(s): AMMONIA in the last 168 hours. CBC: Recent Labs  Lab 04/07/17 1903  WBC 14.0*  NEUTROABS 11.2*  HGB 12.5  HCT 38.4  MCV 93.2  PLT 305   Cardiac Enzymes: No results for input(s): CKTOTAL, CKMB, CKMBINDEX, TROPONINI in the last 168 hours. BNP: Invalid input(s): POCBNP CBG: No results for input(s): GLUCAP in the last 168 hours. D-Dimer No results for input(s): DDIMER in the last 72 hours. Hgb A1c No results for input(s): HGBA1C in the last 72 hours. Lipid Profile No results for input(s): CHOL, HDL, LDLCALC, TRIG, CHOLHDL, LDLDIRECT in the last 72 hours. Thyroid function studies No results for input(s): TSH, T4TOTAL, T3FREE, THYROIDAB in the last 72 hours.  Invalid input(s): FREET3 Anemia work up No results for input(s): VITAMINB12, FOLATE, FERRITIN, TIBC, IRON, RETICCTPCT in the last 72 hours. Urinalysis    Component Value Date/Time   COLORURINE YELLOW 03/10/2017 1423   APPEARANCEUR CLEAR 03/10/2017 1423   LABSPEC 1.036 (H) 03/10/2017 1423   PHURINE 5.0 03/10/2017 1423   GLUCOSEU NEGATIVE 03/10/2017 1423   HGBUR NEGATIVE 03/10/2017 [place] 03/10/2017 1423   KETONESUR NEGATIVE 03/10/2017 1423   PROTEINUR NEGATIVE 03/10/2017 1423   UROBILINOGEN 1.0 11/04/2014 0946   NITRITE NEGATIVE 03/10/2017 1423   LEUKOCYTESUR NEGATIVE 03/10/2017 1423   Sepsis Labs Invalid input(s): PROCALCITONIN,  WBC,  LACTICIDVEN Microbiology Recent Results (from the past 240 hour(s))  Respiratory Panel by PCR     Status: None   Collection Time: 04/07/17 10:10 PM  Result Value Ref Range Status   Adenovirus NOT DETECTED NOT DETECTED Final   Coronavirus 229E NOT DETECTED NOT DETECTED Final  Coronavirus HKU1 NOT DETECTED NOT DETECTED Final   Coronavirus NL63 NOT DETECTED  NOT DETECTED Final   Coronavirus OC43 NOT DETECTED NOT DETECTED Final   Metapneumovirus NOT DETECTED NOT DETECTED Final   Rhinovirus / Enterovirus NOT DETECTED NOT DETECTED Final   Influenza A NOT DETECTED NOT DETECTED Final   Influenza B NOT DETECTED NOT DETECTED Final   Parainfluenza Virus 1 NOT DETECTED NOT DETECTED Final   Parainfluenza Virus 2 NOT DETECTED NOT DETECTED Final   Parainfluenza Virus 3 NOT DETECTED NOT DETECTED Final   Parainfluenza Virus 4 NOT DETECTED NOT DETECTED Final   Respiratory Syncytial Virus NOT DETECTED NOT DETECTED Final   Bordetella pertussis NOT DETECTED NOT DETECTED Final   Chlamydophila pneumoniae NOT DETECTED NOT DETECTED Final   Mycoplasma pneumoniae NOT DETECTED NOT DETECTED Final    Comment: Performed at [facility], [place] [address] [place], [place] [address]     Time coordinating discharge: Over 30 minutes  SIGNED:   Charlynne Cousins, MD  [facility] 04/08/2017, 2:34 PM Pager   If 7PM-7AM, please contact night-coverage [email or web address] Password [email or web address]

## 2017-05-04 DIAGNOSIS — R9431 Abnormal electrocardiogram [ECG] [EKG]: Secondary | ICD-10-CM | POA: Diagnosis not present

## 2017-05-04 DIAGNOSIS — J189 Pneumonia, unspecified organism: Secondary | ICD-10-CM | POA: Insufficient documentation

## 2017-05-04 DIAGNOSIS — R0602 Shortness of breath: Secondary | ICD-10-CM | POA: Diagnosis not present

## 2017-05-16 ENCOUNTER — Inpatient Hospital Stay: Payer: Self-pay | Admitting: Emergency Medicine

## 2017-05-29 ENCOUNTER — Emergency Department (HOSPITAL_COMMUNITY): Payer: Medicaid Other

## 2017-05-29 ENCOUNTER — Observation Stay (HOSPITAL_COMMUNITY)
Admission: EM | Admit: 2017-05-29 | Discharge: 2017-05-29 | Discharge status: Against Medical Advice | Payer: Medicaid Other | Attending: Internal Medicine | Admitting: Internal Medicine

## 2017-05-29 ENCOUNTER — Encounter (HOSPITAL_COMMUNITY): Payer: Self-pay | Admitting: Emergency Medicine

## 2017-05-29 ENCOUNTER — Other Ambulatory Visit: Payer: Self-pay

## 2017-05-29 DIAGNOSIS — Z8249 Family history of ischemic heart disease and other diseases of the circulatory system: Secondary | ICD-10-CM | POA: Insufficient documentation

## 2017-05-29 DIAGNOSIS — J181 Lobar pneumonia, unspecified organism: Secondary | ICD-10-CM

## 2017-05-29 DIAGNOSIS — Z91041 Radiographic dye allergy status: Secondary | ICD-10-CM | POA: Diagnosis not present

## 2017-05-29 DIAGNOSIS — G40909 Epilepsy, unspecified, not intractable, without status epilepticus: Secondary | ICD-10-CM | POA: Insufficient documentation

## 2017-05-29 DIAGNOSIS — J9621 Acute and chronic respiratory failure with hypoxia: Principal | ICD-10-CM | POA: Diagnosis present

## 2017-05-29 DIAGNOSIS — Z886 Allergy status to analgesic agent status: Secondary | ICD-10-CM | POA: Diagnosis not present

## 2017-05-29 DIAGNOSIS — G4733 Obstructive sleep apnea (adult) (pediatric): Secondary | ICD-10-CM | POA: Insufficient documentation

## 2017-05-29 DIAGNOSIS — Z9119 Patient's noncompliance with other medical treatment and regimen: Secondary | ICD-10-CM | POA: Diagnosis not present

## 2017-05-29 DIAGNOSIS — Z79899 Other long term (current) drug therapy: Secondary | ICD-10-CM | POA: Insufficient documentation

## 2017-05-29 DIAGNOSIS — K861 Other chronic pancreatitis: Secondary | ICD-10-CM | POA: Diagnosis present

## 2017-05-29 DIAGNOSIS — Z86718 Personal history of other venous thrombosis and embolism: Secondary | ICD-10-CM | POA: Insufficient documentation

## 2017-05-29 DIAGNOSIS — Z888 Allergy status to other drugs, medicaments and biological substances status: Secondary | ICD-10-CM | POA: Diagnosis not present

## 2017-05-29 DIAGNOSIS — R079 Chest pain, unspecified: Secondary | ICD-10-CM | POA: Diagnosis not present

## 2017-05-29 DIAGNOSIS — J45901 Unspecified asthma with (acute) exacerbation: Secondary | ICD-10-CM | POA: Diagnosis present

## 2017-05-29 DIAGNOSIS — Z832 Family history of diseases of the blood and blood-forming organs and certain disorders involving the immune mechanism: Secondary | ICD-10-CM | POA: Diagnosis not present

## 2017-05-29 DIAGNOSIS — R0600 Dyspnea, unspecified: Secondary | ICD-10-CM

## 2017-05-29 DIAGNOSIS — R569 Unspecified convulsions: Secondary | ICD-10-CM

## 2017-05-29 DIAGNOSIS — Z720 Tobacco use: Secondary | ICD-10-CM | POA: Diagnosis present

## 2017-05-29 DIAGNOSIS — D573 Sickle-cell trait: Secondary | ICD-10-CM | POA: Diagnosis not present

## 2017-05-29 DIAGNOSIS — Z881 Allergy status to other antibiotic agents status: Secondary | ICD-10-CM | POA: Diagnosis not present

## 2017-05-29 DIAGNOSIS — Z880 Allergy status to penicillin: Secondary | ICD-10-CM | POA: Diagnosis not present

## 2017-05-29 DIAGNOSIS — R0602 Shortness of breath: Secondary | ICD-10-CM

## 2017-05-29 DIAGNOSIS — J189 Pneumonia, unspecified organism: Secondary | ICD-10-CM | POA: Diagnosis not present

## 2017-05-29 DIAGNOSIS — R000 Tachycardia, unspecified: Secondary | ICD-10-CM | POA: Diagnosis not present

## 2017-05-29 DIAGNOSIS — Z9981 Dependence on supplemental oxygen: Secondary | ICD-10-CM | POA: Diagnosis not present

## 2017-05-29 DIAGNOSIS — Z87891 Personal history of nicotine dependence: Secondary | ICD-10-CM | POA: Diagnosis not present

## 2017-05-29 DIAGNOSIS — O2230 Deep phlebothrombosis in pregnancy, unspecified trimester: Secondary | ICD-10-CM | POA: Diagnosis present

## 2017-05-29 LAB — I-STAT TROPONIN, ED: TROPONIN I, POC: 0 ng/mL (ref 0.00–0.08)

## 2017-05-29 LAB — D-DIMER, QUANTITATIVE: D-Dimer, Quant: 0.36 ug/mL-FEU (ref 0.00–0.50)

## 2017-05-29 LAB — BASIC METABOLIC PANEL
ANION GAP: 12 (ref 5–15)
BUN: 6 mg/dL (ref 6–20)
CALCIUM: 9.2 mg/dL (ref 8.9–10.3)
CO2: 20 mmol/L — AB (ref 22–32)
CREATININE: 0.86 mg/dL (ref 0.44–1.00)
Chloride: 105 mmol/L (ref 101–111)
Glucose, Bld: 90 mg/dL (ref 65–99)
Potassium: 3.5 mmol/L (ref 3.5–5.1)
SODIUM: 137 mmol/L (ref 135–145)

## 2017-05-29 LAB — I-STAT ARTERIAL BLOOD GAS, ED
ACID-BASE DEFICIT: 4 mmol/L — AB (ref 0.0–2.0)
Bicarbonate: 21.2 mmol/L (ref 20.0–28.0)
O2 SAT: 100 %
PH ART: 7.366 (ref 7.350–7.450)
Patient temperature: 98.6
TCO2: 22 mmol/L (ref 22–32)
pCO2 arterial: 37 mmHg (ref 32.0–48.0)
pO2, Arterial: 196 mmHg — ABNORMAL HIGH (ref 83.0–108.0)

## 2017-05-29 LAB — DIFFERENTIAL
BASOS PCT: 1 %
Basophils Absolute: 0.1 10*3/uL (ref 0.0–0.1)
EOS ABS: 0.4 10*3/uL (ref 0.0–0.7)
Eosinophils Relative: 4 %
LYMPHS PCT: 39 %
Lymphs Abs: 4.1 10*3/uL — ABNORMAL HIGH (ref 0.7–4.0)
MONO ABS: 0.5 10*3/uL (ref 0.1–1.0)
Monocytes Relative: 5 %
NEUTROS ABS: 5.5 10*3/uL (ref 1.7–7.7)
Neutrophils Relative %: 51 %

## 2017-05-29 LAB — CBC
HCT: 39.7 % (ref 36.0–46.0)
Hemoglobin: 12.7 g/dL (ref 12.0–15.0)
MCH: 29.5 pg (ref 26.0–34.0)
MCHC: 32 g/dL (ref 30.0–36.0)
MCV: 92.1 fL (ref 78.0–100.0)
PLATELETS: 246 10*3/uL (ref 150–400)
RBC: 4.31 MIL/uL (ref 3.87–5.11)
RDW: 13 % (ref 11.5–15.5)
WBC: 10.6 10*3/uL — AB (ref 4.0–10.5)

## 2017-05-29 LAB — PROCALCITONIN: Procalcitonin: <0.1 ng/mL

## 2017-05-29 LAB — I-STAT BETA HCG BLOOD, ED (MC, WL, AP ONLY): I-stat hCG, quantitative: <5 m[IU]/mL (ref <5)

## 2017-05-29 LAB — I-STAT CG4 LACTIC ACID, ED: LACTIC ACID, VENOUS: 0.97 mmol/L (ref 0.5–1.9)

## 2017-05-29 MED ORDER — ALBUTEROL (5 MG/ML) CONTINUOUS INHALATION SOLN
15.0000 mg/h | INHALATION_SOLUTION | Freq: Once | RESPIRATORY_TRACT | Status: AC
  Administered 2017-05-29: 15 mg/h via RESPIRATORY_TRACT
  Filled 2017-05-29: qty 20

## 2017-05-29 MED ORDER — CIPROFLOXACIN IN D5W 400 MG/200ML IV SOLN
400.0000 mg | Freq: Once | INTRAVENOUS | Status: AC
  Administered 2017-05-29: 400 mg via INTRAVENOUS
  Filled 2017-05-29: qty 200

## 2017-05-29 MED ORDER — PANCRELIPASE (LIP-PROT-AMYL) 12000-38000 UNITS PO CPEP
24000.0000 [IU] | ORAL_CAPSULE | Freq: Three times a day (TID) | ORAL | Status: DC
  Filled 2017-05-29 (×2): qty 2

## 2017-05-29 MED ORDER — SODIUM CHLORIDE 0.9 % IV SOLN
INTRAVENOUS | Status: DC
  Administered 2017-05-29: 05:00:00 via INTRAVENOUS

## 2017-05-29 MED ORDER — IPRATROPIUM BROMIDE 0.02 % IN SOLN
1.0000 mg | Freq: Once | RESPIRATORY_TRACT | Status: AC
Start: 2017-05-29 — End: 2017-05-29
  Administered 2017-05-29: 1 mg via RESPIRATORY_TRACT
  Filled 2017-05-29: qty 5

## 2017-05-29 MED ORDER — LEVOFLOXACIN IN D5W 750 MG/150ML IV SOLN: 750.0000 mg | INTRAVENOUS | Status: DC

## 2017-05-29 MED ORDER — VANCOMYCIN HCL IN DEXTROSE 1-5 GM/200ML-% IV SOLN
1000.0000 mg | Freq: Once | INTRAVENOUS | Status: AC
Start: 2017-05-29 — End: 2017-05-29
  Administered 2017-05-29: 1000 mg via INTRAVENOUS
  Filled 2017-05-29: qty 200

## 2017-05-29 MED ORDER — DORNASE ALFA 2.5 MG/2.5ML IN SOLN
1.2500 mg | Freq: Every day | RESPIRATORY_TRACT | Status: DC
  Filled 2017-05-29: qty 2.5

## 2017-05-29 MED ORDER — ONDANSETRON HCL 4 MG/2ML IJ SOLN: 4.0000 mg | Freq: Four times a day (QID) | INTRAMUSCULAR | Status: DC | PRN

## 2017-05-29 MED ORDER — ONDANSETRON HCL 4 MG PO TABS
4.0000 mg | ORAL_TABLET | Freq: Four times a day (QID) | ORAL | Status: DC | PRN
Start: 2017-05-29 — End: 2017-05-29

## 2017-05-29 MED ORDER — ALBUTEROL SULFATE (2.5 MG/3ML) 0.083% IN NEBU: 2.5000 mg | INHALATION_SOLUTION | RESPIRATORY_TRACT | Status: DC | PRN

## 2017-05-29 MED ORDER — ACETAMINOPHEN 650 MG RE SUPP: 650.0000 mg | Freq: Four times a day (QID) | RECTAL | Status: DC | PRN

## 2017-05-29 MED ORDER — CEFTAZIDIME 2 G IJ SOLR
2.0000 g | Freq: Once | INTRAMUSCULAR | Status: AC
  Administered 2017-05-29: 2 g via INTRAVENOUS
  Filled 2017-05-29: qty 2

## 2017-05-29 MED ORDER — VITAMIN C 500 MG PO TABS: 1000.0000 mg | ORAL_TABLET | Freq: Every day | ORAL | Status: DC

## 2017-05-29 MED ORDER — FENTANYL CITRATE (PF) 100 MCG/2ML IJ SOLN
50.0000 ug | Freq: Once | INTRAMUSCULAR | Status: AC
  Administered 2017-05-29: 50 ug via INTRAVENOUS
  Filled 2017-05-29: qty 2

## 2017-05-29 MED ORDER — ADULT MULTIVITAMIN W/MINERALS CH: 1.0000 | ORAL_TABLET | Freq: Every day | ORAL | Status: DC

## 2017-05-29 MED ORDER — ACETAMINOPHEN 325 MG PO TABS
650.0000 mg | ORAL_TABLET | Freq: Four times a day (QID) | ORAL | Status: DC | PRN
  Administered 2017-05-29: 650 mg via ORAL
  Filled 2017-05-29: qty 2

## 2017-05-29 MED ORDER — ENOXAPARIN SODIUM 40 MG/0.4ML ~~LOC~~ SOLN
40.0000 mg | Freq: Every day | SUBCUTANEOUS | Status: DC
  Filled 2017-05-29: qty 0.4

## 2017-05-29 MED ORDER — METHYLPREDNISOLONE SODIUM SUCC 125 MG IJ SOLR
125.0000 mg | Freq: Once | INTRAMUSCULAR | Status: AC
  Administered 2017-05-29: 125 mg via INTRAVENOUS
  Filled 2017-05-29: qty 2

## 2017-05-29 MED ORDER — MONTELUKAST SODIUM 10 MG PO TABS: 10.0000 mg | ORAL_TABLET | Freq: Every day | ORAL | Status: DC

## 2017-05-29 MED ORDER — METHOCARBAMOL 1000 MG/10ML IJ SOLN
500.0000 mg | Freq: Four times a day (QID) | INTRAMUSCULAR | Status: DC | PRN
Start: 2017-05-29 — End: 2017-05-29

## 2017-05-29 MED ORDER — DOCUSATE SODIUM 100 MG PO CAPS: 200.0000 mg | ORAL_CAPSULE | Freq: Every day | ORAL | Status: DC

## 2017-05-29 MED ORDER — METHYLPREDNISOLONE SODIUM SUCC 125 MG IJ SOLR
60.0000 mg | Freq: Two times a day (BID) | INTRAMUSCULAR | Status: DC
  Administered 2017-05-29: 60 mg via INTRAVENOUS
  Filled 2017-05-29: qty 2

## 2017-05-29 MED ORDER — IPRATROPIUM-ALBUTEROL 0.5-2.5 (3) MG/3ML IN SOLN
3.0000 mL | Freq: Four times a day (QID) | RESPIRATORY_TRACT | Status: DC
  Administered 2017-05-29: 3 mL via RESPIRATORY_TRACT
  Filled 2017-05-29: qty 3

## 2017-05-29 NOTE — H&P (Signed)
History and Physical    Jasmine [name] ASN:[id number] DOB: 15-Apr-1990 DOA: 05/29/2017  **Will place patient in observation status based on the expectation that the patient will need hospitalization/ hospital care less than or equal to 24 hours  PCP: Patient, No Pcp Per   Attending physician: Lorin Mercy  Patient coming from/Resides with: Private residence/husband and children  Chief Complaint: Shortness of breath, cough and chills  HPI: Jasmine [name] is a 27 y.o. female with medical history significant for severe asthma, on chronic home O2, history of DVT during pregnancy, seizure disorder currently not on AEDs, former tobacco abuse, chronic pancreatitis, sickle cell trait and sleep apnea nocturnal CPAP.  Reports 2 days of increasing pleuritic chest discomfort and shortness of breath with nonproductive cough and chills.  Has been using home nebulizers and MDIs without improvement in symptoms.  Based on oxygen requirement 2 L now up to 4 L.  Upon arrival to the ER patient was quite tachypneic and was diffusely wheezing although it is noted that with distraction respiratory rate decreased markedly.  Chest x-ray here was unremarkable for a question of left basilar opacity possibly reflective of mild pneumonia.  Patient reports during Easter she was evaluated in [place] and was treated for pneumonia.  Because of reports of cystic fibrosis patient has been given broad-spectrum empiric antibiotics noting she was recently discharged from this facility on 2/25 after being treated for severe asthma exacerbation.  Of note patient has a previous admission where a PICC line was placed due to poor IV access and she left AMA without allowing the PICC line to be removed.  I did discuss with the patient why she has left AMA she reports she has childcare issues.  I also counseled patient regarding need to establish with PCP noting her multiple medical problems and repeated ER visits.  ED Course:  Vital Signs: BP  118/68 (BP Location: Right Arm)   Pulse 94   Temp 98.3 F (36.8 C) (Oral)   Resp (!) 26   Ht 5\' 4"  (1.626 m)   Wt 68 kg (150 lb)   LMP 05/15/2017   SpO2 100%   BMI 25.75 kg/m  Chest x-ray: As above Lab data: Sodium 137, potassium 3.5, chloride 105, CO2 20, Leukos 90, BUN 6, creatinine 0.86, troponin normal, lactic acid normal, white count 10,600 with normal differential, hemoglobin 12.7, platelets 246,000 0.36, blood cultures obtained in the ER prior to administration of antibiotics, i-STAT hCG negative Medications and treatments: Continuous albuterol neb 15 mg/hr, Atrovent neb 1 mg x 1, Solu-Medrol 125 mg IV x1, fentanyl 50 mcg IV x1, vancomycin 1 g IV x1, Fortaz 2 g IV x1, Cipro 400 mg IV x1  Review of Systems:  In addition to the HPI above,  No Fever, myalgias or other constitutional symptoms No Headache, changes with Vision or hearing, new weakness, tingling, numbness in any extremity, dizziness, dysarthria or word finding difficulty, gait disturbance or imbalance, tremors or seizure activity No problems swallowing food or Liquids, indigestion/reflux, choking or coughing while eating, abdominal pain with or after eating No palpitations, orthopnea or DOE No Abdominal pain, N/V, melena,hematochezia, dark tarry stools, constipation No dysuria, malodorous urine, hematuria or flank pain No new skin rashes, lesions, masses or bruises, No new joint pains, aches, swelling or redness No recent unintentional weight gain or loss No polyuria, polydypsia or polyphagia   Past Medical History:  Diagnosis Date  . Asthma   . Complication of anesthesia    "I wake up during anesthesia" (  10/14/2015)  . Cystic fibrosis ([place])   . 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 DVT (deep vein thrombosis) in pregnancy ([place])   . Epilepsy ([place])   . GERD (gastroesophageal reflux disease)   . Heart murmur    last work up age 36- no symtoms  . Pancreatitis   . Seizures ([place])    epilepsy  . Sickle cell  trait ([place])   . Sleep apnea    "used to wear CPAP; don't have one here in [place] since I moved in 2014" (10/14/2015)    Past Surgical History:  Procedure Laterality Date  . APPENDECTOMY    . CESAREAN SECTION  2013  . CESAREAN SECTION N/A 11/08/2014   Procedure: CESAREAN SECTION;  Surgeon: Truett Mainland, DO;  Location: [facility];  Service: Obstetrics;  Laterality: N/A;  . CHOLECYSTECTOMY N/A 10/16/2015   Procedure: LAPAROSCOPIC CHOLECYSTECTOMY WITH POSSIBLE INTRAOPERATIVE CHOLANGIOGRAM;  Surgeon: Donnie Mesa, MD;  Location: [facility];  Service: General;  Laterality: N/A;  . ESOPHAGOGASTRODUODENOSCOPY (EGD) WITH PROPOFOL N/A 11/15/2016   Procedure: ESOPHAGOGASTRODUODENOSCOPY (EGD) WITH PROPOFOL;  Surgeon: Clarene Essex, MD;  Location: [facility];  Service: Endoscopy;  Laterality: N/A;  . INGUINAL HERNIA REPAIR Bilateral ~ 1996  . NERVE, TENDON AND ARTERY REPAIR Right 09/23/2012   Procedure: I&D and Repair As Necessary/Right Hand and Palm;  Surgeon: Roseanne Kaufman, MD;  Location: [facility];  Service: Orthopedics;  Laterality: Right;  . TONSILLECTOMY      Social History   Socioeconomic History  . Marital status: Single    Spouse name: Not on file  . Number of children: Not on file  . Years of education: Not on file  . Highest education level: Not on file  Occupational History  . Not on file  Social Needs  . Financial resource strain: Not on file  . Food insecurity:    Worry: Not on file    Inability: Not on file  . Transportation needs:    Medical: Not on file    Non-medical: Not on file  Tobacco Use  . Smoking status: Former Smoker    Packs/day: 0.25    Years: 4.00    Pack years: 1.00    Types: Cigarettes    Last attempt to quit: 10/02/2015    Years since quitting: 1.6  . Smokeless tobacco: Never Used  Substance and Sexual Activity  . Alcohol use: No    Alcohol/week: 0.0 oz    Frequency: Never  . Drug use: No  . Sexual activity: Yes    Birth control/protection: None  Lifestyle  .  Physical activity:    Days per week: Not on file    Minutes per session: Not on file  . Stress: Not on file  Relationships  . Social connections:    Talks on phone: Not on file    Gets together: Not on file    Attends religious service: Not on file    Active member of club or organization: Not on file    Attends meetings of clubs or organizations: Not on file    Relationship status: Not on file  . Intimate partner violence:    Fear of current or ex partner: Not on file    Emotionally abused: Not on file    Physically abused: Not on file    Forced sexual activity: Not on file  Other Topics Concern  . Not on file  Social History Narrative  . Not on file    Mobility: Independent  Work history: ?  Disabled   Allergies  Allergen Reactions  . Cheese Shortness Of Breath  . Chocolate Shortness Of Breath  . Ibuprofen Hives and Shortness Of Breath    Children's ibuprofen  . Ivp Dye [Iodinated Diagnostic Agents] Shortness Of Breath  . Latex Anaphylaxis, Swelling and Other (See Comments)    Reaction:  Localized swelling   . Orange Juice [Orange Oil] Shortness Of Breath  . Other Hives and Other (See Comments)    Pt states that she is allergic to all steroids except IV solu-medrol.    Marland Kitchen Peach [Prunus Persica] Anaphylaxis  . Peanuts [Peanut Oil] Anaphylaxis  . Pear Anaphylaxis  . Prednisone Hives  . Raspberry Anaphylaxis  . Tylenol [Acetaminophen] Hives, Shortness Of Breath and Other (See Comments)    Pt states that this is only with the liquid form.    . Amoxicillin Hives and Other (See Comments)    Has patient had a PCN reaction causing immediate rash, facial/tongue/throat swelling, SOB or lightheadedness with hypotension: No Has patient had a PCN reaction causing severe rash involving mucus membranes or skin necrosis: No Has patient had a PCN reaction that required hospitalization: No Has patient had a PCN reaction occurring within the last 10 years: No If all of the above  answers are "NO", then may proceed with Cephalosporin use.  Marland Kitchen Apricot Flavor Hives  . Doxycycline Hives  . Erythromycin Hives  . Milk Of Magnesia [Magnesium Hydroxide] Hives and Itching  . Penicillins Hives and Other (See Comments)    Has patient had a PCN reaction causing immediate rash, facial/tongue/throat swelling, SOB or lightheadedness with hypotension: No Has patient had a PCN reaction causing severe rash involving mucus membranes or skin necrosis: No Has patient had a PCN reaction that required hospitalization: No Has patient had a PCN reaction occurring within the last 10 years: No If all of the above answers are "NO", then may proceed with Cephalosporin use.    Family History  Problem Relation Age of Onset  . Cancer Mother        cervical cancer  . Asthma Mother   . Hypertension Father   . Sickle cell anemia Father   . Asthma Sister   . Diabetes Maternal Aunt   . Cancer Maternal Grandmother      Prior to Admission medications   Medication Sig Start Date End Date Taking? Authorizing Provider  acetaminophen (TYLENOL) 325 MG tablet Take 325 mg by mouth every 6 (six) hours as needed for moderate pain.   Yes [provider]  albuterol (PROVENTIL HFA;VENTOLIN HFA) 108 (90 Base) MCG/ACT inhaler Inhale 2 puffs into the lungs every 4 (four) hours as needed for wheezing or shortness of breath. 01/06/17  Yes Robbie Lis, MD  Alum & Mag Hydroxide-Simeth (GI COCKTAIL) SUSP suspension Take 30 mLs by mouth 2 (two) times daily as needed for indigestion. Shake well.   Yes [provider]  Amylase-Lipase-Protease (CREON 20 PO) Take 2 tablets by mouth 3 (three) times daily before meals.   Yes [provider]  Ascorbic Acid (VITAMIN C PO) Take 1 tablet by mouth daily.   Yes [provider]  Docusate Calcium (STOOL SOFTENER PO) Take 1 tablet by mouth daily.   Yes [provider]  dornase alpha (PULMOZYME) 1 MG/ML nebulizer solution Take 1.25 mg  by nebulization daily.   Yes [provider]  etonogestrel (NEXPLANON) 68 MG IMPL implant 1 each once by Subdermal route.   Yes [provider]  fluticasone (FLONASE) 50 MCG/ACT nasal spray Place 1 spray into both nostrils daily.   Yes [provider]  Fluticasone-Salmeterol (ADVAIR) 500-50 MCG/DOSE AEPB Inhale 1 puff into the lungs 2 (two) times daily.   Yes [provider]  montelukast (SINGULAIR) 10 MG tablet Take 10 mg by mouth at bedtime.   Yes [provider]  Multiple Vitamin (MULTIVITAMIN WITH MINERALS) TABS tablet Take 2 tablets by mouth daily.   Yes [provider]  oxyCODONE (ROXICODONE) 5 MG immediate release tablet Take 1 tablet (5 mg total) by mouth every 4 (four) hours as needed for severe pain. 06/23/16  Yes Nona Dell, PA-C  PRESCRIPTION MEDICATION Inhale 1 puff into the lungs 3 (three) times daily. Aztreonam   Yes [provider]  PRESCRIPTION MEDICATION Inhale 1 Applicatorful into the lungs 2 (two) times daily. Lumicaftor + Lvacaftor   Yes [provider]    Physical Exam: Vitals:   05/29/17 0545 05/29/17 0600 05/29/17 0615 05/29/17 0702  BP: 122/74 130/66 (!) 125/93 118/68  Pulse: 80 88 91 94  Resp: (!) 41 (!) 35 (!) 31 (!) 26  Temp:      TempSrc:      SpO2: 100% 100% 100% 100%  Weight:      Height:          Constitutional: NAD, calm, uncomfortable 2/2 ongoing pleuritic chest discomfort Eyes: PERRL, lids and conjunctivae normal ENMT: Mucous membranes are dry. Posterior pharynx clear of any exudate or lesions.Normal dentition.  Neck: normal, supple, no masses, no thyromegaly Respiratory: Diffuse fine expiratory wheezes but with good air movement down to the bases. Normal respiratory effort without accessory muscle use rest.  4 L Cardiovascular: Regular rate and rhythm, no murmurs / rubs / gallops. No extremity edema. 2+ pedal pulses. No carotid bruits.  Abdomen: no tenderness, no  masses palpated. No hepatosplenomegaly. Bowel sounds positive.  Musculoskeletal: no clubbing / cyanosis. No joint deformity upper and lower extremities. Good ROM, no contractures. Normal muscle tone.  Skin: no rashes, lesions, ulcers. No induration Neurologic: CN 2-12 grossly intact. Sensation intact, DTR normal. Strength 5/5 x all 4 extremities.  Psychiatric: Normal judgment and insight. Alert and oriented x 3. Normal mood.    Labs on Admission: I have personally reviewed following labs and imaging studies  CBC: Recent Labs  Lab 05/29/17 0433  WBC 10.6*  NEUTROABS 5.5  HGB 12.7  HCT 39.7  MCV 92.1  PLT 035   Basic Metabolic Panel: Recent Labs  Lab 05/29/17 0433  NA 137  K 3.5  CL 105  CO2 20*  GLUCOSE 90  BUN 6  CREATININE 0.86  CALCIUM 9.2   GFR: Estimated Creatinine Clearance: 94.7 mL/min (by C-G formula based on SCr of 0.86 mg/dL). Liver Function Tests: No results for input(s): AST, ALT, ALKPHOS, BILITOT, PROT, ALBUMIN in the last 168 hours. No results for input(s): LIPASE, AMYLASE in the last 168 hours. No results for input(s): AMMONIA in the last 168 hours. Coagulation Profile: No results for input(s): INR, PROTIME in the last 168 hours. Cardiac Enzymes: No results for input(s): CKTOTAL, CKMB, CKMBINDEX, TROPONINI in the last 168 hours. BNP (last 3 results) No results for input(s): PROBNP in the last 8760 hours. HbA1C: No results for input(s): HGBA1C in the last 72 hours. CBG: No results for input(s): GLUCAP in the last 168 hours. Lipid Profile: No results for input(s): CHOL, HDL, LDLCALC, TRIG, CHOLHDL, LDLDIRECT in the last 72 hours. Thyroid Function Tests: No results for input(s): TSH,  T4TOTAL, FREET4, T3FREE, THYROIDAB in the last 72 hours. Anemia Panel: No results for input(s): VITAMINB12, FOLATE, FERRITIN, TIBC, IRON, RETICCTPCT in the last 72 hours. Urine analysis:    Component Value Date/Time   COLORURINE YELLOW 03/10/2017 1423   APPEARANCEUR  CLEAR 03/10/2017 1423   LABSPEC 1.036 (H) 03/10/2017 1423   PHURINE 5.0 03/10/2017 1423   GLUCOSEU NEGATIVE 03/10/2017 1423   HGBUR NEGATIVE 03/10/2017 [place] 03/10/2017 1423   KETONESUR NEGATIVE 03/10/2017 1423   PROTEINUR NEGATIVE 03/10/2017 1423   UROBILINOGEN 1.0 11/04/2014 0946   NITRITE NEGATIVE 03/10/2017 1423   LEUKOCYTESUR NEGATIVE 03/10/2017 1423   Sepsis Labs: @LABRCNTIP (procalcitonin:4,lacticidven:4) )No results found for this or any previous visit (from the past 240 hour(s)).   Radiological Exams on Admission: Dg Chest Portable 1 View  Result Date: 05/29/2017 CLINICAL DATA:  Acute onset of generalized chest pain and shortness of breath. Chills. Bloody sputum. EXAM: PORTABLE CHEST 1 VIEW COMPARISON:  Chest radiograph performed 04/07/2017 FINDINGS: The lungs are well-aerated. Minimal left basilar opacity could reflect mild pneumonia. There is no evidence of pleural effusion or pneumothorax. The cardiomediastinal silhouette is within normal limits. No acute osseous abnormalities are seen. A right PICC is noted ending about the distal SVC. IMPRESSION: Minimal left basilar opacity could reflect mild pneumonia. Electronically Signed   By: Garald Balding M.D.   On: 05/29/2017 04:52    EKG: (Independently reviewed) sinus tachycardia with ventricular rate 105 bpm, QTC 494 ms, normal R wave rotation, no acute ischemic changes  Assessment/Plan Principal Problem:     Acute on chronic respiratory failure with hypoxemia  /Acute asthma exacerbation -She presents with 2 days of nonproductive cough, chills and shortness of breath not responsive to home LABA and SABA as well as mucolytic neb -Possible early pneumonia on chest x-ray -Treat as asthmatic bronchitis-narrow antibiotics to Levaquin IV-PCN allergic -Scheduled duo nebs with albuterol every 2 hours as needed -Continue home Pulmozyme nebulizer -Solu-Medrol 60 mg IV every 12 hours -Continue home  Singulair -Obtain procalcitonin, influenza PCR, respiratory viral panel, urinary strep, urinary Legionella, sputum culture and HIV -Follow up on blood cultures obtained in ER -Of note patient has repeatedly informed providers that she has cystic fibrosis.  During previous admission in March it is documented that no clinical confirmation of cystic fibrosis determined.  Pulmonary medicine was consulted who felt the patient did not have underlying cystic fibrosis.  She has previously followed with Dr. Lamonte Sakai in the outpatient setting.  Patient does report though that she had a skin test (i.e. sweat test) several years ago at [facility] and was told that she had cystic fibrosis.  Patient needs to reestablish with local pulmonologist to continue to explore her pulmonary issues -Patient has had increase in baseline O2 requirement from 2 L to 4 L-wean back to baseline as tolerated  Active Problems:   Tobacco abuse -Patient reports she stopped smoking 2 years ago     Nonadherence to medical plan -Patient has history of leaving AMA and per her report this is related to childcare issues -She also has a history of not following up in the outpatient setting with providers -I have discussed with patient given her multiple medical problems it is important she has consistent medical follow-up -This management consulted to see if patient can establish at 1 of our local clinics-she does have Medicaid coverage     OSA -Continue nocturnal CPAP with oxygen bleed in    History of DVT (deep vein thrombosis)  in pregnancy  -D-dimer negative    Chronic pancreatitis  -Continue Creon -No history of alcohol abuse and does not have severe diabetes i.e. triglyceride dysfunction -Currently asymptomatic -?  Related to CFTR    Seizures  -Not on AEDs    Sickle cell trait     **Additional lab, imaging and/or diagnostic evaluation at discretion of supervising physician  DVT prophylaxis: Lovenox Code  Status: Full Family Communication: No family at bedside Disposition Plan: Home Consults called: None    ELLIS,ALLISON L. ANP-BC [facility] Pager [phone]   If 7PM-7AM, please contact night-coverage [email or web address] Password [email or web address]  05/29/2017, 8:07 AM

## 2017-05-29 NOTE — Care Management Note (Addendum)
Case Management Note  Patient Details  Name: Jasmine [name] MRN: [id number] Date of Birth: 1990-05-15  Subjective/Objective:                  27 year old female with history of cystic fibrosis on home oxygen, previous DVT in pregnancy who presents to the emergency department with increasing SOB. From home with spouse.  Action/Plan: Admit status OBS (SOB); anticipate discharge [place].   Expected Discharge Date:  (unknown)               Expected Discharge Plan:  Home/Self Care  In-House Referral:  PCP / [facility]  Discharge planning Services  CM Consult  Post Acute Care Choice:    Choice offered to:     DME Arranged:    DME Agency:     HH Arranged:    HH Agency:     Status of Service:  In process, will continue to follow  If discussed at Long Length of Stay Meetings, dates discussed:    Additional Comments: EDCM contacted to assist with follow-up appointment.  Pt has [employer] with an assigned PCP.  Pt must contact DSS to have PCP changed in order to be seen by another MD.  [name] J. Clydene Laming, RN, BSN, [place]   [name], RN 05/29/2017, 10:25 AM

## 2017-05-29 NOTE — ED Notes (Signed)
Got patient on the bedside toilet patient is resting with call bell in reach 

## 2017-05-29 NOTE — ED Notes (Signed)
Patient sitting on side of bed stating she wants to leave and does not want to take any unnecessary medicine. Patient is tachy and breathing fast. MD notified.

## 2017-05-29 NOTE — ED Triage Notes (Addendum)
Pt arrives with CP and SHOB x2 days. States she came to ED tonight for bloody sputum. Albuterol not effective. Reports chills, no documented fever. Reports that she usually wears 2L [place] O2, states she bumped it up to 4L yesterday. Ambulatory to department with [employer] in her large purse.

## 2017-05-29 NOTE — ED Notes (Signed)
Patient is resting comfortably.njo complaints after solumedrol given

## 2017-05-29 NOTE — ED Notes (Addendum)
Blood cultures collected , NS IV infusing , RT administered nebulizer treatment /collected ABG . Vancomycin IV antibiotic infusing.

## 2017-05-29 NOTE — ED Notes (Addendum)
Patient stated she wanted to leave. Explained risks to her and notified MD. Patient signed AMA form.

## 2017-05-29 NOTE — ED Notes (Signed)
Patient was shaking and complaining of back spasms on stretcher. Patient very animated in her movements.

## 2017-05-29 NOTE — ED Provider Notes (Signed)
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  Denies any fever but states she has had a cough with yellow blood-streaked sputum.  Reports that her chest and abdomen felt tight from working so hard to breathe.  No lower extremity swelling or pain.  States she feels breathing treatments, steroids will help her the most.  ROS: See HPI Constitutional: no fever  Eyes: no drainage  ENT: no runny nose   Cardiovascular:   chest pain  Resp:  SOB  GI: no vomiting GU: no dysuria Integumentary: no rash  Allergy: no hives  Musculoskeletal: no leg swelling  Neurological: no slurred speech ROS otherwise negative  PAST MEDICAL HISTORY/PAST SURGICAL HISTORY:  Past Medical History:  Diagnosis Date  . Asthma   . Complication of anesthesia    "I wake up during anesthesia" (10/14/2015)  . Cystic fibrosis ([place])   . DVT (deep vein thrombosis) in pregnancy ([place])   . Epilepsy ([place])   . GERD (gastroesophageal reflux disease)   . Heart murmur    last work up age 28- no symtoms  . Pancreatitis   . Seizures ([place])    epilepsy  . Sickle cell trait ([place])   . Sleep apnea    "used to wear CPAP; don't have one here in [place] since I moved in 2014" (10/14/2015)    MEDICATIONS:  Prior to Admission medications   Medication Sig Start Date End Date Taking? Authorizing Provider  acetaminophen (TYLENOL) 325 MG tablet Take 325 mg by mouth every 6 (six) hours as needed for moderate pain.    [provider]  albuterol (PROVENTIL HFA;VENTOLIN HFA) 108 (90 Base) MCG/ACT inhaler Inhale 2 puffs into the lungs every 4 (four) hours as needed for wheezing or shortness of breath. 01/06/17   Robbie Lis, MD  etonogestrel (NEXPLANON) 68 MG IMPL implant 1 each once  by Subdermal route.    [provider]  fluticasone-salmeterol (ADVAIR HFA) 115-21 MCG/ACT inhaler Inhale 2 puffs into the lungs 2 (two) times daily. 04/08/17   Charlynne Cousins, MD  levETIRAcetam (KEPPRA) 500 MG tablet Take 1 tablet (500 mg total) by mouth 2 (two) times daily. 01/06/17   Robbie Lis, MD  naproxen (NAPROSYN) 375 MG tablet Take 1 tablet (375 mg total) by mouth 2 (two) times daily. Patient not taking: Reported on 04/07/2017 02/26/17   Charlann Lange, PA-C  oxyCODONE (ROXICODONE) 5 MG immediate release tablet Take 1 tablet (5 mg total) by mouth every 4 (four) hours as needed for severe pain. 06/23/16   Nona Dell, PA-C  pantoprazole (PROTONIX) 20 MG tablet Take 1 tablet (20 mg total) by mouth daily. 01/06/17   Robbie Lis, MD  predniSONE (DELTASONE) 10 MG tablet Takes 6 tablets for 1 days, then 5 tablets for 1 days, then 4 tablets for 1 days, then 3 tablets for 1 days, then 2 tabs for 1 days, then 1 tab for 1 days, and then stop. 04/08/17   Charlynne Cousins, MD    ALLERGIES:  Allergies  Allergen Reactions  . Cheese Shortness Of Breath  . Chocolate Shortness Of Breath  . Ibuprofen Hives and Shortness Of Breath    Children's ibuprofen  . Ivp Dye [Iodinated Diagnostic Agents] Shortness Of Breath  .  Latex Anaphylaxis, Swelling and Other (See Comments)    Reaction:  Localized swelling   . Orange Juice [Orange Oil] Shortness Of Breath  . Other Hives and Other (See Comments)    Pt states that she is allergic to all steroids except IV solu-medrol.    Marland Kitchen Peach [Prunus Persica] Anaphylaxis  . Peanuts [Peanut Oil] Anaphylaxis  . Pear Anaphylaxis  . Prednisone Hives  . Raspberry Anaphylaxis  . Tylenol [Acetaminophen] Hives, Shortness Of Breath and Other (See Comments)    Pt states that this is only with the liquid form.    . Amoxicillin Hives and Other (See Comments)    Has patient had a PCN reaction causing immediate rash, facial/tongue/throat swelling,  SOB or lightheadedness with hypotension: No Has patient had a PCN reaction causing severe rash involving mucus membranes or skin necrosis: No Has patient had a PCN reaction that required hospitalization: No Has patient had a PCN reaction occurring within the last 10 years: No If all of the above answers are "NO", then may proceed with Cephalosporin use.  Marland Kitchen Apricot Flavor Hives  . Doxycycline Hives  . Erythromycin Hives  . Milk Of Magnesia [Magnesium Hydroxide] Hives and Itching  . Penicillins Hives and Other (See Comments)    Has patient had a PCN reaction causing immediate rash, facial/tongue/throat swelling, SOB or lightheadedness with hypotension: No Has patient had a PCN reaction causing severe rash involving mucus membranes or skin necrosis: No Has patient had a PCN reaction that required hospitalization: No Has patient had a PCN reaction occurring within the last 10 years: No If all of the above answers are "NO", then may proceed with Cephalosporin use.    SOCIAL HISTORY:  Social History   Tobacco Use  . Smoking status: Former Smoker    Packs/day: 0.25    Years: 4.00    Pack years: 1.00    Types: Cigarettes    Last attempt to quit: 10/02/2015    Years since quitting: 1.6  . Smokeless tobacco: Never Used  Substance Use Topics  . Alcohol use: No    Alcohol/week: 0.0 oz    Frequency: Never    FAMILY HISTORY: Family History  Problem Relation Age of Onset  . Cancer Mother        cervical cancer  . Asthma Mother   . Hypertension Father   . Sickle cell anemia Father   . Asthma Sister   . Diabetes Maternal Aunt   . Cancer Maternal Grandmother     EXAM: BP (!) 133/93 (BP Location: Right Arm)   Pulse (!) 109   Temp 98.5 F (36.9 C) (Oral)   Resp (!) 62 Comment: verified x2  Ht 5\' 4"  (1.626 m)   Wt 68 kg (150 lb)   LMP 05/15/2017   SpO2 100%   BMI 25.75 kg/m  CONSTITUTIONAL: Alert and oriented and responds appropriately to questions. Well-appearing;  well-nourished HEAD: Normocephalic EYES: Conjunctivae clear, pupils appear equal, EOMI ENT: normal nose; moist mucous membranes NECK: Supple, no meningismus, no nuchal rigidity, no LAD  CARD: Regular and tachycardic; S1 and S2 appreciated; no murmurs, no clicks, no rubs, no gallops RESP: Patient is tachypneic but when distracted her respiratory rate does slow down, she has diminished aeration diffusely, no wheezing or rhonchi, no rales, no hypoxia on her normal nasal cannula, able to speak full sentences but does so very quietly ABD/GI: Normal bowel sounds; non-distended; soft, non-tender, no rebound, no guarding, no peritoneal signs, no hepatosplenomegaly BACK:  The back appears  normal and is non-tender to palpation, there is no CVA tenderness EXT: Normal ROM in all joints; non-tender to palpation; no edema; normal capillary refill; no cyanosis, no calf tenderness or swelling    SKIN: Normal color for age and race; warm; no rash NEURO: Moves all extremities equally PSYCH: The patient's mood and manner are appropriate. Grooming and personal hygiene are appropriate.  MEDICAL DECISION MAKING: Patient here with shortness of breath.  Chest x-ray shows left basilar opacity that could reflect pneumonia.  Will give broad coverage with vancomycin, ceftazidime and Cipro given patient's history of CF.  Will obtain labs, cultures.  EKG shows no ischemic changes.  She is also high risk for PE.  Will obtain d-dimer.  She is allergic to IV contrast.  Will obtain ABG.  Anticipate admission.  ED PROGRESS: Patient feels as if she is improving.  Respiratory rate is improving.  Her blood gas is reassuring.  Lactate normal.  Troponin negative.  D-dimer normal.  Will discuss with medicine for admission.  7:13 AM Discussed patient's case with hospitalist, Dr. Erin Hearing NP.  I have recommended admission and patient (and family if present) agree with this plan. Admitting physician will place admission orders.   I  reviewed all nursing notes, vitals, pertinent previous records, EKGs, lab and urine results, imaging (as available).       EKG Interpretation  Date/Time:  Wednesday May 29 2017 04:20:59 EDT Ventricular Rate:  105 PR Interval:  116 QRS Duration: 94 QT Interval:  374 QTC Calculation: 494 R Axis:   54 Text Interpretation:  Sinus tachycardia Nonspecific T wave abnormality Abnormal ECG Confirmed by Pryor Curia [phone]) on 05/29/2017 4:30:55 AM         CRITICAL CARE Performed by: Cyril Mourning Ward   Total critical care time: 45 minutes  Critical care time was exclusive of separately billable procedures and treating other patients.  Critical care was necessary to treat or prevent imminent or life-threatening deterioration.  Critical care was time spent personally by me on the following activities: development of treatment plan with patient and/or surrogate as well as nursing, discussions with consultants, evaluation of patient's response to treatment, examination of patient, obtaining history from patient or surrogate, ordering and performing treatments and interventions, ordering and review of laboratory studies, ordering and review of radiographic studies, pulse oximetry and re-evaluation of patient's condition.    Ward, Delice Bison, DO 05/29/17 [phone]

## 2017-05-29 NOTE — ED Notes (Signed)
Patient is resting with call bell in reach  

## 2017-05-30 LAB — HIV ANTIBODY (ROUTINE TESTING W REFLEX): HIV Screen 4th Generation wRfx: NONREACTIVE

## 2017-06-03 DIAGNOSIS — R9431 Abnormal electrocardiogram [ECG] [EKG]: Secondary | ICD-10-CM | POA: Diagnosis not present

## 2017-06-03 DIAGNOSIS — R000 Tachycardia, unspecified: Secondary | ICD-10-CM | POA: Diagnosis not present

## 2017-06-03 LAB — CULTURE, BLOOD (ROUTINE X 2)
CULTURE: NO GROWTH
CULTURE: NO GROWTH
SPECIAL REQUESTS: ADEQUATE
Special Requests: ADEQUATE

## 2017-06-11 ENCOUNTER — Other Ambulatory Visit: Payer: Self-pay

## 2017-06-11 ENCOUNTER — Encounter (HOSPITAL_COMMUNITY): Payer: Self-pay

## 2017-06-11 ENCOUNTER — Emergency Department (HOSPITAL_COMMUNITY)
Admission: EM | Admit: 2017-06-11 | Discharge: 2017-06-11 | Disposition: A | Discharge status: Home / Self Care | Payer: Medicaid Other | Attending: Emergency Medicine | Admitting: Emergency Medicine

## 2017-06-11 DIAGNOSIS — Z5321 Procedure and treatment not carried out due to patient leaving prior to being seen by health care provider: Secondary | ICD-10-CM | POA: Diagnosis not present

## 2017-06-11 DIAGNOSIS — R569 Unspecified convulsions: Secondary | ICD-10-CM | POA: Diagnosis not present

## 2017-06-11 LAB — BASIC METABOLIC PANEL
ANION GAP: 11 (ref 5–15)
BUN: 6 mg/dL (ref 6–20)
CALCIUM: 9.5 mg/dL (ref 8.9–10.3)
CO2: 20 mmol/L — AB (ref 22–32)
Chloride: 106 mmol/L (ref 101–111)
Creatinine, Ser: 0.8 mg/dL (ref 0.44–1.00)
GFR calc non Af Amer: >60 mL/min (ref >60)
Glucose, Bld: 92 mg/dL (ref 65–99)
Potassium: 3.4 mmol/L — ABNORMAL LOW (ref 3.5–5.1)
SODIUM: 137 mmol/L (ref 135–145)

## 2017-06-11 LAB — CBC WITH DIFFERENTIAL/PLATELET
Abs Immature Granulocytes: 0 10*3/uL (ref 0.0–0.1)
Basophils Absolute: 0.1 10*3/uL (ref 0.0–0.1)
Basophils Relative: 0 %
Eosinophils Absolute: 0.1 10*3/uL (ref 0.0–0.7)
Eosinophils Relative: 1 %
HCT: 38.4 % (ref 36.0–46.0)
HEMOGLOBIN: 12.4 g/dL (ref 12.0–15.0)
IMMATURE GRANULOCYTES: 0 %
LYMPHS PCT: 21 %
Lymphs Abs: 2.4 10*3/uL (ref 0.7–4.0)
MCH: 28.9 pg (ref 26.0–34.0)
MCHC: 32.3 g/dL (ref 30.0–36.0)
MCV: 89.5 fL (ref 78.0–100.0)
Monocytes Absolute: 0.5 10*3/uL (ref 0.1–1.0)
Monocytes Relative: 4 %
NEUTROS ABS: 8.4 10*3/uL — AB (ref 1.7–7.7)
NEUTROS PCT: 74 %
Platelets: 299 10*3/uL (ref 150–400)
RBC: 4.29 MIL/uL (ref 3.87–5.11)
RDW: 13 % (ref 11.5–15.5)
WBC: 11.5 10*3/uL — AB (ref 4.0–10.5)
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
## 2017-06-11 NOTE — Progress Notes (Signed)
Patient has been called for twice for her CT scan without response from patient.
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
## 2017-06-12 ENCOUNTER — Emergency Department (HOSPITAL_COMMUNITY): Payer: Medicaid Other

## 2017-06-12 ENCOUNTER — Other Ambulatory Visit: Payer: Self-pay

## 2017-06-12 ENCOUNTER — Emergency Department (HOSPITAL_COMMUNITY)
Admission: EM | Admit: 2017-06-12 | Discharge: 2017-06-13 | Disposition: A | Discharge status: Home / Self Care | Payer: Medicaid Other | Attending: Emergency Medicine | Admitting: Emergency Medicine

## 2017-06-12 ENCOUNTER — Encounter (HOSPITAL_COMMUNITY): Payer: Self-pay | Admitting: Emergency Medicine

## 2017-06-12 DIAGNOSIS — M25531 Pain in right wrist: Secondary | ICD-10-CM | POA: Insufficient documentation

## 2017-06-12 DIAGNOSIS — Y999 Unspecified external cause status: Secondary | ICD-10-CM | POA: Diagnosis not present

## 2017-06-12 DIAGNOSIS — R569 Unspecified convulsions: Secondary | ICD-10-CM | POA: Diagnosis not present

## 2017-06-12 DIAGNOSIS — Z91040 Latex allergy status: Secondary | ICD-10-CM | POA: Insufficient documentation

## 2017-06-12 DIAGNOSIS — R51 Headache: Secondary | ICD-10-CM | POA: Insufficient documentation

## 2017-06-12 DIAGNOSIS — Y929 Unspecified place or not applicable: Secondary | ICD-10-CM | POA: Diagnosis not present

## 2017-06-12 DIAGNOSIS — M79631 Pain in right forearm: Secondary | ICD-10-CM | POA: Diagnosis not present

## 2017-06-12 DIAGNOSIS — J45909 Unspecified asthma, uncomplicated: Secondary | ICD-10-CM | POA: Diagnosis not present

## 2017-06-12 DIAGNOSIS — Y9389 Activity, other specified: Secondary | ICD-10-CM | POA: Insufficient documentation

## 2017-06-12 DIAGNOSIS — Z87891 Personal history of nicotine dependence: Secondary | ICD-10-CM | POA: Diagnosis not present

## 2017-06-12 DIAGNOSIS — Z79899 Other long term (current) drug therapy: Secondary | ICD-10-CM | POA: Diagnosis not present

## 2017-06-12 LAB — CBC WITH DIFFERENTIAL/PLATELET
Basophils Absolute: 0 10*3/uL (ref 0.0–0.1)
Basophils Relative: 0 %
EOS ABS: 0.2 10*3/uL (ref 0.0–0.7)
Eosinophils Relative: 1 %
HEMATOCRIT: 34.9 % — AB (ref 36.0–46.0)
Hemoglobin: 11.5 g/dL — ABNORMAL LOW (ref 12.0–15.0)
LYMPHS ABS: 2.9 10*3/uL (ref 0.7–4.0)
Lymphocytes Relative: 18 %
MCH: 29.3 pg (ref 26.0–34.0)
MCHC: 33 g/dL (ref 30.0–36.0)
MCV: 89 fL (ref 78.0–100.0)
MONOS PCT: 4 %
Monocytes Absolute: 0.6 10*3/uL (ref 0.1–1.0)
NEUTROS ABS: 12.8 10*3/uL — AB (ref 1.7–7.7)
NEUTROS PCT: 77 %
Platelets: 261 10*3/uL (ref 150–400)
RBC: 3.92 MIL/uL (ref 3.87–5.11)
RDW: 13.2 % (ref 11.5–15.5)
WBC: 16.5 10*3/uL — ABNORMAL HIGH (ref 4.0–10.5)

## 2017-06-12 LAB — COMPREHENSIVE METABOLIC PANEL
ALK PHOS: 52 U/L (ref 38–126)
ALT: 24 U/L (ref 14–54)
ANION GAP: 8 (ref 5–15)
AST: 18 U/L (ref 15–41)
Albumin: 4.2 g/dL (ref 3.5–5.0)
BILIRUBIN TOTAL: 0.2 mg/dL — AB (ref 0.3–1.2)
BUN: 15 mg/dL (ref 6–20)
CALCIUM: 9.3 mg/dL (ref 8.9–10.3)
CO2: 22 mmol/L (ref 22–32)
Chloride: 108 mmol/L (ref 101–111)
Creatinine, Ser: 0.8 mg/dL (ref 0.44–1.00)
GFR calc non Af Amer: >60 mL/min (ref >60)
Glucose, Bld: 87 mg/dL (ref 65–99)
Potassium: 3.6 mmol/L (ref 3.5–5.1)
Sodium: 138 mmol/L (ref 135–145)
TOTAL PROTEIN: 7.2 g/dL (ref 6.5–8.1)

## 2017-06-12 LAB — CBG MONITORING, ED: Glucose-Capillary: 77 mg/dL (ref 65–99)

## 2017-06-12 LAB — I-STAT BETA HCG BLOOD, ED (MC, WL, AP ONLY): I-stat hCG, quantitative: <5 m[IU]/mL (ref <5)

## 2017-06-12 MED ORDER — LEVETIRACETAM IN NACL 500 MG/100ML IV SOLN
500.0000 mg | Freq: Two times a day (BID) | INTRAVENOUS | Status: DC
  Administered 2017-06-12: 500 mg via INTRAVENOUS
  Filled 2017-06-12: qty 100

## 2017-06-12 MED ORDER — SODIUM CHLORIDE 0.9 % IV BOLUS
1000.0000 mL | Freq: Once | INTRAVENOUS | Status: AC
  Administered 2017-06-12: 1000 mL via INTRAVENOUS
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
## 2017-06-12 NOTE — Discharge Instructions (Addendum)
Please take all medications as instructed, including your seizure medication.
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
## 2017-07-05 ENCOUNTER — Encounter (HOSPITAL_COMMUNITY): Payer: Self-pay | Admitting: *Deleted

## 2017-07-05 ENCOUNTER — Emergency Department (HOSPITAL_COMMUNITY): Payer: Medicaid Other

## 2017-07-05 ENCOUNTER — Emergency Department (HOSPITAL_COMMUNITY)
Admission: EM | Admit: 2017-07-05 | Discharge: 2017-07-05 | Disposition: A | Discharge status: Home / Self Care | Payer: Medicaid Other | Attending: Emergency Medicine | Admitting: Emergency Medicine

## 2017-07-05 ENCOUNTER — Other Ambulatory Visit: Payer: Self-pay

## 2017-07-05 DIAGNOSIS — S8992XA Unspecified injury of left lower leg, initial encounter: Secondary | ICD-10-CM | POA: Diagnosis present

## 2017-07-05 DIAGNOSIS — Y939 Activity, unspecified: Secondary | ICD-10-CM | POA: Diagnosis not present

## 2017-07-05 DIAGNOSIS — Y998 Other external cause status: Secondary | ICD-10-CM | POA: Insufficient documentation

## 2017-07-05 DIAGNOSIS — Y929 Unspecified place or not applicable: Secondary | ICD-10-CM | POA: Insufficient documentation

## 2017-07-05 DIAGNOSIS — R0789 Other chest pain: Secondary | ICD-10-CM | POA: Diagnosis not present

## 2017-07-05 DIAGNOSIS — S8002XA Contusion of left knee, initial encounter: Secondary | ICD-10-CM

## 2017-07-05 DIAGNOSIS — Z79899 Other long term (current) drug therapy: Secondary | ICD-10-CM | POA: Diagnosis not present

## 2017-07-05 DIAGNOSIS — Z91040 Latex allergy status: Secondary | ICD-10-CM | POA: Insufficient documentation

## 2017-07-05 DIAGNOSIS — J45909 Unspecified asthma, uncomplicated: Secondary | ICD-10-CM | POA: Insufficient documentation

## 2017-07-05 DIAGNOSIS — Z87891 Personal history of nicotine dependence: Secondary | ICD-10-CM | POA: Insufficient documentation

## 2017-07-05 DIAGNOSIS — R52 Pain, unspecified: Secondary | ICD-10-CM

## 2017-07-05 DIAGNOSIS — R000 Tachycardia, unspecified: Secondary | ICD-10-CM | POA: Diagnosis not present

## 2017-07-05 LAB — BASIC METABOLIC PANEL
Anion gap: 10 (ref 5–15)
CALCIUM: 9.2 mg/dL (ref 8.9–10.3)
CHLORIDE: 105 mmol/L (ref 101–111)
CO2: 24 mmol/L (ref 22–32)
CREATININE: 0.64 mg/dL (ref 0.44–1.00)
GFR calc non Af Amer: >60 mL/min (ref >60)
Glucose, Bld: 79 mg/dL (ref 65–99)
Potassium: 3.4 mmol/L — ABNORMAL LOW (ref 3.5–5.1)
Sodium: 139 mmol/L (ref 135–145)

## 2017-07-05 LAB — CBC
HCT: 37 % (ref 36.0–46.0)
Hemoglobin: 11.3 g/dL — ABNORMAL LOW (ref 12.0–15.0)
MCH: 28.4 pg (ref 26.0–34.0)
MCHC: 30.5 g/dL (ref 30.0–36.0)
MCV: 93 fL (ref 78.0–100.0)
PLATELETS: 286 10*3/uL (ref 150–400)
RBC: 3.98 MIL/uL (ref 3.87–5.11)
RDW: 13.2 % (ref 11.5–15.5)
WBC: 15.2 10*3/uL — AB (ref 4.0–10.5)

## 2017-07-05 LAB — TROPONIN I: Troponin I: <0.03 ng/mL (ref <0.03)

## 2017-07-05 LAB — I-STAT BETA HCG BLOOD, ED (MC, WL, AP ONLY)

## 2017-07-05 MED ORDER — HYDROCODONE-ACETAMINOPHEN 5-325 MG PO TABS
1.0000 | ORAL_TABLET | Freq: Once | ORAL | Status: AC
  Administered 2017-07-05: 1 via ORAL
  Filled 2017-07-05: qty 1

## 2017-07-05 NOTE — ED Notes (Signed)
Dr Sabra Heck wanted this pt back after seeing her ekg
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
## 2017-07-05 NOTE — ED Notes (Signed)
Patient transported to x-ray. ?

## 2017-07-05 NOTE — ED Notes (Signed)
Patient transported to X-ray 
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
## 2017-07-05 NOTE — Progress Notes (Signed)
Orthopedic Tech Progress Note Patient Details:  Jasmine [name] March 27, 1990 [id number]  Ortho Devices Type of Ortho Device: Knee Immobilizer, Crutches Ortho Device/Splint Interventions: Application   Post Interventions Patient Tolerated: Well, Ambulated well Instructions Provided: Care of device, [place]oper ambulation with device   [place] Pink 07/05/2017, 10:09 PM

## 2017-07-05 NOTE — Discharge Instructions (Addendum)
Please return for any problem.  Use Ibuprofen and Tylenol for pain.  Ice the affected area.  Use crutches and knee immobilizer as instructed

## 2017-07-05 NOTE — ED Triage Notes (Signed)
The pt reports falling 2-3 days ago  She is c/o lt leg pain she is on 02   And has cystic fibrosis  lmp June 12th

## 2017-07-09 ENCOUNTER — Other Ambulatory Visit: Payer: Self-pay

## 2017-07-09 ENCOUNTER — Encounter (HOSPITAL_COMMUNITY): Payer: Self-pay | Admitting: Emergency Medicine

## 2017-07-09 ENCOUNTER — Emergency Department (HOSPITAL_COMMUNITY): Payer: Medicaid Other

## 2017-07-09 ENCOUNTER — Inpatient Hospital Stay (HOSPITAL_COMMUNITY)
Admission: EM | Admit: 2017-07-09 | Discharge: 2017-07-11 | DRG: 760 | Disposition: A | Discharge status: Home / Self Care | Payer: Medicaid Other | Attending: Internal Medicine | Admitting: Internal Medicine

## 2017-07-09 DIAGNOSIS — Z825 Family history of asthma and other chronic lower respiratory diseases: Secondary | ICD-10-CM

## 2017-07-09 DIAGNOSIS — R569 Unspecified convulsions: Secondary | ICD-10-CM

## 2017-07-09 DIAGNOSIS — Z91018 Allergy to other foods: Secondary | ICD-10-CM

## 2017-07-09 DIAGNOSIS — N920 Excessive and frequent menstruation with regular cycle: Secondary | ICD-10-CM | POA: Diagnosis present

## 2017-07-09 DIAGNOSIS — K219 Gastro-esophageal reflux disease without esophagitis: Secondary | ICD-10-CM | POA: Diagnosis not present

## 2017-07-09 DIAGNOSIS — F4323 Adjustment disorder with mixed anxiety and depressed mood: Secondary | ICD-10-CM | POA: Diagnosis present

## 2017-07-09 DIAGNOSIS — Z8249 Family history of ischemic heart disease and other diseases of the circulatory system: Secondary | ICD-10-CM

## 2017-07-09 DIAGNOSIS — N83202 Unspecified ovarian cyst, left side: Secondary | ICD-10-CM | POA: Diagnosis present

## 2017-07-09 DIAGNOSIS — K5900 Constipation, unspecified: Secondary | ICD-10-CM | POA: Diagnosis present

## 2017-07-09 DIAGNOSIS — Z881 Allergy status to other antibiotic agents status: Secondary | ICD-10-CM

## 2017-07-09 DIAGNOSIS — N946 Dysmenorrhea, unspecified: Secondary | ICD-10-CM | POA: Diagnosis present

## 2017-07-09 DIAGNOSIS — I82409 Acute embolism and thrombosis of unspecified deep veins of unspecified lower extremity: Secondary | ICD-10-CM | POA: Diagnosis not present

## 2017-07-09 DIAGNOSIS — R112 Nausea with vomiting, unspecified: Secondary | ICD-10-CM

## 2017-07-09 DIAGNOSIS — Z79891 Long term (current) use of opiate analgesic: Secondary | ICD-10-CM

## 2017-07-09 DIAGNOSIS — O2230 Deep phlebothrombosis in pregnancy, unspecified trimester: Secondary | ICD-10-CM | POA: Diagnosis not present

## 2017-07-09 DIAGNOSIS — N83201 Unspecified ovarian cyst, right side: Principal | ICD-10-CM | POA: Diagnosis present

## 2017-07-09 DIAGNOSIS — Z91010 Allergy to peanuts: Secondary | ICD-10-CM

## 2017-07-09 DIAGNOSIS — R109 Unspecified abdominal pain: Secondary | ICD-10-CM | POA: Diagnosis not present

## 2017-07-09 DIAGNOSIS — J45909 Unspecified asthma, uncomplicated: Secondary | ICD-10-CM | POA: Diagnosis present

## 2017-07-09 DIAGNOSIS — J9611 Chronic respiratory failure with hypoxia: Secondary | ICD-10-CM | POA: Diagnosis present

## 2017-07-09 DIAGNOSIS — L509 Urticaria, unspecified: Secondary | ICD-10-CM

## 2017-07-09 DIAGNOSIS — K861 Other chronic pancreatitis: Secondary | ICD-10-CM | POA: Diagnosis present

## 2017-07-09 DIAGNOSIS — Z86718 Personal history of other venous thrombosis and embolism: Secondary | ICD-10-CM

## 2017-07-09 DIAGNOSIS — R110 Nausea: Secondary | ICD-10-CM

## 2017-07-09 DIAGNOSIS — G4733 Obstructive sleep apnea (adult) (pediatric): Secondary | ICD-10-CM | POA: Diagnosis present

## 2017-07-09 DIAGNOSIS — Z91040 Latex allergy status: Secondary | ICD-10-CM

## 2017-07-09 DIAGNOSIS — Z9089 Acquired absence of other organs: Secondary | ICD-10-CM

## 2017-07-09 DIAGNOSIS — J4520 Mild intermittent asthma, uncomplicated: Secondary | ICD-10-CM

## 2017-07-09 DIAGNOSIS — D573 Sickle-cell trait: Secondary | ICD-10-CM | POA: Diagnosis present

## 2017-07-09 DIAGNOSIS — Z886 Allergy status to analgesic agent status: Secondary | ICD-10-CM

## 2017-07-09 DIAGNOSIS — Z87891 Personal history of nicotine dependence: Secondary | ICD-10-CM

## 2017-07-09 DIAGNOSIS — Z9049 Acquired absence of other specified parts of digestive tract: Secondary | ICD-10-CM

## 2017-07-09 DIAGNOSIS — G40909 Epilepsy, unspecified, not intractable, without status epilepticus: Secondary | ICD-10-CM | POA: Diagnosis present

## 2017-07-09 DIAGNOSIS — Z833 Family history of diabetes mellitus: Secondary | ICD-10-CM

## 2017-07-09 DIAGNOSIS — Z832 Family history of diseases of the blood and blood-forming organs and certain disorders involving the immune mechanism: Secondary | ICD-10-CM

## 2017-07-09 DIAGNOSIS — Z880 Allergy status to penicillin: Secondary | ICD-10-CM

## 2017-07-09 DIAGNOSIS — Z8049 Family history of malignant neoplasm of other genital organs: Secondary | ICD-10-CM

## 2017-07-09 DIAGNOSIS — R52 Pain, unspecified: Secondary | ICD-10-CM

## 2017-07-09 LAB — CBC WITH DIFFERENTIAL/PLATELET
ABS IMMATURE GRANULOCYTES: 0 10*3/uL (ref 0.0–0.1)
Basophils Absolute: 0 10*3/uL (ref 0.0–0.1)
Basophils Relative: 0 %
Eosinophils Absolute: 0.2 10*3/uL (ref 0.0–0.7)
Eosinophils Relative: 2 %
HEMATOCRIT: 38.5 % (ref 36.0–46.0)
HEMOGLOBIN: 12 g/dL (ref 12.0–15.0)
IMMATURE GRANULOCYTES: 0 %
LYMPHS ABS: 2.6 10*3/uL (ref 0.7–4.0)
LYMPHS PCT: 26 %
MCH: 28.1 pg (ref 26.0–34.0)
MCHC: 31.2 g/dL (ref 30.0–36.0)
MCV: 90.2 fL (ref 78.0–100.0)
Monocytes Absolute: 0.6 10*3/uL (ref 0.1–1.0)
Monocytes Relative: 6 %
NEUTROS ABS: 6.7 10*3/uL (ref 1.7–7.7)
NEUTROS PCT: 66 %
Platelets: 248 10*3/uL (ref 150–400)
RBC: 4.27 MIL/uL (ref 3.87–5.11)
RDW: 13.2 % (ref 11.5–15.5)
WBC: 10.2 10*3/uL (ref 4.0–10.5)

## 2017-07-09 LAB — URINALYSIS, ROUTINE W REFLEX MICROSCOPIC
Bilirubin Urine: NEGATIVE
GLUCOSE, UA: NEGATIVE mg/dL
Hgb urine dipstick: NEGATIVE
KETONES UR: NEGATIVE mg/dL
Leukocytes, UA: NEGATIVE
Nitrite: NEGATIVE
PH: 6 (ref 5.0–8.0)
Protein, ur: NEGATIVE mg/dL
Specific Gravity, Urine: 1.019 (ref 1.005–1.030)

## 2017-07-09 LAB — RAPID URINE DRUG SCREEN, HOSP PERFORMED
Amphetamines: NOT DETECTED
Benzodiazepines: NOT DETECTED
COCAINE: NOT DETECTED
Opiates: POSITIVE — AB
TETRAHYDROCANNABINOL: POSITIVE — AB

## 2017-07-09 LAB — I-STAT CG4 LACTIC ACID, ED
Lactic Acid, Venous: 1.18 mmol/L (ref 0.5–1.9)
Lactic Acid, Venous: 0.84 mmol/L (ref 0.5–1.9)

## 2017-07-09 LAB — COMPREHENSIVE METABOLIC PANEL
ALK PHOS: 43 U/L (ref 38–126)
ALT: 21 U/L (ref 0–44)
AST: 24 U/L (ref 15–41)
Albumin: 4.1 g/dL (ref 3.5–5.0)
Anion gap: 8 (ref 5–15)
BILIRUBIN TOTAL: 0.7 mg/dL (ref 0.3–1.2)
BUN: 7 mg/dL (ref 6–20)
CALCIUM: 9.2 mg/dL (ref 8.9–10.3)
CO2: 26 mmol/L (ref 22–32)
Chloride: 106 mmol/L (ref 98–111)
Creatinine, Ser: 0.61 mg/dL (ref 0.44–1.00)
GFR calc Af Amer: >60 mL/min (ref >60)
GFR calc non Af Amer: >60 mL/min (ref >60)
GLUCOSE: 90 mg/dL (ref 70–99)
Potassium: 3.9 mmol/L (ref 3.5–5.1)
Sodium: 140 mmol/L (ref 135–145)
TOTAL PROTEIN: 6.9 g/dL (ref 6.5–8.1)

## 2017-07-09 LAB — LIPASE, BLOOD: Lipase: 32 U/L (ref 11–51)

## 2017-07-09 LAB — PREGNANCY, URINE: PREG TEST UR: NEGATIVE

## 2017-07-09 MED ORDER — TOBRAMYCIN 300 MG/5ML IN NEBU
300.0000 mg | INHALATION_SOLUTION | Freq: Two times a day (BID) | RESPIRATORY_TRACT | Status: DC
  Administered 2017-07-10: 300 mg via RESPIRATORY_TRACT
  Filled 2017-07-09 (×2): qty 5

## 2017-07-09 MED ORDER — DORNASE ALFA 2.5 MG/2.5ML IN SOLN: 1.2500 mg | Freq: Every day | RESPIRATORY_TRACT | Status: DC

## 2017-07-09 MED ORDER — LEVETIRACETAM IN NACL 1000 MG/100ML IV SOLN
1000.0000 mg | Freq: Once | INTRAVENOUS | Status: AC
  Administered 2017-07-09: 1000 mg via INTRAVENOUS
  Filled 2017-07-09: qty 100

## 2017-07-09 MED ORDER — ONDANSETRON HCL 4 MG PO TABS: 4.0000 mg | ORAL_TABLET | Freq: Four times a day (QID) | ORAL | Status: DC | PRN

## 2017-07-09 MED ORDER — SODIUM CHLORIDE 0.9 % IV BOLUS
1000.0000 mL | Freq: Once | INTRAVENOUS | Status: AC
  Administered 2017-07-09: 1000 mL via INTRAVENOUS

## 2017-07-09 MED ORDER — ONDANSETRON HCL 4 MG/2ML IJ SOLN
4.0000 mg | Freq: Once | INTRAMUSCULAR | Status: AC
  Administered 2017-07-09: 4 mg via INTRAVENOUS
  Filled 2017-07-09: qty 2

## 2017-07-09 MED ORDER — ONDANSETRON HCL 4 MG/2ML IJ SOLN
4.0000 mg | Freq: Four times a day (QID) | INTRAMUSCULAR | Status: DC | PRN
  Administered 2017-07-09 – 2017-07-10 (×3): 4 mg via INTRAVENOUS
  Filled 2017-07-09 (×3): qty 2

## 2017-07-09 MED ORDER — SODIUM CHLORIDE 0.9 % IV SOLN
75.0000 mL/h | INTRAVENOUS | Status: DC
  Administered 2017-07-10: 75 mL/h via INTRAVENOUS

## 2017-07-09 MED ORDER — FAMOTIDINE IN NACL 20-0.9 MG/50ML-% IV SOLN
20.0000 mg | Freq: Two times a day (BID) | INTRAVENOUS | Status: DC
  Administered 2017-07-10 (×3): 20 mg via INTRAVENOUS
  Filled 2017-07-09 (×4): qty 50

## 2017-07-09 MED ORDER — LEVETIRACETAM IN NACL 500 MG/100ML IV SOLN
500.0000 mg | Freq: Two times a day (BID) | INTRAVENOUS | Status: DC
  Administered 2017-07-09 – 2017-07-10 (×3): 500 mg via INTRAVENOUS
  Filled 2017-07-09 (×4): qty 100

## 2017-07-09 MED ORDER — HYDROMORPHONE HCL 1 MG/ML IJ SOLN
1.0000 mg | Freq: Once | INTRAMUSCULAR | Status: AC
  Administered 2017-07-09: 1 mg via INTRAVENOUS
  Filled 2017-07-09: qty 1

## 2017-07-09 MED ORDER — SODIUM CHLORIDE 0.9 % IV SOLN
INTRAVENOUS | Status: DC
  Administered 2017-07-09: 1000 mL via INTRAVENOUS

## 2017-07-09 MED ORDER — SENNOSIDES-DOCUSATE SODIUM 8.6-50 MG PO TABS: 1.0000 | ORAL_TABLET | Freq: Every evening | ORAL | Status: DC | PRN

## 2017-07-09 MED ORDER — AZTREONAM LYSINE 75 MG IN SOLR: 75.0000 mg | Freq: Three times a day (TID) | RESPIRATORY_TRACT | Status: DC

## 2017-07-09 MED ORDER — DORNASE ALFA 2.5 MG/2.5ML IN SOLN
2.5000 mg | Freq: Two times a day (BID) | RESPIRATORY_TRACT | Status: DC
  Administered 2017-07-10 – 2017-07-11 (×3): 2.5 mg via RESPIRATORY_TRACT
  Filled 2017-07-09 (×8): qty 2.5

## 2017-07-09 MED ORDER — MORPHINE SULFATE (PF) 4 MG/ML IV SOLN
1.0000 mg | INTRAVENOUS | Status: DC | PRN
  Administered 2017-07-09 – 2017-07-10 (×3): 1 mg via INTRAVENOUS
  Filled 2017-07-09 (×3): qty 1

## 2017-07-09 MED ORDER — ALBUTEROL SULFATE (2.5 MG/3ML) 0.083% IN NEBU: 3.0000 mL | INHALATION_SOLUTION | RESPIRATORY_TRACT | Status: DC | PRN

## 2017-07-09 MED ORDER — PROMETHAZINE HCL 25 MG/ML IJ SOLN
25.0000 mg | Freq: Once | INTRAMUSCULAR | Status: AC
  Administered 2017-07-09: 25 mg via INTRAVENOUS
  Filled 2017-07-09: qty 1

## 2017-07-09 MED ORDER — BISACODYL 10 MG RE SUPP: 10.0000 mg | Freq: Every day | RECTAL | Status: DC | PRN
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
## 2017-07-09 NOTE — ED Provider Notes (Signed)
[facility] EMERGENCY DEPARTMENT Provider Note   CSN: [id number] Arrival date & time: 07/09/17  2703     History   Chief Complaint Chief Complaint  Patient presents with  . Emesis    HPI Jasmine [name] is a 27 y.o. female.  The history is provided by the patient and medical records. No language interpreter was used.  Emesis   This is a new problem. The current episode started more than 2 days ago. The problem occurs continuously. The problem has not changed since onset.The emesis has an appearance of stomach contents. There has been no fever. Associated symptoms include abdominal pain. Pertinent negatives include no chills, no cough, no diarrhea, no fever, no headaches and no URI.    Past Medical History:  Diagnosis Date  . Asthma   . Complication of anesthesia    "I wake up during anesthesia" (10/14/2015)  . 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  Marland Kitchen DVT (deep vein thrombosis) in pregnancy ([place])   . Epilepsy ([place])   . GERD (gastroesophageal reflux disease)   . Heart murmur    last work up age 34- no symtoms  . Pancreatitis   . Sickle cell trait ([place])   . Sleep apnea    "used to wear CPAP; don't have one here in  since I moved in 2014" (10/14/2015)    Patient Active Problem List   Diagnosis Date Noted  . History of DVT (deep vein thrombosis) in pregnancy ([place]) 05/29/2017  . Sickle cell trait ([place]) 05/29/2017  . Acute asthma exacerbation 05/29/2017  . Chronic pancreatitis ([place]) 05/29/2017  . Seizures ([place]) 05/29/2017  . Tobacco abuse 05/29/2017  . Acute on chronic respiratory failure with hypoxemia ([place]) 05/29/2017  . SOB (shortness of breath) 04/08/2017  . Cough 04/07/2017  . 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 Acute on chronic respiratory failure with hypoxia ([place]) 03/10/2017  . Colitis 11/12/2016  . Seizure ([place]) 11/12/2016  . Calculus of bile duct with acute cholecystitis with obstruction 10/14/2015  . Adjustment  disorder with mixed anxiety and depressed mood 04/26/2015  . Low blood potassium 04/19/2015  . Asthma with acute exacerbation 04/19/2015  . Asthma 01/03/2015  . Asthma, chronic, unspecified asthma severity, with acute exacerbation 01/03/2015  . Influenza-like illness 01/03/2015  . Status post cesarean section 11/08/2014  . Previous cesarean section complicating pregnancy, antepartum condition or complication   . GERD (gastroesophageal reflux disease)   . Gastroesophageal reflux disease without esophagitis   . Abnormal biochemical finding on antenatal screening of mother   . Abnormal quad screen   . Echogenic focus of heart of fetus affecting antepartum care of mother   . Drug use affecting pregnancy, antepartum 05/19/2014  . Seizure disorder during pregnancy, antepartum ([place]) 05/13/2014  . Supervision of high risk pregnancy, antepartum 05/13/2014  . Asthma complicating pregnancy, antepartum 05/13/2014  . History of food anaphylaxis 05/13/2014  . Seizure disorder, sept 2015 last seizure 01/19/2013    Past Surgical History:  Procedure Laterality Date  . APPENDECTOMY    . CESAREAN SECTION  2013  . CESAREAN SECTION N/A 11/08/2014   Procedure: CESAREAN SECTION;  Surgeon: Truett Mainland, DO;  Location: [facility];  Service: Obstetrics;  Laterality: N/A;  . CHOLECYSTECTOMY N/A 10/16/2015   Procedure: LAPAROSCOPIC CHOLECYSTECTOMY WITH POSSIBLE INTRAOPERATIVE CHOLANGIOGRAM;  Surgeon: Donnie Mesa, MD;  Location: [facility];  Service: General;  Laterality: N/A;  . ESOPHAGOGASTRODUODENOSCOPY (EGD) WITH PROPOFOL N/A 11/15/2016   Procedure: ESOPHAGOGASTRODUODENOSCOPY (EGD) WITH  PROPOFOL;  Surgeon: Clarene Essex, MD;  Location: [facility];  Service: Endoscopy;  Laterality: N/A;  . INGUINAL HERNIA REPAIR Bilateral ~ 1996  . NERVE, TENDON AND ARTERY REPAIR Right 09/23/2012   Procedure: I&D and Repair As Necessary/Right Hand and Palm;  Surgeon: Roseanne Kaufman, MD;  Location: [facility];  Service: Orthopedics;   Laterality: Right;  . TONSILLECTOMY       OB History    Gravida  3   Para  3   Term  3   Preterm      AB      Living  3     SAB      TAB      Ectopic      Multiple  0   Live Births  3            Home Medications    Prior to Admission medications   Medication Sig Start Date End Date Taking? Authorizing Provider  acetaminophen (TYLENOL) 325 MG tablet Take 325 mg by mouth every 6 (six) hours as needed for moderate pain.   Yes [provider]  albuterol (PROVENTIL HFA;VENTOLIN HFA) 108 (90 Base) MCG/ACT inhaler Inhale 2 puffs into the lungs every 4 (four) hours as needed for wheezing or shortness of breath. 01/06/17  Yes Robbie Lis, MD  Alum & Mag Hydroxide-Simeth (GI COCKTAIL) SUSP suspension Take 30 mLs by mouth 2 (two) times daily as needed for indigestion. Shake well.   Yes [provider]  Amylase-Lipase-Protease (CREON 20 PO) Take 2-7 capsules by mouth See admin instructions. Take 2-7 capsules by mouth with each meal and snack (dose based on amount of food intake)   Yes [provider]  Ascorbic Acid (VITAMIN C PO) Take 1 tablet by mouth daily.   Yes [provider]  Aztreonam Lysine 75 MG SOLR Take 75 mg by nebulization 3 (three) times daily.   Yes [provider]  Docusate Sodium (COLACE PO) Take 30 mLs by mouth daily.   Yes [provider]  dornase alpha (PULMOZYME) 1 MG/ML nebulizer solution Take 1.25 mg by nebulization daily.   Yes [provider]  etonogestrel (NEXPLANON) 68 MG IMPL implant 68 mg by Subdermal route once. Implanted December 2016   Yes [provider]  fluticasone (FLONASE) 50 MCG/ACT nasal spray Place 1 spray into both nostrils daily.   Yes [provider]  Fluticasone-Salmeterol (ADVAIR) 500-50 MCG/DOSE AEPB Inhale 1 puff into the lungs 2 (two) times daily.   Yes [provider]  HYDROcodone-acetaminophen (NORCO/VICODIN) 5-325 MG tablet Take 1 tablet  by mouth every 6 (six) hours as needed (pain).   Yes [provider]  LUMACAFTOR-IVACAFTOR PO Take by nebulization 2 (two) times daily.   Yes [provider]  montelukast (SINGULAIR) 10 MG tablet Take 10 mg by mouth at bedtime.   Yes [provider]  Multiple Vitamin (MULTIVITAMIN WITH MINERALS) TABS tablet Take 2 tablets by mouth daily.   Yes [provider]  oxyCODONE (ROXICODONE) 5 MG immediate release tablet Take 1 tablet (5 mg total) by mouth every 4 (four) hours as needed for severe pain. Patient taking differently: Take 5 mg by mouth every 4 (four) hours as needed for severe pain. Alternate with Hydrocodone 06/23/16  Yes Nona Dell, PA-C  tobramycin, PF, (TOBI) 300 MG/5ML nebulizer solution Take 300 mg by nebulization every 12 (twelve) hours.   Yes [provider]    Family History Family History  Problem Relation Age of  Onset  . Cancer Mother        cervical cancer  . Asthma Mother   . Hypertension Father   . Sickle cell anemia Father   . Asthma Sister   . Diabetes Maternal Aunt   . Cancer Maternal Grandmother     Social History Social History   Tobacco Use  . Smoking status: Former Smoker    Packs/day: 0.25    Years: 4.00    Pack years: 1.00    Types: Cigarettes    Last attempt to quit: 10/02/2015    Years since quitting: 1.7  . Smokeless tobacco: Never Used  Substance Use Topics  . Alcohol use: No    Alcohol/week: 0.0 oz    Frequency: Never  . Drug use: No     Allergies   Cheese; Chocolate; Ibuprofen; Ivp dye [iodinated diagnostic agents]; Latex; Orange juice [orange oil]; Other; Peach [prunus persica]; Peanuts [peanut oil]; Pear; Prednisone; Raspberry; Tylenol [acetaminophen]; Amoxicillin; Apricot flavor; Doxycycline; Erythromycin; Milk of magnesia [magnesium hydroxide]; and Penicillins   Review of Systems Review of Systems  Constitutional: Negative for chills, diaphoresis, fatigue and fever.  HENT:  Negative for congestion.   Respiratory: Negative for cough, chest tightness, shortness of breath and wheezing.   Cardiovascular: Negative for chest pain and palpitations.  Gastrointestinal: Positive for abdominal pain, constipation, nausea and vomiting. Negative for diarrhea.  Genitourinary: Negative for dysuria.  Musculoskeletal: Negative for back pain, neck pain and neck stiffness.  Skin: Negative for rash and wound.  Neurological: Negative for light-headedness and headaches.  Psychiatric/Behavioral: Negative for agitation.  All other systems reviewed and are negative.    Physical Exam Updated Vital Signs BP (!) 125/35   Pulse 72   Temp 98.6 F (37 C) (Oral)   Resp 20   LMP 06/26/2017   SpO2 100%   Physical Exam  Constitutional: She is oriented to person, place, and time. She appears well-developed and well-nourished. No distress.  HENT:  Head: Normocephalic and atraumatic.  Mouth/Throat: Oropharynx is clear and moist.  Eyes: Pupils are equal, round, and reactive to light. Conjunctivae and EOM are normal.  Neck: Normal range of motion. Neck supple.  Cardiovascular: Normal rate and regular rhythm.  No murmur heard. Pulmonary/Chest: Effort normal. No respiratory distress. She has rhonchi. She exhibits no tenderness.  Abdominal: Soft. There is tenderness. There is rebound.  Musculoskeletal: She exhibits no edema or tenderness.  Neurological: She is alert and oriented to person, place, and time. No sensory deficit. She exhibits normal muscle tone.  Skin: Skin is warm and dry. Capillary refill takes less than 2 seconds. No rash noted. She is not diaphoretic. No erythema.  Psychiatric: She has a normal mood and affect.  Nursing note and vitals reviewed.    ED Treatments / Results  Labs (all labs ordered are listed, but only abnormal results are displayed) Labs Reviewed  URINALYSIS, ROUTINE W REFLEX MICROSCOPIC - Abnormal; Notable for the following components:      Result  Value   APPearance HAZY (*)    All other components within normal limits  URINE CULTURE  CULTURE, BLOOD (ROUTINE X 2)  CULTURE, BLOOD (ROUTINE X 2)  CBC WITH DIFFERENTIAL/PLATELET  COMPREHENSIVE METABOLIC PANEL  LIPASE, BLOOD  PREGNANCY, URINE  RAPID URINE DRUG SCREEN, HOSP PERFORMED  POC URINE PREG, ED  I-STAT CG4 LACTIC ACID, ED  I-STAT CG4 LACTIC ACID, ED    EKG None  Radiology Ct Abdomen Pelvis Wo Contrast  Result Date: 07/09/2017 CLINICAL DATA:  Right upper  quadrant pain radiating to the back with vomiting. EXAM: CT ABDOMEN AND PELVIS WITHOUT CONTRAST TECHNIQUE: Multidetector CT imaging of the abdomen and pelvis was performed following the standard protocol without IV contrast. COMPARISON:  CT abdomen pelvis dated November 12, 2016. FINDINGS: Lower chest: No acute abnormality. Hepatobiliary: No focal liver abnormality is seen. Status post cholecystectomy. No biliary dilatation. Pancreas: Unremarkable. No pancreatic ductal dilatation or surrounding inflammatory changes. Spleen: Normal in size without focal abnormality. Adrenals/Urinary Tract: Adrenal glands are unremarkable. Kidneys are normal, without renal calculi, focal lesion, or hydronephrosis. Bladder is unremarkable. Stomach/Bowel: Stomach is within normal limits. Appendix is surgically absent. No evidence of bowel wall thickening, distention, or inflammatory changes. Vascular/Lymphatic: No significant vascular findings are present. No enlarged abdominal or pelvic lymph nodes. Reproductive: The uterus is unremarkable. There are new cystic lesions in both ovaries, measuring 6.4 cm on the left and 3.9 cm on the right. The left cystic lesion may demonstrate layering hyperdensity. Unchanged 10 mm low-density lesion along the left perineum. Other: Trace free fluid in the pelvis, likely physiologic. No pneumoperitoneum. Musculoskeletal: No acute or significant osseous findings. IMPRESSION: 1.  No acute intra-abdominal process. 2.  Benign-appearing cystic lesions in both ovaries. The left ovarian lesion measures 6.4 cm and may contain layering hyperdensity, suggestive of a hemorrhagic cyst. Given their size, follow-up pelvic ultrasound in 6-12 weeks is recommended. This recommendation follows ACR consensus guidelines: White Paper of the ACR Incidental Findings Committee II on Adnexal Findings. J Am Coll Radiol [phone]. Electronically Signed   By: Titus Dubin M.D.   On: 07/09/2017 12:17   Dg Chest 2 View  Result Date: 07/09/2017 CLINICAL DATA:  Right upper quadrant pain radiating to the back associated with nausea and vomiting. History of asthma, cystic fibrosis, and pancreatitis. EXAM: CHEST - 2 VIEW COMPARISON:  PA and lateral chest x-ray of July 05, 2017 FINDINGS: The heart size and mediastinal contours are within normal limits. Both lungs are clear. There is gentle curvature of the thoracolumbar spine convex toward the left centered at T12 and L1. IMPRESSION: There is no active cardiopulmonary disease. Electronically Signed   By: David  Martinique M.D.   On: 07/09/2017 11:53    Procedures Procedures (including critical care time)  CRITICAL CARE Performed by: Gwenyth Allegra Tegeler Total critical care time: 35 minutes Critical care time was exclusive of separately billable procedures and treating other patients. 3L fluid rescussciation for dehydration and intolerance of PO. IV seizure meds.  Critical care was necessary to treat or prevent imminent or life-threatening deterioration. Critical care was time spent personally by me on the following activities: development of treatment plan with patient and/or surrogate as well as nursing, discussions with consultants, evaluation of patient's response to treatment, examination of patient, obtaining history from patient or surrogate, ordering and performing treatments and interventions, ordering and review of laboratory studies, ordering and review of radiographic studies,  pulse oximetry and re-evaluation of patient's condition.   Medications Ordered in ED Medications  sodium chloride 0.9 % bolus 1,000 mL (has no administration in time range)  sodium chloride 0.9 % bolus 1,000 mL (0 mLs Intravenous Stopped 07/09/17 1427)  HYDROmorphone (DILAUDID) injection 1 mg (1 mg Intravenous Given 07/09/17 1251)  ondansetron (ZOFRAN) injection 4 mg (4 mg Intravenous Given 07/09/17 1250)  levETIRAcetam (KEPPRA) IVPB 1000 mg/100 mL premix (0 mg Intravenous Stopped 07/09/17 1321)  HYDROmorphone (DILAUDID) injection 1 mg (1 mg Intravenous Given 07/09/17 1446)  ondansetron (ZOFRAN) injection 4 mg (4 mg Intravenous Given 07/09/17 1446)  sodium chloride 0.9 % bolus 1,000 mL (1,000 mLs Intravenous New Bag/Given 07/09/17 1448)  promethazine (PHENERGAN) injection 25 mg (25 mg Intravenous Given 07/09/17 1607)     Initial Impression / Assessment and Plan / ED Course  I have reviewed the triage vital signs and the nursing notes.  Pertinent labs & imaging results that were available during my care of the patient were reviewed by me and considered in my medical decision making (see chart for details).     Aideliz Kapusta is a 27 y.o. female with a past medical history significant for cystic fibrosis, asthma, seizure disorder on Keppra, recurrent pancreatitis, GERD, and prior DVT who presents with abdominal pain, nausea, vomiting, and 2 seizures.  Patient reports that she has had 4 days of nonstop nausea vomiting and upper abdominal pain.  She describes no bowel movements or passage of gas since onset of her pain.  She says that she does not really keep any medicine down his facility has not been able to take her Keppra.  She has had 2 seizures today once at home and then once in route with EMS.  She reports no urinary symptoms, chest pain, or shortness of breath.  She does report that she recently had a Pseudomonas pneumonia and was admitted at [facility].  She denies any recent trauma.  She  still has her appendix but has had her gallbladder removed for cholecystitis.  She has not been able to tolerate fluids.  Next  On exam, patient appears uncomfortable.  Patient was lightly tachycardic on my initial exam.  Patient's abdomen was diffusely tender but worse in the epigastrium and right abdomen.  Patient's lungs were coarse in all lung fields.  Back was also tender in her CVA areas.  She denied any pelvic symptoms including a vaginal discharge or vaginal bleeding.  She reports that she cannot get pregnant from her cystic fibrosis.  Clinically I am concerned about obstruction, pancreatitis, or even appendicitis.  Patient is exquisitely tender and very uncomfortable.  Patient be on pain medicine, nausea medicine, and fluids.  She will have screening laboratory testing and a CT to further evaluate.  Patient cannot get contrast and will have a noncontrast scan  Patient is not tolerating any fluids so will not make her drink the contrast.  Patient  Anticipate reassessment after work-up.    Patient's work-up began to return reassuring.  Urinalysis did not show evidence of infection or blood.  Lactic acid not elevated.  Pregnancy is negative.  Lipase not elevated.  Metabolic panel was reassuring.  CBC shows no leukocytosis or anemia.  Chest x-ray showed no evidence of cardiac lung disease and the CT of the abdomen and pelvis did not show any acute cause of her symptoms.  There was evidence of a possible hemorrhagic cyst on the left ovary and follow-up imaging was recommended in 6 to 12 weeks.  Patient is not tenderness location, doubt this is the cause of her symptoms.    Patient be reassessed.  She also be given an IV load of Keppra given her multiple seizures today as she cannot take her medicines.    If patient is unable to tolerate fluids, patient may require admission for seizures and dehydration without taking her medicines.  If patient is able to tolerate fluids and given take her medicines,  she may be safe for discharge home with reassuring work-up.  3:27 PM Patient continued to have nausea and vomiting with oral intake.  Patient was given another  dose of pain medicine nausea medicine and fluids.  Anticipate reassessment.  Patient has normal tolerate p.o., patient may have to be admitted under observation for rehydration and further medication management.  4:36 PM On reassessment, patient reports that she has actually seized 3 times in the last 2 days when she is not tolerating her medicines.  Patient still feels extremely nauseous despite switching to Phenergan which she has tolerated in the past.  With her continued to have dry heaving, intolerance of p.o. including fluids or home medications, I do not feel patient is safe to go home and continue seizing.  Patient will be called for admission for rehydration and monitoring for her antiepileptics in the setting of likely a gastroenteritis.   Final Clinical Impressions(s) / ED Diagnoses   Final diagnoses:  Seizures ([place])  Intractable vomiting with nausea, unspecified vomiting type  Abdominal pain, unspecified abdominal location    Clinical Impression: 1. Seizures ([place])   2. Intractable vomiting with nausea, unspecified vomiting type   3. Abdominal pain, unspecified abdominal location     Disposition: Admit  This note was prepared with assistance of Dragon voice recognition software. Occasional wrong-word or sound-a-like substitutions may have occurred due to the inherent limitations of voice recognition software.       Tegeler, Gwenyth Allegra, MD 07/09/17 [phone]

## 2017-07-09 NOTE — H&P (Signed)
History and Physical    Jasmine [name] HUT:[id number] DOB: Jul 16, 1990 DOA: 07/09/2017   PCP: Patient, No Pcp Per   Patient coming from:  Home    Chief Complaint:  HPI: Jasmine [name] is a 27 y.o. 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  Because of the symptoms, which she attributed to possible viral gastroenteritis, she was unable to take her seizure medication (although on questioning she has not been taking because she "does not like how it feels)  Having  several episodes today, for which she sought medical attention.  She denies any syncope, presyncope, and she is awake and alert, denies any postictal state at this time.  She denies any chest pain or shortness of breath. Denies food poisoning. No tobacco. ETOH or recreational drug use . Denies being pregnant   ED Course:  BP (!) 126/53   Pulse 69   Temp 98.6 F (37 C) (Oral)   Resp 20   LMP 06/26/2017   SpO2 100%   Urine and blood culture are pending. Lactic acid 0.84. Base 32.  AST 24, ALT 21.  Total bilirubin 0.7. White count normal at 10.2, hemoglobin 12, platelets 248 CT of the abdomen and pelvis without acute intra-abdominal process, known ovarian cystic lesions with the left one appearing to be hemorrhagic cyst, which will follow by pelvic ultrasound as an outpatient. Because  of nausea, dry heaves, and seizures, the patient was given Dilaudid for pain, 2 boluses of IV fluid, and antiemetics, she also received Keppra IV x1 preg test neg    Review of Systems:  As per HPI otherwise all other systems reviewed and are negative  Past Medical History:  Diagnosis Date  . Asthma   . Complication of anesthesia    "I wake up  during anesthesia" (10/14/2015)  . Cystic fibrosis ([place])    O2 dependent-  very uncertain diagnosis!  Marland Kitchen DVT (deep vein thrombosis) in pregnancy ([place])   . Epilepsy ([place])   . GERD (gastroesophageal reflux disease)   . Heart murmur    last work up age 88- no symtoms  . Pancreatitis   . Sickle cell trait ([place])   . Sleep apnea    "used to wear CPAP; don't have one here in [place] since I moved in 2014" (10/14/2015)    Past Surgical History:  Procedure Laterality Date  . APPENDECTOMY    . CESAREAN SECTION  2013  . CESAREAN SECTION N/A 11/08/2014   Procedure: CESAREAN SECTION;  Surgeon: Truett Mainland, DO;  Location: [facility];  Service: Obstetrics;  Laterality: N/A;  . CHOLECYSTECTOMY N/A 10/16/2015   Procedure: LAPAROSCOPIC CHOLECYSTECTOMY WITH POSSIBLE INTRAOPERATIVE CHOLANGIOGRAM;  Surgeon: Donnie Mesa, MD;  Location: [facility];  Service: General;  Laterality: N/A;  . ESOPHAGOGASTRODUODENOSCOPY (EGD) WITH PROPOFOL N/A 11/15/2016   Procedure: ESOPHAGOGASTRODUODENOSCOPY (EGD) WITH PROPOFOL;  Surgeon: Clarene Essex, MD;  Location: [facility];  Service: Endoscopy;  Laterality: N/A;  . INGUINAL HERNIA REPAIR Bilateral ~ 1996  . NERVE, TENDON AND ARTERY REPAIR Right 09/23/2012   Procedure: I&D and Repair As Necessary/Right Hand and Palm;  Surgeon: Roseanne Kaufman, MD;  Location: [facility];  Service: Orthopedics;  Laterality: Right;  . TONSILLECTOMY      Social History Social History  Socioeconomic History  . Marital status: Single    Spouse name: Not on file  . Number of children: Not on file  . Years of education: Not on file  . Highest education level: Not on file  Occupational History  . Not on file  Social Needs  . Financial resource strain: Not on file  . Food insecurity:    Worry: Not on file    Inability: Not on file  . Transportation needs:    Medical: Not on file    Non-medical: Not on file  Tobacco Use  . Smoking status: Former Smoker    Packs/day: 0.25    Years: 4.00    Pack  years: 1.00    Types: Cigarettes    Last attempt to quit: 10/02/2015    Years since quitting: 1.7  . Smokeless tobacco: Never Used  Substance and Sexual Activity  . Alcohol use: No    Alcohol/week: 0.0 oz    Frequency: Never  . Drug use: No  . Sexual activity: Yes    Birth control/protection: None  Lifestyle  . Physical activity:    Days per week: Not on file    Minutes per session: Not on file  . Stress: Not on file  Relationships  . Social connections:    Talks on phone: Not on file    Gets together: Not on file    Attends religious service: Not on file    Active member of club or organization: Not on file    Attends meetings of clubs or organizations: Not on file    Relationship status: Not on file  . Intimate partner violence:    Fear of current or ex partner: Not on file    Emotionally abused: Not on file    Physically abused: Not on file    Forced sexual activity: Not on file  Other Topics Concern  . Not on file  Social History Narrative  . Not on file     Allergies  Allergen Reactions  . Cheese Shortness Of Breath  . Chocolate Shortness Of Breath  . Ibuprofen Hives and Shortness Of Breath    Children's ibuprofen  . Ivp Dye [Iodinated Diagnostic Agents] Shortness Of Breath  . Latex Anaphylaxis, Swelling and Other (See Comments)    Reaction:  Localized swelling   . Orange Juice [Orange Oil] Shortness Of Breath  . Other Hives and Other (See Comments)    Pt states that she is allergic to all steroids except IV solu-medrol.    Marland Kitchen Peach [Prunus Persica] Anaphylaxis  . Peanuts [Peanut Oil] Anaphylaxis  . Pear Anaphylaxis  . Prednisone Hives  . Raspberry Anaphylaxis  . Tylenol [Acetaminophen] Hives, Shortness Of Breath and Other (See Comments)    Pt states that this is only with the liquid form.    . Amoxicillin Hives and Other (See Comments)    Has patient had a PCN reaction causing immediate rash, facial/tongue/throat swelling, SOB or lightheadedness with  hypotension: No Has patient had a PCN reaction causing severe rash involving mucus membranes or skin necrosis: No Has patient had a PCN reaction that required hospitalization: No Has patient had a PCN reaction occurring within the last 10 years: No If all of the above answers are "NO", then may proceed with Cephalosporin use.  Marland Kitchen Apricot Flavor Hives  . Doxycycline Hives  . Erythromycin Hives  . Milk Of Magnesia [Magnesium Hydroxide] Hives and Itching  . Penicillins Hives and Other (See Comments)    Has patient  had a PCN reaction causing immediate rash, facial/tongue/throat swelling, SOB or lightheadedness with hypotension: No Has patient had a PCN reaction causing severe rash involving mucus membranes or skin necrosis: No Has patient had a PCN reaction that required hospitalization: No Has patient had a PCN reaction occurring within the last 10 years: No If all of the above answers are "NO", then may proceed with Cephalosporin use.    Family History  Problem Relation Age of Onset  . Cancer Mother        cervical cancer  . Asthma Mother   . Hypertension Father   . Sickle cell anemia Father   . Asthma Sister   . Diabetes Maternal Aunt   . Cancer Maternal Grandmother        Prior to Admission medications   Medication Sig Start Date End Date Taking? Authorizing Provider  acetaminophen (TYLENOL) 325 MG tablet Take 325 mg by mouth every 6 (six) hours as needed for moderate pain.   Yes [provider]  albuterol (PROVENTIL HFA;VENTOLIN HFA) 108 (90 Base) MCG/ACT inhaler Inhale 2 puffs into the lungs every 4 (four) hours as needed for wheezing or shortness of breath. 01/06/17  Yes Robbie Lis, MD  Alum & Mag Hydroxide-Simeth (GI COCKTAIL) SUSP suspension Take 30 mLs by mouth 2 (two) times daily as needed for indigestion. Shake well.   Yes [provider]  Amylase-Lipase-Protease (CREON 20 PO) Take 2-7 capsules by mouth See admin instructions. Take 2-7 capsules by mouth  with each meal and snack (dose based on amount of food intake)   Yes [provider]  Ascorbic Acid (VITAMIN C PO) Take 1 tablet by mouth daily.   Yes [provider]  Aztreonam Lysine 75 MG SOLR Take 75 mg by nebulization 3 (three) times daily.   Yes [provider]  Docusate Sodium (COLACE PO) Take 30 mLs by mouth daily.   Yes [provider]  dornase alpha (PULMOZYME) 1 MG/ML nebulizer solution Take 1.25 mg by nebulization daily.   Yes [provider]  etonogestrel (NEXPLANON) 68 MG IMPL implant 68 mg by Subdermal route once. Implanted December 2016   Yes [provider]  fluticasone (FLONASE) 50 MCG/ACT nasal spray Place 1 spray into both nostrils daily.   Yes [provider]  Fluticasone-Salmeterol (ADVAIR) 500-50 MCG/DOSE AEPB Inhale 1 puff into the lungs 2 (two) times daily.   Yes [provider]  HYDROcodone-acetaminophen (NORCO/VICODIN) 5-325 MG tablet Take 1 tablet by mouth every 6 (six) hours as needed (pain).   Yes [provider]  LUMACAFTOR-IVACAFTOR PO Take by nebulization 2 (two) times daily.   Yes [provider]  montelukast (SINGULAIR) 10 MG tablet Take 10 mg by mouth at bedtime.   Yes [provider]  Multiple Vitamin (MULTIVITAMIN WITH MINERALS) TABS tablet Take 2 tablets by mouth daily.   Yes [provider]  oxyCODONE (ROXICODONE) 5 MG immediate release tablet Take 1 tablet (5 mg total) by mouth every 4 (four) hours as needed for severe pain. Patient taking differently: Take 5 mg by mouth every 4 (four) hours as needed for severe pain. Alternate with Hydrocodone 06/23/16  Yes Nona Dell, PA-C  tobramycin, PF, (TOBI) 300 MG/5ML nebulizer solution Take 300 mg by nebulization every 12 (twelve) hours.   Yes [provider]     Physical Exam:  Vitals:   07/09/17 1300 07/09/17 1330 07/09/17 1402 07/09/17 1450  BP: 134/87 (!) 128/92 100/61 (!) 126/53    Pulse: 85  86 76 69  Resp:      Temp:      TempSrc:      SpO2: 100% 100% 100% 100%   Constitutional: NAD, uncomfortable, ill appearing Eyes: PERRL, lids and conjunctivae normal ENMT: Mucous membranes are moist, without exudate or lesions  Neck: normal, supple, no masses, no thyromegaly Respiratory:  Chronic wheezing  wheezing, no crackles. Normal respiratory effort at 4 L  Cardiovascular: Regular rate and rhythm,  murmur, rubs or gallops. No extremity edema. 2+ pedal pulses. No carotid bruits.  Abdomen: Soft, epigastric tenderness, No hepatosplenomegaly. Bowel sounds positive.  Musculoskeletal: no clubbing / cyanosis. Moves all extremities Skin: no jaundice, No lesions.  Neurologic: Sensation intact  Strength equal in all extremities Psychiatric:   Alert and oriented x 3. Normal mood.     Labs on Admission: I have personally reviewed following labs and imaging studies  CBC: Recent Labs  Lab 07/05/17 1844 07/09/17 1005  WBC 15.2* 10.2  NEUTROABS  --  6.7  HGB 11.3* 12.0  HCT 37.0 38.5  MCV 93.0 90.2  PLT 286 474    Basic Metabolic Panel: Recent Labs  Lab 07/05/17 1844 07/09/17 1005  NA 139 140  K 3.4* 3.9  CL 105 106  CO2 24 26  GLUCOSE 79 90  BUN <5* 7  CREATININE 0.64 0.61  CALCIUM 9.2 9.2    GFR: Estimated Creatinine Clearance: 100.9 mL/min (by C-G formula based on SCr of 0.61 mg/dL).  Liver Function Tests: Recent Labs  Lab 07/09/17 1005  AST 24  ALT 21  ALKPHOS 43  BILITOT 0.7  PROT 6.9  ALBUMIN 4.1   Recent Labs  Lab 07/09/17 1005  LIPASE 32   No results for input(s): AMMONIA in the last 168 hours.  Coagulation Profile: No results for input(s): INR, PROTIME in the last 168 hours.  Cardiac Enzymes: Recent Labs  Lab 07/05/17 1844  TROPONINI <0.03    BNP (last 3 results) No results for input(s): PROBNP in the last 8760 hours.  HbA1C: No results for input(s): HGBA1C in the last 72 hours.  CBG: No results for input(s): GLUCAP in  the last 168 hours.  Lipid Profile: No results for input(s): CHOL, HDL, LDLCALC, TRIG, CHOLHDL, LDLDIRECT in the last 72 hours.  Thyroid Function Tests: No results for input(s): TSH, T4TOTAL, FREET4, T3FREE, THYROIDAB in the last 72 hours.  Anemia Panel: No results for input(s): VITAMINB12, FOLATE, FERRITIN, TIBC, IRON, RETICCTPCT in the last 72 hours.  Urine analysis:    Component Value Date/Time   COLORURINE YELLOW 07/09/2017 1011   APPEARANCEUR HAZY (A) 07/09/2017 1011   LABSPEC 1.019 07/09/2017 1011   PHURINE 6.0 07/09/2017 1011   GLUCOSEU NEGATIVE 07/09/2017 1011   HGBUR NEGATIVE 07/09/2017 [place] 07/09/2017 1011   KETONESUR NEGATIVE 07/09/2017 1011   PROTEINUR NEGATIVE 07/09/2017 1011   UROBILINOGEN 1.0 11/04/2014 0946   NITRITE NEGATIVE 07/09/2017 1011   LEUKOCYTESUR NEGATIVE 07/09/2017 1011    Sepsis Labs: @LABRCNTIP (procalcitonin:4,lacticidven:4) )No results found for this or any previous visit (from the past 240 hour(s)).   Radiological Exams on Admission: Ct Abdomen Pelvis Wo Contrast  Result Date: 07/09/2017 CLINICAL DATA:  Right upper quadrant pain radiating to the back with vomiting. EXAM: CT ABDOMEN AND PELVIS WITHOUT CONTRAST TECHNIQUE: Multidetector CT imaging of the abdomen and pelvis was performed following the standard protocol without IV contrast. COMPARISON:  CT abdomen pelvis dated November 12, 2016. FINDINGS: Lower chest: No acute abnormality. Hepatobiliary: No focal liver abnormality is  seen. Status post cholecystectomy. No biliary dilatation. Pancreas: Unremarkable. No pancreatic ductal dilatation or surrounding inflammatory changes. Spleen: Normal in size without focal abnormality. Adrenals/Urinary Tract: Adrenal glands are unremarkable. Kidneys are normal, without renal calculi, focal lesion, or hydronephrosis. Bladder is unremarkable. Stomach/Bowel: Stomach is within normal limits. Appendix is surgically absent. No evidence of bowel  wall thickening, distention, or inflammatory changes. Vascular/Lymphatic: No significant vascular findings are present. No enlarged abdominal or pelvic lymph nodes. Reproductive: The uterus is unremarkable. There are new cystic lesions in both ovaries, measuring 6.4 cm on the left and 3.9 cm on the right. The left cystic lesion may demonstrate layering hyperdensity. Unchanged 10 mm low-density lesion along the left perineum. Other: Trace free fluid in the pelvis, likely physiologic. No pneumoperitoneum. Musculoskeletal: No acute or significant osseous findings. IMPRESSION: 1.  No acute intra-abdominal process. 2. Benign-appearing cystic lesions in both ovaries. The left ovarian lesion measures 6.4 cm and may contain layering hyperdensity, suggestive of a hemorrhagic cyst. Given their size, follow-up pelvic ultrasound in 6-12 weeks is recommended. This recommendation follows ACR consensus guidelines: White Paper of the ACR Incidental Findings Committee II on Adnexal Findings. J Am Coll Radiol [phone]. Electronically Signed   By: Titus Dubin M.D.   On: 07/09/2017 12:17   Dg Chest 2 View  Result Date: 07/09/2017 CLINICAL DATA:  Right upper quadrant pain radiating to the back associated with nausea and vomiting. History of asthma, cystic fibrosis, and pancreatitis. EXAM: CHEST - 2 VIEW COMPARISON:  PA and lateral chest x-ray of July 05, 2017 FINDINGS: The heart size and mediastinal contours are within normal limits. Both lungs are clear. There is gentle curvature of the thoracolumbar spine convex toward the left centered at T12 and L1. IMPRESSION: There is no active cardiopulmonary disease. Electronically Signed   By: David  Martinique M.D.   On: 07/09/2017 11:53    EKG: Independently reviewed.  Assessment/Plan Principal Problem:   Seizures (HCC) Active Problems:   Nausea with hives   GERD (gastroesophageal reflux disease)   Gastroesophageal reflux disease without esophagitis   Asthma    Adjustment disorder with mixed anxiety and depressed mood   Cystic fibrosis (HCC)   History of DVT (deep vein thrombosis) in pregnancy ([place])   Sickle cell trait ([facility])   Chronic pancreatitis ([facility]CC)   Seizures due to inability to take po  due to possible viral GI illness . Received IV Keppra load at the ED   Tele obs Continue Keppra IV till able to tolerate po , Pharmacy to manage   Intractable nausea and vomiting with constipation, supect viral illness. VSS and labs reassuring .CT A?P no acute changes . History of chronic pancreatitis  IV antiemetics   IV fluids at 100 cc/h NS   GI panel  NPO to Clear liquids diet, advance diet as tolerated Pain control  Continue Laxatives Once able to tolerate by mouth may resume Creon for chronic pancreatitis   GERD, Continue PPI  Asthma/ OSA , no acute issues. Continue O2  Continue nebs and CPAP, O2   Anxiety Continue home   Cystic fibrosis CXR NAD  Continue Pulmozyme, inhalers and nebs, tobramycin nebs  DVT prophylaxis:  SCD  Code Status:    Full  Family Communication:  Discussed with patient Disposition Plan: Expect patient to be discharged to home after condition improves Consults called:    None  Admission status:  Tele Obs    Sharene Butters, PA-C [facility]   Amion text  [phone]   07/09/2017,  5:15 PM

## 2017-07-09 NOTE — ED Notes (Signed)
Patient transported to X-ray via stretcher 

## 2017-07-10 DIAGNOSIS — G40909 Epilepsy, unspecified, not intractable, without status epilepticus: Secondary | ICD-10-CM | POA: Diagnosis present

## 2017-07-10 DIAGNOSIS — K5900 Constipation, unspecified: Secondary | ICD-10-CM | POA: Diagnosis present

## 2017-07-10 DIAGNOSIS — R569 Unspecified convulsions: Secondary | ICD-10-CM

## 2017-07-10 DIAGNOSIS — R112 Nausea with vomiting, unspecified: Secondary | ICD-10-CM | POA: Diagnosis present

## 2017-07-10 DIAGNOSIS — K861 Other chronic pancreatitis: Secondary | ICD-10-CM | POA: Diagnosis present

## 2017-07-10 DIAGNOSIS — N83201 Unspecified ovarian cyst, right side: Secondary | ICD-10-CM | POA: Diagnosis not present

## 2017-07-10 DIAGNOSIS — Z9089 Acquired absence of other organs: Secondary | ICD-10-CM | POA: Diagnosis not present

## 2017-07-10 DIAGNOSIS — D573 Sickle-cell trait: Secondary | ICD-10-CM | POA: Diagnosis present

## 2017-07-10 DIAGNOSIS — Z8249 Family history of ischemic heart disease and other diseases of the circulatory system: Secondary | ICD-10-CM | POA: Diagnosis not present

## 2017-07-10 DIAGNOSIS — N83202 Unspecified ovarian cyst, left side: Secondary | ICD-10-CM | POA: Diagnosis present

## 2017-07-10 DIAGNOSIS — Z825 Family history of asthma and other chronic lower respiratory diseases: Secondary | ICD-10-CM | POA: Diagnosis not present

## 2017-07-10 DIAGNOSIS — G4733 Obstructive sleep apnea (adult) (pediatric): Secondary | ICD-10-CM | POA: Diagnosis present

## 2017-07-10 DIAGNOSIS — N946 Dysmenorrhea, unspecified: Secondary | ICD-10-CM | POA: Diagnosis present

## 2017-07-10 DIAGNOSIS — Z8049 Family history of malignant neoplasm of other genital organs: Secondary | ICD-10-CM | POA: Diagnosis not present

## 2017-07-10 DIAGNOSIS — F4323 Adjustment disorder with mixed anxiety and depressed mood: Secondary | ICD-10-CM

## 2017-07-10 DIAGNOSIS — Z79891 Long term (current) use of opiate analgesic: Secondary | ICD-10-CM | POA: Diagnosis not present

## 2017-07-10 DIAGNOSIS — Z833 Family history of diabetes mellitus: Secondary | ICD-10-CM | POA: Diagnosis not present

## 2017-07-10 DIAGNOSIS — R109 Unspecified abdominal pain: Secondary | ICD-10-CM | POA: Diagnosis not present

## 2017-07-10 DIAGNOSIS — Z87891 Personal history of nicotine dependence: Secondary | ICD-10-CM | POA: Diagnosis not present

## 2017-07-10 DIAGNOSIS — J45909 Unspecified asthma, uncomplicated: Secondary | ICD-10-CM | POA: Diagnosis present

## 2017-07-10 DIAGNOSIS — Z832 Family history of diseases of the blood and blood-forming organs and certain disorders involving the immune mechanism: Secondary | ICD-10-CM | POA: Diagnosis not present

## 2017-07-10 DIAGNOSIS — K219 Gastro-esophageal reflux disease without esophagitis: Secondary | ICD-10-CM | POA: Diagnosis present

## 2017-07-10 DIAGNOSIS — N920 Excessive and frequent menstruation with regular cycle: Secondary | ICD-10-CM | POA: Diagnosis present

## 2017-07-10 DIAGNOSIS — Z86718 Personal history of other venous thrombosis and embolism: Secondary | ICD-10-CM | POA: Diagnosis not present

## 2017-07-10 DIAGNOSIS — Z9049 Acquired absence of other specified parts of digestive tract: Secondary | ICD-10-CM | POA: Diagnosis not present

## 2017-07-10 DIAGNOSIS — J9611 Chronic respiratory failure with hypoxia: Secondary | ICD-10-CM | POA: Diagnosis present

## 2017-07-10 LAB — CBC
HCT: 31.9 % — ABNORMAL LOW (ref 36.0–46.0)
Hemoglobin: 9.8 g/dL — ABNORMAL LOW (ref 12.0–15.0)
MCH: 28 pg (ref 26.0–34.0)
MCHC: 30.7 g/dL (ref 30.0–36.0)
MCV: 91.1 fL (ref 78.0–100.0)
PLATELETS: 194 10*3/uL (ref 150–400)
RBC: 3.5 MIL/uL — AB (ref 3.87–5.11)
RDW: 13.1 % (ref 11.5–15.5)
WBC: 9.7 10*3/uL (ref 4.0–10.5)

## 2017-07-10 LAB — BASIC METABOLIC PANEL
Anion gap: 5 (ref 5–15)
BUN: 8 mg/dL (ref 6–20)
CALCIUM: 7.8 mg/dL — AB (ref 8.9–10.3)
CHLORIDE: 111 mmol/L (ref 98–111)
CO2: 23 mmol/L (ref 22–32)
CREATININE: 0.72 mg/dL (ref 0.44–1.00)
GFR calc non Af Amer: >60 mL/min (ref >60)
GLUCOSE: 84 mg/dL (ref 70–99)
Potassium: 3.7 mmol/L (ref 3.5–5.1)
Sodium: 139 mmol/L (ref 135–145)

## 2017-07-10 MED ORDER — TOBRAMYCIN 300 MG/5ML IN NEBU
300.0000 mg | INHALATION_SOLUTION | Freq: Two times a day (BID) | RESPIRATORY_TRACT | Status: DC
  Administered 2017-07-10 – 2017-07-11 (×2): 300 mg via RESPIRATORY_TRACT
  Filled 2017-07-10 (×3): qty 5

## 2017-07-10 MED ORDER — ENOXAPARIN SODIUM 40 MG/0.4ML ~~LOC~~ SOLN
40.0000 mg | SUBCUTANEOUS | Status: DC
  Administered 2017-07-10: 40 mg via SUBCUTANEOUS
  Filled 2017-07-10: qty 0.4

## 2017-07-10 MED ORDER — HYDROCODONE-ACETAMINOPHEN 5-325 MG PO TABS
1.0000 | ORAL_TABLET | Freq: Four times a day (QID) | ORAL | Status: DC | PRN
Start: 2017-07-10 — End: 2017-07-11
  Administered 2017-07-10 (×2): 1 via ORAL
  Administered 2017-07-10: 2 via ORAL
  Administered 2017-07-11 (×2): 1 via ORAL
  Filled 2017-07-10 (×5): qty 1
  Filled 2017-07-10: qty 2

## 2017-07-10 NOTE — Progress Notes (Signed)
PROGRESS NOTE    Jasmine [name]  PRF:[id number] DOB: 1990-10-23 DOA: 07/09/2017 PCP: Patient, No Pcp Per  Brief Narrative:Jasmine [name] is a 27 y.o. 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, presenting with 3 days of nausea, R B/L ovarian cystic lesions, abdominal pain, constipation  Assessment & Plan:  Pelvic/R flank abd pain -likley due to large ovarian cysts with hemorrhage, noted on CT - no fevers, no leukocytosis -LFTs, lipase unremarkable, urine pregnancy negative -Prior history of cholecystectomy -follow-up pelvic ultrasound in 6-12 weeks recommended -Supportive care, clears and advance diet as tolerated, and antiemetics, pain control  History of seizures -continue Keppra IV until better able to tolerate by mouth  History of cystic fibrosis -Recent hospitalization at [facility] with Pseudomonas pneumonia -Completed long course of IV antibiotics last week -Follow-up with pulmonology at [facility], [place]  GERD -Continue PPI  Anxiety -Continue home regimen   DVT prophylaxis: and Lovenox Code Status: full code Family Communication:no family at bedside Disposition Plan: home in 1-2 days  Consultants:   none   Procedures:   Antimicrobials:    Subjective: -continues to have some nausea and lower abdominal pain  Objective: Vitals:   07/09/17 2324 07/10/17 0359 07/10/17 0753 07/10/17 1138  BP: (!) 136/104 104/64  104/88  Pulse: 80 67  69  Resp: 18 18  17   Temp: 98.3 F (36.8 C) 97.6 F (36.4 C)  98.3 F (36.8 C)  TempSrc: Oral Oral  Oral  SpO2: 100% 100% 99% 100%    Intake/Output Summary (Last 24 hours) at 07/10/2017 1356 Last data filed at 07/10/2017 0500 Gross per 24 hour  Intake 5591.78 ml  Output 350 ml  Net 5241.78 ml   There were no  vitals filed for this visit.  Examination:  General exam: Appears calm and comfortable  Respiratory system: Clear to auscultation. Respiratory effort normal. Cardiovascular system: S1 & S2 heard, RRR. No JVD, murmurs, rubs, gallops  Gastrointestinal system: Abdomen is nondistended, soft and mild right-sided tenderness, positive flank tendernessis Central nervous system: Alert and oriented. No focal neurological deficits. Extremities: Symmetric 5 x 5 power. Skin: No rashes, lesions or ulcers Psychiatry: Judgement and insight appear normal. Mood & affect appropriate.     Data Reviewed:   CBC: Recent Labs  Lab 07/05/17 1844 07/09/17 1005 07/10/17 0409  WBC 15.2* 10.2 9.7  NEUTROABS  --  6.7  --   HGB 11.3* 12.0 9.8*  HCT 37.0 38.5 31.9*  MCV 93.0 90.2 91.1  PLT 286 248 935   Basic Metabolic Panel: Recent Labs  Lab 07/05/17 1844 07/09/17 1005 07/10/17 0409  NA 139 140 139  K 3.4* 3.9 3.7  CL 105 106 111  CO2 24 26 23   GLUCOSE 79 90 84  BUN <5* 7 8  CREATININE 0.64 0.61 0.72  CALCIUM 9.2 9.2 7.8*   GFR: Estimated Creatinine Clearance: 100.9 mL/min (by C-G formula based on SCr of 0.72 mg/dL). Liver Function Tests: Recent Labs  Lab 07/09/17 1005  AST 24  ALT 21  ALKPHOS 43  BILITOT 0.7  PROT 6.9  ALBUMIN 4.1   Recent Labs  Lab 07/09/17 1005  LIPASE 32   No results for input(s): AMMONIA in the last 168 hours. Coagulation Profile: No results for input(s): INR, PROTIME in the last 168 hours. Cardiac Enzymes: Recent Labs  Lab 07/05/17  [place] <0.03   BNP (last 3 results) No results for input(s): PROBNP in the last 8760 hours. HbA1C: No results for input(s): HGBA1C in the last 72 hours. CBG: No results for input(s): GLUCAP in the last 168 hours. Lipid Profile: No results for input(s): CHOL, HDL, LDLCALC, TRIG, CHOLHDL, LDLDIRECT in the last 72 hours. Thyroid Function Tests: No results for input(s): TSH, T4TOTAL, FREET4, T3FREE, THYROIDAB in  the last 72 hours. Anemia Panel: No results for input(s): VITAMINB12, FOLATE, FERRITIN, TIBC, IRON, RETICCTPCT in the last 72 hours. Urine analysis:    Component Value Date/Time   COLORURINE YELLOW 07/09/2017 1011   APPEARANCEUR HAZY (A) 07/09/2017 1011   LABSPEC 1.019 07/09/2017 1011   PHURINE 6.0 07/09/2017 1011   GLUCOSEU NEGATIVE 07/09/2017 1011   HGBUR NEGATIVE 07/09/2017 [place] 07/09/2017 1011   KETONESUR NEGATIVE 07/09/2017 1011   PROTEINUR NEGATIVE 07/09/2017 1011   UROBILINOGEN 1.0 11/04/2014 0946   NITRITE NEGATIVE 07/09/2017 1011   LEUKOCYTESUR NEGATIVE 07/09/2017 1011   Sepsis Labs: @LABRCNTIP (procalcitonin:4,lacticidven:4)  ) Recent Results (from the past 240 hour(s))  Blood culture (routine x 2)     Status: None (Preliminary result)   Collection Time: 07/09/17 12:25 PM  Result Value Ref Range Status   Specimen Description BLOOD RIGHT HAND  Final   Special Requests   Final    BOTTLES DRAWN AEROBIC AND ANAEROBIC Blood Culture adequate volume   Culture   Final    NO GROWTH 1 DAY Performed at [facility], [place] [address] [place], [place] [address]    Report Status PENDING  Incomplete         Radiology Studies: Ct Abdomen Pelvis Wo Contrast  Result Date: 07/09/2017 CLINICAL DATA:  Right upper quadrant pain radiating to the back with vomiting. EXAM: CT ABDOMEN AND PELVIS WITHOUT CONTRAST TECHNIQUE: Multidetector CT imaging of the abdomen and pelvis was performed following the standard protocol without IV contrast. COMPARISON:  CT abdomen pelvis dated November 12, 2016. FINDINGS: Lower chest: No acute abnormality. Hepatobiliary: No focal liver abnormality is seen. Status post cholecystectomy. No biliary dilatation. Pancreas: Unremarkable. No pancreatic ductal dilatation or surrounding inflammatory changes. Spleen: Normal in size without focal abnormality. Adrenals/Urinary Tract: Adrenal glands are unremarkable. Kidneys are normal, without  renal calculi, focal lesion, or hydronephrosis. Bladder is unremarkable. Stomach/Bowel: Stomach is within normal limits. Appendix is surgically absent. No evidence of bowel wall thickening, distention, or inflammatory changes. Vascular/Lymphatic: No significant vascular findings are present. No enlarged abdominal or pelvic lymph nodes. Reproductive: The uterus is unremarkable. There are new cystic lesions in both ovaries, measuring 6.4 cm on the left and 3.9 cm on the right. The left cystic lesion may demonstrate layering hyperdensity. Unchanged 10 mm low-density lesion along the left perineum. Other: Trace free fluid in the pelvis, likely physiologic. No pneumoperitoneum. Musculoskeletal: No acute or significant osseous findings. IMPRESSION: 1.  No acute intra-abdominal process. 2. Benign-appearing cystic lesions in both ovaries. The left ovarian lesion measures 6.4 cm and may contain layering hyperdensity, suggestive of a hemorrhagic cyst. Given their size, follow-up pelvic ultrasound in 6-12 weeks is recommended. This recommendation follows ACR consensus guidelines: White Paper of the ACR Incidental Findings Committee II on Adnexal Findings. J Am Coll Radiol [phone]. Electronically Signed   By: Titus Dubin M.D.   On: 07/09/2017 12:17   Dg Chest 2 View  Result Date: 07/09/2017 CLINICAL DATA:  Right upper quadrant pain radiating to the back associated with nausea and vomiting. History  of asthma, cystic fibrosis, and pancreatitis. EXAM: CHEST - 2 VIEW COMPARISON:  PA and lateral chest x-ray of July 05, 2017 FINDINGS: The heart size and mediastinal contours are within normal limits. Both lungs are clear. There is gentle curvature of the thoracolumbar spine convex toward the left centered at T12 and L1. IMPRESSION: There is no active cardiopulmonary disease. Electronically Signed   By: David  [place]ique M.D.   On: 07/09/2017 11:53        Scheduled Meds: . dornase alpha  2.5 mg Nebulization BID  .  tobramycin (PF)  300 mg Nebulization BID   Continuous Infusions: . sodium chloride    . famotidine (PEPCID) IV 20 mg (07/10/17 1119)  . levETIRAcetam 500 mg (07/10/17 1027)     LOS: 0 days    Time spent: 22min    Domenic Polite, MD [facility] Page via [email or web address], password [email or web address] After 7PM please contact night-coverage  07/10/2017, 1:56 PM

## 2017-07-11 LAB — COMPREHENSIVE METABOLIC PANEL
ALT: 36 U/L (ref 0–44)
AST: 36 U/L (ref 15–41)
Albumin: 3.4 g/dL — ABNORMAL LOW (ref 3.5–5.0)
Alkaline Phosphatase: 39 U/L (ref 38–126)
Anion gap: 6 (ref 5–15)
BILIRUBIN TOTAL: 0.5 mg/dL (ref 0.3–1.2)
CHLORIDE: 108 mmol/L (ref 98–111)
CO2: 24 mmol/L (ref 22–32)
CREATININE: 0.54 mg/dL (ref 0.44–1.00)
Calcium: 8.3 mg/dL — ABNORMAL LOW (ref 8.9–10.3)
Glucose, Bld: 88 mg/dL (ref 70–99)
Potassium: 3.2 mmol/L — ABNORMAL LOW (ref 3.5–5.1)
Sodium: 138 mmol/L (ref 135–145)
Total Protein: 5.9 g/dL — ABNORMAL LOW (ref 6.5–8.1)

## 2017-07-11 LAB — CBC
HEMATOCRIT: 33.7 % — AB (ref 36.0–46.0)
Hemoglobin: 10.5 g/dL — ABNORMAL LOW (ref 12.0–15.0)
MCH: 28 pg (ref 26.0–34.0)
MCHC: 31.2 g/dL (ref 30.0–36.0)
MCV: 89.9 fL (ref 78.0–100.0)
PLATELETS: 209 10*3/uL (ref 150–400)
RBC: 3.75 MIL/uL — ABNORMAL LOW (ref 3.87–5.11)
RDW: 13.1 % (ref 11.5–15.5)
WBC: 9.1 10*3/uL (ref 4.0–10.5)

## 2017-07-11 LAB — URINE CULTURE

## 2017-07-11 MED ORDER — LEVETIRACETAM 500 MG PO TABS: 500.0000 mg | ORAL_TABLET | Freq: Two times a day (BID) | ORAL | 1 refills | Status: DC

## 2017-07-11 MED ORDER — DICYCLOMINE HCL 10 MG PO CAPS
10.0000 mg | ORAL_CAPSULE | Freq: Once | ORAL | Status: AC
  Administered 2017-07-11: 10 mg via ORAL
  Filled 2017-07-11: qty 1

## 2017-07-11 MED ORDER — LEVETIRACETAM 500 MG PO TABS
500.0000 mg | ORAL_TABLET | Freq: Two times a day (BID) | ORAL | Status: DC
  Administered 2017-07-11: 500 mg via ORAL
  Filled 2017-07-11: qty 1

## 2017-07-11 MED ORDER — NAPROXEN 500 MG PO TABS: 500.0000 mg | ORAL_TABLET | Freq: Two times a day (BID) | ORAL | 0 refills | Status: DC

## 2017-07-11 MED ORDER — FAMOTIDINE 20 MG PO TABS: 20.0000 mg | ORAL_TABLET | Freq: Two times a day (BID) | ORAL | Status: DC

## 2017-07-11 MED ORDER — POTASSIUM CHLORIDE CRYS ER 20 MEQ PO TBCR
40.0000 meq | EXTENDED_RELEASE_TABLET | Freq: Once | ORAL | Status: AC
  Administered 2017-07-11: 40 meq via ORAL
  Filled 2017-07-11: qty 2

## 2017-07-11 MED ORDER — HYDROCODONE-ACETAMINOPHEN 5-325 MG PO TABS: 1.0000 | ORAL_TABLET | Freq: Four times a day (QID) | ORAL | 0 refills | Status: DC | PRN

## 2017-07-11 MED ORDER — NAPROXEN 250 MG PO TABS
250.0000 mg | ORAL_TABLET | Freq: Two times a day (BID) | ORAL | Status: DC
  Administered 2017-07-11: 250 mg via ORAL
  Filled 2017-07-11: qty 1

## 2017-07-11 NOTE — Plan of Care (Signed)
Adequate for discharge.

## 2017-07-11 NOTE — Discharge Summary (Signed)
Physician Discharge Summary  Rise Traeger HYQ:[id number] DOB: Jan 01, 1991 DOA: 07/09/2017  PCP: Patient, No Pcp Per  Admit date: 07/09/2017 Discharge date: 07/11/2017  Time spent: 35 minutes  Recommendations for Outpatient Follow-up:  PCP in 1 week Gynecology Dr.Jacob Stinson on 7/1 at [facility]  Discharge Diagnoses:    Lower pelvis abdominal pain   Large bilateral ovarian cysts   Menorrhagia   Suspected DuB   Seizures ([place])   GERD (gastroesophageal reflux disease)   Gastroesophageal reflux disease without esophagitis   Asthma   Adjustment disorder with mixed anxiety and depressed mood   Seizure (HCC)   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, presenting with 3 days of nausea, lower pelvic abdominal pain,  Hospital Course:   Lower Pelvic abdominal and flank pain associated with nausea and vomiting -She was afebrile without any leukocytosis, LFTs lipase, urinalysis were within normal limits, urine pregnancy was negative, prior history of cholecystectomy -had a CT scan on admission which noted large bilateral ovarian cysts with suspected hemorrhage and trace ascites, likely physiological -Long history of severe menorrhagia, dysmenorrhea, pain related to ovarian cysts, previously has been followed by OB/GYN for this, was briefly treated with some hormonal therapy and reports having had surgery for this as well  however I do not see any evidence of this on Epic -treated supportively with bowel rest, antiemetics, pain meds -She chronically takes Vicodin and naproxen for severe dysmenorrhea at baseline -Due to large ovarian cysts, follow-up pelvic ultrasound in 6-12 weeks recommended -she has an upcoming appointment with gynecology Dr. Loma Boston on Monday 7/1 at [facility], at this time continues to have pain but it is improving on NSAIDs and Vicodin, tolerating a regular diet and feels well enough to go home and follow-up with gynecology in 3 days  History of seizures -Keppra resumed  History of cystic fibrosis -Recent hospitalization at [facility] with Pseudomonas pneumonia -Completed long course of IV antibiotics last week -Follow-up with pulmonology at [facility]  GERD -Continue PPI  Anxiety -Continue home regimen     Discharge Exam: Vitals:   07/11/17 0743 07/11/17 1142  BP: 117/62 127/90  Pulse: 69 (!) 57  Resp: 16 17  Temp: 98.6 F (37 C) 98.7 F (37.1 C)  SpO2: 100% 100%    General: AAOx3 Cardiovascular: S1S2/RRR Respiratory: CTAB  Discharge Instructions   Discharge Instructions    Diet - low sodium heart healthy   Complete by:  As directed    Increase activity slowly   Complete by:  As directed      Allergies as of 07/11/2017      Reactions   Cheese Shortness Of Breath   Chocolate Shortness Of Breath   Ibuprofen Hives, Shortness Of Breath   Children's ibuprofen   Ivp Dye [iodinated Diagnostic Agents] Shortness Of Breath   Latex Anaphylaxis, Swelling, Other (See Comments)   Reaction:  Localized swelling    Orange Juice [orange Oil] Shortness Of Breath   Other Hives,  Other (See Comments)   Pt states that she is allergic to all steroids except IV solu-medrol.     Peach [prunus Persica] Anaphylaxis   Peanuts [peanut Oil] Anaphylaxis   Pear Anaphylaxis   Prednisone Hives   Raspberry Anaphylaxis   Tylenol [acetaminophen] Hives, Shortness Of Breath,  Other (See Comments)   Pt states that this is only with the liquid form.     Amoxicillin Hives, Other (See Comments)   Has patient had a PCN reaction causing immediate rash, facial/tongue/throat swelling, SOB or lightheadedness with hypotension: No Has patient had a PCN reaction causing severe rash involving mucus membranes or skin necrosis: No Has patient had a PCN reaction that required hospitalization: No Has patient had a PCN reaction occurring within the last 10 years: No If all of the above answers are "NO", then may proceed with Cephalosporin use.   Apricot Flavor Hives   Doxycycline Hives   Erythromycin Hives   Milk Of Magnesia [magnesium Hydroxide] Hives, Itching   Penicillins Hives, Other (See Comments)   Has patient had a PCN reaction causing immediate rash, facial/tongue/throat swelling, SOB or lightheadedness with hypotension: No Has patient had a PCN reaction causing severe rash involving mucus membranes or skin necrosis: No Has patient had a PCN reaction that required hospitalization: No Has patient had a PCN reaction occurring within the last 10 years: No If all of the above answers are "NO", then may proceed with Cephalosporin use.      Medication List    STOP taking these medications   oxyCODONE 5 MG immediate release tablet Commonly known as:  ROXICODONE     TAKE these medications   acetaminophen 325 MG tablet Commonly known as:  TYLENOL Take 325 mg by mouth every 6 (six) hours as needed for moderate pain.   albuterol 108 (90 Base) MCG/ACT inhaler Commonly known as:  PROVENTIL HFA;VENTOLIN HFA Inhale 2 puffs into the lungs every 4 (four) hours as needed for wheezing or shortness of breath.   Aztreonam Lysine 75 MG Solr Take 75 mg by nebulization 3 (three) times daily.   COLACE PO Take 30 mLs by mouth daily.   CREON 20 PO Take 2-7 capsules by mouth See admin instructions. Take 2-7 capsules by mouth with each meal and snack (dose based on amount of food  intake)   dornase alpha 1 MG/ML nebulizer solution Commonly known as:  PULMOZYME Take 2.5 mg by nebulization 2 (two) times daily.   fluticasone 50 MCG/ACT nasal spray Commonly known as:  FLONASE Place 1 spray into both nostrils daily.   Fluticasone-Salmeterol 500-50 MCG/DOSE Aepb Commonly known as:  ADVAIR Inhale 1 puff into the lungs 2 (two) times daily.   gi cocktail Susp suspension Take 30 mLs by mouth 2 (two) times daily as needed for indigestion. Shake well.   HYDROcodone-acetaminophen 5-325 MG tablet Commonly known as:  NORCO/VICODIN Take 1 tablet by mouth every 6 (six) hours as needed for moderate pain (pain). What changed:  reasons to take this   levETIRAcetam 500 MG tablet Commonly known as:  KEPPRA Take 1 tablet (500 mg total) by mouth 2 (two) times daily.   LUMACAFTOR-IVACAFTOR PO Take by nebulization 2 (two) times daily.   montelukast 10 MG tablet Commonly known as:  SINGULAIR Take 10 mg by mouth at bedtime.   multivitamin with minerals Tabs tablet Take 2 tablets by mouth daily.   naproxen 500 MG tablet Commonly known as:  NAPROSYN Take 1 tablet (500 mg total) by mouth 2 (two)  times daily with a meal. Take with meals for next 2-3days   NEXPLANON 68 MG Impl implant Generic drug:  etonogestrel 68 mg by Subdermal route once. Implanted December 2016   tobramycin (PF) 300 MG/5ML nebulizer solution Commonly known as:  TOBI Take 300 mg by nebulization every 12 (twelve) hours.   VITAMIN C PO Take 1 tablet by mouth daily.      Allergies  Allergen Reactions  . Cheese Shortness Of Breath  . Chocolate Shortness Of Breath  . Ibuprofen Hives and Shortness Of Breath    Children's ibuprofen  . Ivp Dye [Iodinated Diagnostic Agents] Shortness Of Breath  . Latex Anaphylaxis, Swelling and Other (See Comments)    Reaction:  Localized swelling   . Orange Juice [Orange Oil] Shortness Of Breath  . Other Hives and Other (See Comments)    Pt states that she is  allergic to all steroids except IV solu-medrol.    Marland Kitchen Peach [Prunus Persica] Anaphylaxis  . Peanuts [Peanut Oil] Anaphylaxis  . Pear Anaphylaxis  . Prednisone Hives  . Raspberry Anaphylaxis  . Tylenol [Acetaminophen] Hives, Shortness Of Breath and Other (See Comments)    Pt states that this is only with the liquid form.    . Amoxicillin Hives and Other (See Comments)    Has patient had a PCN reaction causing immediate rash, facial/tongue/throat swelling, SOB or lightheadedness with hypotension: No Has patient had a PCN reaction causing severe rash involving mucus membranes or skin necrosis: No Has patient had a PCN reaction that required hospitalization: No Has patient had a PCN reaction occurring within the last 10 years: No If all of the above answers are "NO", then may proceed with Cephalosporin use.  Marland Kitchen Apricot Flavor Hives  . Doxycycline Hives  . Erythromycin Hives  . Milk Of Magnesia [Magnesium Hydroxide] Hives and Itching  . Penicillins Hives and Other (See Comments)    Has patient had a PCN reaction causing immediate rash, facial/tongue/throat swelling, SOB or lightheadedness with hypotension: No Has patient had a PCN reaction causing severe rash involving mucus membranes or skin necrosis: No Has patient had a PCN reaction that required hospitalization: No Has patient had a PCN reaction occurring within the last 10 years: No If all of the above answers are "NO", then may proceed with Cephalosporin use.   Follow-up Information    PCP. Schedule an appointment as soon as possible for a visit in 1 week(s).        Truett Mainland, DO Follow up on 07/15/2017.   Specialty:  Family Medicine Why:  Please keep this appointment Contact information: [place] [address] [phone]            The results of significant diagnostics from this hospitalization (including imaging, microbiology, ancillary and laboratory) are listed below for reference.     Significant Diagnostic Studies: Ct Abdomen Pelvis Wo Contrast  Result Date: 07/09/2017 CLINICAL DATA:  Right upper quadrant pain radiating to the back with vomiting. EXAM: CT ABDOMEN AND PELVIS WITHOUT CONTRAST TECHNIQUE: Multidetector CT imaging of the abdomen and pelvis was performed following the standard protocol without IV contrast. COMPARISON:  CT abdomen pelvis dated November 12, 2016. FINDINGS: Lower chest: No acute abnormality. Hepatobiliary: No focal liver abnormality is seen. Status post cholecystectomy. No biliary dilatation. Pancreas: Unremarkable. No pancreatic ductal dilatation or surrounding inflammatory changes. Spleen: Normal in size without focal abnormality. Adrenals/Urinary Tract: Adrenal glands are unremarkable. Kidneys are normal, without renal calculi, focal lesion, or hydronephrosis. Bladder  is unremarkable. Stomach/Bowel: Stomach is within normal limits. Appendix is surgically absent. No evidence of bowel wall thickening, distention, or inflammatory changes. Vascular/Lymphatic: No significant vascular findings are present. No enlarged abdominal or pelvic lymph nodes. Reproductive: The uterus is unremarkable. There are new cystic lesions in both ovaries, measuring 6.4 cm on the left and 3.9 cm on the right. The left cystic lesion may demonstrate layering hyperdensity. Unchanged 10 mm low-density lesion along the left perineum. Other: Trace free fluid in the pelvis, likely physiologic. No pneumoperitoneum. Musculoskeletal: No acute or significant osseous findings. IMPRESSION: 1.  No acute intra-abdominal process. 2. Benign-appearing cystic lesions in both ovaries. The left ovarian lesion measures 6.4 cm and may contain layering hyperdensity, suggestive of a hemorrhagic cyst. Given their size, follow-up pelvic ultrasound in 6-12 weeks is recommended. This recommendation follows ACR consensus guidelines: White Paper of the ACR Incidental Findings Committee II on Adnexal Findings. J Am  Coll Radiol [phone]. Electronically Signed   By: Titus Dubin M.D.   On: 07/09/2017 12:17   Dg Chest 2 View  Result Date: 07/09/2017 CLINICAL DATA:  Right upper quadrant pain radiating to the back associated with nausea and vomiting. History of asthma, cystic fibrosis, and pancreatitis. EXAM: CHEST - 2 VIEW COMPARISON:  PA and lateral chest x-ray of July 05, 2017 FINDINGS: The heart size and mediastinal contours are within normal limits. Both lungs are clear. There is gentle curvature of the thoracolumbar spine convex toward the left centered at T12 and L1. IMPRESSION: There is no active cardiopulmonary disease. Electronically Signed   By: David  Martinique M.D.   On: 07/09/2017 11:53   Dg Chest 2 View  Result Date: 07/05/2017 CLINICAL DATA:  Shortness of breath. EXAM: CHEST - 2 VIEW COMPARISON:  Radiographs of Jun 12, 2017. FINDINGS: The heart size and mediastinal contours are within normal limits. Both lungs are clear. No pneumothorax or pleural effusion is noted. The visualized skeletal structures are unremarkable. IMPRESSION: No active cardiopulmonary disease. Electronically Signed   By: Marijo Conception, M.D.   On: 07/05/2017 19:40   Dg Chest 2 View  Result Date: 06/12/2017 CLINICAL DATA:  Acute onset of seizure. EXAM: CHEST - 2 VIEW COMPARISON:  Chest radiograph performed 05/29/2017 FINDINGS: The lungs are well-aerated and clear. There is no evidence of focal opacification, pleural effusion or pneumothorax. The heart is normal in size; the mediastinal contour is within normal limits. No acute osseous abnormalities are seen. IMPRESSION: No acute cardiopulmonary process seen. Electronically Signed   By: Garald Balding M.D.   On: 06/12/2017 23:11   Dg Elbow Complete Right  Result Date: 06/12/2017 CLINICAL DATA:  Status post seizure, with concern for right elbow injury. Initial encounter. EXAM: RIGHT ELBOW - COMPLETE 3+ VIEW COMPARISON:  None. FINDINGS: There is no evidence of fracture or  dislocation. The visualized joint spaces are preserved. No significant joint effusion is identified. The soft tissues are unremarkable in appearance. A peripheral IV catheter is noted at the antecubital fossa. IMPRESSION: No evidence of fracture or dislocation. Electronically Signed   By: Garald Balding M.D.   On: 06/12/2017 23:11   Dg Forearm Right  Result Date: 06/12/2017 CLINICAL DATA:  Status post seizure, with concern for right forearm injury. Initial encounter. EXAM: RIGHT FOREARM - 2 VIEW COMPARISON:  Right hand radiographs performed 09/23/2012 FINDINGS: There is no evidence of fracture or dislocation. The radius and ulna appear intact. The elbow joint is grossly unremarkable. No elbow joint effusion is identified. The carpal rows appear  grossly intact, and demonstrate normal alignment. A peripheral IV catheter is noted at the antecubital fossa. No definite soft tissue abnormalities are characterized on radiograph. IMPRESSION: No evidence of fracture or dislocation. Electronically Signed   By: Garald Balding M.D.   On: 06/12/2017 23:12   Dg Wrist Complete Right  Result Date: 06/12/2017 CLINICAL DATA:  Status post witnessed seizure. Concern for right wrist injury. Initial encounter. EXAM: RIGHT WRIST - COMPLETE 3+ VIEW COMPARISON:  Right hand radiographs performed 09/23/2012 FINDINGS: There is no evidence of fracture or dislocation. The carpal rows are intact, and demonstrate normal alignment. The joint spaces are preserved. No significant soft tissue abnormalities are seen. IMPRESSION: No evidence of fracture or dislocation. Electronically Signed   By: Garald Balding M.D.   On: 06/12/2017 23:13   Ct Head Wo Contrast  Result Date: 06/12/2017 CLINICAL DATA:  Seizure, right eye abrasion, cystic fibrosis EXAM: CT HEAD WITHOUT CONTRAST CT MAXILLOFACIAL WITHOUT CONTRAST TECHNIQUE: Multidetector CT imaging of the head and maxillofacial structures were performed using the standard protocol without  intravenous contrast. Multiplanar CT image reconstructions of the maxillofacial structures were also generated. COMPARISON:  None. FINDINGS: CT HEAD FINDINGS Brain: No evidence of acute infarction, hemorrhage, hydrocephalus, extra-axial collection or mass lesion/mass effect. Vascular: No hyperdense vessel or unexpected calcification. Skull: Normal. Negative for fracture or focal lesion. Other: None. CT MAXILLOFACIAL FINDINGS Osseous: No evidence of maxillofacial fracture. The mandible is intact. The bilateral mandibular condyles are well-seated in the TMJs. Orbits: The bilateral orbits, including the globes and retroconal soft tissues, are within normal limits. Sinuses: Mild partial opacification of the left frontal sinus. Visualized paranasal sinuses and mastoid air cells are otherwise clear. Soft tissues: Negative. IMPRESSION: Normal head CT. No evidence of maxillofacial fracture. Electronically Signed   By: Julian Hy M.D.   On: 06/12/2017 22:46   Dg Knee Complete 4 Views Left  Result Date: 07/05/2017 CLINICAL DATA:  Left knee pain after fall. EXAM: LEFT KNEE - COMPLETE 4+ VIEW COMPARISON:  Radiographs of June 23, 2016. FINDINGS: No evidence of fracture, dislocation, or joint effusion. No evidence of arthropathy or other focal bone abnormality. Soft tissues are unremarkable. IMPRESSION: Normal left knee. Electronically Signed   By: Marijo Conception, M.D.   On: 07/05/2017 20:45   Dg Hand Complete Right  Result Date: 06/12/2017 CLINICAL DATA:  Status post seizure. Concern for right hand injury. Initial encounter. EXAM: RIGHT HAND - COMPLETE 3+ VIEW COMPARISON:  Right hand radiographs performed 09/23/2012 FINDINGS: There is no evidence of fracture or dislocation. The joint spaces are preserved. The carpal rows are intact, and demonstrate normal alignment. The soft tissues are unremarkable in appearance. IMPRESSION: No evidence of fracture or dislocation. Electronically Signed   By: Garald Balding M.D.    On: 06/12/2017 23:13   Ct Maxillofacial Wo Contrast  Result Date: 06/12/2017 CLINICAL DATA:  Seizure, right eye abrasion, cystic fibrosis EXAM: CT HEAD WITHOUT CONTRAST CT MAXILLOFACIAL WITHOUT CONTRAST TECHNIQUE: Multidetector CT imaging of the head and maxillofacial structures were performed using the standard protocol without intravenous contrast. Multiplanar CT image reconstructions of the maxillofacial structures were also generated. COMPARISON:  None. FINDINGS: CT HEAD FINDINGS Brain: No evidence of acute infarction, hemorrhage, hydrocephalus, extra-axial collection or mass lesion/mass effect. Vascular: No hyperdense vessel or unexpected calcification. Skull: Normal. Negative for fracture or focal lesion. Other: None. CT MAXILLOFACIAL FINDINGS Osseous: No evidence of maxillofacial fracture. The mandible is intact. The bilateral mandibular condyles are well-seated in the TMJs. Orbits: The bilateral orbits,  including the globes and retroconal soft tissues, are within normal limits. Sinuses: Mild partial opacification of the left frontal sinus. Visualized paranasal sinuses and mastoid air cells are otherwise clear. Soft tissues: Negative. IMPRESSION: Normal head CT. No evidence of maxillofacial fracture. Electronically Signed   By: Julian Hy M.D.   On: 06/12/2017 22:46    Microbiology: Recent Results (from the past 240 hour(s))  Blood culture (routine x 2)     Status: None (Preliminary result)   Collection Time: 07/09/17 12:25 PM  Result Value Ref Range Status   Specimen Description BLOOD RIGHT HAND  Final   Special Requests   Final    BOTTLES DRAWN AEROBIC AND ANAEROBIC Blood Culture adequate volume   Culture   Final    NO GROWTH 2 DAYS Performed at [facility], [address], [place], [place] [address]    Report Status PENDING  Incomplete  Urine culture     Status: Abnormal   Collection Time: 07/09/17  8:20 PM  Result Value Ref Range Status   Specimen Description URINE,  CLEAN CATCH  Final   Special Requests   Final    NONE Performed at [facility], [place] [address] [place], [place] [address]    Culture MULTIPLE SPECIES PRESENT, SUGGEST RECOLLECTION (A)  Final   Report Status 07/11/2017 FINAL  Final  Blood culture (routine x 2)     Status: None (Preliminary result)   Collection Time: 07/09/17  9:52 PM  Result Value Ref Range Status   Specimen Description BLOOD RIGHT ANTECUBITAL  Final   Special Requests   Final    BOTTLES DRAWN AEROBIC AND ANAEROBIC Blood Culture adequate volume   Culture   Final    NO GROWTH 1 DAY Performed at [facility], [place] [address], [place], [place] [address]    Report Status PENDING  Incomplete     Labs: Basic Metabolic Panel: Recent Labs  Lab 07/05/17 1844 07/09/17 1005 07/10/17 0409 07/11/17 0459  NA 139 140 139 138  K 3.4* 3.9 3.7 3.2*  CL 105 106 111 108  CO2 24 26 23 24   GLUCOSE 79 90 84 88  BUN <5* 7 8 <5*  CREATININE 0.64 0.61 0.72 0.54  CALCIUM 9.2 9.2 7.8* 8.3*   Liver Function Tests: Recent Labs  Lab 07/09/17 1005 07/11/17 0459  AST 24 36  ALT 21 36  ALKPHOS 43 39  BILITOT 0.7 0.5  PROT 6.9 5.9*  ALBUMIN 4.1 3.4*   Recent Labs  Lab 07/09/17 1005  LIPASE 32   No results for input(s): AMMONIA in the last 168 hours. CBC: Recent Labs  Lab 07/05/17 1844 07/09/17 1005 07/10/17 0409 07/11/17 0459  WBC 15.2* 10.2 9.7 9.1  NEUTROABS  --  6.7  --   --   HGB 11.3* 12.0 9.8* 10.5*  HCT 37.0 38.5 31.9* 33.7*  MCV 93.0 90.2 91.1 89.9  PLT 286 248 194 209   Cardiac Enzymes: Recent Labs  Lab 07/05/17 1844  TROPONINI <0.03   BNP: BNP (last 3 results) No results for input(s): BNP in the last 8760 hours.  ProBNP (last 3 results) No results for input(s): PROBNP in the last 8760 hours.  CBG: No results for input(s): GLUCAP in the last 168 hours.     Signed:  Domenic Polite MD.  [facility] 07/11/2017, 2:48 PM

## 2017-07-14 LAB — CULTURE, BLOOD (ROUTINE X 2)
Culture: NO GROWTH
Special Requests: ADEQUATE

## 2017-07-15 ENCOUNTER — Ambulatory Visit (INDEPENDENT_AMBULATORY_CARE_PROVIDER_SITE_OTHER): Payer: Medicaid Other | Admitting: Family Medicine

## 2017-07-15 ENCOUNTER — Ambulatory Visit (HOSPITAL_COMMUNITY)
Admission: EM | Admit: 2017-07-15 | Discharge: 2017-07-15 | Disposition: A | Discharge status: Home / Self Care | Payer: Medicaid Other | Attending: Family Medicine | Admitting: Family Medicine

## 2017-07-15 ENCOUNTER — Encounter: Payer: Self-pay | Admitting: Family Medicine

## 2017-07-15 ENCOUNTER — Encounter (HOSPITAL_COMMUNITY): Payer: Self-pay | Admitting: Emergency Medicine

## 2017-07-15 ENCOUNTER — Other Ambulatory Visit: Payer: Self-pay

## 2017-07-15 VITALS — BP 160/107 | HR 111 | Ht 66.0 in | Wt 171.7 lb

## 2017-07-15 DIAGNOSIS — G4739 Other sleep apnea: Secondary | ICD-10-CM | POA: Diagnosis not present

## 2017-07-15 DIAGNOSIS — J45909 Unspecified asthma, uncomplicated: Secondary | ICD-10-CM | POA: Diagnosis not present

## 2017-07-15 DIAGNOSIS — R252 Cramp and spasm: Secondary | ICD-10-CM

## 2017-07-15 DIAGNOSIS — R220 Localized swelling, mass and lump, head: Secondary | ICD-10-CM

## 2017-07-15 DIAGNOSIS — J45901 Unspecified asthma with (acute) exacerbation: Secondary | ICD-10-CM | POA: Diagnosis not present

## 2017-07-15 DIAGNOSIS — Z3046 Encounter for surveillance of implantable subdermal contraceptive: Secondary | ICD-10-CM

## 2017-07-15 DIAGNOSIS — R2689 Other abnormalities of gait and mobility: Secondary | ICD-10-CM | POA: Diagnosis not present

## 2017-07-15 DIAGNOSIS — N83201 Unspecified ovarian cyst, right side: Secondary | ICD-10-CM

## 2017-07-15 DIAGNOSIS — N83202 Unspecified ovarian cyst, left side: Secondary | ICD-10-CM

## 2017-07-15 LAB — CULTURE, BLOOD (ROUTINE X 2)
Culture: NO GROWTH
Special Requests: ADEQUATE

## 2017-07-15 MED ORDER — NAPROXEN 500 MG PO TABS: 500.0000 mg | ORAL_TABLET | Freq: Two times a day (BID) | ORAL | 2 refills | Status: DC

## 2017-07-15 NOTE — Progress Notes (Signed)
Patient has jaw pain and swelling in left jaw - ? Abscess. Pt to see PCP or go to urgent care.  [place] in 6 weeks to follow cysts. Refill naproxen

## 2017-07-15 NOTE — Discharge Instructions (Signed)
Given left facial swelling, extremely tender to palpation, unable to open mouth/trismus, with fever, tachycardia, go to emergency department for further evaluation.

## 2017-07-15 NOTE — Progress Notes (Signed)
Pt states is still having pain in abdomen.

## 2017-07-15 NOTE — Progress Notes (Signed)
  Nexplanon Removal:  Patient given informed consent for removal of her Implanon, time out was performed.  Signed copy in the chart.  Appropriate time out taken. Implanon site identified.  Area prepped in usual sterile fashon. One cc of 1% lidocaine was used to anesthetize the area at the distal end of the implant. A small stab incision was made right beside the implant on the distal portion.  The implanon rod was grasped using hemostats and removed without difficulty.  There was less than 3 cc blood loss. There were no complications.  A small amount of antibiotic ointment and steri-strips were applied over the small incision.  A pressure bandage was applied to reduce any bruising.  The patient tolerated the procedure well and was given post procedure instructions. 

## 2017-07-15 NOTE — ED Provider Notes (Signed)
[facility]    CSN: [id number] Arrival date & time: 07/15/17  1718     History   Chief Complaint Chief Complaint  Patient presents with  . Facial Swelling    left    HPI Ange Puskas is a 27 y.o. female.   27 year old female with history of cystic fibrosis, epilepsy, pancreatitis, sickle cell trait comes in for 2-day history of left jaw swelling and pain.  She was discharged from the emergency department 4 days ago due to nausea, vomiting, ovarian cyst, and seizures as she was not able to keep her medications down.  She was seen at the OB/GYN earlier today for Nexplanon removal, and was told to come here for evaluation of left jaw swelling.  States jaw, neck is painful to palpation.  She has not been able to eat or drink as she cannot open her mouth.  She has not been able to take her seizure medicines today due to current symptoms.  She denies any fevers but has felt cold.  No obvious dental pain.  No changes in voice.  Denies shortness of breath, drooling, tripoding.     Past Medical History:  Diagnosis Date  . Asthma   . Complication of anesthesia    "I wake up during anesthesia" (10/14/2015)  . Cystic fibrosis ([place])    O2 dependent-  very uncertain diagnosis!  Marland Kitchen DVT (deep vein thrombosis) in pregnancy ([place])   . Epilepsy ([place])   . GERD (gastroesophageal reflux disease)   . Heart murmur    last work up age 69- no symtoms  . Pancreatitis   . Sickle cell trait ([place])   . Sleep apnea    "used to wear CPAP; don't have one here in [place] since I moved in 2014" (10/14/2015)    Patient Active Problem List   Diagnosis Date Noted  . Nausea with hives 07/09/2017  . History of DVT (deep vein thrombosis) in pregnancy ([place]) 05/29/2017  . Sickle cell trait ([place]) 05/29/2017  . Acute asthma exacerbation 05/29/2017  . Chronic pancreatitis ([place]) 05/29/2017  . Seizures ([place]) 05/29/2017  . Tobacco abuse 05/29/2017  . Acute on chronic respiratory failure with hypoxemia ([place])  05/29/2017  . SOB (shortness of breath) 04/08/2017  . Cough 04/07/2017  . Cystic fibrosis ([place]) 03/10/2017  . Acute on chronic respiratory failure with hypoxia ([place]) 03/10/2017  . Colitis 11/12/2016  . Seizure ([place]) 11/12/2016  . Calculus of bile duct with acute cholecystitis with obstruction 10/14/2015  . Adjustment disorder with mixed anxiety and depressed mood 04/26/2015  . Low blood potassium 04/19/2015  . Asthma with acute exacerbation 04/19/2015  . Asthma 01/03/2015  . Asthma, chronic, unspecified asthma severity, with acute exacerbation 01/03/2015  . Influenza-like illness 01/03/2015  . Status post cesarean section 11/08/2014  . Previous cesarean section complicating pregnancy, antepartum condition or complication   . GERD (gastroesophageal reflux disease)   . Gastroesophageal reflux disease without esophagitis   . Abnormal biochemical finding on antenatal screening of mother   . Abnormal quad screen   . Echogenic focus of heart of fetus affecting antepartum care of mother   . Drug use affecting pregnancy, antepartum 05/19/2014  . Seizure disorder during pregnancy, antepartum ([place]) 05/13/2014  . Supervision of high risk pregnancy, antepartum 05/13/2014  . Asthma complicating pregnancy, antepartum 05/13/2014  . History of food anaphylaxis 05/13/2014  . Seizure disorder, sept 2015 last seizure 01/19/2013    Past Surgical History:  Procedure Laterality Date  . APPENDECTOMY    .  CESAREAN SECTION  2013  . CESAREAN SECTION N/A 11/08/2014   Procedure: CESAREAN SECTION;  Surgeon: Truett Mainland, DO;  Location: [facility];  Service: Obstetrics;  Laterality: N/A;  . CHOLECYSTECTOMY N/A 10/16/2015   Procedure: LAPAROSCOPIC CHOLECYSTECTOMY WITH POSSIBLE INTRAOPERATIVE CHOLANGIOGRAM;  Surgeon: Donnie Mesa, MD;  Location: [facility];  Service: General;  Laterality: N/A;  . ESOPHAGOGASTRODUODENOSCOPY (EGD) WITH PROPOFOL N/A 11/15/2016   Procedure: ESOPHAGOGASTRODUODENOSCOPY (EGD) WITH PROPOFOL;   Surgeon: Clarene Essex, MD;  Location: [facility];  Service: Endoscopy;  Laterality: N/A;  . INGUINAL HERNIA REPAIR Bilateral ~ 1996  . NERVE, TENDON AND ARTERY REPAIR Right 09/23/2012   Procedure: I&D and Repair As Necessary/Right Hand and Palm;  Surgeon: Roseanne Kaufman, MD;  Location: [facility];  Service: Orthopedics;  Laterality: Right;  . TONSILLECTOMY      OB History    Gravida  3   Para  3   Term  3   Preterm      AB      Living  3     SAB      TAB      Ectopic      Multiple  0   Live Births  3            Home Medications    Prior to Admission medications   Medication Sig Start Date End Date Taking? Authorizing Provider  acetaminophen (TYLENOL) 325 MG tablet Take 325 mg by mouth every 6 (six) hours as needed for moderate pain.   Yes [provider]  albuterol (PROVENTIL HFA;VENTOLIN HFA) 108 (90 Base) MCG/ACT inhaler Inhale 2 puffs into the lungs every 4 (four) hours as needed for wheezing or shortness of breath. 01/06/17  Yes Robbie Lis, MD  Alum & Mag Hydroxide-Simeth (GI COCKTAIL) SUSP suspension Take 30 mLs by mouth 2 (two) times daily as needed for indigestion. Shake well.   Yes [provider]  Amylase-Lipase-Protease (CREON 20 PO) Take 2-7 capsules by mouth See admin instructions. Take 2-7 capsules by mouth with each meal and snack (dose based on amount of food intake)   Yes [provider]  Ascorbic Acid (VITAMIN C PO) Take 1 tablet by mouth daily.   Yes [provider]  Aztreonam Lysine 75 MG SOLR Take 75 mg by nebulization 3 (three) times daily.   Yes [provider]  Docusate Sodium (COLACE PO) Take 30 mLs by mouth daily.   Yes [provider]  dornase alpha (PULMOZYME) 1 MG/ML nebulizer solution Take 2.5 mg by nebulization 2 (two) times daily.    Yes [provider]  fluticasone (FLONASE) 50 MCG/ACT nasal spray Place 1 spray into both nostrils daily.   Yes [provider]    Fluticasone-Salmeterol (ADVAIR) 500-50 MCG/DOSE AEPB Inhale 1 puff into the lungs 2 (two) times daily.   Yes [provider]  HYDROcodone-acetaminophen (NORCO/VICODIN) 5-325 MG tablet Take 1 tablet by mouth every 6 (six) hours as needed for moderate pain (pain). 07/11/17  Yes Domenic Polite, MD  levETIRAcetam (KEPPRA) 500 MG tablet Take 1 tablet (500 mg total) by mouth 2 (two) times daily. 07/11/17  Yes Domenic Polite, MD  LUMACAFTOR-IVACAFTOR PO Take by nebulization 2 (two) times daily.   Yes [provider]  montelukast (SINGULAIR) 10 MG tablet Take 10 mg by mouth at bedtime.   Yes [provider]  Multiple Vitamin (MULTIVITAMIN WITH MINERALS) TABS tablet Take 2 tablets by mouth daily.   Yes [provider]  naproxen (NAPROSYN) 500  MG tablet Take 1 tablet (500 mg total) by mouth 2 (two) times daily with a meal. Take with meals for next 2-3days 07/15/17  Yes Truett Mainland, DO  tobramycin, PF, (TOBI) 300 MG/5ML nebulizer solution Take 300 mg by nebulization every 12 (twelve) hours.   Yes [provider]    Family History Family History  Problem Relation Age of Onset  . Cancer Mother        cervical cancer  . Asthma Mother   . Hypertension Father   . Sickle cell anemia Father   . Asthma Sister   . Diabetes Maternal Aunt   . Cancer Maternal Grandmother     Social History Social History   Tobacco Use  . Smoking status: Former Smoker    Packs/day: 0.25    Years: 4.00    Pack years: 1.00    Types: Cigarettes    Last attempt to quit: 10/02/2015    Years since quitting: 1.7  . Smokeless tobacco: Never Used  Substance Use Topics  . Alcohol use: No    Alcohol/week: 0.0 oz    Frequency: Never  . Drug use: No     Allergies   Cheese; Chocolate; Ibuprofen; Ivp dye [iodinated diagnostic agents]; Latex; Orange juice [orange oil]; Other; Peach [prunus persica]; Peanuts [peanut oil]; Pear; Prednisone; Raspberry; Tylenol [acetaminophen];  Amoxicillin; Apricot flavor; Doxycycline; Erythromycin; Milk of magnesia [magnesium hydroxide]; and Penicillins   Review of Systems Review of Systems  Reason unable to perform ROS: See HPI as above.     Physical Exam Triage Vital Signs ED Triage Vitals  Enc Vitals Group     BP --      Pulse Rate 07/15/17 1734 (!) 125     Resp --      Temp 07/15/17 1734 (!) 100.4 F (38 C)     Temp Source 07/15/17 1734 Oral     SpO2 07/15/17 1734 98 %     Weight --      Height --      Head Circumference --      Peak Flow --      Pain Score 07/15/17 1732 10     Pain Loc --      Pain Edu? --      Excl. in [place]? --    No data found.  Updated Vital Signs Pulse (!) 125   Temp (!) 100.4 F (38 C) (Oral)   LMP 06/26/2017 (Exact Date)   SpO2 98%   Physical Exam  Constitutional: She appears well-developed and well-nourished. No distress.  HENT:  Obvious swelling to the left lower jaw.  Patient tender to light touch around left jaw, submental/submandibular region, neck.  She is unable to open her mouth for further evaluation.  She is able to speak full sentences without any distress.  Skin: She is not diaphoretic.     UC Treatments / Results  Labs (all labs ordered are listed, but only abnormal results are displayed) Labs Reviewed - No data to display  EKG None  Radiology No results found.  Procedures Procedures (including critical care time)  Medications Ordered in UC Medications - No data to display  Initial Impression / Assessment and Plan / UC Course  I have reviewed the triage vital signs and the nursing notes.  Pertinent labs & imaging results that were available during my care of the patient were reviewed by me and considered in my medical decision making (see chart for details).    Exam limited due  to trismus.  Patient tachycardic on exam with low-grade fever of 100.4.  She obvious swelling to the left lower jaw.  Patient has not been able to take her seizure  medications due to trismus.  States this happened 4 to 5 days ago, and had to be admitted due to multiple seizures without her medications. Given history and exam, will send patient to emergency department in stable condition for further evaluation and management needed.  Final Clinical Impressions(s) / UC Diagnoses   Final diagnoses:  Trismus  Facial swelling    ED Prescriptions    None        Ok Edwards, PA-C 07/15/17 1805

## 2017-07-15 NOTE — ED Triage Notes (Signed)
Pt reports pain in her left jaw and facial swelling that began two days ago.

## 2017-07-16 ENCOUNTER — Encounter (HOSPITAL_COMMUNITY): Payer: Self-pay | Admitting: Emergency Medicine

## 2017-07-16 ENCOUNTER — Inpatient Hospital Stay (HOSPITAL_COMMUNITY)
Admission: EM | Admit: 2017-07-16 | Discharge: 2017-07-23 | DRG: 133 | Disposition: A | Discharge status: Home / Self Care | Payer: Medicaid Other | Attending: Internal Medicine | Admitting: Internal Medicine

## 2017-07-16 ENCOUNTER — Other Ambulatory Visit: Payer: Self-pay

## 2017-07-16 DIAGNOSIS — Z791 Long term (current) use of non-steroidal anti-inflammatories (NSAID): Secondary | ICD-10-CM

## 2017-07-16 DIAGNOSIS — T7611XA Adult physical abuse, suspected, initial encounter: Secondary | ICD-10-CM | POA: Diagnosis present

## 2017-07-16 DIAGNOSIS — F419 Anxiety disorder, unspecified: Secondary | ICD-10-CM | POA: Diagnosis present

## 2017-07-16 DIAGNOSIS — Z79891 Long term (current) use of opiate analgesic: Secondary | ICD-10-CM

## 2017-07-16 DIAGNOSIS — Z880 Allergy status to penicillin: Secondary | ICD-10-CM

## 2017-07-16 DIAGNOSIS — F329 Major depressive disorder, single episode, unspecified: Secondary | ICD-10-CM | POA: Diagnosis present

## 2017-07-16 DIAGNOSIS — R509 Fever, unspecified: Secondary | ICD-10-CM | POA: Diagnosis not present

## 2017-07-16 DIAGNOSIS — S0083XA Contusion of other part of head, initial encounter: Secondary | ICD-10-CM | POA: Diagnosis present

## 2017-07-16 DIAGNOSIS — J45909 Unspecified asthma, uncomplicated: Secondary | ICD-10-CM | POA: Diagnosis present

## 2017-07-16 DIAGNOSIS — Z91041 Radiographic dye allergy status: Secondary | ICD-10-CM

## 2017-07-16 DIAGNOSIS — W19XXXA Unspecified fall, initial encounter: Secondary | ICD-10-CM | POA: Diagnosis present

## 2017-07-16 DIAGNOSIS — K029 Dental caries, unspecified: Secondary | ICD-10-CM | POA: Diagnosis present

## 2017-07-16 DIAGNOSIS — Z91040 Latex allergy status: Secondary | ICD-10-CM

## 2017-07-16 DIAGNOSIS — R030 Elevated blood-pressure reading, without diagnosis of hypertension: Secondary | ICD-10-CM | POA: Diagnosis present

## 2017-07-16 DIAGNOSIS — D573 Sickle-cell trait: Secondary | ICD-10-CM | POA: Diagnosis present

## 2017-07-16 DIAGNOSIS — K047 Periapical abscess without sinus: Secondary | ICD-10-CM | POA: Diagnosis present

## 2017-07-16 DIAGNOSIS — E876 Hypokalemia: Secondary | ICD-10-CM | POA: Diagnosis not present

## 2017-07-16 DIAGNOSIS — S0301XA Dislocation of jaw, right side, initial encounter: Secondary | ICD-10-CM | POA: Diagnosis not present

## 2017-07-16 DIAGNOSIS — G4089 Other seizures: Secondary | ICD-10-CM | POA: Diagnosis present

## 2017-07-16 DIAGNOSIS — Z886 Allergy status to analgesic agent status: Secondary | ICD-10-CM

## 2017-07-16 DIAGNOSIS — K219 Gastro-esophageal reflux disease without esophagitis: Secondary | ICD-10-CM | POA: Diagnosis present

## 2017-07-16 DIAGNOSIS — R1310 Dysphagia, unspecified: Secondary | ICD-10-CM | POA: Diagnosis not present

## 2017-07-16 DIAGNOSIS — R569 Unspecified convulsions: Secondary | ICD-10-CM

## 2017-07-16 DIAGNOSIS — G40909 Epilepsy, unspecified, not intractable, without status epilepticus: Secondary | ICD-10-CM | POA: Diagnosis not present

## 2017-07-16 DIAGNOSIS — Z79899 Other long term (current) drug therapy: Secondary | ICD-10-CM

## 2017-07-16 DIAGNOSIS — M272 Inflammatory conditions of jaws: Secondary | ICD-10-CM

## 2017-07-16 DIAGNOSIS — Z888 Allergy status to other drugs, medicaments and biological substances status: Secondary | ICD-10-CM

## 2017-07-16 DIAGNOSIS — Z86718 Personal history of other venous thrombosis and embolism: Secondary | ICD-10-CM

## 2017-07-16 DIAGNOSIS — Z87891 Personal history of nicotine dependence: Secondary | ICD-10-CM

## 2017-07-16 DIAGNOSIS — Z881 Allergy status to other antibiotic agents status: Secondary | ICD-10-CM

## 2017-07-16 DIAGNOSIS — D72829 Elevated white blood cell count, unspecified: Secondary | ICD-10-CM | POA: Diagnosis present

## 2017-07-16 DIAGNOSIS — Z7951 Long term (current) use of inhaled steroids: Secondary | ICD-10-CM

## 2017-07-16 HISTORY — DX: Dependence on supplemental oxygen: Z99.81

## 2017-07-16 MED ORDER — LEVETIRACETAM IN NACL 1000 MG/100ML IV SOLN
1000.0000 mg | Freq: Once | INTRAVENOUS | Status: AC
  Administered 2017-07-16: 1000 mg via INTRAVENOUS
  Filled 2017-07-16: qty 100

## 2017-07-16 MED ORDER — MORPHINE SULFATE (PF) 4 MG/ML IV SOLN
4.0000 mg | Freq: Once | INTRAVENOUS | Status: AC
Start: 2017-07-16 — End: 2017-07-16
  Administered 2017-07-16: 4 mg via INTRAVENOUS
  Filled 2017-07-16: qty 1

## 2017-07-16 NOTE — ED Notes (Signed)
Patient transported to CT 

## 2017-07-16 NOTE — ED Triage Notes (Signed)
Per EMS, pt coming from home with complaints of a grand mal like seizure. Pt has history of chronic seizures. Pt had one seizure at home witnessed by family and one seizure on EMS ride both lasting 30 to 45 seconds. Pt received 2.72ml versed via EMS. Pt currently in post ictal state and confused. Pt does complain of left sided jaw pain. EMS denies pt falling at home.
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
## 2017-07-16 NOTE — ED Notes (Signed)
ED Provider at bedside. 

## 2017-07-17 ENCOUNTER — Inpatient Hospital Stay (HOSPITAL_COMMUNITY): Payer: Medicaid Other | Admitting: Anesthesiology

## 2017-07-17 ENCOUNTER — Emergency Department (HOSPITAL_COMMUNITY): Payer: Medicaid Other

## 2017-07-17 ENCOUNTER — Observation Stay (HOSPITAL_COMMUNITY): Payer: Medicaid Other

## 2017-07-17 ENCOUNTER — Encounter (HOSPITAL_COMMUNITY)
Admission: EM | Disposition: A | Discharge status: Home / Self Care | Payer: Self-pay | Source: Home / Self Care | Attending: Internal Medicine

## 2017-07-17 ENCOUNTER — Encounter (HOSPITAL_COMMUNITY): Payer: Self-pay | Admitting: Internal Medicine

## 2017-07-17 DIAGNOSIS — G4089 Other seizures: Secondary | ICD-10-CM | POA: Diagnosis not present

## 2017-07-17 DIAGNOSIS — R509 Fever, unspecified: Secondary | ICD-10-CM | POA: Diagnosis not present

## 2017-07-17 DIAGNOSIS — J45909 Unspecified asthma, uncomplicated: Secondary | ICD-10-CM | POA: Diagnosis not present

## 2017-07-17 DIAGNOSIS — Z886 Allergy status to analgesic agent status: Secondary | ICD-10-CM | POA: Diagnosis not present

## 2017-07-17 DIAGNOSIS — Z87891 Personal history of nicotine dependence: Secondary | ICD-10-CM | POA: Diagnosis not present

## 2017-07-17 DIAGNOSIS — R569 Unspecified convulsions: Secondary | ICD-10-CM | POA: Diagnosis not present

## 2017-07-17 DIAGNOSIS — T7611XA Adult physical abuse, suspected, initial encounter: Secondary | ICD-10-CM | POA: Diagnosis not present

## 2017-07-17 DIAGNOSIS — Z880 Allergy status to penicillin: Secondary | ICD-10-CM | POA: Diagnosis not present

## 2017-07-17 DIAGNOSIS — R1310 Dysphagia, unspecified: Secondary | ICD-10-CM | POA: Diagnosis not present

## 2017-07-17 DIAGNOSIS — Z881 Allergy status to other antibiotic agents status: Secondary | ICD-10-CM | POA: Diagnosis not present

## 2017-07-17 DIAGNOSIS — K047 Periapical abscess without sinus: Secondary | ICD-10-CM | POA: Diagnosis present

## 2017-07-17 DIAGNOSIS — Z888 Allergy status to other drugs, medicaments and biological substances status: Secondary | ICD-10-CM | POA: Diagnosis not present

## 2017-07-17 DIAGNOSIS — E876 Hypokalemia: Secondary | ICD-10-CM | POA: Diagnosis not present

## 2017-07-17 DIAGNOSIS — S0301XA Dislocation of jaw, right side, initial encounter: Secondary | ICD-10-CM | POA: Diagnosis not present

## 2017-07-17 DIAGNOSIS — Z91040 Latex allergy status: Secondary | ICD-10-CM | POA: Diagnosis not present

## 2017-07-17 DIAGNOSIS — D573 Sickle-cell trait: Secondary | ICD-10-CM | POA: Diagnosis not present

## 2017-07-17 DIAGNOSIS — M2669 Other specified disorders of temporomandibular joint: Secondary | ICD-10-CM | POA: Diagnosis not present

## 2017-07-17 DIAGNOSIS — Z791 Long term (current) use of non-steroidal anti-inflammatories (NSAID): Secondary | ICD-10-CM | POA: Diagnosis not present

## 2017-07-17 DIAGNOSIS — Z79899 Other long term (current) drug therapy: Secondary | ICD-10-CM | POA: Diagnosis not present

## 2017-07-17 DIAGNOSIS — S0083XA Contusion of other part of head, initial encounter: Secondary | ICD-10-CM

## 2017-07-17 DIAGNOSIS — G40909 Epilepsy, unspecified, not intractable, without status epilepticus: Secondary | ICD-10-CM | POA: Diagnosis not present

## 2017-07-17 DIAGNOSIS — K219 Gastro-esophageal reflux disease without esophagitis: Secondary | ICD-10-CM | POA: Diagnosis not present

## 2017-07-17 DIAGNOSIS — Z79891 Long term (current) use of opiate analgesic: Secondary | ICD-10-CM | POA: Diagnosis not present

## 2017-07-17 DIAGNOSIS — Z91041 Radiographic dye allergy status: Secondary | ICD-10-CM | POA: Diagnosis not present

## 2017-07-17 DIAGNOSIS — R030 Elevated blood-pressure reading, without diagnosis of hypertension: Secondary | ICD-10-CM | POA: Diagnosis present

## 2017-07-17 DIAGNOSIS — D72829 Elevated white blood cell count, unspecified: Secondary | ICD-10-CM | POA: Diagnosis not present

## 2017-07-17 DIAGNOSIS — W19XXXA Unspecified fall, initial encounter: Secondary | ICD-10-CM | POA: Diagnosis present

## 2017-07-17 DIAGNOSIS — Z86718 Personal history of other venous thrombosis and embolism: Secondary | ICD-10-CM | POA: Diagnosis not present

## 2017-07-17 DIAGNOSIS — F329 Major depressive disorder, single episode, unspecified: Secondary | ICD-10-CM | POA: Diagnosis present

## 2017-07-17 DIAGNOSIS — Z7951 Long term (current) use of inhaled steroids: Secondary | ICD-10-CM | POA: Diagnosis not present

## 2017-07-17 DIAGNOSIS — J9621 Acute and chronic respiratory failure with hypoxia: Secondary | ICD-10-CM | POA: Diagnosis not present

## 2017-07-17 DIAGNOSIS — M272 Inflammatory conditions of jaws: Secondary | ICD-10-CM | POA: Diagnosis not present

## 2017-07-17 HISTORY — PX: CLOSED REDUCTION MANDIBLE WITH MANDIBULOMA: SHX5313

## 2017-07-17 LAB — CBC WITH DIFFERENTIAL/PLATELET
Abs Immature Granulocytes: 0.1 10*3/uL (ref 0.0–0.1)
BASOS ABS: 0.1 10*3/uL (ref 0.0–0.1)
Basophils Relative: 0 %
Eosinophils Absolute: 0.2 10*3/uL (ref 0.0–0.7)
Eosinophils Relative: 1 %
HCT: 40.3 % (ref 36.0–46.0)
HEMOGLOBIN: 12.9 g/dL (ref 12.0–15.0)
IMMATURE GRANULOCYTES: 0 %
LYMPHS PCT: 12 %
Lymphs Abs: 2.2 10*3/uL (ref 0.7–4.0)
MCH: 27.8 pg (ref 26.0–34.0)
MCHC: 32 g/dL (ref 30.0–36.0)
MCV: 86.9 fL (ref 78.0–100.0)
Monocytes Absolute: 1 10*3/uL (ref 0.1–1.0)
Monocytes Relative: 6 %
NEUTROS PCT: 81 %
Neutro Abs: 14.2 10*3/uL — ABNORMAL HIGH (ref 1.7–7.7)
Platelets: 302 10*3/uL (ref 150–400)
RBC: 4.64 MIL/uL (ref 3.87–5.11)
RDW: 12.8 % (ref 11.5–15.5)
WBC: 17.7 10*3/uL — AB (ref 4.0–10.5)

## 2017-07-17 LAB — BASIC METABOLIC PANEL
ANION GAP: 17 — AB (ref 5–15)
BUN: 8 mg/dL (ref 6–20)
CALCIUM: 9.3 mg/dL (ref 8.9–10.3)
CHLORIDE: 100 mmol/L (ref 98–111)
CO2: 19 mmol/L — ABNORMAL LOW (ref 22–32)
CREATININE: 0.89 mg/dL (ref 0.44–1.00)
GFR calc non Af Amer: >60 mL/min (ref >60)
Glucose, Bld: 99 mg/dL (ref 70–99)
Potassium: 3.3 mmol/L — ABNORMAL LOW (ref 3.5–5.1)
SODIUM: 136 mmol/L (ref 135–145)

## 2017-07-17 LAB — GLUCOSE, CAPILLARY: Glucose-Capillary: 71 mg/dL (ref 70–99)

## 2017-07-17 LAB — MRSA PCR SCREENING: MRSA by PCR: NEGATIVE

## 2017-07-17 LAB — PREGNANCY, URINE: PREG TEST UR: NEGATIVE

## 2017-07-17 SURGERY — CLOSED REDUCTION, MANDIBLE, WITH ARCH BAR APPLICATION AND INTERMAXILLARY FIXATION
Anesthesia: General | Site: Mouth

## 2017-07-17 MED ORDER — ONDANSETRON HCL 4 MG/2ML IJ SOLN
INTRAMUSCULAR | Status: DC | PRN
  Administered 2017-07-17: 4 mg via INTRAVENOUS

## 2017-07-17 MED ORDER — 0.9 % SODIUM CHLORIDE (POUR BTL) OPTIME
TOPICAL | Status: DC | PRN
  Administered 2017-07-17: 1000 mL

## 2017-07-17 MED ORDER — CLINDAMYCIN PHOSPHATE 900 MG/50ML IV SOLN
INTRAVENOUS | Status: DC | PRN
  Administered 2017-07-17: 900 mg via INTRAVENOUS

## 2017-07-17 MED ORDER — ONDANSETRON HCL 4 MG/2ML IJ SOLN
INTRAMUSCULAR | Status: AC
  Filled 2017-07-17: qty 2

## 2017-07-17 MED ORDER — KETAMINE HCL 10 MG/ML IJ SOLN
INTRAMUSCULAR | Status: AC | PRN
  Administered 2017-07-17 (×2): 25 mg via INTRAVENOUS

## 2017-07-17 MED ORDER — LIDOCAINE-EPINEPHRINE 2 %-1:100000 IJ SOLN
INTRAMUSCULAR | Status: DC | PRN
  Administered 2017-07-17: 20 mL via INTRADERMAL

## 2017-07-17 MED ORDER — MOMETASONE FURO-FORMOTEROL FUM 200-5 MCG/ACT IN AERO
2.0000 | INHALATION_SPRAY | Freq: Two times a day (BID) | RESPIRATORY_TRACT | Status: DC
  Administered 2017-07-22: 2 via RESPIRATORY_TRACT
  Filled 2017-07-17: qty 8.8

## 2017-07-17 MED ORDER — CLINDAMYCIN PHOSPHATE 900 MG/50ML IV SOLN
INTRAVENOUS | Status: AC
  Filled 2017-07-17: qty 50

## 2017-07-17 MED ORDER — PROPOFOL 10 MG/ML IV BOLUS
INTRAVENOUS | Status: DC | PRN
  Administered 2017-07-17: 100 mg via INTRAVENOUS
  Administered 2017-07-17: 200 mg via INTRAVENOUS

## 2017-07-17 MED ORDER — KETAMINE HCL 10 MG/ML IJ SOLN
1.0000 mg/kg | Freq: Once | INTRAMUSCULAR | Status: DC
  Filled 2017-07-17: qty 1

## 2017-07-17 MED ORDER — HYDROMORPHONE HCL 1 MG/ML IJ SOLN
1.0000 mg | INTRAMUSCULAR | Status: DC | PRN
  Administered 2017-07-17 – 2017-07-22 (×29): 1 mg via INTRAVENOUS
  Filled 2017-07-17 (×29): qty 1

## 2017-07-17 MED ORDER — TRAMADOL HCL 50 MG PO TABS
50.0000 mg | ORAL_TABLET | Freq: Once | ORAL | Status: DC
  Filled 2017-07-17: qty 1

## 2017-07-17 MED ORDER — SUCCINYLCHOLINE CHLORIDE 200 MG/10ML IV SOSY
PREFILLED_SYRINGE | INTRAVENOUS | Status: AC
  Filled 2017-07-17: qty 10

## 2017-07-17 MED ORDER — DOCUSATE SODIUM 100 MG PO CAPS
100.0000 mg | ORAL_CAPSULE | Freq: Two times a day (BID) | ORAL | Status: DC
  Filled 2017-07-17 (×3): qty 1

## 2017-07-17 MED ORDER — OXYMETAZOLINE HCL 0.05 % NA SOLN
NASAL | Status: AC
  Filled 2017-07-17: qty 15

## 2017-07-17 MED ORDER — FLUTICASONE PROPIONATE 50 MCG/ACT NA SUSP
1.0000 | Freq: Every day | NASAL | Status: DC
  Administered 2017-07-19 – 2017-07-22 (×2): 1 via NASAL
  Filled 2017-07-17: qty 16

## 2017-07-17 MED ORDER — LORAZEPAM 2 MG/ML IJ SOLN
1.0000 mg | Freq: Once | INTRAMUSCULAR | Status: AC
  Administered 2017-07-17: 1 mg via INTRAVENOUS
  Filled 2017-07-17: qty 1

## 2017-07-17 MED ORDER — PROPOFOL 10 MG/ML IV BOLUS
1.0000 mg/kg | Freq: Once | INTRAVENOUS | Status: DC
  Filled 2017-07-17: qty 20

## 2017-07-17 MED ORDER — ONDANSETRON HCL 4 MG/2ML IJ SOLN
4.0000 mg | Freq: Four times a day (QID) | INTRAMUSCULAR | Status: DC | PRN
  Administered 2017-07-21 – 2017-07-22 (×2): 4 mg via INTRAVENOUS
  Filled 2017-07-17 (×2): qty 2

## 2017-07-17 MED ORDER — GLYCOPYRROLATE 0.2 MG/ML IJ SOLN
INTRAMUSCULAR | Status: DC | PRN
  Administered 2017-07-17: 0.2 mg via INTRAVENOUS

## 2017-07-17 MED ORDER — LIDOCAINE 2% (20 MG/ML) 5 ML SYRINGE
INTRAMUSCULAR | Status: DC | PRN
  Administered 2017-07-17: 100 mg via INTRAVENOUS

## 2017-07-17 MED ORDER — CEFAZOLIN SODIUM 1 G IJ SOLR
INTRAMUSCULAR | Status: AC
  Filled 2017-07-17: qty 20

## 2017-07-17 MED ORDER — MIDAZOLAM HCL 2 MG/2ML IJ SOLN
INTRAMUSCULAR | Status: AC
  Filled 2017-07-17: qty 2

## 2017-07-17 MED ORDER — DEXAMETHASONE SODIUM PHOSPHATE 10 MG/ML IJ SOLN
INTRAMUSCULAR | Status: DC | PRN
  Administered 2017-07-17: 10 mg via INTRAVENOUS

## 2017-07-17 MED ORDER — SUCCINYLCHOLINE CHLORIDE 200 MG/10ML IV SOSY
PREFILLED_SYRINGE | INTRAVENOUS | Status: DC | PRN
  Administered 2017-07-17: 100 mg via INTRAVENOUS

## 2017-07-17 MED ORDER — SODIUM CHLORIDE 0.9 % IJ SOLN
INTRAMUSCULAR | Status: AC
  Filled 2017-07-17: qty 10

## 2017-07-17 MED ORDER — PROPOFOL 10 MG/ML IV BOLUS
INTRAVENOUS | Status: AC | PRN
  Administered 2017-07-17: 25 mg via INTRAVENOUS

## 2017-07-17 MED ORDER — LEVETIRACETAM IN NACL 500 MG/100ML IV SOLN
500.0000 mg | Freq: Two times a day (BID) | INTRAVENOUS | Status: DC
  Administered 2017-07-17 – 2017-07-23 (×13): 500 mg via INTRAVENOUS
  Filled 2017-07-17 (×15): qty 100

## 2017-07-17 MED ORDER — PROPOFOL 10 MG/ML IV BOLUS
INTRAVENOUS | Status: AC | PRN
  Administered 2017-07-17: 50 mg via INTRAVENOUS
  Administered 2017-07-17: 25 mg via INTRAVENOUS

## 2017-07-17 MED ORDER — LIDOCAINE 2% (20 MG/ML) 5 ML SYRINGE
INTRAMUSCULAR | Status: AC
  Filled 2017-07-17: qty 5

## 2017-07-17 MED ORDER — SUFENTANIL CITRATE 50 MCG/ML IV SOLN
INTRAVENOUS | Status: DC | PRN
  Administered 2017-07-17: 15 ug via INTRAVENOUS
  Administered 2017-07-17: 10 ug via INTRAVENOUS

## 2017-07-17 MED ORDER — LORAZEPAM 2 MG/ML IJ SOLN
1.0000 mg | INTRAMUSCULAR | Status: DC | PRN
  Administered 2017-07-20: 1 mg via INTRAVENOUS
  Filled 2017-07-17: qty 1

## 2017-07-17 MED ORDER — ONDANSETRON HCL 4 MG PO TABS: 4.0000 mg | ORAL_TABLET | Freq: Four times a day (QID) | ORAL | Status: DC | PRN

## 2017-07-17 MED ORDER — LIDOCAINE-EPINEPHRINE 2 %-1:100000 IJ SOLN
INTRAMUSCULAR | Status: AC
  Filled 2017-07-17: qty 1

## 2017-07-17 MED ORDER — MIDAZOLAM HCL 5 MG/5ML IJ SOLN
INTRAMUSCULAR | Status: DC | PRN
  Administered 2017-07-17: 2 mg via INTRAVENOUS

## 2017-07-17 MED ORDER — SUFENTANIL CITRATE 50 MCG/ML IV SOLN
INTRAVENOUS | Status: AC
  Filled 2017-07-17: qty 1

## 2017-07-17 MED ORDER — SODIUM CHLORIDE 0.9 % IV SOLN
INTRAVENOUS | Status: AC
  Administered 2017-07-17 (×2): via INTRAVENOUS

## 2017-07-17 MED ORDER — AZTREONAM LYSINE 75 MG IN SOLR: 75.0000 mg | Freq: Three times a day (TID) | RESPIRATORY_TRACT | Status: DC

## 2017-07-17 MED ORDER — FENTANYL CITRATE (PF) 100 MCG/2ML IJ SOLN
100.0000 ug | Freq: Once | INTRAMUSCULAR | Status: AC
Start: 2017-07-17 — End: 2017-07-17
  Administered 2017-07-17: 100 ug via INTRAVENOUS
  Filled 2017-07-17: qty 2

## 2017-07-17 MED ORDER — MONTELUKAST SODIUM 10 MG PO TABS
10.0000 mg | ORAL_TABLET | Freq: Every day | ORAL | Status: DC
  Filled 2017-07-17: qty 1

## 2017-07-17 MED ORDER — PROPOFOL 10 MG/ML IV BOLUS
INTRAVENOUS | Status: AC
  Filled 2017-07-17: qty 20

## 2017-07-17 MED ORDER — DEXAMETHASONE SODIUM PHOSPHATE 10 MG/ML IJ SOLN
INTRAMUSCULAR | Status: AC
  Filled 2017-07-17: qty 1

## 2017-07-17 MED ORDER — MORPHINE SULFATE (PF) 2 MG/ML IV SOLN
1.0000 mg | Freq: Once | INTRAVENOUS | Status: AC
  Administered 2017-07-17: 1 mg via INTRAVENOUS

## 2017-07-17 MED ORDER — ALBUTEROL SULFATE (2.5 MG/3ML) 0.083% IN NEBU: 3.0000 mL | INHALATION_SOLUTION | RESPIRATORY_TRACT | Status: DC | PRN

## 2017-07-17 MED ORDER — POTASSIUM CHLORIDE 10 MEQ/100ML IV SOLN
10.0000 meq | INTRAVENOUS | Status: AC
  Administered 2017-07-17 (×2): 10 meq via INTRAVENOUS
  Filled 2017-07-17 (×2): qty 100

## 2017-07-17 MED ORDER — GLYCOPYRROLATE PF 0.2 MG/ML IJ SOSY
PREFILLED_SYRINGE | INTRAMUSCULAR | Status: AC
  Filled 2017-07-17: qty 1

## 2017-07-17 MED ORDER — VITAMIN C 500 MG PO TABS
250.0000 mg | ORAL_TABLET | Freq: Every day | ORAL | Status: DC
  Filled 2017-07-17 (×3): qty 1

## 2017-07-17 MED ORDER — DORNASE ALFA 2.5 MG/2.5ML IN SOLN
2.5000 mg | Freq: Two times a day (BID) | RESPIRATORY_TRACT | Status: DC
  Filled 2017-07-17 (×14): qty 2.5

## 2017-07-17 MED ORDER — MORPHINE SULFATE (PF) 2 MG/ML IV SOLN
INTRAVENOUS | Status: AC
  Administered 2017-07-17: 1 mg via INTRAVENOUS
  Filled 2017-07-17: qty 1

## 2017-07-17 MED ORDER — MORPHINE SULFATE (PF) 2 MG/ML IV SOLN
1.0000 mg | INTRAVENOUS | Status: DC | PRN
  Administered 2017-07-17 – 2017-07-19 (×10): 1 mg via INTRAVENOUS
  Filled 2017-07-17 (×10): qty 1

## 2017-07-17 SURGICAL SUPPLY — 50 items
BENZOIN TINCTURE PRP APPL 2/3 (GAUZE/BANDAGES/DRESSINGS) IMPLANT
BLADE 10 SAFETY STRL DISP (BLADE) IMPLANT
BLADE SURG 15 STRL LF DISP TIS (BLADE) ×1 IMPLANT
BLADE SURG 15 STRL SS (BLADE) ×2
BUR CROSS CUT (BURR)
BUR SRG MED 1.6XXCUT FSSR (BURR) IMPLANT
BUR SRG MED 2.1XXCUT FSSR (BURR) IMPLANT
BURR SRG MED 1.6XXCUT FSSR (BURR)
BURR SRG MED 2.1XXCUT FSSR (BURR)
CANISTER SUCT 3000ML PPV (MISCELLANEOUS) ×3 IMPLANT
CLOSURE WOUND 1/2 X4 (GAUZE/BANDAGES/DRESSINGS)
COVER SURGICAL LIGHT HANDLE (MISCELLANEOUS) ×3 IMPLANT
DECANTER SPIKE VIAL GLASS SM (MISCELLANEOUS) IMPLANT
ELECT COATED BLADE 2.86 ST (ELECTRODE) IMPLANT
ELECT NEEDLE BLADE 2-5/6 (NEEDLE) IMPLANT
ELECT REM PT RETURN 9FT ADLT (ELECTROSURGICAL) ×3
ELECTRODE REM PT RTRN 9FT ADLT (ELECTROSURGICAL) ×1 IMPLANT
GAUZE PACKING FOLDED 2  STR (GAUZE/BANDAGES/DRESSINGS) ×2
GAUZE PACKING FOLDED 2 STR (GAUZE/BANDAGES/DRESSINGS) ×1 IMPLANT
GAUZE SPONGE 4X4 16PLY XRAY LF (GAUZE/BANDAGES/DRESSINGS) ×3 IMPLANT
GLOVE BIO SURGEON STRL SZ 6.5 (GLOVE) IMPLANT
GLOVE BIO SURGEON STRL SZ7.5 (GLOVE) IMPLANT
GLOVE BIO SURGEONS STRL SZ 6.5 (GLOVE)
GLOVE SURG SS PI 7.5 STRL IVOR (GLOVE) ×3 IMPLANT
GOWN STRL REUS W/ TWL LRG LVL3 (GOWN DISPOSABLE) ×1 IMPLANT
GOWN STRL REUS W/ TWL XL LVL3 (GOWN DISPOSABLE) ×1 IMPLANT
GOWN STRL REUS W/TWL LRG LVL3 (GOWN DISPOSABLE) ×2
GOWN STRL REUS W/TWL XL LVL3 (GOWN DISPOSABLE) ×2
KIT BASIN OR (CUSTOM PROCEDURE TRAY) ×3 IMPLANT
KIT SPLINT NASAL DENVER LRG BE (GAUZE/BANDAGES/DRESSINGS) IMPLANT
KIT TURNOVER KIT B (KITS) ×3 IMPLANT
NEEDLE 22X1 1/2 (OR ONLY) (NEEDLE) ×3 IMPLANT
NEEDLE BLUNT 16X1.5 OR ONLY (NEEDLE) IMPLANT
NS IRRIG 1000ML POUR BTL (IV SOLUTION) ×3 IMPLANT
PAD ARMBOARD 7.5X6 YLW CONV (MISCELLANEOUS) ×6 IMPLANT
PATTIES SURGICAL .5 X3 (DISPOSABLE) ×3 IMPLANT
PENCIL BUTTON HOLSTER BLD 10FT (ELECTRODE) IMPLANT
SCISSORS WIRE ANG 4 3/4 DISP (INSTRUMENTS) ×3 IMPLANT
STRIP CLOSURE SKIN 1/2X4 (GAUZE/BANDAGES/DRESSINGS) IMPLANT
SUT CHROMIC 3 0 PS 2 (SUTURE) IMPLANT
SUT ETHILON 5 0 P 3 18 (SUTURE)
SUT NYLON ETHILON 5-0 P-3 1X18 (SUTURE) IMPLANT
SUT STEEL 0 (SUTURE) ×2
SUT STEEL 0 18XMFL TIE 17 (SUTURE) ×1 IMPLANT
SUT STEEL 2 (SUTURE) IMPLANT
SYR 50ML SLIP (SYRINGE) IMPLANT
TRAY ENT MC OR (CUSTOM PROCEDURE TRAY) ×3 IMPLANT
TUBING IRRIGATION (MISCELLANEOUS) IMPLANT
WATER STERILE IRR 1000ML POUR (IV SOLUTION) IMPLANT
YANKAUER SUCT BULB TIP NO VENT (SUCTIONS) ×3 IMPLANT

## 2017-07-17 NOTE — Anesthesia Procedure Notes (Signed)
Procedure Name: Intubation Date/Time: 07/17/2017 10:58 PM Performed by: Claris Che, CRNA Pre-anesthesia Checklist: Patient identified, Emergency Drugs available, Suction available, Patient being monitored and Timeout performed Patient Re-evaluated:Patient Re-evaluated prior to induction Oxygen Delivery Method: Circle system utilized Preoxygenation: Pre-oxygenation with 100% oxygen Induction Type: IV induction, Rapid sequence and Cricoid Pressure applied Laryngoscope Size: Mac and 3 Grade View: Grade I Nasal Tubes: Right, Nasal prep performed, Nasal Rae and Magill forceps- large, utilized Tube size: 6.5 mm Number of attempts: 1 Placement Confirmation: ETT inserted through vocal cords under direct vision,  positive ETCO2 and breath sounds checked- equal and bilateral Tube secured with: Tape Dental Injury: Teeth and Oropharynx as per pre-operative assessment
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
## 2017-07-17 NOTE — Progress Notes (Signed)
SLP Cancellation Note  Patient Details Name: Jasmine [name] MRN: [id number] DOB: 04-08-1990   Cancelled treatment:       Reason Eval/Treat Not Completed: Extreme pain limiting ability to participate; pt unable to swallow saliva per RN. Will continue efforts as pt is able.   [name] 07/17/2017, 3:46 PM

## 2017-07-17 NOTE — ED Notes (Signed)
Pt had two back to back grand mal seizures that last around 20 seconds witnessed by this nurse. MD notified.

## 2017-07-17 NOTE — Progress Notes (Signed)
MEDICATION RELATED NOTE   PTA medication, Aztreonam lysine soln 75mg  nebulization TID was ordered on admission. (used for control of gram negative bacteria in the respiratory tract of patients with cystic fibrosis).   [facility] does not stock this medication, it is non-formulary.  We have asked patient to bring in home medication per our policy, however she states that there is no one to bring in her supply. On patient's recent past admission our pharmacy buyer learned we are unable to obtain this medication from our supplier.   I discussed this information with Dr. Zigmund Daniel.  She said to discontinue the Aztreonam nebulizer order.    Nicole Cella, RPh Clinical Pharmacist Please check AMION for all [place] phone numbers After 10:00 PM, call [place] [phone] 07/17/2017,5:31 PM

## 2017-07-17 NOTE — Progress Notes (Signed)
SLP Cancellation Note  Patient Details Name: Jasmine [name] MRN: [id number] DOB: 10-01-1990   Cancelled treatment:       Reason Eval/Treat Not Completed: Other (comment) Pt writhing in bed/crying in pain; nursing tech present and RN is on her way to help with pain management.  Pt unable to participate in swallow assessment at this time - will return when she is more comfortable and pain is controlled.   [name] 07/17/2017, 9:22 AM

## 2017-07-17 NOTE — ED Notes (Signed)
Attempted report x1. 

## 2017-07-17 NOTE — ED Notes (Signed)
Consent signed.

## 2017-07-18 ENCOUNTER — Inpatient Hospital Stay (HOSPITAL_COMMUNITY): Payer: Medicaid Other

## 2017-07-18 ENCOUNTER — Encounter (HOSPITAL_COMMUNITY): Payer: Self-pay | Admitting: Oral Surgery

## 2017-07-18 LAB — BASIC METABOLIC PANEL
ANION GAP: 12 (ref 5–15)
BUN: <5 mg/dL — ABNORMAL LOW (ref 6–20)
CALCIUM: 8.9 mg/dL (ref 8.9–10.3)
CHLORIDE: 102 mmol/L (ref 98–111)
CO2: 22 mmol/L (ref 22–32)
Creatinine, Ser: 0.61 mg/dL (ref 0.44–1.00)
GFR calc non Af Amer: >60 mL/min (ref >60)
Glucose, Bld: 135 mg/dL — ABNORMAL HIGH (ref 70–99)
Potassium: 3 mmol/L — ABNORMAL LOW (ref 3.5–5.1)
SODIUM: 136 mmol/L (ref 135–145)

## 2017-07-18 LAB — GLUCOSE, CAPILLARY
GLUCOSE-CAPILLARY: 110 mg/dL — AB (ref 70–99)
GLUCOSE-CAPILLARY: 129 mg/dL — AB (ref 70–99)

## 2017-07-18 LAB — CBC
HCT: 37.7 % (ref 36.0–46.0)
HEMOGLOBIN: 11.9 g/dL — AB (ref 12.0–15.0)
MCH: 27.7 pg (ref 26.0–34.0)
MCHC: 31.6 g/dL (ref 30.0–36.0)
MCV: 87.9 fL (ref 78.0–100.0)
Platelets: 238 10*3/uL (ref 150–400)
RBC: 4.29 MIL/uL (ref 3.87–5.11)
RDW: 13 % (ref 11.5–15.5)
WBC: 16.3 10*3/uL — AB (ref 4.0–10.5)

## 2017-07-18 MED ORDER — LACTATED RINGERS IV SOLN
INTRAVENOUS | Status: DC | PRN
  Administered 2017-07-17: 23:00:00 via INTRAVENOUS

## 2017-07-18 MED ORDER — MEPERIDINE HCL 50 MG/ML IJ SOLN: 6.2500 mg | INTRAMUSCULAR | Status: DC | PRN

## 2017-07-18 MED ORDER — BOOST / RESOURCE BREEZE PO LIQD CUSTOM
1.0000 | Freq: Three times a day (TID) | ORAL | Status: DC
  Administered 2017-07-18 – 2017-07-22 (×6): 1 via ORAL
  Administered 2017-07-22: 237 mL via ORAL

## 2017-07-18 MED ORDER — LORAZEPAM 2 MG/ML IJ SOLN
0.5000 mg | Freq: Once | INTRAMUSCULAR | Status: AC
  Administered 2017-07-18: 0.5 mg via INTRAVENOUS
  Filled 2017-07-18: qty 1

## 2017-07-18 MED ORDER — POTASSIUM CHLORIDE 20 MEQ/15ML (10%) PO SOLN
40.0000 meq | Freq: Once | ORAL | Status: DC
  Filled 2017-07-18: qty 30

## 2017-07-18 MED ORDER — HYDROMORPHONE HCL 1 MG/ML IJ SOLN
0.5000 mg | Freq: Once | INTRAMUSCULAR | Status: AC
  Administered 2017-07-18: 0.5 mg via INTRAVENOUS
  Filled 2017-07-18: qty 0.5

## 2017-07-18 MED ORDER — FENTANYL CITRATE (PF) 100 MCG/2ML IJ SOLN
INTRAMUSCULAR | Status: AC
  Filled 2017-07-18: qty 2

## 2017-07-18 MED ORDER — POTASSIUM CHLORIDE 10 MEQ/100ML IV SOLN
10.0000 meq | INTRAVENOUS | Status: AC
  Administered 2017-07-18 (×2): 10 meq via INTRAVENOUS
  Filled 2017-07-18 (×2): qty 100

## 2017-07-18 MED ORDER — CLINDAMYCIN PHOSPHATE 600 MG/50ML IV SOLN
600.0000 mg | Freq: Four times a day (QID) | INTRAVENOUS | Status: DC
  Administered 2017-07-18 – 2017-07-23 (×21): 600 mg via INTRAVENOUS
  Filled 2017-07-18 (×25): qty 50

## 2017-07-18 MED ORDER — MIDAZOLAM HCL 2 MG/2ML IJ SOLN: 0.5000 mg | Freq: Once | INTRAMUSCULAR | Status: DC | PRN

## 2017-07-18 MED ORDER — PROMETHAZINE HCL 25 MG/ML IJ SOLN: 6.2500 mg | INTRAMUSCULAR | Status: DC | PRN

## 2017-07-18 MED ORDER — FENTANYL CITRATE (PF) 100 MCG/2ML IJ SOLN
25.0000 ug | INTRAMUSCULAR | Status: DC | PRN
  Administered 2017-07-18: 50 ug via INTRAVENOUS

## 2017-07-18 MED ORDER — TOBRAMYCIN 300 MG/5ML IN NEBU
300.0000 mg | INHALATION_SOLUTION | Freq: Two times a day (BID) | RESPIRATORY_TRACT | Status: DC
  Filled 2017-07-18 (×12): qty 5

## 2017-07-18 MED ORDER — WHITE PETROLATUM EX OINT
TOPICAL_OINTMENT | CUTANEOUS | Status: AC
  Filled 2017-07-18: qty 28.35

## 2017-07-18 NOTE — Transfer of Care (Signed)
Immediate Anesthesia Transfer of Care Note  Patient: Jasmine [name]  Procedure(s) Performed: REDUCTION TMJ WITH INTRAMAXILLARY FIXATION (N/A Mouth)  Patient Location: PACU  Anesthesia Type:General  Level of Consciousness: oriented, sedated, drowsy, patient cooperative and responds to stimulation  Airway & Oxygen Therapy: Patient Spontanous Breathing and Patient connected to nasal cannula oxygen  Post-op Assessment: Report given to RN, Post -op Vital signs reviewed and stable and Patient moving all extremities X 4  Post vital signs: Reviewed and stable  Last Vitals:  Vitals Value Taken Time  BP 150/86 07/18/2017 12:14 AM  Temp 36.5 C 07/18/2017 12:15 AM  Pulse 110 07/18/2017 12:21 AM  Resp 13 07/18/2017 12:21 AM  SpO2 95 % 07/18/2017 12:21 AM  Vitals shown include unvalidated device data.  Last Pain:  Vitals:   07/18/17 0015  TempSrc:   PainSc: 0-No pain      Patients Stated Pain Goal: 3 (26/37/85 8850)  Complications: No apparent anesthesia complications

## 2017-07-18 NOTE — Progress Notes (Signed)
SLP Cancellation Note  Patient Details Name: Jasmine [name] MRN: [id number] DOB: 1990-05-25   Cancelled treatment:       Reason Eval/Treat Not Completed: Pain limiting ability to participate(pt declining to try po due to pain and difficulty swallowing)   Pt given communication board by this SLP to use for convenience.  Will continue efforts.  Luanna Salk, [facility] SLP [place], [place] 07/18/2017, 8:30 AM
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
## 2017-07-18 NOTE — Progress Notes (Signed)
Patient refused Pulmozyme at this time. Patient only wanted pain meds. RN notified. RT will continue to monitor.
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
## 2017-07-18 NOTE — Progress Notes (Signed)
Admission documentation not completed this shift, pt has been to the OR, taking pain and antianxiety medication, and contacting individuals outside the hospital who could pose a safety risk. There was not an appropriate time for completion.
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
## 2017-07-18 NOTE — Op Note (Signed)
Jasmine [name], TINEO MEDICAL RECORD OY:[id number] ACCOUNT [id number] DATE OF BIRTH:09/16/1990 FACILITY: [facility] LOCATION: [facility] PHYSICIAN:Ellin Fitzgibbons M. Tyrihanna Wingert, DDS  OPERATIVE REPORT  DATE OF PROCEDURE:  07/17/2017  ADDENDUM  FINDINGS:  Upon putting the patient asleep, the oral cavity was examined and there were multiple carious teeth noted.  When attempting to place arch bars in the mandible, it was noted that there was foul smelling drainage from the area of tooth 19 and  the tooth had a large carious lesion on it.  Without adequate radiographs, it was impossible to determine if this tooth can be saved at this time.  So the patient was placed on clindamycin postoperatively to help prevent worsening of infection.  TN/NUANCE  D:07/18/2017 T:07/18/2017 JOB:[id number]

## 2017-07-18 NOTE — Op Note (Signed)
Jasmine [name], [place] MEDICAL RECORD ER:[id number] ACCOUNT [id number] DATE OF BIRTH:[birth year] FACILITY: [facility] LOCATION: [facility] PHYSICIAN:Aniela Caniglia M. Ciaira Natividad, DDS  OPERATIVE REPORT  DATE OF PROCEDURE:  07/17/2017  PREOPERATIVE DIAGNOSIS:  Right temporomandibular joint dislocation.  POSTOPERATIVE DIAGNOSIS:  Right temporomandibular joint dislocation.  PROCEDURE:  Closed reduction of temporomandibular joint dislocation, intermaxillary fixation.  SURGEON:  Diona Browner, DDS  ANESTHESIA:  General, nasal intubation.  Annye Asa, attending.  DESCRIPTION OF PROCEDURE:  The patient was taken to the operating room and placed on the table in supine position.  General anesthesia was administered intravenously.  Then a nasal endotracheal tube was placed and secured.  The patient was prepped for  surgery and timeout was performed.  The posterior pharynx was suctioned, a throat pack was placed.  Then 2% lidocaine 1:100,000 epinephrine was infiltrated, then an inferior alveolar block on the right and left sides, and then buccal and palatal  infiltration of the maxilla with anterior buccal infiltration of the mandible.  A total of 20 mL was utilized.  A bite block was placed on the right side of the mouth.  Erich arch bars were applied to the upper and lower jaw using 24- and 26-gauge wires.   When these were complete, the oral cavity was then irrigated and suctioned, and the throat pack was removed.  Then, the jaws were closed together to reduce the TMJ dislocation, and 26-gauge wires were placed intermaxillary x4; 2 on the right side, 2  ____.  The patient was then left in the care of anesthesia for awakening, transported to the recovery room with plans for transportation to the floor.  ESTIMATED BLOOD LOSS:  Minimum.  COMPLICATIONS:  None.  SPECIMENS:  None.  LN/NUANCE  D:07/18/2017 T:07/18/2017 JOB:[id number]
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
## 2017-07-18 NOTE — Op Note (Signed)
07/17/2017  12:01 AM  PATIENT:  Jasmine [name]  28 y.o. female  PRE-OPERATIVE DIAGNOSIS:  R TMJ dislocation  POST-OPERATIVE DIAGNOSIS:  SAME  PROCEDURE:  Procedure(s): CLOSED EDUCTION TMJ DISLOCATION WITH INTERMAXILLARY FIXATION  SURGEON:  Surgeon(s): Diona Browner, DDS  ANESTHESIA:   local and general  EBL:  minimal  DRAINS: none   SPECIMEN:  No Specimen  COUNTS:  YES  PLAN OF CARE: Discharge to home after PACU  PATIENT DISPOSITION:  PACU - hemodynamically stable.   PROCEDURE DETAILS: Dictation # [id number]  Gae Bon, DMD 07/18/2017 12:01 AM
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
## 2017-07-18 NOTE — Anesthesia Postprocedure Evaluation (Signed)
Anesthesia Post Note  Patient: Jasmine [name]  Procedure(s) Performed: REDUCTION TMJ WITH INTRAMAXILLARY FIXATION (N/A Mouth)     Patient location during evaluation: PACU Anesthesia Type: General Level of consciousness: awake and alert, oriented and patient cooperative Pain management: pain level controlled Vital Signs Assessment: post-procedure vital signs reviewed and stable Respiratory status: spontaneous breathing, nonlabored ventilation and respiratory function stable Cardiovascular status: blood pressure returned to baseline and stable Postop Assessment: no apparent nausea or vomiting Anesthetic complications: no    Last Vitals:  Vitals:   07/18/17 0040 07/18/17 0045  BP:  (!) 151/97  Pulse: (!) 106 (!) 103  Resp: 13 17  Temp:  37.3 C  SpO2: 100% 100%    Last Pain:  Vitals:   07/18/17 0030  TempSrc:   PainSc: Asleep                 Iman Reinertsen,E. Gratia Disla
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
## 2017-07-19 ENCOUNTER — Ambulatory Visit (HOSPITAL_COMMUNITY): Payer: Medicaid Other | Attending: Family Medicine

## 2017-07-19 DIAGNOSIS — D72829 Elevated white blood cell count, unspecified: Secondary | ICD-10-CM

## 2017-07-19 LAB — CBC
HCT: 35.8 % — ABNORMAL LOW (ref 36.0–46.0)
Hemoglobin: 11.5 g/dL — ABNORMAL LOW (ref 12.0–15.0)
MCH: 27.7 pg (ref 26.0–34.0)
MCHC: 32.1 g/dL (ref 30.0–36.0)
MCV: 86.3 fL (ref 78.0–100.0)
PLATELETS: 255 10*3/uL (ref 150–400)
RBC: 4.15 MIL/uL (ref 3.87–5.11)
RDW: 12.8 % (ref 11.5–15.5)
WBC: 13.4 10*3/uL — ABNORMAL HIGH (ref 4.0–10.5)

## 2017-07-19 LAB — BASIC METABOLIC PANEL
Anion gap: 10 (ref 5–15)
BUN: <5 mg/dL — ABNORMAL LOW (ref 6–20)
CALCIUM: 8.7 mg/dL — AB (ref 8.9–10.3)
CO2: 25 mmol/L (ref 22–32)
CREATININE: 0.58 mg/dL (ref 0.44–1.00)
Chloride: 101 mmol/L (ref 98–111)
GFR calc Af Amer: >60 mL/min (ref >60)
Glucose, Bld: 102 mg/dL — ABNORMAL HIGH (ref 70–99)
Potassium: 2.6 mmol/L — CL (ref 3.5–5.1)
SODIUM: 136 mmol/L (ref 135–145)

## 2017-07-19 LAB — GLUCOSE, CAPILLARY
GLUCOSE-CAPILLARY: 99 mg/dL (ref 70–99)
GLUCOSE-CAPILLARY: 81 mg/dL (ref 70–99)
Glucose-Capillary: 80 mg/dL (ref 70–99)
Glucose-Capillary: 73 mg/dL (ref 70–99)

## 2017-07-19 MED ORDER — SODIUM CHLORIDE 0.9 % IV SOLN: INTRAVENOUS | Status: DC

## 2017-07-19 MED ORDER — POTASSIUM CHLORIDE IN NACL 40-0.9 MEQ/L-% IV SOLN
INTRAVENOUS | Status: DC
  Administered 2017-07-20: 50 mL/h via INTRAVENOUS
  Filled 2017-07-19: qty 1000

## 2017-07-19 MED ORDER — POTASSIUM CHLORIDE 10 MEQ/100ML IV SOLN
10.0000 meq | INTRAVENOUS | Status: AC
  Administered 2017-07-19: 10 meq via INTRAVENOUS
  Filled 2017-07-19 (×6): qty 100

## 2017-07-19 MED ORDER — OXYCODONE HCL 20 MG/ML PO CONC
5.0000 mg | ORAL | Status: DC | PRN
  Administered 2017-07-19 – 2017-07-23 (×12): 5 mg via ORAL
  Filled 2017-07-19 (×13): qty 1

## 2017-07-19 NOTE — Progress Notes (Signed)
Jasmine [name] PROGRESS NOTE:   SUBJECTIVE: left jaw pain,   OBJECTIVE:  Vitals: Blood pressure 137/73, pulse 62, temperature 97.6 F (36.4 C), temperature source Oral, resp. rate 16, height 5\' 6"  (1.676 m), weight 171 lb (77.6 kg), last menstrual period 06/26/2017, SpO2 100 %, not currently breastfeeding. Lab results: Results for orders placed or performed during the hospital encounter of 07/16/17 (from the past 24 hour(s))  CBC     Status: Abnormal   Collection Time: 07/19/17  4:15 AM  Result Value Ref Range   WBC 13.4 (H) 4.0 - 10.5 K/uL   RBC 4.15 3.87 - 5.11 MIL/uL   Hemoglobin 11.5 (L) 12.0 - 15.0 g/dL   HCT 35.8 (L) 36.0 - 46.0 %   MCV 86.3 78.0 - 100.0 fL   MCH 27.7 26.0 - 34.0 pg   MCHC 32.1 30.0 - 36.0 g/dL   RDW 12.8 11.5 - 15.5 %   Platelets 255 150 - 400 K/uL  Basic metabolic panel     Status: Abnormal   Collection Time: 07/19/17  4:15 AM  Result Value Ref Range   Sodium 136 135 - 145 mmol/L   Potassium 2.6 (LL) 3.5 - 5.1 mmol/L   Chloride 101 98 - 111 mmol/L   CO2 25 22 - 32 mmol/L   Glucose, Bld 102 (H) 70 - 99 mg/dL   BUN <5 (L) 6 - 20 mg/dL   Creatinine, Ser 0.58 0.44 - 1.00 mg/dL   Calcium 8.7 (L) 8.9 - 10.3 mg/dL   GFR calc non Af Amer >60 >60 mL/min   GFR calc Af Amer >60 >60 mL/min   Anion gap 10 5 - 15  Glucose, capillary     Status: None   Collection Time: 07/19/17  6:31 AM  Result Value Ref Range   Glucose-Capillary 99 70 - 99 mg/dL   Comment 1 Notify RN    Comment 2 Document in Chart    Radiology Results: Dg Orthopantogram  Result Date: 07/18/2017 CLINICAL DATA:  Postop fluid reduction for TMJ dislocation. EXAM: ORTHOPANTOGRAM/PANORAMIC COMPARISON:  None. FINDINGS: No evidence of fracture.  Remainder the exam is unremarkable. IMPRESSION: No acute findings. Electronically Signed   By: Marin Olp M.D.   On: 07/18/2017 19:12   General appearance: alert and no distress Head: Normocephalic, without obvious abnormality, atraumatic Eyes:  negative Nose: Nares normal. Septum midline. Mucosa normal. No drainage or sinus tenderness. Throat: Intermaxillary fixation intact. no fluctuance or drainage intraoral left mandible.  Neck: no adenopathy and supple, symmetrical, trachea midline Post-Op Orthopantogram demonstrates   right and left condyle reduced, located in glenoid fossa. Abscess tooth #19  ASSESSMENT: Tolerating PO's and talking better today. Jaw pain better and less anxiety.   PLAN: Will follow in office after discharge to replace wire fixation with elastic bands to allow for increasing jaw opening, then D/C all wires and elastics after 2 - 3 weeks. Will extract tooth # 19 in office after discharge. Please  Contact me if patient needs additional oral surgery care while inpatient. [place] 07/19/2017

## 2017-07-19 NOTE — Progress Notes (Signed)
Pt refused all breathing treatments at this time.

## 2017-07-19 NOTE — Progress Notes (Signed)
Pt wants bath before pain meds.
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
## 2017-07-19 NOTE — Plan of Care (Signed)
Ms. Noyce still has considerable pain in her jaw and anxiety related to her condition/home life.  In addition to jaw pain, she has very sensitive veins that are not easily tolerating potassium chloride infusion--as a result, I have slowed the constant infusion to the point that she can tolerate it and maintained PRN pain medication on schedule.    The patient verbalizes that it is "time to go forward, not backward."  She has been complimentary of the staff here, and is more comfortable.  Social work is involved for home life issues.  Nursing POC:  pain control, facilitating effective communication, electrolyte replacement and encouraging safety both in the hospital and at home.

## 2017-07-19 NOTE — Progress Notes (Signed)
Consulted [employer] and Pharm per compatibility of IV Keppra and IV Potassium.  Not tested, therefore will pause Potassium, flush and start Keppra then flush and resume Potassium.

## 2017-07-19 NOTE — Care Management Note (Signed)
Case Management Note  Patient Details  Name: Jasmine [name] MRN: [id number] Date of Birth: 1990/05/27  Subjective/Objective:    Patient admitted with seizures d/t not being able to take her seizure medications from dislocated jaw. Pt is from home with spouse.                Action/Plan: Plan is for patient to return home when medically ready. CM following.  Expected Discharge Date:                  Expected Discharge Plan:  Home/Self Care  In-House Referral:     Discharge planning Services     Post Acute Care Choice:    Choice offered to:     DME Arranged:    DME Agency:     HH Arranged:    [place] Agency:     Status of Service:  Completed, signed off  If discussed at [employer] of Stay Meetings, dates discussed:    Additional Comments:  Pollie Friar, RN 07/19/2017, 2:51 PM

## 2017-07-19 NOTE — Progress Notes (Signed)
Initial Nutrition Assessment  DOCUMENTATION CODES:   Not applicable  INTERVENTION:   - Continue Boost Breeze po TID, each supplement provides 250 kcal and 9 grams of protein  - Encourage PO intake to promote healing  NUTRITION DIAGNOSIS:   Inadequate oral intake related to mouth pain, other (see comment)(s/p intermaxillary fixation) as evidenced by other (comment), per patient/family report(pt on clear liquid diet).  GOAL:   Patient will meet greater than or equal to 90% of their needs  MONITOR:   PO intake, Diet advancement, Supplement acceptance, Labs, Weight trends  REASON FOR ASSESSMENT:   Malnutrition Screening Tool    ASSESSMENT:   27 year old female who presented to the ED with seizures. PMH significant for cystic fibrosis, seizure disorder, and sickle cell trait. Pt had 2 tonic-clonic seizures in the ED. Pt reports being unable to take her seizure medication due to a TMJ dislocation.  7/4 - closed reduction TMJ dislocation with intermaxillary fixation, pt transitioned to clear liquid diet  Interacted with pt at bedside. Pt currently not talking due to recent surgery but was able to communicate with head nods and hand signals (thumbs up, thumbs down).  Pt is unsure whether she has had weight loss. Per weight history in chart, it appears that pt's weight has increased since July 2018 when pt's weight was recorded as 158 lbs 4 oz and December 108 when pt's weight was recorded as 161 lbs 3 oz. Pt's weight upon admission was 171 lbs.  When asked if pt has a good appetite, pt nods. Pt also nods when asked if she eats 3 meals daily. Pt confirms that she has 3 young children at home and that she is active.  Pt endorses liking Boost Breeze supplements and drinking them mixed with tea. RD to continue at this time.  Medications reviewed and include: 100 mg Colace BID, Boost Breeze TID, 250 mg vitamin C daily, IV antibiotics, 10 mEq KCl x 6 runs  Labs reviewed: potassium 2.6  (L), BUN <5 (L), hemoglobin 11.5 (L), HCT 35.8 (L) CBG's: 99, 129, 110, 71 since admission  NUTRITION - FOCUSED PHYSICAL EXAM:    Most Recent Value  Orbital Region  No depletion  Upper Arm Region  No depletion  Thoracic and Lumbar Region  No depletion  Buccal Region  Unable to assess  Temple Region  No depletion  Clavicle Bone Region  No depletion  Clavicle and Acromion Bone Region  No depletion  Scapular Bone Region  Unable to assess  Dorsal Hand  No depletion  Patellar Region  No depletion  Anterior Thigh Region  Mild depletion  Posterior Calf Region  No depletion  Edema (RD Assessment)  None  Hair  Reviewed  Eyes  Reviewed  Mouth  Unable to assess  Skin  Reviewed  Nails  Reviewed       Diet Order:   Diet Order           Diet clear liquid Room service appropriate? Yes; Fluid consistency: Thin  Diet effective now          EDUCATION NEEDS:   No education needs have been identified at this time  Skin:  Skin Assessment: Reviewed RN Assessment  Last BM:  unknown/PTA  Height:   Ht Readings from Last 1 Encounters:  07/16/17 5\' 6"  (1.676 m)    Weight:   Wt Readings from Last 1 Encounters:  07/16/17 171 lb (77.6 kg)    Ideal Body Weight:  59.09 kg  BMI:  Body mass  index is 27.6 kg/m.  Estimated Nutritional Needs:   Kcal:  2000-2200 kcal/day  Protein:  100-115 grams/day  Fluid:  2.0-2.2 L/day    Gaynell Face, MS, RD, LDN Pager: [phone] Weekend/After Hours: [phone]
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
## 2017-07-20 DIAGNOSIS — M272 Inflammatory conditions of jaws: Secondary | ICD-10-CM

## 2017-07-20 DIAGNOSIS — S0301XA Dislocation of jaw, right side, initial encounter: Principal | ICD-10-CM

## 2017-07-20 DIAGNOSIS — R569 Unspecified convulsions: Secondary | ICD-10-CM

## 2017-07-20 LAB — BASIC METABOLIC PANEL
Anion gap: 12 (ref 5–15)
CALCIUM: 8.7 mg/dL — AB (ref 8.9–10.3)
CHLORIDE: 103 mmol/L (ref 98–111)
CO2: 26 mmol/L (ref 22–32)
CREATININE: 0.48 mg/dL (ref 0.44–1.00)
GFR calc non Af Amer: >60 mL/min (ref >60)
Glucose, Bld: 85 mg/dL (ref 70–99)
Potassium: 2.9 mmol/L — ABNORMAL LOW (ref 3.5–5.1)
SODIUM: 141 mmol/L (ref 135–145)

## 2017-07-20 LAB — CBC
HCT: 32.3 % — ABNORMAL LOW (ref 36.0–46.0)
Hemoglobin: 10.2 g/dL — ABNORMAL LOW (ref 12.0–15.0)
MCH: 27.6 pg (ref 26.0–34.0)
MCHC: 31.6 g/dL (ref 30.0–36.0)
MCV: 87.3 fL (ref 78.0–100.0)
PLATELETS: 237 10*3/uL (ref 150–400)
RBC: 3.7 MIL/uL — ABNORMAL LOW (ref 3.87–5.11)
RDW: 12.9 % (ref 11.5–15.5)
WBC: 8.9 10*3/uL (ref 4.0–10.5)

## 2017-07-20 LAB — GLUCOSE, CAPILLARY
GLUCOSE-CAPILLARY: 83 mg/dL (ref 70–99)
Glucose-Capillary: 81 mg/dL (ref 70–99)
Glucose-Capillary: 74 mg/dL (ref 70–99)

## 2017-07-20 LAB — MAGNESIUM: MAGNESIUM: 1.8 mg/dL (ref 1.7–2.4)

## 2017-07-20 MED ORDER — POTASSIUM CHLORIDE IN NACL 40-0.9 MEQ/L-% IV SOLN
INTRAVENOUS | Status: DC
  Filled 2017-07-20: qty 1000

## 2017-07-20 MED ORDER — POTASSIUM CHLORIDE IN NACL 40-0.9 MEQ/L-% IV SOLN
INTRAVENOUS | Status: AC
  Administered 2017-07-20: 100 mL/h via INTRAVENOUS
  Filled 2017-07-20 (×2): qty 1000

## 2017-07-20 MED ORDER — POTASSIUM CHLORIDE 10 MEQ/100ML IV SOLN
10.0000 meq | INTRAVENOUS | Status: AC
  Administered 2017-07-20: 10 meq via INTRAVENOUS
  Filled 2017-07-20 (×4): qty 100

## 2017-07-20 NOTE — Plan of Care (Signed)
Altogether, Jasmine [name] is in less pain today.  However, she continues to weaken from poor nutrition and bedrest.  I recommend PT/OT evaluation, sitting in the chair and progressive mobility.  She communicates effectively through writing, gestures and nods.    Nursing POC:  Pain and anxiety management, progressive mobility, electrolyte replacement and emotional support.

## 2017-07-20 NOTE — Progress Notes (Signed)
RT note- Patient is refusing all nebulized medications, advised the importance of them, spoke to the RN and is aware.
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
## 2017-07-20 NOTE — Progress Notes (Signed)
Messaged [facility] 2.9

## 2017-07-21 ENCOUNTER — Inpatient Hospital Stay: Payer: Self-pay

## 2017-07-21 LAB — BASIC METABOLIC PANEL
ANION GAP: 5 (ref 5–15)
BUN: <5 mg/dL — ABNORMAL LOW (ref 6–20)
CALCIUM: 8.5 mg/dL — AB (ref 8.9–10.3)
CHLORIDE: 107 mmol/L (ref 98–111)
CO2: 26 mmol/L (ref 22–32)
Creatinine, Ser: 0.48 mg/dL (ref 0.44–1.00)
GFR calc Af Amer: >60 mL/min (ref >60)
GFR calc non Af Amer: >60 mL/min (ref >60)
GLUCOSE: 85 mg/dL (ref 70–99)
POTASSIUM: 3 mmol/L — AB (ref 3.5–5.1)
Sodium: 138 mmol/L (ref 135–145)

## 2017-07-21 LAB — GLUCOSE, CAPILLARY
GLUCOSE-CAPILLARY: 94 mg/dL (ref 70–99)
Glucose-Capillary: 88 mg/dL (ref 70–99)
Glucose-Capillary: 78 mg/dL (ref 70–99)
Glucose-Capillary: 66 mg/dL — ABNORMAL LOW (ref 70–99)
Glucose-Capillary: 76 mg/dL (ref 70–99)
Glucose-Capillary: 91 mg/dL (ref 70–99)

## 2017-07-21 LAB — CBC
HEMATOCRIT: 31.7 % — AB (ref 36.0–46.0)
HEMOGLOBIN: 10.2 g/dL — AB (ref 12.0–15.0)
MCH: 27.9 pg (ref 26.0–34.0)
MCHC: 32.2 g/dL (ref 30.0–36.0)
MCV: 86.6 fL (ref 78.0–100.0)
Platelets: 267 10*3/uL (ref 150–400)
RBC: 3.66 MIL/uL — AB (ref 3.87–5.11)
RDW: 12.8 % (ref 11.5–15.5)
WBC: 8.6 10*3/uL (ref 4.0–10.5)

## 2017-07-21 LAB — MAGNESIUM: Magnesium: 1.8 mg/dL (ref 1.7–2.4)

## 2017-07-21 MED ORDER — POTASSIUM CHLORIDE IN NACL 40-0.9 MEQ/L-% IV SOLN: INTRAVENOUS | Status: DC

## 2017-07-21 MED ORDER — SODIUM CHLORIDE 0.9% FLUSH: 10.0000 mL | INTRAVENOUS | Status: DC | PRN

## 2017-07-21 MED ORDER — POTASSIUM CHLORIDE IN NACL 40-0.9 MEQ/L-% IV SOLN
INTRAVENOUS | Status: DC
  Administered 2017-07-21 (×3): 110 mL/h via INTRAVENOUS
  Filled 2017-07-21 (×3): qty 1000

## 2017-07-21 MED ORDER — POTASSIUM CHLORIDE 20 MEQ/15ML (10%) PO SOLN
40.0000 meq | Freq: Once | ORAL | Status: DC
  Filled 2017-07-21: qty 30

## 2017-07-21 MED ORDER — DEXTROSE-NACL 5-0.45 % IV SOLN
INTRAVENOUS | Status: DC
  Administered 2017-07-21 – 2017-07-22 (×2): via INTRAVENOUS

## 2017-07-21 NOTE — Progress Notes (Signed)
Pt's CBG was 66 at 1735.  Pt has not been eating despite encouragement.  Notified Dr. Candiss Norse, MD who placed a verbal order with read-back for Dextrose5/0.45 NaCl at 100 ml/hr.  Rechecked pt's CGB after 15 min of infusion, at 76.  Will continue to monitor.

## 2017-07-21 NOTE — Progress Notes (Signed)
Peripherally Inserted Central Catheter/Midline Placement  The IV Nurse has discussed with the patient and/or persons authorized to consent for the patient, the purpose of this procedure and the potential benefits and risks involved with this procedure.  The benefits include less needle sticks, lab draws from the catheter, and the patient may be discharged home with the catheter. Risks include, but not limited to, infection, bleeding, blood clot (thrombus formation), and puncture of an artery; nerve damage and irregular heartbeat and possibility to perform a PICC exchange if needed/ordered by physician.  Alternatives to this procedure were also discussed.  Bard Power PICC patient education guide, fact sheet on infection prevention and patient information card has been provided to patient /or left at bedside.    PICC/Midline Placement Documentation  PICC Double Lumen 07/21/17 PICC Right Brachial 35 cm 0 cm (Active)  Indication for Insertion or Continuance of Line Limited venous access - need for IV therapy >5 days (PICC only) 07/21/2017  4:00 PM  Exposed Catheter (cm) 0 cm 07/21/2017  4:00 PM  Site Assessment Clean;Dry;Intact 07/21/2017  4:00 PM  Lumen #1 Status Flushed;Blood return noted;Saline locked 07/21/2017  4:00 PM  Lumen #2 Status Flushed;Blood return noted;Saline locked 07/21/2017  4:00 PM  Dressing Type Transparent 07/21/2017  4:00 PM  Dressing Status Clean;Dry;Intact;Antimicrobial disc in place 07/21/2017  4:00 PM  Line Care Connections checked and tightened 07/21/2017  4:00 PM  Line Adjustment (NICU/IV Team Only) No 07/21/2017  4:00 PM  Dressing Intervention New dressing 07/21/2017  4:00 PM  Dressing Change Due 07/28/17 07/21/2017  4:00 PM       Jasmine [name] 07/21/2017, 4:04 PM
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
## 2017-07-22 ENCOUNTER — Encounter: Payer: Self-pay | Admitting: *Deleted

## 2017-07-22 LAB — BASIC METABOLIC PANEL
ANION GAP: 5 (ref 5–15)
CALCIUM: 7.5 mg/dL — AB (ref 8.9–10.3)
CO2: 23 mmol/L (ref 22–32)
CREATININE: 0.44 mg/dL (ref 0.44–1.00)
Chloride: 104 mmol/L (ref 98–111)
GFR calc Af Amer: >60 mL/min (ref >60)
GFR calc non Af Amer: >60 mL/min (ref >60)
GLUCOSE: 444 mg/dL — AB (ref 70–99)
Potassium: 2.9 mmol/L — ABNORMAL LOW (ref 3.5–5.1)
Sodium: 132 mmol/L — ABNORMAL LOW (ref 135–145)

## 2017-07-22 LAB — GLUCOSE, CAPILLARY
GLUCOSE-CAPILLARY: 96 mg/dL (ref 70–99)
Glucose-Capillary: 108 mg/dL — ABNORMAL HIGH (ref 70–99)
Glucose-Capillary: 92 mg/dL (ref 70–99)
Glucose-Capillary: 116 mg/dL — ABNORMAL HIGH (ref 70–99)

## 2017-07-22 MED ORDER — MIRTAZAPINE 15 MG PO TBDP
15.0000 mg | ORAL_TABLET | Freq: Every day | ORAL | Status: DC
  Administered 2017-07-22 – 2017-07-23 (×2): 15 mg via ORAL
  Filled 2017-07-22 (×2): qty 1

## 2017-07-22 MED ORDER — DOCUSATE SODIUM 50 MG/5ML PO LIQD
100.0000 mg | Freq: Two times a day (BID) | ORAL | Status: DC
  Filled 2017-07-22 (×2): qty 10

## 2017-07-22 MED ORDER — CLONAZEPAM 0.25 MG PO TBDP
0.5000 mg | ORAL_TABLET | Freq: Two times a day (BID) | ORAL | Status: DC
  Administered 2017-07-22 – 2017-07-23 (×2): 0.5 mg via ORAL
  Filled 2017-07-22 (×2): qty 2

## 2017-07-22 MED ORDER — ENSURE ENLIVE PO LIQD
474.0000 mL | Freq: Three times a day (TID) | ORAL | Status: DC
  Administered 2017-07-22 – 2017-07-23 (×3): 474 mL via ORAL

## 2017-07-22 MED ORDER — POTASSIUM CHLORIDE 10 MEQ/100ML IV SOLN
10.0000 meq | INTRAVENOUS | Status: AC
  Administered 2017-07-22 (×6): 10 meq via INTRAVENOUS
  Filled 2017-07-22 (×6): qty 100

## 2017-07-22 MED ORDER — HYDROMORPHONE HCL 1 MG/ML IJ SOLN
1.0000 mg | Freq: Once | INTRAMUSCULAR | Status: AC
  Administered 2017-07-22: 1 mg via INTRAVENOUS
  Filled 2017-07-22: qty 1

## 2017-07-22 NOTE — Consult Note (Signed)
Patient declines consult with psychiatric. She appears to be in significant pain related to TMJ dislocation. Aware to ask for psychiatry consult if changes her mind.   Buford Dresser, DO 07/22/17 4:50 PM
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
## 2017-07-23 LAB — BASIC METABOLIC PANEL
ANION GAP: 12 (ref 5–15)
BUN: <5 mg/dL — ABNORMAL LOW (ref 6–20)
CO2: 28 mmol/L (ref 22–32)
Calcium: 9 mg/dL (ref 8.9–10.3)
Chloride: 101 mmol/L (ref 98–111)
Creatinine, Ser: 0.53 mg/dL (ref 0.44–1.00)
GFR calc Af Amer: >60 mL/min (ref >60)
GLUCOSE: 99 mg/dL (ref 70–99)
POTASSIUM: 3 mmol/L — AB (ref 3.5–5.1)
Sodium: 141 mmol/L (ref 135–145)

## 2017-07-23 LAB — GLUCOSE, CAPILLARY
GLUCOSE-CAPILLARY: 78 mg/dL (ref 70–99)
Glucose-Capillary: 143 mg/dL — ABNORMAL HIGH (ref 70–99)
Glucose-Capillary: 83 mg/dL (ref 70–99)
Glucose-Capillary: 91 mg/dL (ref 70–99)
Glucose-Capillary: 99 mg/dL (ref 70–99)

## 2017-07-23 MED ORDER — ENSURE ENLIVE PO LIQD: 474.0000 mL | Freq: Three times a day (TID) | ORAL | Status: DC

## 2017-07-23 MED ORDER — CLINDAMYCIN HCL 300 MG PO CAPS: 300.0000 mg | ORAL_CAPSULE | Freq: Three times a day (TID) | ORAL | 0 refills | Status: AC

## 2017-07-23 MED ORDER — POTASSIUM CHLORIDE 10 MEQ/100ML IV SOLN
10.0000 meq | INTRAVENOUS | Status: AC
  Administered 2017-07-23 (×6): 10 meq via INTRAVENOUS
  Filled 2017-07-23 (×6): qty 100

## 2017-07-23 MED ORDER — HYDROCODONE-ACETAMINOPHEN 5-325 MG PO TABS: 1.0000 | ORAL_TABLET | Freq: Four times a day (QID) | ORAL | 0 refills | Status: DC | PRN

## 2017-07-23 MED ORDER — MAGNESIUM SULFATE IN D5W 1-5 GM/100ML-% IV SOLN
1.0000 g | Freq: Once | INTRAVENOUS | Status: AC
  Administered 2017-07-23: 1 g via INTRAVENOUS
  Filled 2017-07-23: qty 100

## 2017-07-23 MED ORDER — POTASSIUM CHLORIDE 20 MEQ/15ML (10%) PO SOLN
40.0000 meq | Freq: Once | ORAL | Status: AC
  Administered 2017-07-23: 40 meq via ORAL
  Filled 2017-07-23: qty 30

## 2017-07-23 NOTE — Progress Notes (Signed)
Discharge instructions reviewed with patient. All questions answered at this time. RXs given. Transport home for family.   Ave Filter, RN

## 2017-07-23 NOTE — Discharge Instructions (Signed)
Follow with Primary MD in 7 days   Get CBC, CMP, 2 view Chest X ray checked  by Primary MD  in 5-7 days (   Activity: As tolerated with Full fall precautions use walker/cane & assistance as needed  Disposition Home    Diet:   Full Liquid diet with feeding assistance and aspiration precautions.  For Heart failure patients - Check your Weight same time everyday, if you gain over 2 pounds, or you develop in leg swelling, experience more shortness of breath or chest pain, call your Primary MD immediately. Follow Cardiac Low Salt Diet and 1.5 lit/day fluid restriction.  Special Instructions: If you have smoked or chewed Tobacco  in the last 2 yrs please stop smoking, stop any regular Alcohol  and or any Recreational drug use.  On your next visit with your primary care physician please Get Medicines reviewed and adjusted.  Please request your Prim.MD to go over all Hospital Tests and Procedure/Radiological results at the follow up, please get all Hospital records sent to your Prim MD by signing hospital release before you go home.  If you experience worsening of your admission symptoms, develop shortness of breath, life threatening emergency, suicidal or homicidal thoughts you must seek medical attention immediately by calling 911 or calling your MD immediately  if symptoms less severe.  You Must read complete instructions/literature along with all the possible adverse reactions/side effects for all the Medicines you take and that have been prescribed to you. Take any new Medicines after you have completely understood and accpet all the possible adverse reactions/side effects.   Do not drive, operate heavy machinery, perform activities at heights, swimming or participation in water activities or provide baby sitting services if your were admitted for syncope or siezures until you have seen by Primary MD or a Neurologist and advised to do so again.  Do not drive when taking Pain medications.     Do not take more than prescribed Pain, Sleep and Anxiety Medications  Wear Seat belts while driving.
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
## 2017-07-23 NOTE — Progress Notes (Signed)
IV Team: Order received to discontinue PICC: Checked with pt's nurse to confirm. Per Havy, RN, pt will not be receiving IV antibiotics at home. Remove PICC. Same done. Pt and nurse instructed on after care. Teach back complete.

## 2017-07-23 NOTE — Progress Notes (Signed)
Physical Therapy Treatment Patient Details Name: Jasmine [name] MRN: [id number] DOB: 12-27-1990 Today's Date: 07/23/2017    History of Present Illness 27 y.o. female with history of proptosis, seizure disorder-brought in after she sustained a facial injury causing dislocation of the TMJ joint, because of significant pain she was not able to take any of her oral antiseizure medications, and as result had breakthrough seizures.  TMJ joint was reduced in the ED, however she redislocated her TMJ and was in severe pain.  She was assessed by oral surgery, and went closed reduction of the TMJ    PT Comments    Patient with improvements in ambulation and activity tolerance today. Ambulated without device or physical assist. Educated on safety with mobility for discharge home. Patient appears in better spirits today.    Follow Up Recommendations  Home health PT;Supervision/Assistance - 24 hour     Equipment Recommendations       Recommendations for Other Services       Precautions / Restrictions Precautions Precautions: Fall Restrictions Weight Bearing Restrictions: No    Mobility  Bed Mobility               General bed mobility comments: recevied in chair  Transfers Overall transfer level: Needs assistance Equipment used: None Transfers: Sit to/from Stand Sit to Stand: Min guard         General transfer comment: min guard for safety and stability  Ambulation/Gait Ambulation/Gait assistance: Min guard Gait Distance (Feet): 180 Feet Assistive device: None Gait Pattern/deviations: Step-to pattern;Step-through pattern;Shuffle;Antalgic;Decreased dorsiflexion - left;Trunk flexed Gait velocity: decreased Gait velocity interpretation: <1.8 ft/sec, indicate of risk for recurrent falls General Gait Details: improved stability and tolerance today   Stairs Stairs: Yes Stairs assistance: Supervision Stair Management: Two rails Number of Stairs: 2 General stair comments: VCs  for step to sequencing   Wheelchair Mobility    Modified Rankin (Stroke Patients Only)       Balance Overall balance assessment: Needs assistance Sitting-balance support: Bilateral upper extremity supported Sitting balance-Leahy Scale: Good     Standing balance support: During functional activity Standing balance-Leahy Scale: Fair                              Cognition Arousal/Alertness: Awake/alert Behavior During Therapy: WFL for tasks assessed/performed Overall Cognitive Status: No family/caregiver present to determine baseline cognitive functioning                                        Exercises      General Comments        Pertinent Vitals/Pain Pain Assessment: Faces Faces Pain Scale: Hurts little more Pain Location: jaw Pain Descriptors / Indicators: Sore Pain Intervention(s): Monitored during session    Home Living                      Prior Function            PT Goals (current goals can now be found in the care plan section) Acute Rehab PT Goals Patient Stated Goal: to see her daughter PT Goal Formulation: With patient Time For Goal Achievement: 08/05/17 Potential to Achieve Goals: Good Progress towards PT goals: Progressing toward goals    Frequency    Min 3X/week      PT Plan Current plan remains appropriate    Co-evaluation  AM-PAC PT "6 Clicks" Daily Activity  Outcome Measure  Difficulty turning over in bed (including adjusting bedclothes, sheets and blankets)?: None Difficulty moving from lying on back to sitting on the side of the bed? : None Difficulty sitting down on and standing up from a chair with arms (e.g., wheelchair, bedside commode, etc,.)?: A Little Help needed moving to and from a bed to chair (including a wheelchair)?: A Little Help needed walking in hospital room?: A Little Help needed climbing 3-5 steps with a railing? : A Little 6 Click Score: 20    End  of Session Equipment Utilized During Treatment: Gait belt Activity Tolerance: Patient tolerated treatment well Patient left: in chair;with call bell/phone within reach Nurse Communication: Mobility status PT Visit Diagnosis: Muscle weakness (generalized) (M62.81);Difficulty in walking, not elsewhere classified (R26.2)     Time: 7628-3151 PT Time Calculation (min) (ACUTE ONLY): 17 min  Charges:  $Gait Training: 8-22 mins                    G Codes:       Alben Deeds, PT DPT  Board Certified Neurologic Specialist [place] 07/23/2017, 2:47 PM

## 2017-07-23 NOTE — Care Management Note (Signed)
Case Management Note  Patient Details  Name: Jasmine [name] MRN: [id number] Date of Birth: [birth year]  Subjective/Objective:                    Action/Plan: Patient discharging home with [facility] services. CM provided her choice and [facility] selected. Butch Penny with [facility] notified and accepted the referral. Pt with orders for suction, walker and yankeur. Butch Penny with [facility] aware. Suction will be delivered to the home. Gilford Rile will be delivered to the room. Pt requesting coupons for Ensure. CM set her up to receive email coupons from the [employer]. Pt states she has transportation home today.   Expected Discharge Date:  07/23/17               Expected Discharge Plan:  Home/Self Care  In-House Referral:     Discharge planning Services  CM Consult  Post Acute Care Choice:  Home Health, Durable Medical Equipment Choice offered to:  Patient  DME Arranged:  Walker rolling, Suction DME Agency:  [place]:  RN, PT, Speech Therapy, Social Work [facility] Agency:  [place]  Status of Service:  Completed, signed off  If discussed at [employer], dates discussed:    Additional Comments:  Pollie Friar, RN 07/23/2017, 1:41 PM

## 2017-07-24 DIAGNOSIS — G4739 Other sleep apnea: Secondary | ICD-10-CM | POA: Diagnosis not present

## 2017-07-24 DIAGNOSIS — S0083XA Contusion of other part of head, initial encounter: Secondary | ICD-10-CM | POA: Diagnosis not present

## 2017-07-24 DIAGNOSIS — R0602 Shortness of breath: Secondary | ICD-10-CM | POA: Diagnosis not present

## 2017-07-24 DIAGNOSIS — J45909 Unspecified asthma, uncomplicated: Secondary | ICD-10-CM | POA: Diagnosis not present

## 2017-07-24 DIAGNOSIS — R2689 Other abnormalities of gait and mobility: Secondary | ICD-10-CM | POA: Diagnosis not present

## 2017-07-24 DIAGNOSIS — R569 Unspecified convulsions: Secondary | ICD-10-CM | POA: Diagnosis not present

## 2017-07-24 DIAGNOSIS — J45901 Unspecified asthma with (acute) exacerbation: Secondary | ICD-10-CM | POA: Diagnosis not present

## 2017-07-25 ENCOUNTER — Emergency Department (HOSPITAL_COMMUNITY): Payer: Medicaid Other

## 2017-07-25 ENCOUNTER — Other Ambulatory Visit: Payer: Self-pay

## 2017-07-25 ENCOUNTER — Encounter (HOSPITAL_COMMUNITY): Payer: Self-pay | Admitting: Emergency Medicine

## 2017-07-25 ENCOUNTER — Emergency Department (HOSPITAL_COMMUNITY)
Admission: EM | Admit: 2017-07-25 | Discharge: 2017-07-26 | Disposition: A | Discharge status: Home / Self Care | Payer: Medicaid Other | Attending: Emergency Medicine | Admitting: Emergency Medicine

## 2017-07-25 DIAGNOSIS — R51 Headache: Secondary | ICD-10-CM | POA: Diagnosis not present

## 2017-07-25 DIAGNOSIS — Z87891 Personal history of nicotine dependence: Secondary | ICD-10-CM | POA: Diagnosis not present

## 2017-07-25 DIAGNOSIS — S0301XA Dislocation of jaw, right side, initial encounter: Secondary | ICD-10-CM | POA: Insufficient documentation

## 2017-07-25 DIAGNOSIS — J45909 Unspecified asthma, uncomplicated: Secondary | ICD-10-CM | POA: Insufficient documentation

## 2017-07-25 DIAGNOSIS — Z79899 Other long term (current) drug therapy: Secondary | ICD-10-CM | POA: Insufficient documentation

## 2017-07-25 DIAGNOSIS — Z91040 Latex allergy status: Secondary | ICD-10-CM | POA: Diagnosis not present

## 2017-07-25 DIAGNOSIS — Z91010 Allergy to peanuts: Secondary | ICD-10-CM | POA: Diagnosis not present

## 2017-07-25 DIAGNOSIS — Y998 Other external cause status: Secondary | ICD-10-CM | POA: Insufficient documentation

## 2017-07-25 DIAGNOSIS — X58XXXA Exposure to other specified factors, initial encounter: Secondary | ICD-10-CM | POA: Diagnosis not present

## 2017-07-25 DIAGNOSIS — S0993XA Unspecified injury of face, initial encounter: Secondary | ICD-10-CM | POA: Diagnosis present

## 2017-07-25 DIAGNOSIS — D573 Sickle-cell trait: Secondary | ICD-10-CM | POA: Insufficient documentation

## 2017-07-25 DIAGNOSIS — Y929 Unspecified place or not applicable: Secondary | ICD-10-CM | POA: Diagnosis not present

## 2017-07-25 DIAGNOSIS — Y9389 Activity, other specified: Secondary | ICD-10-CM | POA: Insufficient documentation

## 2017-07-25 DIAGNOSIS — Z9049 Acquired absence of other specified parts of digestive tract: Secondary | ICD-10-CM | POA: Diagnosis not present

## 2017-07-25 DIAGNOSIS — S0300XA Dislocation of jaw, unspecified side, initial encounter: Secondary | ICD-10-CM

## 2017-07-25 MED ORDER — SODIUM CHLORIDE 0.9 % IV BOLUS (SEPSIS)
1000.0000 mL | Freq: Once | INTRAVENOUS | Status: AC
Start: 2017-07-25 — End: 2017-07-26
  Administered 2017-07-26: 1000 mL via INTRAVENOUS

## 2017-07-25 MED ORDER — FENTANYL CITRATE (PF) 100 MCG/2ML IJ SOLN
100.0000 ug | Freq: Once | INTRAMUSCULAR | Status: AC
  Administered 2017-07-25: 100 ug via INTRAVENOUS
  Filled 2017-07-25: qty 2

## 2017-07-25 NOTE — ED Triage Notes (Signed)
Pt had a procedure on 7/3 at this facility and has jaw wired shut. Pt is hard to understand d/t this but states she "clipped" some of the wires and was told to come to the ED.  Pt has had an episode of vomiting as well.

## 2017-07-26 MED ORDER — LEVETIRACETAM IN NACL 500 MG/100ML IV SOLN
500.0000 mg | Freq: Once | INTRAVENOUS | Status: AC
  Administered 2017-07-26: 500 mg via INTRAVENOUS
  Filled 2017-07-26: qty 100

## 2017-07-26 MED ORDER — ONDANSETRON HCL 4 MG/2ML IJ SOLN: 4.0000 mg | Freq: Once | INTRAMUSCULAR | Status: DC

## 2017-07-26 MED ORDER — FENTANYL CITRATE (PF) 100 MCG/2ML IJ SOLN
50.0000 ug | Freq: Once | INTRAMUSCULAR | Status: AC
  Administered 2017-07-26: 50 ug via INTRAVENOUS
  Filled 2017-07-26: qty 2

## 2017-07-26 MED ORDER — CLINDAMYCIN PALMITATE HCL 75 MG/5ML PO SOLR: 300.0000 mg | Freq: Three times a day (TID) | ORAL | 0 refills | Status: DC

## 2017-07-26 NOTE — ED Notes (Signed)
Oral surgery Dr. Kavin Leech J at bedside placing bands on pt mouth

## 2017-07-26 NOTE — ED Notes (Signed)
Alert and oriented x4. Skin warm and dry. Respirations equal and unlabored. Pt able to manage secretions. Pt able to talk with clear speech. Airway patent. Pt states needed to vomit on 07/12/219 and cut the wires loose from mouth (recent oral surgery) to excrete the emesis. Pt denies current nausea

## 2017-07-26 NOTE — ED Provider Notes (Signed)
[facility] EMERGENCY DEPARTMENT Provider Note   CSN: [id number] Arrival date & time: 07/25/17  2207     History   Chief Complaint Chief Complaint  Patient presents with  . Jaw Pain    HPI Jasmine [name] is a 27 y.o. female.  The history is provided by the patient.  Patient presents for jaw pain. Patient underwent closed TMJ dislocation reduction with intermaxillary fixation on July 4.  She reports since that time she been having pain in her jaw.  Tonight she started to vomit, and so she cut the wires to release the fixation.  Having worsening jaw pain.  No trauma to her face tonight.  Her symptoms are worsening.  Nothing improves her symptoms.  Reports that she is taking her seizure medications by crushing them up. Past Medical History:  Diagnosis Date  . Asthma   . Complication of anesthesia    "I wake up during anesthesia" (10/14/2015)  . 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  Marland Kitchen DVT (deep vein thrombosis) in pregnancy ([place])   . Epilepsy ([place])   . GERD (gastroesophageal reflux disease)   . Heart murmur    last work up age 89- no symtoms  . On home oxygen therapy    "2L when I sleep" (07/18/2017)  . Pancreatitis   . Sickle cell trait ([place])   . Sleep apnea    "used to wear CPAP; don't have one here in [place] since I moved in 2014" (10/14/2015)    Patient Active Problem List   Diagnosis Date Noted  . Facial contusion 07/17/2017  . Closed dislocation of right jaw 07/17/2017  . Nausea with hives 07/09/2017  . History of DVT (deep vein thrombosis) in pregnancy ([place]) 05/29/2017  . Sickle cell trait ([place]) 05/29/2017  . Acute asthma exacerbation 05/29/2017  . Chronic pancreatitis ([place]) 05/29/2017  . Seizures ([place]) 05/29/2017  . Tobacco abuse 05/29/2017  . Acute on chronic respiratory failure with hypoxemia ([place]) 05/29/2017  . SOB (shortness of breath) 04/08/2017  . Cough 04/07/2017  . 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 Acute on  chronic respiratory failure with hypoxia ([place]) 03/10/2017  . Colitis 11/12/2016  . Seizure ([place]) 11/12/2016  . Calculus of bile duct with acute cholecystitis with obstruction 10/14/2015  . Adjustment disorder with mixed anxiety and depressed mood 04/26/2015  . Low blood potassium 04/19/2015  . Asthma with acute exacerbation 04/19/2015  . Asthma 01/03/2015  . Asthma, chronic, unspecified asthma severity, with acute exacerbation 01/03/2015  . Influenza-like illness 01/03/2015  . Status post cesarean section 11/08/2014  . Previous cesarean section complicating pregnancy, antepartum condition or complication   . GERD (gastroesophageal reflux disease)   . Gastroesophageal reflux disease without esophagitis   . Abnormal biochemical finding on antenatal screening of mother   . Abnormal quad screen   . Echogenic focus of heart of fetus affecting antepartum care of mother   . Drug use affecting pregnancy, antepartum 05/19/2014  . Seizure disorder during pregnancy, antepartum ([place]) 05/13/2014  . Supervision of high risk pregnancy, antepartum 05/13/2014  . Asthma complicating pregnancy, antepartum 05/13/2014  . History of food anaphylaxis 05/13/2014  . Seizure disorder, sept 2015 last seizure 01/19/2013    Past Surgical History:  Procedure Laterality Date  . APPENDECTOMY    . CESAREAN SECTION  2013  . CESAREAN SECTION N/A 11/08/2014   Procedure: CESAREAN SECTION;  Surgeon: Truett Mainland, DO;  Location: [facility];  Service: Obstetrics;  Laterality: N/A;  .  CHOLECYSTECTOMY N/A 10/16/2015   Procedure: LAPAROSCOPIC CHOLECYSTECTOMY WITH POSSIBLE INTRAOPERATIVE CHOLANGIOGRAM;  Surgeon: Donnie Mesa, MD;  Location: [facility];  Service: General;  Laterality: N/A;  . CLOSED REDUCTION MANDIBLE WITH MANDIBULOMA N/A 07/17/2017   Procedure: REDUCTION TMJ WITH INTRAMAXILLARY FIXATION;  Surgeon: Diona Browner, DDS;  Location: [facility];  Service: Oral Surgery;  Laterality: N/A;  . ESOPHAGOGASTRODUODENOSCOPY (EGD) WITH  PROPOFOL N/A 11/15/2016   Procedure: ESOPHAGOGASTRODUODENOSCOPY (EGD) WITH PROPOFOL;  Surgeon: Clarene Essex, MD;  Location: [facility];  Service: Endoscopy;  Laterality: N/A;  . INGUINAL HERNIA REPAIR Bilateral ~ 1996  . NERVE, TENDON AND ARTERY REPAIR Right 09/23/2012   Procedure: I&D and Repair As Necessary/Right Hand and Palm;  Surgeon: Roseanne Kaufman, MD;  Location: [facility];  Service: Orthopedics;  Laterality: Right;  . TONSILLECTOMY       OB History    Gravida  3   Para  3   Term  3   Preterm      AB      Living  3     SAB      TAB      Ectopic      Multiple  0   Live Births  3            Home Medications    Prior to Admission medications   Medication Sig Start Date End Date Taking? Authorizing Provider  albuterol (PROVENTIL HFA;VENTOLIN HFA) 108 (90 Base) MCG/ACT inhaler Inhale 2 puffs into the lungs every 4 (four) hours as needed for wheezing or shortness of breath. 01/06/17   Robbie Lis, MD  Alum & Mag Hydroxide-Simeth (GI COCKTAIL) SUSP suspension Take 30 mLs by mouth 2 (two) times daily as needed for indigestion. Shake well.    [provider]  Amylase-Lipase-Protease (CREON 20 PO) Take 2-7 capsules by mouth See admin instructions. Take 2-7 capsules by mouth with each meal and snack (dose based on amount of food intake)    [provider]  Ascorbic Acid (VITAMIN C PO) Take 1 tablet by mouth daily.    [provider]  Aztreonam Lysine 75 MG SOLR Take 75 mg by nebulization 3 (three) times daily.    [provider]  clindamycin (CLEOCIN) 300 MG capsule Take 1 capsule (300 mg total) by mouth 3 (three) times daily for 7 days. 07/23/17 07/30/17  Thurnell Lose, MD  Docusate Sodium (COLACE PO) Take 30 mLs by mouth daily.    [provider]  dornase alpha (PULMOZYME) 1 MG/ML nebulizer solution Take 2.5 mg by nebulization 2 (two) times daily.     [provider]  feeding supplement, ENSURE ENLIVE, (ENSURE ENLIVE)  LIQD Take 474 mLs by mouth 3 (three) times daily between meals. 474 mL 3 times a day, provide 3-week supply 07/23/17   Thurnell Lose, MD  fluticasone (FLONASE) 50 MCG/ACT nasal spray Place 1 spray into both nostrils daily.    [provider]  Fluticasone Propionate, Inhal, (FLOVENT IN) Inhale into the lungs.    [provider]  Fluticasone-Salmeterol (ADVAIR) 500-50 MCG/DOSE AEPB Inhale 1 puff into the lungs 2 (two) times daily.    [provider]  HYDROcodone-acetaminophen (NORCO/VICODIN) 5-325 MG tablet Take 1 tablet by mouth every 6 (six) hours as needed for moderate pain (pain). 07/23/17   Thurnell Lose, MD  ibuprofen (ADVIL,MOTRIN) 800 MG tablet Take 800 mg by mouth every 8 (eight) hours as needed.    [provider]  levETIRAcetam (KEPPRA) 500 MG tablet  Take 1 tablet (500 mg total) by mouth 2 (two) times daily. 07/11/17   Domenic Polite, MD  LUMACAFTOR-IVACAFTOR PO Take by nebulization 2 (two) times daily.    [provider]  montelukast (SINGULAIR) 10 MG tablet Take 10 mg by mouth at bedtime.    [provider]  Multiple Vitamin (MULTIVITAMIN WITH MINERALS) TABS tablet Take 2 tablets by mouth daily.    [provider]  tobramycin, PF, (TOBI) 300 MG/5ML nebulizer solution Take 300 mg by nebulization every 12 (twelve) hours.    [provider]    Family History Family History  Problem Relation Age of Onset  . Cancer Mother        cervical cancer  . Asthma Mother   . Hypertension Father   . Sickle cell anemia Father   . Asthma Sister   . Diabetes Maternal Aunt   . Cancer Maternal Grandmother     Social History Social History   Tobacco Use  . Smoking status: Former Smoker    Packs/day: 0.25    Years: 4.00    Pack years: 1.00    Types: Cigarettes    Last attempt to quit: 10/02/2015    Years since quitting: 1.8  . Smokeless tobacco: Never Used  Substance Use Topics  . Alcohol use: No    Alcohol/week:  0.0 oz    Frequency: Never  . Drug use: No     Allergies   Cheese; Chocolate; Ibuprofen; Ivp dye [iodinated diagnostic agents]; Latex; Orange juice [orange oil]; Other; Peach [prunus persica]; Peanuts [peanut oil]; Pear; Prednisone; Raspberry; Tylenol [acetaminophen]; Amoxicillin; Apricot flavor; Doxycycline; Erythromycin; Milk of magnesia [magnesium hydroxide]; and Penicillins   Review of Systems Review of Systems  HENT: Positive for dental problem.   Cardiovascular: Negative for chest pain.  Gastrointestinal: Positive for vomiting. Negative for abdominal pain.  Psychiatric/Behavioral: The patient is nervous/anxious.   All other systems reviewed and are negative.    Physical Exam Updated Vital Signs BP (!) 153/95 (BP Location: Right Arm)   Pulse (!) 144   Temp 98.1 F (36.7 C) (Oral)   Resp 18   LMP 06/26/2017 (Exact Date)   SpO2 100%   Physical Exam  CONSTITUTIONAL: Well developed/well nourished, anxious HEAD: Normocephalic/atraumatic EYES: EOMI/PERRL ENMT: Mucous membranes moist, very poor dentition, malocclusion noted, hardware from fixation noted, no lacerations, no abscesses, no intraoral edema, no stridor NECK: supple no meningeal signs CV: S1/S2 noted, no murmurs/rubs/gallops noted LUNGS: Lungs are clear to auscultation bilaterally, no apparent distress ABDOMEN: soft, nontender NEURO: Pt is awake/alert/appropriate, moves all extremitiesx4.  No facial droop.   EXTREMITIES: pulses normal/equal, full ROM SKIN: warm, color normal PSYCH: anxious  ED Treatments / Results  Labs (all labs ordered are listed, but only abnormal results are displayed) Labs Reviewed - No data to display  EKG None  Radiology Ct Maxillofacial Wo Contrast  Result Date: 07/25/2017 CLINICAL DATA:  TMJ pain or limited movement EXAM: CT MAXILLOFACIAL WITHOUT CONTRAST TECHNIQUE: Multidetector CT imaging of the maxillofacial structures was performed. Multiplanar CT image reconstructions  were also generated. COMPARISON:  CT 07/17/2017 FINDINGS: Osseous: Anterior translation of the right mandibular condyle, stable from prior. This is a new finding from 06/12/2017. No associated fracture. The TMJ disc is not visible by CT. Maxillary incisors are eroded or chipped. Orbits: Negative Sinuses: Rounded opacity in the left frontal sinus, favor retention cysts. No acute sinusitis. Soft tissues: No inflammatory changes. Limited intracranial: Negative IMPRESSION: Anterior translation of the right mandibular condyle that was also  seen 07/17/2017. Negative for fracture. Electronically Signed   By: Monte Fantasia M.D.   On: 07/25/2017 22:49    Procedures Procedures   Medications Ordered in ED Medications  ondansetron (ZOFRAN) injection 4 mg (has no administration in time range)  sodium chloride 0.9 % bolus 1,000 mL (1,000 mLs Intravenous New Bag/Given 07/26/17 0109)  fentaNYL (SUBLIMAZE) injection 100 mcg (100 mcg Intravenous Given 07/25/17 2357)  levETIRAcetam (KEPPRA) IVPB 500 mg/100 mL premix (500 mg Intravenous New Bag/Given 07/26/17 0112)     Initial Impression / Assessment and Plan / ED Course  I have reviewed the triage vital signs and the nursing notes.  Pertinent labs & imaging results that were available during my care of the patient were reviewed by me and considered in my medical decision making (see chart for details).    12:19 AM Attempting to contact her oral surgeon that placed the intramaxillary fixation on July 4. At this time we will keep patient n.p.o., IV fluids, pain medicines as well as her AED therapy 2:13 AM She is stable.  I spoke to her oral surgeon Dr. Hoyt Koch.  He will see patient at 7 AM and plans to put rubber bands on her fixation. Pt is  Improved.  7:17 AM Patient seen by Dr. Hoyt Koch.  She is stable.  He has placed bands in her mouth.  He request starting clindamycin.   she will follow-up next week Advised pain control with OTC meds. Final Clinical  Impressions(s) / ED Diagnoses   Final diagnoses:  Dislocation of temporomandibular joint, initial encounter    ED Discharge Orders        Ordered    clindamycin (CLEOCIN) 75 MG/5ML solution  3 times daily     07/26/17 0708       Ripley Fraise, MD 07/26/17 [phone]

## 2017-07-26 NOTE — Consult Note (Signed)
Reason for Consult: jaw dislocation Referring Physician: ED  Jasmine [name] is an 27 y.o. female who had a seizure this morning and had to cut her jaw fixation wires. She had previously been treated for jaw dislocation with intermaxillary fixation. CT this am showed jaw dislocation.     Past Medical History:  Diagnosis Date  . Asthma   . Complication of anesthesia    "I wake up during anesthesia" (10/14/2015)  . 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  Marland Kitchen DVT (deep vein thrombosis) in pregnancy ([place])   . Epilepsy ([place])   . GERD (gastroesophageal reflux disease)   . Heart murmur    last work up age 33- no symtoms  . On home oxygen therapy    "2L when I sleep" (07/18/2017)  . Pancreatitis   . Sickle cell trait ([place])   . Sleep apnea    "used to wear CPAP; don't have one here in [place] since I moved in 2014" (10/14/2015)    Past Surgical History:  Procedure Laterality Date  . APPENDECTOMY    . CESAREAN SECTION  2013  . CESAREAN SECTION N/A 11/08/2014   Procedure: CESAREAN SECTION;  Surgeon: Truett Mainland, DO;  Location: [facility];  Service: Obstetrics;  Laterality: N/A;  . CHOLECYSTECTOMY N/A 10/16/2015   Procedure: LAPAROSCOPIC CHOLECYSTECTOMY WITH POSSIBLE INTRAOPERATIVE CHOLANGIOGRAM;  Surgeon: Donnie Mesa, MD;  Location: [facility];  Service: General;  Laterality: N/A;  . CLOSED REDUCTION MANDIBLE WITH MANDIBULOMA N/A 07/17/2017   Procedure: REDUCTION TMJ WITH INTRAMAXILLARY FIXATION;  Surgeon: Diona Browner, DDS;  Location: [facility];  Service: Oral Surgery;  Laterality: N/A;  . ESOPHAGOGASTRODUODENOSCOPY (EGD) WITH PROPOFOL N/A 11/15/2016   Procedure: ESOPHAGOGASTRODUODENOSCOPY (EGD) WITH PROPOFOL;  Surgeon: Clarene Essex, MD;  Location: [facility];  Service: Endoscopy;  Laterality: N/A;  . INGUINAL HERNIA REPAIR Bilateral ~ 1996  . NERVE, TENDON AND ARTERY REPAIR Right 09/23/2012   Procedure: I&D and Repair As Necessary/Right Hand and Palm;  Surgeon: Roseanne Kaufman, MD;   Location: [facility];  Service: Orthopedics;  Laterality: Right;  . TONSILLECTOMY      Family History  Problem Relation Age of Onset  . Cancer Mother        cervical cancer  . Asthma Mother   . Hypertension Father   . Sickle cell anemia Father   . Asthma Sister   . Diabetes Maternal Aunt   . Cancer Maternal Grandmother     Social History:  reports that she quit smoking about 21 months ago. Her smoking use included cigarettes. She has a 1.00 pack-year smoking history. She has never used smokeless tobacco. She reports that she does not drink alcohol or use drugs.  Allergies:  Allergies  Allergen Reactions  . Cheese Shortness Of Breath  . Chocolate Shortness Of Breath  . Ibuprofen Hives and Shortness Of Breath    Children's ibuprofen  . Ivp Dye [Iodinated Diagnostic Agents] Shortness Of Breath  . Latex Anaphylaxis, Swelling and Other (See Comments)    Reaction:  Localized swelling   . Orange Juice [Orange Oil] Shortness Of Breath  . Other Hives and Other (See Comments)    Pt states that she is allergic to all steroids except IV solu-medrol.    Marland Kitchen Peach [Prunus Persica] Anaphylaxis  . Peanuts [Peanut Oil] Anaphylaxis  . Pear Anaphylaxis  . Prednisone Hives  . Raspberry Anaphylaxis  . Tylenol [Acetaminophen] Hives, Shortness Of Breath and Other (See Comments)    Pt states that this  is only with the liquid form  . Amoxicillin Hives and Other (See Comments)    Has patient had a PCN reaction causing immediate rash, facial/tongue/throat swelling, SOB or lightheadedness with hypotension: No Has patient had a PCN reaction causing severe rash involving mucus membranes or skin necrosis: No Has patient had a PCN reaction that required hospitalization: No Has patient had a PCN reaction occurring within the last 10 years: No If all of the above answers are "NO", then may proceed with Cephalosporin use.  Marland Kitchen Apricot Flavor Hives  . Doxycycline Hives  . Erythromycin Hives  . Milk Of Magnesia  [Magnesium Hydroxide] Hives and Itching  . Penicillins Hives and Other (See Comments)    Has patient had a PCN reaction causing immediate rash, facial/tongue/throat swelling, SOB or lightheadedness with hypotension: No Has patient had a PCN reaction causing severe rash involving mucus membranes or skin necrosis: No Has patient had a PCN reaction that required hospitalization: No Has patient had a PCN reaction occurring within the last 10 years: No If all of the above answers are "NO", then may proceed with Cephalosporin use.    Medications: I have reviewed the patient's current medications.  No results found for this or any previous visit (from the past 48 hour(s)).  Ct Maxillofacial Wo Contrast  Result Date: 07/25/2017 CLINICAL DATA:  TMJ pain or limited movement EXAM: CT MAXILLOFACIAL WITHOUT CONTRAST TECHNIQUE: Multidetector CT imaging of the maxillofacial structures was performed. Multiplanar CT image reconstructions were also generated. COMPARISON:  CT 07/17/2017 FINDINGS: Osseous: Anterior translation of the right mandibular condyle, stable from prior. This is a new finding from 06/12/2017. No associated fracture. The TMJ disc is not visible by CT. Maxillary incisors are eroded or chipped. Orbits: Negative Sinuses: Rounded opacity in the left frontal sinus, favor retention cysts. No acute sinusitis. Soft tissues: No inflammatory changes. Limited intracranial: Negative IMPRESSION: Anterior translation of the right mandibular condyle that was also seen 07/17/2017. Negative for fracture. Electronically Signed   By: Monte Fantasia M.D.   On: 07/25/2017 22:49    ROS Blood pressure 109/65, pulse 64, temperature 98.1 F (36.7 C), temperature source Oral, resp. rate 17, last menstrual period 06/26/2017, SpO2 99 %. General appearance: alert, cooperative and no distress Head: Normocephalic, without obvious abnormality, atraumatic Nose: Nares normal. Septum midline. Mucosa normal. No drainage or  sinus tenderness. Throat: Poor dentition. Arch bars intact. Occlusion stable when mouth closed. Pharynx clear Neck: no adenopathy and supple, symmetrical, trachea midline  Assessment/Plan: S/p repeat Jaw dislocation after seizure. Elastics placed to secure occlusion but allow opening. Patient to be seen in my office next week regarding abscess left jaw.  Diona Browner 07/26/2017, 6:50 AM

## 2017-07-26 NOTE — ED Notes (Signed)
Repaged Oral Surg

## 2017-07-30 DIAGNOSIS — D573 Sickle-cell trait: Secondary | ICD-10-CM | POA: Diagnosis not present

## 2017-07-30 DIAGNOSIS — Z7951 Long term (current) use of inhaled steroids: Secondary | ICD-10-CM | POA: Diagnosis not present

## 2017-07-30 DIAGNOSIS — G40909 Epilepsy, unspecified, not intractable, without status epilepticus: Secondary | ICD-10-CM | POA: Diagnosis not present

## 2017-07-30 DIAGNOSIS — T7411XD Adult physical abuse, confirmed, subsequent encounter: Secondary | ICD-10-CM | POA: Diagnosis not present

## 2017-07-30 DIAGNOSIS — J45909 Unspecified asthma, uncomplicated: Secondary | ICD-10-CM | POA: Diagnosis not present

## 2017-07-30 DIAGNOSIS — R1310 Dysphagia, unspecified: Secondary | ICD-10-CM | POA: Diagnosis not present

## 2017-07-30 DIAGNOSIS — K219 Gastro-esophageal reflux disease without esophagitis: Secondary | ICD-10-CM | POA: Diagnosis not present

## 2017-07-30 DIAGNOSIS — K047 Periapical abscess without sinus: Secondary | ICD-10-CM | POA: Diagnosis not present

## 2017-07-30 DIAGNOSIS — S0301XD Dislocation of jaw, right side, subsequent encounter: Secondary | ICD-10-CM | POA: Diagnosis not present

## 2017-07-30 DIAGNOSIS — Z86718 Personal history of other venous thrombosis and embolism: Secondary | ICD-10-CM | POA: Diagnosis not present

## 2017-07-30 DIAGNOSIS — F329 Major depressive disorder, single episode, unspecified: Secondary | ICD-10-CM | POA: Diagnosis not present

## 2017-08-01 DIAGNOSIS — K219 Gastro-esophageal reflux disease without esophagitis: Secondary | ICD-10-CM | POA: Diagnosis not present

## 2017-08-01 DIAGNOSIS — Z86718 Personal history of other venous thrombosis and embolism: Secondary | ICD-10-CM | POA: Diagnosis not present

## 2017-08-01 DIAGNOSIS — T7411XD Adult physical abuse, confirmed, subsequent encounter: Secondary | ICD-10-CM | POA: Diagnosis not present

## 2017-08-01 DIAGNOSIS — Z7951 Long term (current) use of inhaled steroids: Secondary | ICD-10-CM | POA: Diagnosis not present

## 2017-08-01 DIAGNOSIS — K047 Periapical abscess without sinus: Secondary | ICD-10-CM | POA: Diagnosis not present

## 2017-08-01 DIAGNOSIS — R1310 Dysphagia, unspecified: Secondary | ICD-10-CM | POA: Diagnosis not present

## 2017-08-01 DIAGNOSIS — F329 Major depressive disorder, single episode, unspecified: Secondary | ICD-10-CM | POA: Diagnosis not present

## 2017-08-01 DIAGNOSIS — J45909 Unspecified asthma, uncomplicated: Secondary | ICD-10-CM | POA: Diagnosis not present

## 2017-08-01 DIAGNOSIS — S0301XD Dislocation of jaw, right side, subsequent encounter: Secondary | ICD-10-CM | POA: Diagnosis not present

## 2017-08-01 DIAGNOSIS — D573 Sickle-cell trait: Secondary | ICD-10-CM | POA: Diagnosis not present

## 2017-08-01 DIAGNOSIS — G40909 Epilepsy, unspecified, not intractable, without status epilepticus: Secondary | ICD-10-CM | POA: Diagnosis not present

## 2017-08-05 ENCOUNTER — Encounter: Payer: Self-pay | Admitting: Family Medicine

## 2017-08-13 DIAGNOSIS — J45909 Unspecified asthma, uncomplicated: Secondary | ICD-10-CM | POA: Diagnosis not present

## 2017-08-13 DIAGNOSIS — F329 Major depressive disorder, single episode, unspecified: Secondary | ICD-10-CM | POA: Diagnosis not present

## 2017-08-13 DIAGNOSIS — G40909 Epilepsy, unspecified, not intractable, without status epilepticus: Secondary | ICD-10-CM | POA: Diagnosis not present

## 2017-08-13 DIAGNOSIS — K219 Gastro-esophageal reflux disease without esophagitis: Secondary | ICD-10-CM | POA: Diagnosis not present

## 2017-08-13 DIAGNOSIS — Z86718 Personal history of other venous thrombosis and embolism: Secondary | ICD-10-CM | POA: Diagnosis not present

## 2017-08-13 DIAGNOSIS — K047 Periapical abscess without sinus: Secondary | ICD-10-CM | POA: Diagnosis not present

## 2017-08-13 DIAGNOSIS — R1310 Dysphagia, unspecified: Secondary | ICD-10-CM | POA: Diagnosis not present

## 2017-08-13 DIAGNOSIS — D573 Sickle-cell trait: Secondary | ICD-10-CM | POA: Diagnosis not present

## 2017-08-13 DIAGNOSIS — Z7951 Long term (current) use of inhaled steroids: Secondary | ICD-10-CM | POA: Diagnosis not present

## 2017-08-13 DIAGNOSIS — T7411XD Adult physical abuse, confirmed, subsequent encounter: Secondary | ICD-10-CM | POA: Diagnosis not present

## 2017-08-13 DIAGNOSIS — S0301XD Dislocation of jaw, right side, subsequent encounter: Secondary | ICD-10-CM | POA: Diagnosis not present

## 2017-08-15 DIAGNOSIS — R2689 Other abnormalities of gait and mobility: Secondary | ICD-10-CM | POA: Diagnosis not present

## 2017-08-15 DIAGNOSIS — J45901 Unspecified asthma with (acute) exacerbation: Secondary | ICD-10-CM | POA: Diagnosis not present

## 2017-08-15 DIAGNOSIS — S0083XA Contusion of other part of head, initial encounter: Secondary | ICD-10-CM | POA: Diagnosis not present

## 2017-08-15 DIAGNOSIS — R569 Unspecified convulsions: Secondary | ICD-10-CM | POA: Diagnosis not present

## 2017-08-15 DIAGNOSIS — G4739 Other sleep apnea: Secondary | ICD-10-CM | POA: Diagnosis not present

## 2017-08-15 DIAGNOSIS — R0602 Shortness of breath: Secondary | ICD-10-CM | POA: Diagnosis not present

## 2017-08-15 DIAGNOSIS — J45909 Unspecified asthma, uncomplicated: Secondary | ICD-10-CM | POA: Diagnosis not present

## 2017-08-20 ENCOUNTER — Ambulatory Visit (INDEPENDENT_AMBULATORY_CARE_PROVIDER_SITE_OTHER): Payer: Medicaid Other | Admitting: Physician Assistant

## 2017-08-20 ENCOUNTER — Encounter (INDEPENDENT_AMBULATORY_CARE_PROVIDER_SITE_OTHER): Payer: Self-pay | Admitting: Physician Assistant

## 2017-08-20 ENCOUNTER — Other Ambulatory Visit: Payer: Self-pay

## 2017-08-20 ENCOUNTER — Telehealth: Payer: Self-pay

## 2017-08-20 VITALS — BP 117/77 | HR 91 | Temp 98.3°F | Ht 66.0 in | Wt 174.8 lb

## 2017-08-20 DIAGNOSIS — E876 Hypokalemia: Secondary | ICD-10-CM | POA: Diagnosis not present

## 2017-08-20 DIAGNOSIS — Z760 Encounter for issue of repeat prescription: Secondary | ICD-10-CM

## 2017-08-20 DIAGNOSIS — R51 Headache: Secondary | ICD-10-CM | POA: Diagnosis not present

## 2017-08-20 DIAGNOSIS — R569 Unspecified convulsions: Secondary | ICD-10-CM

## 2017-08-20 DIAGNOSIS — K861 Other chronic pancreatitis: Secondary | ICD-10-CM

## 2017-08-20 DIAGNOSIS — R5383 Other fatigue: Secondary | ICD-10-CM | POA: Diagnosis not present

## 2017-08-20 DIAGNOSIS — R519 Headache, unspecified: Secondary | ICD-10-CM

## 2017-08-20 DIAGNOSIS — D649 Anemia, unspecified: Secondary | ICD-10-CM | POA: Diagnosis not present

## 2017-08-20 MED ORDER — DOCUSATE SODIUM 50 MG PO CAPS: 50.0000 mg | ORAL_CAPSULE | Freq: Two times a day (BID) | ORAL | 2 refills | Status: DC | PRN

## 2017-08-20 MED ORDER — TOPIRAMATE 25 MG PO TABS: 25.0000 mg | ORAL_TABLET | Freq: Every day | ORAL | 2 refills | Status: DC

## 2017-08-20 MED ORDER — DORNASE ALFA 2.5 MG/2.5ML IN SOLN: 2.5000 mg | Freq: Two times a day (BID) | RESPIRATORY_TRACT | 3 refills | Status: DC

## 2017-08-20 MED ORDER — FLUTICASONE-SALMETEROL 500-50 MCG/DOSE IN AEPB: 1.0000 | INHALATION_SPRAY | Freq: Two times a day (BID) | RESPIRATORY_TRACT | 5 refills | Status: DC

## 2017-08-20 MED ORDER — SUMATRIPTAN SUCCINATE 25 MG PO TABS: 25.0000 mg | ORAL_TABLET | Freq: Every day | ORAL | 2 refills | Status: DC

## 2017-08-20 MED ORDER — LEVETIRACETAM 750 MG PO TABS
750.0000 mg | ORAL_TABLET | Freq: Two times a day (BID) | ORAL | 2 refills | Status: DC
Start: 2017-08-20 — End: 2019-03-20

## 2017-08-20 MED ORDER — VITAMIN C 100 MG PO CHEW: 1.0000 | CHEWABLE_TABLET | Freq: Every day | ORAL | 2 refills | Status: DC

## 2017-08-20 MED ORDER — ENSURE ENLIVE PO LIQD: 1.0000 | Freq: Two times a day (BID) | ORAL | 3 refills | Status: DC

## 2017-08-20 MED ORDER — LUMACAFTOR-IVACAFTOR 150-188 MG PO PACK: 1.0000 | PACK | Freq: Two times a day (BID) | ORAL | 5 refills | Status: DC

## 2017-08-20 MED ORDER — ALBUTEROL SULFATE HFA 108 (90 BASE) MCG/ACT IN AERS: 2.0000 | INHALATION_SPRAY | RESPIRATORY_TRACT | 3 refills | Status: DC | PRN

## 2017-08-20 MED ORDER — TOBRAMYCIN 300 MG/5ML IN NEBU: 300.0000 mg | INHALATION_SOLUTION | Freq: Two times a day (BID) | RESPIRATORY_TRACT | 3 refills | Status: DC

## 2017-08-20 NOTE — Progress Notes (Signed)
Subjective:  Patient ID: Jasmine [name], female    DOB: Dec 25, 1990  Age: 27 y.o. MRN: [id number]  CC: seizures and asthma  HPI Jasmine [name] is a 27 y.o. female with a medical history of asthma, cystic fibrosis, DVT, Epilepsy, GERD, heart murmur, pancreatitis, sickle cell trait, OSA, and trismus presents as a new patient for seizures and asthma. Last seizure was yesterday. Her seizures were witnessed in [facility] hospital. Takes Keppra 500 mg BID which has reduced number of seizures. Also has severe right sided headaches with associated photophobia and visual hallucinations. Migraines often trigger seizures. Has a right sided headache now. Has not seen a neurologist in many years since leaving [place]. Has not taken anti-migraine medications. Needs a refill of all her medications including meds for Cystic Fibrosis and chronic pancreatitis. No current abdominal pain, back pain, f/c/n/v, CP, palpitations, SOB, HA, swelling, rash, or GU sxs.   Outpatient Medications Prior to Visit  Medication Sig Dispense Refill  . albuterol (PROVENTIL HFA;VENTOLIN HFA) 108 (90 Base) MCG/ACT inhaler Inhale 2 puffs into the lungs every 4 (four) hours as needed for wheezing or shortness of breath. 1 Inhaler 3  . Amylase-Lipase-Protease (CREON 20 PO) Take 2-7 capsules by mouth See admin instructions. Take 2-7 capsules by mouth with each meal and snack (dose based on amount of food intake)    . Ascorbic Acid (VITAMIN C PO) Take 1 tablet by mouth daily.    . Aztreonam Lysine 75 MG SOLR Take 75 mg by nebulization 3 (three) times daily.    Mariane Baumgarten Sodium (COLACE PO) Take 30 mLs by mouth daily.    Marland Kitchen dornase alpha (PULMOZYME) 1 MG/ML nebulizer solution Take 2.5 mg by nebulization 2 (two) times daily.     . feeding supplement, ENSURE ENLIVE, (ENSURE ENLIVE) LIQD Take 474 mLs by mouth 3 (three) times daily between meals. 474 mL 3 times a day, provide 3-week supply 237 mL 120  . fluticasone (FLONASE) 50 MCG/ACT nasal  spray Place 1 spray into both nostrils daily.    . fluticasone (FLOVENT HFA) 44 MCG/ACT inhaler Inhale 1 puff into the lungs 2 (two) times daily.    . Fluticasone-Salmeterol (ADVAIR) 500-50 MCG/DOSE AEPB Inhale 1 puff into the lungs 2 (two) times daily.    Marland Kitchen HYDROcodone-acetaminophen (NORCO/VICODIN) 5-325 MG tablet Take 1 tablet by mouth every 6 (six) hours as needed for moderate pain (pain). 20 tablet 0  . ibuprofen (ADVIL,MOTRIN) 800 MG tablet Take 800 mg by mouth every 8 (eight) hours as needed.    . levETIRAcetam (KEPPRA) 500 MG tablet Take 1 tablet (500 mg total) by mouth 2 (two) times daily. 60 tablet 1  . LUMACAFTOR-IVACAFTOR PO Take by nebulization 2 (two) times daily.    . montelukast (SINGULAIR) 10 MG tablet Take 10 mg by mouth at bedtime.    . Multiple Vitamin (MULTIVITAMIN WITH MINERALS) TABS tablet Take 2 tablets by mouth daily.    Marland Kitchen tobramycin, PF, (TOBI) 300 MG/5ML nebulizer solution Take 300 mg by nebulization every 12 (twelve) hours.    . Alum & Mag Hydroxide-Simeth (GI COCKTAIL) SUSP suspension Take 30 mLs by mouth 2 (two) times daily as needed for indigestion. Shake well.    . clindamycin (CLEOCIN) 75 MG/5ML solution Take 20 mLs (300 mg total) by mouth 3 (three) times daily. 420 mL 0   No facility-administered medications prior to visit.      ROS Review of Systems  Constitutional: Negative for chills, fever and malaise/fatigue.  Eyes: Negative for  blurred vision.  Respiratory: Negative for shortness of breath.   Cardiovascular: Negative for chest pain and palpitations.  Gastrointestinal: Negative for abdominal pain and nausea.  Genitourinary: Negative for dysuria and hematuria.  Musculoskeletal: Negative for joint pain and myalgias.  Skin: Negative for rash.  Neurological: Positive for headaches. Negative for tingling.  Psychiatric/Behavioral: Negative for depression. The patient is not nervous/anxious.     Objective:  BP 117/77 (BP Location: Left Arm, Patient  Position: Sitting, Cuff Size: Normal)   Pulse 91   Temp 98.3 F (36.8 C) (Oral)   Ht 5\' 6"  (1.676 m)   Wt 174 lb 12.8 oz (79.3 kg)   LMP 08/15/2017 (Exact Date)   SpO2 98%   BMI 28.21 kg/m   BP/Weight 08/20/2017 6/78/9381 0/01/7508  Systolic BP 258 527 782  Diastolic BP 77 84 82  Wt. (Lbs) 174.8 - -  BMI 28.21 - -      Physical Exam  Constitutional: She is oriented to person, place, and time.  Well developed, well nourished, holding right side of head, polite  HENT:  Head: Normocephalic and atraumatic.  Eyes: Pupils are equal, round, and reactive to light. Conjunctivae and EOM are normal. No scleral icterus.  Neck: Normal range of motion. Neck supple. No thyromegaly present.  Cardiovascular: Normal rate, regular rhythm and normal heart sounds.  Pulmonary/Chest: Effort normal.  Mild rhonchi diffusely  Abdominal: Soft. Bowel sounds are normal. There is no tenderness.  Musculoskeletal: She exhibits no edema.  Lymphadenopathy:    She has no cervical adenopathy.  Neurological: She is alert and oriented to person, place, and time. No cranial nerve deficit. Coordination normal.  Skin: Skin is warm and dry. No rash noted. No erythema. No pallor.  Psychiatric: Her behavior is normal. Thought content normal.  Depressed mood  Vitals reviewed.    Assessment & Plan:    1. Seizures ([place]) - Ambulatory referral to Neurology - Increase levETIRAcetam (KEPPRA) 750 MG tablet; Take 1 tablet (750 mg total) by mouth 2 (two) times daily.  Dispense: 60 tablet; Refill: 2 - TSH  2. Hypokalemia - Comprehensive metabolic panel  3. Anemia, unspecified type - CBC with Differential  4. Fatigue, unspecified type - TSH  5. 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  Dispense: 56 each; Refill: 5 - Refill tobramycin, PF, (TOBI) 300 MG/5ML nebulizer solution; Take 5 mLs (300 mg total)  by nebulization every 12 (twelve) hours.  Dispense: 280 mL; Refill: 3 - Refill Fluticasone-Salmeterol (ADVAIR) 500-50 MCG/DOSE AEPB; Inhale 1 puff into the lungs 2 (two) times daily.  Dispense: 1 each; Refill: 5 - Refill dornase alpha (PULMOZYME) 1 MG/ML nebulizer solution; Take 2.5 mLs (2.5 mg total) by nebulization 2 (two) times daily.  Dispense: 75 mL; Refill: 3 - Refill albuterol (PROVENTIL HFA;VENTOLIN HFA) 108 (90 Base) MCG/ACT inhaler; Inhale 2 puffs into the lungs every 4 (four) hours as needed for wheezing or shortness of breath.  Dispense: 1 Inhaler; Refill: 3  6. Chronic pancreatitis, unspecified pancreatitis type ([place]) - Lipase - Ambulatory referral to Gastroenterology - Pt reports a customized dose of Creon which does not coincide with available dosing. I have asked her to call original prescriber for a refill of her customized Creon. Patient said she still has access to original prescriber and will call for a refill.   7. Medication refill - feeding supplement, ENSURE ENLIVE, (ENSURE ENLIVE) LIQD; Take 237 mLs by mouth  2 (two) times daily between meals. 474 mL 3 times a day, provide 3-week supply  Dispense: 60 Bottle; Refill: 3 - docusate sodium (COLACE) 50 MG capsule; Take 1 capsule (50 mg total) by mouth 2 (two) times daily as needed for mild constipation.  Dispense: 60 capsule; Refill: 2 - Ascorbic Acid (VITAMIN C) 100 MG CHEW; Chew 1 tablet (100 mg total) by mouth daily.  Dispense: 90 each; Refill: 2  8. Nonintractable headache, unspecified chronicity pattern, unspecified headache type - SUMAtriptan (IMITREX) 25 MG tablet; Take 1 tablet (25 mg total) by mouth daily. May take one more tablet two hours after the first. No more than two tablets per day.  Dispense: 30 tablet; Refill: 2 - topiramate (TOPAMAX) 25 MG tablet; Take 1 tablet (25 mg total) by mouth at bedtime.  Dispense: 30 tablet; Refill: 2   Meds ordered this encounter  Medications  . levETIRAcetam (KEPPRA) 750 MG  tablet    Sig: Take 1 tablet (750 mg total) by mouth 2 (two) times daily.    Dispense:  60 tablet    Refill:  2    Order Specific Question:   Supervising Provider    Answer:   Charlott Rakes [[id number]]  . Lumacaftor-Ivacaftor 150-188 MG PACK    Sig: Take 1 each by nebulization 2 (two) times daily.    Dispense:  56 each    Refill:  5    Order Specific Question:   Supervising Provider    Answer:   Charlott Rakes [[id number]]  . tobramycin, PF, (TOBI) 300 MG/5ML nebulizer solution    Sig: Take 5 mLs (300 mg total) by nebulization every 12 (twelve) hours.    Dispense:  280 mL    Refill:  3    Order Specific Question:   Supervising Provider    Answer:   Charlott Rakes [[id number]]  . Fluticasone-Salmeterol (ADVAIR) 500-50 MCG/DOSE AEPB    Sig: Inhale 1 puff into the lungs 2 (two) times daily.    Dispense:  1 each    Refill:  5    Order Specific Question:   Supervising Provider    Answer:   Charlott Rakes [[id number]]  . feeding supplement, ENSURE ENLIVE, (ENSURE ENLIVE) LIQD    Sig: Take 237 mLs by mouth 2 (two) times daily between meals. 474 mL 3 times a day, provide 3-week supply    Dispense:  60 Bottle    Refill:  3    Order Specific Question:   Supervising Provider    Answer:   Charlott Rakes [[id number]]  . dornase alpha (PULMOZYME) 1 MG/ML nebulizer solution    Sig: Take 2.5 mLs (2.5 mg total) by nebulization 2 (two) times daily.    Dispense:  75 mL    Refill:  3    Order Specific Question:   Supervising Provider    Answer:   Charlott Rakes [[id number]]  . docusate sodium (COLACE) 50 MG capsule    Sig: Take 1 capsule (50 mg total) by mouth 2 (two) times daily as needed for mild constipation.    Dispense:  60 capsule    Refill:  2    Order Specific Question:   Supervising Provider    Answer:   Charlott Rakes [[id number]]  . Ascorbic Acid (VITAMIN C) 100 MG CHEW    Sig: Chew 1 tablet (100 mg total) by mouth daily.    Dispense:  90 each    Refill:  2    Order Specific Question:   Supervising Provider  Answer:   Charlott Rakes [[id number]]  . albuterol (PROVENTIL HFA;VENTOLIN HFA) 108 (90 Base) MCG/ACT inhaler    Sig: Inhale 2 puffs into the lungs every 4 (four) hours as needed for wheezing or shortness of breath.    Dispense:  1 Inhaler    Refill:  3    Order Specific Question:   Supervising Provider    Answer:   Charlott Rakes [[id number]]  . SUMAtriptan (IMITREX) 25 MG tablet    Sig: Take 1 tablet (25 mg total) by mouth daily. May take one more tablet two hours after the first. No more than two tablets per day.    Dispense:  30 tablet    Refill:  2    Order Specific Question:   Supervising Provider    Answer:   Charlott Rakes [[id number]]  . topiramate (TOPAMAX) 25 MG tablet    Sig: Take 1 tablet (25 mg total) by mouth at bedtime.    Dispense:  30 tablet    Refill:  2    Order Specific Question:   Supervising Provider    Answer:   Charlott Rakes [[id number]]    Follow-up: 8 weeks headache after consults.  Clent Demark PA

## 2017-08-20 NOTE — Telephone Encounter (Signed)
Worked on finding brand name Tobramycin at a pharmacy for her, it appears to be a specialty drug. Updated her PCP Jasmine [name] of the situation he will be handling it now.

## 2017-08-20 NOTE — Patient Instructions (Signed)
Seizure, Adult A seizure is a sudden burst of abnormal electrical activity in the brain. The abnormal activity temporarily interrupts normal brain function, causing a person to experience any of the following:  Involuntary movements.  Changes in awareness or consciousness.  Uncontrollable shaking (convulsions).  Seizures usually last from 30 seconds to 2 minutes. They usually do not cause permanent brain damage unless they are prolonged. What can cause a seizure to happen? Seizures can happen for many reasons including:  A fever.  Low blood sugar.  A medicine.  An illnesses.  A brain injury.  Some people who have a seizure never have another one. People who have repeated seizures have a condition called epilepsy. What are the symptoms of a seizure? Symptoms of a seizure vary greatly from person to person. They include:  Convulsions.  Stiffening of the body.  Involuntary movements of the arms or legs.  Loss of consciousness.  Breathing problems.  Falling suddenly.  Confusion.  Head nodding.  Eye blinking or fluttering.  Lip smacking.  Drooling.  Rapid eye movements.  Grunting.  Loss of bladder control and bowel control.  Staring.  Unresponsiveness.  Some people have symptoms right before a seizure happens (aura) and right after a seizure happens. Symptoms of an aura include:  Fear or anxiety.  Nausea.  Feeling like the room is spinning (vertigo).  A feeling of having seen or heard something before (deja vu).  Odd tastes or smells.  Changes in vision, such as seeing flashing lights or spots.  Symptoms that may follow a seizure include:  Confusion.  Sleepiness.  Headache.  Weakness of one side of the body.  Follow these instructions at home: Medicines   Take over-the-counter and prescription medicines only as told by your health care provider.  Avoid any substances that may prevent your medicine from working properly, such as  alcohol. Activity  Do not drive, swim, or do any other activities that would be dangerous if you had another seizure. Wait until your health care provider approves.  If you live in the [place] check with your local DMV (department of motor vehicles) to find out about the local driving laws. Each state has specific rules about when you can legally return to driving.  Get enough rest. Lack of sleep can make seizures more likely to occur. Educating others Teach friends and family what to do if you have a seizure. They should:  Lay you on the ground to prevent a fall.  Cushion your head and body.  Loosen any tight clothing around your neck.  Turn you on your side. If vomiting occurs, this helps keep your airway clear.  Stay with you until you recover.  Not hold you down. Holding you down will not stop the seizure.  Not put anything in your mouth.  Know whether or not you need emergency care.  General instructions  Contact your health care provider each time you have a seizure.  Avoid anything that has ever triggered a seizure for you.  Keep a seizure diary. Record what you remember about each seizure, especially anything that might have triggered the seizure.  Keep all follow-up visits as told by your health care provider. This is important. Contact a health care provider if:  You have another seizure.  You have seizures more often.  Your seizure symptoms change.  You continue to have seizures with treatment.  You have symptoms of an infection or illness. They might increase your risk of having a seizure. Get help   right away if:  You have a seizure: ? That lasts longer than 5 minutes. ? That is different than previous seizures. ? That leaves you unable to speak or use a part of your body. ? That makes it harder to breathe. ? After a head injury.  You have: ? Multiple seizures in a row. ? Confusion or a severe headache right after a seizure.  You are having  seizures more often.  You do not wake up immediately after a seizure.  You injure yourself during a seizure. These symptoms may represent a serious problem that is an emergency. Do not wait to see if the symptoms will go away. Get medical help right away. Call your local emergency services (911 in the [place] Do not drive yourself to the hospital. This information is not intended to replace advice given to you by your health care provider. Make sure you discuss any questions you have with your health care provider. Document Released: 12/30/1999 Document Revised: 08/28/2015 Document Reviewed: 08/05/2015 Elsevier Interactive Patient Education  2018 [employer] Inc[employer]  

## 2017-08-21 ENCOUNTER — Telehealth (INDEPENDENT_AMBULATORY_CARE_PROVIDER_SITE_OTHER): Payer: Self-pay | Admitting: Physician Assistant

## 2017-08-21 DIAGNOSIS — S0301XD Dislocation of jaw, right side, subsequent encounter: Secondary | ICD-10-CM | POA: Diagnosis not present

## 2017-08-21 DIAGNOSIS — G40909 Epilepsy, unspecified, not intractable, without status epilepticus: Secondary | ICD-10-CM | POA: Diagnosis not present

## 2017-08-21 DIAGNOSIS — D573 Sickle-cell trait: Secondary | ICD-10-CM | POA: Diagnosis not present

## 2017-08-21 DIAGNOSIS — Z7951 Long term (current) use of inhaled steroids: Secondary | ICD-10-CM | POA: Diagnosis not present

## 2017-08-21 DIAGNOSIS — J45909 Unspecified asthma, uncomplicated: Secondary | ICD-10-CM | POA: Diagnosis not present

## 2017-08-21 DIAGNOSIS — K219 Gastro-esophageal reflux disease without esophagitis: Secondary | ICD-10-CM | POA: Diagnosis not present

## 2017-08-21 DIAGNOSIS — F329 Major depressive disorder, single episode, unspecified: Secondary | ICD-10-CM | POA: Diagnosis not present

## 2017-08-21 DIAGNOSIS — K047 Periapical abscess without sinus: Secondary | ICD-10-CM | POA: Diagnosis not present

## 2017-08-21 DIAGNOSIS — R1310 Dysphagia, unspecified: Secondary | ICD-10-CM | POA: Diagnosis not present

## 2017-08-21 DIAGNOSIS — T7411XD Adult physical abuse, confirmed, subsequent encounter: Secondary | ICD-10-CM | POA: Diagnosis not present

## 2017-08-21 DIAGNOSIS — Z86718 Personal history of other venous thrombosis and embolism: Secondary | ICD-10-CM | POA: Diagnosis not present

## 2017-08-21 LAB — COMPREHENSIVE METABOLIC PANEL
A/G RATIO: 1.6 (ref 1.2–2.2)
ALK PHOS: 73 IU/L (ref 39–117)
ALT: 18 IU/L (ref 0–32)
AST: 23 IU/L (ref 0–40)
Albumin: 4.4 g/dL (ref 3.5–5.5)
BUN/Creatinine Ratio: 22 (ref 9–23)
BUN: 10 mg/dL (ref 6–20)
CHLORIDE: 104 mmol/L (ref 96–106)
CO2: 21 mmol/L (ref 20–29)
Calcium: 9.4 mg/dL (ref 8.7–10.2)
Creatinine, Ser: 0.45 mg/dL — ABNORMAL LOW (ref 0.57–1.00)
GFR calc Af Amer: 160 mL/min/{1.73_m2} (ref >59)
GFR calc non Af Amer: 139 mL/min/{1.73_m2} (ref >59)
Globulin, Total: 2.7 g/dL (ref 1.5–4.5)
Glucose: 94 mg/dL (ref 65–99)
POTASSIUM: 4.2 mmol/L (ref 3.5–5.2)
Sodium: 141 mmol/L (ref 134–144)
TOTAL PROTEIN: 7.1 g/dL (ref 6.0–8.5)

## 2017-08-21 LAB — CBC WITH DIFFERENTIAL/PLATELET
BASOS ABS: 0 10*3/uL (ref 0.0–0.2)
Basos: 0 %
EOS (ABSOLUTE): 0.3 10*3/uL (ref 0.0–0.4)
Eos: 3 %
HEMOGLOBIN: 11.9 g/dL (ref 11.1–15.9)
Hematocrit: 38 % (ref 34.0–46.6)
IMMATURE GRANS (ABS): 0 10*3/uL (ref 0.0–0.1)
IMMATURE GRANULOCYTES: 0 %
LYMPHS: 37 %
Lymphocytes Absolute: 3.1 10*3/uL (ref 0.7–3.1)
MCH: 27.2 pg (ref 26.6–33.0)
MCHC: 31.3 g/dL — ABNORMAL LOW (ref 31.5–35.7)
MCV: 87 fL (ref 79–97)
MONOCYTES: 4 %
Monocytes Absolute: 0.3 10*3/uL (ref 0.1–0.9)
NEUTROS ABS: 4.6 10*3/uL (ref 1.4–7.0)
NEUTROS PCT: 56 %
PLATELETS: 294 10*3/uL (ref 150–450)
RBC: 4.37 x10E6/uL (ref 3.77–5.28)
RDW: 14.3 % (ref 12.3–15.4)
WBC: 8.4 10*3/uL (ref 3.4–10.8)

## 2017-08-21 LAB — TSH: TSH: 3.93 u[IU]/mL (ref 0.450–4.500)

## 2017-08-21 LAB — LIPASE: LIPASE: 34 U/L (ref 14–72)

## 2017-08-21 NOTE — Telephone Encounter (Signed)
FWD to PCP. Caliph Borowiak S Brando Taves, CMA  

## 2017-08-21 NOTE — Telephone Encounter (Signed)
Ebony Hail Speech therapist from [facility] called to inform that she recommends to do a referral for modified barium swallow study. Ebony Hail provided a number [phone] to schedule an appointment and a fax number [phone]. Ebony Hail also recommends to do a referral for Ms.Alfredo to go to Outpatient Physical therapy and to speech therapy at the [facility].    Please advice [phone].  Thank you Emmit Pomfret

## 2017-08-22 ENCOUNTER — Other Ambulatory Visit (INDEPENDENT_AMBULATORY_CARE_PROVIDER_SITE_OTHER): Payer: Self-pay | Admitting: Physician Assistant

## 2017-08-22 DIAGNOSIS — R1310 Dysphagia, unspecified: Secondary | ICD-10-CM

## 2017-08-22 NOTE — Telephone Encounter (Signed)
I have put in all the order requests. I have forwarded to you so that you know who to contact in case of any issues. Thanks.

## 2017-08-22 NOTE — Progress Notes (Unsigned)
barium

## 2017-08-23 ENCOUNTER — Telehealth (INDEPENDENT_AMBULATORY_CARE_PROVIDER_SITE_OTHER): Payer: Self-pay

## 2017-08-23 ENCOUNTER — Other Ambulatory Visit (HOSPITAL_COMMUNITY): Payer: Self-pay | Admitting: Physician Assistant

## 2017-08-23 DIAGNOSIS — Z7951 Long term (current) use of inhaled steroids: Secondary | ICD-10-CM | POA: Diagnosis not present

## 2017-08-23 DIAGNOSIS — K219 Gastro-esophageal reflux disease without esophagitis: Secondary | ICD-10-CM | POA: Diagnosis not present

## 2017-08-23 DIAGNOSIS — D573 Sickle-cell trait: Secondary | ICD-10-CM | POA: Diagnosis not present

## 2017-08-23 DIAGNOSIS — Z86718 Personal history of other venous thrombosis and embolism: Secondary | ICD-10-CM | POA: Diagnosis not present

## 2017-08-23 DIAGNOSIS — F329 Major depressive disorder, single episode, unspecified: Secondary | ICD-10-CM | POA: Diagnosis not present

## 2017-08-23 DIAGNOSIS — G40909 Epilepsy, unspecified, not intractable, without status epilepticus: Secondary | ICD-10-CM | POA: Diagnosis not present

## 2017-08-23 DIAGNOSIS — S0301XD Dislocation of jaw, right side, subsequent encounter: Secondary | ICD-10-CM | POA: Diagnosis not present

## 2017-08-23 DIAGNOSIS — J45909 Unspecified asthma, uncomplicated: Secondary | ICD-10-CM | POA: Diagnosis not present

## 2017-08-23 DIAGNOSIS — K047 Periapical abscess without sinus: Secondary | ICD-10-CM | POA: Diagnosis not present

## 2017-08-23 DIAGNOSIS — T7411XD Adult physical abuse, confirmed, subsequent encounter: Secondary | ICD-10-CM | POA: Diagnosis not present

## 2017-08-23 DIAGNOSIS — R1310 Dysphagia, unspecified: Secondary | ICD-10-CM | POA: Diagnosis not present

## 2017-08-23 NOTE — Telephone Encounter (Signed)
Noted  

## 2017-08-23 NOTE — Telephone Encounter (Signed)
Patient is aware of normal labs. Nat Christen, CMA

## 2017-08-23 NOTE — Telephone Encounter (Signed)
-----   Message from Clent Demark, PA-C sent at 08/21/2017  5:53 PM EDT ----- Normal labs.

## 2017-08-23 NOTE — Telephone Encounter (Signed)
Good Morning   I spoke to Jasmine [name] for the Barium Swallow test she will contact the patient to schedule an appointment and I send the referral for Speech Therapy to [facility] . They will contact the patient to schedule an appointment .

## 2017-08-29 ENCOUNTER — Ambulatory Visit: Payer: Self-pay | Admitting: Family Medicine

## 2017-09-04 ENCOUNTER — Ambulatory Visit (HOSPITAL_COMMUNITY)
Admission: RE | Admit: 2017-09-04 | Discharge: 2017-09-04 | Disposition: A | Discharge status: Home / Self Care | Payer: Medicaid Other | Source: Ambulatory Visit | Attending: Physician Assistant | Admitting: Physician Assistant

## 2017-09-04 ENCOUNTER — Ambulatory Visit (HOSPITAL_COMMUNITY): Payer: Self-pay

## 2017-09-04 DIAGNOSIS — R1310 Dysphagia, unspecified: Secondary | ICD-10-CM

## 2017-09-15 DIAGNOSIS — R569 Unspecified convulsions: Secondary | ICD-10-CM | POA: Diagnosis not present

## 2017-09-15 DIAGNOSIS — J45901 Unspecified asthma with (acute) exacerbation: Secondary | ICD-10-CM | POA: Diagnosis not present

## 2017-09-15 DIAGNOSIS — R0602 Shortness of breath: Secondary | ICD-10-CM | POA: Diagnosis not present

## 2017-09-15 DIAGNOSIS — J45909 Unspecified asthma, uncomplicated: Secondary | ICD-10-CM | POA: Diagnosis not present

## 2017-09-15 DIAGNOSIS — G4739 Other sleep apnea: Secondary | ICD-10-CM | POA: Diagnosis not present

## 2017-09-15 DIAGNOSIS — S0083XA Contusion of other part of head, initial encounter: Secondary | ICD-10-CM | POA: Diagnosis not present

## 2017-09-15 DIAGNOSIS — R2689 Other abnormalities of gait and mobility: Secondary | ICD-10-CM | POA: Diagnosis not present

## 2017-10-15 DIAGNOSIS — R2689 Other abnormalities of gait and mobility: Secondary | ICD-10-CM | POA: Diagnosis not present

## 2017-10-15 DIAGNOSIS — J45909 Unspecified asthma, uncomplicated: Secondary | ICD-10-CM | POA: Diagnosis not present

## 2017-10-15 DIAGNOSIS — R569 Unspecified convulsions: Secondary | ICD-10-CM | POA: Diagnosis not present

## 2017-10-15 DIAGNOSIS — R0602 Shortness of breath: Secondary | ICD-10-CM | POA: Diagnosis not present

## 2017-10-15 DIAGNOSIS — G4739 Other sleep apnea: Secondary | ICD-10-CM | POA: Diagnosis not present

## 2017-10-15 DIAGNOSIS — S0083XA Contusion of other part of head, initial encounter: Secondary | ICD-10-CM | POA: Diagnosis not present

## 2017-10-15 DIAGNOSIS — J45901 Unspecified asthma with (acute) exacerbation: Secondary | ICD-10-CM | POA: Diagnosis not present

## 2017-10-17 ENCOUNTER — Ambulatory Visit (INDEPENDENT_AMBULATORY_CARE_PROVIDER_SITE_OTHER): Payer: Self-pay | Admitting: Physician Assistant

## 2017-10-23 ENCOUNTER — Encounter: Payer: Self-pay | Admitting: Physician Assistant

## 2017-10-23 ENCOUNTER — Institutional Professional Consult (permissible substitution): Payer: Self-pay | Admitting: Pulmonary Disease

## 2017-11-15 DIAGNOSIS — J45901 Unspecified asthma with (acute) exacerbation: Secondary | ICD-10-CM | POA: Diagnosis not present

## 2017-11-15 DIAGNOSIS — G4739 Other sleep apnea: Secondary | ICD-10-CM | POA: Diagnosis not present

## 2017-11-15 DIAGNOSIS — R2689 Other abnormalities of gait and mobility: Secondary | ICD-10-CM | POA: Diagnosis not present

## 2017-11-15 DIAGNOSIS — J45909 Unspecified asthma, uncomplicated: Secondary | ICD-10-CM | POA: Diagnosis not present

## 2017-11-15 DIAGNOSIS — R0602 Shortness of breath: Secondary | ICD-10-CM | POA: Diagnosis not present

## 2017-11-15 DIAGNOSIS — S0083XA Contusion of other part of head, initial encounter: Secondary | ICD-10-CM | POA: Diagnosis not present

## 2017-11-15 DIAGNOSIS — R569 Unspecified convulsions: Secondary | ICD-10-CM | POA: Diagnosis not present

## 2017-12-01 ENCOUNTER — Other Ambulatory Visit: Payer: Self-pay

## 2017-12-01 ENCOUNTER — Encounter (HOSPITAL_COMMUNITY): Payer: Self-pay | Admitting: Emergency Medicine

## 2017-12-01 ENCOUNTER — Emergency Department (HOSPITAL_COMMUNITY): Payer: Medicaid Other

## 2017-12-01 ENCOUNTER — Inpatient Hospital Stay (HOSPITAL_COMMUNITY)
Admission: EM | Admit: 2017-12-01 | Discharge: 2017-12-02 | DRG: 202 | Disposition: A | Discharge status: Home / Self Care | Payer: Medicaid Other | Attending: Pulmonary Disease | Admitting: Pulmonary Disease

## 2017-12-01 DIAGNOSIS — Z91040 Latex allergy status: Secondary | ICD-10-CM

## 2017-12-01 DIAGNOSIS — Z825 Family history of asthma and other chronic lower respiratory diseases: Secondary | ICD-10-CM

## 2017-12-01 DIAGNOSIS — R0602 Shortness of breath: Secondary | ICD-10-CM | POA: Diagnosis not present

## 2017-12-01 DIAGNOSIS — Z79899 Other long term (current) drug therapy: Secondary | ICD-10-CM

## 2017-12-01 DIAGNOSIS — Z86718 Personal history of other venous thrombosis and embolism: Secondary | ICD-10-CM | POA: Diagnosis not present

## 2017-12-01 DIAGNOSIS — J9622 Acute and chronic respiratory failure with hypercapnia: Secondary | ICD-10-CM | POA: Diagnosis present

## 2017-12-01 DIAGNOSIS — J9621 Acute and chronic respiratory failure with hypoxia: Secondary | ICD-10-CM | POA: Diagnosis present

## 2017-12-01 DIAGNOSIS — Z91018 Allergy to other foods: Secondary | ICD-10-CM

## 2017-12-01 DIAGNOSIS — Z91010 Allergy to peanuts: Secondary | ICD-10-CM | POA: Diagnosis not present

## 2017-12-01 DIAGNOSIS — K219 Gastro-esophageal reflux disease without esophagitis: Secondary | ICD-10-CM | POA: Diagnosis present

## 2017-12-01 DIAGNOSIS — Z832 Family history of diseases of the blood and blood-forming organs and certain disorders involving the immune mechanism: Secondary | ICD-10-CM | POA: Diagnosis not present

## 2017-12-01 DIAGNOSIS — Z886 Allergy status to analgesic agent status: Secondary | ICD-10-CM

## 2017-12-01 DIAGNOSIS — D573 Sickle-cell trait: Secondary | ICD-10-CM | POA: Diagnosis present

## 2017-12-01 DIAGNOSIS — G43009 Migraine without aura, not intractable, without status migrainosus: Secondary | ICD-10-CM | POA: Diagnosis present

## 2017-12-01 DIAGNOSIS — Z9981 Dependence on supplemental oxygen: Secondary | ICD-10-CM | POA: Diagnosis not present

## 2017-12-01 DIAGNOSIS — Z7951 Long term (current) use of inhaled steroids: Secondary | ICD-10-CM

## 2017-12-01 DIAGNOSIS — Z888 Allergy status to other drugs, medicaments and biological substances status: Secondary | ICD-10-CM | POA: Diagnosis not present

## 2017-12-01 DIAGNOSIS — R569 Unspecified convulsions: Secondary | ICD-10-CM | POA: Diagnosis present

## 2017-12-01 DIAGNOSIS — G4733 Obstructive sleep apnea (adult) (pediatric): Secondary | ICD-10-CM | POA: Diagnosis present

## 2017-12-01 DIAGNOSIS — J9602 Acute respiratory failure with hypercapnia: Secondary | ICD-10-CM | POA: Diagnosis not present

## 2017-12-01 DIAGNOSIS — Z880 Allergy status to penicillin: Secondary | ICD-10-CM | POA: Diagnosis not present

## 2017-12-01 DIAGNOSIS — Z23 Encounter for immunization: Secondary | ICD-10-CM

## 2017-12-01 DIAGNOSIS — J9601 Acute respiratory failure with hypoxia: Secondary | ICD-10-CM

## 2017-12-01 DIAGNOSIS — K8681 Exocrine pancreatic insufficiency: Secondary | ICD-10-CM | POA: Diagnosis present

## 2017-12-01 DIAGNOSIS — Z91041 Radiographic dye allergy status: Secondary | ICD-10-CM

## 2017-12-01 DIAGNOSIS — J4551 Severe persistent asthma with (acute) exacerbation: Secondary | ICD-10-CM

## 2017-12-01 DIAGNOSIS — Z87891 Personal history of nicotine dependence: Secondary | ICD-10-CM | POA: Diagnosis not present

## 2017-12-01 DIAGNOSIS — Z881 Allergy status to other antibiotic agents status: Secondary | ICD-10-CM

## 2017-12-01 DIAGNOSIS — J45901 Unspecified asthma with (acute) exacerbation: Secondary | ICD-10-CM | POA: Diagnosis present

## 2017-12-01 LAB — BRAIN NATRIURETIC PEPTIDE: B NATRIURETIC PEPTIDE 5: 22.2 pg/mL (ref 0.0–100.0)

## 2017-12-01 LAB — CBC WITH DIFFERENTIAL/PLATELET
Abs Immature Granulocytes: 0.06 10*3/uL (ref 0.00–0.07)
BASOS PCT: 0 %
Basophils Absolute: 0.1 10*3/uL (ref 0.0–0.1)
Eosinophils Absolute: 0.3 10*3/uL (ref 0.0–0.5)
Eosinophils Relative: 2 %
HEMATOCRIT: 40.4 % (ref 36.0–46.0)
Hemoglobin: 12.6 g/dL (ref 12.0–15.0)
IMMATURE GRANULOCYTES: 0 %
Lymphocytes Relative: 35 %
Lymphs Abs: 5.5 10*3/uL — ABNORMAL HIGH (ref 0.7–4.0)
MCH: 26.8 pg (ref 26.0–34.0)
MCHC: 31.2 g/dL (ref 30.0–36.0)
MCV: 86 fL (ref 80.0–100.0)
MONO ABS: 1 10*3/uL (ref 0.1–1.0)
MONOS PCT: 6 %
Neutro Abs: 9.1 10*3/uL — ABNORMAL HIGH (ref 1.7–7.7)
Neutrophils Relative %: 57 %
Platelets: 285 10*3/uL (ref 150–400)
RBC: 4.7 MIL/uL (ref 3.87–5.11)
RDW: 14.8 % (ref 11.5–15.5)
WBC: 16 10*3/uL — ABNORMAL HIGH (ref 4.0–10.5)
nRBC: 0 % (ref 0.0–0.2)

## 2017-12-01 LAB — RAPID URINE DRUG SCREEN, HOSP PERFORMED
AMPHETAMINES: NOT DETECTED
BENZODIAZEPINES: NOT DETECTED
Barbiturates: NOT DETECTED
COCAINE: NOT DETECTED
Opiates: NOT DETECTED
Tetrahydrocannabinol: POSITIVE — AB

## 2017-12-01 LAB — I-STAT ARTERIAL BLOOD GAS, ED
Bicarbonate: 27.1 mmol/L (ref 20.0–28.0)
O2 SAT: 100 %
PO2 ART: 263 mmHg — AB (ref 83.0–108.0)
TCO2: 29 mmol/L (ref 22–32)
pCO2 arterial: 55.8 mmHg — ABNORMAL HIGH (ref 32.0–48.0)
pH, Arterial: 7.295 — ABNORMAL LOW (ref 7.350–7.450)

## 2017-12-01 LAB — RESPIRATORY PANEL BY PCR
ADENOVIRUS-RVPPCR: NOT DETECTED
Bordetella pertussis: NOT DETECTED
CORONAVIRUS 229E-RVPPCR: NOT DETECTED
CORONAVIRUS HKU1-RVPPCR: NOT DETECTED
CORONAVIRUS NL63-RVPPCR: NOT DETECTED
CORONAVIRUS OC43-RVPPCR: NOT DETECTED
Chlamydophila pneumoniae: NOT DETECTED
INFLUENZA B-RVPPCR: NOT DETECTED
Influenza A: NOT DETECTED
MYCOPLASMA PNEUMONIAE-RVPPCR: NOT DETECTED
Metapneumovirus: NOT DETECTED
PARAINFLUENZA VIRUS 1-RVPPCR: NOT DETECTED
Parainfluenza Virus 2: NOT DETECTED
Parainfluenza Virus 3: NOT DETECTED
Parainfluenza Virus 4: NOT DETECTED
Respiratory Syncytial Virus: NOT DETECTED
Rhinovirus / Enterovirus: NOT DETECTED

## 2017-12-01 LAB — COMPREHENSIVE METABOLIC PANEL
ALT: 19 U/L (ref 0–44)
AST: 25 U/L (ref 15–41)
Albumin: 4.6 g/dL (ref 3.5–5.0)
Alkaline Phosphatase: 64 U/L (ref 38–126)
Anion gap: 14 (ref 5–15)
BILIRUBIN TOTAL: 0.4 mg/dL (ref 0.3–1.2)
BUN: 8 mg/dL (ref 6–20)
CO2: 21 mmol/L — ABNORMAL LOW (ref 22–32)
Calcium: 9.6 mg/dL (ref 8.9–10.3)
Chloride: 101 mmol/L (ref 98–111)
Creatinine, Ser: 0.76 mg/dL (ref 0.44–1.00)
GFR calc Af Amer: >60 mL/min (ref >60)
GFR calc non Af Amer: >60 mL/min (ref >60)
Glucose, Bld: 95 mg/dL (ref 70–99)
Potassium: 3.5 mmol/L (ref 3.5–5.1)
Sodium: 136 mmol/L (ref 135–145)
TOTAL PROTEIN: 7.8 g/dL (ref 6.5–8.1)

## 2017-12-01 LAB — D-DIMER, QUANTITATIVE (NOT AT ARMC): D DIMER QUANT: 0.35 ug{FEU}/mL (ref 0.00–0.50)

## 2017-12-01 LAB — I-STAT BETA HCG BLOOD, ED (MC, WL, AP ONLY)

## 2017-12-01 LAB — LACTIC ACID, PLASMA: LACTIC ACID, VENOUS: 2.7 mmol/L — AB (ref 0.5–1.9)

## 2017-12-01 LAB — I-STAT CG4 LACTIC ACID, ED: LACTIC ACID, VENOUS: 2.05 mmol/L — AB (ref 0.5–1.9)

## 2017-12-01 LAB — GLUCOSE, CAPILLARY: Glucose-Capillary: 103 mg/dL — ABNORMAL HIGH (ref 70–99)

## 2017-12-01 LAB — MRSA PCR SCREENING: MRSA BY PCR: NEGATIVE

## 2017-12-01 LAB — TROPONIN I

## 2017-12-01 MED ORDER — VANCOMYCIN HCL 10 G IV SOLR
1500.0000 mg | Freq: Once | INTRAVENOUS | Status: AC
  Administered 2017-12-01: 1500 mg via INTRAVENOUS
  Filled 2017-12-01: qty 1500

## 2017-12-01 MED ORDER — ALBUTEROL SULFATE (2.5 MG/3ML) 0.083% IN NEBU: 2.5000 mg | INHALATION_SOLUTION | RESPIRATORY_TRACT | Status: DC | PRN

## 2017-12-01 MED ORDER — HYDROCODONE-ACETAMINOPHEN 5-325 MG PO TABS
1.0000 | ORAL_TABLET | Freq: Four times a day (QID) | ORAL | Status: DC | PRN
  Administered 2017-12-01: 1 via ORAL
  Filled 2017-12-01: qty 1

## 2017-12-01 MED ORDER — SODIUM CHLORIDE 0.9 % IV SOLN
2.0000 g | Freq: Once | INTRAVENOUS | Status: AC
  Administered 2017-12-01: 2 g via INTRAVENOUS
  Filled 2017-12-01: qty 2

## 2017-12-01 MED ORDER — HEPARIN SODIUM (PORCINE) 5000 UNIT/ML IJ SOLN
5000.0000 [IU] | Freq: Three times a day (TID) | INTRAMUSCULAR | Status: DC
  Administered 2017-12-01 – 2017-12-02 (×3): 5000 [IU] via SUBCUTANEOUS
  Filled 2017-12-01 (×3): qty 1

## 2017-12-01 MED ORDER — INFLUENZA VAC SPLIT QUAD 0.5 ML IM SUSY
0.5000 mL | PREFILLED_SYRINGE | INTRAMUSCULAR | Status: AC
  Administered 2017-12-02: 0.5 mL via INTRAMUSCULAR
  Filled 2017-12-01: qty 0.5

## 2017-12-01 MED ORDER — PANTOPRAZOLE SODIUM 40 MG PO TBEC
40.0000 mg | DELAYED_RELEASE_TABLET | Freq: Every day | ORAL | Status: DC
  Administered 2017-12-01 – 2017-12-02 (×2): 40 mg via ORAL
  Filled 2017-12-01 (×2): qty 1

## 2017-12-01 MED ORDER — PANCRELIPASE (LIP-PROT-AMYL) 12000-38000 UNITS PO CPEP
24000.0000 [IU] | ORAL_CAPSULE | Freq: Three times a day (TID) | ORAL | Status: DC
  Administered 2017-12-01 – 2017-12-02 (×4): 24000 [IU] via ORAL
  Filled 2017-12-01 (×6): qty 2

## 2017-12-01 MED ORDER — DORNASE ALFA 2.5 MG/2.5ML IN SOLN
2.5000 mg | Freq: Two times a day (BID) | RESPIRATORY_TRACT | Status: DC
  Administered 2017-12-01 – 2017-12-02 (×3): 2.5 mg via RESPIRATORY_TRACT
  Filled 2017-12-01 (×4): qty 2.5

## 2017-12-01 MED ORDER — LEVOFLOXACIN IN D5W 750 MG/150ML IV SOLN
750.0000 mg | INTRAVENOUS | Status: DC
  Administered 2017-12-02: 750 mg via INTRAVENOUS
  Filled 2017-12-01: qty 150

## 2017-12-01 MED ORDER — METHYLPREDNISOLONE SODIUM SUCC 125 MG IJ SOLR
60.0000 mg | Freq: Four times a day (QID) | INTRAMUSCULAR | Status: DC
  Administered 2017-12-01 – 2017-12-02 (×5): 60 mg via INTRAVENOUS
  Filled 2017-12-01 (×5): qty 2

## 2017-12-01 MED ORDER — TOPIRAMATE 25 MG PO TABS
25.0000 mg | ORAL_TABLET | Freq: Every day | ORAL | Status: DC
  Administered 2017-12-01: 25 mg via ORAL
  Filled 2017-12-01: qty 1

## 2017-12-01 MED ORDER — ALBUTEROL SULFATE (2.5 MG/3ML) 0.083% IN NEBU
2.5000 mg | INHALATION_SOLUTION | RESPIRATORY_TRACT | Status: DC
  Administered 2017-12-01 (×3): 2.5 mg via RESPIRATORY_TRACT
  Filled 2017-12-01 (×2): qty 3

## 2017-12-01 MED ORDER — LEVETIRACETAM 750 MG PO TABS
750.0000 mg | ORAL_TABLET | Freq: Two times a day (BID) | ORAL | Status: DC
  Administered 2017-12-01 – 2017-12-02 (×3): 750 mg via ORAL
  Filled 2017-12-01 (×3): qty 1

## 2017-12-01 MED ORDER — SODIUM CHLORIDE 0.9 % IV BOLUS
1000.0000 mL | Freq: Once | INTRAVENOUS | Status: AC
  Administered 2017-12-01: 1000 mL via INTRAVENOUS

## 2017-12-01 MED ORDER — MAGNESIUM SULFATE 2 GM/50ML IV SOLN
2.0000 g | Freq: Once | INTRAVENOUS | Status: AC
  Administered 2017-12-01: 2 g via INTRAVENOUS
  Filled 2017-12-01: qty 50

## 2017-12-01 MED ORDER — SODIUM CHLORIDE 0.9 % IV SOLN
2.0000 g | Freq: Three times a day (TID) | INTRAVENOUS | Status: DC
  Administered 2017-12-01: 2 g via INTRAVENOUS
  Filled 2017-12-01 (×2): qty 2

## 2017-12-01 MED ORDER — MONTELUKAST SODIUM 10 MG PO TABS
10.0000 mg | ORAL_TABLET | Freq: Every day | ORAL | Status: DC
  Administered 2017-12-01: 10 mg via ORAL
  Filled 2017-12-01: qty 1

## 2017-12-01 MED ORDER — ARFORMOTEROL TARTRATE 15 MCG/2ML IN NEBU
15.0000 ug | INHALATION_SOLUTION | Freq: Two times a day (BID) | RESPIRATORY_TRACT | Status: DC
  Filled 2017-12-01: qty 2

## 2017-12-01 MED ORDER — ALBUTEROL SULFATE (2.5 MG/3ML) 0.083% IN NEBU
2.5000 mg | INHALATION_SOLUTION | RESPIRATORY_TRACT | Status: DC | PRN
  Filled 2017-12-01: qty 3

## 2017-12-01 MED ORDER — METHYLPREDNISOLONE SODIUM SUCC 125 MG IJ SOLR
125.0000 mg | Freq: Once | INTRAMUSCULAR | Status: AC
  Administered 2017-12-01: 125 mg via INTRAVENOUS
  Filled 2017-12-01: qty 2

## 2017-12-01 MED ORDER — SUMATRIPTAN SUCCINATE 25 MG PO TABS
25.0000 mg | ORAL_TABLET | Freq: Every day | ORAL | Status: DC | PRN
  Administered 2017-12-01: 25 mg via ORAL
  Filled 2017-12-01 (×3): qty 1

## 2017-12-01 MED ORDER — ORAL CARE MOUTH RINSE
15.0000 mL | Freq: Two times a day (BID) | OROMUCOSAL | Status: DC
  Administered 2017-12-01 – 2017-12-02 (×2): 15 mL via OROMUCOSAL

## 2017-12-01 MED ORDER — SODIUM CHLORIDE 0.9 % IV SOLN
INTRAVENOUS | Status: DC
  Administered 2017-12-01 – 2017-12-02 (×2): via INTRAVENOUS

## 2017-12-01 MED ORDER — MOMETASONE FURO-FORMOTEROL FUM 100-5 MCG/ACT IN AERO
2.0000 | INHALATION_SPRAY | Freq: Two times a day (BID) | RESPIRATORY_TRACT | Status: DC
  Administered 2017-12-01 – 2017-12-02 (×3): 2 via RESPIRATORY_TRACT
  Filled 2017-12-01: qty 8.8

## 2017-12-01 MED ORDER — IPRATROPIUM-ALBUTEROL 0.5-2.5 (3) MG/3ML IN SOLN
3.0000 mL | Freq: Once | RESPIRATORY_TRACT | Status: AC
  Administered 2017-12-01: 3 mL via RESPIRATORY_TRACT
  Filled 2017-12-01: qty 3

## 2017-12-01 NOTE — ED Provider Notes (Signed)
[facility] EMERGENCY DEPARTMENT Provider Note   CSN: [id number] Arrival date & time: 12/01/17  0229     History   Chief Complaint Chief Complaint  Patient presents with  . Shortness of Breath    HPI Jasmine [name] is a 27 y.o. female.  Level 5 caveat for respiratory distress.  Patient from home.  She drove herself to the hospital.  She reports shortness of breath for the past 2 days, unable to speak more than a few whispered words.  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  She states she is had a cough of clear mucus for the past 2 days, fever to 100.4, left-sided chest pain with coughing and difficulty breathing over the past 2 days despite using her home medications.  The history is provided by the patient. The history is limited by the condition of the patient.  Shortness of Breath     Past Medical History:  Diagnosis Date  . Asthma   . Complication of anesthesia    "I wake up during anesthesia" (10/14/2015)  . 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  Marland Kitchen DVT (deep vein thrombosis) in pregnancy   . Epilepsy ([place])   . GERD (gastroesophageal reflux disease)   . Heart murmur    last work up age 73- no symtoms  . On home oxygen therapy    "2L when I sleep" (07/18/2017)  . Pancreatitis   . Sickle cell trait ([place])   . Sleep apnea    "used to wear CPAP; don't have one here in [place] since I moved in 2014" (10/14/2015)    Patient Active Problem List   Diagnosis Date Noted  . Facial contusion 07/17/2017  . Closed dislocation of right jaw 07/17/2017  . Nausea with hives 07/09/2017  . History of DVT (deep vein thrombosis) in pregnancy ([place]) 05/29/2017  . Sickle cell trait ([place]) 05/29/2017  . Acute asthma exacerbation 05/29/2017  . Chronic pancreatitis ([place]) 05/29/2017  . Seizures ([place]) 05/29/2017  . Tobacco abuse 05/29/2017  . Acute  on chronic respiratory failure with hypoxemia ([place]) 05/29/2017  . SOB (shortness of breath) 04/08/2017  . Cough 04/07/2017  . 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 Acute on chronic respiratory failure with hypoxia ([place]) 03/10/2017  . Colitis 11/12/2016  . Seizure ([place]) 11/12/2016  . Calculus of bile duct with acute cholecystitis with obstruction 10/14/2015  . Adjustment disorder with mixed anxiety and depressed mood 04/26/2015  . Low blood potassium 04/19/2015  . Asthma with acute exacerbation 04/19/2015  . Asthma 01/03/2015  . Asthma, chronic, unspecified asthma severity, with acute exacerbation 01/03/2015  . Influenza-like illness 01/03/2015  . Status post cesarean section 11/08/2014  . Previous cesarean section complicating pregnancy, antepartum condition or complication   . GERD (gastroesophageal reflux disease)   . Gastroesophageal reflux disease without esophagitis   . Abnormal biochemical finding on antenatal screening of mother   . Abnormal quad screen   . Echogenic focus of heart of fetus affecting antepartum care of mother   . Drug use affecting pregnancy, antepartum 05/19/2014  . Seizure disorder during pregnancy, antepartum ([place]) 05/13/2014  . Supervision of high risk pregnancy, antepartum 05/13/2014  . Asthma complicating pregnancy, antepartum 05/13/2014  . History of food anaphylaxis 05/13/2014  . Seizure disorder, sept 2015 last seizure 01/19/2013  . Anemia 01/19/2013    Past Surgical History:  Procedure Laterality  Date  . APPENDECTOMY    . CESAREAN SECTION  2013  . CESAREAN SECTION N/A 11/08/2014   Procedure: CESAREAN SECTION;  Surgeon: Truett Mainland, DO;  Location: [facility];  Service: Obstetrics;  Laterality: N/A;  . CHOLECYSTECTOMY N/A 10/16/2015   Procedure: LAPAROSCOPIC CHOLECYSTECTOMY WITH POSSIBLE INTRAOPERATIVE CHOLANGIOGRAM;  Surgeon: Donnie Mesa, MD;  Location: [facility];  Service: General;  Laterality: N/A;  . CLOSED REDUCTION MANDIBLE WITH MANDIBULOMA N/A  07/17/2017   Procedure: REDUCTION TMJ WITH INTRAMAXILLARY FIXATION;  Surgeon: Diona Browner, DDS;  Location: [facility];  Service: Oral Surgery;  Laterality: N/A;  . ESOPHAGOGASTRODUODENOSCOPY (EGD) WITH PROPOFOL N/A 11/15/2016   Procedure: ESOPHAGOGASTRODUODENOSCOPY (EGD) WITH PROPOFOL;  Surgeon: Clarene Essex, MD;  Location: [facility];  Service: Endoscopy;  Laterality: N/A;  . INGUINAL HERNIA REPAIR Bilateral ~ 1996  . NERVE, TENDON AND ARTERY REPAIR Right 09/23/2012   Procedure: I&D and Repair As Necessary/Right Hand and Palm;  Surgeon: Roseanne Kaufman, MD;  Location: [facility];  Service: Orthopedics;  Laterality: Right;  . TONSILLECTOMY       OB History    Gravida  3   Para  3   Term  3   Preterm      AB      Living  3     SAB      TAB      Ectopic      Multiple  0   Live Births  3            Home Medications    Prior to Admission medications   Medication Sig Start Date End Date Taking? Authorizing Provider  albuterol (PROVENTIL HFA;VENTOLIN HFA) 108 (90 Base) MCG/ACT inhaler Inhale 2 puffs into the lungs every 4 (four) hours as needed for wheezing or shortness of breath. 08/20/17   Clent Demark, PA-C  Alum & Mag Hydroxide-Simeth (GI COCKTAIL) SUSP suspension Take 30 mLs by mouth 2 (two) times daily as needed for indigestion. Shake well.    [provider]  Amylase-Lipase-Protease (CREON 20 PO) Take 2-7 capsules by mouth See admin instructions. Take 2-7 capsules by mouth with each meal and snack (dose based on amount of food intake)    [provider]  Ascorbic Acid (VITAMIN C) 100 MG CHEW Chew 1 tablet (100 mg total) by mouth daily. 08/20/17   Clent Demark, PA-C  docusate sodium (COLACE) 50 MG capsule Take 1 capsule (50 mg total) by mouth 2 (two) times daily as needed for mild constipation. 08/20/17   Clent Demark, PA-C  dornase alpha (PULMOZYME) 1 MG/ML nebulizer solution Take 2.5 mLs (2.5 mg total) by nebulization 2 (two) times daily. 08/20/17    Clent Demark, PA-C  feeding supplement, ENSURE ENLIVE, (ENSURE ENLIVE) LIQD Take 237 mLs by mouth 2 (two) times daily between meals. 474 mL 3 times a day, provide 3-week supply 08/20/17   Clent Demark, PA-C  Fluticasone-Salmeterol (ADVAIR) 500-50 MCG/DOSE AEPB Inhale 1 puff into the lungs 2 (two) times daily. 08/20/17   Clent Demark, PA-C  HYDROcodone-acetaminophen (NORCO/VICODIN) 5-325 MG tablet Take 1 tablet by mouth every 6 (six) hours as needed for moderate pain (pain). 07/23/17   Thurnell Lose, MD  ibuprofen (ADVIL,MOTRIN) 800 MG tablet Take 800 mg by mouth every 8 (eight) hours as needed.    [provider]  levETIRAcetam (KEPPRA) 750 MG tablet Take 1 tablet (750 mg total) by mouth 2 (two) times daily. 08/20/17   Clent Demark, PA-C  Lumacaftor-Ivacaftor 150-188 MG PACK Take 1 each by nebulization 2 (two) times daily. 08/20/17   Clent Demark, PA-C  montelukast (SINGULAIR) 10 MG tablet Take 10 mg by mouth at bedtime.    [provider]  Multiple Vitamin (MULTIVITAMIN WITH MINERALS) TABS tablet Take 2 tablets by mouth daily.    [provider]  SUMAtriptan (IMITREX) 25 MG tablet Take 1 tablet (25 mg total) by mouth daily. May take one more tablet two hours after the first. No more than two tablets per day. 08/20/17   Clent Demark, PA-C  tobramycin, PF, (TOBI) 300 MG/5ML nebulizer solution Take 5 mLs (300 mg total) by nebulization every 12 (twelve) hours. 08/20/17   Clent Demark, PA-C  topiramate (TOPAMAX) 25 MG tablet Take 1 tablet (25 mg total) by mouth at bedtime. 08/20/17   Clent Demark, PA-C    Family History Family History  Problem Relation Age of Onset  . Cancer Mother        cervical cancer  . Asthma Mother   . Hypertension Father   . Sickle cell anemia Father   . Asthma Sister   . Diabetes Maternal Aunt   . Cancer Maternal Grandmother     Social History Social History   Tobacco Use  . Smoking status: Former  Smoker    Packs/day: 0.25    Years: 4.00    Pack years: 1.00    Types: Cigarettes    Last attempt to quit: 10/02/2015    Years since quitting: 2.1  . Smokeless tobacco: Never Used  Substance Use Topics  . Alcohol use: No    Alcohol/week: 0.0 standard drinks    Frequency: Never  . Drug use: No     Allergies   Cheese; Chocolate; Ibuprofen; Ivp dye [iodinated diagnostic agents]; Latex; Orange juice [orange oil]; Other; Peach [prunus persica]; Peanuts [peanut oil]; Pear; Prednisone; Raspberry; Tylenol [acetaminophen]; Amoxicillin; Apricot flavor; Doxycycline; Erythromycin; Milk of magnesia [magnesium hydroxide]; and Penicillins   Review of Systems Review of Systems  Unable to perform ROS: Severe respiratory distress  Respiratory: Positive for shortness of breath.      Physical Exam Updated Vital Signs BP (!) 139/103 (BP Location: Right Arm)   Pulse (!) 131   Resp 18   SpO2 100%   Physical Exam  Constitutional: She is oriented to person, place, and time. She appears well-developed and well-nourished. She appears distressed.  Anxious, moderate to severe respiratory distress with tachypnea  HENT:  Head: Normocephalic and atraumatic.  Mouth/Throat: Oropharynx is clear and moist. No oropharyngeal exudate.  No swelling of her oropharynx, tongue or lips.  Eyes: Pupils are equal, round, and reactive to light. Conjunctivae and EOM are normal.  Neck: Normal range of motion. Neck supple.  No meningismus.  Cardiovascular: Normal rate, regular rhythm, normal heart sounds and intact distal pulses.  No murmur heard. Pulmonary/Chest: Breath sounds normal. She is in respiratory distress.  Very diminished breath sounds throughout with scattered expiratory wheezing  Abdominal: Soft. There is no tenderness. There is no rebound and no guarding.  Musculoskeletal: Normal range of motion. She exhibits no edema or tenderness.  Neurological: She is alert and oriented to person, place, and time. No  cranial nerve deficit. She exhibits normal muscle tone. Coordination normal.   5/5 strength throughout. CN 2-12 intact.Equal grip strength.   Skin: Skin is warm. Capillary refill takes less than 2 seconds. No rash noted.  Psychiatric: She has a normal mood and affect. Her behavior is normal.  Nursing note and vitals reviewed.    ED Treatments / Results  Labs (all labs ordered are listed, but only abnormal results are displayed) Labs Reviewed  CBC WITH DIFFERENTIAL/PLATELET - Abnormal; Notable for the following components:      Result Value   WBC 16.0 (*)    Neutro Abs 9.1 (*)    Lymphs Abs 5.5 (*)    All other components within normal limits  COMPREHENSIVE METABOLIC PANEL - Abnormal; Notable for the following components:   CO2 21 (*)    All other components within normal limits  GLUCOSE, CAPILLARY - Abnormal; Notable for the following components:   Glucose-Capillary 103 (*)    All other components within normal limits  I-STAT ARTERIAL BLOOD GAS, ED - Abnormal; Notable for the following components:   pH, Arterial 7.295 (*)    pCO2 arterial 55.8 (*)    pO2, Arterial 263.0 (*)    All other components within normal limits  I-STAT CG4 LACTIC ACID, ED - Abnormal; Notable for the following components:   Lactic Acid, Venous 2.05 (*)    All other components within normal limits  MRSA PCR SCREENING  CULTURE, BLOOD (ROUTINE X 2)  CULTURE, BLOOD (ROUTINE X 2)  RESPIRATORY PANEL BY PCR  GRAM STAIN  CULTURE, RESPIRATORY  BRAIN NATRIURETIC PEPTIDE  TROPONIN I  D-DIMER, QUANTITATIVE (NOT AT [facility])  LACTIC ACID, PLASMA  LACTIC ACID, PLASMA  RAPID URINE DRUG SCREEN, HOSP PERFORMED  IGE  I-STAT BETA HCG BLOOD, ED ([facility], [facility], [facility] ONLY)    EKG EKG Interpretation  Date/Time:  Sunday December 01 2017 02:39:57 EST Ventricular Rate:  117 PR Interval:    QRS Duration: 69 QT Interval:  313 QTC Calculation: 437 R Axis:   68 Text Interpretation:  Sinus tachycardia Nonspecific T abnormalities,  anterior leads Baseline wander in lead(s) II III aVF V3 Rate faster Nonspecific ST abnormality Confirmed by Ezequiel Essex [phone]) on 12/01/2017 2:51:00 AM   Radiology Dg Chest Portable 1 View  Result Date: 12/01/2017 CLINICAL DATA:  Acute onset of shortness of breath. EXAM: PORTABLE CHEST 1 VIEW COMPARISON:  Chest radiograph performed 07/17/2017 FINDINGS: The lungs are well-aerated and clear. There is no evidence of focal opacification, pleural effusion or pneumothorax. The cardiomediastinal silhouette is within normal limits. No acute osseous abnormalities are seen. IMPRESSION: No acute cardiopulmonary process seen. Electronically Signed   By: Garald Balding M.D.   On: 12/01/2017 03:12    Procedures Procedures (including critical care time)  Medications Ordered in ED Medications  ipratropium-albuterol (DUONEB) 0.5-2.5 (3) MG/3ML nebulizer solution 3 mL (has no administration in time range)  sodium chloride 0.9 % bolus 1,000 mL (has no administration in time range)  magnesium sulfate IVPB 2 g 50 mL (has no administration in time range)  methylPREDNISolone sodium succinate (SOLU-MEDROL) 125 mg/2 mL injection 125 mg (125 mg Intravenous Given 12/01/17 0250)     Initial Impression / Assessment and Plan / ED Course  I have reviewed the triage vital signs and the nursing notes.  Pertinent labs & imaging results that were available during my care of the patient were reviewed by me and considered in my medical decision making (see chart for details).    Patient with history of cystic fibrosis which is questionable by records presenting with respiratory distress.  Unable to speak.  Placed on BiPAP on arrival for work of breathing.  She is given nebulizers and steroids as well as IV magnesium.  Placed on Bipap.  ABG with respiratory acidosis and  hypercapnia.   CXR negative. D-dimer negative. Low suspicion for ACS or PE.  Broad spectrum antibiotics started after cultures obtained.  Work  of breathing improved on BiPAP.  We will treat for both cystic fibrosis exacerbation as well as asthma exacerbation.  Unclear source of her respiratory acidosis with hypercapnia.  She was responding well to BiPAP and maintaining her mental status.  ICU admission discussed with Dr. Carson Myrtle.   CRITICAL CARE Performed by: Ezequiel Essex Total critical care time: 45 minutes Critical care time was exclusive of separately billable procedures and treating other patients. Critical care was necessary to treat or prevent imminent or life-threatening deterioration. Critical care was time spent personally by me on the following activities: development of treatment plan with patient and/or surrogate as well as nursing, discussions with consultants, evaluation of patient's response to treatment, examination of patient, obtaining history from patient or surrogate, ordering and performing treatments and interventions, ordering and review of laboratory studies, ordering and review of radiographic studies, pulse oximetry and re-evaluation of patient's condition.   Final Clinical Impressions(s) / ED Diagnoses   Final diagnoses:  Severe persistent asthma with exacerbation  Acute respiratory failure with hypoxia and hypercapnia [facility])    ED Discharge Orders    None       Ezequiel Essex, MD 12/01/17 0800

## 2017-12-01 NOTE — Progress Notes (Signed)
Two RNs present in the room with patient after complaining of a weird taste in her mouth, like metal. Patient then observed to have a seizure episode lasting approximately one minute. Heart rate increased into the 130s. She was immediately put onto her side and she came to within a few minutes. Will continue to monitor. Vital signs remain stable for now.

## 2017-12-01 NOTE — Progress Notes (Addendum)
Patient taken off of bipap and placed on 3L nasal cannula.  Currently tolerating well.  Will continue to monitor. 

## 2017-12-01 NOTE — Progress Notes (Signed)
NAME:  Jasmine [name], MRN:  [id number], DOB:  [birth year], LOS: 0 ADMISSION DATE:  12/01/2017, CONSULTATION DATE:  11/17 REFERRING MD:  Dr. Wyvonnia Dusky, CHIEF COMPLAINT:  Respiratory Distress    History of present illness   27 year old female presents to ED with reported 2 days of shortness of breath with productive cough and fever from asthma exacerbation with history of CF (reports being treated in [place] prior to moving to [place]).  She reports her daughter had a cold prior to pt getting ill.  UDS positive for THC.  Past Medical History  Cystic fibrosis, asthma, seizures, GERD, DVT, OSA, Sickle cell trait, GERD, DVT during pregnancy  Significant Hospital Events   11/17 > Presents to ED   Consults:    Procedures:  N/A   Significant Diagnostic Tests:    Micro Data:  RVP 11/17 >> Blood 11/17 >>  Antimicrobials:  Levaquin 11/17 >> Maxipime 11/17 >> 11/17  Objective   Blood pressure 120/63, pulse 78, temperature 98.2 F (36.8 C), temperature source Axillary, resp. rate 20, weight 82.6 kg, SpO2 100 %.    FiO2 (%):  [30 %-50 %] 30 %   Intake/Output Summary (Last 24 hours) at 12/01/2017 1136 Last data filed at 12/01/2017 0900 Gross per 24 hour  Intake 2845.75 ml  Output -  Net 2845.75 ml   Filed Weights   12/01/17 0601  Weight: 82.6 kg    Examination:  General - alert Eyes - pupils reactive ENT - no sinus tenderness, no stridor, raspy voice Cardiac - regular rate/rhythm, no murmur Chest - equal breath sounds b/l, no wheezing or rales Abdomen - soft, non tender, + bowel sounds Extremities - no cyanosis, clubbing, or edema Skin - no rashes Lymphatics - no lymphadenopathy Neuro - normal strength, moves extremities, follows commands Psych - normal mood and behavior   Resolved Hospital Problem list     Assessment & Plan:   Acute asthma exacerbation. Reported hx of Cystic Fibrosis previously treated in [place]. Plan - oxygen to keep SpO2 > 92% - Bipap prn - continue  solumedrol - dulera, singulair - prn albuterol - she will need outpt pulmonary f/u in [place] - continue levaquin  Hx of cystic fibrosis. Plan - pulmozyme - listed as being on lumacaftor-ivacaftor as outpt - continue creon - need to get records from providers in [place]  Hx of seizures, atypical migraines. Plan - continue keppra, topamax - prn norco, imitrex  Lack of financial resources. Plan - will consult social worker to assist with medical access   Best practice:  Diet: regular diet DVT prophylaxis: SQ heparin GI prophylaxis: protonix Mobility: oob Code Status: full code Family Communication: no family at bedside  Labs    CMP Latest Ref Rng & Units 12/01/2017 08/20/2017 07/23/2017  Glucose 70 - 99 mg/dL 95 94 99  BUN 6 - 20 mg/dL 8 10 <5(L)  Creatinine 0.44 - 1.00 mg/dL 0.76 0.45(L) 0.53  Sodium 135 - 145 mmol/L 136 141 141  Potassium 3.5 - 5.1 mmol/L 3.5 4.2 3.0(L)  Chloride 98 - 111 mmol/L 101 104 101  CO2 22 - 32 mmol/L 21(L) 21 28  Calcium 8.9 - 10.3 mg/dL 9.6 9.4 9.0  Total Protein 6.5 - 8.1 g/dL 7.8 7.1 -  Total Bilirubin 0.3 - 1.2 mg/dL 0.4 <0.2 -  Alkaline Phos 38 - 126 U/L 64 73 -  AST 15 - 41 U/L 25 23 -  ALT 0 - 44 U/L 19 18 -   CBC Latest Ref  Rng & Units 12/01/2017 08/20/2017 07/21/2017  WBC 4.0 - 10.5 K/uL 16.0(H) 8.4 8.6  Hemoglobin 12.0 - 15.0 g/dL 12.6 11.9 10.2(L)  Hematocrit 36.0 - 46.0 % 40.4 38.0 31.7(L)  Platelets 150 - 400 K/uL 285 Bryant, MD [place] 12/01/2017, 12:12 PM

## 2017-12-01 NOTE — Progress Notes (Signed)
Pharmacy Antibiotic Note  Jasmine [name] is a 27 y.o. female admitted on 12/01/2017 with h/o cystic fibrosis with acute illness.  Pharmacy has been consulted for cefepime and levaquin dosing.She has allergy to Penicillin but has tolerated cephalosporins in the past. Plan: Cefepime 2gm IV q8 hours Levaquin 750 mg IV q24 hours F/u cultures and clinical course  Weight: 182 lb 1.6 oz (82.6 kg)  Temp (24hrs), Avg:97.6 F (36.4 C), Min:97.6 F (36.4 C), Max:97.6 F (36.4 C)  Recent Labs  Lab 12/01/17 0256 12/01/17 0328  WBC 16.0*  --   CREATININE 0.76  --   LATICACIDVEN  --  2.05*    Estimated Creatinine Clearance: 115.4 mL/min (by C-G formula based on SCr of 0.76 mg/dL).    Allergies  Allergen Reactions  . Cheese Shortness Of Breath  . Chocolate Shortness Of Breath  . Ibuprofen Hives and Shortness Of Breath    Children's ibuprofen  . Ivp Dye [Iodinated Diagnostic Agents] Shortness Of Breath  . Latex Anaphylaxis, Swelling and Other (See Comments)    Reaction:  Localized swelling   . Orange Juice [Orange Oil] Shortness Of Breath  . Other Hives and Other (See Comments)    Pt states that she is allergic to all steroids except IV solu-medrol.    Marland Kitchen Peach [Prunus Persica] Anaphylaxis  . Peanuts [Peanut Oil] Anaphylaxis  . Pear Anaphylaxis  . Prednisone Hives  . Raspberry Anaphylaxis  . Tylenol [Acetaminophen] Hives, Shortness Of Breath and Other (See Comments)    Pt states that this is only with the liquid form  . Amoxicillin Hives and Other (See Comments)    Has patient had a PCN reaction causing immediate rash, facial/tongue/throat swelling, SOB or lightheadedness with hypotension: No Has patient had a PCN reaction causing severe rash involving mucus membranes or skin necrosis: No Has patient had a PCN reaction that required hospitalization: No Has patient had a PCN reaction occurring within the last 10 years: No If all of the above answers are "NO", then may proceed with  Cephalosporin use.  Marland Kitchen Apricot Flavor Hives  . Doxycycline Hives  . Erythromycin Hives  . Milk Of Magnesia [Magnesium Hydroxide] Hives and Itching  . Penicillins Hives and Other (See Comments)    Has patient had a PCN reaction causing immediate rash, facial/tongue/throat swelling, SOB or lightheadedness with hypotension: No Has patient had a PCN reaction causing severe rash involving mucus membranes or skin necrosis: No Has patient had a PCN reaction that required hospitalization: No Has patient had a PCN reaction occurring within the last 10 years: No If all of the above answers are "NO", then may proceed with Cephalosporin use.     Thank you for allowing pharmacy to be a part of this patient's care.  Excell Seltzer [place]et 12/01/2017 6:41 AM

## 2017-12-01 NOTE — ED Triage Notes (Signed)
Pt from home, SOB for 2 days.  Pt unable to speak in complete sentences.  Very diminished breath sounds.  Provider bedside

## 2017-12-01 NOTE — H&P (Signed)
NAME:  Jasmine [name], MRN:  [id number], DOB:  1990-08-13, LOS: 0 ADMISSION DATE:  12/01/2017, CONSULTATION DATE:  11/17 REFERRING MD:  Dr. Wyvonnia Dusky, CHIEF COMPLAINT:  Respiratory Distress    History of present illness   27 year old female presents to ED with reported 2 days of shortness of breath with productive cough and fever. On arrival to ED patient appeared to be in severe distress with diminished breath sounds and expiratory wheezing. Given Magnesium, Steroids, Albuterol and placed on BiPAP. PCCM asked to admit.   Past Medical History  Cystic fibrosis, asthma, seizures, GERD, DVT  Significant Hospital Events   11/17 > Presents to ED   Consults:  PCCM  Procedures:  N/A   Significant Diagnostic Tests:  CXR 11/17 >   Micro Data:  RVP 11/17 >> Blood 11/17 >> MRSA PCR 11/17 >>  Antimicrobials:  Levaquin 11/17 >> Maxipime 11/17 >>   Objective   Blood pressure 123/76, pulse 78, temperature 97.6 F (36.4 C), temperature source Axillary, resp. rate (!) 41, weight 82.6 kg, SpO2 100 %.    FiO2 (%):  [30 %-50 %] 30 %   Intake/Output Summary (Last 24 hours) at 12/01/2017 3762 Last data filed at 12/01/2017 0600 Gross per 24 hour  Intake 2095.75 ml  Output -  Net 2095.75 ml   Filed Weights   12/01/17 0601  Weight: 82.6 kg    Examination: General: Adult female, moderate respiratory distress  HENT: BiPAP in place  Lungs: Exp Wheeze, Coarse breath sounds  Cardiovascular: RRR, no MRG  Abdomen: Soft, non-distended  Extremities: -edema  Neuro: alert, oriented, follows commands  GU: foley   Resolved Hospital Problem list     Assessment & Plan:   Acute on Chronic Hypoxic/Hypercarbic Respiratory Failure with presumed acute exacerbation of asthma vs CF H/O Cystic Fibrosis on Home Oxygen 2L, Asthma, Tobacco Abuse   Plan  -Continue BiPAP as needed  -Wean Oxygen to Maintain Saturation >92 -Scheduled Albuterol, Pulmonzyme, Dulera, Singular  -RVP/Sputum  Pending -Solu-Medrol 60q6 -Levaquin/Cefepime  -Pulmonary Hygiene   GERD  Plan -PPI  H/O Pancreatitis Plan  -Continue Creon   H/O Seizures Plan  -Continue Keppra   Best practice:  Diet: NPO DVT prophylaxis: Heparin SQ, SCD GI prophylaxis: PPI Glucose control Mobility: Bedrest  Code Status: FC Family Communication: No family at bedside  Disposition:   Labs   CBC: Recent Labs  Lab 12/01/17 0256  WBC 16.0*  NEUTROABS 9.1*  HGB 12.6  HCT 40.4  MCV 86.0  PLT 831    Basic Metabolic Panel: Recent Labs  Lab 12/01/17 0256  NA 136  K 3.5  CL 101  CO2 21*  GLUCOSE 95  BUN 8  CREATININE 0.76  CALCIUM 9.6   GFR: Estimated Creatinine Clearance: 115.4 mL/min (by C-G formula based on SCr of 0.76 mg/dL). Recent Labs  Lab 12/01/17 0256 12/01/17 0328  WBC 16.0*  --   LATICACIDVEN  --  2.05*    Liver Function Tests: Recent Labs  Lab 12/01/17 0256  AST 25  ALT 19  ALKPHOS 64  BILITOT 0.4  PROT 7.8  ALBUMIN 4.6   No results for input(s): LIPASE, AMYLASE in the last 168 hours. No results for input(s): AMMONIA in the last 168 hours.  ABG    Component Value Date/Time   PHART 7.295 (L) 12/01/2017 0316   PCO2ART 55.8 (H) 12/01/2017 0316   PO2ART 263.0 (H) 12/01/2017 0316   HCO3 27.1 12/01/2017 0316   TCO2 29 12/01/2017 0316  ACIDBASEDEF 4.0 (H) 05/29/2017 0528   O2SAT 100.0 12/01/2017 0316     Coagulation Profile: No results for input(s): INR, PROTIME in the last 168 hours.  Cardiac Enzymes: Recent Labs  Lab 12/01/17 0256  TROPONINI <0.03    HbA1C: No results found for: HGBA1C  CBG: Recent Labs  Lab 12/01/17 0601  GLUCAP 103*    Review of Systems:   All negative; except for those that are bolded, which indicate positives.  Constitutional: weight loss, weight gain, night sweats, fevers, chills, fatigue, weakness.  HEENT: headaches, sore throat, sneezing, nasal congestion, post nasal drip, difficulty swallowing, tooth/dental  problems, visual complaints, visual changes, ear aches. Neuro: difficulty with speech, weakness, numbness, ataxia. CV:  chest pain, orthopnea, PND, swelling in lower extremities, dizziness, palpitations, syncope.  Resp: cough, hemoptysis, dyspnea, wheezing. GI: heartburn, indigestion, abdominal pain, nausea, vomiting, diarrhea, constipation, change in bowel habits, loss of appetite, hematemesis, melena, hematochezia.  GU: dysuria, change in color of urine, urgency or frequency, flank pain, hematuria. MSK: joint pain or swelling, decreased range of motion. Psych: change in mood or affect, depression, anxiety, suicidal ideations, homicidal ideations. Skin: rash, itching, bruising.   Past Medical History  She,  has a past medical history of Asthma, Complication of anesthesia, Cystic fibrosis ([place]), DVT (deep vein thrombosis) in pregnancy, Epilepsy ([place]), GERD (gastroesophageal reflux disease), Heart murmur, On home oxygen therapy, Pancreatitis, Sickle cell trait ([place]), and Sleep apnea.   Surgical History    Past Surgical History:  Procedure Laterality Date  . APPENDECTOMY    . CESAREAN SECTION  2013  . CESAREAN SECTION N/A 11/08/2014   Procedure: CESAREAN SECTION;  Surgeon: Truett Mainland, DO;  Location: [facility];  Service: Obstetrics;  Laterality: N/A;  . CHOLECYSTECTOMY N/A 10/16/2015   Procedure: LAPAROSCOPIC CHOLECYSTECTOMY WITH POSSIBLE INTRAOPERATIVE CHOLANGIOGRAM;  Surgeon: Donnie Mesa, MD;  Location: [facility];  Service: General;  Laterality: N/A;  . CLOSED REDUCTION MANDIBLE WITH MANDIBULOMA N/A 07/17/2017   Procedure: REDUCTION TMJ WITH INTRAMAXILLARY FIXATION;  Surgeon: Diona Browner, DDS;  Location: [facility];  Service: Oral Surgery;  Laterality: N/A;  . ESOPHAGOGASTRODUODENOSCOPY (EGD) WITH PROPOFOL N/A 11/15/2016   Procedure: ESOPHAGOGASTRODUODENOSCOPY (EGD) WITH PROPOFOL;  Surgeon: Clarene Essex, MD;  Location: [facility];  Service: Endoscopy;  Laterality: N/A;  . INGUINAL HERNIA REPAIR  Bilateral ~ 1996  . NERVE, TENDON AND ARTERY REPAIR Right 09/23/2012   Procedure: I&D and Repair As Necessary/Right Hand and Palm;  Surgeon: Roseanne Kaufman, MD;  Location: [facility];  Service: Orthopedics;  Laterality: Right;  . TONSILLECTOMY       Social History   reports that she quit smoking about 2 years ago. Her smoking use included cigarettes. She has a 1.00 pack-year smoking history. She has never used smokeless tobacco. She reports that she does not drink alcohol or use drugs.   Family History   Her family history includes Asthma in her mother and sister; Cancer in her maternal grandmother and mother; Diabetes in her maternal aunt; Hypertension in her father; Sickle cell anemia in her father.   Allergies Allergies  Allergen Reactions  . Cheese Shortness Of Breath  . Chocolate Shortness Of Breath  . Ibuprofen Hives and Shortness Of Breath    Children's ibuprofen  . Ivp Dye [Iodinated Diagnostic Agents] Shortness Of Breath  . Latex Anaphylaxis, Swelling and Other (See Comments)    Reaction:  Localized swelling   . Orange Juice [Orange Oil] Shortness Of Breath  . Other Hives and Other (See Comments)  Pt states that she is allergic to all steroids except IV solu-medrol.    Marland Kitchen Peach [Prunus Persica] Anaphylaxis  . Peanuts [Peanut Oil] Anaphylaxis  . Pear Anaphylaxis  . Prednisone Hives  . Raspberry Anaphylaxis  . Tylenol [Acetaminophen] Hives, Shortness Of Breath and Other (See Comments)    Pt states that this is only with the liquid form  . Amoxicillin Hives and Other (See Comments)    Has patient had a PCN reaction causing immediate rash, facial/tongue/throat swelling, SOB or lightheadedness with hypotension: No Has patient had a PCN reaction causing severe rash involving mucus membranes or skin necrosis: No Has patient had a PCN reaction that required hospitalization: No Has patient had a PCN reaction occurring within the last 10 years: No If all of the above answers are "NO",  then may proceed with Cephalosporin use.  Marland Kitchen Apricot Flavor Hives  . Doxycycline Hives  . Erythromycin Hives  . Milk Of Magnesia [Magnesium Hydroxide] Hives and Itching  . Penicillins Hives and Other (See Comments)    Has patient had a PCN reaction causing immediate rash, facial/tongue/throat swelling, SOB or lightheadedness with hypotension: No Has patient had a PCN reaction causing severe rash involving mucus membranes or skin necrosis: No Has patient had a PCN reaction that required hospitalization: No Has patient had a PCN reaction occurring within the last 10 years: No If all of the above answers are "NO", then may proceed with Cephalosporin use.     Home Medications  Prior to Admission medications   Medication Sig Start Date End Date Taking? Authorizing Provider  albuterol (PROVENTIL HFA;VENTOLIN HFA) 108 (90 Base) MCG/ACT inhaler Inhale 2 puffs into the lungs every 4 (four) hours as needed for wheezing or shortness of breath. 08/20/17  Yes Clent Demark, PA-C  Amylase-Lipase-Protease (CREON 20 PO) Take 2-7 capsules by mouth See admin instructions. Take 2-7 capsules by mouth with each meal and snack (dose based on amount of food intake)   Yes [provider]  Ascorbic Acid (VITAMIN C) 100 MG CHEW Chew 1 tablet (100 mg total) by mouth daily. 08/20/17  Yes Clent Demark, PA-C  dornase alpha (PULMOZYME) 1 MG/ML nebulizer solution Take 2.5 mLs (2.5 mg total) by nebulization 2 (two) times daily. 08/20/17  Yes Clent Demark, PA-C  feeding supplement, ENSURE ENLIVE, (ENSURE ENLIVE) LIQD Take 237 mLs by mouth 2 (two) times daily between meals. 474 mL 3 times a day, provide 3-week supply 08/20/17  Yes Clent Demark, PA-C  Fluticasone-Salmeterol (ADVAIR) 500-50 MCG/DOSE AEPB Inhale 1 puff into the lungs 2 (two) times daily. 08/20/17  Yes Clent Demark, PA-C  levETIRAcetam (KEPPRA) 750 MG tablet Take 1 tablet (750 mg total) by mouth 2 (two) times daily. 08/20/17  Yes Clent Demark, PA-C  Lumacaftor-Ivacaftor 150-188 MG PACK Take 1 each by nebulization 2 (two) times daily. 08/20/17  Yes Clent Demark, PA-C  montelukast (SINGULAIR) 10 MG tablet Take 10 mg by mouth at bedtime.   Yes [provider]  Multiple Vitamin (MULTIVITAMIN WITH MINERALS) TABS tablet Take 2 tablets by mouth daily.   Yes [provider]  SUMAtriptan (IMITREX) 25 MG tablet Take 1 tablet (25 mg total) by mouth daily. May take one more tablet two hours after the first. No more than two tablets per day. 08/20/17  Yes Clent Demark, PA-C  tobramycin, PF, (TOBI) 300 MG/5ML nebulizer solution Take 5 mLs (300 mg total) by nebulization every 12 (twelve) hours. 08/20/17  Yes Altamease Oiler,  Lesli Albee, PA-C  topiramate (TOPAMAX) 25 MG tablet Take 1 tablet (25 mg total) by mouth at bedtime. 08/20/17  Yes Clent Demark, PA-C  docusate sodium (COLACE) 50 MG capsule Take 1 capsule (50 mg total) by mouth 2 (two) times daily as needed for mild constipation. Patient not taking: Reported on 12/01/2017 08/20/17   Clent Demark, PA-C  HYDROcodone-acetaminophen (NORCO/VICODIN) 5-325 MG tablet Take 1 tablet by mouth every 6 (six) hours as needed for moderate pain (pain). Patient not taking: Reported on 12/01/2017 07/23/17   Thurnell Lose, MD     Critical care time: 18 minutes      Hayden Pedro, AGACNP-BC [facility]  PCCM Pgr: [phone]

## 2017-12-02 ENCOUNTER — Encounter (INDEPENDENT_AMBULATORY_CARE_PROVIDER_SITE_OTHER): Payer: Self-pay | Admitting: Physician Assistant

## 2017-12-02 ENCOUNTER — Telehealth: Payer: Self-pay

## 2017-12-02 DIAGNOSIS — J4551 Severe persistent asthma with (acute) exacerbation: Principal | ICD-10-CM

## 2017-12-02 LAB — CBC
HEMATOCRIT: 37 % (ref 36.0–46.0)
HEMOGLOBIN: 11.4 g/dL — AB (ref 12.0–15.0)
MCH: 26.5 pg (ref 26.0–34.0)
MCHC: 30.8 g/dL (ref 30.0–36.0)
MCV: 85.8 fL (ref 80.0–100.0)
NRBC: 0 % (ref 0.0–0.2)
Platelets: 258 10*3/uL (ref 150–400)
RBC: 4.31 MIL/uL (ref 3.87–5.11)
RDW: 15.5 % (ref 11.5–15.5)
WBC: 29.8 10*3/uL — ABNORMAL HIGH (ref 4.0–10.5)

## 2017-12-02 LAB — BASIC METABOLIC PANEL
ANION GAP: 9 (ref 5–15)
CHLORIDE: 110 mmol/L (ref 98–111)
CO2: 17 mmol/L — ABNORMAL LOW (ref 22–32)
Calcium: 9 mg/dL (ref 8.9–10.3)
Creatinine, Ser: 0.65 mg/dL (ref 0.44–1.00)
GFR calc Af Amer: >60 mL/min (ref >60)
GFR calc non Af Amer: >60 mL/min (ref >60)
Glucose, Bld: 126 mg/dL — ABNORMAL HIGH (ref 70–99)
POTASSIUM: 4 mmol/L (ref 3.5–5.1)
SODIUM: 136 mmol/L (ref 135–145)

## 2017-12-02 LAB — PHOSPHORUS: PHOSPHORUS: 2.5 mg/dL (ref 2.5–4.6)

## 2017-12-02 LAB — MAGNESIUM: MAGNESIUM: 2.3 mg/dL (ref 1.7–2.4)

## 2017-12-02 MED ORDER — LEVOFLOXACIN 500 MG PO TABS: 500.0000 mg | ORAL_TABLET | Freq: Every day | ORAL | 0 refills | Status: AC

## 2017-12-02 MED ORDER — METHYLPREDNISOLONE 4 MG PO TBPK: 8.0000 mg | ORAL_TABLET | Freq: Every evening | ORAL | Status: DC

## 2017-12-02 MED ORDER — METHYLPREDNISOLONE 4 MG PO TBPK: 4.0000 mg | ORAL_TABLET | ORAL | Status: DC

## 2017-12-02 MED ORDER — METHYLPREDNISOLONE 4 MG PO TBPK: 8.0000 mg | ORAL_TABLET | Freq: Every morning | ORAL | Status: DC

## 2017-12-02 MED ORDER — METHYLPREDNISOLONE SODIUM SUCC 125 MG IJ SOLR: 60.0000 mg | Freq: Four times a day (QID) | INTRAMUSCULAR | Status: DC

## 2017-12-02 MED ORDER — METHYLPREDNISOLONE 4 MG PO TBPK: 4.0000 mg | ORAL_TABLET | Freq: Four times a day (QID) | ORAL | Status: DC

## 2017-12-02 MED ORDER — METHYLPREDNISOLONE 4 MG PO TBPK: 4.0000 mg | ORAL_TABLET | Freq: Three times a day (TID) | ORAL | Status: DC

## 2017-12-02 MED ORDER — METHYLPREDNISOLONE 4 MG PO TBPK: ORAL_TABLET | Freq: Every day | ORAL | 0 refills | Status: AC

## 2017-12-02 NOTE — Telephone Encounter (Signed)
-----   Message from Erick Colace, NP sent at 12/02/2017 12:56 PM EST ----- Regarding: follow up  Needs a follow up with an NP next week either Monday OR Tuesday  Will need to call her with time of appointment   Thanks!  Jasmine [name]

## 2017-12-02 NOTE — Care Management (Signed)
15:00 Pt discharged home prior to CM assessment.  CM was able to reach pt via phone.  Pt denied barriers with paying for medications - pt did however communicate barriers with transportation (CSW provided SCAT information prior to discharge).  Pt also informed CM that she was on generic Keppra prior to admit and it is covered under her medicaid.  Pt confirmed she has an active relationship with her PCP.  NO CM Needs determined

## 2017-12-02 NOTE — Progress Notes (Signed)
   NAME:  Jasmine [name], MRN:  [id number], DOB:  10-14-1990, LOS: 1 ADMISSION DATE:  12/01/2017, CONSULTATION DATE:  11/17 REFERRING MD:  Dr. Wyvonnia Dusky, CHIEF COMPLAINT:  Respiratory Distress    History of present illness   27 year old female presents to ED with reported 2 days of shortness of breath with productive cough and fever from asthma exacerbation with history of CF (reports being treated in [place] prior to moving to [place]).  She reports her daughter had a cold prior to pt getting ill.  UDS positive for THC.  Past Medical History  Cystic fibrosis, asthma, seizures, GERD, DVT, OSA, Sickle cell trait, GERD, DVT during pregnancy  Significant Hospital Events   11/17 > Presents to ED.  Witnessed seizure, lasting approximately 1 minute. 11/18: Feels back to baseline.  Wants to go home.  Eating and drinking without difficulty.  Denies shortness of breath.  We discussed her seizures, she has at least 10 seizures a month, she still actively undergoing titration of her anticonvulsants, has a safety aide at home, also has pending referral to [facility] neurology Consults:    Procedures:  N/A   Significant Diagnostic Tests:    Micro Data:  RVP 11/17 >>neg Blood 11/17 >>  Antimicrobials:  Levaquin 11/17 >> Maxipime 11/17 >> 11/17  Objective   Blood pressure 106/65, pulse 81, temperature 98.7 F (37.1 C), temperature source Oral, resp. rate 10, weight 82 kg, SpO2 100 %.    FiO2 (%):  [30 %] 30 %   Intake/Output Summary (Last 24 hours) at 12/02/2017 0955 Last data filed at 12/02/2017 0900 Gross per 24 hour  Intake 2120 ml  Output 1725 ml  Net 395 ml   Filed Weights   12/01/17 0601 12/02/17 0414  Weight: 82.6 kg 82 kg    Examination:  General: This is a pleasant 27 year old female resting in bed she is in no acute distress HEENT: Normocephalic atraumatic no jugular venous distention Pulmonary: Scattered rhonchi, no accessory use. Cardiac: Regular rate and rhythm without murmur rub  or gallop Abdomen: Soft nontender no organomegaly Extremities: Brisk cap refill no edema Neuro: Awake oriented no focal deficits does have some mild chronic left extremity weakness and left leg GU: Clear yellow  Resolved Hospital Problem list     Assessment & Plan:   Acute asthma exacerbation. Reported hx of Cystic Fibrosis previously treated in [place]. Plan Change solumedrol to medrol taper  Cont dulera and singulair  PRN albuterol Needs pulm f/u (will set this up) Day 2/5   Hx of cystic fibrosis. Plan Cont pulmozyme Lumacaftor-ivacaftor as outpt Will need records from [place]   Hx of seizures, atypical migraines. Had witnessed seizure on 11/17.  Plan Cont Keppra and topamax No change in home meds  Lack of financial resources. Plan Social work consulted   Best practice:  Diet: regular diet DVT prophylaxis: SQ heparin GI prophylaxis: protonix Mobility: oob Code Status: full code Family Communication: no family at bedside Disposition: She can go home, will set her up with follow-up in our clinic   Erick Colace ACNP-BC [place] Pager # [phone] OR # [phone] if no answer

## 2017-12-02 NOTE — Clinical Social Work Note (Signed)
Clinical Social Work Assessment  Patient Details  Name: Jasmine [name] MRN: [id number] Date of Birth: [birth year]  Date of referral:  12/02/17               Reason for consult:  Care Management Concerns, Transportation                Permission sought to share information with:  Other Permission granted to share information::  No  Name::     none given   Agency::  none given   Relationship::  none given   Contact Information:  none given   Housing/Transportation Living arrangements for the past 2 months:  Single Family Home(alone. 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 ) Lives with:  Self Do you feel safe going back to the place where you live?  Yes Need for family participation in patient care:  Yes (Comment)  Care giving concerns:  CSW consulted for transportation issues and medication concerns.    Social Worker assessment / plan:  CSW spoke with pt at bedside. CSW was informed by pt that is from home alone. Pt expressed that pt has help at home with [place]. CSW spoke with pt concerning other needs and pt expressed that pt doesn't drive due to seizure disorder which in turn makes it very difficult for pt to get around and to appointments. CSW offered pt SCAT Application and pt expressed that pt would look it over and make a choice on whether to use this service.   During this assessment pt laid in bed and make eye contact with CSW while speaking. Pt appeared to be ready to go home as pt asked CSW "can I leave now". CSW responded by informing pt  That she would need to speak with MD.   Employment status:  Unemployed Insurance information:  Medicaid In [place] PT Recommendations:  Not assessed at this time Information / Referral to community resources:      Patient/Family's Response to care:  Pt appeared to be understanding and agreeable to plan of care at this time.   Patient/Family's Understanding of and Emotional Response to Diagnosis, Current Treatment, and Prognosis:  At this time pt has expressed no further questions or concerns to CSW.   Emotional Assessment Appearance:  Appears stated age Attitude/Demeanor/Rapport:  Engaged Affect (typically observed):  Accepting, Adaptable, Pleasant Orientation:  Oriented to Place, Oriented to Situation, Oriented to Self, Oriented to  Time Alcohol / Substance use:  Not Applicable Psych involvement (Current and /or in the community):  No (Comment)  Discharge Needs  Concerns to be addressed:    Readmission within the last 30 days:    Current discharge risk:  Lack of support system, Dependent with Mobility, Lives alone Barriers to Discharge:  Continued Medical Work up   [employer], [place] 12/02/2017, 8:40 AM

## 2017-12-02 NOTE — Telephone Encounter (Signed)
Attempted to call, patient asked that we call her back later.

## 2017-12-02 NOTE — Discharge Summary (Signed)
Physician Discharge Summary         Patient ID: Jasmine [name] MRN: [id number] DOB/AGE: 05-26-1990 27 y.o.  Admit date: 12/01/2017 Discharge date: 12/02/2017  Discharge Diagnoses:   Acute asthma exacerbation history of cystic fibrosis History of seizures Atypical migraines chronic respiratory failure on 3 L nasal cannula   Discharge summary    Is a 27 year old female who presented to the emergency room on 11/17 with a 2-day history of shortness of breath, productive cough, fever and worsening distress.  Apparently her daughter had had a cold prior to this.  Urine drug screen positive for THC.  She has a history of asthma as well as cystic fibrosis.  Required brief Noninvasive positive pressure ventilation, and was admitted to the intensive care with concern for clinical decline.  She was treated with IV antibiotics, IV systemic steroids, scheduled bronchodilators and supportive care.  She felt better after approximately 48 hours was close to baseline.  Of note she did have one witnessed seizure during her hospitalization this lasted for about 1 minute, this is been a chronic issue for her for which she is being titrated through the outpatient setting.  She averages about 10 seizures a month.  She currently has home health at home for seizure monitoring and assistance.  As of 11/18 she is improved with systemic steroids and empiric antibiotics feeling back to baseline.  We ambulated her in the [place] without desaturation she was deemed ready for discharge as of 11/18   Discharge Plan by Active Problems   Acute asthmatic exacerbation Plan Home on Medrol taper 3 more days of Levaquin As needed albuterol Resume Advair follow-up 7 days pulmonary clinic  History of cystic fibrosis Plan Cont pulmozyme  Cont Lumacaftor-ivacaftor  Awaiting records from [place]  History of seizures Plan Continue home anticonvulsants Has follow-up with [facility] neurology  Significant Hospital tests/  studies   Procedures    Culture data/antimicrobials   Respiratory viral panel was negative   Consults  None    Discharge Exam: Blood Pressure 106/65   Pulse 81   Temperature 99 F (37.2 C) (Oral)   Respiration 10   Weight 82 kg   Oxygen Saturation 100%   Body Mass Index 29.18 kg/m  General: Pleasant 27 year old female resting in bed no acute distress HEENT normocephalic atraumatic no jugular venous distention Pulmonary: Scattered rhonchi and wheeze, no accessory use Cardiac: Regular rate and rhythm Abdomen: Soft nontender Neuro: At baseline Extremities: Brisk cap refill  Labs at discharge   Lab Results  Component Value Date   CREATININE 0.65 12/02/2017   BUN <5 (L) 12/02/2017   NA 136 12/02/2017   K 4.0 12/02/2017   CL 110 12/02/2017   CO2 17 (L) 12/02/2017   Lab Results  Component Value Date   WBC 29.8 (H) 12/02/2017   HGB 11.4 (L) 12/02/2017   HCT 37.0 12/02/2017   MCV 85.8 12/02/2017   PLT 258 12/02/2017   Lab Results  Component Value Date   ALT 19 12/01/2017   AST 25 12/01/2017   ALKPHOS 64 12/01/2017   BILITOT 0.4 12/01/2017   Lab Results  Component Value Date   INR 1.15 04/19/2015   INR 1.03 07/04/2014   INR 1.08 04/08/2014    Current radiological studies    Dg Chest Portable 1 View  Result Date: 12/01/2017 CLINICAL DATA:  Acute onset of shortness of breath. EXAM: PORTABLE CHEST 1 VIEW COMPARISON:  Chest radiograph performed 07/17/2017 FINDINGS: The lungs are well-aerated and clear. There is  no evidence of focal opacification, pleural effusion or pneumothorax. The cardiomediastinal silhouette is within normal limits. No acute osseous abnormalities are seen. IMPRESSION: No acute cardiopulmonary process seen. Electronically Signed   By: Garald Balding M.D.   On: 12/01/2017 03:12    Disposition:    Discharge disposition: 01-Home or Self Care       Discharge Instructions    Diet - low sodium heart healthy   Complete by:  As directed      Increase activity slowly   Complete by:  As directed       Allergies as of 12/02/2017    Allergen Reactions Comment   Cheese Shortness Of Breath    Chocolate Shortness Of Breath    Ibuprofen Hives, Shortness Of Breath Children's ibuprofen   Ivp Dye [iodinated Diagnostic Agents] Shortness Of Breath    Latex Anaphylaxis, Swelling, Other (See Comments) Reaction:  Localized swelling    Orange Juice [orange Oil] Shortness Of Breath    Other Hives, Other (See Comments) Pt states that she is allergic to all steroids except IV solu-medrol.     Peach [prunus Persica] Anaphylaxis    Peanuts [peanut Oil] Anaphylaxis    Pear Anaphylaxis    Prednisone Hives    Raspberry Anaphylaxis    Tylenol [acetaminophen] Hives, Shortness Of Breath, Other (See Comments) Pt states that this is only with the liquid form   Amoxicillin Hives, Other (See Comments) Has patient had a PCN reaction causing immediate rash, facial/tongue/throat swelling, SOB or lightheadedness with hypotension: No Has patient had a PCN reaction causing severe rash involving mucus membranes or skin necrosis: No Has patient had a PCN reaction that required hospitalization: No Has patient had a PCN reaction occurring within the last 10 years: No If all of the above answers are "NO", then may proceed with Cephalosporin use.   Apricot Flavor Hives    Doxycycline Hives    Erythromycin Hives    Milk Of Magnesia [magnesium Hydroxide] Hives, Itching    Penicillins Hives, Other (See Comments) Has patient had a PCN reaction causing immediate rash, facial/tongue/throat swelling, SOB or lightheadedness with hypotension: No Has patient had a PCN reaction causing severe rash involving mucus membranes or skin necrosis: No Has patient had a PCN reaction that required hospitalization: No Has patient had a PCN reaction occurring within the last 10 years: No If all of the above answers are "NO", then may proceed with Cephalosporin use.      Medication List     Take these medications   albuterol 108 (90 Base) MCG/ACT inhaler Commonly known as:  PROVENTIL HFA;VENTOLIN HFA Inhale 2 puffs into the lungs every 4 (four) hours as needed for wheezing or shortness of breath.   CREON 20 PO Take 2-7 capsules by mouth See admin instructions. Take 2-7 capsules by mouth with each meal and snack (dose based on amount of food intake)   docusate sodium 50 MG capsule Commonly known as:  COLACE Take 1 capsule (50 mg total) by mouth 2 (two) times daily as needed for mild constipation.   dornase alpha 1 MG/ML nebulizer solution Commonly known as:  PULMOZYME Take 2.5 mLs (2.5 mg total) by nebulization 2 (two) times daily.   feeding supplement (ENSURE ENLIVE) Liqd Take 237 mLs by mouth 2 (two) times daily between meals. 474 mL 3 times a day, provide 3-week supply   Fluticasone-Salmeterol 500-50 MCG/DOSE Aepb Commonly known as:  ADVAIR Inhale 1 puff into the lungs 2 (two) times daily.   HYDROcodone-acetaminophen  5-325 MG tablet Commonly known as:  NORCO/VICODIN Take 1 tablet by mouth every 6 (six) hours as needed for moderate pain (pain).   levETIRAcetam 750 MG tablet Commonly known as:  KEPPRA Take 1 tablet (750 mg total) by mouth 2 (two) times daily.   levofloxacin 500 MG tablet Commonly known as:  LEVAQUIN Take 1 tablet (500 mg total) by mouth daily for 3 days.   Lumacaftor-Ivacaftor 150-188 MG Pack Take 1 each by nebulization 2 (two) times daily.   methylPREDNISolone 4 MG Tbpk tablet Commonly known as:  MEDROL DOSEPAK Take by mouth daily for 6 days. 24mg  to 4mg  ; (decrease by 4mg /d)   montelukast 10 MG tablet Commonly known as:  SINGULAIR Take 10 mg by mouth at bedtime.   multivitamin with minerals Tabs tablet Take 2 tablets by mouth daily.   SUMAtriptan 25 MG tablet Commonly known as:  IMITREX Take 1 tablet (25 mg total) by mouth daily. May take one more tablet two hours after the first. No more than two tablets per day.   tobramycin  (PF) 300 MG/5ML nebulizer solution Commonly known as:  TOBI Take 5 mLs (300 mg total) by nebulization every 12 (twelve) hours.   topiramate 25 MG tablet Commonly known as:  TOPAMAX Take 1 tablet (25 mg total) by mouth at bedtime.   Vitamin C 100 MG Chew Chew 1 tablet (100 mg total) by mouth daily.        Follow-up appointment   Our office  Discharge Condition:    good  Physician Statement:   The Patient was personally examined, the discharge assessment and plan has been personally reviewed and I agree with ACNP Sallye Lunz's assessment and plan. 34  minutes of time have been dedicated to discharge assessment, planning and discharge instructions.   Signed: Clementeen Graham 12/02/2017, 12:55 PM

## 2017-12-03 ENCOUNTER — Encounter (INDEPENDENT_AMBULATORY_CARE_PROVIDER_SITE_OTHER): Payer: Self-pay | Admitting: Physician Assistant

## 2017-12-04 LAB — IGE: IgE (Immunoglobulin E), Serum: 75 IU/mL (ref 6–495)

## 2017-12-06 LAB — CULTURE, BLOOD (ROUTINE X 2)
CULTURE: NO GROWTH
Culture: NO GROWTH
SPECIAL REQUESTS: ADEQUATE
SPECIAL REQUESTS: ADEQUATE

## 2017-12-11 ENCOUNTER — Encounter (HOSPITAL_COMMUNITY): Payer: Self-pay | Admitting: Emergency Medicine

## 2017-12-11 ENCOUNTER — Ambulatory Visit (HOSPITAL_COMMUNITY)
Admission: EM | Admit: 2017-12-11 | Discharge: 2017-12-11 | Disposition: A | Discharge status: Home / Self Care | Payer: Medicaid Other | Attending: Family Medicine | Admitting: Family Medicine

## 2017-12-11 DIAGNOSIS — S7011XA Contusion of right thigh, initial encounter: Secondary | ICD-10-CM

## 2017-12-11 MED ORDER — CETIRIZINE HCL 10 MG PO TABS: 10.0000 mg | ORAL_TABLET | Freq: Every day | ORAL | 0 refills | Status: DC

## 2017-12-11 NOTE — ED Triage Notes (Signed)
Pt here for bruise to right upper leg; pt sts hx of DVT and not currently on blood thinners

## 2017-12-11 NOTE — ED Provider Notes (Signed)
[facility]    CSN: [id number] Arrival date & time: 12/11/17  0626     History   Chief Complaint Chief Complaint  Patient presents with  . Bleeding/Bruising    HPI Jasmine [name] is a 27 y.o. female.   27 year old female comes in for right thigh bruising that has been there for a few weeks.  She is unsure how she sustained it, but has not resolved, and is getting slightly painful.  She was admitted to the hospital 12/01/2017 for respiratory distress and failure, was put on BiPAP and was in critical care for 2 days.  At that time, she was on heparin drip for DVT prevention, and was discontinued on both the nurse after discharge.  She has not been able to follow-up with her PCP for follow-up appointment.  She denies any additional injury or trauma to the area.  Denies swelling, erythema, warmth.  States legs are itching with dry skin, and has been scratching area as well.  Has not tried anything for the symptoms.     Past Medical History:  Diagnosis Date  . Asthma   . Complication of anesthesia    "I wake up during anesthesia" (10/14/2015)  . Cystic fibrosis ([place])    O2 dependent-  very uncertain diagnosis!  Marland Kitchen DVT (deep vein thrombosis) in pregnancy   . Epilepsy ([place])   . GERD (gastroesophageal reflux disease)   . Heart murmur    last work up age 23- no symtoms  . On home oxygen therapy    "2L when I sleep" (07/18/2017)  . Pancreatitis   . Sickle cell trait ([place])   . Sleep apnea    "used to wear CPAP; don't have one here in [place] since I moved in 2014" (10/14/2015)    Patient Active Problem List   Diagnosis Date Noted  . Asthma exacerbation 12/01/2017  . Acute hypercapnic respiratory failure ([place]) 12/01/2017  . Exocrine pancreatic insufficiency 12/01/2017  . Facial contusion 07/17/2017  . Closed dislocation of right jaw 07/17/2017  . Nausea with hives 07/09/2017  . History of DVT (deep vein thrombosis) in pregnancy ([place]) 05/29/2017  . Sickle cell trait ([place])  05/29/2017  . Acute asthma exacerbation 05/29/2017  . Chronic pancreatitis ([place]) 05/29/2017  . Seizures ([place]) 05/29/2017  . Tobacco abuse 05/29/2017  . Acute on chronic respiratory failure with hypoxemia ([place]) 05/29/2017  . SOB (shortness of breath) 04/08/2017  . Cough 04/07/2017  . Cystic fibrosis ([place]) 03/10/2017  . Acute on chronic respiratory failure with hypoxia ([place]) 03/10/2017  . Colitis 11/12/2016  . Seizure ([place]) 11/12/2016  . Calculus of bile duct with acute cholecystitis with obstruction 10/14/2015  . Adjustment disorder with mixed anxiety and depressed mood 04/26/2015  . Low blood potassium 04/19/2015  . Asthma with acute exacerbation 04/19/2015  . Asthma 01/03/2015  . Asthma, chronic, unspecified asthma severity, with acute exacerbation 01/03/2015  . Influenza-like illness 01/03/2015  . Status post cesarean section 11/08/2014  . Previous cesarean section complicating pregnancy, antepartum condition or complication   . GERD (gastroesophageal reflux disease)   . Gastroesophageal reflux disease without esophagitis   . Abnormal biochemical finding on antenatal screening of mother   . Abnormal quad screen   . Echogenic focus of heart of fetus affecting antepartum care of mother   . Drug use affecting pregnancy, antepartum 05/19/2014  . Seizure disorder during pregnancy, antepartum ([place]) 05/13/2014  . Supervision of high risk pregnancy, antepartum 05/13/2014  . Asthma complicating pregnancy, antepartum 05/13/2014  . History  of food anaphylaxis 05/13/2014  . Seizure disorder, sept 2015 last seizure 01/19/2013  . Anemia 01/19/2013    Past Surgical History:  Procedure Laterality Date  . APPENDECTOMY    . CESAREAN SECTION  2013  . CESAREAN SECTION N/A 11/08/2014   Procedure: CESAREAN SECTION;  Surgeon: Truett Mainland, DO;  Location: [facility];  Service: Obstetrics;  Laterality: N/A;  . CHOLECYSTECTOMY N/A 10/16/2015   Procedure: LAPAROSCOPIC CHOLECYSTECTOMY WITH POSSIBLE  INTRAOPERATIVE CHOLANGIOGRAM;  Surgeon: Donnie Mesa, MD;  Location: [facility];  Service: General;  Laterality: N/A;  . CLOSED REDUCTION MANDIBLE WITH MANDIBULOMA N/A 07/17/2017   Procedure: REDUCTION TMJ WITH INTRAMAXILLARY FIXATION;  Surgeon: Diona Browner, DDS;  Location: [facility];  Service: Oral Surgery;  Laterality: N/A;  . ESOPHAGOGASTRODUODENOSCOPY (EGD) WITH PROPOFOL N/A 11/15/2016   Procedure: ESOPHAGOGASTRODUODENOSCOPY (EGD) WITH PROPOFOL;  Surgeon: Clarene Essex, MD;  Location: [facility];  Service: Endoscopy;  Laterality: N/A;  . INGUINAL HERNIA REPAIR Bilateral ~ 1996  . NERVE, TENDON AND ARTERY REPAIR Right 09/23/2012   Procedure: I&D and Repair As Necessary/Right Hand and Palm;  Surgeon: Roseanne Kaufman, MD;  Location: [facility];  Service: Orthopedics;  Laterality: Right;  . TONSILLECTOMY      OB History    Gravida  3   Para  3   Term  3   Preterm      AB      Living  3     SAB      TAB      Ectopic      Multiple  0   Live Births  3            Home Medications    Prior to Admission medications   Medication Sig Start Date End Date Taking? Authorizing Provider  albuterol (PROVENTIL HFA;VENTOLIN HFA) 108 (90 Base) MCG/ACT inhaler Inhale 2 puffs into the lungs every 4 (four) hours as needed for wheezing or shortness of breath. 08/20/17   Clent Demark, PA-C  Amylase-Lipase-Protease (CREON 20 PO) Take 2-7 capsules by mouth See admin instructions. Take 2-7 capsules by mouth with each meal and snack (dose based on amount of food intake)    [provider]  Ascorbic Acid (VITAMIN C) 100 MG CHEW Chew 1 tablet (100 mg total) by mouth daily. 08/20/17   Clent Demark, PA-C  cetirizine (ZYRTEC) 10 MG tablet Take 1 tablet (10 mg total) by mouth daily. 12/11/17   Tasia Catchings, Evey Mcmahan V, PA-C  docusate sodium (COLACE) 50 MG capsule Take 1 capsule (50 mg total) by mouth 2 (two) times daily as needed for mild constipation. Patient not taking: Reported on 12/01/2017 08/20/17   Clent Demark, PA-C  dornase alpha (PULMOZYME) 1 MG/ML nebulizer solution Take 2.5 mLs (2.5 mg total) by nebulization 2 (two) times daily. 08/20/17   Clent Demark, PA-C  feeding supplement, ENSURE ENLIVE, (ENSURE ENLIVE) LIQD Take 237 mLs by mouth 2 (two) times daily between meals. 474 mL 3 times a day, provide 3-week supply 08/20/17   Clent Demark, PA-C  Fluticasone-Salmeterol (ADVAIR) 500-50 MCG/DOSE AEPB Inhale 1 puff into the lungs 2 (two) times daily. 08/20/17   Clent Demark, PA-C  HYDROcodone-acetaminophen (NORCO/VICODIN) 5-325 MG tablet Take 1 tablet by mouth every 6 (six) hours as needed for moderate pain (pain). Patient not taking: Reported on 12/01/2017 07/23/17   Thurnell Lose, MD  levETIRAcetam (KEPPRA) 750 MG tablet Take 1 tablet (750 mg total) by mouth 2 (two) times daily. 08/20/17  Clent Demark, PA-C  Lumacaftor-Ivacaftor 150-188 MG PACK Take 1 each by nebulization 2 (two) times daily. 08/20/17   Clent Demark, PA-C  montelukast (SINGULAIR) 10 MG tablet Take 10 mg by mouth at bedtime.    [provider]  Multiple Vitamin (MULTIVITAMIN WITH MINERALS) TABS tablet Take 2 tablets by mouth daily.    [provider]  SUMAtriptan (IMITREX) 25 MG tablet Take 1 tablet (25 mg total) by mouth daily. May take one more tablet two hours after the first. No more than two tablets per day. 08/20/17   Clent Demark, PA-C  tobramycin, PF, (TOBI) 300 MG/5ML nebulizer solution Take 5 mLs (300 mg total) by nebulization every 12 (twelve) hours. 08/20/17   Clent Demark, PA-C  topiramate (TOPAMAX) 25 MG tablet Take 1 tablet (25 mg total) by mouth at bedtime. 08/20/17   Clent Demark, PA-C    Family History Family History  Problem Relation Age of Onset  . Cancer Mother        cervical cancer  . Asthma Mother   . Hypertension Father   . Sickle cell anemia Father   . Asthma Sister   . Diabetes Maternal Aunt   . Cancer Maternal Grandmother     Social  History Social History   Tobacco Use  . Smoking status: Former Smoker    Packs/day: 0.25    Years: 4.00    Pack years: 1.00    Types: Cigarettes    Last attempt to quit: 10/02/2015    Years since quitting: 2.1  . Smokeless tobacco: Never Used  Substance Use Topics  . Alcohol use: No    Alcohol/week: 0.0 standard drinks    Frequency: Never  . Drug use: No     Allergies   Cheese; Chocolate; Ibuprofen; Ivp dye [iodinated diagnostic agents]; Latex; Orange juice [orange oil]; Other; Peach [prunus persica]; Peanuts [peanut oil]; Pear; Prednisone; Raspberry; Tylenol [acetaminophen]; Amoxicillin; Apricot flavor; Doxycycline; Erythromycin; Milk of magnesia [magnesium hydroxide]; and Penicillins   Review of Systems Review of Systems  Reason unable to perform ROS: See HPI as above.     Physical Exam Triage Vital Signs ED Triage Vitals [12/11/17 1037]  Enc Vitals Group     BP (!) 144/84     Pulse Rate (!) 102     Resp 18     Temp 97.7 F (36.5 C)     Temp Source Oral     SpO2 100 %     Weight      Height      Head Circumference      Peak Flow      Pain Score 3     Pain Loc      Pain Edu?      Excl. in [place]?    No data found.  Updated Vital Signs BP (!) 144/84 (BP Location: Right Arm)   Pulse (!) 102   Temp 97.7 F (36.5 C) (Oral)   Resp 18   SpO2 100%   Physical Exam  Constitutional: She is oriented to person, place, and time. She appears well-developed and well-nourished. No distress.  HENT:  Head: Normocephalic and atraumatic.  Eyes: Pupils are equal, round, and reactive to light. Conjunctivae are normal.  Neurological: She is alert and oriented to person, place, and time.  Skin: She is not diaphoretic.  2 cm x 3 cm contusion/hematoma to the right lateral thigh.  No warmth, erythema.  Mild tenderness to palpation without fluctuance.  No cords  felt.    UC Treatments / Results  Labs (all labs ordered are listed, but only abnormal results are displayed) Labs  Reviewed - No data to display  EKG None  Radiology No results found.  Procedures Procedures (including critical care time)  Medications Ordered in UC Medications - No data to display  Initial Impression / Assessment and Plan / UC Course  I have reviewed the triage vital signs and the nursing notes.  Pertinent labs & imaging results that were available during my care of the patient were reviewed by me and considered in my medical decision making (see chart for details).    Review of notes showed patient was admitted to ICU 12/01/2017 to 12/02/2017.  Had a 1 minute seizure that was witnessed.  She was on heparin drip during her visit.  On exam with 2 cm x 3 cm hematoma without warmth or erythema.  Location is low suspicion for DVT.  Will have patient do warm compresses, and avoid scratching area.  Will provide Zyrtec for itching.  Patient to follow-up with PCP as soon as possible for reevaluation of recent hospital visit.  Return precautions given.  Patient expresses understanding and agrees to plan.  Final Clinical Impressions(s) / UC Diagnoses   Final diagnoses:  Thigh hematoma, right, initial encounter    ED Prescriptions    Medication Sig Dispense Auth. Provider   cetirizine (ZYRTEC) 10 MG tablet Take 1 tablet (10 mg total) by mouth daily. 15 tablet Tobin Chad, [place] 12/11/17 1132

## 2017-12-11 NOTE — Discharge Instructions (Addendum)
No alarming sign on exam right now. Try to avoid itching. Warm compress to the location can help clear up the bruising. You can take zyrtec to help with itching. Please make appointment with your PCP and follow up in the next week for reevaluation of recent hospital visit.

## 2017-12-15 DIAGNOSIS — R0602 Shortness of breath: Secondary | ICD-10-CM | POA: Diagnosis not present

## 2017-12-15 DIAGNOSIS — R2689 Other abnormalities of gait and mobility: Secondary | ICD-10-CM | POA: Diagnosis not present

## 2017-12-15 DIAGNOSIS — R569 Unspecified convulsions: Secondary | ICD-10-CM | POA: Diagnosis not present

## 2017-12-15 DIAGNOSIS — S0083XA Contusion of other part of head, initial encounter: Secondary | ICD-10-CM | POA: Diagnosis not present

## 2017-12-15 DIAGNOSIS — J45901 Unspecified asthma with (acute) exacerbation: Secondary | ICD-10-CM | POA: Diagnosis not present

## 2017-12-15 DIAGNOSIS — G4739 Other sleep apnea: Secondary | ICD-10-CM | POA: Diagnosis not present

## 2017-12-15 DIAGNOSIS — J45909 Unspecified asthma, uncomplicated: Secondary | ICD-10-CM | POA: Diagnosis not present

## 2017-12-16 NOTE — Telephone Encounter (Signed)
Appointment made for 12.4.19 at 10:30am for Hospital F/U. Nothing further is needed at this time.

## 2017-12-18 ENCOUNTER — Inpatient Hospital Stay: Payer: Self-pay | Admitting: Adult Health

## 2018-01-06 ENCOUNTER — Other Ambulatory Visit: Payer: Self-pay

## 2018-01-06 ENCOUNTER — Encounter (INDEPENDENT_AMBULATORY_CARE_PROVIDER_SITE_OTHER): Payer: Self-pay | Admitting: Nurse Practitioner

## 2018-01-06 ENCOUNTER — Ambulatory Visit (INDEPENDENT_AMBULATORY_CARE_PROVIDER_SITE_OTHER): Payer: Medicaid Other | Admitting: Nurse Practitioner

## 2018-01-06 VITALS — BP 148/86 | HR 103 | Temp 98.2°F | Ht 66.0 in | Wt 179.4 lb

## 2018-01-06 DIAGNOSIS — R51 Headache: Secondary | ICD-10-CM | POA: Diagnosis not present

## 2018-01-06 DIAGNOSIS — S7011XD Contusion of right thigh, subsequent encounter: Secondary | ICD-10-CM

## 2018-01-06 DIAGNOSIS — L299 Pruritus, unspecified: Secondary | ICD-10-CM

## 2018-01-06 DIAGNOSIS — R519 Headache, unspecified: Secondary | ICD-10-CM

## 2018-01-06 MED ORDER — SUMATRIPTAN SUCCINATE 50 MG PO TABS: 50.0000 mg | ORAL_TABLET | Freq: Every day | ORAL | 1 refills | Status: DC

## 2018-01-06 MED ORDER — HYDROXYZINE HCL 50 MG PO TABS: 50.0000 mg | ORAL_TABLET | Freq: Every day | ORAL | 1 refills | Status: AC

## 2018-01-06 MED ORDER — TOPIRAMATE 50 MG PO TABS: 50.0000 mg | ORAL_TABLET | Freq: Every day | ORAL | 3 refills | Status: DC

## 2018-01-06 NOTE — Progress Notes (Signed)
Assessment & Plan:  Jasmine [name] was seen today for hospitalization follow-up.  Diagnoses and all orders for this visit:  Nonintractable headache, unspecified chronicity pattern, unspecified headache type -     topiramate (TOPAMAX) 50 MG tablet; Take 1 tablet (50 mg total) by mouth at bedtime. -     SUMAtriptan (IMITREX) 50 MG tablet; Take 1 tablet (50 mg total) by mouth daily. May take one more tablet two hours after the first. No more than two tablets per day.  Itching -     hydrOXYzine (ATARAX/VISTARIL) 50 MG tablet; Take 1 tablet (50 mg total) by mouth at bedtime.  Hematoma of right thigh, subsequent encounter Resolved  Patient has been counseled on age-appropriate routine health concerns for screening and prevention. These are reviewed and up-to-date. Referrals have been placed accordingly. Immunizations are up-to-date or declined.    Subjective:   Chief Complaint  Patient presents with  . Hospitalization Follow-up    thigh hematoma    HPI Jasmine [name] 27 y.o. female presents to office today for f/u to right thigh hematoma which has completely resolved. She was evaluated in the ED on 12-11-2017 with right thigh hematoma.She denied any injury or trauma. Work up was negative for DVT.  She was discharged with recommendations to apply warm compresses to the area and to try zyrtec for the BLE itching which did not help to relieve the itching.    Pruritis She endorses increased intense BLE itching. Mostly occurring at night. She uses a moisturizer for her legs after she bathes or showers which does not relieve the itching.    Migraines She was prescribed low dose topamax 25mg  at bedtime and low dose sumatriptan with no relief of her headache pain. Headaches are right sided with photophobia and visual disturbances. She states her migraines have triggered her seizure in hte past. She saw a neurologist many years ago when she lived in [place] but has not followed up since moving here.  Her PCP referred her to Neurology however they made multiple attempts to contact her to schedule.    Review of Systems  Constitutional: Negative for fever, malaise/fatigue and weight loss.  HENT: Negative.  Negative for nosebleeds.   Eyes: Negative.  Negative for blurred vision, double vision and photophobia.  Respiratory: Negative.  Negative for cough and shortness of breath.   Cardiovascular: Negative.  Negative for chest pain, palpitations and leg swelling.  Gastrointestinal: Positive for heartburn. Negative for abdominal pain, blood in stool, constipation, diarrhea, melena, nausea and vomiting.  Musculoskeletal: Negative.  Negative for myalgias.  Skin: Positive for itching. Negative for rash.  Neurological: Positive for seizures and headaches. Negative for dizziness and focal weakness.  Psychiatric/Behavioral: Negative.  Negative for suicidal ideas.    Past Medical History:  Diagnosis Date  . Asthma   . Complication of anesthesia    "I wake up during anesthesia" (10/14/2015)  . 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  Marland Kitchen DVT (deep vein thrombosis) in pregnancy   . Epilepsy ([place])   . GERD (gastroesophageal reflux disease)   . Heart murmur    last work up age 15- no symtoms  . On home oxygen therapy    "2L when I sleep" (07/18/2017)  . Pancreatitis   . Sickle cell trait ([place])   . Sleep apnea    "used to wear CPAP; don't have one here in [place] since I moved in 2014" (10/14/2015)    Past Surgical History:  Procedure Laterality  Date  . APPENDECTOMY    . CESAREAN SECTION  2013  . CESAREAN SECTION N/A 11/08/2014   Procedure: CESAREAN SECTION;  Surgeon: Truett Mainland, DO;  Location: [facility];  Service: Obstetrics;  Laterality: N/A;  . CHOLECYSTECTOMY N/A 10/16/2015   Procedure: LAPAROSCOPIC CHOLECYSTECTOMY WITH POSSIBLE INTRAOPERATIVE CHOLANGIOGRAM;  Surgeon: Donnie Mesa, MD;  Location: [facility];  Service: General;  Laterality: N/A;  . CLOSED REDUCTION MANDIBLE  WITH MANDIBULOMA N/A 07/17/2017   Procedure: REDUCTION TMJ WITH INTRAMAXILLARY FIXATION;  Surgeon: Diona Browner, DDS;  Location: [facility];  Service: Oral Surgery;  Laterality: N/A;  . ESOPHAGOGASTRODUODENOSCOPY (EGD) WITH PROPOFOL N/A 11/15/2016   Procedure: ESOPHAGOGASTRODUODENOSCOPY (EGD) WITH PROPOFOL;  Surgeon: Clarene Essex, MD;  Location: [facility];  Service: Endoscopy;  Laterality: N/A;  . INGUINAL HERNIA REPAIR Bilateral ~ 1996  . NERVE, TENDON AND ARTERY REPAIR Right 09/23/2012   Procedure: I&D and Repair As Necessary/Right Hand and Palm;  Surgeon: Roseanne Kaufman, MD;  Location: [facility];  Service: Orthopedics;  Laterality: Right;  . TONSILLECTOMY      Family History  Problem Relation Age of Onset  . Cancer Mother        cervical cancer  . Asthma Mother   . Hypertension Father   . Sickle cell anemia Father   . Asthma Sister   . Diabetes Maternal Aunt   . Cancer Maternal Grandmother     Social History Reviewed with no changes to be made today.   Outpatient Medications Prior to Visit  Medication Sig Dispense Refill  . albuterol (PROVENTIL HFA;VENTOLIN HFA) 108 (90 Base) MCG/ACT inhaler Inhale 2 puffs into the lungs every 4 (four) hours as needed for wheezing or shortness of breath. 1 Inhaler 3  . Amylase-Lipase-Protease (CREON 20 PO) Take 2-7 capsules by mouth See admin instructions. Take 2-7 capsules by mouth with each meal and snack (dose based on amount of food intake)    . Ascorbic Acid (VITAMIN C) 100 MG CHEW Chew 1 tablet (100 mg total) by mouth daily. 90 each 2  . cetirizine (ZYRTEC) 10 MG tablet Take 1 tablet (10 mg total) by mouth daily. 15 tablet 0  . docusate sodium (COLACE) 50 MG capsule Take 1 capsule (50 mg total) by mouth 2 (two) times daily as needed for mild constipation. 60 capsule 2  . dornase alpha (PULMOZYME) 1 MG/ML nebulizer solution Take 2.5 mLs (2.5 mg total) by nebulization 2 (two) times daily. 75 mL 3  . feeding supplement, ENSURE ENLIVE, (ENSURE ENLIVE) LIQD  Take 237 mLs by mouth 2 (two) times daily between meals. 474 mL 3 times a day, provide 3-week supply 60 Bottle 3  . Fluticasone-Salmeterol (ADVAIR) 500-50 MCG/DOSE AEPB Inhale 1 puff into the lungs 2 (two) times daily. 1 each 5  . HYDROcodone-acetaminophen (NORCO/VICODIN) 5-325 MG tablet Take 1 tablet by mouth every 6 (six) hours as needed for moderate pain (pain). 20 tablet 0  . levETIRAcetam (KEPPRA) 750 MG tablet Take 1 tablet (750 mg total) by mouth 2 (two) times daily. 60 tablet 2  . Lumacaftor-Ivacaftor 150-188 MG PACK Take 1 each by nebulization 2 (two) times daily. 56 each 5  . montelukast (SINGULAIR) 10 MG tablet Take 10 mg by mouth at bedtime.    . Multiple Vitamin (MULTIVITAMIN WITH MINERALS) TABS tablet Take 2 tablets by mouth daily.    Marland Kitchen tobramycin, PF, (TOBI) 300 MG/5ML nebulizer solution Take 5 mLs (300 mg total) by nebulization every 12 (twelve) hours. 280 mL 3  . SUMAtriptan (IMITREX) 25  MG tablet Take 1 tablet (25 mg total) by mouth daily. May take one more tablet two hours after the first. No more than two tablets per day. 30 tablet 2  . topiramate (TOPAMAX) 25 MG tablet Take 1 tablet (25 mg total) by mouth at bedtime. 30 tablet 2   No facility-administered medications prior to visit.     Allergies  Allergen Reactions  . Cheese Shortness Of Breath  . Chocolate Shortness Of Breath  . Ibuprofen Hives and Shortness Of Breath    Children's ibuprofen  . Ivp Dye [Iodinated Diagnostic Agents] Shortness Of Breath  . Latex Anaphylaxis, Swelling and Other (See Comments)    Reaction:  Localized swelling   . Orange Juice [Orange Oil] Shortness Of Breath  . Other Hives and Other (See Comments)    Pt states that she is allergic to all steroids except IV solu-medrol.    Marland Kitchen Peach [Prunus Persica] Anaphylaxis  . Peanuts [Peanut Oil] Anaphylaxis  . Pear Anaphylaxis  . Prednisone Hives  . Raspberry Anaphylaxis  . Tylenol [Acetaminophen] Hives, Shortness Of Breath and Other (See  Comments)    Pt states that this is only with the liquid form  . Amoxicillin Hives and Other (See Comments)    Has patient had a PCN reaction causing immediate rash, facial/tongue/throat swelling, SOB or lightheadedness with hypotension: No Has patient had a PCN reaction causing severe rash involving mucus membranes or skin necrosis: No Has patient had a PCN reaction that required hospitalization: No Has patient had a PCN reaction occurring within the last 10 years: No If all of the above answers are "NO", then may proceed with Cephalosporin use.  Marland Kitchen Apricot Flavor Hives  . Doxycycline Hives  . Erythromycin Hives  . Milk Of Magnesia [Magnesium Hydroxide] Hives and Itching  . Penicillins Hives and Other (See Comments)    Has patient had a PCN reaction causing immediate rash, facial/tongue/throat swelling, SOB or lightheadedness with hypotension: No Has patient had a PCN reaction causing severe rash involving mucus membranes or skin necrosis: No Has patient had a PCN reaction that required hospitalization: No Has patient had a PCN reaction occurring within the last 10 years: No If all of the above answers are "NO", then may proceed with Cephalosporin use.       Objective:    BP (!) 148/86 (BP Location: Left Arm, Patient Position: Sitting, Cuff Size: Normal)   Pulse (!) 103   Temp 98.2 F (36.8 C) (Oral)   Ht 5\' 6"  (1.676 m)   Wt 179 lb 6.4 oz (81.4 kg)   LMP 12/25/2017 (Approximate)   SpO2 100%   BMI 28.96 kg/m  Wt Readings from Last 3 Encounters:  01/06/18 179 lb 6.4 oz (81.4 kg)  12/02/17 180 lb 12.4 oz (82 kg)  08/20/17 174 lb 12.8 oz (79.3 kg)    Physical Exam Vitals signs and nursing note reviewed.  Constitutional:      Appearance: She is well-developed.  HENT:     Head: Normocephalic and atraumatic.  Neck:     Musculoskeletal: Normal range of motion.  Cardiovascular:     Rate and Rhythm: Normal rate and regular rhythm.     Heart sounds: Normal heart sounds. No  murmur. No friction rub. No gallop.   Pulmonary:     Effort: Pulmonary effort is normal. No tachypnea or respiratory distress.     Breath sounds: Normal breath sounds. No decreased breath sounds, wheezing, rhonchi or rales.  Chest:     Chest  wall: No tenderness.  Abdominal:     General: Bowel sounds are normal.     Palpations: Abdomen is soft.  Musculoskeletal: Normal range of motion.  Skin:    General: Skin is warm and dry.     Findings: Rash is not macular or papular.  Neurological:     Mental Status: She is alert and oriented to person, place, and time.     Cranial Nerves: Cranial nerves are intact.     Sensory: Sensation is intact.     Motor: Motor function is intact.     Coordination: Coordination is intact. Coordination normal.  Psychiatric:        Behavior: Behavior normal. Behavior is cooperative.        Thought Content: Thought content normal.        Judgment: Judgment normal.        Patient has been counseled extensively about nutrition and exercise as well as the importance of adherence with medications and regular follow-up. The patient was given clear instructions to go to ER or return to medical center if symptoms don't improve, worsen or new problems develop. The patient verbalized understanding.   Follow-up: No follow-ups on file.   Gildardo Pounds, FNP-BC [facility] [place], [place]   01/06/2018, 9:01 PM

## 2018-01-06 NOTE — Patient Instructions (Addendum)
[facility] Neurologist DirectionsWebsite Address: [address] [place], [place] [address] Phone: [phone] Hours: Open  Closes 5 PM   Dr. Marshell Garfinkel PULMONOLOGY Internal medicine [address] [place], [place] [address] [phone]

## 2018-01-15 DIAGNOSIS — R0602 Shortness of breath: Secondary | ICD-10-CM | POA: Diagnosis not present

## 2018-01-15 DIAGNOSIS — R569 Unspecified convulsions: Secondary | ICD-10-CM | POA: Diagnosis not present

## 2018-01-15 DIAGNOSIS — R2689 Other abnormalities of gait and mobility: Secondary | ICD-10-CM | POA: Diagnosis not present

## 2018-01-15 DIAGNOSIS — G4739 Other sleep apnea: Secondary | ICD-10-CM | POA: Diagnosis not present

## 2018-01-15 DIAGNOSIS — S0083XA Contusion of other part of head, initial encounter: Secondary | ICD-10-CM | POA: Diagnosis not present

## 2018-01-15 DIAGNOSIS — J45901 Unspecified asthma with (acute) exacerbation: Secondary | ICD-10-CM | POA: Diagnosis not present

## 2018-01-15 DIAGNOSIS — J45909 Unspecified asthma, uncomplicated: Secondary | ICD-10-CM | POA: Diagnosis not present

## 2018-02-04 ENCOUNTER — Telehealth: Payer: Self-pay | Admitting: Physician Assistant

## 2018-02-15 DIAGNOSIS — J45901 Unspecified asthma with (acute) exacerbation: Secondary | ICD-10-CM | POA: Diagnosis not present

## 2018-02-15 DIAGNOSIS — J45909 Unspecified asthma, uncomplicated: Secondary | ICD-10-CM | POA: Diagnosis not present

## 2018-02-15 DIAGNOSIS — S0083XA Contusion of other part of head, initial encounter: Secondary | ICD-10-CM | POA: Diagnosis not present

## 2018-02-15 DIAGNOSIS — R0602 Shortness of breath: Secondary | ICD-10-CM | POA: Diagnosis not present

## 2018-02-15 DIAGNOSIS — R2689 Other abnormalities of gait and mobility: Secondary | ICD-10-CM | POA: Diagnosis not present

## 2018-02-15 DIAGNOSIS — G4739 Other sleep apnea: Secondary | ICD-10-CM | POA: Diagnosis not present

## 2018-02-15 DIAGNOSIS — R569 Unspecified convulsions: Secondary | ICD-10-CM | POA: Diagnosis not present

## 2018-02-19 ENCOUNTER — Telehealth: Payer: Self-pay | Admitting: Physician Assistant

## 2018-02-19 NOTE — Telephone Encounter (Signed)
Mali with [facility] [phone] called checking status of order sent for provider [facility] signature. Medicaid oxygen and supplies. NCD may request prior approval per heading on request. Date of recert 08/17/1672 on form. Please confirm receipt and sign and fax back.

## 2018-02-20 NOTE — Telephone Encounter (Signed)
Will route to PCP 

## 2018-02-27 ENCOUNTER — Emergency Department (HOSPITAL_COMMUNITY): Payer: Medicaid Other

## 2018-02-27 ENCOUNTER — Emergency Department (HOSPITAL_BASED_OUTPATIENT_CLINIC_OR_DEPARTMENT_OTHER)
Admit: 2018-02-27 | Discharge: 2018-02-27 | Disposition: A | Discharge status: Home / Self Care | Payer: Medicaid Other | Attending: Emergency Medicine | Admitting: Emergency Medicine

## 2018-02-27 ENCOUNTER — Emergency Department (HOSPITAL_COMMUNITY)
Admission: EM | Admit: 2018-02-27 | Discharge: 2018-02-27 | Disposition: A | Discharge status: Home / Self Care | Payer: Medicaid Other | Attending: Emergency Medicine | Admitting: Emergency Medicine

## 2018-02-27 DIAGNOSIS — Z79899 Other long term (current) drug therapy: Secondary | ICD-10-CM | POA: Diagnosis not present

## 2018-02-27 DIAGNOSIS — R002 Palpitations: Secondary | ICD-10-CM | POA: Insufficient documentation

## 2018-02-27 DIAGNOSIS — J45909 Unspecified asthma, uncomplicated: Secondary | ICD-10-CM | POA: Diagnosis not present

## 2018-02-27 DIAGNOSIS — M79604 Pain in right leg: Secondary | ICD-10-CM | POA: Insufficient documentation

## 2018-02-27 DIAGNOSIS — R52 Pain, unspecified: Secondary | ICD-10-CM | POA: Diagnosis not present

## 2018-02-27 DIAGNOSIS — Z87891 Personal history of nicotine dependence: Secondary | ICD-10-CM | POA: Diagnosis not present

## 2018-02-27 DIAGNOSIS — Z91010 Allergy to peanuts: Secondary | ICD-10-CM | POA: Diagnosis not present

## 2018-02-27 DIAGNOSIS — R000 Tachycardia, unspecified: Secondary | ICD-10-CM | POA: Diagnosis not present

## 2018-02-27 DIAGNOSIS — Z91040 Latex allergy status: Secondary | ICD-10-CM | POA: Insufficient documentation

## 2018-02-27 LAB — I-STAT TROPONIN, ED
Troponin i, poc: 0 ng/mL (ref 0.00–0.08)
Troponin i, poc: 0.01 ng/mL (ref 0.00–0.08)

## 2018-02-27 LAB — I-STAT BETA HCG BLOOD, ED (MC, WL, AP ONLY): I-stat hCG, quantitative: <5 m[IU]/mL (ref <5)

## 2018-02-27 LAB — BRAIN NATRIURETIC PEPTIDE: B NATRIURETIC PEPTIDE 5: 18.5 pg/mL (ref 0.0–100.0)

## 2018-02-27 LAB — BASIC METABOLIC PANEL
Anion gap: 11 (ref 5–15)
BUN: 5 mg/dL — ABNORMAL LOW (ref 6–20)
CO2: 21 mmol/L — AB (ref 22–32)
Calcium: 9.7 mg/dL (ref 8.9–10.3)
Chloride: 105 mmol/L (ref 98–111)
Creatinine, Ser: 0.77 mg/dL (ref 0.44–1.00)
GFR calc Af Amer: >60 mL/min (ref >60)
GFR calc non Af Amer: >60 mL/min (ref >60)
GLUCOSE: 115 mg/dL — AB (ref 70–99)
Potassium: 3 mmol/L — ABNORMAL LOW (ref 3.5–5.1)
Sodium: 137 mmol/L (ref 135–145)

## 2018-02-27 LAB — CBC
HCT: 36.9 % (ref 36.0–46.0)
Hemoglobin: 11.9 g/dL — ABNORMAL LOW (ref 12.0–15.0)
MCH: 27.2 pg (ref 26.0–34.0)
MCHC: 32.2 g/dL (ref 30.0–36.0)
MCV: 84.4 fL (ref 80.0–100.0)
Platelets: 254 10*3/uL (ref 150–400)
RBC: 4.37 MIL/uL (ref 3.87–5.11)
RDW: 16 % — ABNORMAL HIGH (ref 11.5–15.5)
WBC: 10.7 10*3/uL — ABNORMAL HIGH (ref 4.0–10.5)
nRBC: 0 % (ref 0.0–0.2)

## 2018-02-27 LAB — CK: Total CK: 179 U/L (ref 38–234)

## 2018-02-27 LAB — D-DIMER, QUANTITATIVE: D-Dimer, Quant: 0.29 ug/mL-FEU (ref 0.00–0.50)

## 2018-02-27 MED ORDER — OXYCODONE HCL 5 MG PO TABS
5.0000 mg | ORAL_TABLET | Freq: Once | ORAL | Status: AC
  Administered 2018-02-27: 5 mg via ORAL
  Filled 2018-02-27: qty 1

## 2018-02-27 MED ORDER — METHYLPREDNISOLONE SODIUM SUCC 125 MG IJ SOLR
60.0000 mg | Freq: Once | INTRAMUSCULAR | Status: AC
  Administered 2018-02-27: 60 mg via INTRAVENOUS
  Filled 2018-02-27: qty 2

## 2018-02-27 MED ORDER — SODIUM CHLORIDE 0.9% FLUSH: 3.0000 mL | Freq: Once | INTRAVENOUS | Status: DC

## 2018-02-27 MED ORDER — IPRATROPIUM-ALBUTEROL 0.5-2.5 (3) MG/3ML IN SOLN
3.0000 mL | Freq: Once | RESPIRATORY_TRACT | Status: AC
  Administered 2018-02-27: 3 mL via RESPIRATORY_TRACT
  Filled 2018-02-27 (×2): qty 3

## 2018-02-27 MED ORDER — SODIUM CHLORIDE 0.9 % IV BOLUS
1000.0000 mL | Freq: Once | INTRAVENOUS | Status: AC
  Administered 2018-02-27: 1000 mL via INTRAVENOUS

## 2018-02-27 MED ORDER — METHYLPREDNISOLONE 4 MG PO TBPK: ORAL_TABLET | ORAL | 0 refills | Status: DC

## 2018-02-27 MED ORDER — POTASSIUM CHLORIDE CRYS ER 20 MEQ PO TBCR
40.0000 meq | EXTENDED_RELEASE_TABLET | Freq: Once | ORAL | Status: AC
  Administered 2018-02-27: 40 meq via ORAL
  Filled 2018-02-27: qty 2

## 2018-02-27 MED ORDER — FENTANYL CITRATE (PF) 100 MCG/2ML IJ SOLN
50.0000 ug | Freq: Once | INTRAMUSCULAR | Status: AC
  Administered 2018-02-27: 50 ug via INTRAVENOUS
  Filled 2018-02-27: qty 2

## 2018-02-27 NOTE — ED Notes (Signed)
Pt called out stating her ride is here and she is going to leave. MD aware and will come speak with patient prior to leaving. Pt updated and states it does not change her decision to leave because she has to go pick up her daughter.

## 2018-02-27 NOTE — ED Triage Notes (Signed)
Pt reports R leg pain X2 days and chest palpitations X1 day. Pt noted to be tachycardic in triage, HR 140's. Has hx of DVT, Cystic Fibrosis, Asthma. Wears chronic O2, sats 100%.

## 2018-02-27 NOTE — ED Provider Notes (Signed)
[facility] Provider Note   CSN: [id number] Arrival date & time: 02/27/18  0120     History   Chief Complaint Chief Complaint  Patient presents with  . Leg Pain  . Tachycardia    HPI Jasmine [name] is a 28 y.o. female.  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  She reports pain in her right leg starting in her calf radiating up to her thigh for the past 2 days without fall or trauma.  She reports fluttering feeling in her chest with tightness over the past day as well.  Denies any change in her chronic shortness of breath and chronic cough.  No fever.  She is been using her inhalers at home and feels like her breathing is normal.  No abdominal pain, nausea or vomiting.  She is no longer on anticoagulation for her DVT.  The history is provided by the patient.  Leg Pain  Associated symptoms: no fever     Past Medical History:  Diagnosis Date  . Asthma   . Complication of anesthesia    "I wake up during anesthesia" (10/14/2015)  . Cystic fibrosis ([place])    O2 dependent-  very uncertain diagnosis!  Marland Kitchen DVT (deep vein thrombosis) in pregnancy   . Epilepsy ([place])   . GERD (gastroesophageal reflux disease)   . Heart murmur    last work up age 75- no symtoms  . On home oxygen therapy    "2L when I sleep" (07/18/2017)  . Pancreatitis   . Sickle cell trait ([place])   . Sleep apnea    "used to wear CPAP; don't have one here in [place] since I moved in 2014" (10/14/2015)    Patient Active Problem List   Diagnosis Date Noted  . Asthma exacerbation 12/01/2017  . Acute hypercapnic respiratory failure ([place]) 12/01/2017  . Exocrine pancreatic insufficiency 12/01/2017  . Facial contusion 07/17/2017  . Closed dislocation of right jaw 07/17/2017  . Nausea with hives 07/09/2017  . History of DVT (deep vein thrombosis) in pregnancy ([place])  05/29/2017  . Sickle cell trait ([place]) 05/29/2017  . Acute asthma exacerbation 05/29/2017  . Chronic pancreatitis ([place]) 05/29/2017  . Seizures ([place]) 05/29/2017  . Tobacco abuse 05/29/2017  . Acute on chronic respiratory failure with hypoxemia ([place]) 05/29/2017  . SOB (shortness of breath) 04/08/2017  . Cough 04/07/2017  . Cystic fibrosis ([place]) 03/10/2017  . Acute on chronic respiratory failure with hypoxia ([place]) 03/10/2017  . Colitis 11/12/2016  . Seizure ([place]) 11/12/2016  . Calculus of bile duct with acute cholecystitis with obstruction 10/14/2015  . Adjustment disorder with mixed anxiety and depressed mood 04/26/2015  . Low blood potassium 04/19/2015  . Asthma with acute exacerbation 04/19/2015  . Asthma 01/03/2015  . Asthma, chronic, unspecified asthma severity, with acute exacerbation 01/03/2015  . Influenza-like illness 01/03/2015  . Status post cesarean section 11/08/2014  . Previous cesarean section complicating pregnancy, antepartum condition or complication   . GERD (gastroesophageal reflux disease)   . Gastroesophageal reflux disease without esophagitis   . Abnormal biochemical finding on antenatal screening of mother   . Abnormal quad screen   . Echogenic focus of heart of fetus affecting antepartum care of mother   . Drug use affecting pregnancy, antepartum 05/19/2014  . Seizure disorder during pregnancy, antepartum ([place]) 05/13/2014  . Supervision of high risk pregnancy, antepartum 05/13/2014  . Asthma  complicating pregnancy, antepartum 05/13/2014  . History of food anaphylaxis 05/13/2014  . Seizure disorder, sept 2015 last seizure 01/19/2013  . Anemia 01/19/2013    Past Surgical History:  Procedure Laterality Date  . APPENDECTOMY    . CESAREAN SECTION  2013  . CESAREAN SECTION N/A 11/08/2014   Procedure: CESAREAN SECTION;  Surgeon: Truett Mainland, DO;  Location: [facility];  Service: Obstetrics;  Laterality: N/A;  . CHOLECYSTECTOMY N/A 10/16/2015   Procedure:  LAPAROSCOPIC CHOLECYSTECTOMY WITH POSSIBLE INTRAOPERATIVE CHOLANGIOGRAM;  Surgeon: Donnie Mesa, MD;  Location: [facility];  Service: General;  Laterality: N/A;  . CLOSED REDUCTION MANDIBLE WITH MANDIBULOMA N/A 07/17/2017   Procedure: REDUCTION TMJ WITH INTRAMAXILLARY FIXATION;  Surgeon: Diona Browner, DDS;  Location: [facility];  Service: Oral Surgery;  Laterality: N/A;  . ESOPHAGOGASTRODUODENOSCOPY (EGD) WITH PROPOFOL N/A 11/15/2016   Procedure: ESOPHAGOGASTRODUODENOSCOPY (EGD) WITH PROPOFOL;  Surgeon: Clarene Essex, MD;  Location: [facility];  Service: Endoscopy;  Laterality: N/A;  . INGUINAL HERNIA REPAIR Bilateral ~ 1996  . NERVE, TENDON AND ARTERY REPAIR Right 09/23/2012   Procedure: I&D and Repair As Necessary/Right Hand and Palm;  Surgeon: Roseanne Kaufman, MD;  Location: [facility];  Service: Orthopedics;  Laterality: Right;  . TONSILLECTOMY       OB History    Gravida  3   Para  3   Term  3   Preterm      AB      Living  3     SAB      TAB      Ectopic      Multiple  0   Live Births  3            Home Medications    Prior to Admission medications   Medication Sig Start Date End Date Taking? Authorizing Provider  albuterol (PROVENTIL HFA;VENTOLIN HFA) 108 (90 Base) MCG/ACT inhaler Inhale 2 puffs into the lungs every 4 (four) hours as needed for wheezing or shortness of breath. 08/20/17   Clent Demark, PA-C  Amylase-Lipase-Protease (CREON 20 PO) Take 2-7 capsules by mouth See admin instructions. Take 2-7 capsules by mouth with each meal and snack (dose based on amount of food intake)    [provider]  Ascorbic Acid (VITAMIN C) 100 MG CHEW Chew 1 tablet (100 mg total) by mouth daily. 08/20/17   Clent Demark, PA-C  cetirizine (ZYRTEC) 10 MG tablet Take 1 tablet (10 mg total) by mouth daily. 12/11/17   Tasia Catchings, Amy V, PA-C  docusate sodium (COLACE) 50 MG capsule Take 1 capsule (50 mg total) by mouth 2 (two) times daily as needed for mild constipation. 08/20/17   Clent Demark, PA-C  dornase alpha (PULMOZYME) 1 MG/ML nebulizer solution Take 2.5 mLs (2.5 mg total) by nebulization 2 (two) times daily. 08/20/17   Clent Demark, PA-C  feeding supplement, ENSURE ENLIVE, (ENSURE ENLIVE) LIQD Take 237 mLs by mouth 2 (two) times daily between meals. 474 mL 3 times a day, provide 3-week supply 08/20/17   Clent Demark, PA-C  Fluticasone-Salmeterol (ADVAIR) 500-50 MCG/DOSE AEPB Inhale 1 puff into the lungs 2 (two) times daily. 08/20/17   Clent Demark, PA-C  HYDROcodone-acetaminophen (NORCO/VICODIN) 5-325 MG tablet Take 1 tablet by mouth every 6 (six) hours as needed for moderate pain (pain). 07/23/17   Thurnell Lose, MD  levETIRAcetam (KEPPRA) 750 MG tablet Take 1 tablet (750 mg total) by mouth 2 (two) times daily. 08/20/17   Clent Demark,  PA-C  Lumacaftor-Ivacaftor 150-188 MG PACK Take 1 each by nebulization 2 (two) times daily. 08/20/17   Clent Demark, PA-C  montelukast (SINGULAIR) 10 MG tablet Take 10 mg by mouth at bedtime.    [provider]  Multiple Vitamin (MULTIVITAMIN WITH MINERALS) TABS tablet Take 2 tablets by mouth daily.    [provider]  SUMAtriptan (IMITREX) 50 MG tablet Take 1 tablet (50 mg total) by mouth daily. May take one more tablet two hours after the first. No more than two tablets per day. 01/06/18   Gildardo Pounds, NP  tobramycin, PF, (TOBI) 300 MG/5ML nebulizer solution Take 5 mLs (300 mg total) by nebulization every 12 (twelve) hours. 08/20/17   Clent Demark, PA-C  topiramate (TOPAMAX) 50 MG tablet Take 1 tablet (50 mg total) by mouth at bedtime. 01/06/18 02/05/18  Gildardo Pounds, NP    Family History Family History  Problem Relation Age of Onset  . Cancer Mother        cervical cancer  . Asthma Mother   . Hypertension Father   . Sickle cell anemia Father   . Asthma Sister   . Diabetes Maternal Aunt   . Cancer Maternal Grandmother     Social History Social History   Tobacco Use  .  Smoking status: Former Smoker    Packs/day: 0.25    Years: 4.00    Pack years: 1.00    Types: Cigarettes    Last attempt to quit: 10/02/2015    Years since quitting: 2.4  . Smokeless tobacco: Never Used  Substance Use Topics  . Alcohol use: No    Alcohol/week: 0.0 standard drinks    Frequency: Never  . Drug use: No     Allergies   Cheese; Chocolate; Ibuprofen; Ivp dye [iodinated diagnostic agents]; Latex; Orange juice [orange oil]; Other; Peach [prunus persica]; Peanuts [peanut oil]; Pear; Prednisone; Raspberry; Tylenol [acetaminophen]; Amoxicillin; Apricot flavor; Doxycycline; Erythromycin; Milk of magnesia [magnesium hydroxide]; and Penicillins   Review of Systems Review of Systems  Constitutional: Negative for activity change, appetite change and fever.  HENT: Negative for congestion and rhinorrhea.   Eyes: Negative for visual disturbance.  Respiratory: Positive for chest tightness. Negative for shortness of breath.   Cardiovascular: Positive for palpitations and leg swelling.  Gastrointestinal: Negative for abdominal pain, nausea and vomiting.  Genitourinary: Negative for dysuria and hematuria.  Musculoskeletal: Positive for arthralgias and myalgias.  Neurological: Negative for dizziness, weakness and headaches.   all other systems are negative except as noted in the HPI and PMH.     Physical Exam Updated Vital Signs BP (!) 144/78 (BP Location: Left Arm)   Pulse (!) 140   Temp 98.6 F (37 C) (Oral)   Resp 18   SpO2 100%   Physical Exam Vitals signs and nursing note reviewed.  Constitutional:      General: She is not in acute distress.    Appearance: She is well-developed.     Comments: Chronically ill-appearing  HENT:     Head: Normocephalic and atraumatic.     Mouth/Throat:     Pharynx: No oropharyngeal exudate.  Eyes:     Conjunctiva/sclera: Conjunctivae normal.     Pupils: Pupils are equal, round, and reactive to light.  Neck:     Musculoskeletal:  Normal range of motion and neck supple.     Comments: No meningismus. Cardiovascular:     Rate and Rhythm: Regular rhythm. Tachycardia present.     Heart sounds: Normal heart  sounds. No murmur.     Comments: Tachycardia 120s to 130s Pulmonary:     Effort: Pulmonary effort is normal. No respiratory distress.     Breath sounds: Wheezing present.     Comments: Moderate air exchange, expiratory expiratory wheezing bilaterally Abdominal:     Palpations: Abdomen is soft.     Tenderness: There is no abdominal tenderness. There is no guarding or rebound.  Musculoskeletal: Normal range of motion.        General: Swelling and tenderness present.     Right lower leg: No edema.     Left lower leg: No edema.     Comments: Right calf tenderness without appreciable asymmetry or edema.  Full range of motion of right hip, knee and ankle. Intact DP and PT pulse.  Compartments soft.  Skin:    General: Skin is warm.     Capillary Refill: Capillary refill takes less than 2 seconds.  Neurological:     General: No focal deficit present.     Mental Status: She is alert and oriented to person, place, and time. Mental status is at baseline.     Cranial Nerves: No cranial nerve deficit.     Motor: No abnormal muscle tone.     Coordination: Coordination normal.     Comments:  5/5 strength throughout. CN 2-12 intact.Equal grip strength.   Psychiatric:        Behavior: Behavior normal.      ED Treatments / Results  Labs (all labs ordered are listed, but only abnormal results are displayed) Labs Reviewed  BASIC METABOLIC PANEL - Abnormal; Notable for the following components:      Result Value   Potassium 3.0 (*)    CO2 21 (*)    Glucose, Bld 115 (*)    BUN 5 (*)    All other components within normal limits  CBC - Abnormal; Notable for the following components:   WBC 10.7 (*)    Hemoglobin 11.9 (*)    RDW 16.0 (*)    All other components within normal limits  D-DIMER, QUANTITATIVE (NOT AT [facility])    BRAIN NATRIURETIC PEPTIDE  CK  I-STAT BETA HCG BLOOD, ED ([facility], [facility], [facility] ONLY)  I-STAT TROPONIN, ED    EKG EKG Interpretation  Date/Time:  Thursday February 27 2018 01:29:57 EST Ventricular Rate:  124 PR Interval:  138 QRS Duration: 84 QT Interval:  328 QTC Calculation: 471 R Axis:   43 Text Interpretation:  Sinus tachycardia ST & T wave abnormality, consider inferior ischemia ST & T wave abnormality, consider anterolateral ischemia Abnormal ECG similar ST depressions to previous, slightly worse Confirmed by Ezequiel Essex [phone]) on 02/27/2018 2:19:24 AM   Radiology Dg Chest 2 View  Result Date: 02/27/2018 CLINICAL DATA:  Chest palpitations, tachycardia EXAM: CHEST - 2 VIEW COMPARISON:  12/01/2017 FINDINGS: Heart and mediastinal contours are within normal limits. No focal opacities or effusions. No acute bony abnormality. IMPRESSION: No active cardiopulmonary disease. Electronically Signed   By: Rolm Baptise M.D.   On: 02/27/2018 02:00    Procedures Procedures (including critical care time)  Medications Ordered in ED Medications  sodium chloride flush (NS) 0.9 % injection 3 mL (has no administration in time range)  sodium chloride 0.9 % bolus 1,000 mL (has no administration in time range)     Initial Impression / Assessment and Plan / ED Course  I have reviewed the triage vital signs and the nursing notes.  Pertinent labs & imaging results that were  available during my care of the patient were reviewed by me and considered in my medical decision making (see chart for details).    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  Denies any change in her chronic shortness of breath and cough.  Has palpitations and tightness in her chest.  Tachycardic in the 130s and 140s.  Patient given IV fluids and pain control.  She is neurovascularly intact without obvious asymmetry to her legs.  She is given nebulizer  and steroids for wheezing.  Her chest x-ray is stable.  Labs are reassuring with stable creatinine and normal CK.  D-dimer is negative.  However patient has had multiple clots in the past so she is at least intermediate risk and d-dimer does not likely apply.  Heart rate has improved to the 80s.  She is breathing comfortably on 2 L in no distress. Her leg pain is improved.  Plan to obtain ultrasound Doppler to rule out DVT as well as VQ scan given her contrast allergy.  Care to be transferred at shift change. Anticipate discharge home with steroid taper if studies reassuring.  Dr. Wilson Singer to assume care at shift change.   Final Clinical Impressions(s) / ED Diagnoses   Final diagnoses:  Right leg pain  Palpitations    ED Discharge Orders    None       Gevorg Brum, Annie Main, MD 02/27/18 [phone]

## 2018-02-27 NOTE — Progress Notes (Signed)
Right lower extremity venous duplex has been completed. Preliminary results can be found in CV Proc through chart review.  Results were given to Dr. Wilson Singer.  02/27/18 8:18 AM Jasmine [name] RVT

## 2018-02-27 NOTE — ED Notes (Signed)
Patient verbalizes understanding of discharge instructions. Opportunity for questioning and answers were provided. Armband removed by staff, pt discharged from ED ambulatory.   

## 2018-02-27 NOTE — ED Provider Notes (Signed)
Pt having to leave AMA. Has been in ED over 10 hours. Unfortunately still hasn't had VQ scan. Pt has to leave for childcare reasons and can't quickly make alternative arrangement. D-dimer negative make PE unlikely, but felt not to be low risk. She had no additional questions for me.    Virgel Manifold, MD 02/27/18 1149

## 2018-02-27 NOTE — ED Notes (Signed)
Unable to establish peripheral IV despite several attempts -IV team consult ordered.

## 2018-03-16 DIAGNOSIS — G4739 Other sleep apnea: Secondary | ICD-10-CM | POA: Diagnosis not present

## 2018-03-16 DIAGNOSIS — R2689 Other abnormalities of gait and mobility: Secondary | ICD-10-CM | POA: Diagnosis not present

## 2018-03-16 DIAGNOSIS — R569 Unspecified convulsions: Secondary | ICD-10-CM | POA: Diagnosis not present

## 2018-03-16 DIAGNOSIS — J45909 Unspecified asthma, uncomplicated: Secondary | ICD-10-CM | POA: Diagnosis not present

## 2018-03-16 DIAGNOSIS — S0083XA Contusion of other part of head, initial encounter: Secondary | ICD-10-CM | POA: Diagnosis not present

## 2018-03-16 DIAGNOSIS — J45901 Unspecified asthma with (acute) exacerbation: Secondary | ICD-10-CM | POA: Diagnosis not present

## 2018-04-13 ENCOUNTER — Emergency Department (HOSPITAL_COMMUNITY)
Admission: EM | Admit: 2018-04-13 | Discharge: 2018-04-13 | Disposition: A | Discharge status: Home / Self Care | Payer: Medicaid Other | Attending: Emergency Medicine | Admitting: Emergency Medicine

## 2018-04-13 ENCOUNTER — Other Ambulatory Visit: Payer: Self-pay

## 2018-04-13 ENCOUNTER — Encounter (HOSPITAL_COMMUNITY): Payer: Self-pay | Admitting: Emergency Medicine

## 2018-04-13 DIAGNOSIS — R569 Unspecified convulsions: Secondary | ICD-10-CM | POA: Insufficient documentation

## 2018-04-13 DIAGNOSIS — Z5321 Procedure and treatment not carried out due to patient leaving prior to being seen by health care provider: Secondary | ICD-10-CM | POA: Diagnosis not present

## 2018-04-13 NOTE — ED Triage Notes (Signed)
Pt reports having a witness seizure by family member today approximately 20-30 minutes ago, pt unsure of how long it lasted. Pt reports she last had a seizure 2 weeks ago. Pt reports taking her medication a couple hours later than normal. Pt reports feeling fatigue.

## 2018-04-13 NOTE — ED Notes (Signed)
Pt seen leaving the emergency department, pt did not let anyone know.

## 2018-04-13 NOTE — ED Provider Notes (Cosign Needed)
Patient eloped from the emergency department before provider evaluation. I did not see or evaluate this patient. Triage note states patient reported having witnessed seizure by family member today approximately 52 to 30 minutes prior to arrival.  1:47 AM Per RN, patient was brought in by her mother and was seen ambulating without difficulty out of the emergency department.  I did not have an opportunity to see, speak to or evaluate the patient before she eloped.    Abigail Butts, PA-C 04/13/18 0147

## 2018-04-16 DIAGNOSIS — G4739 Other sleep apnea: Secondary | ICD-10-CM | POA: Diagnosis not present

## 2018-04-16 DIAGNOSIS — R569 Unspecified convulsions: Secondary | ICD-10-CM | POA: Diagnosis not present

## 2018-04-16 DIAGNOSIS — J45901 Unspecified asthma with (acute) exacerbation: Secondary | ICD-10-CM | POA: Diagnosis not present

## 2018-04-16 DIAGNOSIS — J45909 Unspecified asthma, uncomplicated: Secondary | ICD-10-CM | POA: Diagnosis not present

## 2018-04-16 DIAGNOSIS — R2689 Other abnormalities of gait and mobility: Secondary | ICD-10-CM | POA: Diagnosis not present

## 2018-04-16 DIAGNOSIS — S0083XA Contusion of other part of head, initial encounter: Secondary | ICD-10-CM | POA: Diagnosis not present

## 2018-05-16 DIAGNOSIS — S0083XA Contusion of other part of head, initial encounter: Secondary | ICD-10-CM | POA: Diagnosis not present

## 2018-05-16 DIAGNOSIS — J45901 Unspecified asthma with (acute) exacerbation: Secondary | ICD-10-CM | POA: Diagnosis not present

## 2018-05-16 DIAGNOSIS — G4739 Other sleep apnea: Secondary | ICD-10-CM | POA: Diagnosis not present

## 2018-05-16 DIAGNOSIS — R2689 Other abnormalities of gait and mobility: Secondary | ICD-10-CM | POA: Diagnosis not present

## 2018-05-16 DIAGNOSIS — R569 Unspecified convulsions: Secondary | ICD-10-CM | POA: Diagnosis not present

## 2018-05-16 DIAGNOSIS — J45909 Unspecified asthma, uncomplicated: Secondary | ICD-10-CM | POA: Diagnosis not present

## 2018-06-16 DIAGNOSIS — J45901 Unspecified asthma with (acute) exacerbation: Secondary | ICD-10-CM | POA: Diagnosis not present

## 2018-06-16 DIAGNOSIS — G4739 Other sleep apnea: Secondary | ICD-10-CM | POA: Diagnosis not present

## 2018-06-16 DIAGNOSIS — R2689 Other abnormalities of gait and mobility: Secondary | ICD-10-CM | POA: Diagnosis not present

## 2018-06-16 DIAGNOSIS — J45909 Unspecified asthma, uncomplicated: Secondary | ICD-10-CM | POA: Diagnosis not present

## 2018-07-16 DIAGNOSIS — G4739 Other sleep apnea: Secondary | ICD-10-CM | POA: Diagnosis not present

## 2018-07-16 DIAGNOSIS — J45909 Unspecified asthma, uncomplicated: Secondary | ICD-10-CM | POA: Diagnosis not present

## 2018-07-16 DIAGNOSIS — J45901 Unspecified asthma with (acute) exacerbation: Secondary | ICD-10-CM | POA: Diagnosis not present

## 2018-07-16 DIAGNOSIS — R2689 Other abnormalities of gait and mobility: Secondary | ICD-10-CM | POA: Diagnosis not present

## 2018-08-16 DIAGNOSIS — J45901 Unspecified asthma with (acute) exacerbation: Secondary | ICD-10-CM | POA: Diagnosis not present

## 2018-08-16 DIAGNOSIS — R2689 Other abnormalities of gait and mobility: Secondary | ICD-10-CM | POA: Diagnosis not present

## 2018-08-16 DIAGNOSIS — J45909 Unspecified asthma, uncomplicated: Secondary | ICD-10-CM | POA: Diagnosis not present

## 2018-08-16 DIAGNOSIS — G4739 Other sleep apnea: Secondary | ICD-10-CM | POA: Diagnosis not present

## 2018-09-16 DIAGNOSIS — J45901 Unspecified asthma with (acute) exacerbation: Secondary | ICD-10-CM | POA: Diagnosis not present

## 2018-09-16 DIAGNOSIS — J45909 Unspecified asthma, uncomplicated: Secondary | ICD-10-CM | POA: Diagnosis not present

## 2018-09-16 DIAGNOSIS — G4739 Other sleep apnea: Secondary | ICD-10-CM | POA: Diagnosis not present

## 2018-09-16 DIAGNOSIS — R2689 Other abnormalities of gait and mobility: Secondary | ICD-10-CM | POA: Diagnosis not present

## 2018-09-25 ENCOUNTER — Emergency Department (HOSPITAL_COMMUNITY)
Admission: EM | Admit: 2018-09-25 | Discharge: 2018-09-25 | Discharge status: Against Medical Advice | Payer: Medicaid Other

## 2018-09-25 ENCOUNTER — Encounter (HOSPITAL_COMMUNITY): Payer: Self-pay | Admitting: Emergency Medicine

## 2018-09-25 ENCOUNTER — Other Ambulatory Visit: Payer: Self-pay

## 2018-09-25 LAB — CBC
HCT: 35.7 % — ABNORMAL LOW (ref 36.0–46.0)
Hemoglobin: 10.9 g/dL — ABNORMAL LOW (ref 12.0–15.0)
MCH: 26 pg (ref 26.0–34.0)
MCHC: 30.5 g/dL (ref 30.0–36.0)
MCV: 85.2 fL (ref 80.0–100.0)
Platelets: 260 10*3/uL (ref 150–400)
RBC: 4.19 MIL/uL (ref 3.87–5.11)
RDW: 15.7 % — ABNORMAL HIGH (ref 11.5–15.5)
WBC: 11.6 10*3/uL — ABNORMAL HIGH (ref 4.0–10.5)
nRBC: 0 % (ref 0.0–0.2)

## 2018-09-25 LAB — URINALYSIS, ROUTINE W REFLEX MICROSCOPIC
Bilirubin Urine: NEGATIVE
Glucose, UA: NEGATIVE mg/dL
Hgb urine dipstick: NEGATIVE
Ketones, ur: NEGATIVE mg/dL
Leukocytes,Ua: NEGATIVE
Nitrite: NEGATIVE
Protein, ur: NEGATIVE mg/dL
Specific Gravity, Urine: 1.004 — ABNORMAL LOW (ref 1.005–1.030)
pH: 6 (ref 5.0–8.0)

## 2018-09-25 LAB — I-STAT BETA HCG BLOOD, ED (MC, WL, AP ONLY): I-stat hCG, quantitative: <5 m[IU]/mL (ref <5)

## 2018-09-25 LAB — COMPREHENSIVE METABOLIC PANEL
ALT: 13 U/L (ref 0–44)
AST: 22 U/L (ref 15–41)
Albumin: 4 g/dL (ref 3.5–5.0)
Alkaline Phosphatase: 49 U/L (ref 38–126)
Anion gap: 9 (ref 5–15)
BUN: <5 mg/dL — ABNORMAL LOW (ref 6–20)
CO2: 26 mmol/L (ref 22–32)
Calcium: 9.2 mg/dL (ref 8.9–10.3)
Chloride: 103 mmol/L (ref 98–111)
Creatinine, Ser: 0.9 mg/dL (ref 0.44–1.00)
GFR calc Af Amer: >60 mL/min (ref >60)
GFR calc non Af Amer: >60 mL/min (ref >60)
Glucose, Bld: 101 mg/dL — ABNORMAL HIGH (ref 70–99)
Potassium: 3.1 mmol/L — ABNORMAL LOW (ref 3.5–5.1)
Sodium: 138 mmol/L (ref 135–145)
Total Bilirubin: 0.5 mg/dL (ref 0.3–1.2)
Total Protein: 6.7 g/dL (ref 6.5–8.1)

## 2018-09-25 LAB — LIPASE, BLOOD: Lipase: 25 U/L (ref 11–51)

## 2018-09-25 MED ORDER — SODIUM CHLORIDE 0.9% FLUSH: 3.0000 mL | Freq: Once | INTRAVENOUS | Status: DC

## 2018-09-25 NOTE — ED Notes (Signed)
PT left says she has to pickup daughter

## 2018-09-25 NOTE — ED Triage Notes (Signed)
Pt c/o LUQ pain x 3 days with nausea, denies vomiting. Also reports period was shorter than normal and she had spotting a few days after.

## 2018-10-03 ENCOUNTER — Encounter (HOSPITAL_COMMUNITY): Payer: Self-pay

## 2018-10-03 ENCOUNTER — Emergency Department (HOSPITAL_COMMUNITY)
Admission: EM | Admit: 2018-10-03 | Discharge: 2018-10-04 | Discharge status: Against Medical Advice | Payer: Medicaid Other | Attending: Emergency Medicine | Admitting: Emergency Medicine

## 2018-10-03 ENCOUNTER — Emergency Department (HOSPITAL_COMMUNITY): Payer: Medicaid Other

## 2018-10-03 DIAGNOSIS — R079 Chest pain, unspecified: Secondary | ICD-10-CM | POA: Diagnosis not present

## 2018-10-03 DIAGNOSIS — Z5321 Procedure and treatment not carried out due to patient leaving prior to being seen by health care provider: Secondary | ICD-10-CM | POA: Insufficient documentation

## 2018-10-03 DIAGNOSIS — R109 Unspecified abdominal pain: Secondary | ICD-10-CM | POA: Insufficient documentation

## 2018-10-03 DIAGNOSIS — R0789 Other chest pain: Secondary | ICD-10-CM | POA: Diagnosis present

## 2018-10-03 LAB — CBC
HCT: 35.5 % — ABNORMAL LOW (ref 36.0–46.0)
Hemoglobin: 11.4 g/dL — ABNORMAL LOW (ref 12.0–15.0)
MCH: 26.5 pg (ref 26.0–34.0)
MCHC: 32.1 g/dL (ref 30.0–36.0)
MCV: 82.6 fL (ref 80.0–100.0)
Platelets: 264 10*3/uL (ref 150–400)
RBC: 4.3 MIL/uL (ref 3.87–5.11)
RDW: 15.5 % (ref 11.5–15.5)
WBC: 11 10*3/uL — ABNORMAL HIGH (ref 4.0–10.5)
nRBC: 0 % (ref 0.0–0.2)

## 2018-10-03 LAB — I-STAT BETA HCG BLOOD, ED (MC, WL, AP ONLY): I-stat hCG, quantitative: <5 m[IU]/mL (ref <5)

## 2018-10-03 LAB — BASIC METABOLIC PANEL
Anion gap: 9 (ref 5–15)
BUN: <5 mg/dL — ABNORMAL LOW (ref 6–20)
CO2: 23 mmol/L (ref 22–32)
Calcium: 9 mg/dL (ref 8.9–10.3)
Chloride: 103 mmol/L (ref 98–111)
Creatinine, Ser: 0.79 mg/dL (ref 0.44–1.00)
GFR calc Af Amer: >60 mL/min (ref >60)
GFR calc non Af Amer: >60 mL/min (ref >60)
Glucose, Bld: 94 mg/dL (ref 70–99)
Potassium: 3.3 mmol/L — ABNORMAL LOW (ref 3.5–5.1)
Sodium: 135 mmol/L (ref 135–145)

## 2018-10-03 LAB — TROPONIN I (HIGH SENSITIVITY): Troponin I (High Sensitivity): 4 ng/L (ref <18)

## 2018-10-03 NOTE — ED Triage Notes (Signed)
Chest pain and abdominal pain that has not relieved.  Patient states she left two days ago thinking she would get better,k but decided to come back today.

## 2018-10-04 LAB — TROPONIN I (HIGH SENSITIVITY): Troponin I (High Sensitivity): 3 ng/L (ref <18)

## 2018-10-04 NOTE — ED Notes (Signed)
Patient called for vitals no answer X3

## 2018-10-05 ENCOUNTER — Encounter (HOSPITAL_COMMUNITY): Payer: Self-pay | Admitting: *Deleted

## 2018-10-05 ENCOUNTER — Emergency Department (HOSPITAL_COMMUNITY)
Admission: EM | Admit: 2018-10-05 | Discharge: 2018-10-05 | Disposition: A | Discharge status: Home / Self Care | Payer: Medicaid Other | Attending: Emergency Medicine | Admitting: Emergency Medicine

## 2018-10-05 ENCOUNTER — Other Ambulatory Visit: Payer: Self-pay

## 2018-10-05 DIAGNOSIS — D573 Sickle-cell trait: Secondary | ICD-10-CM | POA: Diagnosis not present

## 2018-10-05 DIAGNOSIS — Z91040 Latex allergy status: Secondary | ICD-10-CM | POA: Diagnosis not present

## 2018-10-05 DIAGNOSIS — N939 Abnormal uterine and vaginal bleeding, unspecified: Secondary | ICD-10-CM | POA: Insufficient documentation

## 2018-10-05 DIAGNOSIS — Z86718 Personal history of other venous thrombosis and embolism: Secondary | ICD-10-CM | POA: Insufficient documentation

## 2018-10-05 DIAGNOSIS — N390 Urinary tract infection, site not specified: Secondary | ICD-10-CM | POA: Insufficient documentation

## 2018-10-05 DIAGNOSIS — Z79899 Other long term (current) drug therapy: Secondary | ICD-10-CM | POA: Diagnosis not present

## 2018-10-05 DIAGNOSIS — Z87891 Personal history of nicotine dependence: Secondary | ICD-10-CM | POA: Insufficient documentation

## 2018-10-05 DIAGNOSIS — R102 Pelvic and perineal pain: Secondary | ICD-10-CM | POA: Diagnosis present

## 2018-10-05 LAB — URINALYSIS, ROUTINE W REFLEX MICROSCOPIC
Bilirubin Urine: NEGATIVE
Glucose, UA: NEGATIVE mg/dL
Ketones, ur: 5 mg/dL — AB
Nitrite: NEGATIVE
Protein, ur: 30 mg/dL — AB
Specific Gravity, Urine: >1.046 — ABNORMAL HIGH (ref 1.005–1.030)
pH: 5 (ref 5.0–8.0)

## 2018-10-05 LAB — POC URINE PREG, ED: Preg Test, Ur: NEGATIVE

## 2018-10-05 MED ORDER — NITROFURANTOIN MONOHYD MACRO 100 MG PO CAPS: 100.0000 mg | ORAL_CAPSULE | Freq: Two times a day (BID) | ORAL | 0 refills | Status: DC

## 2018-10-05 NOTE — ED Provider Notes (Signed)
[facility] EMERGENCY DEPARTMENT Provider Note   CSN: [id number] Arrival date & time: 10/05/18  1530     History   Chief Complaint Chief Complaint  Patient presents with  . Pelvic Pain    HPI Jasmine [name] is a 28 y.o. female with past medical history of asthma, chronic pancreatitis, seizure disorder, presenting to the emergency department with complaint of 3 days of lower pelvic cramping.  She states 3 days ago she had some very light vaginal spotting described as a pinkish/brown color.  She states her LMP was August 31 and she is not due for her next period until 10/13/2018.  Has had no tenderness, nausea, vomiting, diarrhea or constipation.  She denies abnormal vaginal discharge.  Does report increased urinary frequency.  She is sexually active with female partners without protection.     The history is provided by the patient.    Past Medical History:  Diagnosis Date  . Asthma   . Complication of anesthesia    "I wake up during anesthesia" (10/14/2015)  . 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  Marland Kitchen DVT (deep vein thrombosis) in pregnancy   . Epilepsy ([place])   . GERD (gastroesophageal reflux disease)   . Heart murmur    last work up age 28- no symtoms  . On home oxygen therapy    "2L when I sleep" (07/18/2017)  . Pancreatitis   . Sickle cell trait ([place])   . Sleep apnea    "used to wear CPAP; don't have one here in [place] since I moved in 2014" (10/14/2015)    Patient Active Problem List   Diagnosis Date Noted  . Asthma exacerbation 12/01/2017  . Acute hypercapnic respiratory failure ([place]) 12/01/2017  . Exocrine pancreatic insufficiency 12/01/2017  . Facial contusion 07/17/2017  . Closed dislocation of right jaw 07/17/2017  . Nausea with hives 07/09/2017  . History of DVT (deep vein thrombosis) in pregnancy ([place]) 05/29/2017  . Sickle cell trait ([place]) 05/29/2017  . Acute asthma exacerbation 05/29/2017  . Chronic pancreatitis ([place])  05/29/2017  . Seizures ([place]) 05/29/2017  . Tobacco abuse 05/29/2017  . Acute on chronic respiratory failure with hypoxemia ([place]) 05/29/2017  . SOB (shortness of breath) 04/08/2017  . Cough 04/07/2017  . 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 Acute on chronic respiratory failure with hypoxia ([place]) 03/10/2017  . Colitis 11/12/2016  . Seizure ([place]) 11/12/2016  . Calculus of bile duct with acute cholecystitis with obstruction 10/14/2015  . Adjustment disorder with mixed anxiety and depressed mood 04/26/2015  . Low blood potassium 04/19/2015  . Asthma with acute exacerbation 04/19/2015  . Asthma 01/03/2015  . Asthma, chronic, unspecified asthma severity, with acute exacerbation 01/03/2015  . Influenza-like illness 01/03/2015  . Status post cesarean section 11/08/2014  . Previous cesarean section complicating pregnancy, antepartum condition or complication   . GERD (gastroesophageal reflux disease)   . Gastroesophageal reflux disease without esophagitis   . Abnormal biochemical finding on antenatal screening of mother   . Abnormal quad screen   . Echogenic focus of heart of fetus affecting antepartum care of mother   . Drug use affecting pregnancy, antepartum 05/19/2014  . Seizure disorder during pregnancy, antepartum ([place]) 05/13/2014  . Supervision of high risk pregnancy, antepartum 05/13/2014  . Asthma complicating pregnancy, antepartum 05/13/2014  . History of food anaphylaxis 05/13/2014  . Seizure disorder, sept 2015 last seizure 01/19/2013  . Anemia 01/19/2013    Past Surgical History:  Procedure Laterality  Date  . APPENDECTOMY    . CESAREAN SECTION  2013  . CESAREAN SECTION N/A 11/08/2014   Procedure: CESAREAN SECTION;  Surgeon: Truett Mainland, DO;  Location: [facility];  Service: Obstetrics;  Laterality: N/A;  . CHOLECYSTECTOMY N/A 10/16/2015   Procedure: LAPAROSCOPIC CHOLECYSTECTOMY WITH POSSIBLE INTRAOPERATIVE CHOLANGIOGRAM;  Surgeon: Donnie Mesa, MD;  Location: [facility];  Service:  General;  Laterality: N/A;  . CLOSED REDUCTION MANDIBLE WITH MANDIBULOMA N/A 07/17/2017   Procedure: REDUCTION TMJ WITH INTRAMAXILLARY FIXATION;  Surgeon: Diona Browner, DDS;  Location: [facility];  Service: Oral Surgery;  Laterality: N/A;  . ESOPHAGOGASTRODUODENOSCOPY (EGD) WITH PROPOFOL N/A 11/15/2016   Procedure: ESOPHAGOGASTRODUODENOSCOPY (EGD) WITH PROPOFOL;  Surgeon: Clarene Essex, MD;  Location: [facility];  Service: Endoscopy;  Laterality: N/A;  . INGUINAL HERNIA REPAIR Bilateral ~ 1996  . NERVE, TENDON AND ARTERY REPAIR Right 09/23/2012   Procedure: I&D and Repair As Necessary/Right Hand and Palm;  Surgeon: Roseanne Kaufman, MD;  Location: [facility];  Service: Orthopedics;  Laterality: Right;  . TONSILLECTOMY       OB History    Gravida  3   Para  3   Term  3   Preterm      AB      Living  3     SAB      TAB      Ectopic      Multiple  0   Live Births  3            Home Medications    Prior to Admission medications   Medication Sig Start Date End Date Taking? Authorizing Provider  albuterol (PROVENTIL HFA;VENTOLIN HFA) 108 (90 Base) MCG/ACT inhaler Inhale 2 puffs into the lungs every 4 (four) hours as needed for wheezing or shortness of breath. 08/20/17   Clent Demark, PA-C  Amylase-Lipase-Protease (CREON 20 PO) Take 2-7 capsules by mouth See admin instructions. Take 2-7 capsules by mouth with each meal and snack (dose based on amount of food intake)    [provider]  Ascorbic Acid (VITAMIN C) 100 MG CHEW Chew 1 tablet (100 mg total) by mouth daily. 08/20/17   Clent Demark, PA-C  cetirizine (ZYRTEC) 10 MG tablet Take 1 tablet (10 mg total) by mouth daily. 12/11/17   Tasia Catchings, Amy V, PA-C  docusate sodium (COLACE) 50 MG capsule Take 1 capsule (50 mg total) by mouth 2 (two) times daily as needed for mild constipation. 08/20/17   Clent Demark, PA-C  dornase alpha (PULMOZYME) 1 MG/ML nebulizer solution Take 2.5 mLs (2.5 mg total) by nebulization 2 (two) times  daily. 08/20/17   Clent Demark, PA-C  feeding supplement, ENSURE ENLIVE, (ENSURE ENLIVE) LIQD Take 237 mLs by mouth 2 (two) times daily between meals. 474 mL 3 times a day, provide 3-week supply 08/20/17   Clent Demark, PA-C  Fluticasone-Salmeterol (ADVAIR) 500-50 MCG/DOSE AEPB Inhale 1 puff into the lungs 2 (two) times daily. 08/20/17   Clent Demark, PA-C  HYDROcodone-acetaminophen (NORCO/VICODIN) 5-325 MG tablet Take 1 tablet by mouth every 6 (six) hours as needed for moderate pain (pain). 07/23/17   Thurnell Lose, MD  levETIRAcetam (KEPPRA) 750 MG tablet Take 1 tablet (750 mg total) by mouth 2 (two) times daily. 08/20/17   Clent Demark, PA-C  Lumacaftor-Ivacaftor 150-188 MG PACK Take 1 each by nebulization 2 (two) times daily. 08/20/17   Clent Demark, PA-C  methylPREDNISolone (MEDROL DOSEPAK) 4 MG TBPK tablet AS DIRECTED 02/27/18  Rancour, Annie Main, MD  montelukast (SINGULAIR) 10 MG tablet Take 10 mg by mouth at bedtime.    [provider]  Multiple Vitamin (MULTIVITAMIN WITH MINERALS) TABS tablet Take 2 tablets by mouth daily.    [provider]  nitrofurantoin, macrocrystal-monohydrate, (MACROBID) 100 MG capsule Take 1 capsule (100 mg total) by mouth 2 (two) times daily. 10/05/18   Jerrye Seebeck, [place] N, PA-C  SUMAtriptan (IMITREX) 50 MG tablet Take 1 tablet (50 mg total) by mouth daily. May take one more tablet two hours after the first. No more than two tablets per day. 01/06/18   Gildardo Pounds, NP  tobramycin, PF, (TOBI) 300 MG/5ML nebulizer solution Take 5 mLs (300 mg total) by nebulization every 12 (twelve) hours. 08/20/17   Clent Demark, PA-C  topiramate (TOPAMAX) 50 MG tablet Take 1 tablet (50 mg total) by mouth at bedtime. 01/06/18 02/27/18  Gildardo Pounds, NP    Family History Family History  Problem Relation Age of Onset  . Cancer Mother        cervical cancer  . Asthma Mother   . Hypertension Father   . Sickle cell anemia Father   .  Asthma Sister   . Diabetes Maternal Aunt   . Cancer Maternal Grandmother     Social History Social History   Tobacco Use  . Smoking status: Former Smoker    Packs/day: 0.25    Years: 4.00    Pack years: 1.00    Types: Cigarettes    Quit date: 10/02/2015    Years since quitting: 3.0  . Smokeless tobacco: Never Used  Substance Use Topics  . Alcohol use: No    Alcohol/week: 0.0 standard drinks    Frequency: Never  . Drug use: No     Allergies   Cheese, Chocolate, Ibuprofen, Ivp dye [iodinated diagnostic agents], Latex, Orange juice [orange oil], Other, Peach [prunus persica], Peanuts [peanut oil], Pear, Prednisone, Raspberry, Tylenol [acetaminophen], Amoxicillin, Apricot flavor, Doxycycline, Erythromycin, Milk of magnesia [magnesium hydroxide], and Penicillins   Review of Systems Review of Systems  Gastrointestinal: Positive for abdominal pain.  Genitourinary: Positive for vaginal bleeding. Negative for vaginal discharge.  All other systems reviewed and are negative.    Physical Exam Updated Vital Signs BP 115/78 (BP Location: Right Arm)   Pulse 86   Temp 99.2 F (37.3 C) (Oral)   Resp 17   Ht 5\' 4"  (1.626 m)   Wt 72.6 kg   LMP 09/15/2018   SpO2 100%   BMI 27.46 kg/m   Physical Exam Vitals signs and nursing note reviewed.  Constitutional:      General: She is not in acute distress.    Appearance: She is well-developed. She is not ill-appearing.  HENT:     Head: Normocephalic and atraumatic.  Eyes:     Conjunctiva/sclera: Conjunctivae normal.  Cardiovascular:     Rate and Rhythm: Normal rate and regular rhythm.  Pulmonary:     Effort: Pulmonary effort is normal. No respiratory distress.     Breath sounds: Normal breath sounds.  Abdominal:     General: Bowel sounds are normal.     Palpations: Abdomen is soft.     Tenderness: There is abdominal tenderness. There is no guarding or rebound.     Comments: Mild tenderness to lower abdomen bilaterally.   Genitourinary:    Comments: Patient declined pelvic exam Skin:    General: Skin is warm.  Neurological:     Mental Status: She is alert.  Psychiatric:  Behavior: Behavior normal.      ED Treatments / Results  Labs (all labs ordered are listed, but only abnormal results are displayed) Labs Reviewed  URINALYSIS, ROUTINE W REFLEX MICROSCOPIC - Abnormal; Notable for the following components:      Result Value   APPearance HAZY (*)    Specific Gravity, Urine >1.046 (*)    Hgb urine dipstick SMALL (*)    Ketones, ur 5 (*)    Protein, ur 30 (*)    Leukocytes,Ua TRACE (*)    Bacteria, UA RARE (*)    All other components within normal limits  URINE CULTURE  POC URINE PREG, ED    EKG None  Radiology Dg Chest 2 View  Result Date: 10/03/2018 CLINICAL DATA:  Chest pain EXAM: CHEST - 2 VIEW COMPARISON:  02/27/2018 FINDINGS: The heart size and mediastinal contours are within normal limits. Both lungs are clear. The visualized skeletal structures are unremarkable. IMPRESSION: No active cardiopulmonary disease. Electronically Signed   By: Kathreen Devoid   On: 10/03/2018 18:18    Procedures Procedures (including critical care time)  Medications Ordered in ED Medications - No data to display   Initial Impression / Assessment and Plan / ED Course  I have reviewed the triage vital signs and the nursing notes.  Pertinent labs & imaging results that were available during my care of the patient were reviewed by me and considered in my medical decision making (see chart for details).  Clinical Course as of Oct 05 1750  Sun Oct 05, 2018  1726 Pt changed her mind and declined pelvic exam. Will await urine results. VS are normal   [JR]    Clinical Course User Index [JR] Twisha Vanpelt, [place] N, PA-C       Patient presenting with 1 day of vaginal spotting that occurred 3 days ago, and some lower abdominal cramping similar to menstrual cramps.  She states the spotting was early for  her expected menstrual period.  She denies fever or other associated abdominal symptoms.  No vaginal discharge.  She does endorse increased urinary frequency. Pt was tachycardic and hypertensive in triage however after recheck with normal VS, suspect 2/t to anxiety. Abdomen with some mild bilateral lower abdominal tenderness, no guarding or rebound.  She is overall well-appearing and in no distress.  Offered pelvic exam, however patient declined stating she would rather follow-up with her provider outpatient.  Urine pregnancy is negative.  UA appears contaminated though with some mild signs of infection.  Will treat given patient's symptoms.  Culture sent.  Outpatient follow-up indicated.  Safe for discharge.  Discussed results, findings, treatment and follow up. Patient advised of return precautions. Patient verbalized understanding and agreed with plan.  Final Clinical Impressions(s) / ED Diagnoses   Final diagnoses:  Vaginal spotting  Acute lower UTI    ED Discharge Orders         Ordered    nitrofurantoin, macrocrystal-monohydrate, (MACROBID) 100 MG capsule  2 times daily     10/05/18 1751           Naomi Fitton, [place] N, [place] 10/05/18 1752    Elnora Morrison, MD 10/05/18 [phone]

## 2018-10-05 NOTE — Discharge Instructions (Signed)
Take antibiotic as prescribed until gone. Follow-up with your primary care provider/gynecologist regarding your visit today. Return the emergency department for severe abdominal pain, fever, new or concerning symptoms

## 2018-10-05 NOTE — ED Triage Notes (Addendum)
Pt arrives with lower abdominal "cramping" pain for about 3 days. She says the has also had some "intermittent pinkish/brownish vaginal spotting." LMP 09/15/18. Onset of nausea yesterday. Pt seen here for the same a couple days ago, says she had to leave prior to being seen.

## 2018-10-06 LAB — URINE CULTURE: Culture: NO GROWTH

## 2018-10-16 DIAGNOSIS — J45909 Unspecified asthma, uncomplicated: Secondary | ICD-10-CM | POA: Diagnosis not present

## 2018-10-16 DIAGNOSIS — R2689 Other abnormalities of gait and mobility: Secondary | ICD-10-CM | POA: Diagnosis not present

## 2018-10-16 DIAGNOSIS — J45901 Unspecified asthma with (acute) exacerbation: Secondary | ICD-10-CM | POA: Diagnosis not present

## 2018-10-16 DIAGNOSIS — G4739 Other sleep apnea: Secondary | ICD-10-CM | POA: Diagnosis not present

## 2018-11-16 DIAGNOSIS — R2689 Other abnormalities of gait and mobility: Secondary | ICD-10-CM | POA: Diagnosis not present

## 2018-11-16 DIAGNOSIS — J45909 Unspecified asthma, uncomplicated: Secondary | ICD-10-CM | POA: Diagnosis not present

## 2018-11-16 DIAGNOSIS — G4739 Other sleep apnea: Secondary | ICD-10-CM | POA: Diagnosis not present

## 2018-11-16 DIAGNOSIS — J45901 Unspecified asthma with (acute) exacerbation: Secondary | ICD-10-CM | POA: Diagnosis not present

## 2018-12-04 ENCOUNTER — Emergency Department (HOSPITAL_COMMUNITY)
Admission: EM | Admit: 2018-12-04 | Discharge: 2018-12-05 | Disposition: A | Discharge status: Home / Self Care | Payer: Medicaid Other | Attending: Emergency Medicine | Admitting: Emergency Medicine

## 2018-12-04 ENCOUNTER — Other Ambulatory Visit: Payer: Self-pay

## 2018-12-04 ENCOUNTER — Encounter (HOSPITAL_COMMUNITY): Payer: Self-pay | Admitting: Emergency Medicine

## 2018-12-04 ENCOUNTER — Emergency Department (HOSPITAL_COMMUNITY): Payer: Medicaid Other

## 2018-12-04 DIAGNOSIS — Z86718 Personal history of other venous thrombosis and embolism: Secondary | ICD-10-CM | POA: Insufficient documentation

## 2018-12-04 DIAGNOSIS — J45909 Unspecified asthma, uncomplicated: Secondary | ICD-10-CM | POA: Insufficient documentation

## 2018-12-04 DIAGNOSIS — Z91040 Latex allergy status: Secondary | ICD-10-CM | POA: Diagnosis not present

## 2018-12-04 DIAGNOSIS — R1031 Right lower quadrant pain: Secondary | ICD-10-CM | POA: Diagnosis not present

## 2018-12-04 DIAGNOSIS — R109 Unspecified abdominal pain: Secondary | ICD-10-CM | POA: Diagnosis present

## 2018-12-04 DIAGNOSIS — N839 Noninflammatory disorder of ovary, fallopian tube and broad ligament, unspecified: Secondary | ICD-10-CM | POA: Diagnosis not present

## 2018-12-04 DIAGNOSIS — Z87891 Personal history of nicotine dependence: Secondary | ICD-10-CM | POA: Insufficient documentation

## 2018-12-04 DIAGNOSIS — N838 Other noninflammatory disorders of ovary, fallopian tube and broad ligament: Secondary | ICD-10-CM | POA: Insufficient documentation

## 2018-12-04 LAB — CBC
HCT: 33 % — ABNORMAL LOW (ref 36.0–46.0)
Hemoglobin: 10.2 g/dL — ABNORMAL LOW (ref 12.0–15.0)
MCH: 24.8 pg — ABNORMAL LOW (ref 26.0–34.0)
MCHC: 30.9 g/dL (ref 30.0–36.0)
MCV: 80.3 fL (ref 80.0–100.0)
Platelets: 246 10*3/uL (ref 150–400)
RBC: 4.11 MIL/uL (ref 3.87–5.11)
RDW: 16.2 % — ABNORMAL HIGH (ref 11.5–15.5)
WBC: 9.5 10*3/uL (ref 4.0–10.5)
nRBC: 0 % (ref 0.0–0.2)

## 2018-12-04 LAB — URINALYSIS, ROUTINE W REFLEX MICROSCOPIC
Bacteria, UA: NONE SEEN
Bilirubin Urine: NEGATIVE
Glucose, UA: NEGATIVE mg/dL
Ketones, ur: NEGATIVE mg/dL
Leukocytes,Ua: NEGATIVE
Nitrite: NEGATIVE
Protein, ur: NEGATIVE mg/dL
Specific Gravity, Urine: 1.015 (ref 1.005–1.030)
pH: 6 (ref 5.0–8.0)

## 2018-12-04 LAB — I-STAT BETA HCG BLOOD, ED (MC, WL, AP ONLY): I-stat hCG, quantitative: <5 m[IU]/mL (ref <5)

## 2018-12-04 LAB — COMPREHENSIVE METABOLIC PANEL
ALT: 9 U/L (ref 0–44)
AST: 16 U/L (ref 15–41)
Albumin: 3.5 g/dL (ref 3.5–5.0)
Alkaline Phosphatase: 47 U/L (ref 38–126)
Anion gap: 9 (ref 5–15)
BUN: 8 mg/dL (ref 6–20)
CO2: 25 mmol/L (ref 22–32)
Calcium: 8.8 mg/dL — ABNORMAL LOW (ref 8.9–10.3)
Chloride: 103 mmol/L (ref 98–111)
Creatinine, Ser: 0.85 mg/dL (ref 0.44–1.00)
GFR calc Af Amer: >60 mL/min (ref >60)
GFR calc non Af Amer: >60 mL/min (ref >60)
Glucose, Bld: 98 mg/dL (ref 70–99)
Potassium: 3 mmol/L — ABNORMAL LOW (ref 3.5–5.1)
Sodium: 137 mmol/L (ref 135–145)
Total Bilirubin: 0.4 mg/dL (ref 0.3–1.2)
Total Protein: 6.6 g/dL (ref 6.5–8.1)

## 2018-12-04 LAB — LIPASE, BLOOD: Lipase: 32 U/L (ref 11–51)

## 2018-12-04 NOTE — ED Notes (Signed)
Pt ambulated to CT

## 2018-12-04 NOTE — Discharge Instructions (Signed)
You were seen in the emergency department today with abdominal pain and distention.  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  Please call your primary gynecologist to schedule the next available appointment for further work-up.  If you are having trouble getting an appointment you can call the office of Dr. Denman George and they can help you get an appointment sooner with your primary gynecologist.  Return to the emergency department any new or suddenly worsening symptoms.

## 2018-12-04 NOTE — ED Triage Notes (Signed)
Patient c/o RLQ pain x4 days. Reports distended abdomen. Denies N/V/D.

## 2018-12-04 NOTE — ED Notes (Signed)
Pelvic exam set up and ready at bedside

## 2018-12-04 NOTE — ED Notes (Signed)
Pt back from CT

## 2018-12-04 NOTE — ED Provider Notes (Signed)
Emergency Department Provider Note   I have reviewed the triage vital signs and the nursing notes.   HISTORY  Chief Complaint Abdominal Pain   HPI Jasmine [name] is a 28 y.o. female with PMH reviewed below presents to the emergency department for evaluation of lower abdominal pain with return of vaginal bleeding.  Symptoms been ongoing for the past week.  Patient states that 3 months she has had her normal menstrual cycle which lasts for approximately 7 days and then 1 to 2 weeks later will have an additional episode of bleeding for several days which is heavy with clots.  She has no prior history of irregular menses in the past.  In the last week she developed lower abdominal pain worse in the right and then the second episode of bleeding occurred 2 days ago and has continued into today.  She does feel somewhat lightheaded but denies shortness of breath or chest pain.  No fevers or chills.  She does not OCPs.    Past Medical History:  Diagnosis Date  . Asthma   . Complication of anesthesia    "I wake up during anesthesia" (10/14/2015)  . 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  Marland Kitchen DVT (deep vein thrombosis) in pregnancy   . Epilepsy ([place])   . GERD (gastroesophageal reflux disease)   . Heart murmur    last work up age 28- no symtoms  . On home oxygen therapy    "2L when I sleep" (07/18/2017)  . Pancreatitis   . Sickle cell trait ([place])   . Sleep apnea    "used to wear CPAP; don't have one here in [place] since I moved in 2014" (10/14/2015)    Patient Active Problem List   Diagnosis Date Noted  . Asthma exacerbation 12/01/2017  . Acute hypercapnic respiratory failure ([place]) 12/01/2017  . Exocrine pancreatic insufficiency 12/01/2017  . Facial contusion 07/17/2017  . Closed dislocation of right jaw 07/17/2017  . Nausea with hives 07/09/2017  . History of DVT (deep vein thrombosis) in pregnancy ([place]) 05/29/2017  . Sickle cell trait ([place]) 05/29/2017  . Acute  asthma exacerbation 05/29/2017  . Chronic pancreatitis ([place]) 05/29/2017  . Seizures ([place]) 05/29/2017  . Tobacco abuse 05/29/2017  . Acute on chronic respiratory failure with hypoxemia ([place]) 05/29/2017  . SOB (shortness of breath) 04/08/2017  . Cough 04/07/2017  . 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 Acute on chronic respiratory failure with hypoxia ([place]) 03/10/2017  . Colitis 11/12/2016  . Seizure ([place]) 11/12/2016  . Calculus of bile duct with acute cholecystitis with obstruction 10/14/2015  . Adjustment disorder with mixed anxiety and depressed mood 04/26/2015  . Low blood potassium 04/19/2015  . Asthma with acute exacerbation 04/19/2015  . Asthma 01/03/2015  . Asthma, chronic, unspecified asthma severity, with acute exacerbation 01/03/2015  . Influenza-like illness 01/03/2015  . Status post cesarean section 11/08/2014  . Previous cesarean section complicating pregnancy, antepartum condition or complication   . GERD (gastroesophageal reflux disease)   . Gastroesophageal reflux disease without esophagitis   . Abnormal biochemical finding on antenatal screening of mother   . Abnormal quad screen   . Echogenic focus of heart of fetus affecting antepartum care of mother   . Drug use affecting pregnancy, antepartum 05/19/2014  . Seizure disorder during pregnancy, antepartum ([place]) 05/13/2014  . Supervision of high risk pregnancy, antepartum 05/13/2014  . Asthma complicating pregnancy, antepartum 05/13/2014  . History of food anaphylaxis 05/13/2014  . Seizure disorder,  sept 2015 last seizure 01/19/2013  . Anemia 01/19/2013    Past Surgical History:  Procedure Laterality Date  . APPENDECTOMY    . CESAREAN SECTION  2013  . CESAREAN SECTION N/A 11/08/2014   Procedure: CESAREAN SECTION;  Surgeon: Truett Mainland, DO;  Location: [facility];  Service: Obstetrics;  Laterality: N/A;  . CHOLECYSTECTOMY N/A 10/16/2015   Procedure: LAPAROSCOPIC CHOLECYSTECTOMY WITH POSSIBLE INTRAOPERATIVE  CHOLANGIOGRAM;  Surgeon: Donnie Mesa, MD;  Location: [facility];  Service: General;  Laterality: N/A;  . CLOSED REDUCTION MANDIBLE WITH MANDIBULOMA N/A 07/17/2017   Procedure: REDUCTION TMJ WITH INTRAMAXILLARY FIXATION;  Surgeon: Diona Browner, DDS;  Location: [facility];  Service: Oral Surgery;  Laterality: N/A;  . ESOPHAGOGASTRODUODENOSCOPY (EGD) WITH PROPOFOL N/A 11/15/2016   Procedure: ESOPHAGOGASTRODUODENOSCOPY (EGD) WITH PROPOFOL;  Surgeon: Clarene Essex, MD;  [facility];  Service: Endoscopy;  Laterality: N/A;  . INGUINAL HERNIA REPAIR Bilateral ~ 1996  . NERVE, TENDON AND ARTERY REPAIR Right 09/23/2012   Procedure: I&D and Repair As Necessary/Right Hand and Palm;  Surgeon: Roseanne Kaufman, MD;  Location: [facility];  Service: Orthopedics;  Laterality: Right;  . TONSILLECTOMY      Allergies Cheese, Chocolate, Ibuprofen, Ivp dye [iodinated diagnostic agents], Latex, Orange juice [orange oil], Other, Peach [prunus persica], Peanuts [peanut oil], Pear, Prednisone, Raspberry, Tylenol [acetaminophen], Amoxicillin, Apricot flavor, Doxycycline, Erythromycin, Milk of magnesia [magnesium hydroxide], and Penicillins  Family History  Problem Relation Age of Onset  . Cancer Mother        cervical cancer  . Asthma Mother   . Hypertension Father   . Sickle cell anemia Father   . Asthma Sister   . Diabetes Maternal Aunt   . Cancer Maternal Grandmother     Social History Social History   Tobacco Use  . Smoking status: Former Smoker    Packs/day: 0.25    Years: 4.00    Pack years: 1.00    Types: Cigarettes    Quit date: 10/02/2015    Years since quitting: 3.1  . Smokeless tobacco: Never Used  Substance Use Topics  . Alcohol use: No    Alcohol/week: 0.0 standard drinks    Frequency: Never  . Drug use: No    Review of Systems  Constitutional: No fever/chills Eyes: No visual changes. ENT: No sore throat. Cardiovascular: Denies chest pain. Respiratory: Denies shortness of breath.  Gastrointestinal: Positive lower abdominal pain.  No nausea, no vomiting.  No diarrhea.  No constipation. Genitourinary: Negative for dysuria. Positive vaginal bleeding.  Musculoskeletal: Negative for back pain. Skin: Negative for rash. Neurological: Negative for headaches, focal weakness or numbness.  10-point ROS otherwise negative.  ____________________________________________   PHYSICAL EXAM:  VITAL SIGNS: ED Triage Vitals  Enc Vitals Group     BP 12/04/18 1920 129/87     Pulse Rate 12/04/18 1920 92     Resp 12/04/18 1920 20     Temp 12/04/18 1920 98.4 F (36.9 C)     Temp src --      SpO2 12/04/18 1920 100 %   Constitutional: Alert and oriented. Well appearing and in no acute distress. Eyes: Conjunctivae are normal. PERRL. EOMI. Head: Atraumatic. Nose: No congestion/rhinnorhea. Mouth/Throat: Mucous membranes are moist.  Oropharynx non-erythematous. Neck: No stridor.   Cardiovascular: Normal rate, regular rhythm. Good peripheral circulation. Grossly normal heart sounds.   Respiratory: Normal respiratory effort.  No retractions. Lungs CTAB. Gastrointestinal: Soft with mild/moderate right abdominal tenderness. No rebound or guarding. No distention.  Musculoskeletal: No gross deformities of  extremities. Neurologic:  Normal speech and language. No gross focal neurologic deficits are appreciated.  Skin:  Skin is warm, dry and intact. No rash noted.   ____________________________________________   LABS (all labs ordered are listed, but only abnormal results are displayed)  Labs Reviewed  COMPREHENSIVE METABOLIC PANEL - Abnormal; Notable for the following components:      Result Value   Potassium 3.0 (*)    Calcium 8.8 (*)    All other components within normal limits  CBC - Abnormal; Notable for the following components:   Hemoglobin 10.2 (*)    HCT 33.0 (*)    MCH 24.8 (*)    RDW 16.2 (*)    All other components within normal limits  URINALYSIS, ROUTINE W REFLEX  MICROSCOPIC - Abnormal; Notable for the following components:   Color, Urine STRAW (*)    Hgb urine dipstick MODERATE (*)    All other components within normal limits  WET PREP, GENITAL  LIPASE, BLOOD  I-STAT BETA HCG BLOOD, ED ([facility], [facility], [facility] ONLY)  GC/CHLAMYDIA PROBE AMP ([place]) NOT AT [facility]   ____________________________________________  RADIOLOGY  Ct Abdomen Pelvis Wo Contrast  Result Date: 12/04/2018 CLINICAL DATA:  Right lower quadrant pain, distention EXAM: CT ABDOMEN AND PELVIS WITHOUT CONTRAST TECHNIQUE: Multidetector CT imaging of the abdomen and pelvis was performed following the standard protocol without IV contrast. COMPARISON:  07/09/2017 FINDINGS: Lower chest: Areas of bibasilar subsegmental atelectasis. No effusions. Heart is normal size. Hepatobiliary: No focal liver abnormality is seen. Status post cholecystectomy. No biliary dilatation. Pancreas: No focal abnormality or ductal dilatation. Spleen: No focal abnormality.  Normal size. Adrenals/Urinary Tract: No adrenal abnormality. No focal renal abnormality. No stones or hydronephrosis. Urinary bladder is unremarkable. Stomach/Bowel: Stomach, large and small bowel grossly unremarkable. Vascular/Lymphatic: No evidence of aneurysm or adenopathy. Reproductive: Very large cystic mass extends from the pelvis into the upper abdomen. There are bilateral adnexal cystic masses on prior CT, but this is new and much larger. This mass measures 30 x 22 x 14 cm. There are soft tissue components with nodularity and septations in the left anterior portion of this large mass. Additional cystic mass noted in the pelvis/left adnexa measuring up to 5.3 cm. Uterus unremarkable. Other: Trace free fluid in the pelvis.  No free air. Musculoskeletal: No acute bony abnormality. IMPRESSION: 30 cm complex cystic mass arising from the pelvis and extending into the upper abdomen. This is either new or represents marked enlargement of 1 of the previously seen  adnexal cystic masses on prior CT. This is most compatible with cystic ovarian neoplasm. Recommend gynecologic consultation. Additional 5.3 cm cystic mass in the left adnexa. These results were called by telephone at the time of interpretation on 12/04/2018 at 10:43 pm to provider Ari Bernabei , who verbally acknowledged these results. Electronically Signed   By: Rolm Baptise M.D.   On: 12/04/2018 22:48    ____________________________________________   PROCEDURES  Procedure(s) performed:   Procedures  None ____________________________________________   INITIAL IMPRESSION / ASSESSMENT AND PLAN / ED COURSE  Pertinent labs & imaging results that were available during my care of the patient were reviewed by me and considered in my medical decision making (see chart for details).   Patient with irregular menstrual cycles, abdominal pain, and abdominal distension. Mild tenderness on exam. CT w/o contrast obtained with patient's listed allergy to contrast dye but describes only SOB with PO contrast. Labs with mild hypokalemia and mild edema.   Radiology called to discuss  the CT findings. 30 cm mass arising from the pelvis concerning for ovarian neoplasm. No concern clinically for torsion and ovary is not visualized on CT due to size of cyst.   Spoke with Dr. Denman George with Gyn onc and discussed the case and CT. She would like the patient to f/u with her primary Gyn at [facility] and they can refer from there is cancer is suspected/confirmed. She offered to assist the patient with getting a primary Gyn appointment if she is having trouble. Provided number for both at discharge.   Discussed findings with patient and that while the CT findings are concerning, cancer is NOT confirmed by CT alone and that she needs ASAP f/u with Gyn. Patient to call for urgent appointment in the AM. Discussed ED return precautions. Pelvic exam deferred in the setting of CT findings explaining the patient's symptoms.   ____________________________________________  FINAL CLINICAL IMPRESSION(S) / ED DIAGNOSES  Final diagnoses:  Ovarian mass, right    Note:  This document was prepared using Dragon voice recognition software and may include unintentional dictation errors.  Nanda Quinton, MD, [facility] Emergency Medicine    Skyelar Swigart, [facility], MD 12/05/18 [phone]

## 2018-12-08 ENCOUNTER — Ambulatory Visit: Payer: Medicaid Other | Admitting: Obstetrics & Gynecology

## 2018-12-08 ENCOUNTER — Other Ambulatory Visit (INDEPENDENT_AMBULATORY_CARE_PROVIDER_SITE_OTHER): Payer: Self-pay | Admitting: Nurse Practitioner

## 2018-12-08 DIAGNOSIS — R519 Headache, unspecified: Secondary | ICD-10-CM

## 2018-12-09 ENCOUNTER — Encounter: Payer: Self-pay | Admitting: Obstetrics and Gynecology

## 2018-12-09 ENCOUNTER — Ambulatory Visit (INDEPENDENT_AMBULATORY_CARE_PROVIDER_SITE_OTHER): Payer: Medicaid Other | Admitting: Obstetrics and Gynecology

## 2018-12-09 ENCOUNTER — Other Ambulatory Visit: Payer: Self-pay

## 2018-12-09 ENCOUNTER — Other Ambulatory Visit (HOSPITAL_COMMUNITY)
Admission: RE | Admit: 2018-12-09 | Discharge: 2018-12-09 | Disposition: A | Discharge status: Home / Self Care | Payer: Medicaid Other | Source: Ambulatory Visit | Attending: Obstetrics and Gynecology | Admitting: Obstetrics and Gynecology

## 2018-12-09 VITALS — BP 120/77 | HR 98 | Wt 168.6 lb

## 2018-12-09 DIAGNOSIS — R1900 Intra-abdominal and pelvic swelling, mass and lump, unspecified site: Secondary | ICD-10-CM

## 2018-12-09 NOTE — Progress Notes (Signed)
Pt is here for a follow up from the ED on 12/04/2018.

## 2018-12-09 NOTE — Progress Notes (Signed)
28 yo P3 here as an ED follow up due to a pelvic mass. Patient reports onset of lower abdominal pelvic pain a week ago. She states that her clothes feel tighter and has been causing her to have right lower quadrant pain. Patient also describes increase in abdominal girth over the past 3 months. She is sexually active without contraception with her husband. They are hoping to have one more child. She is without any other complaints. She reports a monthly period lasting 7 days. She denies nausea/emesis or constipation  Past Medical History:  Diagnosis Date  . Asthma   . Complication of anesthesia    "I wake up during anesthesia" (10/14/2015)  . 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  Marland Kitchen DVT (deep vein thrombosis) in pregnancy   . Epilepsy ([place])   . GERD (gastroesophageal reflux disease)   . Heart murmur    last work up age 28- no symtoms  . On home oxygen therapy    "2L when I sleep" (07/18/2017)  . Pancreatitis   . Sickle cell trait ([place])   . Sleep apnea    "used to wear CPAP; don't have one here in [employer] since I moved in 2014" (10/14/2015)   Past Surgical History:  Procedure Laterality Date  . APPENDECTOMY    . CESAREAN SECTION  2013  . CESAREAN SECTION N/A 11/08/2014   Procedure: CESAREAN SECTION;  Surgeon: Truett Mainland, DO;  Location: [facility];  Service: Obstetrics;  Laterality: N/A;  . CHOLECYSTECTOMY N/A 10/16/2015   Procedure: LAPAROSCOPIC CHOLECYSTECTOMY WITH POSSIBLE INTRAOPERATIVE CHOLANGIOGRAM;  Surgeon: Donnie Mesa, MD;  Location: [facility];  Service: General;  Laterality: N/A;  . CLOSED REDUCTION MANDIBLE WITH MANDIBULOMA N/A 07/17/2017   Procedure: REDUCTION TMJ WITH INTRAMAXILLARY FIXATION;  Surgeon: Diona Browner, DDS;  Location: [facility];  Service: Oral Surgery;  Laterality: N/A;  . ESOPHAGOGASTRODUODENOSCOPY (EGD) WITH PROPOFOL N/A 11/15/2016   Procedure: ESOPHAGOGASTRODUODENOSCOPY (EGD) WITH PROPOFOL;  Surgeon: Clarene Essex, MD;  [facility];   Service: Endoscopy;  Laterality: N/A;  . INGUINAL HERNIA REPAIR Bilateral ~ 1996  . NERVE, TENDON AND ARTERY REPAIR Right 09/23/2012   Procedure: I&D and Repair As Necessary/Right Hand and Palm;  Surgeon: Roseanne Kaufman, MD;  Location: [facility];  Service: Orthopedics;  Laterality: Right;  . TONSILLECTOMY     Family History  Problem Relation Age of Onset  . Cancer Mother        cervical cancer  . Asthma Mother   . Hypertension Father   . Sickle cell anemia Father   . Asthma Sister   . Diabetes Maternal Aunt   . Cancer Maternal Grandmother    Social History   Tobacco Use  . Smoking status: Former Smoker    Packs/day: 0.25    Years: 4.00    Pack years: 1.00    Types: Cigarettes    Quit date: 10/02/2015    Years since quitting: 3.1  . Smokeless tobacco: Never Used  Substance Use Topics  . Alcohol use: No    Alcohol/week: 0.0 standard drinks    Frequency: Never  . Drug use: No   ROS See pertinent in HPI Blood pressure 120/77, pulse 98, weight 168 lb 9.6 oz (76.5 kg), last menstrual period 12/04/2018.  GENERAL: Well-developed, well-nourished female in no acute distress.  ABDOMEN: Soft, nontender, distended PELVIC: Normal external female genitalia. Vagina is pink and rugated.  Normal discharge. Normal appearing cervix. Uterus could not be appreciated. Fullness on cul de  sac EXTREMITIES: No cyanosis, clubbing, or edema, 2+ distal pulses.  Ct Abdomen Pelvis Wo Contrast  Result Date: 12/04/2018 CLINICAL DATA:  Right lower quadrant pain, distention EXAM: CT ABDOMEN AND PELVIS WITHOUT CONTRAST TECHNIQUE: Multidetector CT imaging of the abdomen and pelvis was performed following the standard protocol without IV contrast. COMPARISON:  07/09/2017 FINDINGS: Lower chest: Areas of bibasilar subsegmental atelectasis. No effusions. Heart is normal size. Hepatobiliary: No focal liver abnormality is seen. Status post cholecystectomy. No biliary dilatation. Pancreas: No focal abnormality or ductal  dilatation. Spleen: No focal abnormality.  Normal size. Adrenals/Urinary Tract: No adrenal abnormality. No focal renal abnormality. No stones or hydronephrosis. Urinary bladder is unremarkable. Stomach/Bowel: Stomach, large and small bowel grossly unremarkable. Vascular/Lymphatic: No evidence of aneurysm or adenopathy. Reproductive: Very large cystic mass extends from the pelvis into the upper abdomen. There are bilateral adnexal cystic masses on prior CT, but this is new and much larger. This mass measures 30 x 22 x 14 cm. There are soft tissue components with nodularity and septations in the left anterior portion of this large mass. Additional cystic mass noted in the pelvis/left adnexa measuring up to 5.3 cm. Uterus unremarkable. Other: Trace free fluid in the pelvis.  No free air. Musculoskeletal: No acute bony abnormality. IMPRESSION: 30 cm complex cystic mass arising from the pelvis and extending into the upper abdomen. This is either new or represents marked enlargement of 1 of the previously seen adnexal cystic masses on prior CT. This is most compatible with cystic ovarian neoplasm. Recommend gynecologic consultation. Additional 5.3 cm cystic mass in the left adnexa. These results were called by telephone at the time of interpretation on 12/04/2018 at 10:43 pm to provider JOSHUA LONG , who verbally acknowledged these results. Electronically Signed   By: Rolm Baptise M.D.   On: 12/04/2018 22:48     A/P 28 yo with enlarged adnexal mass - pap smear collected - CA 125 ordered - Discussed CT finding with patient. Large mass and history of rapid increase in abdominal girth over 3 months raises concern for neoplasm. Patient referred to GYN ONC for surgical consultation - Patient will be notified of any abnormal results

## 2018-12-10 ENCOUNTER — Telehealth: Payer: Self-pay | Admitting: *Deleted

## 2018-12-10 LAB — CA 125: Cancer Antigen (CA) 125: 94.4 U/mL — ABNORMAL HIGH (ref 0.0–38.1)

## 2018-12-10 NOTE — Telephone Encounter (Signed)
Called the patient and scheduled an appointment for 12/7 at 11:30 am . Explained to arrive at [id number] and gave policy for mask and no visitors

## 2018-12-12 LAB — CYTOLOGY - PAP: Diagnosis: NEGATIVE

## 2018-12-16 DIAGNOSIS — J45901 Unspecified asthma with (acute) exacerbation: Secondary | ICD-10-CM | POA: Diagnosis not present

## 2018-12-16 DIAGNOSIS — R2689 Other abnormalities of gait and mobility: Secondary | ICD-10-CM | POA: Diagnosis not present

## 2018-12-16 DIAGNOSIS — G4739 Other sleep apnea: Secondary | ICD-10-CM | POA: Diagnosis not present

## 2018-12-16 DIAGNOSIS — J45909 Unspecified asthma, uncomplicated: Secondary | ICD-10-CM | POA: Diagnosis not present

## 2018-12-22 ENCOUNTER — Inpatient Hospital Stay (HOSPITAL_BASED_OUTPATIENT_CLINIC_OR_DEPARTMENT_OTHER): Payer: Medicaid Other | Admitting: Gynecologic Oncology

## 2018-12-22 ENCOUNTER — Inpatient Hospital Stay (HOSPITAL_BASED_OUTPATIENT_CLINIC_OR_DEPARTMENT_OTHER): Payer: Medicaid Other | Admitting: Medical

## 2018-12-22 ENCOUNTER — Encounter: Payer: Self-pay | Admitting: Gynecologic Oncology

## 2018-12-22 ENCOUNTER — Other Ambulatory Visit: Payer: Self-pay | Admitting: Medical

## 2018-12-22 ENCOUNTER — Other Ambulatory Visit: Payer: Self-pay

## 2018-12-22 ENCOUNTER — Inpatient Hospital Stay: Payer: Medicaid Other | Attending: Gynecologic Oncology

## 2018-12-22 ENCOUNTER — Inpatient Hospital Stay: Payer: Medicaid Other

## 2018-12-22 ENCOUNTER — Other Ambulatory Visit: Payer: Self-pay | Admitting: Gynecologic Oncology

## 2018-12-22 ENCOUNTER — Other Ambulatory Visit (HOSPITAL_COMMUNITY): Payer: Medicaid Other

## 2018-12-22 VITALS — BP 133/88 | HR 106 | Temp 98.5°F | Resp 18 | Ht 66.0 in | Wt 165.0 lb

## 2018-12-22 DIAGNOSIS — R1900 Intra-abdominal and pelvic swelling, mass and lump, unspecified site: Secondary | ICD-10-CM | POA: Insufficient documentation

## 2018-12-22 DIAGNOSIS — R6881 Early satiety: Secondary | ICD-10-CM | POA: Diagnosis not present

## 2018-12-22 DIAGNOSIS — R197 Diarrhea, unspecified: Secondary | ICD-10-CM | POA: Diagnosis not present

## 2018-12-22 DIAGNOSIS — E860 Dehydration: Secondary | ICD-10-CM | POA: Diagnosis not present

## 2018-12-22 DIAGNOSIS — Z87440 Personal history of urinary (tract) infections: Secondary | ICD-10-CM | POA: Insufficient documentation

## 2018-12-22 DIAGNOSIS — N939 Abnormal uterine and vaginal bleeding, unspecified: Secondary | ICD-10-CM | POA: Insufficient documentation

## 2018-12-22 DIAGNOSIS — R102 Pelvic and perineal pain: Secondary | ICD-10-CM | POA: Diagnosis not present

## 2018-12-22 DIAGNOSIS — D398 Neoplasm of uncertain behavior of other specified female genital organs: Secondary | ICD-10-CM | POA: Diagnosis not present

## 2018-12-22 DIAGNOSIS — Z20828 Contact with and (suspected) exposure to other viral communicable diseases: Secondary | ICD-10-CM | POA: Insufficient documentation

## 2018-12-22 DIAGNOSIS — R140 Abdominal distension (gaseous): Secondary | ICD-10-CM | POA: Insufficient documentation

## 2018-12-22 DIAGNOSIS — R110 Nausea: Secondary | ICD-10-CM | POA: Insufficient documentation

## 2018-12-22 LAB — CBC WITH DIFFERENTIAL (CANCER CENTER ONLY)
Abs Immature Granulocytes: 0.05 10*3/uL (ref 0.00–0.07)
Basophils Absolute: 0 10*3/uL (ref 0.0–0.1)
Basophils Relative: 0 %
Eosinophils Absolute: 0.1 10*3/uL (ref 0.0–0.5)
Eosinophils Relative: 1 %
HCT: 32.2 % — ABNORMAL LOW (ref 36.0–46.0)
Hemoglobin: 9.9 g/dL — ABNORMAL LOW (ref 12.0–15.0)
Immature Granulocytes: 0 %
Lymphocytes Relative: 19 %
Lymphs Abs: 2.5 10*3/uL (ref 0.7–4.0)
MCH: 23.7 pg — ABNORMAL LOW (ref 26.0–34.0)
MCHC: 30.7 g/dL (ref 30.0–36.0)
MCV: 77 fL — ABNORMAL LOW (ref 80.0–100.0)
Monocytes Absolute: 0.6 10*3/uL (ref 0.1–1.0)
Monocytes Relative: 4 %
Neutro Abs: 9.9 10*3/uL — ABNORMAL HIGH (ref 1.7–7.7)
Neutrophils Relative %: 76 %
Platelet Count: 257 10*3/uL (ref 150–400)
RBC: 4.18 MIL/uL (ref 3.87–5.11)
RDW: 16.1 % — ABNORMAL HIGH (ref 11.5–15.5)
WBC Count: 13.2 10*3/uL — ABNORMAL HIGH (ref 4.0–10.5)
nRBC: 0 % (ref 0.0–0.2)

## 2018-12-22 LAB — BASIC METABOLIC PANEL
Anion gap: 10 (ref 5–15)
BUN: <4 mg/dL — ABNORMAL LOW (ref 6–20)
CO2: 23 mmol/L (ref 22–32)
Calcium: 8.5 mg/dL — ABNORMAL LOW (ref 8.9–10.3)
Chloride: 106 mmol/L (ref 98–111)
Creatinine, Ser: 0.76 mg/dL (ref 0.44–1.00)
GFR calc Af Amer: >60 mL/min (ref >60)
GFR calc non Af Amer: >60 mL/min (ref >60)
Glucose, Bld: 76 mg/dL (ref 70–99)
Potassium: 3.2 mmol/L — ABNORMAL LOW (ref 3.5–5.1)
Sodium: 139 mmol/L (ref 135–145)

## 2018-12-22 LAB — ALBUMIN: Albumin: 3.7 g/dL (ref 3.5–5.0)

## 2018-12-22 LAB — PREGNANCY, URINE: Preg Test, Ur: NEGATIVE

## 2018-12-22 LAB — SARS CORONAVIRUS 2 (TAT 6-24 HRS): SARS Coronavirus 2: NEGATIVE

## 2018-12-22 LAB — LACTATE DEHYDROGENASE: LDH: 116 U/L (ref 98–192)

## 2018-12-22 MED ORDER — SODIUM CHLORIDE 0.9 % IV SOLN
INTRAVENOUS | Status: DC
  Administered 2018-12-22 (×2): via INTRAVENOUS
  Filled 2018-12-22 (×2): qty 250

## 2018-12-22 NOTE — Progress Notes (Signed)
GYNECOLOGIC ONCOLOGY NEW PATIENT CONSULTATION   Patient Name: Jasmine [name]  Patient Age: 28 y.o. Date of Service: 12/22/18 Referring Provider: Clent Demark, PA-C No address on file   Primary Care Provider: Clent Demark, PA-C Consulting Provider: Jeral Pinch, MD   Assessment/Plan:  28 year old with large abdominal pelvic mass likely of ovarian origin.  We reviewed imaging findings of a large pelvic mass likely emanating from one of her ovaries.  On review of imaging from last year, the patient had bilateral cystic masses at that time.  While the 30 cm mass is mostly cystic, there are some soft tissue nodularities and septations.  This complexity raises the concern for possible malignancy.  Patient had a CA-125 recently that was mildly elevated at just under 100.  Given her age, we discussed getting other tumor markers for germ cell and sex cord stromal tumors today.  The plan will be for an exploratory laparotomy with unilateral salpingo-oophorectomy.  We will start with a mini lap and if it is possible after palpation of the abdomen to decompress the cyst in a controlled manner, then we will proceed with a smaller incision.  If concern for malignancy is great enough and controlled decompression does not seem possible, then the incision will be extended.  We will plan to send the mass for frozen section at the time of surgery.  If no malignancy is found, then I will plan to inspect the smaller cystic mass on the other ovary only.  If malignancy is encountered, then depending on the histology, the patient will likely need staging which at a minimum would include omentectomy and lymph node sampling.  Given her age and desire to maintain fertility, I will leave her uterus and other fallopian tube and ovary in situ if possible.  The patient voices understanding of this plan.  Additionally, given her history of irregular bleeding, we discussed an endometrial biopsy versus D&C in the OR  tomorrow.  Given her significant symptoms and evident dehydration, we will try to admit the patient today for add-on surgery tomorrow.  If this is not possible due to bed availability, then we will plan to hydrate the patient in the infusion center and get labs today.  The patient will also require Covid testing as well as medical clearance given her pulmonary comorbidities and seizure history.  Plan for surgery: exlap, USO, possible staging including omentectomy, lymph node biopsies, possible contralateral USO and total hysterectomy  A copy of this note was sent to the patient's referring provider.   Jeral Pinch, MD  Division of Gynecologic Oncology  Department of Obstetrics and Gynecology  [facility]  ___________________________________________  Chief Complaint: Chief Complaint  Patient presents with  . Pelvic mass    History of Present Illness:  Jasmine [name] is a 28 y.o. y.o. female who is seen in consultation at the request of Clent Demark, PA-C for an evaluation of a pelvic mass.  Patient reports abdominal cramping and pain on her right side that started last June.  She saw multiple providers and given no diagnosis felt that she was left to "just deal with it".  For the last 3 months, she has had worsening symptoms including her close feeling tighter, increasing abdominal girth, menstrual abnormalities, fatigue, nausea, intermittent emesis, early satiety, reflux, frequency, and diarrhea.  She has been using Motrin and Tylenol alternating which helps take the edge off of her pain down to a 6-7/10.  She describes the pain in her mid  right abdomen that radiates around to her back.  Activity and sitting worsen the pain, standing up seems to help.  Given her fullness, she has not been eating much food and has been trying to drink Ensure.  Often, she only has had liquid intake recently with her medications.  On CT in 06/2017, the patient had  benign-appearing cystic lesions in bilateral ovaries (left measured 6.4cm with layering hyperdensity suggestive of a hemorrhagic cyst).She was seen again in the ED at [facility] in 09/2018 with pelvic pain and vaginal spotting. She declined pelvic exam at that visit. Given UA results, patient was treated for a UTI. She represented on 11/19 to the ED with lower abdominal pain and abnormal uterine bleeding.  At that time, a CT showed a large pelvic mass.  Patient describes having very regular menses that last for 6 days once a month.  For the last couple of months, she has been bleeding 6 to 7 days every 2 weeks and having passage of large clots.  She also describes diarrhea daily.  She is sexually active without contraception with her husband. They are hoping to have one more child.   In terms of her medical history, the patient has a history of asthma for which she is on Advair and Singulair.  She uses her albuterol inhaler 1 time a month on average.  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  She also has a history of epilepsy and says her last seizure was 2 months ago.  Her antiepileptics were recently increased and she notes much better control since that time.  PAST MEDICAL HISTORY:  Past Medical History:  Diagnosis Date  . Asthma   . Complication of anesthesia    "I wake up during anesthesia" (10/14/2015)  . Cystic fibrosis ([place])    O2 dependent-  very uncertain diagnosis!  Marland Kitchen DVT (deep vein thrombosis) in pregnancy   . Epilepsy ([place])   . GERD (gastroesophageal reflux disease)   . Heart murmur    last work up age 13- no symtoms  . On home oxygen therapy    "2L when I sleep" (07/18/2017)  . Pancreatitis   . Sickle cell trait ([place])   . Sleep apnea    "used to wear CPAP; don't have one here in [place] since I moved in 2014" (10/14/2015)     PAST SURGICAL HISTORY:  Past Surgical History:  Procedure Laterality Date  . APPENDECTOMY    . CESAREAN SECTION   2013  . CESAREAN SECTION N/A 11/08/2014   Procedure: CESAREAN SECTION;  Surgeon: Truett Mainland, DO;  Location: [place] [facility];  Service: Obstetrics;  Laterality: N/A;  . CHOLECYSTECTOMY N/A 10/16/2015   Procedure: LAPAROSCOPIC CHOLECYSTECTOMY WITH POSSIBLE INTRAOPERATIVE CHOLANGIOGRAM;  Surgeon: Donnie Mesa, MD;  Location: [place];  Service: General;  Laterality: N/A;  . CLOSED REDUCTION MANDIBLE WITH MANDIBULOMA N/A 07/17/2017   Procedure: REDUCTION TMJ WITH INTRAMAXILLARY FIXATION;  Surgeon: Diona Browner, DDS;  Location: [place];  Service: Oral Surgery;  Laterality: N/A;  . ESOPHAGOGASTRODUODENOSCOPY (EGD) WITH PROPOFOL N/A 11/15/2016   Procedure: ESOPHAGOGASTRODUODENOSCOPY (EGD) WITH PROPOFOL;  Surgeon: Clarene Essex, MD;  [facility];  Service: Endoscopy;  Laterality: N/A;  . INGUINAL HERNIA REPAIR Bilateral ~ 1996  . NERVE, TENDON AND ARTERY REPAIR Right 09/23/2012   Procedure: I&D and Repair As Necessary/Right Hand and Palm;  Surgeon: Roseanne Kaufman, MD;  Location: [place];  Service: Orthopedics;  Laterality: Right;  . TONSILLECTOMY  OB/GYN HISTORY:  OB History  Gravida Para Term Preterm AB Living  3 3 3     3   SAB TAB Ectopic Multiple Live Births        0 3    # Outcome Date GA Lbr Len/2nd Weight Sex Delivery Anes PTL Lv  3 Term 11/08/14 [redacted]w[redacted]d  5 lb 10.3 oz (2.56 kg) F CS-LTranv Spinal  LIV  2 Term 09/04/11 [redacted]w[redacted]d  5 lb 14 oz (2.665 kg) F CS-LTranv Spinal Y LIV  1 Term 08/28/10 [redacted]w[redacted]d  6 lb (2.722 kg) F Vag-Spont EPI N LIV    Patient's last menstrual period was 12/04/2018 (exact date).  Age at menarche: 15  Hx of STDs: denies, treated for BV during pregnancy Last pap: 2020 History of abnormal pap smears: no  MEDICATIONS: Outpatient Encounter Medications as of 12/22/2018  Medication Sig  . acetaminophen (TYLENOL) 500 MG tablet Take 500 mg by mouth every 6 (six) hours as needed.  Marland Kitchen albuterol (PROVENTIL HFA;VENTOLIN HFA) 108 (90 Base) MCG/ACT inhaler Inhale 2 puffs into the  lungs every 4 (four) hours as needed for wheezing or shortness of breath.  . Amylase-Lipase-Protease (CREON 20 PO) Take 2-7 capsules by mouth See admin instructions. Take 2-7 capsules by mouth with each meal and snack (dose based on amount of food intake)  . Ascorbic Acid (VITAMIN C) 100 MG CHEW Chew 1 tablet (100 mg total) by mouth daily.  . cetirizine (ZYRTEC) 10 MG tablet Take 1 tablet (10 mg total) by mouth daily.  Marland Kitchen docusate sodium (COLACE) 50 MG capsule Take 1 capsule (50 mg total) by mouth 2 (two) times daily as needed for mild constipation.  Marland Kitchen dornase alpha (PULMOZYME) 1 MG/ML nebulizer solution Take 2.5 mLs (2.5 mg total) by nebulization 2 (two) times daily.  . feeding supplement, ENSURE ENLIVE, (ENSURE ENLIVE) LIQD Take 237 mLs by mouth 2 (two) times daily between meals. 474 mL 3 times a day, provide 3-week supply  . Fluticasone-Salmeterol (ADVAIR) 500-50 MCG/DOSE AEPB Inhale 1 puff into the lungs 2 (two) times daily.  . Ibuprofen 200 MG CAPS Take 400 mg by mouth every 6 (six) hours as needed.  . levETIRAcetam (KEPPRA) 750 MG tablet Take 1 tablet (750 mg total) by mouth 2 (two) times daily.  . Lumacaftor-Ivacaftor 150-188 MG PACK Take 1 each by nebulization 2 (two) times daily.  . montelukast (SINGULAIR) 10 MG tablet Take 10 mg by mouth at bedtime.  . Multiple Vitamin (MULTIVITAMIN WITH MINERALS) TABS tablet Take 2 tablets by mouth daily.  . SUMAtriptan (IMITREX) 50 MG tablet Take 1 tablet (50 mg total) by mouth daily. May take one more tablet two hours after the first. No more than two tablets per day.  . tobramycin, PF, (TOBI) 300 MG/5ML nebulizer solution Take 5 mLs (300 mg total) by nebulization every 12 (twelve) hours.  . topiramate (TOPAMAX) 50 MG tablet Take 1 tablet (50 mg total) by mouth at bedtime.  . [DISCONTINUED] HYDROcodone-acetaminophen (NORCO/VICODIN) 5-325 MG tablet Take 1 tablet by mouth every 6 (six) hours as needed for moderate pain (pain).  . [DISCONTINUED]  methylPREDNISolone (MEDROL DOSEPAK) 4 MG TBPK tablet AS DIRECTED  . [DISCONTINUED] nitrofurantoin, macrocrystal-monohydrate, (MACROBID) 100 MG capsule Take 1 capsule (100 mg total) by mouth 2 (two) times daily.   No facility-administered encounter medications on file as of 12/22/2018.     ALLERGIES:  Allergies  Allergen Reactions  . Cheese Shortness Of Breath  . Chocolate Shortness Of Breath  . Ibuprofen Hives and Shortness Of  Breath    Children's ibuprofen  . Ivp Dye [Iodinated Diagnostic Agents] Hives, Shortness Of Breath and Itching  . Latex Anaphylaxis, Swelling and Other (See Comments)    Reaction:  Localized swelling   . Orange Juice [Orange Oil] Shortness Of Breath  . Other Hives and Other (See Comments)    Pt states that she is allergic to all steroids except IV solu-medrol.    Marland Kitchen Peach [Prunus Persica] Anaphylaxis  . Peanuts [Peanut Oil] Anaphylaxis  . Pear Anaphylaxis  . Prednisone Hives  . Raspberry Anaphylaxis  . Tylenol [Acetaminophen] Hives, Shortness Of Breath and Other (See Comments)    Pt states that this is only with the liquid form  . Amoxicillin Hives and Other (See Comments)    Has patient had a PCN reaction causing immediate rash, facial/tongue/throat swelling, SOB or lightheadedness with hypotension: No Has patient had a PCN reaction causing severe rash involving mucus membranes or skin necrosis: No Has patient had a PCN reaction that required hospitalization: No Has patient had a PCN reaction occurring within the last 10 years: No If all of the above answers are "NO", then may proceed with Cephalosporin use.  Marland Kitchen Apricot Flavor Hives  . Doxycycline Hives  . Erythromycin Hives  . Milk Of Magnesia [Magnesium Hydroxide] Hives and Itching  . Penicillins Hives and Other (See Comments)    Has patient had a PCN reaction causing immediate rash, facial/tongue/throat swelling, SOB or lightheadedness with hypotension: No Has patient had a PCN reaction causing severe  rash involving mucus membranes or skin necrosis: No Has patient had a PCN reaction that required hospitalization: No Has patient had a PCN reaction occurring within the last 10 years: No If all of the above answers are "NO", then may proceed with Cephalosporin use.     FAMILY HISTORY:  Family History  Problem Relation Age of Onset  . Cancer Mother        cervical cancer  . Asthma Mother   . Hypertension Father   . Sickle cell anemia Father   . Asthma Sister   . Diabetes Maternal Aunt   . Lymphoma Maternal Grandmother      SOCIAL HISTORY:  Relationships  Social connections  . Talks on phone: Not on file  . Gets together: Not on file  . Attends religious service: Not on file  . Active member of club or organization: Not on file  . Attends meetings of clubs or organizations: Not on file  . Relationship status: Not on file    REVIEW OF SYSTEMS:  Reports appetite changes, abdominal distention and pain, diarrhea, nausea, emesis, urinary frequency, pelvic pain, vaginal bleeding, and back pain. Denies fevers, chills, fatigue, unexplained weight changes. Denies hearing loss, neck lumps or masses, mouth sores, ringing in ears or voice changes. Denies cough or wheezing.  Denies shortness of breath. Denies chest pain or palpitations. Denies leg swelling. Denies blood in stools, constipation. Denies pain with intercourse, dysuria, hematuria or incontinence. Denies hot flashes or vaginal discharge.   Denies joint pain or muscle pain/cramps. Denies itching, rash, or wounds. Denies dizziness, headaches, numbness or seizures. Denies swollen lymph nodes or glands, denies easy bruising or bleeding. Denies anxiety, depression, confusion, or decreased concentration.   Physical Exam:  Vital Signs for this encounter:  Blood pressure 133/88, pulse (!) 106, temperature 98.5 F (36.9 C), temperature source Temporal, resp. rate 18, height 5\' 6"  (1.676 m), weight 165 lb (74.8 kg), last menstrual  period 12/04/2018, SpO2 100 %. Body mass index  is 26.63 kg/m. General: Alert, oriented, no acute distress.  Decreased skin turgor. HEENT: Normocephalic, atraumatic. Sclera anicteric.  Chest: Clear to auscultation bilaterally.  Cardiovascular: Tachycardic, regular rhythm, no murmurs, rubs, or gallops.  Abdomen: Normoactive bowel sounds.  Tense, markedly distended, mildly tender to palpation diffusely.  Large mass occupying most of abdomen and pelvis, almost up to the xiphoid. No palpable fluid wave.  Extremities: Grossly normal range of motion. Warm, well perfused. No edema bilaterally.  Skin: No rashes or lesions.  Lymphatics: No cervical, supraclavicular, or inguinal adenopathy.  GU:  Normal external female genitalia. No lesions. + bleeding.             Bladder/urethra:  No lesions or masses, well supported bladder             Vagina: Well rugated vaginal mucosa, moderate amount of blood within the vault.             Cervix: Normal appearing, no lesions. Normal on palpation.             Uterus: Difficult to appreciate secondary to large mass occupying much of the pelvis         Rectal: no nodularity  LABORATORY AND RADIOLOGIC DATA:  11/24:  Ref Range & Units 13d ago  Cancer Antigen (CA) 125 0.0 - 38.1 U/mL 94.4High      11/24 pap: Adequacy Satisfactory for evaluation; transformation zone component PRESENT.   Diagnosis - Negative for intraepithelial lesion or malignancy (NILM)    Pelvic ultrasound 11/19: Lower chest: Areas of bibasilar subsegmental atelectasis. No effusions. Heart is normal size.  Hepatobiliary: No focal liver abnormality is seen. Status post cholecystectomy. No biliary dilatation.  Pancreas: No focal abnormality or ductal dilatation.  Spleen: No focal abnormality.  Normal size.  Adrenals/Urinary Tract: No adrenal abnormality. No focal renal abnormality. No stones or hydronephrosis. Urinary bladder is unremarkable.  Stomach/Bowel: Stomach, large and  small bowel grossly unremarkable.  Vascular/Lymphatic: No evidence of aneurysm or adenopathy.  Reproductive: Very large cystic mass extends from the pelvis into the upper abdomen. There are bilateral adnexal cystic masses on prior CT, but this is new and much larger. This mass measures 30 x 22 x 14 cm. There are soft tissue components with nodularity and septations in the left anterior portion of this large mass. Additional cystic mass noted in the pelvis/left adnexa measuring up to 5.3 cm. Uterus unremarkable.  Other: Trace free fluid in the pelvis.  No free air.  Musculoskeletal: No acute bony abnormality.  IMPRESSION: 30 cm complex cystic mass arising from the pelvis and extending into the upper abdomen. This is either new or represents marked enlargement of 1 of the previously seen adnexal cystic masses on prior CT. This is most compatible with cystic ovarian neoplasm. Recommend gynecologic consultation.  Additional 5.3 cm cystic mass in the left adnexa.

## 2018-12-22 NOTE — Progress Notes (Signed)
Left message for patient via Joylene John NP,  for pt to return my call. No call back at this time. Attempted to call patient again, and  received a message indicating that patient does not have "voice message set up", and that a SMS message can be sent.  SMS message sent, and awaiting a return call.   Per discussion with Konrad Felix, PA ,pt case may be moved to [facility]. Chart given to Short Stay for monitoring of surgery move and/or Short Stay staff can contact patient for pre-op phone interview and instructions.

## 2018-12-22 NOTE — Progress Notes (Addendum)
Anesthesia Chart Review   Case: [id number] Date/Time: 12/23/18 1330   Procedure: OPEN UNILATERAL  SALPINGO OOPHORECTOMY WITH POSSIBLE STAGEING (N/A )   Anesthesia type: General   Pre-op diagnosis: PELVIC MASS   Location: [facility] [place] [facility]   Surgeon: Lafonda Mosses, MD      DISCUSSION:28 y.o. former smoker (1 pack years, quit 10/02/15) with h/o asthma (has been intubated in the past), cystic fibrosis, sleep apnea, 2L O2 at night, epilepsy (controlled on Keppra), GERD, sickle cell trait, pelvic mass scheduled for above procedure 12/23/2018 with Dr. Jeral Pinch.    Pt with 30 cm complex cystic mass arising from the pelvis and extending into the upper abdomen.   Per Dr. Berline Lopes, "Given her significant symptoms and evident dehydration, we will try to admit the patient today for add-on surgery tomorrow.  If this is not possible due to bed availability, then we will plan to hydrate the patient in the infusion center and get labs today.  The patient will also require Covid testing as well as medical clearance given her pulmonary comorbidities and seizure history."  No complications with tmj reduction with GA 07/17/2017.    Nurse has attempted to contact pt to go over health history and medications, unable to contact.   Discussed with Dr. Roanna Banning.  Pt needs to be evaluated by pulmonology and cleared for procedure prior to proceeding.  I have let Joylene John, NP know.   Addendum 12/24/2018:  Pt seen by pulmonologist, Dr. Noemi Chapel, 12/23/2018 for preoperative evaluation.  Per note, "Mild persistent asthma-very well controlled.  ACT 25- perfect control. -Continue high-dose Advair twice daily.  She should use this morning of surgery. -Continue albuterol as needed -Continue Singulair daily -Continue allergic rhinosinusitis management -While admitted to the hospital, if she was unable to use a LABA/ ICS inhaler (Breo is on formulary), can use budesonide nebulizers twice daily and albuterol  or DuoNebs as needed. -Reports up-to-date on seasonal flu vaccine -Continue COVID-19 precautions- mask wearing, handwashing, social distancing.  Preop respiratory clearance- ARISCAT score 42 (intermediate risk) assuming upper abdominal incision and 2 to 3-hour operative time with preoperative anemia. -Patient is optimized for surgery currently.  She should use her Advair the morning of surgery.  Albuterol can be used as needed.  Pulmonology can be consulted while she is admitted if there are any concerns about her asthma. -I discussed the importance of good pulmonary hygiene, including taking deep breaths and coughing postoperatively to prevent pneumonia.  She also needs to be out of bed and walking as soon as safely possible. -DVT prophylaxis as soon as reasonably possible after surgery. She has had a provoked DVT in the past while pregnant."   VS: LMP 12/04/2018 (Exact Date)   PROVIDERS: Clent Demark, PA-C is PCP    LABS: SDW (all labs ordered are listed, but only abnormal results are displayed)  Labs Reviewed - No data to display   IMAGES: CT Abdomen Pelvis 12/04/2018 IMPRESSION: 30 cm complex cystic mass arising from the pelvis and extending into the upper abdomen. This is either new or represents marked enlargement of 1 of the previously seen adnexal cystic masses on prior CT. This is most compatible with cystic ovarian neoplasm. Recommend gynecologic consultation.  Additional 5.3 cm cystic mass in the left adnexa.  These results were called by telephone at the time of interpretation on 12/04/2018 at 10:43 pm to provider JOSHUA LONG , who verbally acknowledged these results.  EKG: 10/06/2018 Rate 117 bpm Sinus  tachycardia  Nonspecific T wave abnormality   CV:  Past Medical History:  Diagnosis Date  . Asthma   . Complication of anesthesia    "I wake up during anesthesia" (10/14/2015)  . Cystic fibrosis ([place])    O2 dependent-  very uncertain diagnosis!  Marland Kitchen  DVT (deep vein thrombosis) in pregnancy   . Epilepsy ([place])   . GERD (gastroesophageal reflux disease)   . Heart murmur    last work up age 62- no symtoms  . On home oxygen therapy    "2L when I sleep" (07/18/2017)  . Pancreatitis   . Sickle cell trait ([place])   . Sleep apnea    "used to wear CPAP; don't have one here in [place] since I moved in 2014" (10/14/2015)    Past Surgical History:  Procedure Laterality Date  . APPENDECTOMY    . CESAREAN SECTION  2013  . CESAREAN SECTION N/A 11/08/2014   Procedure: CESAREAN SECTION;  Surgeon: Truett Mainland, DO;  Location: [place] [facility];  Service: Obstetrics;  Laterality: N/A;  . CHOLECYSTECTOMY N/A 10/16/2015   Procedure: LAPAROSCOPIC CHOLECYSTECTOMY WITH POSSIBLE INTRAOPERATIVE CHOLANGIOGRAM;  Surgeon: Donnie Mesa, MD;  Location: [place];  Service: General;  Laterality: N/A;  . CLOSED REDUCTION MANDIBLE WITH MANDIBULOMA N/A 07/17/2017   Procedure: REDUCTION TMJ WITH INTRAMAXILLARY FIXATION;  Surgeon: Diona Browner, DDS;  Location: [place];  Service: Oral Surgery;  Laterality: N/A;  . ESOPHAGOGASTRODUODENOSCOPY (EGD) WITH PROPOFOL N/A 11/15/2016   Procedure: ESOPHAGOGASTRODUODENOSCOPY (EGD) WITH PROPOFOL;  Surgeon: Clarene Essex, MD;  [facility];  Service: Endoscopy;  Laterality: N/A;  . INGUINAL HERNIA REPAIR Bilateral ~ 1996  . NERVE, TENDON AND ARTERY REPAIR Right 09/23/2012   Procedure: I&D and Repair As Necessary/Right Hand and Palm;  Surgeon: Roseanne Kaufman, MD;  Location: [place];  Service: Orthopedics;  Laterality: Right;  . TONSILLECTOMY      MEDICATIONS: No current facility-administered medications for this encounter.    Marland Kitchen acetaminophen (TYLENOL) 500 MG tablet  . albuterol (PROVENTIL HFA;VENTOLIN HFA) 108 (90 Base) MCG/ACT inhaler  . Amylase-Lipase-Protease (CREON 20 PO)  . Ascorbic Acid (VITAMIN C) 100 MG CHEW  . cetirizine (ZYRTEC) 10 MG tablet  . docusate sodium (COLACE) 50 MG capsule  . dornase alpha (PULMOZYME) 1 MG/ML nebulizer  solution  . feeding supplement, ENSURE ENLIVE, (ENSURE ENLIVE) LIQD  . Fluticasone-Salmeterol (ADVAIR) 500-50 MCG/DOSE AEPB  . Ibuprofen 200 MG CAPS  . levETIRAcetam (KEPPRA) 750 MG tablet  . Lumacaftor-Ivacaftor 150-188 MG PACK  . montelukast (SINGULAIR) 10 MG tablet  . Multiple Vitamin (MULTIVITAMIN WITH MINERALS) TABS tablet  . SUMAtriptan (IMITREX) 50 MG tablet  . tobramycin, PF, (TOBI) 300 MG/5ML nebulizer solution  . topiramate (TOPAMAX) 50 MG tablet   . 0.9 %  sodium chloride infusion   Maia Plan [facility] Pre-Surgical Testing [phone] 12/22/18  4:00 PM

## 2018-12-22 NOTE — Patient Instructions (Signed)
Preparing for your Surgery  Plan for surgery on December 23, 2018 with Dr. Jeral Pinch at [place] will be scheduled for a exploratory laparotomy, unilateral salpingo-oophorectomy, possible staging.   Pre-operative Testing -You will receive a phone call from presurgical testing at [facility] if you have not received a call already to arrange for a pre-operative testing appointment before your surgery.  This appointment should occur tomorrow am.  -Bring your insurance card, copy of an advanced directive if applicable, medication list  -At that visit, you will be asked to sign a consent for a possible blood transfusion in case a transfusion becomes necessary during surgery.  The need for a blood transfusion is rare but having consent is a necessary part of your care.     -You should not be taking blood thinners or aspirin at least ten days prior to surgery unless instructed by your surgeon.  -As part of our enhanced surgical recovery pathway, you may be advised to drink a carbohydrate drink the morning of surgery (at least 3 hours before). If you are diabetic, this will be substituted with G2 gatorade in order to prevent elevated glucose levels prior to surgery.  -Do not take supplements such as fish oil (omega 3), red yeast rice, tumeric before your surgery.  -STOP USING IBUPROFEN NOW.  USE TYLENOL FOR PAIN.  Day Before Surgery at [place] will be asked to take in a light diet the day before surgery.  Avoid carbonated beverages.  You will be advised to have nothing to eat or drink after midnight the evening before.    Eat a light diet the day before surgery.  Examples including soups, broths, toast, yogurt, mashed potatoes.  Things to avoid include carbonated beverages (fizzy beverages), raw fruits and raw vegetables, or beans.   If your bowels are filled with gas, your surgeon will have difficulty visualizing your pelvic organs which increases your surgical  risks.  Your role in recovery Your role is to become active as soon as directed by your doctor, while still giving yourself time to heal.  Rest when you feel tired. You will be asked to do the following in order to speed your recovery:  - Cough and breathe deeply. This helps toclear and expand your lungs and can prevent pneumonia.  - Do mild physical activity. Walking or moving your legs help your circulation and body functions return to normal. A staff member will help you when you try to walk and will provide you with simple exercises. Do not try to get up or walk alone the first time. - Actively manage your pain. Managing your pain lets you move in comfort. We will ask you to rate your pain on a scale of zero to 10. It is your responsibility to tell your doctor or nurse where and how much you hurt so your pain can be treated.  Special Considerations -If you are diabetic, you may be placed on insulin after surgery to have closer control over your blood sugars to promote healing and recovery.  This does not mean that you will be discharged on insulin.  If applicable, your oral antidiabetics will be resumed when you are tolerating a solid diet.  -Your final pathology results from surgery should be available around one week after surgery and the results will be relayed to you when available.  -FMLA forms can be faxed to [phone] and please allow 5-7 business days for completion.   Blood Transfusion Information WHAT IS  A BLOOD TRANSFUSION? A transfusion is the replacement of blood or some of its parts. Blood is made up of multiple cells which provide different functions.  Red blood cells carry oxygen and are used for blood loss replacement.  White blood cells fight against infection.  Platelets control bleeding.  Plasma helps clot blood.  Other blood products are available for specialized needs, such as hemophilia or other clotting disorders. BEFORE THE TRANSFUSION  Who gives blood  for transfusions?   You may be able to donate blood to be used at a later date on yourself (autologous donation).  Relatives can be asked to donate blood. This is generally not any safer than if you have received blood from a stranger. The same precautions are taken to ensure safety when a relative's blood is donated.  Healthy volunteers who are fully evaluated to make sure their blood is safe. This is blood bank blood. Transfusion therapy is the safest it has ever been in the practice of medicine. Before blood is taken from a donor, a complete history is taken to make sure that person has no history of diseases nor engages in risky social behavior (examples are intravenous drug use or sexual activity with multiple partners). The donor's travel history is screened to minimize risk of transmitting infections, such as malaria. The donated blood is tested for signs of infectious diseases, such as HIV and hepatitis. The blood is then tested to be sure it is compatible with you in order to minimize the chance of a transfusion reaction. If you or a relative donates blood, this is often done in anticipation of surgery and is not appropriate for emergency situations. It takes many days to process the donated blood. RISKS AND COMPLICATIONS Although transfusion therapy is very safe and saves many lives, the main dangers of transfusion include:   Getting an infectious disease.  Developing a transfusion reaction. This is an allergic reaction to something in the blood you were given. Every precaution is taken to prevent this. The decision to have a blood transfusion has been considered carefully by your caregiver before blood is given. Blood is not given unless the benefits outweigh the risks.  AFTER SURGERY INSTRUCTIONS 12/22/2018  Return to work: 4-6 weeks if applicable  Activity: 1. Be up and out of the bed during the day.  Take a nap if needed.  You may walk up steps but be careful and use the hand rail.   Stair climbing will tire you more than you think, you may need to stop part way and rest.   2. No lifting or straining for 6 weeks OVER 10 POUNDS.  3. No driving for 2 week(s).  Do not drive if you are taking narcotic pain medicine.  4. Shower daily as soon as the next day.  Use soap and water on your incision and pat dry; don't rub.  No tub baths until cleared by your surgeon.   5. No sexual activity and nothing in the vagina for 2 weeks.  6. You may experience a small amount of clear drainage from your incision, which can be normal.  If the drainage persists or increases, please call the office.  8. AFTER SURGERY: Take Tylenol or ibuprofen first for pain and only use narcotic pain medication for severe pain not relieved by the Tylenol or Ibuprofen.  Monitor your Tylenol intake to a max of 4,000 mg.  Diet: 1. Low sodium Heart Healthy Diet is recommended.  2. It is safe to use a  laxative, such as Miralax or Colace, if you have difficulty moving your bowels. You can take Sennakot at bedtime every evening to keep bowel movements regular and to prevent constipation.    Wound Care: 1. Keep clean and dry.  Shower daily.  Reasons to call the Doctor:  Fever - Oral temperature greater than 100.4 degrees Fahrenheit  Foul-smelling vaginal discharge  Difficulty urinating  Nausea and vomiting  Increased pain at the site of the incision that is unrelieved with pain medicine.  Difficulty breathing with or without chest pain  New calf pain especially if only on one side  Sudden, continuing increased vaginal bleeding with or without clots.   Contacts: For questions or concerns you should contact:  Dr. Jeral Pinch at [phone]  Joylene John, NP at [phone]  After Hours: call [phone] and have the GYN Oncologist paged/contacted

## 2018-12-22 NOTE — Progress Notes (Unsigned)
I've called patient multiple times after speaking with the anesthesiologist regarding preoperative clearance. As I was not able to get the patient in to see someone at her primary care clinic until next Monday, we have decided that having the patient come into the emergency department to be admitted to help expedite her surgery given her significant symptoms is the most prudent. I will continue to attempt to contact the patient however her phone rings without an answer and her voicemail box is full.  Jeral Pinch MD

## 2018-12-22 NOTE — Progress Notes (Addendum)
PCP - Geryl Rankins, NP and Domenica Fail, MD  Cardiologist -   Pulmonary clearance 12-24-18 on chart  Chest x-ray - 10-04-18 CT Abdomen 12-04-18   EKG - 10-04-18   Stress Test -  ECHO -  Cardiac Cath -   Sleep Study -  CPAP -   Fasting Blood Sugar -  Checks Blood Sugar _____ times a day  Blood Thinner Instructions: Aspirin Instructions: Last Dose:  Anesthesia review: Hx of Seizure, Sickle Cell Trait, Asthma, DVT, Heart Murmur, Epilepsy, 02 @2L  at noc  Pt reports that she takes her Keppra BID, rarely uses her inhaler, and does not see a Neurologist or Pulmonologist

## 2018-12-22 NOTE — Progress Notes (Signed)
Patient scheduled for surgery tomorrow for 30 cm pelvic mass.  Given 2 liters of normal saline today due to dehydration here at the [facility].  Attempted to direct admit patient today but unable due to no available beds. 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  Patient has not been seen at her PCP for over a year and the next appt there would be on Dec 14 but it is felt that the patient cannot wait this long per Dr. Berline Lopes.    Dr. Berline Lopes has attempted to reach patient but pt unavailable and unable to leave a message.  Plan to recommend patient seek care in the emergency department for evaluation and admission so clearance can be obtained for this urgent surgery. Significant difficulty has been met trying to arrange outpatient in an urgent manner.

## 2018-12-22 NOTE — H&P (View-Only) (Signed)
GYNECOLOGIC ONCOLOGY NEW PATIENT CONSULTATION   Patient Name: Jasmine [name]  Patient Age: 28 y.o. Date of Service: 12/22/18 Referring Provider: Clent Demark, PA-C No address on file   Primary Care Provider: Clent Demark, PA-C Consulting Provider: Jeral Pinch, MD   Assessment/Plan:  28 year old with large abdominal pelvic mass likely of ovarian origin.  We reviewed imaging findings of a large pelvic mass likely emanating from one of her ovaries.  On review of imaging from last year, the patient had bilateral cystic masses at that time.  While the 30 cm mass is mostly cystic, there are some soft tissue nodularities and septations.  This complexity raises the concern for possible malignancy.  Patient had a CA-125 recently that was mildly elevated at just under 100.  Given her age, we discussed getting other tumor markers for germ cell and sex cord stromal tumors today.  The plan will be for an exploratory laparotomy with unilateral salpingo-oophorectomy.  We will start with a mini lap and if it is possible after palpation of the abdomen to decompress the cyst in a controlled manner, then we will proceed with a smaller incision.  If concern for malignancy is great enough and controlled decompression does not seem possible, then the incision will be extended.  We will plan to send the mass for frozen section at the time of surgery.  If no malignancy is found, then I will plan to inspect the smaller cystic mass on the other ovary only.  If malignancy is encountered, then depending on the histology, the patient will likely need staging which at a minimum would include omentectomy and lymph node sampling.  Given her age and desire to maintain fertility, I will leave her uterus and other fallopian tube and ovary in situ if possible.  The patient voices understanding of this plan.  Additionally, given her history of irregular bleeding, we discussed an endometrial biopsy versus D&C in the OR  tomorrow.  Given her significant symptoms and evident dehydration, we will try to admit the patient today for add-on surgery tomorrow.  If this is not possible due to bed availability, then we will plan to hydrate the patient in the infusion center and get labs today.  The patient will also require Covid testing as well as medical clearance given her pulmonary comorbidities and seizure history.  Plan for surgery: exlap, USO, possible staging including omentectomy, lymph node biopsies, possible contralateral USO and total hysterectomy  A copy of this note was sent to the patient's referring provider.   Jeral Pinch, MD  Division of Gynecologic Oncology  Department of Obstetrics and Gynecology  [facility]  ___________________________________________  Chief Complaint: Chief Complaint  Patient presents with  . Pelvic mass    History of Present Illness:  Jasmine [name] is a 28 y.o. y.o. female who is seen in consultation at the request of Clent Demark, PA-C for an evaluation of a pelvic mass.  Patient reports abdominal cramping and pain on her right side that started last June.  She saw multiple providers and given no diagnosis felt that she was left to "just deal with it".  For the last 3 months, she has had worsening symptoms including her close feeling tighter, increasing abdominal girth, menstrual abnormalities, fatigue, nausea, intermittent emesis, early satiety, reflux, frequency, and diarrhea.  She has been using Motrin and Tylenol alternating which helps take the edge off of her pain down to a 6-7/10.  She describes the pain in her mid  right abdomen that radiates around to her back.  Activity and sitting worsen the pain, standing up seems to help.  Given her fullness, she has not been eating much food and has been trying to drink Ensure.  Often, she only has had liquid intake recently with her medications.  On CT in 06/2017, the patient had  benign-appearing cystic lesions in bilateral ovaries (left measured 6.4cm with layering hyperdensity suggestive of a hemorrhagic cyst).She was seen again in the ED at [facility] in 09/2018 with pelvic pain and vaginal spotting. She declined pelvic exam at that visit. Given UA results, patient was treated for a UTI. She represented on 11/19 to the ED with lower abdominal pain and abnormal uterine bleeding.  At that time, a CT showed a large pelvic mass.  Patient describes having very regular menses that last for 6 days once a month.  For the last couple of months, she has been bleeding 6 to 7 days every 2 weeks and having passage of large clots.  She also describes diarrhea daily.  She is sexually active without contraception with her husband. They are hoping to have one more child.   In terms of her medical history, the patient has a history of asthma for which she is on Advair and Singulair.  She uses her albuterol inhaler 1 time a month on average.  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  She also has a history of epilepsy and says her last seizure was 2 months ago.  Her antiepileptics were recently increased and she notes much better control since that time.  PAST MEDICAL HISTORY:  Past Medical History:  Diagnosis Date  . Asthma   . Complication of anesthesia    "I wake up during anesthesia" (10/14/2015)  . Cystic fibrosis ([place])    O2 dependent-  very uncertain diagnosis!  Marland Kitchen DVT (deep vein thrombosis) in pregnancy   . Epilepsy ([place])   . GERD (gastroesophageal reflux disease)   . Heart murmur    last work up age 56- no symtoms  . On home oxygen therapy    "2L when I sleep" (07/18/2017)  . Pancreatitis   . Sickle cell trait ([place])   . Sleep apnea    "used to wear CPAP; don't have one here in [place] since I moved in 2014" (10/14/2015)     PAST SURGICAL HISTORY:  Past Surgical History:  Procedure Laterality Date  . APPENDECTOMY    . CESAREAN SECTION   2013  . CESAREAN SECTION N/A 11/08/2014   Procedure: CESAREAN SECTION;  Surgeon: Truett Mainland, DO;  Location: [facility];  Service: Obstetrics;  Laterality: N/A;  . CHOLECYSTECTOMY N/A 10/16/2015   Procedure: LAPAROSCOPIC CHOLECYSTECTOMY WITH POSSIBLE INTRAOPERATIVE CHOLANGIOGRAM;  Surgeon: Donnie Mesa, MD;  Location: [place];  Service: General;  Laterality: N/A;  . CLOSED REDUCTION MANDIBLE WITH MANDIBULOMA N/A 07/17/2017   Procedure: REDUCTION TMJ WITH INTRAMAXILLARY FIXATION;  Surgeon: Diona Browner, DDS;  Location: [place] [facility];  Service: Oral Surgery;  Laterality: N/A;  . ESOPHAGOGASTRODUODENOSCOPY (EGD) WITH PROPOFOL N/A 11/15/2016   Procedure: ESOPHAGOGASTRODUODENOSCOPY (EGD) WITH PROPOFOL;  Surgeon: Clarene Essex, MD;  Location: [facility];  Service: Endoscopy;  Laterality: N/A;  . INGUINAL HERNIA REPAIR Bilateral ~ 1996  . NERVE, TENDON AND ARTERY REPAIR Right 09/23/2012   Procedure: I&D and Repair As Necessary/Right Hand and Palm;  Surgeon: Roseanne Kaufman, MD;  Location: [facility];  Service: Orthopedics;  Laterality: Right;  . TONSILLECTOMY  OB/GYN HISTORY:  OB History  Gravida Para Term Preterm AB Living  3 3 3     3   SAB TAB Ectopic Multiple Live Births        0 3    # Outcome Date GA Lbr Len/2nd Weight Sex Delivery Anes PTL Lv  3 Term 11/08/14 [redacted]w[redacted]d  5 lb 10.3 oz (2.56 kg) F CS-LTranv Spinal  LIV  2 Term 09/04/11 [redacted]w[redacted]d  5 lb 14 oz (2.665 kg) F CS-LTranv Spinal Y LIV  1 Term 08/28/10 [redacted]w[redacted]d  6 lb (2.722 kg) F Vag-Spont EPI N LIV    Patient's last menstrual period was 12/04/2018 (exact date).  Age at menarche: 59  Hx of STDs: denies, treated for BV during pregnancy Last pap: 2020 History of abnormal pap smears: no  MEDICATIONS: Outpatient Encounter Medications as of 12/22/2018  Medication Sig  . acetaminophen (TYLENOL) 500 MG tablet Take 500 mg by mouth every 6 (six) hours as needed.  Marland Kitchen albuterol (PROVENTIL HFA;VENTOLIN HFA) 108 (90 Base) MCG/ACT inhaler Inhale 2 puffs into the  lungs every 4 (four) hours as needed for wheezing or shortness of breath.  . Amylase-Lipase-Protease (CREON 20 PO) Take 2-7 capsules by mouth See admin instructions. Take 2-7 capsules by mouth with each meal and snack (dose based on amount of food intake)  . Ascorbic Acid (VITAMIN C) 100 MG CHEW Chew 1 tablet (100 mg total) by mouth daily.  . cetirizine (ZYRTEC) 10 MG tablet Take 1 tablet (10 mg total) by mouth daily.  Marland Kitchen docusate sodium (COLACE) 50 MG capsule Take 1 capsule (50 mg total) by mouth 2 (two) times daily as needed for mild constipation.  Marland Kitchen dornase alpha (PULMOZYME) 1 MG/ML nebulizer solution Take 2.5 mLs (2.5 mg total) by nebulization 2 (two) times daily.  . feeding supplement, ENSURE ENLIVE, (ENSURE ENLIVE) LIQD Take 237 mLs by mouth 2 (two) times daily between meals. 474 mL 3 times a day, provide 3-week supply  . Fluticasone-Salmeterol (ADVAIR) 500-50 MCG/DOSE AEPB Inhale 1 puff into the lungs 2 (two) times daily.  . Ibuprofen 200 MG CAPS Take 400 mg by mouth every 6 (six) hours as needed.  . levETIRAcetam (KEPPRA) 750 MG tablet Take 1 tablet (750 mg total) by mouth 2 (two) times daily.  . Lumacaftor-Ivacaftor 150-188 MG PACK Take 1 each by nebulization 2 (two) times daily.  . montelukast (SINGULAIR) 10 MG tablet Take 10 mg by mouth at bedtime.  . Multiple Vitamin (MULTIVITAMIN WITH MINERALS) TABS tablet Take 2 tablets by mouth daily.  . SUMAtriptan (IMITREX) 50 MG tablet Take 1 tablet (50 mg total) by mouth daily. May take one more tablet two hours after the first. No more than two tablets per day.  . tobramycin, PF, (TOBI) 300 MG/5ML nebulizer solution Take 5 mLs (300 mg total) by nebulization every 12 (twelve) hours.  . topiramate (TOPAMAX) 50 MG tablet Take 1 tablet (50 mg total) by mouth at bedtime.  . [DISCONTINUED] HYDROcodone-acetaminophen (NORCO/VICODIN) 5-325 MG tablet Take 1 tablet by mouth every 6 (six) hours as needed for moderate pain (pain).  . [DISCONTINUED]  methylPREDNISolone (MEDROL DOSEPAK) 4 MG TBPK tablet AS DIRECTED  . [DISCONTINUED] nitrofurantoin, macrocrystal-monohydrate, (MACROBID) 100 MG capsule Take 1 capsule (100 mg total) by mouth 2 (two) times daily.   No facility-administered encounter medications on file as of 12/22/2018.     ALLERGIES:  Allergies  Allergen Reactions  . Cheese Shortness Of Breath  . Chocolate Shortness Of Breath  . Ibuprofen Hives and Shortness Of  Breath    Children's ibuprofen  . Ivp Dye [Iodinated Diagnostic Agents] Hives, Shortness Of Breath and Itching  . Latex Anaphylaxis, Swelling and Other (See Comments)    Reaction:  Localized swelling   . Orange Juice [Orange Oil] Shortness Of Breath  . Other Hives and Other (See Comments)    Pt states that she is allergic to all steroids except IV solu-medrol.    Marland Kitchen Peach [Prunus Persica] Anaphylaxis  . Peanuts [Peanut Oil] Anaphylaxis  . Pear Anaphylaxis  . Prednisone Hives  . Raspberry Anaphylaxis  . Tylenol [Acetaminophen] Hives, Shortness Of Breath and Other (See Comments)    Pt states that this is only with the liquid form  . Amoxicillin Hives and Other (See Comments)    Has patient had a PCN reaction causing immediate rash, facial/tongue/throat swelling, SOB or lightheadedness with hypotension: No Has patient had a PCN reaction causing severe rash involving mucus membranes or skin necrosis: No Has patient had a PCN reaction that required hospitalization: No Has patient had a PCN reaction occurring within the last 10 years: No If all of the above answers are "NO", then may proceed with Cephalosporin use.  Marland Kitchen Apricot Flavor Hives  . Doxycycline Hives  . Erythromycin Hives  . Milk Of Magnesia [Magnesium Hydroxide] Hives and Itching  . Penicillins Hives and Other (See Comments)    Has patient had a PCN reaction causing immediate rash, facial/tongue/throat swelling, SOB or lightheadedness with hypotension: No Has patient had a PCN reaction causing severe  rash involving mucus membranes or skin necrosis: No Has patient had a PCN reaction that required hospitalization: No Has patient had a PCN reaction occurring within the last 10 years: No If all of the above answers are "NO", then may proceed with Cephalosporin use.     FAMILY HISTORY:  Family History  Problem Relation Age of Onset  . Cancer Mother        cervical cancer  . Asthma Mother   . Hypertension Father   . Sickle cell anemia Father   . Asthma Sister   . Diabetes Maternal Aunt   . Lymphoma Maternal Grandmother      SOCIAL HISTORY:  Relationships  Social connections  . Talks on phone: Not on file  . Gets together: Not on file  . Attends religious service: Not on file  . Active member of club or organization: Not on file  . Attends meetings of clubs or organizations: Not on file  . Relationship status: Not on file    REVIEW OF SYSTEMS:  Reports appetite changes, abdominal distention and pain, diarrhea, nausea, emesis, urinary frequency, pelvic pain, vaginal bleeding, and back pain. Denies fevers, chills, fatigue, unexplained weight changes. Denies hearing loss, neck lumps or masses, mouth sores, ringing in ears or voice changes. Denies cough or wheezing.  Denies shortness of breath. Denies chest pain or palpitations. Denies leg swelling. Denies blood in stools, constipation. Denies pain with intercourse, dysuria, hematuria or incontinence. Denies hot flashes or vaginal discharge.   Denies joint pain or muscle pain/cramps. Denies itching, rash, or wounds. Denies dizziness, headaches, numbness or seizures. Denies swollen lymph nodes or glands, denies easy bruising or bleeding. Denies anxiety, depression, confusion, or decreased concentration.   Physical Exam:  Vital Signs for this encounter:  Blood pressure 133/88, pulse (!) 106, temperature 98.5 F (36.9 C), temperature source Temporal, resp. rate 18, height 5\' 6"  (1.676 m), weight 165 lb (74.8 kg), last menstrual  period 12/04/2018, SpO2 100 %. Body mass index  is 26.63 kg/m. General: Alert, oriented, no acute distress.  Decreased skin turgor. HEENT: Normocephalic, atraumatic. Sclera anicteric.  Chest: Clear to auscultation bilaterally.  Cardiovascular: Tachycardic, regular rhythm, no murmurs, rubs, or gallops.  Abdomen: Normoactive bowel sounds.  Tense, markedly distended, mildly tender to palpation diffusely.  Large mass occupying most of abdomen and pelvis, almost up to the xiphoid. No palpable fluid wave.  Extremities: Grossly normal range of motion. Warm, well perfused. No edema bilaterally.  Skin: No rashes or lesions.  Lymphatics: No cervical, supraclavicular, or inguinal adenopathy.  GU:  Normal external female genitalia. No lesions. + bleeding.             Bladder/urethra:  No lesions or masses, well supported bladder             Vagina: Well rugated vaginal mucosa, moderate amount of blood within the vault.             Cervix: Normal appearing, no lesions. Normal on palpation.             Uterus: Difficult to appreciate secondary to large mass occupying much of the pelvis         Rectal: no nodularity  LABORATORY AND RADIOLOGIC DATA:  11/24:  Ref Range & Units 13d ago  Cancer Antigen (CA) 125 0.0 - 38.1 U/mL 94.4High      11/24 pap: Adequacy Satisfactory for evaluation; transformation zone component PRESENT.   Diagnosis - Negative for intraepithelial lesion or malignancy (NILM)    Pelvic ultrasound 11/19: Lower chest: Areas of bibasilar subsegmental atelectasis. No effusions. Heart is normal size.  Hepatobiliary: No focal liver abnormality is seen. Status post cholecystectomy. No biliary dilatation.  Pancreas: No focal abnormality or ductal dilatation.  Spleen: No focal abnormality.  Normal size.  Adrenals/Urinary Tract: No adrenal abnormality. No focal renal abnormality. No stones or hydronephrosis. Urinary bladder is unremarkable.  Stomach/Bowel: Stomach, large and  small bowel grossly unremarkable.  Vascular/Lymphatic: No evidence of aneurysm or adenopathy.  Reproductive: Very large cystic mass extends from the pelvis into the upper abdomen. There are bilateral adnexal cystic masses on prior CT, but this is new and much larger. This mass measures 30 x 22 x 14 cm. There are soft tissue components with nodularity and septations in the left anterior portion of this large mass. Additional cystic mass noted in the pelvis/left adnexa measuring up to 5.3 cm. Uterus unremarkable.  Other: Trace free fluid in the pelvis.  No free air.  Musculoskeletal: No acute bony abnormality.  IMPRESSION: 30 cm complex cystic mass arising from the pelvis and extending into the upper abdomen. This is either new or represents marked enlargement of 1 of the previously seen adnexal cystic masses on prior CT. This is most compatible with cystic ovarian neoplasm. Recommend gynecologic consultation.  Additional 5.3 cm cystic mass in the left adnexa.

## 2018-12-23 ENCOUNTER — Encounter (HOSPITAL_COMMUNITY): Payer: Self-pay | Admitting: *Deleted

## 2018-12-23 ENCOUNTER — Ambulatory Visit (INDEPENDENT_AMBULATORY_CARE_PROVIDER_SITE_OTHER): Payer: Medicaid Other | Admitting: Critical Care Medicine

## 2018-12-23 ENCOUNTER — Other Ambulatory Visit: Payer: Self-pay

## 2018-12-23 ENCOUNTER — Encounter: Payer: Self-pay | Admitting: Critical Care Medicine

## 2018-12-23 ENCOUNTER — Telehealth: Payer: Self-pay | Admitting: Gynecologic Oncology

## 2018-12-23 ENCOUNTER — Telehealth: Payer: Self-pay | Admitting: Emergency Medicine

## 2018-12-23 ENCOUNTER — Encounter: Payer: Self-pay | Admitting: Gynecologic Oncology

## 2018-12-23 VITALS — BP 116/62 | HR 89 | Temp 98.3°F | Ht 66.0 in | Wt 168.4 lb

## 2018-12-23 DIAGNOSIS — J309 Allergic rhinitis, unspecified: Secondary | ICD-10-CM

## 2018-12-23 DIAGNOSIS — J4530 Mild persistent asthma, uncomplicated: Secondary | ICD-10-CM | POA: Diagnosis not present

## 2018-12-23 DIAGNOSIS — Z01811 Encounter for preprocedural respiratory examination: Secondary | ICD-10-CM | POA: Diagnosis not present

## 2018-12-23 LAB — BETA HCG QUANT (REF LAB): hCG Quant: <1 m[IU]/mL

## 2018-12-23 LAB — AFP TUMOR MARKER: AFP, Serum, Tumor Marker: 2.7 ng/mL (ref 0.0–8.3)

## 2018-12-23 LAB — INHIBIN B: Inhibin B: 26.9 pg/mL

## 2018-12-23 NOTE — Telephone Encounter (Signed)
I am happy to see her today. You are correct that depending on what is going on additional testing may be needed, but I won't know that until I see her. Is there paperwork she will need to bring with her?  LPC

## 2018-12-23 NOTE — Progress Notes (Signed)
Synopsis: Referred in December 2020 for asthma by Clent Demark, PA-C.  Previously patient of Dr. Lamonte Sakai in 2016.  Subjective:   PATIENT ID: Jasmine [name] GENDER: female DOB: 1990/08/18, MRN: [id number]  Chief Complaint  Patient presents with   Consult    Patient is here for surgical clearence    [name] is a 28 year old woman who presents for preop surgical clearance.  She has a history of asthma.  She has been hospitalized multiple times in the past for asthma, including require intubation with mechanical ventilation.  She was last seen in this clinic in 2016 while she was pregnant.  At that time she was started on Singulair and Advair 500-50.  She has remained on these medications with good control.  She denies wheezing, cough, sputum production, not sternal symptoms.  She can walk several blocks without stopping.  She last used her rescue inhaler about 2 months ago.  She has had her seasonal flu shot.  She attributes her excellent asthma control recently to staying at home and not catching colds.  Her last hospitalization was fall 2019, when she required BiPAP.  She continues to use cetirizine for allergies.  She continues to use dornase alfa nebs.  She quit smoking 3 years ago after a few years smoking a few cigarettes a day.  She does not smoke marijuana or vape.  Her upcoming surgery will be an ex lap with likely unilateral salpingo-oophorectomy, possible staging surgery-scheduled for 12/10 at [facility].  ACT score 25/25.  In her chart she had reported history of cystic fibrosis (reports diagnosed in [place] by sweat chloride test), although this diagnosis was heavily questioned by [facility] and [facility] in the past.  She was previously on 2 L of home oxygen, and lumacaftor-ivacaftor as well as Pulmozyme nebs.  She is no longer on supplemental oxygen or lumacaftor-ivacaftor.  Per review of outside records included below, this was not likely an accurate  diagnosis.     Per [facility] records from [place] on 06/07/2017: Clinics Call to [facility] pharmacy, pt did not pick up her medications from discharge 06/05/17, they are still on hold this morning.  Call to [facility] [phone]) requesting records for pt.   Note for 06/07/17 Call back to [facility] [phone]) which is part of [facility]. They do have patient listed but she has never been seen by their CF center. Will relay to adult pulmonology who will be seeing pt outpatient.   Per discharge summary 06/05/2017 from [facility]: Diagnosed at age 35 with sweat chloride test in [place], where she was followed in a CF clinic.  Was without meds for 7 days prior to admissionat [facility] (reported Symbicort twice daily, albuterol 3-4 times daily, vest therapy 3 times daily with albuterol, Lumakaftor/ ivakaftor, nebulized aztreonam and tobramycin).  Cultures grew only coag negative staph.  Imaging did not suggest an acute infection.  She had a PICC line in place for 3 months at the time of admission, which was removed prior to discharge.  She was started on pancreatic enzyme and multivitamin supplements at that time.  They recommended repeating of sweat chloride testing.   Past Medical History:  Diagnosis Date   Asthma    Complication of anesthesia    "I wake up during anesthesia" (10/14/2015)   DVT (deep vein thrombosis) in pregnancy    Epilepsy ([place])  GERD (gastroesophageal reflux disease)    Heart murmur    last work up age 38- no symtoms   Pancreatitis    Sickle cell trait ([place])    Sleep apnea    "used to wear CPAP; don't have one here in [place] since I moved in 2014" (10/14/2015)     Family History  Problem Relation Age of Onset   Cancer Mother        cervical cancer   Asthma Mother    Hypertension Father    Sickle cell anemia Father     Asthma Sister    Diabetes Maternal Aunt    Lymphoma Maternal Grandmother      Past Surgical History:  Procedure Laterality Date   APPENDECTOMY     CESAREAN SECTION  2013   CESAREAN SECTION N/A 11/08/2014   Procedure: CESAREAN SECTION;  Surgeon: Truett Mainland, DO;  Location: [facility];  Service: Obstetrics;  Laterality: N/A;   CHOLECYSTECTOMY N/A 10/16/2015   Procedure: LAPAROSCOPIC CHOLECYSTECTOMY WITH POSSIBLE INTRAOPERATIVE CHOLANGIOGRAM;  Surgeon: Donnie Mesa, MD;  Location: [place];  Service: General;  Laterality: N/A;   CLOSED REDUCTION MANDIBLE WITH MANDIBULOMA N/A 07/17/2017   Procedure: REDUCTION TMJ WITH INTRAMAXILLARY FIXATION;  Surgeon: Diona Browner, DDS;  Location: [place];  Service: Oral Surgery;  Laterality: N/A;   ESOPHAGOGASTRODUODENOSCOPY (EGD) WITH PROPOFOL N/A 11/15/2016   Procedure: ESOPHAGOGASTRODUODENOSCOPY (EGD) WITH PROPOFOL;  Surgeon: Clarene Essex, MD;  Location: [facility];  Service: Endoscopy;  Laterality: N/A;   INGUINAL HERNIA REPAIR Bilateral ~ 1996   NERVE, TENDON AND ARTERY REPAIR Right 09/23/2012   Procedure: I&D and Repair As Necessary/Right Hand and Palm;  Surgeon: Roseanne Kaufman, MD;  Location: [place];  Service: Orthopedics;  Laterality: Right;   TONSILLECTOMY      Social History   Socioeconomic History   Marital status: Single    Spouse name: Not on file   Number of children: Not on file   Years of education: Not on file   Highest education level: Not on file  Occupational History   Not on file  Social Needs   Financial resource strain: Not on file   Food insecurity    Worry: Not on file    Inability: Not on file   Transportation needs    Medical: Not on file    Non-medical: Not on file  Tobacco Use   Smoking status: Former Smoker    Packs/day: 0.25    Years: 4.00    Pack years: 1.00    Types: Cigarettes    Quit date: 10/02/2015    Years since quitting: 3.2   Smokeless tobacco: Never Used  Substance and Sexual Activity    Alcohol use: No    Alcohol/week: 0.0 standard drinks    Frequency: Never   Drug use: No   Sexual activity: Yes    Birth control/protection: None  Lifestyle   Physical activity    Days per week: Not on file    Minutes per session: Not on file   Stress: Not on file  Relationships   Social connections    Talks on phone: Not on file    Gets together: Not on file    Attends religious service: Not on file    Active member of club or organization: Not on file    Attends meetings of clubs or organizations: Not on file    Relationship status: Not on file   Intimate partner violence    Fear of current or ex partner: Not  on file    Emotionally abused: Not on file    Physically abused: Not on file    Forced sexual activity: Not on file  Other Topics Concern   Not on file  Social History Narrative   Not on file     Allergies  Allergen Reactions   Cheese Shortness Of Breath   Chocolate Shortness Of Breath   Ibuprofen Hives and Shortness Of Breath    Children's ibuprofen   Ivp Dye [Iodinated Diagnostic Agents] Hives, Shortness Of Breath and Itching   Latex Anaphylaxis, Swelling and Other (See Comments)    Reaction:  Localized swelling    Orange Juice [Orange Oil] Shortness Of Breath   Other Hives and Other (See Comments)    Pt states that she is allergic to all steroids except IV solu-medrol.     Peach [Prunus Persica] Anaphylaxis   Peanuts [Peanut Oil] Anaphylaxis   Pear Anaphylaxis   Prednisone Hives   Raspberry Anaphylaxis   Tylenol [Acetaminophen] Hives, Shortness Of Breath and Other (See Comments)    Pt states that this is only with the liquid form   Amoxicillin Hives and Other (See Comments)    Has patient had a PCN reaction causing immediate rash, facial/tongue/throat swelling, SOB or lightheadedness with hypotension: No Has patient had a PCN reaction causing severe rash involving mucus membranes or skin necrosis: No Has patient had a PCN reaction  that required hospitalization: No Has patient had a PCN reaction occurring within the last 10 years: No If all of the above answers are "NO", then may proceed with Cephalosporin use.   Apricot Flavor Hives   Doxycycline Hives   Erythromycin Hives   Milk Of Magnesia [Magnesium Hydroxide] Hives and Itching   Penicillins Hives and Other (See Comments)    Has patient had a PCN reaction causing immediate rash, facial/tongue/throat swelling, SOB or lightheadedness with hypotension: No Has patient had a PCN reaction causing severe rash involving mucus membranes or skin necrosis: No Has patient had a PCN reaction that required hospitalization: No Has patient had a PCN reaction occurring within the last 10 years: No If all of the above answers are "NO", then may proceed with Cephalosporin use.     Immunization History  Administered Date(s) Administered   Influenza,inj,Quad PF,6+ Mos 05/13/2014, 10/28/2014, 10/10/2015, 12/02/2017   Pneumococcal Polysaccharide-23 11/11/2014   Tdap 09/23/2012, 09/02/2014    Outpatient Medications Prior to Visit  Medication Sig Dispense Refill   acetaminophen (TYLENOL) 500 MG tablet Take 500 mg by mouth every 6 (six) hours as needed.     albuterol (PROVENTIL HFA;VENTOLIN HFA) 108 (90 Base) MCG/ACT inhaler Inhale 2 puffs into the lungs every 4 (four) hours as needed for wheezing or shortness of breath. 1 Inhaler 3   Ascorbic Acid (VITAMIN C) 100 MG CHEW Chew 1 tablet (100 mg total) by mouth daily. 90 each 2   cetirizine (ZYRTEC) 10 MG tablet Take 1 tablet (10 mg total) by mouth daily. 15 tablet 0   docusate sodium (COLACE) 50 MG capsule Take 1 capsule (50 mg total) by mouth 2 (two) times daily as needed for mild constipation. 60 capsule 2   dornase alpha (PULMOZYME) 1 MG/ML nebulizer solution Take 2.5 mLs (2.5 mg total) by nebulization 2 (two) times daily. 75 mL 3   feeding supplement, ENSURE ENLIVE, (ENSURE ENLIVE) LIQD Take 237 mLs by mouth 2 (two)  times daily between meals. 474 mL 3 times a day, provide 3-week supply 60 Bottle 3   Fluticasone-Salmeterol (ADVAIR)  500-50 MCG/DOSE AEPB Inhale 1 puff into the lungs 2 (two) times daily. 1 each 5   Ibuprofen 200 MG CAPS Take 400 mg by mouth every 6 (six) hours as needed.     levETIRAcetam (KEPPRA) 750 MG tablet Take 1 tablet (750 mg total) by mouth 2 (two) times daily. 60 tablet 2   montelukast (SINGULAIR) 10 MG tablet Take 10 mg by mouth at bedtime.     Multiple Vitamin (MULTIVITAMIN WITH MINERALS) TABS tablet Take 2 tablets by mouth daily.     SUMAtriptan (IMITREX) 50 MG tablet Take 1 tablet (50 mg total) by mouth daily. May take one more tablet two hours after the first. No more than two tablets per day. 20 tablet 1   Amylase-Lipase-Protease (CREON 20 PO) Take 2-7 capsules by mouth See admin instructions. Take 2-7 capsules by mouth with each meal and snack (dose based on amount of food intake)     Lumacaftor-Ivacaftor 150-188 MG PACK Take 1 each by nebulization 2 (two) times daily. 56 each 5   tobramycin, PF, (TOBI) 300 MG/5ML nebulizer solution Take 5 mLs (300 mg total) by nebulization every 12 (twelve) hours. 280 mL 3   topiramate (TOPAMAX) 50 MG tablet Take 1 tablet (50 mg total) by mouth at bedtime. 30 tablet 3   No facility-administered medications prior to visit.     Review of Systems  Constitutional: Negative for chills, fever and weight loss.  HENT: Negative.   Eyes: Negative.   Respiratory: Negative for cough, sputum production, shortness of breath and wheezing.   Cardiovascular: Negative for chest pain and leg swelling.  Gastrointestinal: Positive for abdominal pain. Negative for nausea and vomiting.  Genitourinary: Negative.   Musculoskeletal: Negative.   Skin: Negative for rash.  Neurological: Negative.   Psychiatric/Behavioral: Negative.      Objective:   Vitals:   12/23/18 1507  BP: 116/62  Pulse: 89  Temp: 98.3 F (36.8 C)  TempSrc: Temporal  SpO2:  98%  Weight: 168 lb 6.4 oz (76.4 kg)  Height: 5\' 6"  (1.676 m)   98% on   RA BMI Readings from Last 3 Encounters:  12/23/18 27.18 kg/m  12/22/18 26.63 kg/m  12/09/18 28.94 kg/m   Wt Readings from Last 3 Encounters:  12/23/18 168 lb 6.4 oz (76.4 kg)  12/22/18 165 lb (74.8 kg)  12/09/18 168 lb 9.6 oz (76.5 kg)    Physical Exam Vitals signs reviewed.  Constitutional:      General: She is not in acute distress.    Appearance: Normal appearance. She is not ill-appearing.  HENT:     Head: Normocephalic and atraumatic.     Nose:     Comments: Deferred due to masking requirement.    Mouth/Throat:     Comments: Deferred due to masking requirement. Eyes:     General: No scleral icterus. Neck:     Musculoskeletal: Neck supple. No neck rigidity.  Cardiovascular:     Rate and Rhythm: Normal rate and regular rhythm.     Heart sounds: No murmur.  Pulmonary:     Comments: Sitting comfortably on room air, no conversational dyspnea.  No witnessed coughing.  Clear to auscultation bilaterally. Abdominal:     General: There is distension.     Palpations: Abdomen is soft.     Tenderness: There is abdominal tenderness.  Musculoskeletal:        General: No swelling or deformity.  Lymphadenopathy:     Cervical: No cervical adenopathy.  Skin:    General:  Skin is warm and dry.     Findings: No rash.  Neurological:     General: No focal deficit present.     Mental Status: She is alert.     Motor: No weakness.     Coordination: Coordination normal.  Psychiatric:        Mood and Affect: Mood normal.        Behavior: Behavior normal.      CBC    Component Value Date/Time   WBC 13.2 (H) 12/22/2018 1356   WBC 9.5 12/04/2018 2046   RBC 4.18 12/22/2018 1356   HGB 9.9 (L) 12/22/2018 1356   HGB 11.9 08/20/2017 1144   HCT 32.2 (L) 12/22/2018 1356   HCT 38.0 08/20/2017 1144   PLT 257 12/22/2018 1356   PLT 294 08/20/2017 1144   MCV 77.0 (L) 12/22/2018 1356   MCV 87 08/20/2017 1144    MCH 23.7 (L) 12/22/2018 1356   MCHC 30.7 12/22/2018 1356   RDW 16.1 (H) 12/22/2018 1356   RDW 14.3 08/20/2017 1144   LYMPHSABS 2.5 12/22/2018 1356   LYMPHSABS 3.1 08/20/2017 1144   MONOABS 0.6 12/22/2018 1356   EOSABS 0.1 12/22/2018 1356   EOSABS 0.3 08/20/2017 1144   BASOSABS 0.0 12/22/2018 1356   BASOSABS 0.0 08/20/2017 1144   eos 300 on 12/01/2017  CHEMISTRY Recent Labs  Lab 12/22/18 1356  NA 139  K 3.2*  CL 106  CO2 23  GLUCOSE 76  BUN <4*  CREATININE 0.76  CALCIUM 8.5*   Estimated Creatinine Clearance: 110.2 mL/min (by C-G formula based on SCr of 0.76 mg/dL).  No A1cs on file  IgE 75 (12/01/2017), 32 (05/04/2017)  ABG 10/02/2014 on room air: 7.47/ 25.1/ 125/ 18, 98.9% saturation  ABG 04/19/2015 on room air: 7.40/ 30.5/ 124/ 18.4, 98.5% saturation 2019 blood gases were performed on an unknown FiO2  Covid negative 12/22/2018  Respiratory cultures: 04/21/2015 sputum-normal flora 06/03/2017-coag negative staph, normal oral flora  Chest Imaging- films reviewed: CXR, 2 view 10/03/2018- Normal CXR.  No obvious bronchiectasis, cavities, or scarring.  CT neck 07/17/2017, lung films reviewed-upper lungs only available for review.  Mild airway thickening, but no bronchiectasis.  Normal tapering of the airways.  Normal lung parenchyma.  Right TMJ anterior subluxation.  CT maxillofacial 07/25/2017-retention cyst in the left frontal sinus.  No evidence of sinusitis.  CT abdomen pelvis 07/09/2017, lung films reviewed- most of the lingula, RML, and bilateral lower lobes able to be seen.  Normal airways and lung parenchyma.  CTA chest 06/03/2017 performed at [facility], report only available- right upper extremity PICC line.  Mild paraseptal emphysema.  No focal airspace disease.  No PE.  Pulmonary Functions Testing Results: No flowsheet data found.      Assessment & Plan:     ICD-10-CM   1. Encounter for pre-operative respiratory clearance  Z01.811   2. Mild  persistent allergic asthma  J45.30   3. Allergic rhinitis, unspecified seasonality, unspecified trigger  J30.9      Mild persistent asthma-very well controlled.  ACT 25- perfect control. -Continue high-dose Advair twice daily.  She should use this morning of surgery. -Continue albuterol as needed -Continue Singulair daily -Continue allergic rhinosinusitis management -While admitted to the hospital, if she was unable to use a LABA/ ICS inhaler (Breo is on formulary), can use budesonide nebulizers twice daily and albuterol or DuoNebs as needed. -Reports up-to-date on seasonal flu vaccine -Continue COVID-19 precautions- mask wearing, handwashing, social distancing.  Preop respiratory clearance- ARISCAT  score 42 (intermediate risk) assuming upper abdominal incision and 2 to 3-hour operative time with preoperative anemia. -Patient is optimized for surgery currently.  She should use her Advair the morning of surgery.  Albuterol can be used as needed.  Pulmonology can be consulted while she is admitted if there are any concerns about her asthma. -I discussed the importance of good pulmonary hygiene, including taking deep breaths and coughing postoperatively to prevent pneumonia.  She also needs to be out of bed and walking as soon as safely possible. -DVT prophylaxis as soon as reasonably possible after surgery. She has had a provoked DVT in the past while pregnant.  Allergic rhinosinusitis -Continue cetirizine and Singulair -If uncontrolled can add intranasal steroids once daily.  RTC in 3 months.    Current Outpatient Medications:    acetaminophen (TYLENOL) 500 MG tablet, Take 500 mg by mouth every 6 (six) hours as needed., Disp: , Rfl:    albuterol (PROVENTIL HFA;VENTOLIN HFA) 108 (90 Base) MCG/ACT inhaler, Inhale 2 puffs into the lungs every 4 (four) hours as needed for wheezing or shortness of breath., Disp: 1 Inhaler, Rfl: 3   Ascorbic Acid (VITAMIN C) 100 MG CHEW, Chew 1 tablet (100  mg total) by mouth daily., Disp: 90 each, Rfl: 2   cetirizine (ZYRTEC) 10 MG tablet, Take 1 tablet (10 mg total) by mouth daily., Disp: 15 tablet, Rfl: 0   docusate sodium (COLACE) 50 MG capsule, Take 1 capsule (50 mg total) by mouth 2 (two) times daily as needed for mild constipation., Disp: 60 capsule, Rfl: 2   dornase alpha (PULMOZYME) 1 MG/ML nebulizer solution, Take 2.5 mLs (2.5 mg total) by nebulization 2 (two) times daily., Disp: 75 mL, Rfl: 3   feeding supplement, ENSURE ENLIVE, (ENSURE ENLIVE) LIQD, Take 237 mLs by mouth 2 (two) times daily between meals. 474 mL 3 times a day, provide 3-week supply, Disp: 60 Bottle, Rfl: 3   Fluticasone-Salmeterol (ADVAIR) 500-50 MCG/DOSE AEPB, Inhale 1 puff into the lungs 2 (two) times daily., Disp: 1 each, Rfl: 5   Ibuprofen 200 MG CAPS, Take 400 mg by mouth every 6 (six) hours as needed., Disp: , Rfl:    levETIRAcetam (KEPPRA) 750 MG tablet, Take 1 tablet (750 mg total) by mouth 2 (two) times daily., Disp: 60 tablet, Rfl: 2   montelukast (SINGULAIR) 10 MG tablet, Take 10 mg by mouth at bedtime., Disp: , Rfl:    Multiple Vitamin (MULTIVITAMIN WITH MINERALS) TABS tablet, Take 2 tablets by mouth daily., Disp: , Rfl:    SUMAtriptan (IMITREX) 50 MG tablet, Take 1 tablet (50 mg total) by mouth daily. May take one more tablet two hours after the first. No more than two tablets per day., Disp: 20 tablet, Rfl: 1   topiramate (TOPAMAX) 50 MG tablet, Take 1 tablet (50 mg total) by mouth at bedtime., Disp: 30 tablet, Rfl: 3   Julian Hy, DO [facility] 12/23/2018 4:02 PM

## 2018-12-23 NOTE — Telephone Encounter (Signed)
Tonya made the appointment for pt. I wil advise her to bring paperwork with her or have the office fax it over.

## 2018-12-23 NOTE — Telephone Encounter (Signed)
Contacted by preop testing with updated preferred patient number which is [phone].  Called the patient and reviewed why we have been trying to contact her yesterday.  After chart review by anesthesia, they feel that given her significant pulmonary and neurologic comorbidities, that she needs preoperative clearance and possible testing.  We have reached out to pulmonology in hopes that she can be seen today or later this week.  She also has an appointment with her primary care doctor scheduled for Monday.  We will tentatively leave her on the OR schedule for Thursday afternoon although I discussed with her that we may have to delay surgery until next Wednesday.  I have encouraged her to do her best to stay hydrated and to drink ensures for some caloric and nutritional support as she is able to.  Patient voiced understanding of the plan and states she is able to make an appointment today or tomorrow.  Jeral Pinch MD Gynecologic Oncology

## 2018-12-23 NOTE — Telephone Encounter (Signed)
Attempted to call patient to inform her that her surgery has been rescheduled for Thurs, Dec 10 pending pulm clearance.  No answer and unable to leave a message.  Also planned to inform patient of potential appointment with [place] [facility] if they are able to see her to obtain clearance for surgery from a pulmonology standpoint.  Message has been left with  pulm with request for appointment for pulm clearance if possible.    Will continue to attempt to reach patient.

## 2018-12-23 NOTE — Patient Instructions (Addendum)
Thank you for visiting Dr. Carlis Abbott at [facility]. We recommend the following:  Stay on Advair twice daily and singulair once daily. Use your inhaler the morning of surgery.   Return in about 3 months (around 03/23/2019).    Please do your part to reduce the spread of COVID-19.

## 2018-12-23 NOTE — Telephone Encounter (Signed)
Patient returned call and was given the appt time and location of the appt for today

## 2018-12-23 NOTE — Telephone Encounter (Signed)
I called Jasmine [name] but there was no answer. I left a message for her to call us back.  Pt hasnt been seen since 2016 with [facility]. She would need a consult appointment and permission from the provider to have a surgical clearance. The doctor may need tests performed before signing off on the clearance which may not be immediate. I will await a return call from [facility].   In the meanwhile, it looks like Dr. Carlis Abbott has openings today. Dr. Carlis Abbott would you be willing to see pt today? Please advise.

## 2018-12-23 NOTE — Progress Notes (Signed)
The patient was seen for COVID-19 testing. A sample was collected and taken to the [facility] lab.  Sandi Mealy, MHS, PA-C Physician Assistant

## 2018-12-24 ENCOUNTER — Telehealth: Payer: Self-pay | Admitting: *Deleted

## 2018-12-24 NOTE — Anesthesia Preprocedure Evaluation (Addendum)
Anesthesia Evaluation  Patient identified by MRN, date of birth, ID band Patient awake    Reviewed: Allergy & Precautions, NPO status , Patient's Chart, lab work & pertinent test results  Airway Mallampati: II  TM Distance: >3 FB Neck ROM: Full    Dental  (+) Teeth Intact, Dental Advisory Given   Pulmonary former smoker,    Pulmonary exam normal        Cardiovascular  Rhythm:Regular Rate:Normal     Neuro/Psych    GI/Hepatic   Endo/Other    Renal/GU      Musculoskeletal   Abdominal   Peds  Hematology   Anesthesia Other Findings   Reproductive/Obstetrics                           Anesthesia Physical Anesthesia Plan  ASA: III  Anesthesia Plan: General   Post-op Pain Management:    Induction: Intravenous  PONV Risk Score and Plan: Ondansetron  Airway Management Planned: Oral ETT  Additional Equipment:   Intra-op Plan:   Post-operative Plan: Extubation in OR  Informed Consent: I have reviewed the patients History and Physical, chart, labs and discussed the procedure including the risks, benefits and alternatives for the proposed anesthesia with the patient or authorized representative who has indicated his/her understanding and acceptance.     Dental advisory given  Plan Discussed with: CRNA and Anesthesiologist  Anesthesia Plan Comments: (See PAT note 12/22/2018, Konrad Felix, PA-C  H/O severe asthma, seizures, migraines, possible cystic fibrosis (felt to be unlikely), anemia. Plan GA with oral ETT,   Roberts Gaudy)       Anesthesia Quick Evaluation

## 2018-12-24 NOTE — Telephone Encounter (Signed)
Left message regarding upcoming scheduled surgery along with office clinic phone number

## 2018-12-24 NOTE — Telephone Encounter (Signed)
Consult was scheduled for yesterday. Pt came for her appointment. Message will be closed.

## 2018-12-25 ENCOUNTER — Other Ambulatory Visit: Payer: Self-pay

## 2018-12-25 ENCOUNTER — Inpatient Hospital Stay (HOSPITAL_COMMUNITY): Payer: Medicaid Other | Admitting: Physician Assistant

## 2018-12-25 ENCOUNTER — Encounter (HOSPITAL_COMMUNITY)
Admission: RE | Disposition: A | Discharge status: Home / Self Care | Payer: Self-pay | Source: Home / Self Care | Attending: Gynecologic Oncology

## 2018-12-25 ENCOUNTER — Inpatient Hospital Stay (HOSPITAL_COMMUNITY)
Admission: RE | Admit: 2018-12-25 | Discharge: 2018-12-28 | DRG: 737 | Disposition: A | Discharge status: Home / Self Care | Payer: Medicaid Other | Attending: Gynecologic Oncology | Admitting: Gynecologic Oncology

## 2018-12-25 ENCOUNTER — Encounter (HOSPITAL_COMMUNITY): Payer: Self-pay | Admitting: Gynecologic Oncology

## 2018-12-25 DIAGNOSIS — R6881 Early satiety: Secondary | ICD-10-CM | POA: Diagnosis present

## 2018-12-25 DIAGNOSIS — Z20828 Contact with and (suspected) exposure to other viral communicable diseases: Secondary | ICD-10-CM | POA: Diagnosis not present

## 2018-12-25 DIAGNOSIS — R5383 Other fatigue: Secondary | ICD-10-CM | POA: Diagnosis present

## 2018-12-25 DIAGNOSIS — Z833 Family history of diabetes mellitus: Secondary | ICD-10-CM

## 2018-12-25 DIAGNOSIS — K55069 Acute infarction of intestine, part and extent unspecified: Secondary | ICD-10-CM | POA: Diagnosis not present

## 2018-12-25 DIAGNOSIS — Z91018 Allergy to other foods: Secondary | ICD-10-CM | POA: Diagnosis not present

## 2018-12-25 DIAGNOSIS — Z888 Allergy status to other drugs, medicaments and biological substances status: Secondary | ICD-10-CM

## 2018-12-25 DIAGNOSIS — Z91040 Latex allergy status: Secondary | ICD-10-CM

## 2018-12-25 DIAGNOSIS — R197 Diarrhea, unspecified: Secondary | ICD-10-CM | POA: Diagnosis present

## 2018-12-25 DIAGNOSIS — Z881 Allergy status to other antibiotic agents status: Secondary | ICD-10-CM | POA: Diagnosis not present

## 2018-12-25 DIAGNOSIS — R1900 Intra-abdominal and pelvic swelling, mass and lump, unspecified site: Secondary | ICD-10-CM | POA: Diagnosis not present

## 2018-12-25 DIAGNOSIS — Z8719 Personal history of other diseases of the digestive system: Secondary | ICD-10-CM | POA: Diagnosis not present

## 2018-12-25 DIAGNOSIS — J9811 Atelectasis: Secondary | ICD-10-CM | POA: Diagnosis present

## 2018-12-25 DIAGNOSIS — G4730 Sleep apnea, unspecified: Secondary | ICD-10-CM | POA: Diagnosis present

## 2018-12-25 DIAGNOSIS — J4530 Mild persistent asthma, uncomplicated: Secondary | ICD-10-CM | POA: Diagnosis present

## 2018-12-25 DIAGNOSIS — Z91041 Radiographic dye allergy status: Secondary | ICD-10-CM | POA: Diagnosis not present

## 2018-12-25 DIAGNOSIS — C562 Malignant neoplasm of left ovary: Secondary | ICD-10-CM | POA: Diagnosis not present

## 2018-12-25 DIAGNOSIS — E860 Dehydration: Secondary | ICD-10-CM | POA: Diagnosis present

## 2018-12-25 DIAGNOSIS — D72829 Elevated white blood cell count, unspecified: Secondary | ICD-10-CM | POA: Diagnosis not present

## 2018-12-25 DIAGNOSIS — D573 Sickle-cell trait: Secondary | ICD-10-CM | POA: Diagnosis present

## 2018-12-25 DIAGNOSIS — D3912 Neoplasm of uncertain behavior of left ovary: Secondary | ICD-10-CM | POA: Diagnosis not present

## 2018-12-25 DIAGNOSIS — J45901 Unspecified asthma with (acute) exacerbation: Secondary | ICD-10-CM | POA: Diagnosis not present

## 2018-12-25 DIAGNOSIS — G40909 Epilepsy, unspecified, not intractable, without status epilepticus: Secondary | ICD-10-CM | POA: Diagnosis present

## 2018-12-25 DIAGNOSIS — Z87891 Personal history of nicotine dependence: Secondary | ICD-10-CM

## 2018-12-25 DIAGNOSIS — Z807 Family history of other malignant neoplasms of lymphoid, hematopoietic and related tissues: Secondary | ICD-10-CM

## 2018-12-25 DIAGNOSIS — Z9109 Other allergy status, other than to drugs and biological substances: Secondary | ICD-10-CM

## 2018-12-25 DIAGNOSIS — Z86718 Personal history of other venous thrombosis and embolism: Secondary | ICD-10-CM | POA: Diagnosis not present

## 2018-12-25 DIAGNOSIS — Z87440 Personal history of urinary (tract) infections: Secondary | ICD-10-CM

## 2018-12-25 DIAGNOSIS — D649 Anemia, unspecified: Secondary | ICD-10-CM | POA: Diagnosis not present

## 2018-12-25 DIAGNOSIS — N939 Abnormal uterine and vaginal bleeding, unspecified: Secondary | ICD-10-CM | POA: Diagnosis not present

## 2018-12-25 DIAGNOSIS — Z9981 Dependence on supplemental oxygen: Secondary | ICD-10-CM

## 2018-12-25 DIAGNOSIS — Z880 Allergy status to penicillin: Secondary | ICD-10-CM | POA: Diagnosis not present

## 2018-12-25 DIAGNOSIS — K219 Gastro-esophageal reflux disease without esophagitis: Secondary | ICD-10-CM | POA: Diagnosis present

## 2018-12-25 DIAGNOSIS — T380X5A Adverse effect of glucocorticoids and synthetic analogues, initial encounter: Secondary | ICD-10-CM | POA: Diagnosis not present

## 2018-12-25 DIAGNOSIS — R109 Unspecified abdominal pain: Secondary | ICD-10-CM | POA: Diagnosis not present

## 2018-12-25 DIAGNOSIS — N9489 Other specified conditions associated with female genital organs and menstrual cycle: Secondary | ICD-10-CM | POA: Diagnosis not present

## 2018-12-25 DIAGNOSIS — G8918 Other acute postprocedural pain: Secondary | ICD-10-CM | POA: Diagnosis not present

## 2018-12-25 DIAGNOSIS — E876 Hypokalemia: Secondary | ICD-10-CM

## 2018-12-25 DIAGNOSIS — Z825 Family history of asthma and other chronic lower respiratory diseases: Secondary | ICD-10-CM

## 2018-12-25 DIAGNOSIS — Z8249 Family history of ischemic heart disease and other diseases of the circulatory system: Secondary | ICD-10-CM

## 2018-12-25 DIAGNOSIS — Z832 Family history of diseases of the blood and blood-forming organs and certain disorders involving the immune mechanism: Secondary | ICD-10-CM

## 2018-12-25 DIAGNOSIS — K660 Peritoneal adhesions (postprocedural) (postinfection): Secondary | ICD-10-CM | POA: Diagnosis present

## 2018-12-25 DIAGNOSIS — Z8049 Family history of malignant neoplasm of other genital organs: Secondary | ICD-10-CM

## 2018-12-25 DIAGNOSIS — D4959 Neoplasm of unspecified behavior of other genitourinary organ: Secondary | ICD-10-CM | POA: Diagnosis not present

## 2018-12-25 HISTORY — PX: OMENTECTOMY: SHX5985

## 2018-12-25 HISTORY — PX: SALPINGOOPHORECTOMY: SHX82

## 2018-12-25 LAB — CBC
HCT: 33.1 % — ABNORMAL LOW (ref 36.0–46.0)
Hemoglobin: 10.1 g/dL — ABNORMAL LOW (ref 12.0–15.0)
MCH: 23.8 pg — ABNORMAL LOW (ref 26.0–34.0)
MCHC: 30.5 g/dL (ref 30.0–36.0)
MCV: 78.1 fL — ABNORMAL LOW (ref 80.0–100.0)
Platelets: 296 10*3/uL (ref 150–400)
RBC: 4.24 MIL/uL (ref 3.87–5.11)
RDW: 16.4 % — ABNORMAL HIGH (ref 11.5–15.5)
WBC: 10.7 10*3/uL — ABNORMAL HIGH (ref 4.0–10.5)
nRBC: 0 % (ref 0.0–0.2)

## 2018-12-25 LAB — BASIC METABOLIC PANEL
Anion gap: 8 (ref 5–15)
BUN: 5 mg/dL — ABNORMAL LOW (ref 6–20)
CO2: 24 mmol/L (ref 22–32)
Calcium: 9 mg/dL (ref 8.9–10.3)
Chloride: 107 mmol/L (ref 98–111)
Creatinine, Ser: 0.62 mg/dL (ref 0.44–1.00)
GFR calc Af Amer: >60 mL/min (ref >60)
GFR calc non Af Amer: >60 mL/min (ref >60)
Glucose, Bld: 82 mg/dL (ref 70–99)
Potassium: 3.1 mmol/L — ABNORMAL LOW (ref 3.5–5.1)
Sodium: 139 mmol/L (ref 135–145)

## 2018-12-25 LAB — TYPE AND SCREEN
ABO/RH(D): B POS
Antibody Screen: NEGATIVE

## 2018-12-25 LAB — PREGNANCY, URINE: Preg Test, Ur: NEGATIVE

## 2018-12-25 SURGERY — SALPINGO-OOPHORECTOMY, OPEN
Anesthesia: General

## 2018-12-25 MED ORDER — MOMETASONE FURO-FORMOTEROL FUM 200-5 MCG/ACT IN AERO
2.0000 | INHALATION_SPRAY | Freq: Two times a day (BID) | RESPIRATORY_TRACT | Status: DC
  Administered 2018-12-25 – 2018-12-27 (×5): 2 via RESPIRATORY_TRACT
  Filled 2018-12-25: qty 8.8

## 2018-12-25 MED ORDER — ONDANSETRON HCL 4 MG/2ML IJ SOLN: 4.0000 mg | Freq: Once | INTRAMUSCULAR | Status: DC | PRN

## 2018-12-25 MED ORDER — OXYCODONE HCL 5 MG PO TABS: 5.0000 mg | ORAL_TABLET | Freq: Once | ORAL | Status: DC | PRN

## 2018-12-25 MED ORDER — ALBUTEROL SULFATE HFA 108 (90 BASE) MCG/ACT IN AERS
INHALATION_SPRAY | RESPIRATORY_TRACT | Status: DC | PRN
  Administered 2018-12-25 (×2): 2 via RESPIRATORY_TRACT

## 2018-12-25 MED ORDER — ONDANSETRON HCL 4 MG/2ML IJ SOLN
INTRAMUSCULAR | Status: AC
  Filled 2018-12-25: qty 2

## 2018-12-25 MED ORDER — ACETAMINOPHEN 500 MG PO TABS
1000.0000 mg | ORAL_TABLET | ORAL | Status: AC
  Administered 2018-12-25: 1000 mg via ORAL
  Filled 2018-12-25: qty 2

## 2018-12-25 MED ORDER — LIDOCAINE 2% (20 MG/ML) 5 ML SYRINGE
INTRAMUSCULAR | Status: DC | PRN
  Administered 2018-12-25: 40 mg via INTRAVENOUS

## 2018-12-25 MED ORDER — GABAPENTIN 300 MG PO CAPS
ORAL_CAPSULE | ORAL | Status: AC
  Administered 2018-12-25: 300 mg via ORAL
  Filled 2018-12-25: qty 1

## 2018-12-25 MED ORDER — DEXMEDETOMIDINE HCL IN NACL 200 MCG/50ML IV SOLN
INTRAVENOUS | Status: DC | PRN
  Administered 2018-12-25 (×3): 10 ug via INTRAVENOUS

## 2018-12-25 MED ORDER — OXYCODONE HCL 5 MG PO TABS
5.0000 mg | ORAL_TABLET | ORAL | Status: DC | PRN
  Administered 2018-12-25 – 2018-12-27 (×8): 5 mg via ORAL
  Filled 2018-12-25 (×10): qty 1

## 2018-12-25 MED ORDER — LIDOCAINE 2% (20 MG/ML) 5 ML SYRINGE
INTRAMUSCULAR | Status: AC
  Filled 2018-12-25: qty 5

## 2018-12-25 MED ORDER — NON FORMULARY: 1.0000 [IU] | Freq: Three times a day (TID) | Status: DC

## 2018-12-25 MED ORDER — ONDANSETRON HCL 4 MG PO TABS
4.0000 mg | ORAL_TABLET | Freq: Four times a day (QID) | ORAL | Status: DC | PRN
  Administered 2018-12-25: 4 mg via ORAL

## 2018-12-25 MED ORDER — ENOXAPARIN SODIUM 40 MG/0.4ML ~~LOC~~ SOLN
40.0000 mg | SUBCUTANEOUS | Status: DC
  Administered 2018-12-26 – 2018-12-28 (×3): 40 mg via SUBCUTANEOUS
  Filled 2018-12-25 (×3): qty 0.4

## 2018-12-25 MED ORDER — OXYCODONE HCL 5 MG/5ML PO SOLN: 5.0000 mg | Freq: Once | ORAL | Status: DC | PRN

## 2018-12-25 MED ORDER — LACTATED RINGERS IV SOLN
INTRAVENOUS | Status: DC | PRN
  Administered 2018-12-25: 13:00:00 via INTRAVENOUS

## 2018-12-25 MED ORDER — SCOPOLAMINE 1 MG/3DAYS TD PT72
1.0000 | MEDICATED_PATCH | TRANSDERMAL | Status: DC
  Administered 2018-12-25: 1.5 mg via TRANSDERMAL
  Filled 2018-12-25: qty 1

## 2018-12-25 MED ORDER — KCL IN DEXTROSE-NACL 20-5-0.45 MEQ/L-%-% IV SOLN
INTRAVENOUS | Status: DC
  Administered 2018-12-25 – 2018-12-26 (×3): via INTRAVENOUS
  Filled 2018-12-25 (×3): qty 1000

## 2018-12-25 MED ORDER — PROCHLORPERAZINE EDISYLATE 10 MG/2ML IJ SOLN: 10.0000 mg | Freq: Four times a day (QID) | INTRAMUSCULAR | Status: DC | PRN

## 2018-12-25 MED ORDER — METHYLPREDNISOLONE SODIUM SUCC 40 MG IJ SOLR
INTRAMUSCULAR | Status: AC
  Filled 2018-12-25: qty 1

## 2018-12-25 MED ORDER — MONTELUKAST SODIUM 10 MG PO TABS
10.0000 mg | ORAL_TABLET | Freq: Every day | ORAL | Status: DC
  Administered 2018-12-25 – 2018-12-27 (×3): 10 mg via ORAL
  Filled 2018-12-25 (×3): qty 1

## 2018-12-25 MED ORDER — KETAMINE HCL 10 MG/ML IJ SOLN
INTRAMUSCULAR | Status: DC | PRN
  Administered 2018-12-25: 30 mg via INTRAVENOUS

## 2018-12-25 MED ORDER — GABAPENTIN 300 MG PO CAPS
300.0000 mg | ORAL_CAPSULE | ORAL | Status: AC
  Filled 2018-12-25: qty 1

## 2018-12-25 MED ORDER — HYDROMORPHONE HCL 2 MG/ML IJ SOLN
INTRAMUSCULAR | Status: AC
  Filled 2018-12-25: qty 1

## 2018-12-25 MED ORDER — ROCURONIUM BROMIDE 10 MG/ML (PF) SYRINGE
PREFILLED_SYRINGE | INTRAVENOUS | Status: DC | PRN
  Administered 2018-12-25: 50 mg via INTRAVENOUS
  Administered 2018-12-25: 20 mg via INTRAVENOUS

## 2018-12-25 MED ORDER — LEVETIRACETAM 500 MG PO TABS
750.0000 mg | ORAL_TABLET | Freq: Two times a day (BID) | ORAL | Status: DC
  Administered 2018-12-25 – 2018-12-28 (×6): 750 mg via ORAL
  Filled 2018-12-25 (×6): qty 1

## 2018-12-25 MED ORDER — FENTANYL CITRATE (PF) 250 MCG/5ML IJ SOLN
INTRAMUSCULAR | Status: AC
  Filled 2018-12-25: qty 5

## 2018-12-25 MED ORDER — FENTANYL CITRATE (PF) 100 MCG/2ML IJ SOLN: 25.0000 ug | INTRAMUSCULAR | Status: DC | PRN

## 2018-12-25 MED ORDER — DEXMEDETOMIDINE HCL IN NACL 200 MCG/50ML IV SOLN
INTRAVENOUS | Status: AC
  Filled 2018-12-25: qty 50

## 2018-12-25 MED ORDER — SUGAMMADEX SODIUM 200 MG/2ML IV SOLN
INTRAVENOUS | Status: DC | PRN
  Administered 2018-12-25: 150 mg via INTRAVENOUS

## 2018-12-25 MED ORDER — LACTATED RINGERS IV SOLN
INTRAVENOUS | Status: DC
  Administered 2018-12-25 (×2): via INTRAVENOUS

## 2018-12-25 MED ORDER — PROPOFOL 10 MG/ML IV BOLUS
INTRAVENOUS | Status: DC | PRN
  Administered 2018-12-25: 160 mg via INTRAVENOUS

## 2018-12-25 MED ORDER — FENTANYL CITRATE (PF) 250 MCG/5ML IJ SOLN
INTRAMUSCULAR | Status: DC | PRN
  Administered 2018-12-25 (×2): 50 ug via INTRAVENOUS
  Administered 2018-12-25: 100 ug via INTRAVENOUS
  Administered 2018-12-25: 50 ug via INTRAVENOUS

## 2018-12-25 MED ORDER — SODIUM CHLORIDE (PF) 0.9 % IJ SOLN
INTRAMUSCULAR | Status: DC | PRN
  Administered 2018-12-25: 20 mL

## 2018-12-25 MED ORDER — CHEWING GUM (ORBIT) SUGAR FREE
1.0000 | CHEWING_GUM | Freq: Three times a day (TID) | ORAL | Status: DC
  Administered 2018-12-26 – 2018-12-28 (×6): 1 via ORAL
  Filled 2018-12-25: qty 1

## 2018-12-25 MED ORDER — CLINDAMYCIN PHOSPHATE 900 MG/50ML IV SOLN
900.0000 mg | INTRAVENOUS | Status: AC
  Administered 2018-12-25: 900 mg via INTRAVENOUS
  Filled 2018-12-25: qty 50

## 2018-12-25 MED ORDER — HYDROMORPHONE HCL 1 MG/ML IJ SOLN
INTRAMUSCULAR | Status: DC | PRN
  Administered 2018-12-25 (×2): 1 mg via INTRAVENOUS

## 2018-12-25 MED ORDER — TRAMADOL HCL 50 MG PO TABS: 100.0000 mg | ORAL_TABLET | Freq: Four times a day (QID) | ORAL | Status: DC

## 2018-12-25 MED ORDER — HEPARIN SODIUM (PORCINE) 5000 UNIT/ML IJ SOLN
5000.0000 [IU] | INTRAMUSCULAR | Status: AC
  Administered 2018-12-25: 5000 [IU] via SUBCUTANEOUS
  Filled 2018-12-25: qty 1

## 2018-12-25 MED ORDER — PROPOFOL 10 MG/ML IV BOLUS
INTRAVENOUS | Status: AC
  Filled 2018-12-25: qty 20

## 2018-12-25 MED ORDER — ONDANSETRON HCL 4 MG/2ML IJ SOLN
INTRAMUSCULAR | Status: DC | PRN
  Administered 2018-12-25: 4 mg via INTRAVENOUS

## 2018-12-25 MED ORDER — METHYLPREDNISOLONE SODIUM SUCC 125 MG IJ SOLR
INTRAMUSCULAR | Status: DC | PRN
  Administered 2018-12-25: 40 mg via INTRAVENOUS

## 2018-12-25 MED ORDER — ALBUTEROL SULFATE (2.5 MG/3ML) 0.083% IN NEBU: 3.0000 mL | INHALATION_SOLUTION | RESPIRATORY_TRACT | Status: DC | PRN

## 2018-12-25 MED ORDER — SENNOSIDES-DOCUSATE SODIUM 8.6-50 MG PO TABS
2.0000 | ORAL_TABLET | Freq: Every day | ORAL | Status: DC
  Administered 2018-12-25 – 2018-12-27 (×3): 2 via ORAL
  Filled 2018-12-25 (×3): qty 2

## 2018-12-25 MED ORDER — BUPIVACAINE LIPOSOME 1.3 % IJ SUSP
20.0000 mL | Freq: Once | INTRAMUSCULAR | Status: AC
  Administered 2018-12-25: 20 mL
  Filled 2018-12-25: qty 20

## 2018-12-25 MED ORDER — LIDOCAINE 20MG/ML (2%) 15 ML SYRINGE OPTIME
INTRAMUSCULAR | Status: DC | PRN
  Administered 2018-12-25: 1.5 mg/kg/h via INTRAVENOUS

## 2018-12-25 MED ORDER — SODIUM CHLORIDE (PF) 0.9 % IJ SOLN
INTRAMUSCULAR | Status: AC
  Filled 2018-12-25: qty 20

## 2018-12-25 MED ORDER — HYDROMORPHONE HCL 1 MG/ML IJ SOLN
0.5000 mg | INTRAMUSCULAR | Status: DC | PRN
  Administered 2018-12-25 – 2018-12-26 (×2): 0.5 mg via INTRAVENOUS
  Filled 2018-12-25 (×2): qty 0.5

## 2018-12-25 MED ORDER — FENTANYL CITRATE (PF) 100 MCG/2ML IJ SOLN
INTRAMUSCULAR | Status: AC
  Administered 2018-12-25: 50 ug via INTRAVENOUS
  Filled 2018-12-25: qty 2

## 2018-12-25 MED ORDER — ALBUTEROL SULFATE HFA 108 (90 BASE) MCG/ACT IN AERS
INHALATION_SPRAY | RESPIRATORY_TRACT | Status: AC
  Filled 2018-12-25: qty 6.7

## 2018-12-25 MED ORDER — BUPIVACAINE HCL (PF) 0.25 % IJ SOLN
INTRAMUSCULAR | Status: AC
  Filled 2018-12-25: qty 30

## 2018-12-25 MED ORDER — 0.9 % SODIUM CHLORIDE (POUR BTL) OPTIME
TOPICAL | Status: DC | PRN
  Administered 2018-12-25: 2000 mL

## 2018-12-25 MED ORDER — ONDANSETRON HCL 4 MG/2ML IJ SOLN
4.0000 mg | Freq: Four times a day (QID) | INTRAMUSCULAR | Status: DC | PRN
  Administered 2018-12-25 – 2018-12-28 (×2): 4 mg via INTRAVENOUS
  Filled 2018-12-25 (×2): qty 2

## 2018-12-25 MED ORDER — MIDAZOLAM HCL 2 MG/2ML IJ SOLN
INTRAMUSCULAR | Status: DC | PRN
  Administered 2018-12-25: 2 mg via INTRAVENOUS

## 2018-12-25 MED ORDER — MIDAZOLAM HCL 2 MG/2ML IJ SOLN
INTRAMUSCULAR | Status: AC
  Filled 2018-12-25: qty 2

## 2018-12-25 MED ORDER — BUPIVACAINE HCL 0.25 % IJ SOLN
INTRAMUSCULAR | Status: DC | PRN
  Administered 2018-12-25: 20 mL

## 2018-12-25 MED ORDER — ROCURONIUM BROMIDE 10 MG/ML (PF) SYRINGE
PREFILLED_SYRINGE | INTRAVENOUS | Status: AC
  Filled 2018-12-25: qty 10

## 2018-12-25 MED ORDER — PREGABALIN 75 MG PO CAPS
75.0000 mg | ORAL_CAPSULE | Freq: Two times a day (BID) | ORAL | Status: DC
  Administered 2018-12-26 – 2018-12-28 (×5): 75 mg via ORAL
  Filled 2018-12-25 (×5): qty 1

## 2018-12-25 MED ORDER — CIPROFLOXACIN IN D5W 400 MG/200ML IV SOLN
400.0000 mg | INTRAVENOUS | Status: AC
  Administered 2018-12-25: 400 mg via INTRAVENOUS
  Filled 2018-12-25: qty 200

## 2018-12-25 MED ORDER — ENSURE PRE-SURGERY PO LIQD
296.0000 mL | Freq: Once | ORAL | Status: DC
  Filled 2018-12-25: qty 296

## 2018-12-25 SURGICAL SUPPLY — 53 items
ATTRACTOMAT 16X20 MAGNETIC DRP (DRAPES) ×3 IMPLANT
BLADE EXTENDED COATED 6.5IN (ELECTRODE) ×3 IMPLANT
CELLS DAT CNTRL 66122 CELL SVR (MISCELLANEOUS) ×1 IMPLANT
CHLORAPREP W/TINT 26 (MISCELLANEOUS) ×3 IMPLANT
CLIP VESOCCLUDE LG 6/CT (CLIP) ×3 IMPLANT
CLIP VESOCCLUDE MED 6/CT (CLIP) ×3 IMPLANT
CLIP VESOCCLUDE MED LG 6/CT (CLIP) ×3 IMPLANT
CONT SPEC 4OZ CLIKSEAL STRL BL (MISCELLANEOUS) ×3 IMPLANT
COVER WAND RF STERILE (DRAPES) IMPLANT
DERMABOND ADVANCED (GAUZE/BANDAGES/DRESSINGS) ×2
DERMABOND ADVANCED .7 DNX12 (GAUZE/BANDAGES/DRESSINGS) ×1 IMPLANT
DRAPE WARM FLUID 44X44 (DRAPES) ×3 IMPLANT
DRSG OPSITE POSTOP 4X10 (GAUZE/BANDAGES/DRESSINGS) IMPLANT
DRSG OPSITE POSTOP 4X6 (GAUZE/BANDAGES/DRESSINGS) IMPLANT
DRSG OPSITE POSTOP 4X8 (GAUZE/BANDAGES/DRESSINGS) ×3 IMPLANT
ELECT REM PT RETURN 15FT ADLT (MISCELLANEOUS) ×3 IMPLANT
GAUZE 4X4 16PLY RFD (DISPOSABLE) IMPLANT
GLOVE BIO SURGEON STRL SZ 6 (GLOVE) ×6 IMPLANT
GLOVE BIO SURGEON STRL SZ 6.5 (GLOVE) ×4 IMPLANT
GLOVE BIO SURGEONS STRL SZ 6.5 (GLOVE) ×2
GOWN STRL REUS W/ TWL LRG LVL3 (GOWN DISPOSABLE) ×2 IMPLANT
GOWN STRL REUS W/TWL LRG LVL3 (GOWN DISPOSABLE) ×4
HEMOSTAT ARISTA ABSORB 3G PWDR (HEMOSTASIS) IMPLANT
KIT BASIN OR (CUSTOM PROCEDURE TRAY) ×3 IMPLANT
KIT TURNOVER KIT A (KITS) IMPLANT
LIGASURE IMPACT 36 18CM CVD LR (INSTRUMENTS) ×3 IMPLANT
LOOP VESSEL MAXI BLUE (MISCELLANEOUS) IMPLANT
NEEDLE HYPO 22GX1.5 SAFETY (NEEDLE) ×6 IMPLANT
NS IRRIG 1000ML POUR BTL (IV SOLUTION) ×6 IMPLANT
PACK GENERAL/GYN (CUSTOM PROCEDURE TRAY) ×3 IMPLANT
PENCIL SMOKE EVACUATOR (MISCELLANEOUS) ×3 IMPLANT
RETRACTOR WND ALEXIS 25 LRG (MISCELLANEOUS) IMPLANT
RTRCTR WOUND ALEXIS 18CM MED (MISCELLANEOUS) ×3
RTRCTR WOUND ALEXIS 25CM LRG (MISCELLANEOUS)
SHEET LAVH (DRAPES) ×3 IMPLANT
SLEEVE SUCTION CATH 165 (SLEEVE) ×3 IMPLANT
SPONGE LAP 18X18 RF (DISPOSABLE) IMPLANT
SURGIFLO W/THROMBIN 8M KIT (HEMOSTASIS) IMPLANT
SUT MNCRL AB 4-0 PS2 18 (SUTURE) ×6 IMPLANT
SUT PDS AB 1 TP1 96 (SUTURE) ×6 IMPLANT
SUT VIC AB 2-0 CT1 36 (SUTURE) ×9 IMPLANT
SUT VIC AB 2-0 CT2 27 (SUTURE) ×18 IMPLANT
SUT VIC AB 2-0 SH 27 (SUTURE)
SUT VIC AB 2-0 SH 27X BRD (SUTURE) IMPLANT
SUT VIC AB 3-0 CTX 36 (SUTURE) IMPLANT
SUT VIC AB 3-0 SH 18 (SUTURE) IMPLANT
SUT VIC AB 3-0 SH 27 (SUTURE) ×2
SUT VIC AB 3-0 SH 27X BRD (SUTURE) ×1 IMPLANT
SYR 30ML LL (SYRINGE) ×6 IMPLANT
TOWEL OR 17X26 10 PK STRL BLUE (TOWEL DISPOSABLE) ×3 IMPLANT
TOWEL OR NON WOVEN STRL DISP B (DISPOSABLE) ×3 IMPLANT
TRAY FOLEY MTR SLVR 16FR STAT (SET/KITS/TRAYS/PACK) ×3 IMPLANT
UNDERPAD 30X36 HEAVY ABSORB (UNDERPADS AND DIAPERS) ×3 IMPLANT

## 2018-12-25 NOTE — Transfer of Care (Signed)
Immediate Anesthesia Transfer of Care Note  Patient: Jasmine [name]  Procedure(s) Performed: OPEN UNILATERAL  SALPINGO OOPHORECTOMY WITH STAGING (N/A ) OMENTECTOMY (N/A )  Patient Location: PACU  Anesthesia Type:General  Level of Consciousness: drowsy, patient cooperative and responds to stimulation  Airway & Oxygen Therapy: Patient Spontanous Breathing and Patient connected to face mask oxygen  Post-op Assessment: Report given to RN and Post -op Vital signs reviewed and stable  Post vital signs: Reviewed and stable  Last Vitals:  Vitals Value Taken Time  BP 130/73 12/25/18 1545  Temp    Pulse 83 12/25/18 1549  Resp 17 12/25/18 1549  SpO2 100 % 12/25/18 1549  Vitals shown include unvalidated device data.  Last Pain: There were no vitals filed for this visit.       Complications: No apparent anesthesia complications

## 2018-12-25 NOTE — Brief Op Note (Signed)
12/25/2018  3:34 PM  PATIENT:  Jasmine [name]  28 y.o. female  PRE-OPERATIVE DIAGNOSIS:  PELVIC MASS  POST-OPERATIVE DIAGNOSIS:  PELVIC MASS  PROCEDURE:  Procedure(s): OPEN UNILATERAL  SALPINGO OOPHORECTOMY WITH STAGING (N/A) OMENTECTOMY (N/A)  Endometrial biopsy  SURGEON:  Surgeon(s) and Role:    Lafonda Mosses, MD - Primary    Lahoma Crocker, MD - Assisting  ANESTHESIA:   general  EBL:  100 mL   BLOOD ADMINISTERED:none  DRAINS: none   LOCAL MEDICATIONS USED:  BUPIVICAINE   SPECIMEN:  Left tube and ovary, peritoneal biopsies, small bowel mesentery nodule biopsies, diaphragm scraping, omentum  DISPOSITION OF SPECIMEN:  PATHOLOGY  COUNTS:  YES  TOURNIQUET:  * No tourniquets in log *  DICTATION: .Note written in EPIC  PLAN OF CARE: Admit to inpatient   PATIENT DISPOSITION:  PACU - hemodynamically stable.   Delay start of Pharmacological VTE agent (>24hrs) due to surgical blood loss or risk of bleeding: no

## 2018-12-25 NOTE — Anesthesia Procedure Notes (Signed)
Procedure Name: Intubation Date/Time: 12/25/2018 1:18 PM Performed by: Eben Burow, CRNA Pre-anesthesia Checklist: Patient identified, Emergency Drugs available, Suction available, Patient being monitored and Timeout performed Patient Re-evaluated:Patient Re-evaluated prior to induction Oxygen Delivery Method: Circle system utilized Preoxygenation: Pre-oxygenation with 100% oxygen Induction Type: IV induction Ventilation: Mask ventilation without difficulty Laryngoscope Size: Mac and 4 Grade View: Grade I Tube type: Oral Tube size: 7.0 mm Number of attempts: 1 Airway Equipment and Method: Stylet Placement Confirmation: ETT inserted through vocal cords under direct vision,  positive ETCO2 and breath sounds checked- equal and bilateral Secured at: 22 cm Tube secured with: Tape Dental Injury: Teeth and Oropharynx as per pre-operative assessment

## 2018-12-25 NOTE — Op Note (Signed)
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   Specimens: Left tube and ovary, small bowel mesenteric nodules, peritoneal biopsies, and omentum.   Estimated Blood Loss: 100 mL. IV Fluids: 2000 mL.  Urine Output: 0000000 mL  Complications: None apparent.   Operative Findings: On exam, large mass filling most of the abdomen, no nodularity.  On intra-abdominal entry, no ascites noted.  Thin omentum adherent to multiple places along the anterior abdominal wall as well as to the right aspect of the uterus and broad ligament.  Uterus adherent to the sidewall and anterior peritoneum.  Drainage of large cystic portion of the mass performed in a controlled manner with clear yellow-tinged fluid.  Attempt at drainage of a second cystic-appearing area of the mass resulted in a small amount of spillage of clear mucinous liquid.  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  Several 1-3 mm nodules, 1 in the posterior cul-de-sac and 2 along the small bowel mesentery, biopsied.  Liver edge and diaphragm smooth.  This represented an optimal cytoreduction with no gross  visible disease remaining.   Procedure: After informed consent was confirmed, the patient was taken to the operating room where general anesthesia was obtained without difficulty. She was prepped and draped in the normal sterile fashion in the dorsal lithotomy position in padded Allen stirrups with good attention paid to support of the lower back and lower extremities. Position was adjusted for appropriate support.  The vagina was prepped with Hibiclens and after prepping the urethra with Betadine, a latex free foley catheter was placed to gravity.  The abdomen was prepped with chlorhexidine.  A small vertical midline incision was made several centimeters below the umbilicus and carried down to the the fascia with monopolar electrocautery.  The fascia was incised and extended along the length of the incision. The abdominal cavity was entered sharply and without incident.  A small wound protector was inserted.  Using the trocar, the large cystic portion of the mass was drained in a controlled fashion.  A 2-0 Vicryl figure-of-eight was used to close the hole after completion of drainage, with approximately 6 L of clear fluid noted.  Given the remaining cystic and solid size of the mass, the incision was extended several centimeters inferiorly and to the superior aspect just lateral of the umbilicus.  Another cystic appearing portion of the ovary was entered with the gallbladder trocar: However, clear mucinous fluid was noted with some small amount of spillage and attempted drainage.  Given thickness of the fluid, further drainage of the mass was not possible and a figure-of-eight with 2-0 Vicryl was used to close the second incision on the ovary.    Attention was then turned to the filmy omentum, and a combination of monopolar electrocautery and bipolar electrocautery were used to lyse adhesions of the omentum to the anterior  abdominal wall and right lateral aspect of the uterus and right sidewall.  Once  mobilized in the pelvis, attention was turned back to the left ovary, which was delivered through the incision.  A window was made just inferior to the ovary and bipolar electrocautery was used to cauterize and transect the fallopian tube.  Kelly clamps were then used to doubly clamp the infundibulopelvic ligament which was then cut and doubly suture-ligated with 2-0 Vicryl.  The left ovary and distal fallopian tube was then handed off the field and sent for frozen pathology.  A Bookwalter retractor was then placed. A survey of the abdomen and pelvis revealed the above findings. The right tube and ovary were grossly normal in appearance. After packing the small bowel into the upper abdomen, bipolar electrocautery was used to cauterize and transect the proximal fallopian tube just lateral to the left uterine cornua.  The ureter was palpated deep in the pelvis.  Bipolar electrocautery was then used to cauterize and transect along the mesosalpinx and ultimately the infundibulopelvic ligament was regrasped clamped x2, cut, and suture-ligated x2 with 2-0 Vicryl suture.  Some bleeding was noted from the left cornua of the uterus, and bipolar electrocautery was used to achieve hemostasis.  Once frozen pathology results were known, staging was undertaken.  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  Peritoneal biopsies were taken from bilateral pelvic sidewalls, the anterior cul-de-sac, and the posterior cul-de-sac.  Along the posterior cul-de-sac there was one 76mm nodule that was removed as the biopsy.  An infragastric omentectomy was then performed after dissecting the omentum off of the transverse colon. This was taken down with the LigaSure. Palpation of the upper abdomen, including the liver, spleen, stomach, and pancreas, was normal.  Diaphragm scrapings were performed with a tongue  depressor.    The small bowel was run from the cecum to the ligament of Treitz.  2 small nodules were noted along the small bowel mesentery (of the ileum).  Both were removed and sent to pathology.  Appendix was surgically absent.  The abdomen and pelvis were then copiously irrigated and all surgical sites found to be hemostatic. The fascia was reapproximated with #1 looped PDS using a total of two sutures. The subcutaneous layer was then irrigated and Exparel injected.  The subcutaneous tissue was reapproximated with interrupted 2-0 Vicryl. The subcutaneous layer was then reapproximated with a running 4-0 Monocryl.  Dermabond was applied to the skin followed by a honeycomb dressing.    Attention was then turned below.  Endometrial biopsy was performed with the uterus sounding to just over 5 cm.  Small amount of blood and mucinous tissue collected and sent to pathology.  The patient tolerated the procedure well. Sponge, lap and needle counts were correct x 3.    The patient had sequential compression devices for VTE prophylaxis and received subcutaneous heparin preoperatively.   The patient was extubated and taken to the recovery room in stable condition.   Jeral Pinch MD Gynecologic Oncology

## 2018-12-25 NOTE — Interval H&P Note (Signed)
History and Physical Interval Note:  12/25/2018 12:23 PM  Jasmine [name]  has presented today for surgery, with the diagnosis of PELVIC MASS.  The various methods of treatment have been discussed with the patient and family. After consideration of risks, benefits and other options for treatment, the patient has consented to  Procedure(s): OPEN UNILATERAL  SALPINGO OOPHORECTOMY WITH POSSIBLE STAGEING (N/A) as a surgical intervention.  The patient's history has been reviewed, patient examined, no change in status, stable for surgery.  I have reviewed the patient's chart and labs.  Questions were answered to the patient's satisfaction.     Lafonda Mosses

## 2018-12-26 ENCOUNTER — Encounter: Payer: Self-pay | Admitting: *Deleted

## 2018-12-26 LAB — CBC
HCT: 32.2 % — ABNORMAL LOW (ref 36.0–46.0)
Hemoglobin: 9.7 g/dL — ABNORMAL LOW (ref 12.0–15.0)
MCH: 23.6 pg — ABNORMAL LOW (ref 26.0–34.0)
MCHC: 30.1 g/dL (ref 30.0–36.0)
MCV: 78.3 fL — ABNORMAL LOW (ref 80.0–100.0)
Platelets: 271 10*3/uL (ref 150–400)
RBC: 4.11 MIL/uL (ref 3.87–5.11)
RDW: 16.6 % — ABNORMAL HIGH (ref 11.5–15.5)
WBC: 16.4 10*3/uL — ABNORMAL HIGH (ref 4.0–10.5)
nRBC: 0 % (ref 0.0–0.2)

## 2018-12-26 LAB — BASIC METABOLIC PANEL
Anion gap: 13 (ref 5–15)
BUN: <5 mg/dL — ABNORMAL LOW (ref 6–20)
CO2: 25 mmol/L (ref 22–32)
Calcium: 8.6 mg/dL — ABNORMAL LOW (ref 8.9–10.3)
Chloride: 101 mmol/L (ref 98–111)
Creatinine, Ser: 0.54 mg/dL (ref 0.44–1.00)
GFR calc Af Amer: >60 mL/min (ref >60)
GFR calc non Af Amer: >60 mL/min (ref >60)
Glucose, Bld: 91 mg/dL (ref 70–99)
Potassium: 2.9 mmol/L — ABNORMAL LOW (ref 3.5–5.1)
Sodium: 139 mmol/L (ref 135–145)

## 2018-12-26 LAB — ABO/RH: ABO/RH(D): B POS

## 2018-12-26 MED ORDER — ENSURE ENLIVE PO LIQD
237.0000 mL | Freq: Two times a day (BID) | ORAL | Status: DC
  Administered 2018-12-26 – 2018-12-28 (×5): 237 mL via ORAL

## 2018-12-26 MED ORDER — ENOXAPARIN SODIUM 40 MG/0.4ML ~~LOC~~ SOLN: 40.0000 mg | SUBCUTANEOUS | 0 refills | Status: DC

## 2018-12-26 MED ORDER — ADULT MULTIVITAMIN W/MINERALS CH
1.0000 | ORAL_TABLET | Freq: Every day | ORAL | Status: DC
  Administered 2018-12-26 – 2018-12-28 (×3): 1 via ORAL
  Filled 2018-12-26 (×3): qty 1

## 2018-12-26 MED ORDER — SODIUM CHLORIDE 0.9 % IV SOLN
INTRAVENOUS | Status: DC | PRN
  Administered 2018-12-26: 250 mL via INTRAVENOUS

## 2018-12-26 MED ORDER — POTASSIUM CHLORIDE 10 MEQ/50ML IV SOLN
10.0000 meq | INTRAVENOUS | Status: DC
  Filled 2018-12-26 (×4): qty 50

## 2018-12-26 MED ORDER — POTASSIUM CHLORIDE 10 MEQ/100ML IV SOLN
10.0000 meq | INTRAVENOUS | Status: AC
  Administered 2018-12-26 (×4): 10 meq via INTRAVENOUS
  Filled 2018-12-26 (×4): qty 100

## 2018-12-26 MED ORDER — ENOXAPARIN (LOVENOX) PATIENT EDUCATION KIT
PACK | Freq: Once | Status: AC
  Administered 2018-12-26: 17:00:00
  Filled 2018-12-26: qty 1

## 2018-12-26 MED ORDER — HYDROMORPHONE HCL 1 MG/ML IJ SOLN
1.0000 mg | INTRAMUSCULAR | Status: DC | PRN
  Administered 2018-12-26 – 2018-12-27 (×5): 1 mg via INTRAVENOUS
  Filled 2018-12-26 (×5): qty 1

## 2018-12-26 MED ORDER — OXYCODONE HCL 5 MG PO TABS: 5.0000 mg | ORAL_TABLET | ORAL | 0 refills | Status: DC | PRN

## 2018-12-26 MED ORDER — SENNOSIDES-DOCUSATE SODIUM 8.6-50 MG PO TABS: 2.0000 | ORAL_TABLET | Freq: Every day | ORAL | 1 refills | Status: DC

## 2018-12-26 NOTE — Progress Notes (Signed)
1 Day Post-Op Procedure(s) (LRB): OPEN UNILATERAL  SALPINGO OOPHORECTOMY WITH STAGING (N/A) OMENTECTOMY (N/A)  (late entry)  Subjective: Patient reports poor pain control overnight, did not sleep much. Has intermittent cramping abdominal pain and at times has pain with inspiration and right shoulder pain. Has tolerated minimal liquids, has not wanted to try solids given pain. Reports intermittent nausea, one episode of emesis last night. Voiding freely. Denies flatus. Has only ambulated to bedside commode. Denies shortness of breath.   Objective: Vital signs in last 24 hours: Temp:  [98.6 F (37 C)-99.5 F (37.5 C)] 98.6 F (37 C) (12/11 1342) Pulse Rate:  [57-81] 81 (12/11 1342) Resp:  [16-18] 16 (12/11 1342) BP: (109-135)/(58-76) 109/63 (12/11 1342) SpO2:  [98 %-100 %] 100 % (12/11 1342) Last BM Date: 12/24/18  Intake/Output from previous day: 12/10 0701 - 12/11 0700 In: 3693.5 [P.O.:60; I.V.:3383.5; IV Piggyback:250] Out: 8400 [Urine:2300; Blood:100]  Physical Examination: NAD, alert and oriented Regular rate and rhythm, no m/r/g Unlabored breath on room air, shallow at times, no wheezes or rhonchi Abdomen soft, moderately tender to palpation diffusely, some guarding, hypoactive bowel sounds, incision with honeycomb dressing that is c/d/i Well perfused extremities, no edema  Labs: CBC    Component Value Date/Time   WBC 16.4 (H) 12/26/2018 0847   RBC 4.11 12/26/2018 0847   HGB 9.7 (L) 12/26/2018 0847   HGB 9.9 (L) 12/22/2018 1356   HGB 11.9 08/20/2017 1144   HCT 32.2 (L) 12/26/2018 0847   HCT 38.0 08/20/2017 1144   PLT 271 12/26/2018 0847   PLT 257 12/22/2018 1356   PLT 294 08/20/2017 1144   MCV 78.3 (L) 12/26/2018 0847   MCV 87 08/20/2017 1144   MCH 23.6 (L) 12/26/2018 0847   MCHC 30.1 12/26/2018 0847   RDW 16.6 (H) 12/26/2018 0847   RDW 14.3 08/20/2017 1144   LYMPHSABS 2.5 12/22/2018 1356   LYMPHSABS 3.1 08/20/2017 1144   MONOABS 0.6 12/22/2018 1356   EOSABS 0.1 12/22/2018 1356   EOSABS 0.3 08/20/2017 1144   BASOSABS 0.0 12/22/2018 1356   BASOSABS 0.0 08/20/2017 1144   CMP Latest Ref Rng & Units 12/26/2018 12/25/2018 12/22/2018  Glucose 70 - 99 mg/dL 91 82 76  BUN 6 - 20 mg/dL <5(L) 5(L) <4(L)  Creatinine 0.44 - 1.00 mg/dL 0.54 0.62 0.76  Sodium 135 - 145 mmol/L 139 139 139  Potassium 3.5 - 5.1 mmol/L 2.9(L) 3.1(L) 3.2(L)  Chloride 98 - 111 mmol/L 101 107 106  CO2 22 - 32 mmol/L 25 24 23   Calcium 8.9 - 10.3 mg/dL 8.6(L) 9.0 8.5(L)  Total Protein 6.5 - 8.1 g/dL - - -  Total Bilirubin 0.3 - 1.2 mg/dL - - -  Alkaline Phos 38 - 126 U/L - - -  AST 15 - 41 U/L - - -  ALT 0 - 44 U/L - - -    Assessment:  28 y.o. s/p Procedure(s): OPEN UNILATERAL  SALPINGO OOPHORECTOMY WITH STAGING OMENTECTOMY  Pain:  Pain not well controlled. Very tender, given her description, I suspect she may have some gas and GI -related pain. Will increase IV dilaudid dose and consider PCA if this is not helpful. Patient can't take toradol or ibuprofen. Discussed use of an abdominal binder, already utilizing a heating pad. Given normal VS and appropriate H&H, very low suspicion for intra-abdominal bleeding.   Heme: Acute on chronic anemia in the setting of blood loss from surgery. Asymptomatic.  ID: Leukocytosis likely secondary to steroids peri-op. Will continue to monitor.  Pulm: Continue asthma medications. Seen by pulm for clearance prior to surgery.  GI/FEN: Encouraged advancing diet as able. Will continue fluids until tolerating diet. Potassium repletion ordered, IV. Will recehck K in the am.   Neuro: Continue medications for epilepsy.  Prophylaxis: SCDs, will start ppx lovenox this am. 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  Plan: Anticipate discharge home Saturday vs. Sunday pending meeting post-op milestones and improved pain control.   LOS: 1 day    Jasmine [name] 12/26/2018, 8:17 PM

## 2018-12-26 NOTE — Anesthesia Postprocedure Evaluation (Signed)
Anesthesia Post Note  Patient: Jasmine [name]  Procedure(s) Performed: OPEN UNILATERAL  SALPINGO OOPHORECTOMY WITH STAGING (N/A ) OMENTECTOMY (N/A )     Patient location during evaluation: PACU Anesthesia Type: General Level of consciousness: awake and alert Pain management: pain level controlled Vital Signs Assessment: post-procedure vital signs reviewed and stable Respiratory status: spontaneous breathing, nonlabored ventilation, respiratory function stable and patient connected to nasal cannula oxygen Cardiovascular status: blood pressure returned to baseline and stable Postop Assessment: no apparent nausea or vomiting Anesthetic complications: no    Last Vitals:  Vitals:   12/26/18 0115 12/26/18 0459  BP: 127/63 135/73  Pulse: 68 (!) 57  Resp: 16 16  Temp: 37.4 C 37.5 C  SpO2: 100% 100%    Last Pain:  Vitals:   12/26/18 0958  TempSrc:   PainSc: Blanco Alexzavier Girardin

## 2018-12-26 NOTE — Discharge Instructions (Signed)
You prescriptions for home will be sent to your pharmacy  Return to work: 4-6 weeks  Activity: 1. Be up and out of the bed during the day.  Take a nap if needed.  You may walk up steps but be careful and use the hand rail.  Stair climbing will tire you more than you think, you may need to stop part way and rest.   2. No lifting or straining for 6 weeks.  3. No driving until you can brake safely, for most patients this is 1-2 weeks.  Do Not drive if you are taking narcotic pain medicine.  4. Shower daily.  Use soap and water on your incision and pat dry; don't rub.   5. No sexual activity and nothing in the vagina for 6 weeks.  Medications:  - Take tylenol first line for pain control. Take these regularly (every 6 hours) to decrease the build up of pain.  - If necessary, for severe pain not relieved by ibuprofen, take oxycodone.  - While taking oxycodone you should take sennakot every night to reduce the likelihood of constipation. If this causes diarrhea, stop its use.  Diet: 1. Low sodium Heart Healthy Diet is recommended.  2. It is safe to use a laxative if you have difficulty moving your bowels.   Wound Care: 1. Keep clean and dry.  Shower daily.  Reasons to call the Doctor:   Fever - Oral temperature greater than 100.4 degrees Fahrenheit  Foul-smelling vaginal discharge  Difficulty urinating  Nausea and vomiting  Increased pain at the site of the incision that is unrelieved with pain medicine.  Difficulty breathing with or without chest pain  New calf pain especially if only on one side  Sudden, continuing increased vaginal bleeding with or without clots.   Follow-up: 1. See Dr. Berline Lopes in 3 weeks. 2. Dr. Berline Lopes will call you with the results from you final pathology once available.  Contacts: For questions or concerns you should contact:  Dr. Jeral Pinch or Joylene John at [phone] After hours and on week-ends call [phone] and ask to speak  to the physician on call for Gynecologic Oncology  Enoxaparin injection What is this medicine? ENOXAPARIN (ee nox a PA rin) is used after knee, hip, or abdominal surgeries to prevent blood clotting. It is also used to treat existing blood clots in the lungs or in the veins. This medicine may be used for other purposes; ask your health care provider or pharmacist if you have questions. COMMON BRAND NAME(S): Lovenox What should I tell my health care provider before I take this medicine? They need to know if you have any of these conditions:  bleeding disorders, hemorrhage, or hemophilia  infection of the heart or heart valves  kidney or liver disease  previous stroke  prosthetic heart valve  recent surgery or delivery of a baby  ulcer in the stomach or intestine, diverticulitis, or other bowel disease  an unusual or allergic reaction to enoxaparin, heparin, pork or pork products, other medicines, foods, dyes, or preservatives  pregnant or trying to get pregnant  breast-feeding How should I use this medicine? This medicine is for injection under the skin. It is usually given by a health-care professional. You or a family member may be trained on how to give the injections. If you are to give yourself injections, make sure you understand how to use the syringe, measure the dose if necessary, and give the injection. To avoid bruising, do not rub  the site where this medicine has been injected. Do not take your medicine more often than directed. Do not stop taking except on the advice of your doctor or health care professional. Make sure you receive a puncture-resistant container to dispose of the needles and syringes once you have finished with them. Do not reuse these items. Return the container to your doctor or health care professional for proper disposal. Talk to your pediatrician regarding the use of this medicine in children. Special care may be needed. Overdosage: If you think you  have taken too much of this medicine contact a poison control center or emergency room at once. NOTE: This medicine is only for you. Do not share this medicine with others. What if I miss a dose? If you miss a dose, take it as soon as you can. If it is almost time for your next dose, take only that dose. Do not take double or extra doses. What may interact with this medicine?  aspirin and aspirin-like medicines  certain medicines that treat or prevent blood clots  dipyridamole  NSAIDs, medicines for pain and inflammation, like ibuprofen or naproxen This list may not describe all possible interactions. Give your health care provider a list of all the medicines, herbs, non-prescription drugs, or dietary supplements you use. Also tell them if you smoke, drink alcohol, or use illegal drugs. Some items may interact with your medicine. What should I watch for while using this medicine? Visit your healthcare professional for regular checks on your progress. You may need blood work done while you are taking this medicine. Your condition will be monitored carefully while you are receiving this medicine. It is important not to miss any appointments. If you are going to need surgery or other procedure, tell your healthcare professional that you are using this medicine. Using this medicine for a long time may weaken your bones and increase the risk of bone fractures. Avoid sports and activities that might cause injury while you are using this medicine. Severe falls or injuries can cause unseen bleeding. Be careful when using sharp tools or knives. Consider using an Copy. Take special care brushing or flossing your teeth. Report any injuries, bruising, or red spots on the skin to your healthcare professional. Wear a medical ID bracelet or chain. Carry a card that describes your disease and details of your medicine and dosage times. What side effects may I notice from receiving this medicine? Side  effects that you should report to your doctor or health care professional as soon as possible:  allergic reactions like skin rash, itching or hives, swelling of the face, lips, or tongue  bone pain  signs and symptoms of bleeding such as bloody or black, tarry stools; red or dark-brown urine; spitting up blood or brown material that looks like coffee grounds; red spots on the skin; unusual bruising or bleeding from the eye, gums, or nose  signs and symptoms of a blood clot such as chest pain; shortness of breath; pain, swelling, or warmth in the leg  signs and symptoms of a stroke such as changes in vision; confusion; trouble speaking or understanding; severe headaches; sudden numbness or weakness of the face, arm or leg; trouble walking; dizziness; loss of coordination Side effects that usually do not require medical attention (report to your doctor or health care professional if they continue or are bothersome):  hair loss  pain, redness, or irritation at site where injected This list may not describe all possible side effects.  Call your doctor for medical advice about side effects. You may report side effects to FDA at 1-800-FDA-1088. Where should I keep my medicine? Keep out of the reach of children. Store at room temperature between 15 and 30 degrees C (59 and 86 degrees F). Do not freeze. If your injections have been specially prepared, you may need to store them in the refrigerator. Ask your pharmacist. Throw away any unused medicine after the expiration date. NOTE: This sheet is a summary. It may not cover all possible information. If you have questions about this medicine, talk to your doctor, pharmacist, or health care provider.  2020 Elsevier/Gold Standard (2016-12-27 11:25:34)  Oxycodone tablets or capsules What is this medicine? OXYCODONE (ox i KOE done) is a pain reliever. It is used to treat moderate to severe pain. This medicine may be used for other purposes; ask your  health care provider or pharmacist if you have questions. COMMON BRAND NAME(S): Dazidox, Endocodone, Oxaydo, OXECTA, OxyIR, Percolone, Roxicodone, Roxybond What should I tell my health care provider before I take this medicine? They need to know if you have any of these conditions:  Addison's disease  brain tumor  head injury  heart disease  history of drug or alcohol abuse problem  if you often drink alcohol  kidney disease  liver disease  lung or breathing disease, like asthma  mental illness  pancreatic disease  seizures  thyroid disease  an unusual or allergic reaction to oxycodone, codeine, hydrocodone, morphine, other medicines, foods, dyes, or preservatives  pregnant or trying to get pregnant  breast-feeding How should I use this medicine? Take this medicine by mouth with a glass of water. Follow the directions on the prescription label. You can take it with or without food. If it upsets your stomach, take it with food. Take your medicine at regular intervals. Do not take it more often than directed. Do not stop taking except on your doctor's advice. Some brands of this medicine, like Oxecta, have special instructions. Ask your doctor or pharmacist if these directions are for you: Do not cut, crush or chew this medicine. Swallow only one tablet at a time. Do not wet, soak, or lick the tablet before you take it. A special MedGuide will be given to you by the pharmacist with each prescription and refill. Be sure to read this information carefully each time. Talk to your pediatrician regarding the use of this medicine in children. Special care may be needed. Overdosage: If you think you have taken too much of this medicine contact a poison control center or emergency room at once. NOTE: This medicine is only for you. Do not share this medicine with others. What if I miss a dose? If you miss a dose, take it as soon as you can. If it is almost time for your next dose,  take only that dose. Do not take double or extra doses. What may interact with this medicine? This medicine may interact with the following medications:  alcohol  antihistamines for allergy, cough and cold  antiviral medicines for HIV or AIDS  atropine  certain antibiotics like clarithromycin, erythromycin, linezolid, rifampin  certain medicines for anxiety or sleep  certain medicines for bladder problems like oxybutynin, tolterodine  certain medicines for depression like amitriptyline, fluoxetine, sertraline  certain medicines for fungal infections like ketoconazole, itraconazole, voriconazole  certain medicines for migraine headache like almotriptan, eletriptan, frovatriptan, naratriptan, rizatriptan, sumatriptan, zolmitriptan  certain medicines for nausea or vomiting like dolasetron, ondansetron, palonosetron  certain  medicines for Parkinson's disease like benztropine, trihexyphenidyl  certain medicines for seizures like phenobarbital, phenytoin, primidone  certain medicines for stomach problems like dicyclomine, hyoscyamine  certain medicines for travel sickness like scopolamine  diuretics  general anesthetics like halothane, isoflurane, methoxyflurane, propofol  ipratropium  local anesthetics like lidocaine, pramoxine, tetracaine  MAOIs like Carbex, Eldepryl, Marplan, Nardil, and Parnate  medicines that relax muscles for surgery  methylene blue  nilotinib  other narcotic medicines for pain or cough  phenothiazines like chlorpromazine, mesoridazine, prochlorperazine, thioridazine This list may not describe all possible interactions. Give your health care provider a list of all the medicines, herbs, non-prescription drugs, or dietary supplements you use. Also tell them if you smoke, drink alcohol, or use illegal drugs. Some items may interact with your medicine. What should I watch for while using this medicine? Tell your doctor or health care professional if  your pain does not go away, if it gets worse, or if you have new or a different type of pain. You may develop tolerance to the medicine. Tolerance means that you will need a higher dose of the medicine for pain relief. Tolerance is normal and is expected if you take this medicine for a long time. Do not suddenly stop taking your medicine because you may develop a severe reaction. Your body becomes used to the medicine. This does NOT mean you are addicted. Addiction is a behavior related to getting and using a drug for a non-medical reason. If you have pain, you have a medical reason to take pain medicine. Your doctor will tell you how much medicine to take. If your doctor wants you to stop the medicine, the dose will be slowly lowered over time to avoid any side effects. There are different types of narcotic medicines (opiates). If you take more than one type at the same time or if you are taking another medicine that also causes drowsiness, you may have more side effects. Give your health care provider a list of all medicines you use. Your doctor will tell you how much medicine to take. Do not take more medicine than directed. Call emergency for help if you have problems breathing or unusual sleepiness. You may get drowsy or dizzy. Do not drive, use machinery, or do anything that needs mental alertness until you know how the medicine affects you. Do not stand or sit up quickly, especially if you are an older patient. This reduces the risk of dizzy or fainting spells. Alcohol may interfere with the effect of this medicine. Avoid alcoholic drinks. This medicine will cause constipation. Try to have a bowel movement at least every 2 to 3 days. If you do not have a bowel movement for 3 days, call your doctor or health care professional. Your mouth may get dry. Chewing sugarless gum or sucking hard candy, and drinking plenty of water may help. Contact your doctor if the problem does not go away or is severe. What  side effects may I notice from receiving this medicine? Side effects that you should report to your doctor or health care professional as soon as possible:  allergic reactions like skin rash, itching or hives, swelling of the face, lips, or tongue  breathing problems  confusion  signs and symptoms of low blood pressure like dizziness; feeling faint or lightheaded, falls; unusually weak or tired  trouble passing urine or change in the amount of urine  trouble swallowing Side effects that usually do not require medical attention (report to your doctor or  health care professional if they continue or are bothersome):  constipation  dry mouth  nausea, vomiting  tiredness This list may not describe all possible side effects. Call your doctor for medical advice about side effects. You may report side effects to FDA at 1-800-FDA-1088. Where should I keep my medicine? Keep out of the reach of children. This medicine can be abused. Keep your medicine in a safe place to protect it from theft. Do not share this medicine with anyone. Selling or giving away this medicine is dangerous and against the law. Store at room temperature between 15 and 30 degrees C (59 and 86 degrees F). Protect from light. Keep container tightly closed. This medicine may cause harm and death if it is taken by other adults, children, or pets. Return medicine that has not been used to an official disposal site. Contact the DEA at 830-797-9498 or your city/county government to find a site. If you cannot return the medicine, flush it down the toilet. Do not use the medicine after the expiration date. NOTE: This sheet is a summary. It may not cover all possible information. If you have questions about this medicine, talk to your doctor, pharmacist, or health care provider.  2020 Elsevier/Gold Standard (2016-05-08 16:13:10)

## 2018-12-26 NOTE — Progress Notes (Signed)
Patient resting quietly in bed.  States she still has moderate incisional pain with movement and taking a deep breath but the adjustment to the pain medication dosing has helped.  She sat up in the chair for the majority of the morning and tolerated it well. Voiding without difficulty.  No needs voiced at this time.

## 2018-12-26 NOTE — Progress Notes (Signed)
Initial Nutrition Assessment  RD working remotely.   DOCUMENTATION CODES:   Not applicable  INTERVENTION:  - continue Ensure Enlive BID, each supplement provides 350 kcal and 20 grams of protein. - will order Magic Cup with dinner meals, each supplement provides 290 kcal and 9 grams of protein. - will order daily multivitamin with minerals.  - will complete NFPE and obtain nutrition-related information at follow-up.   NUTRITION DIAGNOSIS:   Increased nutrient needs related to post-op healing as evidenced by estimated needs.  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 She was admitted with plan for ex lap with unilateral salpingo-oophorectomy.  No intakes documented since admission and diet advancement following surgery. Patient needed nursing assistance at the time of attempted RD visit. Per chart review, current weight is 166 lb and weight has been stable for the past 1 month.   Patient is POD #1 open unilateral salpingo oophorectomy with staging, omentectomy.  Ensure was ordered BID and patient has accepted both bottles offered to her so far.    Labs reviewed; K: 2.9 mmol/l, BUN: <5 mg/dl, Ca: 8.6 mg/dl. Medications reviewed; 10 mEq IV KCl x4 runs 12/11, 2 tablets senokot/day.  IVF; D5-1/2 NS-20 mEq KCl @ 100 ml/hr (408 kcal).    NUTRITION - FOCUSED PHYSICAL EXAM:  unable to complete at this time.   Diet Order:   Diet Order            Diet regular Room service appropriate? Yes; Fluid consistency: Thin  Diet effective now              EDUCATION NEEDS:   No education needs have been identified at this time  Skin:  Skin Assessment: Skin Integrity Issues: Skin Integrity Issues:: Incisions Incisions: abdomen, perineum  (12/10)  Last BM:  12/9  Height:   Ht Readings from Last 1 Encounters:  12/25/18 5\' 6"  (1.676 m)    Weight:   Wt Readings from Last 1 Encounters:  12/25/18 75.5 kg    Ideal Body Weight:  59.1 kg  BMI:  Body mass index is 26.86 kg/m.  Estimated Nutritional Needs:   Kcal:  2100-2300  Protein:  105-115 grams  Fluid:  >/= 2.2 L/day      Jarome Matin, MS, RD, LDN, [facility] Inpatient Clinical Dietitian Pager # [phone] After hours/weekend pager # [phone]

## 2018-12-27 ENCOUNTER — Inpatient Hospital Stay (HOSPITAL_COMMUNITY): Payer: Medicaid Other

## 2018-12-27 LAB — BASIC METABOLIC PANEL
Anion gap: 14 (ref 5–15)
BUN: <5 mg/dL — ABNORMAL LOW (ref 6–20)
CO2: 23 mmol/L (ref 22–32)
Calcium: 8.9 mg/dL (ref 8.9–10.3)
Chloride: 100 mmol/L (ref 98–111)
Creatinine, Ser: 0.65 mg/dL (ref 0.44–1.00)
GFR calc Af Amer: >60 mL/min (ref >60)
GFR calc non Af Amer: >60 mL/min (ref >60)
Glucose, Bld: 118 mg/dL — ABNORMAL HIGH (ref 70–99)
Potassium: 3.6 mmol/L (ref 3.5–5.1)
Sodium: 137 mmol/L (ref 135–145)

## 2018-12-27 LAB — CBC
HCT: 32.3 % — ABNORMAL LOW (ref 36.0–46.0)
Hemoglobin: 9.8 g/dL — ABNORMAL LOW (ref 12.0–15.0)
MCH: 23.7 pg — ABNORMAL LOW (ref 26.0–34.0)
MCHC: 30.3 g/dL (ref 30.0–36.0)
MCV: 78 fL — ABNORMAL LOW (ref 80.0–100.0)
Platelets: 291 10*3/uL (ref 150–400)
RBC: 4.14 MIL/uL (ref 3.87–5.11)
RDW: 16.6 % — ABNORMAL HIGH (ref 11.5–15.5)
WBC: 13.1 10*3/uL — ABNORMAL HIGH (ref 4.0–10.5)
nRBC: 0 % (ref 0.0–0.2)

## 2018-12-27 LAB — CEA: CEA: 5.1 ng/mL — ABNORMAL HIGH (ref 0.0–4.7)

## 2018-12-27 MED ORDER — IOHEXOL 9 MG/ML PO SOLN
ORAL | Status: AC
  Filled 2018-12-27: qty 1000

## 2018-12-27 MED ORDER — DIPHENHYDRAMINE HCL 50 MG/ML IJ SOLN: 50.0000 mg | Freq: Once | INTRAMUSCULAR | Status: DC

## 2018-12-27 MED ORDER — HYDROMORPHONE 1 MG/ML IV SOLN
INTRAVENOUS | Status: DC
  Administered 2018-12-27: 1.5 mg via INTRAVENOUS
  Administered 2018-12-27: 30 mg via INTRAVENOUS
  Administered 2018-12-27: 1.8 mg via INTRAVENOUS
  Administered 2018-12-28: 2.7 mg via INTRAVENOUS
  Administered 2018-12-28: 1.8 mg via INTRAVENOUS
  Administered 2018-12-28: 0.6 mg via INTRAVENOUS
  Filled 2018-12-27: qty 30

## 2018-12-27 MED ORDER — DIPHENHYDRAMINE HCL 50 MG/ML IJ SOLN: 12.5000 mg | Freq: Four times a day (QID) | INTRAMUSCULAR | Status: DC | PRN

## 2018-12-27 MED ORDER — NALOXONE HCL 0.4 MG/ML IJ SOLN: 0.4000 mg | INTRAMUSCULAR | Status: DC | PRN

## 2018-12-27 MED ORDER — DIPHENHYDRAMINE HCL 12.5 MG/5ML PO ELIX: 12.5000 mg | ORAL_SOLUTION | Freq: Four times a day (QID) | ORAL | Status: DC | PRN

## 2018-12-27 MED ORDER — SODIUM CHLORIDE 0.9% FLUSH: 9.0000 mL | INTRAVENOUS | Status: DC | PRN

## 2018-12-27 MED ORDER — LORAZEPAM 1 MG PO TABS
1.0000 mg | ORAL_TABLET | Freq: Once | ORAL | Status: AC | PRN
  Administered 2018-12-27: 1 mg via ORAL
  Filled 2018-12-27: qty 1

## 2018-12-27 MED ORDER — DIPHENHYDRAMINE HCL 25 MG PO CAPS: 50.0000 mg | ORAL_CAPSULE | Freq: Once | ORAL | Status: DC

## 2018-12-27 MED ORDER — ONDANSETRON HCL 4 MG/2ML IJ SOLN
4.0000 mg | Freq: Four times a day (QID) | INTRAMUSCULAR | Status: DC | PRN
  Filled 2018-12-27: qty 2

## 2018-12-27 NOTE — Progress Notes (Signed)
Discussed CT findings with on call radiologist. Large volume of free air (possibly consistent with POD2 status). Recommendation is for repeat CT with contrast (oral and IV) tomorrow if she has persistent significant pain issues. We will need to premedicate for this.  Patient's nurse reports to me that she is significantly more comfortable with PCA in place.  Vitals remain normal. BP 123/80 (BP Location: Left Arm)   Pulse 85   Temp 98.6 F (37 C) (Oral)   Resp (!) 24   Ht 5\' 6"  (1.676 m)   Wt 166 lb 6.4 oz (75.5 kg)   LMP 12/20/2018 (Approximate)   SpO2 100%   BMI 26.86 kg/m   Thereasa Solo, MD

## 2018-12-27 NOTE — Progress Notes (Signed)
2 Days Post-Op Procedure(s) (LRB): OPEN UNILATERAL  SALPINGO OOPHORECTOMY WITH STAGING (N/A) OMENTECTOMY (N/A)  Subjective: Patient reports severe pain. She was tearful and non-conversing with me due to severe pain all night. Though, when asked, the patient reported that she has pain that is the same intensity as it was yesterday (where she was reportedly more comfortable for the providers who rounded).  She is passing flatus and tolerating PO. Pain not worse with palpation - constant burning sensation in the incision.   Objective: Vital signs in last 24 hours: Temp:  [98.6 F (37 C)-100.9 F (38.3 C)] 98.6 F (37 C) (12/12 0502) Pulse Rate:  [71-99] 85 (12/12 0502) Resp:  [16-20] 16 (12/12 0502) BP: (109-138)/(58-82) 123/80 (12/12 0502) SpO2:  [94 %-100 %] 99 % (12/12 0502) Last BM Date: 12/25/18  Intake/Output from previous day: 12/11 0701 - 12/12 0700 In: 2578.6 [P.O.:360; I.V.:2208.6; IV Piggyback:10] Out: 2650 [Urine:2650]  Physical Examination: Crying, tearful, limited conversation Regular rate and rhythm, no m/r/g Unlabored breath on room air, shallow at times, no wheezes or rhonchi Abdomen soft, soft and denied tenderness to palpation, no guarding, hypoactive bowel sounds, incision with honeycomb dressing that is c/d/i Well perfused extremities, no edema  Labs: CBC    Component Value Date/Time   WBC 13.1 (H) 12/27/2018 0442   RBC 4.14 12/27/2018 0442   HGB 9.8 (L) 12/27/2018 0442   HGB 9.9 (L) 12/22/2018 1356   HGB 11.9 08/20/2017 1144   HCT 32.3 (L) 12/27/2018 0442   HCT 38.0 08/20/2017 1144   PLT 291 12/27/2018 0442   PLT 257 12/22/2018 1356   PLT 294 08/20/2017 1144   MCV 78.0 (L) 12/27/2018 0442   MCV 87 08/20/2017 1144   MCH 23.7 (L) 12/27/2018 0442   MCHC 30.3 12/27/2018 0442   RDW 16.6 (H) 12/27/2018 0442   RDW 14.3 08/20/2017 1144   LYMPHSABS 2.5 12/22/2018 1356   LYMPHSABS 3.1 08/20/2017 1144   MONOABS 0.6 12/22/2018 1356   EOSABS 0.1  12/22/2018 1356   EOSABS 0.3 08/20/2017 1144   BASOSABS 0.0 12/22/2018 1356   BASOSABS 0.0 08/20/2017 1144   CMP Latest Ref Rng & Units 12/27/2018 12/26/2018 12/25/2018  Glucose 70 - 99 mg/dL 118(H) 91 82  BUN 6 - 20 mg/dL <5(L) <5(L) 5(L)  Creatinine 0.44 - 1.00 mg/dL 0.65 0.54 0.62  Sodium 135 - 145 mmol/L 137 139 139  Potassium 3.5 - 5.1 mmol/L 3.6 2.9(L) 3.1(L)  Chloride 98 - 111 mmol/L 100 101 107  CO2 22 - 32 mmol/L 23 25 24   Calcium 8.9 - 10.3 mg/dL 8.9 8.6(L) 9.0  Total Protein 6.5 - 8.1 g/dL - - -  Total Bilirubin 0.3 - 1.2 mg/dL - - -  Alkaline Phos 38 - 126 U/L - - -  AST 15 - 41 U/L - - -  ALT 0 - 44 U/L - - -    Assessment:  28 y.o. s/p Procedure(s): OPEN UNILATERAL  SALPINGO OOPHORECTOMY WITH STAGING OMENTECTOMY  Pain:  Pain not well controlled. Normal labs and vital signs and abdominal exam. Will check a CT abd/pelvis given the extreme pain symptoms she reports without clear localizing signs. Will prescribe dilaudid PCA.  Heme: Acute on chronic anemia in the setting of blood loss from surgery. Asymptomatic.  ID: Leukocytosis improving, no sign of infection.  Pulm: Continue asthma medications. Seen by pulm for clearance prior to surgery.  GI/FEN: Encouraged advancing diet as able. TKO IVF's for PCA. Potassium at acceptable levels after replacement.  Neuro: Continue medications for epilepsy.  Prophylaxis: SCDs, will start ppx lovenox this am. 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  Plan: Anticipate discharge Sunday if pain better controlled and vitals continue to be normal.   LOS: 2 days    Thereasa Solo 12/27/2018, 9:42 AM

## 2018-12-27 NOTE — Progress Notes (Signed)
Patient has been tearful all shift due to excoriating pain. Despite attempts to control with IV dilaudid and PO oxy her pain has remained a 10. She says the medications helps for half an hour and then it is unbearable. Dr. Denman George authorized to give dilaudid 1 hour early and PO ativan. Will continue to monitor.

## 2018-12-28 LAB — CBC
HCT: 31.4 % — ABNORMAL LOW (ref 36.0–46.0)
Hemoglobin: 9.3 g/dL — ABNORMAL LOW (ref 12.0–15.0)
MCH: 23.2 pg — ABNORMAL LOW (ref 26.0–34.0)
MCHC: 29.6 g/dL — ABNORMAL LOW (ref 30.0–36.0)
MCV: 78.3 fL — ABNORMAL LOW (ref 80.0–100.0)
Platelets: 291 10*3/uL (ref 150–400)
RBC: 4.01 MIL/uL (ref 3.87–5.11)
RDW: 16.6 % — ABNORMAL HIGH (ref 11.5–15.5)
WBC: 11 10*3/uL — ABNORMAL HIGH (ref 4.0–10.5)
nRBC: 0 % (ref 0.0–0.2)

## 2018-12-28 LAB — BASIC METABOLIC PANEL
Anion gap: 9 (ref 5–15)
BUN: <5 mg/dL — ABNORMAL LOW (ref 6–20)
CO2: 27 mmol/L (ref 22–32)
Calcium: 9 mg/dL (ref 8.9–10.3)
Chloride: 100 mmol/L (ref 98–111)
Creatinine, Ser: 0.44 mg/dL (ref 0.44–1.00)
GFR calc Af Amer: >60 mL/min (ref >60)
GFR calc non Af Amer: >60 mL/min (ref >60)
Glucose, Bld: 101 mg/dL — ABNORMAL HIGH (ref 70–99)
Potassium: 3.2 mmol/L — ABNORMAL LOW (ref 3.5–5.1)
Sodium: 136 mmol/L (ref 135–145)

## 2018-12-28 MED ORDER — POTASSIUM CHLORIDE 20 MEQ PO PACK
20.0000 meq | PACK | Freq: Once | ORAL | Status: AC
  Administered 2018-12-28: 20 meq via ORAL
  Filled 2018-12-28: qty 1

## 2018-12-28 MED ORDER — ONDANSETRON 8 MG PO TBDP: 8.0000 mg | ORAL_TABLET | Freq: Three times a day (TID) | ORAL | 0 refills | Status: DC | PRN

## 2018-12-28 NOTE — Progress Notes (Signed)
Pt d/c  Home in stable condition through moderate pain remains. Pt teaching about lovenox as performed and pt was able to return demonstrate this administration. Pt to d/c home with family.

## 2018-12-28 NOTE — Discharge Summary (Signed)
Physician Discharge Summary  Patient ID: Jasmine [name] MRN: [id number] DOB/AGE: 11-Nov-1990 28 y.o.  Admit date: 12/25/2018 Discharge date: 12/28/2018  Admission Diagnoses: <principal problem not specified>  Discharge Diagnoses:  Active Problems:   Pelvic mass   Pelvic mass in female   Abnormal uterine bleeding   Discharged Condition: good  Hospital Course:  1/ patient was admitted on 12/25/18 for an ex lap, USO, omentectomy and staging biopsies for an ovarian mass. 2/ surgery was uncomplicated. Frozen section revealed "at least" borderline tumor of the ovary.  3/ on postoperative day 2 the patient had severe pain that was not controlled well with standard postop medications. This occurred in the setting of normal vital signs and normal labs and a reassuring physical exam. A non-contrasted CT of the abdomen was ordered and showed no obvious pathology. There was free air present but this was consistent with recent postop status.  She was prescribed a PCA and became instantly more comfortable.  On POD 3 the patient was meeting discharge criteria: tolerating PO, voiding urine, ambulating, pain well controlled on oral medications.  4/ new medications on discharge include oxycodone, zofran, lovenox x 2 weeks.  Consults: None  Significant Diagnostic Studies: labs:  CBC    Component Value Date/Time   WBC 11.0 (H) 12/28/2018 0445   RBC 4.01 12/28/2018 0445   HGB 9.3 (L) 12/28/2018 0445   HGB 9.9 (L) 12/22/2018 1356   HGB 11.9 08/20/2017 1144   HCT 31.4 (L) 12/28/2018 0445   HCT 38.0 08/20/2017 1144   PLT 291 12/28/2018 0445   PLT 257 12/22/2018 1356   PLT 294 08/20/2017 1144   MCV 78.3 (L) 12/28/2018 0445   MCV 87 08/20/2017 1144   MCH 23.2 (L) 12/28/2018 0445   MCHC 29.6 (L) 12/28/2018 0445   RDW 16.6 (H) 12/28/2018 0445   RDW 14.3 08/20/2017 1144   LYMPHSABS 2.5 12/22/2018 1356   LYMPHSABS 3.1 08/20/2017 1144   MONOABS 0.6 12/22/2018 1356   EOSABS 0.1 12/22/2018 1356    EOSABS 0.3 08/20/2017 1144   BASOSABS 0.0 12/22/2018 1356   BASOSABS 0.0 08/20/2017 1144   BMET    Component Value Date/Time   NA 136 12/28/2018 0445   NA 141 08/20/2017 1144   K 3.2 (L) 12/28/2018 0445   CL 100 12/28/2018 0445   CO2 27 12/28/2018 0445   GLUCOSE 101 (H) 12/28/2018 0445   BUN <5 (L) 12/28/2018 0445   BUN 10 08/20/2017 1144   CREATININE 0.44 12/28/2018 0445   CALCIUM 9.0 12/28/2018 0445   GFRNONAA >60 12/28/2018 0445   GFRAA >60 12/28/2018 0445    and radiology: CT scan: showed free air consistent with postop but no obvious pathology.  Treatments: surgery: ex lap, USO, omentectomy, biopsies.  Discharge Exam: Blood pressure 115/73, pulse 71, temperature 98.7 F (37.1 C), temperature source Oral, resp. rate 18, height 5\' 6"  (1.676 m), weight 166 lb 6.4 oz (75.5 kg), last menstrual period 12/20/2018, SpO2 98 %. General appearance: alert and mild distress GI: soft, non-tender; bowel sounds normal; no masses,  no organomegaly Incision/Wound:  Disposition: Discharge disposition: 01-Home or Self Care       Discharge Instructions    (HEART FAILURE PATIENTS) Call MD:  Anytime you have any of the following symptoms: 1) 3 pound weight gain in 24 hours or 5 pounds in 1 week 2) shortness of breath, with or without a dry hacking cough 3) swelling in the hands, feet or stomach 4) if you have to sleep on  extra pillows at night in order to breathe.   Complete by: As directed    Call MD for:  difficulty breathing, headache or visual disturbances   Complete by: As directed    Call MD for:  extreme fatigue   Complete by: As directed    Call MD for:  hives   Complete by: As directed    Call MD for:  persistant dizziness or light-headedness   Complete by: As directed    Call MD for:  persistant nausea and vomiting   Complete by: As directed    Call MD for:  redness, tenderness, or signs of infection (pain, swelling, redness, odor or green/yellow discharge around incision  site)   Complete by: As directed    Call MD for:  severe uncontrolled pain   Complete by: As directed    Call MD for:  temperature >100.4   Complete by: As directed    Diet - low sodium heart healthy   Complete by: As directed    Diet general   Complete by: As directed    Driving Restrictions   Complete by: As directed    No driving for 7 days or until off narcotic pain medication   Increase activity slowly   Complete by: As directed    Remove dressing in 24 hours   Complete by: As directed    Sexual Activity Restrictions   Complete by: As directed    No intercourse for 6 weeks     Allergies as of 12/28/2018      Reactions   Cheese Shortness Of Breath   Chocolate Shortness Of Breath   Ibuprofen Hives, Shortness Of Breath   Children's ibuprofen   Ivp Dye [iodinated Diagnostic Agents] Hives, Shortness Of Breath, Itching   Latex Anaphylaxis, Swelling, Other (See Comments)   Reaction:  Localized swelling    Orange Juice [orange Oil] Shortness Of Breath   Other Hives, Other (See Comments)   Pt states that she is allergic to all steroids except IV solu-medrol.     Peach [prunus Persica] Anaphylaxis   Peanuts [peanut Oil] Anaphylaxis   Pear Anaphylaxis   Prednisone Hives   Raspberry Anaphylaxis   Tylenol [acetaminophen] Hives, Shortness Of Breath, Other (See Comments)   Pt states that this is only with the liquid form   Amoxicillin Hives, Other (See Comments)   Has patient had a PCN reaction causing immediate rash, facial/tongue/throat swelling, SOB or lightheadedness with hypotension: No Has patient had a PCN reaction causing severe rash involving mucus membranes or skin necrosis: No Has patient had a PCN reaction that required hospitalization: No Has patient had a PCN reaction occurring within the last 10 years: No If all of the above answers are "NO", then may proceed with Cephalosporin use.   Apricot Flavor Hives   Doxycycline Hives   Erythromycin Hives   Milk Of  Magnesia [magnesium Hydroxide] Hives, Itching   Penicillins Hives, Other (See Comments)   Has patient had a PCN reaction causing immediate rash, facial/tongue/throat swelling, SOB or lightheadedness with hypotension: No Has patient had a PCN reaction causing severe rash involving mucus membranes or skin necrosis: No Has patient had a PCN reaction that required hospitalization: No Has patient had a PCN reaction occurring within the last 10 years: No If all of the above answers are "NO", then may proceed with Cephalosporin use.      Medication List    STOP taking these medications   Ibuprofen 200 MG Caps  TAKE these medications   acetaminophen 500 MG tablet Commonly known as: TYLENOL Take 500 mg by mouth every 6 (six) hours as needed.   albuterol 108 (90 Base) MCG/ACT inhaler Commonly known as: VENTOLIN HFA Inhale 2 puffs into the lungs every 4 (four) hours as needed for wheezing or shortness of breath.   cetirizine 10 MG tablet Commonly known as: ZYRTEC Take 1 tablet (10 mg total) by mouth daily.   docusate sodium 50 MG capsule Commonly known as: COLACE Take 1 capsule (50 mg total) by mouth 2 (two) times daily as needed for mild constipation.   dornase alpha 1 MG/ML nebulizer solution Commonly known as: PULMOZYME Take 2.5 mLs (2.5 mg total) by nebulization 2 (two) times daily.   enoxaparin 40 MG/0.4ML injection Commonly known as: LOVENOX Inject 0.4 mLs (40 mg total) into the skin daily for 12 doses.   feeding supplement (ENSURE ENLIVE) Liqd Take 237 mLs by mouth 2 (two) times daily between meals. 474 mL 3 times a day, provide 3-week supply   Fluticasone-Salmeterol 500-50 MCG/DOSE Aepb Commonly known as: ADVAIR Inhale 1 puff into the lungs 2 (two) times daily.   levETIRAcetam 750 MG tablet Commonly known as: Keppra Take 1 tablet (750 mg total) by mouth 2 (two) times daily.   montelukast 10 MG tablet Commonly known as: SINGULAIR Take 10 mg by mouth at bedtime.    ondansetron 8 MG disintegrating tablet Commonly known as: Zofran ODT Take 1 tablet (8 mg total) by mouth every 8 (eight) hours as needed for nausea or vomiting.   oxyCODONE 5 MG immediate release tablet Commonly known as: Oxy IR/ROXICODONE Take 1 tablet (5 mg total) by mouth every 4 (four) hours as needed for severe pain. Do not take and drive   senna-docusate 8.6-50 MG tablet Commonly known as: Senokot-S Take 2 tablets by mouth at bedtime. For AFTER surgery, do not take if having diarrhea   SUMAtriptan 50 MG tablet Commonly known as: IMITREX Take 1 tablet (50 mg total) by mouth daily. May take one more tablet two hours after the first. No more than two tablets per day.   topiramate 50 MG tablet Commonly known as: TOPAMAX Take 1 tablet (50 mg total) by mouth at bedtime.   Vitamin C 100 MG Chew Chew 1 tablet (100 mg total) by mouth daily.      Follow-up Information    Lafonda Mosses, MD Follow up on 01/14/2019.   Specialty: Gynecologic Oncology Why: at 1:15pm at the [facility] information: [place] [id number] [phone]           Signed: Thereasa Solo 12/28/2018, 10:04 AM

## 2018-12-28 NOTE — Plan of Care (Signed)
Pt stable though moderate pain remains. Md aware and will send pt home on appropriate medications.

## 2018-12-28 NOTE — Progress Notes (Signed)
Note: Received a call from CT tech stating they cannot do CT scan if she doesn't take the 13hr prednisone prep because pt is allergic to IV contrast.           Jasmine [name], pt is allergic to prednisone, she spoke with radiologist, states he is comfortable with just the benadryl 1 hour prior to.           CT tech confirmed with radiologist, Dr Nyoka Cowden (rad) does not approve scan with only benadryl            Called Dr Denman George, CT scan canceled for now. CT tech made aware.

## 2018-12-29 ENCOUNTER — Encounter (INDEPENDENT_AMBULATORY_CARE_PROVIDER_SITE_OTHER): Payer: Self-pay | Admitting: Primary Care

## 2018-12-29 ENCOUNTER — Encounter (INDEPENDENT_AMBULATORY_CARE_PROVIDER_SITE_OTHER): Payer: Medicaid Other | Admitting: Primary Care

## 2018-12-29 ENCOUNTER — Encounter (INDEPENDENT_AMBULATORY_CARE_PROVIDER_SITE_OTHER): Payer: Self-pay

## 2018-12-29 ENCOUNTER — Telehealth: Payer: Self-pay

## 2018-12-29 NOTE — Telephone Encounter (Signed)
LM for patient stating that this call was to see how she was doing from her surgery on 12-25-18 and being discharged yesterday to home. Requested that she call back to the office to follow up.

## 2018-12-30 LAB — CYTOLOGY - NON PAP

## 2018-12-30 LAB — SURGICAL PATHOLOGY

## 2018-12-31 ENCOUNTER — Telehealth: Payer: Self-pay | Admitting: Gynecologic Oncology

## 2018-12-31 NOTE — Telephone Encounter (Signed)
Called pt to confirm telephone visit for 12/17. Could not leave a voicemail because mailbox was full

## 2019-01-01 ENCOUNTER — Telehealth: Payer: Self-pay | Admitting: Gynecologic Oncology

## 2019-01-01 ENCOUNTER — Inpatient Hospital Stay (HOSPITAL_BASED_OUTPATIENT_CLINIC_OR_DEPARTMENT_OTHER): Payer: Medicaid Other | Admitting: Gynecologic Oncology

## 2019-01-01 ENCOUNTER — Encounter: Payer: Self-pay | Admitting: Gynecologic Oncology

## 2019-01-01 DIAGNOSIS — R5082 Postprocedural fever: Secondary | ICD-10-CM

## 2019-01-01 DIAGNOSIS — D0739 Carcinoma in situ of other female genital organs: Secondary | ICD-10-CM

## 2019-01-01 DIAGNOSIS — D3912 Neoplasm of uncertain behavior of left ovary: Secondary | ICD-10-CM

## 2019-01-01 DIAGNOSIS — K913 Postprocedural intestinal obstruction, unspecified as to partial versus complete: Secondary | ICD-10-CM

## 2019-01-01 DIAGNOSIS — D099 Carcinoma in situ, unspecified: Secondary | ICD-10-CM | POA: Insufficient documentation

## 2019-01-01 NOTE — Telephone Encounter (Signed)
Called patient's preferred number, and received a message that call could not go through.  Called home number on file.  Gentleman answered the phone and asked if he could take a message for [name].  I let him know who I was and that I was calling early for our scheduled phone visit later.  I will try back midday.  Jeral Pinch MD Gynecologic Oncology

## 2019-01-01 NOTE — Progress Notes (Signed)
Gynecologic Oncology Telehealth Consult Note: Gyn-Onc  I connected with [name] on 01/01/19 at 14:00 EST by telephone and verified that I am speaking with the correct person using two identifiers.  I discussed the limitations, risks, security and privacy concerns of performing an evaluation and management service by telemedicine and the availability of in-person appointments. I also discussed with the patient that there may be a patient responsible charge related to this service. The patient expressed understanding and agreed to proceed.  Other persons participating in the visit and their role in the encounter: none.  Patient's location: Home Provider's location: [facility] [place]  Chief Complaint: Postop, discussion regarding final surgical pathology and treatment planning  Interval History: The patient reports overall doing much better since surgery a week ago.  She notes continued daily improvement of her pain and has been using Tylenol only as needed for pain.  She does not like the way that oxycodone makes her feel so she has been avoiding taking this.  She reports that her appetite is still fairly limited but denies any nausea or emesis.  She is trying to eat crackers and drink Ensure and water.  She feels that she gets full easily.  She is voiding without difficulty.  She denies any flatus or bowel movement since surgery.  She feels some rumbling and as if she needs to pass gas.  She denies any burping.  She denies any fevers but has intermittent chills.  She has not taken her temperature since she has been home from the hospital.  Past Medical/Surgical History: Past Medical History:  Diagnosis Date  . Asthma   . Complication of anesthesia    "I wake up during anesthesia" (10/14/2015)  . DVT (deep vein thrombosis) in pregnancy   . Epilepsy ([place])   . GERD (gastroesophageal reflux disease)   . Heart murmur    last work up age 6- no symtoms  . Pancreatitis   . Sickle  cell trait ([place])   . Sleep apnea    "used to wear CPAP; don't have one here in  since I moved in 2014" (10/14/2015)    Past Surgical History:  Procedure Laterality Date  . APPENDECTOMY    . CESAREAN SECTION  2013  . CESAREAN SECTION N/A 11/08/2014   Procedure: CESAREAN SECTION;  Surgeon: Truett Mainland, DO;  Location: [facility];  Service: Obstetrics;  Laterality: N/A;  . CHOLECYSTECTOMY N/A 10/16/2015   Procedure: LAPAROSCOPIC CHOLECYSTECTOMY WITH POSSIBLE INTRAOPERATIVE CHOLANGIOGRAM;  Surgeon: Donnie Mesa, MD;  Location: [place];  Service: General;  Laterality: N/A;  . CLOSED REDUCTION MANDIBLE WITH MANDIBULOMA N/A 07/17/2017   Procedure: REDUCTION TMJ WITH INTRAMAXILLARY FIXATION;  Surgeon: Diona Browner, DDS;  Location: [place];  Service: Oral Surgery;  Laterality: N/A;  . ESOPHAGOGASTRODUODENOSCOPY (EGD) WITH PROPOFOL N/A 11/15/2016   Procedure: ESOPHAGOGASTRODUODENOSCOPY (EGD) WITH PROPOFOL;  Surgeon: Clarene Essex, MD;  Location: [facility];  Service: Endoscopy;  Laterality: N/A;  . INGUINAL HERNIA REPAIR Bilateral ~ 1996  . NERVE, TENDON AND ARTERY REPAIR Right 09/23/2012   Procedure: I&D and Repair As Necessary/Right Hand and Palm;  Surgeon: Roseanne Kaufman, MD;  Location: [place];  Service: Orthopedics;  Laterality: Right;  . OMENTECTOMY N/A 12/25/2018   Procedure: OMENTECTOMY;  Surgeon: Lafonda Mosses, MD;  Location: [facility];  Service: Gynecology;  Laterality: N/A;  . SALPINGOOPHORECTOMY N/A 12/25/2018   Procedure: OPEN UNILATERAL  SALPINGO OOPHORECTOMY WITH STAGING;  Surgeon: Lafonda Mosses, MD;  Location: [facility];  Service: Gynecology;  Laterality: N/A;  . TONSILLECTOMY      Family History  Problem Relation Age of Onset  . Cancer Mother        cervical cancer  . Asthma Mother   . Hypertension Father   . Sickle cell anemia Father   . Asthma Sister   . Diabetes Maternal Aunt   . Lymphoma Maternal Grandmother     Social History   Socioeconomic History  . Marital  status: Single    Spouse name: Not on file  . Number of children: Not on file  . Years of education: Not on file  . Highest education level: Not on file  Occupational History  . Not on file  Tobacco Use  . Smoking status: Former Smoker    Packs/day: 0.25    Years: 4.00    Pack years: 1.00    Types: Cigarettes    Quit date: 10/02/2015    Years since quitting: 3.2  . Smokeless tobacco: Never Used  Substance and Sexual Activity  . Alcohol use: No    Alcohol/week: 0.0 standard drinks  . Drug use: No  . Sexual activity: Yes    Birth control/protection: None  Other Topics Concern  . Not on file  Social History Narrative  . Not on file   Social Determinants of Health   Financial Resource Strain:   . Difficulty of Paying Living Expenses: Not on file  Food Insecurity:   . Worried About Charity fundraiser in the Last Year: Not on file  . Ran Out of Food in the Last Year: Not on file  Transportation Needs:   . Lack of Transportation (Medical): Not on file  . Lack of Transportation (Non-Medical): Not on file  Physical Activity:   . Days of Exercise per Week: Not on file  . Minutes of Exercise per Session: Not on file  Stress:   . Feeling of Stress : Not on file  Social Connections:   . Frequency of Communication with Friends and Family: Not on file  . Frequency of Social Gatherings with Friends and Family: Not on file  . Attends Religious Services: Not on file  . Active Member of Clubs or Organizations: Not on file  . Attends Archivist Meetings: Not on file  . Marital Status: Not on file    Current Medications:  Current Outpatient Medications:  .  acetaminophen (TYLENOL) 500 MG tablet, Take 500 mg by mouth every 6 (six) hours as needed., Disp: , Rfl:  .  albuterol (PROVENTIL HFA;VENTOLIN HFA) 108 (90 Base) MCG/ACT inhaler, Inhale 2 puffs into the lungs every 4 (four) hours as needed for wheezing or shortness of breath., Disp: 1 Inhaler, Rfl: 3 .  Ascorbic Acid  (VITAMIN C) 100 MG CHEW, Chew 1 tablet (100 mg total) by mouth daily. (Patient not taking: Reported on 12/25/2018), Disp: 90 each, Rfl: 2 .  cetirizine (ZYRTEC) 10 MG tablet, Take 1 tablet (10 mg total) by mouth daily., Disp: 15 tablet, Rfl: 0 .  docusate sodium (COLACE) 50 MG capsule, Take 1 capsule (50 mg total) by mouth 2 (two) times daily as needed for mild constipation. (Patient not taking: Reported on 12/25/2018), Disp: 60 capsule, Rfl: 2 .  dornase alpha (PULMOZYME) 1 MG/ML nebulizer solution, Take 2.5 mLs (2.5 mg total) by nebulization 2 (two) times daily. (Patient not taking: Reported on 12/25/2018), Disp: 75 mL, Rfl: 3 .  enoxaparin (LOVENOX) 40 MG/0.4ML injection, Inject 0.4 mLs (40 mg total) into the skin daily for  12 doses., Disp: 4.8 mL, Rfl: 0 .  feeding supplement, ENSURE ENLIVE, (ENSURE ENLIVE) LIQD, Take 237 mLs by mouth 2 (two) times daily between meals. 474 mL 3 times a day, provide 3-week supply (Patient not taking: Reported on 12/25/2018), Disp: 60 Bottle, Rfl: 3 .  Fluticasone-Salmeterol (ADVAIR) 500-50 MCG/DOSE AEPB, Inhale 1 puff into the lungs 2 (two) times daily., Disp: 1 each, Rfl: 5 .  levETIRAcetam (KEPPRA) 750 MG tablet, Take 1 tablet (750 mg total) by mouth 2 (two) times daily., Disp: 60 tablet, Rfl: 2 .  montelukast (SINGULAIR) 10 MG tablet, Take 10 mg by mouth at bedtime., Disp: , Rfl:  .  ondansetron (ZOFRAN ODT) 8 MG disintegrating tablet, Take 1 tablet (8 mg total) by mouth every 8 (eight) hours as needed for nausea or vomiting., Disp: 20 tablet, Rfl: 0 .  oxyCODONE (OXY IR/ROXICODONE) 5 MG immediate release tablet, Take 1 tablet (5 mg total) by mouth every 4 (four) hours as needed for severe pain. Do not take and drive, Disp: 20 tablet, Rfl: 0 .  senna-docusate (SENOKOT-S) 8.6-50 MG tablet, Take 2 tablets by mouth at bedtime. For AFTER surgery, do not take if having diarrhea, Disp: 60 tablet, Rfl: 1 .  SUMAtriptan (IMITREX) 50 MG tablet, Take 1 tablet (50 mg total)  by mouth daily. May take one more tablet two hours after the first. No more than two tablets per day. (Patient not taking: Reported on 12/25/2018), Disp: 20 tablet, Rfl: 1 .  topiramate (TOPAMAX) 50 MG tablet, Take 1 tablet (50 mg total) by mouth at bedtime. (Patient not taking: Reported on 12/25/2018), Disp: 30 tablet, Rfl: 3  Review of Symptoms: Pertinent positives as per HPI.  Otherwise review of systems negative.  Physical Exam: LMP 12/20/2018 (Approximate)  No vital signs or exam performed given limitations of phone visit.  Patient took her temperature while I was on the phone with her and it was 99.6 F.  Laboratory & Radiologic Studies: Pathology:  SURGICAL PATHOLOGY  CASE: [id number]  PATIENT: Jasmine [name]  Surgical Pathology Report      Clinical History: Pelvic mass (cm)      FINAL MICROSCOPIC DIAGNOSIS:   A. OVARY AND FALLOPIAN TUBE, LEFT, SALPINGECTOMY:  - Mucinous borderline tumor with intraepithelial carcinoma, see comment.   - Benign fallopian tube.  - See oncology table.   B. OMENTUM, OMENTECTOMY:  - Benign fibroadipose tissue with focal fat necrosis, see comment.   C. FALLOPIAN TUBE, LEFT, REMNANT, SALPINGECTOMY:  - Benign fallopian tube.   D. CUL DE SAC, POSTERIOR, BIOPSY:  - Endosalpingiosis.   E. PELVIC SIDE WALL, LEFT, BIOPSY:  - Benign soft tissue.   F. PELVIC SIDE WALL, RIGHT, BIOPSY:  - Benign soft tissue.   G. SMALL BOWEL MESENTERIC NODULE #1, BIOPSY:  - Benign adipose tissue.   H. SMALL BOWEL MESENTERIC NODULE #2, BIOPSY:  - Benign fibroadipose tissue.   I. CUL DE SAC, ANTERIOR, BIOPSY:  - Benign fibroadipose tissue.   J. ENDOMETRIUM, BIOPSY:  - Degenerating secretory endometrium.  - Benign endocervical mucosa.   ONCOLOGY TABLE:   OVARY or FALLOPIAN TUBE or PRIMARY PERITONEUM:   Procedure: Left salpingo-oophorectomy with additional biopsies  Specimen Integrity: Ruptured for cyst drainage during procedure with  spill.   Tumor Site: Left ovary  Ovarian Surface Involvement (required only if applicable): Not  identified  Fallopian Tube Surface Involvement (required only if applicable): Not  identified  Tumor Size: 30 cm  Histologic Type: Mucinous borderline tumor with intraepithelial  carcinoma  Histologic Grade: Not applicable  Implants (required for advanced stage serous/seromucinous borderline  tumors only): Not identified  Other Tissue/ Organ Involvement: Not identified  Largest Extrapelvic Peritoneal Focus (required only if applicable): Not  applicable  Peritoneal/Ascitic Fluid: [id number] negative.  Treatment Effect (required only for high-grade serous carcinomas): No  known presurgical therapy  Regional Lymph Nodes: No lymph nodes submitted or found  Pathologic Stage Classification (pTNM, AJCC 8th Edition): pT1c1, pNX  Representative Tumor Block: A7  Comment(s): There are foci of intraepithelial carcinoma. Definitive  invasion is not seen. There is small focus of fat necrosis in part B,  but no definitive mucin or tumor. Dr. Saralyn Pilar has reviewed the case.   Pelvic washings negative, diaphragm scraping negative  Assessment & Plan: Kaila Binkerd is a 28 y.o. 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  Reviewed the patient's pathology in detail.  Given no invasive carcinoma, we discussed no adjuvant treatment.  The patient will continue to follow with me every 6 months for an exam and CEA.  We will plan to get a pelvic ultrasound every 6 to 12 months until she has completed childbearing.  At that time, I would favor completion surgery (removal of her uterus and other fallopian tube and ovary) given risk of recurrence.  I reviewed the recurrence rate of just under 6% for intraepithelial noninvasive mucinous ovarian cancer, FIGO stage I, and again stressed the importance of continued  surveillance.  From a postoperative standpoint, I suspect that the patient has an ileus.  She has had no bowel function since surgery that she is aware of but is tolerating p.o. intake without nausea or emesis.  I have encouraged her to increase the Senokot to twice a day and if she does not have bowel function by tomorrow, to add either milk of magnesia or MiraLAX.  Someone from our office will call her tomorrow to see how she is doing.  She has a low-grade fever, and I have asked her to continue to check her temperature.  She voiced understanding of calling the clinic (if during business hours) or coming into the emergency department if she has a temperature over 100.2 C.  I discussed the assessment and treatment plan with the patient. The patient was provided with an opportunity to ask questions and all were answered. The patient agreed with the plan and demonstrated an understanding of the instructions.   The patient was advised to call back or see an in-person evaluation if the symptoms worsen or if the condition fails to improve as anticipated.   I provided 15 minutes of non face-to-face telephone visit time during this encounter, and > 50% was spent counseling as documented under my assessment & plan.   Jeral Pinch, MD  Division of Gynecologic Oncology  Department of Obstetrics and Gynecology  [facility]

## 2019-01-02 ENCOUNTER — Telehealth: Payer: Self-pay

## 2019-01-02 NOTE — Telephone Encounter (Signed)
LM for Jasmine [name] to call the office back to she how she is doing today regarding BM,Nausea, and temperature from Dr. Charisse March phone call from 01-01-19.

## 2019-01-02 NOTE — Telephone Encounter (Signed)
Attempted to reach Ms Metayer again. LM as noted below.

## 2019-01-13 NOTE — Progress Notes (Signed)
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  Patient is overall doing very well postoperatively.  Since I had a phone visit with her, her bowel movements have started to normalize.  She initially developed diarrhea and now states her stool has become more solid.  She stopped taking the Senokot secondary to her diarrhea.  She reports her appetite is improving and now gets the sensation of being very hungry, but she continues to get full quickly.  She continues to drink 3 ensures a day and is trying to snack in between as she is able to.  She denies any fevers but has intermittent chills.  She has been taking her temperature at home and notes that it is usually between 98 and 99 F.  The highest temperature she has had was 100 F.  She denies any menstrual bleeding since surgery.  She reports minimal abdominal pain.  Past Medical/Surgical History: Past Medical History:  Diagnosis Date  . Asthma   . Complication of anesthesia    "I wake up during anesthesia" (10/14/2015)  . DVT (deep vein thrombosis) in pregnancy   . Epilepsy ([place])   . GERD (gastroesophageal reflux disease)   . Heart murmur    last work up age 15- no symtoms  . Pancreatitis   . Sickle cell trait ([place])   . Sleep apnea    "used to wear CPAP; don't have one here in [place] since I moved in 2014" (10/14/2015)    Past Surgical History:  Procedure Laterality Date  . APPENDECTOMY    . CESAREAN SECTION  2013  . CESAREAN SECTION N/A 11/08/2014   Procedure: CESAREAN SECTION;  Surgeon: Truett Mainland, DO;  Location: [facility];  Service: Obstetrics;  Laterality: N/A;  . CHOLECYSTECTOMY N/A 10/16/2015   Procedure: LAPAROSCOPIC CHOLECYSTECTOMY WITH POSSIBLE  INTRAOPERATIVE CHOLANGIOGRAM;  Surgeon: Donnie Mesa, MD;  Location: [facility];  Service: General;  Laterality: N/A;  . CLOSED REDUCTION MANDIBLE WITH MANDIBULOMA N/A 07/17/2017   Procedure: REDUCTION TMJ WITH INTRAMAXILLARY FIXATION;  Surgeon: Diona Browner, DDS;  Location: [facility];  Service: Oral Surgery;  Laterality: N/A;  . ESOPHAGOGASTRODUODENOSCOPY (EGD) WITH PROPOFOL N/A 11/15/2016   Procedure: ESOPHAGOGASTRODUODENOSCOPY (EGD) WITH PROPOFOL;  Surgeon: Clarene Essex, MD;  Location: [facility];  Service: Endoscopy;  Laterality: N/A;  . INGUINAL HERNIA REPAIR Bilateral ~ 1996  . NERVE, TENDON AND ARTERY REPAIR Right 09/23/2012   Procedure: I&D and Repair As Necessary/Right Hand and Palm;  Surgeon: Roseanne Kaufman, MD;  Location: [facility];  Service: Orthopedics;  Laterality: Right;  . OMENTECTOMY N/A 12/25/2018   Procedure: OMENTECTOMY;  Surgeon: Lafonda Mosses, MD;  Location: [facility];  Service: Gynecology;  Laterality: N/A;  . SALPINGOOPHORECTOMY N/A 12/25/2018   Procedure: OPEN UNILATERAL  SALPINGO OOPHORECTOMY WITH STAGING;  Surgeon: Lafonda Mosses, MD;  Location: [facility];  Service: Gynecology;  Laterality: N/A;  . TONSILLECTOMY      Family History  Problem Relation Age of Onset  . Cancer Mother        cervical cancer  . Asthma Mother   . Hypertension Father   . Sickle cell anemia Father   . Asthma Sister   . Diabetes Maternal Aunt   . Lymphoma Maternal Grandmother     Social  History   Socioeconomic History  . Marital status: Single    Spouse name: Not on file  . Number of children: Not on file  . Years of education: Not on file  . Highest education level: Not on file  Occupational History  . Not on file  Tobacco Use  . Smoking status: Former Smoker    Packs/day: 0.25    Years: 4.00    Pack years: 1.00    Types: Cigarettes    Quit date: 10/02/2015    Years since quitting: 3.2  . Smokeless tobacco: Never Used  Substance and Sexual Activity  . Alcohol use: No     Alcohol/week: 0.0 standard drinks  . Drug use: No  . Sexual activity: Yes    Birth control/protection: None  Other Topics Concern  . Not on file  Social History Narrative  . Not on file   Social Determinants of Health   Financial Resource Strain:   . Difficulty of Paying Living Expenses: Not on file  Food Insecurity:   . Worried About Charity fundraiser in the Last Year: Not on file  . Ran Out of Food in the Last Year: Not on file  Transportation Needs:   . Lack of Transportation (Medical): Not on file  . Lack of Transportation (Non-Medical): Not on file  Physical Activity:   . Days of Exercise per Week: Not on file  . Minutes of Exercise per Session: Not on file  Stress:   . Feeling of Stress : Not on file  Social Connections:   . Frequency of Communication with Friends and Family: Not on file  . Frequency of Social Gatherings with Friends and Family: Not on file  . Attends Religious Services: Not on file  . Active Member of Clubs or Organizations: Not on file  . Attends Archivist Meetings: Not on file  . Marital Status: Not on file    Current Medications:  Current Outpatient Medications:  .  acetaminophen (TYLENOL) 500 MG tablet, Take 500 mg by mouth every 6 (six) hours as needed., Disp: , Rfl:  .  albuterol (PROVENTIL HFA;VENTOLIN HFA) 108 (90 Base) MCG/ACT inhaler, Inhale 2 puffs into the lungs every 4 (four) hours as needed for wheezing or shortness of breath., Disp: 1 Inhaler, Rfl: 3 .  Ascorbic Acid (VITAMIN C) 100 MG CHEW, Chew 1 tablet (100 mg total) by mouth daily. (Patient not taking: Reported on 12/25/2018), Disp: 90 each, Rfl: 2 .  cetirizine (ZYRTEC) 10 MG tablet, Take 1 tablet (10 mg total) by mouth daily., Disp: 15 tablet, Rfl: 0 .  docusate sodium (COLACE) 50 MG capsule, Take 1 capsule (50 mg total) by mouth 2 (two) times daily as needed for mild constipation. (Patient not taking: Reported on 12/25/2018), Disp: 60 capsule, Rfl: 2 .  dornase alpha  (PULMOZYME) 1 MG/ML nebulizer solution, Take 2.5 mLs (2.5 mg total) by nebulization 2 (two) times daily. (Patient not taking: Reported on 12/25/2018), Disp: 75 mL, Rfl: 3 .  enoxaparin (LOVENOX) 40 MG/0.4ML injection, Inject 0.4 mLs (40 mg total) into the skin daily for 12 doses., Disp: 4.8 mL, Rfl: 0 .  feeding supplement, ENSURE ENLIVE, (ENSURE ENLIVE) LIQD, Take 237 mLs by mouth 2 (two) times daily between meals. 474 mL 3 times a day, provide 3-week supply (Patient not taking: Reported on 12/25/2018), Disp: 60 Bottle, Rfl: 3 .  Fluticasone-Salmeterol (ADVAIR) 500-50 MCG/DOSE AEPB, Inhale 1 puff into the lungs 2 (two) times daily., Disp: 1 each, Rfl: 5 .  levETIRAcetam (KEPPRA) 750 MG tablet, Take 1 tablet (750 mg total) by mouth 2 (two) times daily., Disp: 60 tablet, Rfl: 2 .  montelukast (SINGULAIR) 10 MG tablet, Take 10 mg by mouth at bedtime., Disp: , Rfl:  .  ondansetron (ZOFRAN ODT) 8 MG disintegrating tablet, Take 1 tablet (8 mg total) by mouth every 8 (eight) hours as needed for nausea or vomiting., Disp: 20 tablet, Rfl: 0 .  senna-docusate (SENOKOT-S) 8.6-50 MG tablet, Take 2 tablets by mouth at bedtime. For AFTER surgery, do not take if having diarrhea, Disp: 60 tablet, Rfl: 1 .  SUMAtriptan (IMITREX) 50 MG tablet, Take 1 tablet (50 mg total) by mouth daily. May take one more tablet two hours after the first. No more than two tablets per day. (Patient not taking: Reported on 12/25/2018), Disp: 20 tablet, Rfl: 1 .  topiramate (TOPAMAX) 50 MG tablet, Take 1 tablet (50 mg total) by mouth at bedtime. (Patient not taking: Reported on 12/25/2018), Disp: 30 tablet, Rfl: 3  Review of Symptoms: Pertinent positives as per HPI including change to appetite, chills, and early satiety. Denies fatigue, unexplained weight changes. Denies hearing loss, neck lumps or masses, mouth sores, ringing in ears or voice changes. Denies cough or wheezing.  Denies shortness of breath. Denies chest pain or  palpitations. Denies leg swelling. Denies abdominal distention, pain, blood in stools, constipation, diarrhea, nausea, vomiting. Denies pain with intercourse, dysuria, frequency, hematuria or incontinence. Denies hot flashes, pelvic pain, vaginal bleeding or vaginal discharge.   Denies joint pain, back pain or muscle pain/cramps. Denies itching, rash, or wounds. Denies dizziness, headaches, numbness or seizures. Denies swollen lymph nodes or glands, denies easy bruising or bleeding. Denies anxiety, depression, confusion, or decreased concentration.   Physical Exam: BP 119/75 (BP Location: Left Arm, Patient Position: Sitting)   Pulse 94   Temp 98.4 F (36.9 C) (Temporal)   Resp 18   Ht 5\' 5"  (1.651 m)   Wt 147 lb (66.7 kg)   LMP 12/20/2018 (Approximate)   SpO2 100%   BMI 24.46 kg/m  General: Alert, oriented, no acute distress. HEENT: Posterior oropharynx clear, sclera anicteric. Chest: Clear to auscultation bilaterally.  No wheezes or rhonchi. Cardiovascular: Regular rate and rhythm, no murmurs. Abdomen: soft, nontender.  Normoactive bowel sounds.  No masses or hepatosplenomegaly appreciated.  Well-healed scar. Extremities: Grossly normal range of motion.  Warm, well perfused.  No edema bilaterally. Skin: No rashes or lesions noted. Lymphatics: No cervical, supraclavicular, or inguinal adenopathy. GU: Deferred.  Laboratory & Radiologic Studies: None new  Assessment & Plan: Jasmine [name] is a 28 y.o. 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  Patient is overall doing well and meeting postoperative milestones.  I have encouraged her to continue drinking the ensures and try to increase her oral intake daily.  I suspect that after such a long time with her large mass that it will take some work to increase her appetite again.  We discussed again the diagnosis of borderline tumor.  Although there is not great data to  support close surveillance, I recommend visits every 6 months with a pelvic exam, ultrasound and CEA.  Patient was amenable to this.  We will try to get everything scheduled on the same day for her return visit with me in 6 months.  Jeral Pinch, MD  Division of Gynecologic Oncology  Department of Obstetrics and Gynecology  [facility]

## 2019-01-14 ENCOUNTER — Other Ambulatory Visit: Payer: Self-pay

## 2019-01-14 ENCOUNTER — Inpatient Hospital Stay (HOSPITAL_BASED_OUTPATIENT_CLINIC_OR_DEPARTMENT_OTHER): Payer: Medicaid Other | Admitting: Gynecologic Oncology

## 2019-01-14 ENCOUNTER — Encounter: Payer: Self-pay | Admitting: Gynecologic Oncology

## 2019-01-14 VITALS — BP 119/75 | HR 94 | Temp 98.4°F | Resp 18 | Ht 65.0 in | Wt 147.0 lb

## 2019-01-14 DIAGNOSIS — R6881 Early satiety: Secondary | ICD-10-CM | POA: Diagnosis not present

## 2019-01-14 DIAGNOSIS — Z20828 Contact with and (suspected) exposure to other viral communicable diseases: Secondary | ICD-10-CM | POA: Diagnosis not present

## 2019-01-14 DIAGNOSIS — D3912 Neoplasm of uncertain behavior of left ovary: Secondary | ICD-10-CM | POA: Diagnosis not present

## 2019-01-14 DIAGNOSIS — R110 Nausea: Secondary | ICD-10-CM | POA: Diagnosis not present

## 2019-01-14 DIAGNOSIS — E860 Dehydration: Secondary | ICD-10-CM | POA: Diagnosis not present

## 2019-01-14 DIAGNOSIS — Z87440 Personal history of urinary (tract) infections: Secondary | ICD-10-CM | POA: Diagnosis not present

## 2019-01-14 DIAGNOSIS — R197 Diarrhea, unspecified: Secondary | ICD-10-CM | POA: Diagnosis not present

## 2019-01-14 DIAGNOSIS — R140 Abdominal distension (gaseous): Secondary | ICD-10-CM | POA: Diagnosis not present

## 2019-01-14 DIAGNOSIS — R102 Pelvic and perineal pain: Secondary | ICD-10-CM | POA: Diagnosis not present

## 2019-01-14 DIAGNOSIS — N939 Abnormal uterine and vaginal bleeding, unspecified: Secondary | ICD-10-CM | POA: Diagnosis not present

## 2019-01-14 DIAGNOSIS — D398 Neoplasm of uncertain behavior of other specified female genital organs: Secondary | ICD-10-CM | POA: Diagnosis not present

## 2019-01-14 NOTE — Patient Instructions (Signed)
Your incision looks great!  I am glad you are healing well from surgery.  You can use nail polish remover to get the rest of the glue off your incision.  Remember your lifting, pushing, and pulling restrictions are for 6 weeks to let your incision heal completely.  I will see you in 6 months and will get a pelvic ultrasound and lab test before your appointment with me.  If you develop any new symptoms including bloating or abdominal pain, please call the clinic at [phone] to see me sooner.

## 2019-01-16 DIAGNOSIS — J45901 Unspecified asthma with (acute) exacerbation: Secondary | ICD-10-CM | POA: Diagnosis not present

## 2019-01-16 DIAGNOSIS — G4739 Other sleep apnea: Secondary | ICD-10-CM | POA: Diagnosis not present

## 2019-01-16 DIAGNOSIS — R2689 Other abnormalities of gait and mobility: Secondary | ICD-10-CM | POA: Diagnosis not present

## 2019-01-16 DIAGNOSIS — J45909 Unspecified asthma, uncomplicated: Secondary | ICD-10-CM | POA: Diagnosis not present

## 2019-02-16 DIAGNOSIS — G4739 Other sleep apnea: Secondary | ICD-10-CM | POA: Diagnosis not present

## 2019-02-16 DIAGNOSIS — R2689 Other abnormalities of gait and mobility: Secondary | ICD-10-CM | POA: Diagnosis not present

## 2019-02-16 DIAGNOSIS — J45901 Unspecified asthma with (acute) exacerbation: Secondary | ICD-10-CM | POA: Diagnosis not present

## 2019-02-16 DIAGNOSIS — J45909 Unspecified asthma, uncomplicated: Secondary | ICD-10-CM | POA: Diagnosis not present

## 2019-03-16 DIAGNOSIS — R2689 Other abnormalities of gait and mobility: Secondary | ICD-10-CM | POA: Diagnosis not present

## 2019-03-16 DIAGNOSIS — G4739 Other sleep apnea: Secondary | ICD-10-CM | POA: Diagnosis not present

## 2019-03-16 DIAGNOSIS — J45901 Unspecified asthma with (acute) exacerbation: Secondary | ICD-10-CM | POA: Diagnosis not present

## 2019-03-16 DIAGNOSIS — J45909 Unspecified asthma, uncomplicated: Secondary | ICD-10-CM | POA: Diagnosis not present

## 2019-03-19 ENCOUNTER — Emergency Department (HOSPITAL_COMMUNITY): Payer: Medicaid Other

## 2019-03-19 ENCOUNTER — Encounter (HOSPITAL_COMMUNITY): Payer: Self-pay

## 2019-03-19 ENCOUNTER — Other Ambulatory Visit: Payer: Self-pay

## 2019-03-19 ENCOUNTER — Observation Stay (HOSPITAL_COMMUNITY)
Admission: EM | Admit: 2019-03-19 | Discharge: 2019-03-20 | Disposition: A | Discharge status: Home / Self Care | Payer: Medicaid Other | Attending: Internal Medicine | Admitting: Internal Medicine

## 2019-03-19 DIAGNOSIS — Z20822 Contact with and (suspected) exposure to covid-19: Secondary | ICD-10-CM | POA: Insufficient documentation

## 2019-03-19 DIAGNOSIS — Z03818 Encounter for observation for suspected exposure to other biological agents ruled out: Secondary | ICD-10-CM | POA: Diagnosis not present

## 2019-03-19 DIAGNOSIS — Z90721 Acquired absence of ovaries, unilateral: Secondary | ICD-10-CM | POA: Insufficient documentation

## 2019-03-19 DIAGNOSIS — Z881 Allergy status to other antibiotic agents status: Secondary | ICD-10-CM | POA: Insufficient documentation

## 2019-03-19 DIAGNOSIS — S0011XA Contusion of right eyelid and periocular area, initial encounter: Secondary | ICD-10-CM | POA: Diagnosis not present

## 2019-03-19 DIAGNOSIS — Z86718 Personal history of other venous thrombosis and embolism: Secondary | ICD-10-CM | POA: Diagnosis not present

## 2019-03-19 DIAGNOSIS — G4730 Sleep apnea, unspecified: Secondary | ICD-10-CM | POA: Diagnosis not present

## 2019-03-19 DIAGNOSIS — Z886 Allergy status to analgesic agent status: Secondary | ICD-10-CM | POA: Diagnosis not present

## 2019-03-19 DIAGNOSIS — J309 Allergic rhinitis, unspecified: Secondary | ICD-10-CM | POA: Diagnosis not present

## 2019-03-19 DIAGNOSIS — Z832 Family history of diseases of the blood and blood-forming organs and certain disorders involving the immune mechanism: Secondary | ICD-10-CM | POA: Insufficient documentation

## 2019-03-19 DIAGNOSIS — Z9049 Acquired absence of other specified parts of digestive tract: Secondary | ICD-10-CM | POA: Insufficient documentation

## 2019-03-19 DIAGNOSIS — R569 Unspecified convulsions: Secondary | ICD-10-CM | POA: Diagnosis not present

## 2019-03-19 DIAGNOSIS — D573 Sickle-cell trait: Secondary | ICD-10-CM | POA: Insufficient documentation

## 2019-03-19 DIAGNOSIS — D72829 Elevated white blood cell count, unspecified: Secondary | ICD-10-CM | POA: Diagnosis not present

## 2019-03-19 DIAGNOSIS — Z825 Family history of asthma and other chronic lower respiratory diseases: Secondary | ICD-10-CM | POA: Insufficient documentation

## 2019-03-19 DIAGNOSIS — R531 Weakness: Secondary | ICD-10-CM | POA: Insufficient documentation

## 2019-03-19 DIAGNOSIS — Z880 Allergy status to penicillin: Secondary | ICD-10-CM | POA: Diagnosis not present

## 2019-03-19 DIAGNOSIS — Z833 Family history of diabetes mellitus: Secondary | ICD-10-CM | POA: Insufficient documentation

## 2019-03-19 DIAGNOSIS — F419 Anxiety disorder, unspecified: Secondary | ICD-10-CM | POA: Diagnosis not present

## 2019-03-19 DIAGNOSIS — K219 Gastro-esophageal reflux disease without esophagitis: Secondary | ICD-10-CM | POA: Diagnosis not present

## 2019-03-19 DIAGNOSIS — Z87891 Personal history of nicotine dependence: Secondary | ICD-10-CM | POA: Diagnosis not present

## 2019-03-19 DIAGNOSIS — G43909 Migraine, unspecified, not intractable, without status migrainosus: Secondary | ICD-10-CM | POA: Insufficient documentation

## 2019-03-19 DIAGNOSIS — F329 Major depressive disorder, single episode, unspecified: Secondary | ICD-10-CM | POA: Insufficient documentation

## 2019-03-19 DIAGNOSIS — E876 Hypokalemia: Secondary | ICD-10-CM | POA: Insufficient documentation

## 2019-03-19 DIAGNOSIS — Z807 Family history of other malignant neoplasms of lymphoid, hematopoietic and related tissues: Secondary | ICD-10-CM | POA: Insufficient documentation

## 2019-03-19 DIAGNOSIS — G40909 Epilepsy, unspecified, not intractable, without status epilepticus: Secondary | ICD-10-CM | POA: Diagnosis not present

## 2019-03-19 DIAGNOSIS — Z8049 Family history of malignant neoplasm of other genital organs: Secondary | ICD-10-CM | POA: Insufficient documentation

## 2019-03-19 DIAGNOSIS — R000 Tachycardia, unspecified: Secondary | ICD-10-CM | POA: Insufficient documentation

## 2019-03-19 DIAGNOSIS — Z91010 Allergy to peanuts: Secondary | ICD-10-CM | POA: Insufficient documentation

## 2019-03-19 DIAGNOSIS — Z9114 Patient's other noncompliance with medication regimen: Secondary | ICD-10-CM | POA: Insufficient documentation

## 2019-03-19 DIAGNOSIS — D509 Iron deficiency anemia, unspecified: Secondary | ICD-10-CM | POA: Insufficient documentation

## 2019-03-19 DIAGNOSIS — R400 Somnolence: Secondary | ICD-10-CM | POA: Insufficient documentation

## 2019-03-19 DIAGNOSIS — Z91018 Allergy to other foods: Secondary | ICD-10-CM | POA: Diagnosis not present

## 2019-03-19 DIAGNOSIS — Z888 Allergy status to other drugs, medicaments and biological substances status: Secondary | ICD-10-CM | POA: Diagnosis not present

## 2019-03-19 DIAGNOSIS — J4540 Moderate persistent asthma, uncomplicated: Secondary | ICD-10-CM | POA: Diagnosis present

## 2019-03-19 DIAGNOSIS — Z7951 Long term (current) use of inhaled steroids: Secondary | ICD-10-CM | POA: Insufficient documentation

## 2019-03-19 DIAGNOSIS — Z8249 Family history of ischemic heart disease and other diseases of the circulatory system: Secondary | ICD-10-CM | POA: Insufficient documentation

## 2019-03-19 DIAGNOSIS — Z79899 Other long term (current) drug therapy: Secondary | ICD-10-CM | POA: Insufficient documentation

## 2019-03-19 DIAGNOSIS — Z7901 Long term (current) use of anticoagulants: Secondary | ICD-10-CM | POA: Insufficient documentation

## 2019-03-19 DIAGNOSIS — D649 Anemia, unspecified: Secondary | ICD-10-CM | POA: Diagnosis present

## 2019-03-19 LAB — COMPREHENSIVE METABOLIC PANEL
ALT: 15 U/L (ref 0–44)
AST: 34 U/L (ref 15–41)
Albumin: 5 g/dL (ref 3.5–5.0)
Alkaline Phosphatase: 69 U/L (ref 38–126)
Anion gap: 20 — ABNORMAL HIGH (ref 5–15)
BUN: 5 mg/dL — ABNORMAL LOW (ref 6–20)
CO2: 15 mmol/L — ABNORMAL LOW (ref 22–32)
Calcium: 9.3 mg/dL (ref 8.9–10.3)
Chloride: 102 mmol/L (ref 98–111)
Creatinine, Ser: 0.98 mg/dL (ref 0.44–1.00)
GFR calc Af Amer: >60 mL/min (ref >60)
GFR calc non Af Amer: >60 mL/min (ref >60)
Glucose, Bld: 150 mg/dL — ABNORMAL HIGH (ref 70–99)
Potassium: 2.7 mmol/L — CL (ref 3.5–5.1)
Sodium: 137 mmol/L (ref 135–145)
Total Bilirubin: 0.4 mg/dL (ref 0.3–1.2)
Total Protein: 8.9 g/dL — ABNORMAL HIGH (ref 6.5–8.1)

## 2019-03-19 LAB — HCG, QUANTITATIVE, PREGNANCY: hCG, Beta Chain, Quant, S: 1 m[IU]/mL (ref <5)

## 2019-03-19 LAB — CBC WITH DIFFERENTIAL/PLATELET
Abs Immature Granulocytes: 0.13 10*3/uL — ABNORMAL HIGH (ref 0.00–0.07)
Basophils Absolute: 0.1 10*3/uL (ref 0.0–0.1)
Basophils Relative: 0 %
Eosinophils Absolute: 0 10*3/uL (ref 0.0–0.5)
Eosinophils Relative: 0 %
HCT: 40.3 % (ref 36.0–46.0)
Hemoglobin: 11.5 g/dL — ABNORMAL LOW (ref 12.0–15.0)
Immature Granulocytes: 1 %
Lymphocytes Relative: 22 %
Lymphs Abs: 5 10*3/uL — ABNORMAL HIGH (ref 0.7–4.0)
MCH: 22.8 pg — ABNORMAL LOW (ref 26.0–34.0)
MCHC: 28.5 g/dL — ABNORMAL LOW (ref 30.0–36.0)
MCV: 79.8 fL — ABNORMAL LOW (ref 80.0–100.0)
Monocytes Absolute: 0.8 10*3/uL (ref 0.1–1.0)
Monocytes Relative: 3 %
Neutro Abs: 17.2 10*3/uL — ABNORMAL HIGH (ref 1.7–7.7)
Neutrophils Relative %: 74 %
Platelets: 402 10*3/uL — ABNORMAL HIGH (ref 150–400)
RBC: 5.05 MIL/uL (ref 3.87–5.11)
RDW: 18.9 % — ABNORMAL HIGH (ref 11.5–15.5)
WBC: 23.2 10*3/uL — ABNORMAL HIGH (ref 4.0–10.5)
nRBC: 0 % (ref 0.0–0.2)

## 2019-03-19 LAB — MAGNESIUM: Magnesium: 2.4 mg/dL (ref 1.7–2.4)

## 2019-03-19 LAB — RESPIRATORY PANEL BY RT PCR (FLU A&B, COVID)
Influenza A by PCR: NEGATIVE
Influenza B by PCR: NEGATIVE
SARS Coronavirus 2 by RT PCR: NEGATIVE

## 2019-03-19 MED ORDER — LORAZEPAM 2 MG/ML IJ SOLN
1.0000 mg | Freq: Once | INTRAMUSCULAR | Status: DC
  Filled 2019-03-19: qty 1

## 2019-03-19 MED ORDER — ENOXAPARIN SODIUM 40 MG/0.4ML ~~LOC~~ SOLN: 40.0000 mg | SUBCUTANEOUS | Status: DC

## 2019-03-19 MED ORDER — LORAZEPAM 2 MG/ML IJ SOLN
INTRAMUSCULAR | Status: AC
  Administered 2019-03-19: 2 mg
  Filled 2019-03-19: qty 1

## 2019-03-19 MED ORDER — ALBUTEROL SULFATE (2.5 MG/3ML) 0.083% IN NEBU: 3.0000 mL | INHALATION_SOLUTION | RESPIRATORY_TRACT | Status: DC | PRN

## 2019-03-19 MED ORDER — LORAZEPAM 2 MG/ML IJ SOLN
1.0000 mg | INTRAMUSCULAR | Status: DC | PRN
  Administered 2019-03-19: 1 mg via INTRAVENOUS
  Filled 2019-03-19: qty 1

## 2019-03-19 MED ORDER — LORAZEPAM 2 MG/ML IJ SOLN
1.0000 mg | Freq: Once | INTRAMUSCULAR | Status: AC
  Administered 2019-03-19: 1 mg via INTRAVENOUS
  Filled 2019-03-19: qty 1

## 2019-03-19 MED ORDER — POTASSIUM CHLORIDE 10 MEQ/100ML IV SOLN
10.0000 meq | INTRAVENOUS | Status: AC
  Administered 2019-03-19 – 2019-03-20 (×2): 10 meq via INTRAVENOUS
  Filled 2019-03-19 (×2): qty 100

## 2019-03-19 MED ORDER — POTASSIUM CHLORIDE 10 MEQ/100ML IV SOLN
10.0000 meq | Freq: Once | INTRAVENOUS | Status: AC
  Administered 2019-03-19: 10 meq via INTRAVENOUS
  Filled 2019-03-19: qty 100

## 2019-03-19 MED ORDER — CHLORHEXIDINE GLUCONATE CLOTH 2 % EX PADS: 6.0000 | MEDICATED_PAD | Freq: Every day | CUTANEOUS | Status: DC

## 2019-03-19 MED ORDER — LORAZEPAM 2 MG/ML IJ SOLN
1.0000 mg | Freq: Once | INTRAMUSCULAR | Status: AC
  Administered 2019-03-19: 1 mg via INTRAVENOUS

## 2019-03-19 MED ORDER — LORAZEPAM 2 MG/ML IJ SOLN
INTRAMUSCULAR | Status: AC
  Filled 2019-03-19: qty 1

## 2019-03-19 MED ORDER — CARBAMAZEPINE 200 MG PO TABS
500.0000 mg | ORAL_TABLET | Freq: Once | ORAL | Status: DC
  Filled 2019-03-19: qty 2.5

## 2019-03-19 MED ORDER — LORAZEPAM 2 MG/ML IJ SOLN: 1.0000 mg | INTRAMUSCULAR | Status: DC | PRN

## 2019-03-19 MED ORDER — LEVETIRACETAM IN NACL 500 MG/100ML IV SOLN
500.0000 mg | Freq: Two times a day (BID) | INTRAVENOUS | Status: DC
  Administered 2019-03-20: 500 mg via INTRAVENOUS
  Filled 2019-03-19: qty 100

## 2019-03-19 MED ORDER — HALOPERIDOL LACTATE 5 MG/ML IJ SOLN
10.0000 mg | Freq: Once | INTRAMUSCULAR | Status: AC
  Administered 2019-03-19: 10 mg via INTRAMUSCULAR
  Filled 2019-03-19: qty 2

## 2019-03-19 MED ORDER — LEVETIRACETAM IN NACL 500 MG/100ML IV SOLN
500.0000 mg | Freq: Once | INTRAVENOUS | Status: AC
  Administered 2019-03-19: 500 mg via INTRAVENOUS
  Filled 2019-03-19: qty 100

## 2019-03-19 MED ORDER — MOMETASONE FURO-FORMOTEROL FUM 200-5 MCG/ACT IN AERO
2.0000 | INHALATION_SPRAY | Freq: Two times a day (BID) | RESPIRATORY_TRACT | Status: DC
  Filled 2019-03-19: qty 8.8

## 2019-03-19 MED ORDER — SODIUM CHLORIDE 0.9% FLUSH
3.0000 mL | Freq: Two times a day (BID) | INTRAVENOUS | Status: DC
  Administered 2019-03-19: 3 mL via INTRAVENOUS

## 2019-03-19 MED ORDER — HALOPERIDOL LACTATE 5 MG/ML IJ SOLN
5.0000 mg | Freq: Once | INTRAMUSCULAR | Status: AC | PRN
  Administered 2019-03-19: 5 mg via INTRAMUSCULAR
  Filled 2019-03-19: qty 1

## 2019-03-19 MED ORDER — LEVETIRACETAM IN NACL 1000 MG/100ML IV SOLN
1000.0000 mg | Freq: Once | INTRAVENOUS | Status: AC
  Administered 2019-03-19: 1000 mg via INTRAVENOUS
  Filled 2019-03-19: qty 100

## 2019-03-19 MED ORDER — MONTELUKAST SODIUM 10 MG PO TABS: 10.0000 mg | ORAL_TABLET | Freq: Every day | ORAL | Status: DC

## 2019-03-19 NOTE — Progress Notes (Signed)
Patient pended to ICU/SD as stepdown status.  Briefly reviewed electronic record to assess for appropriate nursing assignment.  For best nursing assignment patient would be favored to be assigned to room 1232.  Room currently being cleaned.  Environmental services advised 10-15 more minutes until complete.  Notified AC to see if this would be ok.  AC advised yes.  Called to ED, and advised they could call report to the nurse [facility] @ [phone], and advised by the time report was called room should be cleaned.  Jacqulyn Ducking ICU/SD RN4 / Care Coordinator / Rapid Response Nurse Rapid Response Number:  [phone] ICU Charge Nurse Number:  [phone]

## 2019-03-19 NOTE — ED Notes (Addendum)
Pt found to be sitting on the floor. Pt assisted back to the bed. She was able to stand and assist [place]. Hower[place], DO made aware.

## 2019-03-19 NOTE — ED Provider Notes (Signed)
[facility] DEPT Provider Note   CSN: [id number] Arrival date & time: 03/19/19  1704     History Chief Complaint  Patient presents with  . Seizures  . Fall    Jasmine [name] is a 29 y.o. female.  Patient was at the triage desk when she had a seizure.  When she awoke she was very combative.  Eventually I was able to get a history from her where she stated that she has not had her seizure medicine in a week.  She stated she had had a seizure today.  Patient then went and had another seizure in the emergency department.  The history is provided by the patient and a caregiver.  Seizures Seizure activity on arrival: yes   Seizure type:  Grand mal Initial focality:  None Episode characteristics: abnormal movements   Postictal symptoms: confusion   Return to baseline: no   Severity:  Moderate Timing:  Clustered Progression:  Unchanged Context: not alcohol withdrawal   Fall       Past Medical History:  Diagnosis Date  . Asthma   . Complication of anesthesia    "I wake up during anesthesia" (10/14/2015)  . DVT (deep vein thrombosis) in pregnancy   . Epilepsy ([place])   . GERD (gastroesophageal reflux disease)   . Heart murmur    last work up age 20- no symtoms  . Pancreatitis   . Sickle cell trait ([place])   . Sleep apnea    "used to wear CPAP; don't have one here in [place] since I moved in 2014" (10/14/2015)    Patient Active Problem List   Diagnosis Date Noted  . 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 Pelvic mass in female 12/25/2018  . Abnormal uterine bleeding   . Pelvic mass 12/22/2018  . Dehydration 12/22/2018  . Asthma exacerbation 12/01/2017  . Acute hypercapnic respiratory failure ([place]) 12/01/2017  . Exocrine pancreatic insufficiency 12/01/2017  . Facial contusion 07/17/2017  . Closed dislocation of right jaw 07/17/2017  . Nausea with hives 07/09/2017  . History of DVT (deep  vein thrombosis) in pregnancy ([place]) 05/29/2017  . Sickle cell trait ([place]) 05/29/2017  . Acute asthma exacerbation 05/29/2017  . Chronic pancreatitis ([place]) 05/29/2017  . Seizures ([place]) 05/29/2017  . Tobacco abuse 05/29/2017  . Acute on chronic respiratory failure with hypoxemia ([place]) 05/29/2017  . SOB (shortness of breath) 04/08/2017  . Cough 04/07/2017  . 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 Acute on chronic respiratory failure with hypoxia ([place]) 03/10/2017  . Colitis 11/12/2016  . Seizure ([place]) 11/12/2016  . Calculus of bile duct with acute cholecystitis with obstruction 10/14/2015  . Adjustment disorder with mixed anxiety and depressed mood 04/26/2015  . Low blood potassium 04/19/2015  . Asthma with acute exacerbation 04/19/2015  . Asthma 01/03/2015  . Asthma, chronic, unspecified asthma severity, with acute exacerbation 01/03/2015  . Influenza-like illness 01/03/2015  . Status post cesarean section 11/08/2014  . Previous cesarean section complicating pregnancy, antepartum condition or complication   . GERD (gastroesophageal reflux disease)   . Gastroesophageal reflux disease without esophagitis   . Abnormal biochemical finding on antenatal screening of mother   . Abnormal quad screen   . Echogenic focus of heart of fetus affecting antepartum care of mother   . Drug use affecting pregnancy, antepartum 05/19/2014  . Seizure disorder during pregnancy, antepartum ([place]) 05/13/2014  . Supervision of high risk pregnancy, antepartum 05/13/2014  . Asthma complicating pregnancy,  antepartum 05/13/2014  . History of food anaphylaxis 05/13/2014  . Seizure disorder, sept 2015 last seizure 01/19/2013  . Anemia 01/19/2013    Past Surgical History:  Procedure Laterality Date  . APPENDECTOMY    . CESAREAN SECTION  2013  . CESAREAN SECTION N/A 11/08/2014   Procedure: CESAREAN SECTION;  Surgeon: Truett Mainland, DO;  Location: [facility];  Service: Obstetrics;  Laterality: N/A;  . CHOLECYSTECTOMY  N/A 10/16/2015   Procedure: LAPAROSCOPIC CHOLECYSTECTOMY WITH POSSIBLE INTRAOPERATIVE CHOLANGIOGRAM;  Surgeon: Donnie Mesa, MD;  Location: [facility];  Service: General;  Laterality: N/A;  . CLOSED REDUCTION MANDIBLE WITH MANDIBULOMA N/A 07/17/2017   Procedure: REDUCTION TMJ WITH INTRAMAXILLARY FIXATION;  Surgeon: Diona Browner, DDS;  Location: [facility];  Service: Oral Surgery;  Laterality: N/A;  . ESOPHAGOGASTRODUODENOSCOPY (EGD) WITH PROPOFOL N/A 11/15/2016   Procedure: ESOPHAGOGASTRODUODENOSCOPY (EGD) WITH PROPOFOL;  Surgeon: Clarene Essex, MD;  Location: [facility];  Service: Endoscopy;  Laterality: N/A;  . INGUINAL HERNIA REPAIR Bilateral ~ 1996  . NERVE, TENDON AND ARTERY REPAIR Right 09/23/2012   Procedure: I&D and Repair As Necessary/Right Hand and Palm;  Surgeon: Roseanne Kaufman, MD;  Location: [facility];  Service: Orthopedics;  Laterality: Right;  . OMENTECTOMY N/A 12/25/2018   Procedure: OMENTECTOMY;  Surgeon: Lafonda Mosses, MD;  Location: [facility];  Service: Gynecology;  Laterality: N/A;  . SALPINGOOPHORECTOMY N/A 12/25/2018   Procedure: OPEN UNILATERAL  SALPINGO OOPHORECTOMY WITH STAGING;  Surgeon: Lafonda Mosses, MD;  Location: [facility];  Service: Gynecology;  Laterality: N/A;  . TONSILLECTOMY       OB History    Gravida  3   Para  3   Term  3   Preterm      AB      Living  3     SAB      TAB      Ectopic      Multiple  0   Live Births  3           Family History  Problem Relation Age of Onset  . Cancer Mother        cervical cancer  . Asthma Mother   . Hypertension Father   . Sickle cell anemia Father   . Asthma Sister   . Diabetes Maternal Aunt   . Lymphoma Maternal Grandmother     Social History   Tobacco Use  . Smoking status: Former Smoker    Packs/day: 0.25    Years: 4.00    Pack years: 1.00    Types: Cigarettes    Quit date: 10/02/2015    Years since quitting: 3.4  . Smokeless tobacco: Never Used  Substance Use Topics  . Alcohol use: No     Alcohol/week: 0.0 standard drinks  . Drug use: No    Home Medications Prior to Admission medications   Medication Sig Start Date End Date Taking? Authorizing Provider  acetaminophen (TYLENOL) 500 MG tablet Take 500 mg by mouth every 6 (six) hours as needed.    [provider]  albuterol (PROVENTIL HFA;VENTOLIN HFA) 108 (90 Base) MCG/ACT inhaler Inhale 2 puffs into the lungs every 4 (four) hours as needed for wheezing or shortness of breath. 08/20/17   Clent Demark, PA-C  Ascorbic Acid (VITAMIN C) 100 MG CHEW Chew 1 tablet (100 mg total) by mouth daily. Patient not taking: Reported on 12/25/2018 08/20/17   Clent Demark, PA-C  cetirizine (ZYRTEC) 10 MG tablet Take 1 tablet (10 mg total) by  mouth daily. 12/11/17   Tasia Catchings, Amy V, PA-C  docusate sodium (COLACE) 50 MG capsule Take 1 capsule (50 mg total) by mouth 2 (two) times daily as needed for mild constipation. Patient not taking: Reported on 12/25/2018 08/20/17   Clent Demark, PA-C  dornase alpha (PULMOZYME) 1 MG/ML nebulizer solution Take 2.5 mLs (2.5 mg total) by nebulization 2 (two) times daily. Patient not taking: Reported on 12/25/2018 08/20/17   Clent Demark, PA-C  enoxaparin (LOVENOX) 40 MG/0.4ML injection Inject 0.4 mLs (40 mg total) into the skin daily for 12 doses. 12/27/18 01/08/19  Joylene John D, NP  feeding supplement, ENSURE ENLIVE, (ENSURE ENLIVE) LIQD Take 237 mLs by mouth 2 (two) times daily between meals. 474 mL 3 times a day, provide 3-week supply Patient not taking: Reported on 12/25/2018 08/20/17   Clent Demark, PA-C  Fluticasone-Salmeterol (ADVAIR) 500-50 MCG/DOSE AEPB Inhale 1 puff into the lungs 2 (two) times daily. 08/20/17   Clent Demark, PA-C  levETIRAcetam (KEPPRA) 750 MG tablet Take 1 tablet (750 mg total) by mouth 2 (two) times daily. 08/20/17   Clent Demark, PA-C  montelukast (SINGULAIR) 10 MG tablet Take 10 mg by mouth at bedtime.    [provider]  ondansetron  (ZOFRAN ODT) 8 MG disintegrating tablet Take 1 tablet (8 mg total) by mouth every 8 (eight) hours as needed for nausea or vomiting. 12/28/18   Everitt Amber, MD  senna-docusate (SENOKOT-S) 8.6-50 MG tablet Take 2 tablets by mouth at bedtime. For AFTER surgery, do not take if having diarrhea 12/26/18   Cross, Lenna Sciara D, NP  SUMAtriptan (IMITREX) 50 MG tablet Take 1 tablet (50 mg total) by mouth daily. May take one more tablet two hours after the first. No more than two tablets per day. Patient not taking: Reported on 12/25/2018 01/06/18   Gildardo Pounds, NP  topiramate (TOPAMAX) 50 MG tablet Take 1 tablet (50 mg total) by mouth at bedtime. Patient not taking: Reported on 12/25/2018 01/06/18 02/27/18  Gildardo Pounds, NP    Allergies    Cheese, Chocolate, Ibuprofen, Ivp dye [iodinated diagnostic agents], Latex, Orange juice [orange oil], Other, Peach [prunus persica], Peanuts [peanut oil], Pear, Prednisone, Raspberry, Tylenol [acetaminophen], Amoxicillin, Apricot flavor, Doxycycline, Erythromycin, Milk of magnesia [magnesium hydroxide], and Penicillins  Review of Systems   Review of Systems  Unable to perform ROS: Mental status change  Neurological: Positive for seizures.    Physical Exam Updated Vital Signs BP (!) 102/58   Pulse 90   Temp 98.7 F (37.1 C) (Oral)   Resp 16   SpO2 100%   Physical Exam Vitals and nursing note reviewed.  Constitutional:      Appearance: She is well-developed.  HENT:     Head: Normocephalic.     Nose: Nose normal.  Eyes:     General: No scleral icterus.    Conjunctiva/sclera: Conjunctivae normal.  Neck:     Thyroid: No thyromegaly.  Cardiovascular:     Rate and Rhythm: Normal rate and regular rhythm.     Heart sounds: No murmur. No friction rub. No gallop.   Pulmonary:     Breath sounds: No stridor. No wheezing or rales.  Chest:     Chest wall: No tenderness.  Abdominal:     General: There is no distension.     Tenderness: There is no  abdominal tenderness. There is no rebound.  Musculoskeletal:        General: Normal range of motion.  Cervical back: Neck supple.  Lymphadenopathy:     Cervical: No cervical adenopathy.  Skin:    Findings: No erythema or rash.  Neurological:     Mental Status: She is alert.     Motor: No abnormal muscle tone.     Coordination: Coordination normal.     Comments: Patient was oriented to person and place but confused on situation  Psychiatric:        Behavior: Behavior normal.     ED Results / Procedures / Treatments   Labs (all labs ordered are listed, but only abnormal results are displayed) Labs Reviewed  CBC WITH DIFFERENTIAL/PLATELET - Abnormal; Notable for the following components:      Result Value   WBC 23.2 (*)    Hemoglobin 11.5 (*)    MCV 79.8 (*)    MCH 22.8 (*)    MCHC 28.5 (*)    RDW 18.9 (*)    Platelets 402 (*)    Neutro Abs 17.2 (*)    Lymphs Abs 5.0 (*)    Abs Immature Granulocytes 0.13 (*)    All other components within normal limits  COMPREHENSIVE METABOLIC PANEL - Abnormal; Notable for the following components:   Potassium 2.7 (*)    CO2 15 (*)    Glucose, Bld 150 (*)    BUN 5 (*)    Total Protein 8.9 (*)    Anion gap 20 (*)    All other components within normal limits  RESPIRATORY PANEL BY RT PCR (FLU A&B, COVID)  URINALYSIS, ROUTINE W REFLEX MICROSCOPIC  RAPID URINE DRUG SCREEN, HOSP PERFORMED  HCG, QUANTITATIVE, PREGNANCY  MAGNESIUM    EKG None  Radiology CT Head Wo Contrast  Result Date: 03/19/2019 CLINICAL DATA:  Seizure, abnormal neuro exam. Neck trauma, uncomplicated. Additional history provided: Patient with reported tonic clonic seizure in waiting room fell and struck head EXAM: CT HEAD WITHOUT CONTRAST CT CERVICAL SPINE WITHOUT CONTRAST TECHNIQUE: Multidetector CT imaging of the head and cervical spine was performed following the standard protocol without intravenous contrast. Multiplanar CT image reconstructions of the cervical  spine were also generated. COMPARISON:  Head CT 06/13/2017, radiographs of the cervical spine 04/19/2016 FINDINGS: CT HEAD FINDINGS Brain: There is no evidence of acute intracranial hemorrhage, intracranial mass, midline shift or extra-axial fluid collection.No demarcated cortical infarction. Cerebral volume is normal for age. Vascular: No hyperdense vessel. Skull: Normal. Negative for fracture or focal lesion. Sinuses/Orbits: Visualized orbits demonstrate no acute abnormality. Redemonstrated left frontal sinus mucous retention cyst. No significant mastoid effusion. Other: Right forehead/periorbital periorbital hematoma. CT CERVICAL SPINE FINDINGS Alignment: Straightening of the expected cervical lordosis. No significant spondylolisthesis. Skull base and vertebrae: The basion-dental and atlanto-dental intervals are maintained.No evidence of acute fracture to the cervical spine. Soft tissues and spinal canal: No prevertebral fluid or swelling. No visible canal hematoma. Disc levels: No significant bony spinal canal or neural foraminal narrowing at any level. Upper chest: No consolidation within the imaged lung apices. Biapical paraseptal emphysema with biapical blebs. IMPRESSION: CT head: 1. Unremarkable noncontrast CT appearance of the brain. No evidence of acute intracranial abnormality. 2. Right forehead/periorbital hematoma. 3. Redemonstrated left frontal sinus mucous retention cyst. CT cervical spine: 1. No evidence of acute fracture to the cervical spine. 2. The imaged lungs demonstrate biapical paraseptal emphysema with biapical blebs. Electronically Signed   By: Kellie Simmering DO   On: 03/19/2019 18:09   CT Cervical Spine Wo Contrast  Result Date: 03/19/2019 CLINICAL DATA:  Seizure, abnormal neuro exam.  Neck trauma, uncomplicated. Additional history provided: Patient with reported tonic clonic seizure in waiting room fell and struck head EXAM: CT HEAD WITHOUT CONTRAST CT CERVICAL SPINE WITHOUT CONTRAST  TECHNIQUE: Multidetector CT imaging of the head and cervical spine was performed following the standard protocol without intravenous contrast. Multiplanar CT image reconstructions of the cervical spine were also generated. COMPARISON:  Head CT 06/13/2017, radiographs of the cervical spine 04/19/2016 FINDINGS: CT HEAD FINDINGS Brain: There is no evidence of acute intracranial hemorrhage, intracranial mass, midline shift or extra-axial fluid collection.No demarcated cortical infarction. Cerebral volume is normal for age. Vascular: No hyperdense vessel. Skull: Normal. Negative for fracture or focal lesion. Sinuses/Orbits: Visualized orbits demonstrate no acute abnormality. Redemonstrated left frontal sinus mucous retention cyst. No significant mastoid effusion. Other: Right forehead/periorbital periorbital hematoma. CT CERVICAL SPINE FINDINGS Alignment: Straightening of the expected cervical lordosis. No significant spondylolisthesis. Skull base and vertebrae: The basion-dental and atlanto-dental intervals are maintained.No evidence of acute fracture to the cervical spine. Soft tissues and spinal canal: No prevertebral fluid or swelling. No visible canal hematoma. Disc levels: No significant bony spinal canal or neural foraminal narrowing at any level. Upper chest: No consolidation within the imaged lung apices. Biapical paraseptal emphysema with biapical blebs. IMPRESSION: CT head: 1. Unremarkable noncontrast CT appearance of the brain. No evidence of acute intracranial abnormality. 2. Right forehead/periorbital hematoma. 3. Redemonstrated left frontal sinus mucous retention cyst. CT cervical spine: 1. No evidence of acute fracture to the cervical spine. 2. The imaged lungs demonstrate biapical paraseptal emphysema with biapical blebs. Electronically Signed   By: Kellie Simmering DO   On: 03/19/2019 18:09   DG Chest Port 1 View  Result Date: 03/19/2019 CLINICAL DATA:  Weakness.  Seizure EXAM: PORTABLE CHEST 1 VIEW  COMPARISON:  10/03/2018 FINDINGS: The heart size and mediastinal contours are within normal limits. Both lungs are clear. The visualized skeletal structures are unremarkable. IMPRESSION: No active disease. Electronically Signed   By: Kerby Moors M.D.   On: 03/19/2019 19:25    Procedures Procedures (including critical care time)  Medications Ordered in ED Medications  LORazepam (ATIVAN) injection 1 mg (0 mg Intravenous Hold 03/19/19 1900)  LORazepam (ATIVAN) 2 MG/ML injection (has no administration in time range)  potassium chloride 10 mEq in 100 mL IVPB (has no administration in time range)  potassium chloride 10 mEq in 100 mL IVPB (10 mEq Intravenous New Bag/Given 03/19/19 1942)  carbamazepine (TEGRETOL) tablet 500 mg (has no administration in time range)  haloperidol lactate (HALDOL) injection 10 mg (10 mg Intramuscular Given 03/19/19 1730)  LORazepam (ATIVAN) injection 1 mg (1 mg Intravenous Given 03/19/19 1930)  levETIRAcetam (KEPPRA) IVPB 1000 mg/100 mL premix (1,000 mg Intravenous New Bag/Given 03/19/19 1955)  levETIRAcetam (KEPPRA) IVPB 500 mg/100 mL premix (500 mg Intravenous New Bag/Given 03/19/19 1956)  LORazepam (ATIVAN) 2 MG/ML injection (2 mg  Given 03/19/19 1949)    ED Course  I have reviewed the triage vital signs and the nursing notes.  Pertinent labs & imaging results that were available during my care of the patient were reviewed by me and considered in my medical decision making (see chart for details).    MDM Rules/Calculators/A&P                      CRITICAL CARE Performed by: Milton Ferguson Total critical care time: 45 minutes Critical care time was exclusive of separately billable procedures and treating other patients. Critical care was necessary to treat or prevent imminent  or life-threatening deterioration. Critical care was time spent personally by me on the following activities: development of treatment plan with patient and/or surrogate as well as nursing,  discussions with consultants, evaluation of patient's response to treatment, examination of patient, obtaining history from patient or surrogate, ordering and performing treatments and interventions, ordering and review of laboratory studies, ordering and review of radiographic studies, pulse oximetry and re-evaluation of patient's condition. Patient had 2 seizures in the emergency department.  And it was determined that she had not been taking her Tegretol.  I spoke with neurology Dr. Katherine Roan and he suggested giving the patient 1500 mg of Keppra IV and 500 mg of Tegretol through an NG tube if possible.  He also stated when she gets back to where she can swallow that she should be placed back on her normal seizure medicines.  He stated the hospitalist can consult neurology if necessary Final Clinical Impression(s) / ED Diagnoses Final diagnoses:  Seizure [facility])    Rx / DC Orders ED Discharge Orders    None       Milton Ferguson, MD 03/19/19 2041

## 2019-03-19 NOTE — ED Notes (Signed)
Date and time results received: 03/19/19 7:00 PM  (use smartphrase ".now" to insert current time)  Test: Potassium Critical Value: 2.7  Name of Provider Notified: [place]  Orders Received? Or Actions Taken?: Orders Received - See Orders for details

## 2019-03-19 NOTE — H&P (Signed)
History and Physical    PLEASE NOTE THAT DRAGON DICTATION SOFTWARE WAS USED IN THE CONSTRUCTION OF THIS NOTE.   Jasmine [name] [id number] DOB: 01/09/1991 DOA: 03/19/2019  PCP: Clent Demark, PA-C Patient coming from: home   I have personally briefly reviewed patient's old medical records in [place]  Chief Complaint: Seizures  HPI: Jasmine [name] is a 29 y.o. female with medical history significant for seizure disorder, moderate persistent asthma, allergic rhinitis, chronic anemia associated with baseline hemoglobin 9-12, who is admitted to [facility] on 03/19/2019 with seizures after presenting from home to [facility] Emergency Department for evaluation of seizure episode at home.   The following history was provided by the emergency department physician as well as via chart review.  The patient presented to [facility] long urgency department today for evaluation of a reported general tonic-clonic seizure that occurred at home earlier today, although the details regarding duration of the tonic-clonic activity as well as timeframe of any ensuing postictal state are not currently clear to me.  While going through the registration process in the emergency department, the patient demonstrated evidence of generalized tonic-clonic activity over the course of approximately 1 minute during which time she reportedly hit her head multiple times on the floor.  It is unclear to me if this episode was associated with a tongue biting or loss of bowel/bladder function. following conclusion of this generalized tonic-clonic activity, the patient reportedly entered a postictal state during which she was nonverbal, unable to follow instructions, with this state lasting approximately 10 to 15.  Following this 10-15-minute interval, the patient's mental status improved sufficiently that she was able to discuss her case with the emergency department physician at which time she conveyed  that she had previously been on [place] as her sole antiepileptic agent for several years before Keppra was discontinued in favor of Tegretol a few months, although the rationale for this transition is currently unclear to me.  The patient reported initial outstanding compliance with her home Tegretol, during which time she reports no seizure-like activity.  However, approximately 1 week ago, the patient reported that she ran out of her Tegretol and was completely off this medication over the ensuing 1 week leading up to today, at which time she reportedly experienced the previously described neurolysed tonic-clonic activity at home earlier today.   Shortly after these conversations with the emergency department physician, the patient reportedly became agitated and combative, and due to concern for this activity rendering her as a potential harm to herself, the patient received Haldol 10 mg IM x1 as well as Ativan 2 mg IV x1.  Later during her emergency department course, the patient reportedly demonstrated evidence of another episode of generalized tonic-clonic activity, lasting approximately 1 minute again followed by a period of somnolence, diminished responsiveness to verbal stimuli, and inability to follow instructions.  The patient did receive Ativan 1 mg IV as his most recent generalized tonic-clonic activity appeared to be diminishing.    Additional ED Course:  Vital signs in the ED were notable for the following: Temperature max 98.7; heart rate 90-1 02; blood pressure 102/58-120 7/80; respiratory rate 16-20; oxygen saturation 100% on room air.  Labs were notable for the following: CMP notable for the following: Sodium 137, potassium 2.7, bicarbonate 15, anion gap 20, creatinine 0.98, glucose 150, calcium 9.3, albumin 5, alkaline phosphatase 69 AST 34, ALT 15, and total bilirubin 0.4.  CBC notable for white blood cell count of 2300 with  74% neutrophils, hemoglobin 11.5 associated with microcytic,  hypochromic findings as well as an increased RDW, all relative to most recent prior hemoglobin data point of 9.3 on 12/28/2018, platelets 402.  Urinalysis, urinary drug screen, and quantitative beta-hCG were ordered, with results pending at this time.  Additionally, routine screening nasopharyngeal COVID-19 PCR was performed, with result currently pending.  Chest x-ray showed no evidence of acute cardiopulmonary process.  In the setting of the patient reportedly hitting her head as a component of her initial generalized tonic-clonic activity observed in the emergency department, CT of the head and cervical spine were obtained, with CT head showing no evidence of acute intracranial process, including no evidence of intracranial hemorrhage, while demonstrating a right forehead hematoma, while CT of the cervical spine showed no evidence of acute fracture.  The patient's case was discussed with the on-call neurologist, Dr. Katherine Roan, who recommended loading the patient with Keppra 1500 mg IV x 1 now given her current inability to take her outpatient Tegretol, which is about IV alternative. Dr. Katherine Roan recommended resumption of outpatient PO Tegretol when safely after to administer this oral medication and introduced the idea of considering placement of NG tube to administer next dose of such versus waiting for current postictal state to resolve to resume this p.o. medication.  Dr. Katherine Roan conveys that neurology is available for further recommendations upon formal consult, but at this time is not planning to evaluate the patient in the morning.   While in the ED, the following were administered: In addition to the previously described Haldol 10 mg IM x1 and total of 3 mg of IV Ativan, the patient also received Keppra with 1500 mg IV x1 as well as potassium chloride 20 equivalents IV over 2 hours x 1.    Review of Systems: As per HPI otherwise 10 point review of systems negative.   Past Medical  History:  Diagnosis Date   Asthma    Complication of anesthesia    "I wake up during anesthesia" (10/14/2015)   DVT (deep vein thrombosis) in pregnancy    Epilepsy ([place])    GERD (gastroesophageal reflux disease)    Heart murmur    last work up age 64- no symtoms   Pancreatitis    Sickle cell trait ([place])    Sleep apnea    "used to wear CPAP; don't have one here in [place] since I moved in 2014" (10/14/2015)    Past Surgical History:  Procedure Laterality Date   APPENDECTOMY     CESAREAN SECTION  2013   CESAREAN SECTION N/A 11/08/2014   Procedure: CESAREAN SECTION;  Surgeon: Truett Mainland, DO;  Location: [facility];  Service: Obstetrics;  Laterality: N/A;   CHOLECYSTECTOMY N/A 10/16/2015   Procedure: LAPAROSCOPIC CHOLECYSTECTOMY WITH POSSIBLE INTRAOPERATIVE CHOLANGIOGRAM;  Surgeon: Donnie Mesa, MD;  Location: [facility];  Service: General;  Laterality: N/A;   CLOSED REDUCTION MANDIBLE WITH MANDIBULOMA N/A 07/17/2017   Procedure: REDUCTION TMJ WITH INTRAMAXILLARY FIXATION;  Surgeon: Diona Browner, DDS;  Location: [facility];  Service: Oral Surgery;  Laterality: N/A;   ESOPHAGOGASTRODUODENOSCOPY (EGD) WITH PROPOFOL N/A 11/15/2016   Procedure: ESOPHAGOGASTRODUODENOSCOPY (EGD) WITH PROPOFOL;  Surgeon: Clarene Essex, MD;  Location: [facility];  Service: Endoscopy;  Laterality: N/A;   INGUINAL HERNIA REPAIR Bilateral ~ 1996   NERVE, TENDON AND ARTERY REPAIR Right 09/23/2012   Procedure: I&D and Repair As Necessary/Right Hand and Palm;  Surgeon: Roseanne Kaufman, MD;  Location: [facility];  Service: Orthopedics;  Laterality: Right;   OMENTECTOMY  N/A 12/25/2018   Procedure: OMENTECTOMY;  Surgeon: Lafonda Mosses, MD;  Location: [facility];  Service: Gynecology;  Laterality: N/A;   SALPINGOOPHORECTOMY N/A 12/25/2018   Procedure: OPEN UNILATERAL  SALPINGO OOPHORECTOMY WITH STAGING;  Surgeon: Lafonda Mosses, MD;  Location: [facility];  Service: Gynecology;  Laterality: N/A;   TONSILLECTOMY       Social History:  reports that she quit smoking about 3 years ago. Her smoking use included cigarettes. She has a 1.00 pack-year smoking history. She has never used smokeless tobacco. She reports that she does not drink alcohol or use drugs.   Allergies  Allergen Reactions   Cheese Shortness Of Breath   Chocolate Shortness Of Breath   Ibuprofen Hives and Shortness Of Breath    Children's ibuprofen   Ivp Dye [Iodinated Diagnostic Agents] Hives, Shortness Of Breath and Itching   Latex Anaphylaxis, Swelling and Other (See Comments)    Reaction:  Localized swelling    Orange Juice [Orange Oil] Shortness Of Breath   Other Hives and Other (See Comments)    Pt states that she is allergic to all steroids except IV solu-medrol.     Peach [Prunus Persica] Anaphylaxis   Peanuts [Peanut Oil] Anaphylaxis   Pear Anaphylaxis   Prednisone Hives   Raspberry Anaphylaxis   Tylenol [Acetaminophen] Hives, Shortness Of Breath and Other (See Comments)    Pt states that this is only with the liquid form   Amoxicillin Hives and Other (See Comments)    Has patient had a PCN reaction causing immediate rash, facial/tongue/throat swelling, SOB or lightheadedness with hypotension: No Has patient had a PCN reaction causing severe rash involving mucus membranes or skin necrosis: No Has patient had a PCN reaction that required hospitalization: No Has patient had a PCN reaction occurring within the last 10 years: No If all of the above answers are "NO", then may proceed with Cephalosporin use.   Apricot Flavor Hives   Doxycycline Hives   Erythromycin Hives   Milk Of Magnesia [Magnesium Hydroxide] Hives and Itching   Penicillins Hives and Other (See Comments)    Has patient had a PCN reaction causing immediate rash, facial/tongue/throat swelling, SOB or lightheadedness with hypotension: No Has patient had a PCN reaction causing severe rash involving mucus membranes or skin necrosis: No Has  patient had a PCN reaction that required hospitalization: No Has patient had a PCN reaction occurring within the last 10 years: No If all of the above answers are "NO", then may proceed with Cephalosporin use.    Family History  Problem Relation Age of Onset   Cancer Mother        cervical cancer   Asthma Mother    Hypertension Father    Sickle cell anemia Father    Asthma Sister    Diabetes Maternal Aunt    Lymphoma Maternal Grandmother      Prior to Admission medications   Medication Sig Start Date End Date Taking? Authorizing Provider  acetaminophen (TYLENOL) 500 MG tablet Take 500 mg by mouth every 6 (six) hours as needed.    [provider]  albuterol (PROVENTIL HFA;VENTOLIN HFA) 108 (90 Base) MCG/ACT inhaler Inhale 2 puffs into the lungs every 4 (four) hours as needed for wheezing or shortness of breath. 08/20/17   Clent Demark, PA-C  Ascorbic Acid (VITAMIN C) 100 MG CHEW Chew 1 tablet (100 mg total) by mouth daily. Patient not taking: Reported on 12/25/2018 08/20/17   Clent Demark, PA-C  cetirizine (  ZYRTEC) 10 MG tablet Take 1 tablet (10 mg total) by mouth daily. 12/11/17   Tasia Catchings, Amy V, PA-C  docusate sodium (COLACE) 50 MG capsule Take 1 capsule (50 mg total) by mouth 2 (two) times daily as needed for mild constipation. Patient not taking: Reported on 12/25/2018 08/20/17   Clent Demark, PA-C  dornase alpha (PULMOZYME) 1 MG/ML nebulizer solution Take 2.5 mLs (2.5 mg total) by nebulization 2 (two) times daily. Patient not taking: Reported on 12/25/2018 08/20/17   Clent Demark, PA-C  enoxaparin (LOVENOX) 40 MG/0.4ML injection Inject 0.4 mLs (40 mg total) into the skin daily for 12 doses. 12/27/18 01/08/19  Joylene John D, NP  feeding supplement, ENSURE ENLIVE, (ENSURE ENLIVE) LIQD Take 237 mLs by mouth 2 (two) times daily between meals. 474 mL 3 times a day, provide 3-week supply Patient not taking: Reported on 12/25/2018 08/20/17   Clent Demark,  PA-C  Fluticasone-Salmeterol (ADVAIR) 500-50 MCG/DOSE AEPB Inhale 1 puff into the lungs 2 (two) times daily. 08/20/17   Clent Demark, PA-C  levETIRAcetam (KEPPRA) 750 MG tablet Take 1 tablet (750 mg total) by mouth 2 (two) times daily. 08/20/17   Clent Demark, PA-C  montelukast (SINGULAIR) 10 MG tablet Take 10 mg by mouth at bedtime.    [provider]  ondansetron (ZOFRAN ODT) 8 MG disintegrating tablet Take 1 tablet (8 mg total) by mouth every 8 (eight) hours as needed for nausea or vomiting. 12/28/18   Everitt Amber, MD  senna-docusate (SENOKOT-S) 8.6-50 MG tablet Take 2 tablets by mouth at bedtime. For AFTER surgery, do not take if having diarrhea 12/26/18   Cross, Lenna Sciara D, NP  SUMAtriptan (IMITREX) 50 MG tablet Take 1 tablet (50 mg total) by mouth daily. May take one more tablet two hours after the first. No more than two tablets per day. Patient not taking: Reported on 12/25/2018 01/06/18   Gildardo Pounds, NP  topiramate (TOPAMAX) 50 MG tablet Take 1 tablet (50 mg total) by mouth at bedtime. Patient not taking: Reported on 12/25/2018 01/06/18 02/27/18  Gildardo Pounds, NP    Objective    Physical Exam: Vitals:   03/19/19 1741 03/19/19 1901  BP: 127/80 (!) 102/58  Pulse: (!) 102 90  Resp: 20 16  Temp: 98.7 F (37.1 C)   TempSrc: Oral   SpO2: 100% 100%    General: appears to be stated age; somnolent, will briefly open eyes to verbal stimuli, unable to follow instructions. Skin: warm, dry, no rash Head: Evidence of abrasion over right forehead Mouth:  Oral mucosa membranes appear moist, normal dentition Neck: supple; trachea midline Heart:  RRR; did not appreciate any M/R/G Lungs: CTAB, did not appreciate any wheezes, rales, or rhonchi Abdomen: + BS; soft, ND,  Vascular: 2+ pedal pulses b/l; 2+ radial pulses b/l Extremities: no peripheral edema, no muscle wasting Neuro: In the context of the patient's current inability to follow instructions, unable to  perform full neurologic assessment, including unable to perform full assessment of strength, sensation, or cranial nerves.  Patient noted to be spontaneously moving all 4 extremities.  No tremors.    Labs on Admission: I have personally reviewed following labs and imaging studies  CBC: Recent Labs  Lab 03/19/19 1723  WBC 23.2*  NEUTROABS 17.2*  HGB 11.5*  HCT 40.3  MCV 79.8*  PLT [id number]*   Basic Metabolic Panel: Recent Labs  Lab 03/19/19 1723  NA 137  K 2.7*  CL 102  CO2 15*  GLUCOSE 150*  BUN 5*  CREATININE 0.98  CALCIUM 9.3   GFR: CrCl cannot be calculated (Unknown ideal weight.). Liver Function Tests: Recent Labs  Lab 03/19/19 1723  AST 34  ALT 15  ALKPHOS 69  BILITOT 0.4  PROT 8.9*  ALBUMIN 5.0   No results for input(s): LIPASE, AMYLASE in the last 168 hours. No results for input(s): AMMONIA in the last 168 hours. Coagulation Profile: No results for input(s): INR, PROTIME in the last 168 hours. Cardiac Enzymes: No results for input(s): CKTOTAL, CKMB, CKMBINDEX, TROPONINI in the last 168 hours. BNP (last 3 results) No results for input(s): PROBNP in the last 8760 hours. HbA1C: No results for input(s): HGBA1C in the last 72 hours. CBG: No results for input(s): GLUCAP in the last 168 hours. Lipid Profile: No results for input(s): CHOL, HDL, LDLCALC, TRIG, CHOLHDL, LDLDIRECT in the last 72 hours. Thyroid Function Tests: No results for input(s): TSH, T4TOTAL, FREET4, T3FREE, THYROIDAB in the last 72 hours. Anemia Panel: No results for input(s): VITAMINB12, FOLATE, FERRITIN, TIBC, IRON, RETICCTPCT in the last 72 hours. Urine analysis:    Component Value Date/Time   COLORURINE STRAW (A) 12/04/2018 2046   APPEARANCEUR CLEAR 12/04/2018 2046   LABSPEC 1.015 12/04/2018 2046   PHURINE 6.0 12/04/2018 2046   GLUCOSEU NEGATIVE 12/04/2018 2046   HGBUR MODERATE (A) 12/04/2018 2046   BILIRUBINUR NEGATIVE 12/04/2018 2046   [place] 12/04/2018 2046    PROTEINUR NEGATIVE 12/04/2018 2046   UROBILINOGEN 1.0 11/04/2014 0946   NITRITE NEGATIVE 12/04/2018 2046   LEUKOCYTESUR NEGATIVE 12/04/2018 2046    Radiological Exams on Admission: CT Head Wo Contrast  Result Date: 03/19/2019 CLINICAL DATA:  Seizure, abnormal neuro exam. Neck trauma, uncomplicated. Additional history provided: Patient with reported tonic clonic seizure in waiting room fell and struck head EXAM: CT HEAD WITHOUT CONTRAST CT CERVICAL SPINE WITHOUT CONTRAST TECHNIQUE: Multidetector CT imaging of the head and cervical spine was performed following the standard protocol without intravenous contrast. Multiplanar CT image reconstructions of the cervical spine were also generated. COMPARISON:  Head CT 06/13/2017, radiographs of the cervical spine 04/19/2016 FINDINGS: CT HEAD FINDINGS Brain: There is no evidence of acute intracranial hemorrhage, intracranial mass, midline shift or extra-axial fluid collection.No demarcated cortical infarction. Cerebral volume is normal for age. Vascular: No hyperdense vessel. Skull: Normal. Negative for fracture or focal lesion. Sinuses/Orbits: Visualized orbits demonstrate no acute abnormality. Redemonstrated left frontal sinus mucous retention cyst. No significant mastoid effusion. Other: Right forehead/periorbital periorbital hematoma. CT CERVICAL SPINE FINDINGS Alignment: Straightening of the expected cervical lordosis. No significant spondylolisthesis. Skull base and vertebrae: The basion-dental and atlanto-dental intervals are maintained.No evidence of acute fracture to the cervical spine. Soft tissues and spinal canal: No prevertebral fluid or swelling. No visible canal hematoma. Disc levels: No significant bony spinal canal or neural foraminal narrowing at any level. Upper chest: No consolidation within the imaged lung apices. Biapical paraseptal emphysema with biapical blebs. IMPRESSION: CT head: 1. Unremarkable noncontrast CT appearance of the brain. No  evidence of acute intracranial abnormality. 2. Right forehead/periorbital hematoma. 3. Redemonstrated left frontal sinus mucous retention cyst. CT cervical spine: 1. No evidence of acute fracture to the cervical spine. 2. The imaged lungs demonstrate biapical paraseptal emphysema with biapical blebs. Electronically Signed   By: Kellie Simmering DO   On: 03/19/2019 18:09   CT Cervical Spine Wo Contrast  Result Date: 03/19/2019 CLINICAL DATA:  Seizure, abnormal neuro exam. Neck trauma, uncomplicated. Additional history provided: Patient with reported tonic clonic seizure  in waiting room fell and struck head EXAM: CT HEAD WITHOUT CONTRAST CT CERVICAL SPINE WITHOUT CONTRAST TECHNIQUE: Multidetector CT imaging of the head and cervical spine was performed following the standard protocol without intravenous contrast. Multiplanar CT image reconstructions of the cervical spine were also generated. COMPARISON:  Head CT 06/13/2017, radiographs of the cervical spine 04/19/2016 FINDINGS: CT HEAD FINDINGS Brain: There is no evidence of acute intracranial hemorrhage, intracranial mass, midline shift or extra-axial fluid collection.No demarcated cortical infarction. Cerebral volume is normal for age. Vascular: No hyperdense vessel. Skull: Normal. Negative for fracture or focal lesion. Sinuses/Orbits: Visualized orbits demonstrate no acute abnormality. Redemonstrated left frontal sinus mucous retention cyst. No significant mastoid effusion. Other: Right forehead/periorbital periorbital hematoma. CT CERVICAL SPINE FINDINGS Alignment: Straightening of the expected cervical lordosis. No significant spondylolisthesis. Skull base and vertebrae: The basion-dental and atlanto-dental intervals are maintained.No evidence of acute fracture to the cervical spine. Soft tissues and spinal canal: No prevertebral fluid or swelling. No visible canal hematoma. Disc levels: No significant bony spinal canal or neural foraminal narrowing at any level.  Upper chest: No consolidation within the imaged lung apices. Biapical paraseptal emphysema with biapical blebs. IMPRESSION: CT head: 1. Unremarkable noncontrast CT appearance of the brain. No evidence of acute intracranial abnormality. 2. Right forehead/periorbital hematoma. 3. Redemonstrated left frontal sinus mucous retention cyst. CT cervical spine: 1. No evidence of acute fracture to the cervical spine. 2. The imaged lungs demonstrate biapical paraseptal emphysema with biapical blebs. Electronically Signed   By: Kellie Simmering DO   On: 03/19/2019 18:09   DG Chest Port 1 View  Result Date: 03/19/2019 CLINICAL DATA:  Weakness.  Seizure EXAM: PORTABLE CHEST 1 VIEW COMPARISON:  10/03/2018 FINDINGS: The heart size and mediastinal contours are within normal limits. Both lungs are clear. The visualized skeletal structures are unremarkable. IMPRESSION: No active disease. Electronically Signed   By: Kerby Moors M.D.   On: 03/19/2019 19:25     Assessment/Plan   Shalayne Bleicher is a 29 y.o. female with medical history significant for seizure disorder, moderate persistent asthma, allergic rhinitis, chronic anemia associated with baseline hemoglobin 9-12, who is admitted to [facility] on 03/19/2019 with seizures after presenting from home to [facility] Emergency Department for evaluation of seizure episode at home.    Principal Problem:   Seizures ([place]) Active Problems:   Anemia   Leukocytosis   Hypokalemia   Moderate persistent asthma   #) Seizures: In the context of a documented history of seizure disorder, the patient reportedly has experienced 3 episodes of generalized tonic-clonic seizures today, with first episode reportedly occurring at home, with last 2 occurring in the emergency department.  The 2 episodes occurring in the ED were witnessed, and reported to be consistent with generalized tonic-clonic activity lasting for approximately 1 minute and associated with prolonged  postictal periods.  Imaging the first and second episodes of seizure-like activity in the ED, the ED physician was able to discuss the presentation with the patient, following resolution of initial postictal, at which time the patient reportedly conveyed that she has not taken her outpatient Tegretol in approximately 1 week due to running out of this medication.  In the absence of additional underlying organic source for these breakthrough seizures, suspect that her presenting generalized tonic-clonic seizures on the basis of suboptimal compliance with outpatient antiepileptic regimen.  In considering other potential contributing factors leading to today seizures, there is no evidence of associated hypoglycemia, hyponatremia, or hypocalcemia.  Clinically, no evidence  of underlying infectious process, although results urinalysis and COVID-19 PCR remain pending.  We will also check urinary drug screen.  Following several candid episode of generalized tonic-clonic seizure activity in the ED, the patient remains postictal at this time and unable to take p.o. on this basis.   The patient's case was discussed with the on-call neurologist, Dr. Katherine Roan, who recommended loading the patient with Keppra 1500 mg IV x 1 now given her current inability to take her outpatient Tegretol, which is about IV alternative. Dr. Katherine Roan recommended resumption of outpatient PO Tegretol when safely after to administer this oral medication and introduced the idea of considering placement of NG tube to administer next dose of such versus waiting for current postictal state to resolve to resume this p.o. medication.  As inability to take p.o. would represent a barrier to discharge, there dose not appear to be much to gain by administering Tegretol via NG tube when she is still unable to safely take subsequent doses of this medication in the absence of NG administration, particularly given that the aforementioned noting dose of IV Keppra  should achieve supratherapeutic levels to prevent additional seizures even in the absence of benign resumption of Tegretol.  Consequently, we will refrain from NG tube placement and resumption of Tegretol until the patient's postictal state is resolved and she is able to follow instructions and safely take oral Tegretol.  In the meantime I have ordered Keppra 500 mg IV twice daily, with first dose to occur tomorrow morning, with plan to discontinue this medication in favor of Tegretol once the patient is able to resume oral intake. Dr. Katherine Roan conveys that neurology is available for further recommendations upon formal consult, but at this time is not planning to evaluate the patient in the morning.   Plan: In the setting of current postictal state, will maintain n.p.o. status.  Hold Tegretol until able to resume oral intake. Keppra 500 mg IV BID until able to safely resume Tegretol, at which Keppra be discontinued.  I have placed an inpatient pharmacy consult for their assistance in reconciling most recent dose of outpatient Tegretol.  Seizure precautions.  Monitor on telemetry.  Check urinary drug screen, urinalysis.  Repeat CMP and CBC in the morning.  As needed IV Ativan for breakthrough seizures.  Will check prolactin level to further confirm suspected legitimacy of presenting seizures.  Subsequent to resolution of current postictal state, will plan to counsel the patient on the importance of improved compliance with outpatient antiepileptic regimen.     #) Hypokalemia: Present labs reflect serum potassium of 2.7.  She has received 20 equivalents of IV potassium in the ED.   Plan: We will provide an initial 20 mEq of IV potassium chloride over 2 hours x 1 now, with repeat CMP in the morning.  Add on serum magnesium level.  Monitor on telemetry.     #) Leukocytosis: Associated with neutrophil predominance, which I suspect is reactive in nature in the setting of 3 generalized tonic-clonic  seizures occurring earlier today as opposed to representing an underlying infectious process, although elements of infectious work-up remain pending at this time, including urinalysis as well as result of COVID-19 PCR.  The patient has been afebrile, and possesses no SIRS criteria at this time.  Chest x-ray showed no evidence of acute cardiopulmonary process.   Plan: Monitor for results of urinalysis as well as COVID-19 PCR.  Repeat CBC with differential in the morning.     #) Moderate persistent asthma: Outpatient  respiratory regimen consists of Advair, Singulair and as needed albuterol inhaler.  No evidence of acute asthma exacerbation at this time.  Via chart review, it appears that the patient is a former smoker, having completely quit smoking in 2017.  Plan: Add on serum magnesium level.  Continue home Advair, singular, as well as as needed albuterol inhaler.      #) Allergic rhinitis: On cetirizine as an outpatient.  Plan: Currently holding cetirizine in the setting of current n.p.o. status.      #) Chronic anemia: While the patient has a documented history of chronic anemia with a baseline hemoglobin of 9-12, but not yet encountered qualification of the underlying etiology leading to this chronic anemia.  Per presenting labs today hemoglobin noted to be within baseline range 11.5, however presenting anemia appears to be associated with microcytic hyperchromic findings as well as an elevated RDW suggestive of potential underlying iron deficiency anemia, which would appear to represent a new diagnosis for this patient.  Consequently, I will add on iron studies to labs already collected in the ED.  No evidence of acute bleed at this time.  Not on any blood thinning agents as an outpatient.  Plan: Check ferritin, total iron, TIBC.  We will also check INR.  Repeat CBC in the morning.    DVT prophylaxis: Lovenox 40 mg subcu daily Code Status: Full code Family Communication:  none Disposition Plan: Per Rounding Team Consults called: Patient's case was discussed with the on-call neurologist, Dr. Katherine Roan, as further described above.  Admission status: Observation.    PLEASE NOTE THAT DRAGON DICTATION SOFTWARE WAS USED IN THE CONSTRUCTION OF THIS NOTE.   [place] Pager [phone] From [place].   Otherwise, please contact night-coverage  [email or web address] Password [facility]  03/19/2019, 8:06 PM

## 2019-03-19 NOTE — ED Notes (Signed)
Registration called staff out to the lobby, patient had a seizure while being registered, witnessed seizure, hit head on floor multiple times, blood in mouth noted. Patient appears postictal.

## 2019-03-19 NOTE — ED Triage Notes (Signed)
Pt had tonic clonic seizure in waiting room fell and struck head, became agitated in post ictal state, combative and uncooperative, Pt placed in soft restraints for safety of Pt and staff, Given Haldol IM.

## 2019-03-20 DIAGNOSIS — E876 Hypokalemia: Secondary | ICD-10-CM | POA: Diagnosis not present

## 2019-03-20 DIAGNOSIS — R569 Unspecified convulsions: Secondary | ICD-10-CM | POA: Diagnosis not present

## 2019-03-20 DIAGNOSIS — D72829 Elevated white blood cell count, unspecified: Secondary | ICD-10-CM | POA: Diagnosis not present

## 2019-03-20 DIAGNOSIS — J4540 Moderate persistent asthma, uncomplicated: Secondary | ICD-10-CM | POA: Diagnosis not present

## 2019-03-20 LAB — CBC WITH DIFFERENTIAL/PLATELET
Abs Immature Granulocytes: 0.08 10*3/uL — ABNORMAL HIGH (ref 0.00–0.07)
Basophils Absolute: 0 10*3/uL (ref 0.0–0.1)
Basophils Relative: 0 %
Eosinophils Absolute: 0 10*3/uL (ref 0.0–0.5)
Eosinophils Relative: 0 %
HCT: 32.2 % — ABNORMAL LOW (ref 36.0–46.0)
Hemoglobin: 9.2 g/dL — ABNORMAL LOW (ref 12.0–15.0)
Immature Granulocytes: 0 %
Lymphocytes Relative: 13 %
Lymphs Abs: 2.5 10*3/uL (ref 0.7–4.0)
MCH: 22.8 pg — ABNORMAL LOW (ref 26.0–34.0)
MCHC: 28.6 g/dL — ABNORMAL LOW (ref 30.0–36.0)
MCV: 79.7 fL — ABNORMAL LOW (ref 80.0–100.0)
Monocytes Absolute: 1 10*3/uL (ref 0.1–1.0)
Monocytes Relative: 5 %
Neutro Abs: 15.9 10*3/uL — ABNORMAL HIGH (ref 1.7–7.7)
Neutrophils Relative %: 82 %
Platelets: 294 10*3/uL (ref 150–400)
RBC: 4.04 MIL/uL (ref 3.87–5.11)
RDW: 18.5 % — ABNORMAL HIGH (ref 11.5–15.5)
WBC: 19.4 10*3/uL — ABNORMAL HIGH (ref 4.0–10.5)
nRBC: 0 % (ref 0.0–0.2)

## 2019-03-20 LAB — COMPREHENSIVE METABOLIC PANEL
ALT: 16 U/L (ref 0–44)
AST: 33 U/L (ref 15–41)
Albumin: 3.9 g/dL (ref 3.5–5.0)
Alkaline Phosphatase: 49 U/L (ref 38–126)
Anion gap: 7 (ref 5–15)
BUN: <5 mg/dL — ABNORMAL LOW (ref 6–20)
CO2: 19 mmol/L — ABNORMAL LOW (ref 22–32)
Calcium: 8.5 mg/dL — ABNORMAL LOW (ref 8.9–10.3)
Chloride: 112 mmol/L — ABNORMAL HIGH (ref 98–111)
Creatinine, Ser: 0.52 mg/dL (ref 0.44–1.00)
GFR calc Af Amer: >60 mL/min (ref >60)
GFR calc non Af Amer: >60 mL/min (ref >60)
Glucose, Bld: 98 mg/dL (ref 70–99)
Potassium: 3.9 mmol/L (ref 3.5–5.1)
Sodium: 138 mmol/L (ref 135–145)
Total Bilirubin: 0.5 mg/dL (ref 0.3–1.2)
Total Protein: 6.5 g/dL (ref 6.5–8.1)

## 2019-03-20 LAB — URINALYSIS, ROUTINE W REFLEX MICROSCOPIC
Bilirubin Urine: NEGATIVE
Glucose, UA: NEGATIVE mg/dL
Hgb urine dipstick: NEGATIVE
Ketones, ur: 20 mg/dL — AB
Leukocytes,Ua: NEGATIVE
Nitrite: NEGATIVE
Protein, ur: NEGATIVE mg/dL
Specific Gravity, Urine: 1.019 (ref 1.005–1.030)
pH: 5 (ref 5.0–8.0)

## 2019-03-20 LAB — MAGNESIUM: Magnesium: 2.1 mg/dL (ref 1.7–2.4)

## 2019-03-20 LAB — RAPID URINE DRUG SCREEN, HOSP PERFORMED
Amphetamines: NOT DETECTED
Barbiturates: NOT DETECTED
Benzodiazepines: POSITIVE — AB
Cocaine: NOT DETECTED
Opiates: NOT DETECTED
Tetrahydrocannabinol: POSITIVE — AB

## 2019-03-20 LAB — PROTIME-INR
INR: 1.1 (ref 0.8–1.2)
Prothrombin Time: 13.9 seconds (ref 11.4–15.2)

## 2019-03-20 LAB — IRON AND TIBC
Iron: 28 ug/dL (ref 28–170)
Saturation Ratios: 6 % — ABNORMAL LOW (ref 10.4–31.8)
TIBC: 496 ug/dL — ABNORMAL HIGH (ref 250–450)
UIBC: 468 ug/dL

## 2019-03-20 LAB — FERRITIN: Ferritin: 6 ng/mL — ABNORMAL LOW (ref 11–307)

## 2019-03-20 LAB — MRSA PCR SCREENING: MRSA by PCR: NEGATIVE

## 2019-03-20 LAB — HIV ANTIBODY (ROUTINE TESTING W REFLEX): HIV Screen 4th Generation wRfx: NONREACTIVE

## 2019-03-20 MED ORDER — ACETAMINOPHEN 325 MG PO TABS: 650.0000 mg | ORAL_TABLET | Freq: Four times a day (QID) | ORAL | Status: DC | PRN

## 2019-03-20 MED ORDER — LEVETIRACETAM 250 MG PO TABS
250.0000 mg | ORAL_TABLET | Freq: Once | ORAL | Status: AC
  Administered 2019-03-20: 250 mg via ORAL
  Filled 2019-03-20: qty 1

## 2019-03-20 MED ORDER — SODIUM CHLORIDE 0.9 % IV SOLN
INTRAVENOUS | Status: DC | PRN
  Administered 2019-03-20: 250 mL via INTRAVENOUS

## 2019-03-20 MED ORDER — LEVETIRACETAM 750 MG PO TABS: 750.0000 mg | ORAL_TABLET | Freq: Two times a day (BID) | ORAL | 2 refills | Status: DC

## 2019-03-20 MED ORDER — FERROUS SULFATE 325 (65 FE) MG PO TABS: 325.0000 mg | ORAL_TABLET | Freq: Every day | ORAL | 1 refills | Status: DC

## 2019-03-20 NOTE — Discharge Summary (Signed)
Physician Discharge Summary  Jasmine [name] [id number] DOB: Apr 12, 1990 DOA: 03/19/2019  PCP: Clent Demark, PA-C  Admit date: 03/19/2019 Discharge date: 03/20/2019  Admitted From: home Disposition:  home  Recommendations for Outpatient Follow-up:  1. Follow up with PCP in 1-2 weeks  Home Health: none Equipment/Devices: none  Discharge Condition: stable CODE STATUS: Full code Diet recommendation: regular  HPI: Per admitting MD, Jasmine [name] is a 29 y.o. female with medical history significant for seizure disorder, moderate persistent asthma, allergic rhinitis, chronic anemia associated with baseline hemoglobin 9-12, who is admitted to [facility] on 03/19/2019 with seizures after presenting from home to [facility] Emergency Department for evaluation of seizure episode at home. The following history was provided by the emergency department physician as well as via chart review. The patient presented to [facility] long urgency department today for evaluation of a reported general tonic-clonic seizure that occurred at home earlier today, although the details regarding duration of the tonic-clonic activity as well as timeframe of any ensuing postictal state are not currently clear to me.  While going through the registration process in the emergency department, the patient demonstrated evidence of generalized tonic-clonic activity over the course of approximately 1 minute during which time she reportedly hit her head multiple times on the floor.  It is unclear to me if this episode was associated with a tongue biting or loss of bowel/bladder function. following conclusion of this generalized tonic-clonic activity, the patient reportedly entered a postictal state during which she was nonverbal, unable to follow instructions, with this state lasting approximately 10 to 15.  Following this 10-15-minute interval, the patient's mental status improved sufficiently that she was able to  discuss her case with the emergency department physician at which time she conveyed that she had previously been on [place] as her sole antiepileptic agent for several years before Keppra was discontinued in favor of Tegretol a few months, although the rationale for this transition is currently unclear to me.  The patient reported initial outstanding compliance with her home Tegretol, during which time she reports no seizure-like activity.  However, approximately 1 week ago, the patient reported that she ran out of her Tegretol and was completely off this medication over the ensuing 1 week leading up to today, at which time she reportedly experienced the previously described neurolysed tonic-clonic activity at home earlier today. Shortly after these conversations with the emergency department physician, the patient reportedly became agitated and combative, and due to concern for this activity rendering her as a potential harm to herself, the patient received Haldol 10 mg IM x1 as well as Ativan 2 mg IV x1.  Later during her emergency department course, the patient reportedly demonstrated evidence of another episode of generalized tonic-clonic activity, lasting approximately 1 minute again followed by a period of somnolence, diminished responsiveness to verbal stimuli, and inability to follow instructions.  The patient did receive Ativan 1 mg IV as his most recent generalized tonic-clonic activity appeared to be diminishing.  Hospital Course / Discharge diagnoses: Generalized tonic-clonic seizures-patient was admitted to the hospital with seizure-like activity, in the setting of nonadherence to medication regimen.  She states that she ran out of her Keppra at home about a week ago and has not had a chance to have a refill.  Initial ED visit reported that she was on Tegretol however patient denies, she tells me that she used to be on Tegretol when she was pregnant several years ago but currently she is  on Keppra 750  mg twice daily.  She was observed overnight in the ICU, has recovered well, has returned to baseline, she is alert and oriented x4 and had no further seizures.  She is going to go home.  Neurology has been consulted over the phone, she was loaded with Keppra in the ED and continued on 750 twice daily. Hypokalemia-this was repleted and normalized prior to discharge Leukocytosis-no fever, no signs of infection, likely in the setting of seizure.  Improving. Iron deficiency anemia-hemoglobin overall stable, anemia panel shows a degree of iron deficiency, started on iron supplements Chronic migraine headaches-resume home medications, follow-up with PCP as an outpatient  Discharge Instructions   Allergies as of 03/20/2019      Reactions   Cheese Shortness Of Breath   Chocolate Shortness Of Breath   Ibuprofen Hives, Shortness Of Breath   Children's ibuprofen   Ivp Dye [iodinated Diagnostic Agents] Hives, Shortness Of Breath, Itching   Latex Anaphylaxis, Swelling, Other (See Comments)   Reaction:  Localized swelling    Orange Juice [orange Oil] Shortness Of Breath   Other Hives, Other (See Comments)   Pt states that she is allergic to all steroids except IV solu-medrol.     Peach [prunus Persica] Anaphylaxis   Peanuts [peanut Oil] Anaphylaxis   Pear Anaphylaxis   Prednisone Hives   Raspberry Anaphylaxis   Tylenol [acetaminophen] Hives, Shortness Of Breath, Other (See Comments)   Pt states that this is only with the liquid form   Amoxicillin Hives, Other (See Comments)   Has patient had a PCN reaction causing immediate rash, facial/tongue/throat swelling, SOB or lightheadedness with hypotension: No Has patient had a PCN reaction causing severe rash involving mucus membranes or skin necrosis: No Has patient had a PCN reaction that required hospitalization: No Has patient had a PCN reaction occurring within the last 10 years: No If all of the above answers are "NO", then may proceed with  Cephalosporin use.   Apricot Flavor Hives   Doxycycline Hives   Erythromycin Hives   Milk Of Magnesia [magnesium Hydroxide] Hives, Itching   Penicillins Hives, Other (See Comments)   Has patient had a PCN reaction causing immediate rash, facial/tongue/throat swelling, SOB or lightheadedness with hypotension: No Has patient had a PCN reaction causing severe rash involving mucus membranes or skin necrosis: No Has patient had a PCN reaction that required hospitalization: No Has patient had a PCN reaction occurring within the last 10 years: No If all of the above answers are "NO", then may proceed with Cephalosporin use.      Medication List    STOP taking these medications   enoxaparin 40 MG/0.4ML injection Commonly known as: LOVENOX     TAKE these medications   albuterol 108 (90 Base) MCG/ACT inhaler Commonly known as: VENTOLIN HFA Inhale 2 puffs into the lungs every 4 (four) hours as needed for wheezing or shortness of breath.   cetirizine 10 MG tablet Commonly known as: ZYRTEC Take 1 tablet (10 mg total) by mouth daily.   docusate sodium 50 MG capsule Commonly known as: COLACE Take 1 capsule (50 mg total) by mouth 2 (two) times daily as needed for mild constipation.   dornase alpha 1 MG/ML nebulizer solution Commonly known as: PULMOZYME Take 2.5 mLs (2.5 mg total) by nebulization 2 (two) times daily.   feeding supplement (ENSURE ENLIVE) Liqd Take 237 mLs by mouth 2 (two) times daily between meals. 474 mL 3 times a day, provide 3-week supply   ferrous  sulfate 325 (65 FE) MG tablet Take 1 tablet (325 mg total) by mouth daily.   Fluticasone-Salmeterol 500-50 MCG/DOSE Aepb Commonly known as: ADVAIR Inhale 1 puff into the lungs 2 (two) times daily.   levETIRAcetam 750 MG tablet Commonly known as: Keppra Take 1 tablet (750 mg total) by mouth 2 (two) times daily.   ondansetron 8 MG disintegrating tablet Commonly known as: Zofran ODT Take 1 tablet (8 mg total) by mouth  every 8 (eight) hours as needed for nausea or vomiting.   senna-docusate 8.6-50 MG tablet Commonly known as: Senokot-S Take 2 tablets by mouth at bedtime. For AFTER surgery, do not take if having diarrhea   SUMAtriptan 50 MG tablet Commonly known as: IMITREX Take 1 tablet (50 mg total) by mouth daily. May take one more tablet two hours after the first. No more than two tablets per day.   topiramate 50 MG tablet Commonly known as: TOPAMAX Take 1 tablet (50 mg total) by mouth at bedtime.   Vitamin C 100 MG Chew Chew 1 tablet (100 mg total) by mouth daily.      Follow-up Information    Clent Demark, PA-C. Schedule an appointment as soon as possible for a visit in 2 week(s).   Specialty: Physician Assistant Contact information: [place] [address] [phone]           Consultations:  Neurology - phone  Procedures/Studies:  CT Head Wo Contrast  Result Date: 03/19/2019 CLINICAL DATA:  Seizure, abnormal neuro exam. Neck trauma, uncomplicated. Additional history provided: Patient with reported tonic clonic seizure in waiting room fell and struck head EXAM: CT HEAD WITHOUT CONTRAST CT CERVICAL SPINE WITHOUT CONTRAST TECHNIQUE: Multidetector CT imaging of the head and cervical spine was performed following the standard protocol without intravenous contrast. Multiplanar CT image reconstructions of the cervical spine were also generated. COMPARISON:  Head CT 06/13/2017, radiographs of the cervical spine 04/19/2016 FINDINGS: CT HEAD FINDINGS Brain: There is no evidence of acute intracranial hemorrhage, intracranial mass, midline shift or extra-axial fluid collection.No demarcated cortical infarction. Cerebral volume is normal for age. Vascular: No hyperdense vessel. Skull: Normal. Negative for fracture or focal lesion. Sinuses/Orbits: Visualized orbits demonstrate no acute abnormality. Redemonstrated left frontal sinus mucous retention cyst. No significant  mastoid effusion. Other: Right forehead/periorbital periorbital hematoma. CT CERVICAL SPINE FINDINGS Alignment: Straightening of the expected cervical lordosis. No significant spondylolisthesis. Skull base and vertebrae: The basion-dental and atlanto-dental intervals are maintained.No evidence of acute fracture to the cervical spine. Soft tissues and spinal canal: No prevertebral fluid or swelling. No visible canal hematoma. Disc levels: No significant bony spinal canal or neural foraminal narrowing at any level. Upper chest: No consolidation within the imaged lung apices. Biapical paraseptal emphysema with biapical blebs. IMPRESSION: CT head: 1. Unremarkable noncontrast CT appearance of the brain. No evidence of acute intracranial abnormality. 2. Right forehead/periorbital hematoma. 3. Redemonstrated left frontal sinus mucous retention cyst. CT cervical spine: 1. No evidence of acute fracture to the cervical spine. 2. The imaged lungs demonstrate biapical paraseptal emphysema with biapical blebs. Electronically Signed   By: Kellie Simmering DO   On: 03/19/2019 18:09   CT Cervical Spine Wo Contrast  Result Date: 03/19/2019 CLINICAL DATA:  Seizure, abnormal neuro exam. Neck trauma, uncomplicated. Additional history provided: Patient with reported tonic clonic seizure in waiting room fell and struck head EXAM: CT HEAD WITHOUT CONTRAST CT CERVICAL SPINE WITHOUT CONTRAST TECHNIQUE: Multidetector CT imaging of the head and cervical spine was performed following  the standard protocol without intravenous contrast. Multiplanar CT image reconstructions of the cervical spine were also generated. COMPARISON:  Head CT 06/13/2017, radiographs of the cervical spine 04/19/2016 FINDINGS: CT HEAD FINDINGS Brain: There is no evidence of acute intracranial hemorrhage, intracranial mass, midline shift or extra-axial fluid collection.No demarcated cortical infarction. Cerebral volume is normal for age. Vascular: No hyperdense vessel.  Skull: Normal. Negative for fracture or focal lesion. Sinuses/Orbits: Visualized orbits demonstrate no acute abnormality. Redemonstrated left frontal sinus mucous retention cyst. No significant mastoid effusion. Other: Right forehead/periorbital periorbital hematoma. CT CERVICAL SPINE FINDINGS Alignment: Straightening of the expected cervical lordosis. No significant spondylolisthesis. Skull base and vertebrae: The basion-dental and atlanto-dental intervals are maintained.No evidence of acute fracture to the cervical spine. Soft tissues and spinal canal: No prevertebral fluid or swelling. No visible canal hematoma. Disc levels: No significant bony spinal canal or neural foraminal narrowing at any level. Upper chest: No consolidation within the imaged lung apices. Biapical paraseptal emphysema with biapical blebs. IMPRESSION: CT head: 1. Unremarkable noncontrast CT appearance of the brain. No evidence of acute intracranial abnormality. 2. Right forehead/periorbital hematoma. 3. Redemonstrated left frontal sinus mucous retention cyst. CT cervical spine: 1. No evidence of acute fracture to the cervical spine. 2. The imaged lungs demonstrate biapical paraseptal emphysema with biapical blebs. Electronically Signed   By: Kellie Simmering DO   On: 03/19/2019 18:09   DG Chest Port 1 View  Result Date: 03/19/2019 CLINICAL DATA:  Weakness.  Seizure EXAM: PORTABLE CHEST 1 VIEW COMPARISON:  10/03/2018 FINDINGS: The heart size and mediastinal contours are within normal limits. Both lungs are clear. The visualized skeletal structures are unremarkable. IMPRESSION: No active disease. Electronically Signed   By: Kerby Moors M.D.   On: 03/19/2019 19:25     Subjective: - no chest pain, shortness of breath, no abdominal pain, nausea or vomiting. Wants to go home   Discharge Exam: BP 105/61   Pulse 96   Temp 99.3 F (37.4 C) (Oral)   Resp 19   Wt 68 kg   SpO2 100%   BMI 24.95 kg/m   General: Pt is alert, awake, not  in acute distress Cardiovascular: RRR, S1/S2 +, no rubs, no gallops Respiratory: CTA bilaterally, no wheezing, no rhonchi Abdominal: Soft, NT, ND, bowel sounds + Extremities: no edema, no cyanosis    The results of significant diagnostics from this hospitalization (including imaging, microbiology, ancillary and laboratory) are listed below for reference.     Microbiology: Recent Results (from the past 240 hour(s))  Respiratory Panel by RT PCR (Flu A&B, Covid) - Nasopharyngeal Swab     Status: None   Collection Time: 03/19/19  8:42 PM   Specimen: Nasopharyngeal Swab  Result Value Ref Range Status   SARS Coronavirus 2 by RT PCR NEGATIVE NEGATIVE Final    Comment: (NOTE) SARS-CoV-2 target nucleic acids are NOT DETECTED. The SARS-CoV-2 RNA is generally detectable in upper respiratoy specimens during the acute phase of infection. The lowest concentration of SARS-CoV-2 viral copies this assay can detect is 131 copies/mL. A negative result does not preclude SARS-Cov-2 infection and should not be used as the sole basis for treatment or other patient management decisions. A negative result may occur with  improper specimen collection/handling, submission of specimen other than nasopharyngeal swab, presence of viral mutation(s) within the areas targeted by this assay, and inadequate number of viral copies (<131 copies/mL). A negative result must be combined with clinical observations, patient history, and epidemiological information. The expected result is  Negative. Fact Sheet for Patients:  PinkCheek.be Fact Sheet for Healthcare Providers:  GravelBags.it This test is not yet ap proved or cleared by the [place] FDA and  has been authorized for detection and/or diagnosis of SARS-CoV-2 by FDA under an Emergency Use Authorization (EUA). This EUA will remain  in effect (meaning this test can be used) for the duration of  the COVID-19 declaration under Section 564(b)(1) of the Act, 21 U.S.C. section 360bbb-3(b)(1), unless the authorization is terminated or revoked sooner.    Influenza A by PCR NEGATIVE NEGATIVE Final   Influenza B by PCR NEGATIVE NEGATIVE Final    Comment: (NOTE) The Xpert Xpress SARS-CoV-2/FLU/RSV assay is intended as an aid in  the diagnosis of influenza from Nasopharyngeal swab specimens and  should not be used as a sole basis for treatment. Nasal washings and  aspirates are unacceptable for Xpert Xpress SARS-CoV-2/FLU/RSV  testing. Fact Sheet for Patients: PinkCheek.be Fact Sheet for Healthcare Providers: GravelBags.it This test is not yet approved or cleared by the [place] FDA and  has been authorized for detection and/or diagnosis of SARS-CoV-2 by  FDA under an Emergency Use Authorization (EUA). This EUA will remain  in effect (meaning this test can be used) for the duration of the  Covid-19 declaration under Section 564(b)(1) of the Act, 21  U.S.C. section 360bbb-3(b)(1), unless the authorization is  terminated or revoked. Performed at [facility], [place] [address], [place], [place] [address]   MRSA PCR Screening     Status: None   Collection Time: 03/19/19 10:33 PM   Specimen: Nasal Mucosa; Nasopharyngeal  Result Value Ref Range Status   MRSA by PCR NEGATIVE NEGATIVE Final    Comment:        The GeneXpert MRSA Assay (FDA approved for NASAL specimens only), is one component of a comprehensive MRSA colonization surveillance program. It is not intended to diagnose MRSA infection nor to guide or monitor treatment for MRSA infections. Performed at [facility], [place] [address] [place], [place] [address]      Labs: Basic Metabolic Panel: Recent Labs  Lab 03/19/19 1723 03/20/19 0329  NA 137 138  K 2.7* 3.9  CL 102 112*  CO2 15* 19*  GLUCOSE 150* 98  BUN 5* <5*   CREATININE 0.98 0.52  CALCIUM 9.3 8.5*  MG 2.4 2.1   Liver Function Tests: Recent Labs  Lab 03/19/19 1723 03/20/19 0329  AST 34 33  ALT 15 16  ALKPHOS 69 49  BILITOT 0.4 0.5  PROT 8.9* 6.5  ALBUMIN 5.0 3.9   CBC: Recent Labs  Lab 03/19/19 1723 03/20/19 0329  WBC 23.2* 19.4*  NEUTROABS 17.2* 15.9*  HGB 11.5* 9.2*  HCT 40.3 32.2*  MCV 79.8* 79.7*  PLT 402* 294   CBG: No results for input(s): GLUCAP in the last 168 hours. Hgb A1c No results for input(s): HGBA1C in the last 72 hours. Lipid Profile No results for input(s): CHOL, HDL, LDLCALC, TRIG, CHOLHDL, LDLDIRECT in the last 72 hours. Thyroid function studies No results for input(s): TSH, T4TOTAL, T3FREE, THYROIDAB in the last 72 hours.  Invalid input(s): FREET3 Urinalysis    Component Value Date/Time   COLORURINE YELLOW 03/20/2019 0625   APPEARANCEUR HAZY (A) 03/20/2019 0625   LABSPEC 1.019 03/20/2019 0625   PHURINE 5.0 03/20/2019 0625   GLUCOSEU NEGATIVE 03/20/2019 0625   HGBUR NEGATIVE 03/20/2019 0625   BILIRUBINUR NEGATIVE 03/20/2019 0625   KETONESUR 20 (A) 03/20/2019 0625   PROTEINUR NEGATIVE  03/20/2019 0625   UROBILINOGEN 1.0 11/04/2014 0946   NITRITE NEGATIVE 03/20/2019 0625   LEUKOCYTESUR NEGATIVE 03/20/2019 [id number]    FURTHER DISCHARGE INSTRUCTIONS:   Get Medicines reviewed and adjusted: Please take all your medications with you for your next visit with your Primary MD   Laboratory/radiological data: Please request your Primary MD to go over all hospital tests and procedure/radiological results at the follow up, please ask your Primary MD to get all Hospital records sent to his/her office.   In some cases, they will be blood work, cultures and biopsy results pending at the time of your discharge. Please request that your primary care M.D. goes through all the records of your hospital data and follows up on these results.   Also Note the following: If you experience worsening of your admission  symptoms, develop shortness of breath, life threatening emergency, suicidal or homicidal thoughts you must seek medical attention immediately by calling 911 or calling your MD immediately  if symptoms less severe.   You must read complete instructions/literature along with all the possible adverse reactions/side effects for all the Medicines you take and that have been prescribed to you. Take any new Medicines after you have completely understood and accpet all the possible adverse reactions/side effects.    Do not drive when taking Pain medications or sleeping medications (Benzodaizepines)   Do not take more than prescribed Pain, Sleep and Anxiety Medications. It is not advisable to combine anxiety,sleep and pain medications without talking with your primary care practitioner   Special Instructions: If you have smoked or chewed Tobacco  in the last 2 yrs please stop smoking, stop any regular Alcohol  and or any Recreational drug use.   Wear Seat belts while driving.   Please note: You were cared for by a hospitalist during your hospital stay. Once you are discharged, your primary care physician will handle any further medical issues. Please note that NO REFILLS for any discharge medications will be authorized once you are discharged, as it is imperative that you return to your primary care physician (or establish a relationship with a primary care physician if you do not have one) for your post hospital discharge needs so that they can reassess your need for medications and monitor your lab values.  Time coordinating discharge: 25 minutes  SIGNED:  Marzetta Board, MD, PhD 03/20/2019, 9:58 AM

## 2019-03-20 NOTE — Progress Notes (Signed)
   03/20/19 0631  Restraint Order  Restraint Status STOP  Education  Discontinuation Criteria Follows instructions  Non-violent Restraints  Soft Restraint Right Wrist Discontinued  Soft Restraint Left Wrist Discontinued   Attempted to wake patient up and patient woke up able to answer all orientation questions appropriately and kept asking what happened? Pt able to tell me that she came into [facility] ED for seizure at home and that she ran out of her medications. Discontinued bilateral wrist restraints and patient ambulated to [facility] to void. Pt remained somewhat drowsy and unsteady with ambulation. Pt asked for her cell phone to contact her sister. Pt left with call bell within reach, instructed not to get up alone and bed alarm on.

## 2019-03-20 NOTE — Discharge Instructions (Signed)
Follow with Clent Demark, PA-C in 2 weeks  Please get a complete blood count and chemistry panel checked by your Primary MD at your next visit, and again as instructed by your Primary MD. Please get your medications reviewed and adjusted by your Primary MD.  Please request your Primary MD to go over all Hospital Tests and Procedure/Radiological results at the follow up, please get all Hospital records sent to your Prim MD by signing hospital release before you go home.  In some cases, there will be blood work, cultures and biopsy results pending at the time of your discharge. Please request that your primary care M.D. goes through all the records of your hospital data and follows up on these results.  If you had Pneumonia of Lung problems at the Hospital: Please get a 2 view Chest X ray done in 6-8 weeks after hospital discharge or sooner if instructed by your Primary MD.  If you have Congestive Heart Failure: Please call your Cardiologist or Primary MD anytime you have any of the following symptoms:  1) 3 pound weight gain in 24 hours or 5 pounds in 1 week  2) shortness of breath, with or without a dry hacking cough  3) swelling in the hands, feet or stomach  4) if you have to sleep on extra pillows at night in order to breathe  Follow cardiac low salt diet and 1.5 lit/day fluid restriction.  If you have diabetes Accuchecks 4 times/day, Once in AM empty stomach and then before each meal. Log in all results and show them to your primary doctor at your next visit. If any glucose reading is under 80 or above 300 call your primary MD immediately.  If you have Seizure/Convulsions/Epilepsy: Please do not drive, operate heavy machinery, participate in activities at heights or participate in high speed sports until you have seen by Primary MD or a Neurologist and advised to do so again. Per [facility] statutes, patients with seizures are not allowed to drive until they have been  seizure-free for six months.  Use caution when using heavy equipment or power tools. Avoid working on ladders or at heights. Take showers instead of baths. Ensure the water temperature is not too high on the home water heater. Do not go swimming alone. Do not lock yourself in a room alone (i.e. bathroom). When caring for infants or small children, sit down when holding, feeding, or changing them to minimize risk of injury to the child in the event you have a seizure. Maintain good sleep hygiene. Avoid alcohol.   If you had Gastrointestinal Bleeding: Please ask your Primary MD to check a complete blood count within one week of discharge or at your next visit. Your endoscopic/colonoscopic biopsies that are pending at the time of discharge, will also need to followed by your Primary MD.  Get Medicines reviewed and adjusted. Please take all your medications with you for your next visit with your Primary MD  Please request your Primary MD to go over all hospital tests and procedure/radiological results at the follow up, please ask your Primary MD to get all Hospital records sent to his/her office.  If you experience worsening of your admission symptoms, develop shortness of breath, life threatening emergency, suicidal or homicidal thoughts you must seek medical attention immediately by calling 911 or calling your MD immediately  if symptoms less severe.  You must read complete instructions/literature along with all the possible adverse reactions/side effects for all the Medicines you  take and that have been prescribed to you. Take any new Medicines after you have completely understood and accpet all the possible adverse reactions/side effects.   Do not drive or operate heavy machinery when taking Pain medications.   Do not take more than prescribed Pain, Sleep and Anxiety Medications  Special Instructions: If you have smoked or chewed Tobacco  in the last 2 yrs please stop smoking, stop any regular  Alcohol  and or any Recreational drug use.  Wear Seat belts while driving.  Please note You were cared for by a hospitalist during your hospital stay. If you have any questions about your discharge medications or the care you received while you were in the hospital after you are discharged, you can call the unit and asked to speak with the hospitalist on call if the hospitalist that took care of you is not available. Once you are discharged, your primary care physician will handle any further medical issues. Please note that NO REFILLS for any discharge medications will be authorized once you are discharged, as it is imperative that you return to your primary care physician (or establish a relationship with a primary care physician if you do not have one) for your aftercare needs so that they can reassess your need for medications and monitor your lab values.  You can reach the hospitalist office at phone [phone] or fax [phone]   If you do not have a primary care physician, you can call [phone] for a physician referral.  Activity: As tolerated with Full fall precautions use walker/cane & assistance as needed    Diet: regular  Disposition Home

## 2019-03-21 LAB — PROLACTIN: Prolactin: 31.5 ng/mL — ABNORMAL HIGH (ref 4.8–23.3)

## 2019-03-23 ENCOUNTER — Telehealth: Payer: Self-pay

## 2019-03-23 NOTE — Telephone Encounter (Signed)
Transition Care Management Follow-up Telephone Call Date of discharge and from where: 03/20/2019, [facility].  Call placed to # [phone], call was rejected.  Call placed to # [phone], message stated that call cannot be completed at this time.

## 2019-03-24 ENCOUNTER — Telehealth: Payer: Self-pay

## 2019-03-24 NOTE — Telephone Encounter (Signed)
Transition Care Management Follow-up Telephone Call Date of discharge and from where: 03/20/2019, [facility]  Call placed to # [phone], call was rejected.  Call placed to # [phone], message stated that call cannot be completed at this time.   Letter sent to patient informing her that we have not been able to reach her and to please call the clinic to schedule a follow up appointment.

## 2019-03-25 ENCOUNTER — Other Ambulatory Visit: Payer: Self-pay

## 2019-03-25 ENCOUNTER — Emergency Department (HOSPITAL_COMMUNITY)
Admission: EM | Admit: 2019-03-25 | Discharge: 2019-03-25 | Discharge status: Against Medical Advice | Payer: Medicaid Other | Attending: Emergency Medicine | Admitting: Emergency Medicine

## 2019-03-25 ENCOUNTER — Encounter (HOSPITAL_COMMUNITY): Payer: Self-pay | Admitting: Emergency Medicine

## 2019-03-25 DIAGNOSIS — R569 Unspecified convulsions: Secondary | ICD-10-CM | POA: Insufficient documentation

## 2019-03-25 DIAGNOSIS — Z87891 Personal history of nicotine dependence: Secondary | ICD-10-CM | POA: Insufficient documentation

## 2019-03-25 DIAGNOSIS — G40909 Epilepsy, unspecified, not intractable, without status epilepticus: Secondary | ICD-10-CM | POA: Diagnosis not present

## 2019-03-25 DIAGNOSIS — Z91040 Latex allergy status: Secondary | ICD-10-CM | POA: Insufficient documentation

## 2019-03-25 DIAGNOSIS — D573 Sickle-cell trait: Secondary | ICD-10-CM | POA: Insufficient documentation

## 2019-03-25 DIAGNOSIS — R000 Tachycardia, unspecified: Secondary | ICD-10-CM | POA: Diagnosis not present

## 2019-03-25 DIAGNOSIS — J45909 Unspecified asthma, uncomplicated: Secondary | ICD-10-CM | POA: Diagnosis not present

## 2019-03-25 LAB — URINALYSIS, COMPLETE (UACMP) WITH MICROSCOPIC
Bilirubin Urine: NEGATIVE
Glucose, UA: NEGATIVE mg/dL
Hgb urine dipstick: NEGATIVE
Ketones, ur: 5 mg/dL — AB
Leukocytes,Ua: NEGATIVE
Nitrite: NEGATIVE
Protein, ur: NEGATIVE mg/dL
Specific Gravity, Urine: 1.016 (ref 1.005–1.030)
pH: 5 (ref 5.0–8.0)

## 2019-03-25 LAB — COMPREHENSIVE METABOLIC PANEL
ALT: 12 U/L (ref 0–44)
AST: 29 U/L (ref 15–41)
Albumin: 4.2 g/dL (ref 3.5–5.0)
Alkaline Phosphatase: 49 U/L (ref 38–126)
Anion gap: 19 — ABNORMAL HIGH (ref 5–15)
BUN: 7 mg/dL (ref 6–20)
CO2: 14 mmol/L — ABNORMAL LOW (ref 22–32)
Calcium: 8.9 mg/dL (ref 8.9–10.3)
Chloride: 104 mmol/L (ref 98–111)
Creatinine, Ser: 0.93 mg/dL (ref 0.44–1.00)
GFR calc Af Amer: >60 mL/min (ref >60)
GFR calc non Af Amer: >60 mL/min (ref >60)
Glucose, Bld: 117 mg/dL — ABNORMAL HIGH (ref 70–99)
Potassium: 3.4 mmol/L — ABNORMAL LOW (ref 3.5–5.1)
Sodium: 137 mmol/L (ref 135–145)
Total Bilirubin: 0.3 mg/dL (ref 0.3–1.2)
Total Protein: 6.9 g/dL (ref 6.5–8.1)

## 2019-03-25 LAB — CBC WITH DIFFERENTIAL/PLATELET
Abs Immature Granulocytes: 0.08 10*3/uL — ABNORMAL HIGH (ref 0.00–0.07)
Basophils Absolute: 0.1 10*3/uL (ref 0.0–0.1)
Basophils Relative: 0 %
Eosinophils Absolute: 0.2 10*3/uL (ref 0.0–0.5)
Eosinophils Relative: 1 %
HCT: 35.7 % — ABNORMAL LOW (ref 36.0–46.0)
Hemoglobin: 10.5 g/dL — ABNORMAL LOW (ref 12.0–15.0)
Immature Granulocytes: 0 %
Lymphocytes Relative: 28 %
Lymphs Abs: 5.1 10*3/uL — ABNORMAL HIGH (ref 0.7–4.0)
MCH: 22.9 pg — ABNORMAL LOW (ref 26.0–34.0)
MCHC: 29.4 g/dL — ABNORMAL LOW (ref 30.0–36.0)
MCV: 77.8 fL — ABNORMAL LOW (ref 80.0–100.0)
Monocytes Absolute: 0.8 10*3/uL (ref 0.1–1.0)
Monocytes Relative: 4 %
Neutro Abs: 12 10*3/uL — ABNORMAL HIGH (ref 1.7–7.7)
Neutrophils Relative %: 67 %
Platelets: 372 10*3/uL (ref 150–400)
RBC: 4.59 MIL/uL (ref 3.87–5.11)
RDW: 19.2 % — ABNORMAL HIGH (ref 11.5–15.5)
WBC: 18.3 10*3/uL — ABNORMAL HIGH (ref 4.0–10.5)
nRBC: 0 % (ref 0.0–0.2)

## 2019-03-25 LAB — RAPID URINE DRUG SCREEN, HOSP PERFORMED
Amphetamines: NOT DETECTED
Barbiturates: NOT DETECTED
Benzodiazepines: POSITIVE — AB
Cocaine: NOT DETECTED
Opiates: NOT DETECTED
Tetrahydrocannabinol: POSITIVE — AB

## 2019-03-25 LAB — I-STAT BETA HCG BLOOD, ED (MC, WL, AP ONLY): I-stat hCG, quantitative: <5 m[IU]/mL (ref <5)

## 2019-03-25 LAB — CBG MONITORING, ED: Glucose-Capillary: 116 mg/dL — ABNORMAL HIGH (ref 70–99)

## 2019-03-25 LAB — SALICYLATE LEVEL: Salicylate Lvl: <7 mg/dL — ABNORMAL LOW (ref 7.0–30.0)

## 2019-03-25 LAB — ACETAMINOPHEN LEVEL: Acetaminophen (Tylenol), Serum: <10 ug/mL — ABNORMAL LOW (ref 10–30)

## 2019-03-25 LAB — ETHANOL: Alcohol, Ethyl (B): <10 mg/dL (ref <10)

## 2019-03-25 MED ORDER — ZIPRASIDONE MESYLATE 20 MG IM SOLR
10.0000 mg | Freq: Once | INTRAMUSCULAR | Status: DC
  Filled 2019-03-25: qty 20

## 2019-03-25 MED ORDER — LORAZEPAM 2 MG/ML IJ SOLN
1.0000 mg | Freq: Once | INTRAMUSCULAR | Status: AC
  Administered 2019-03-25: 1 mg via INTRAVENOUS
  Filled 2019-03-25: qty 1

## 2019-03-25 MED ORDER — LEVETIRACETAM IN NACL 1000 MG/100ML IV SOLN: 1000.0000 mg | Freq: Once | INTRAVENOUS | Status: DC

## 2019-03-25 MED ORDER — STERILE WATER FOR INJECTION IJ SOLN
INTRAMUSCULAR | Status: AC
  Filled 2019-03-25: qty 10

## 2019-03-25 NOTE — ED Provider Notes (Signed)
[facility] EMERGENCY DEPARTMENT Provider Note   CSN: [id number] Arrival date & time: 03/25/19  1247     History Chief Complaint  Patient presents with  . Seizures    Jasmine [name] is a 29 y.o. female.  29 y.o female with a PMH of Asthma, Epilepsy, Sickle cell trait presents to the ED via POV for seizures. Patient was dropped off by friend in the ED. She arrived to the [place] and began laying on the floor and was rushed back to the room.  Patient reports she had a headache, and then "seizures" since discharge from the hospital a couple of days ago. History is very limited.   The history is provided by medical records and the patient.       Past Medical History:  Diagnosis Date  . Asthma   . Complication of anesthesia    "I wake up during anesthesia" (10/14/2015)  . DVT (deep vein thrombosis) in pregnancy   . Epilepsy ([place])   . GERD (gastroesophageal reflux disease)   . Heart murmur    last work up age 57- no symtoms  . Pancreatitis   . Sickle cell trait ([place])   . Sleep apnea    "used to wear CPAP; don't have one here in [place] since I moved in 2014" (10/14/2015)    Patient Active Problem List   Diagnosis Date Noted  . Status epilepticus ([place]) 03/28/2019  . Moderate persistent asthma 03/19/2019  . 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 Pelvic mass in female 12/25/2018  . Abnormal uterine bleeding   . Pelvic mass 12/22/2018  . Dehydration 12/22/2018  . Asthma exacerbation 12/01/2017  . Acute hypercapnic respiratory failure ([place]) 12/01/2017  . Exocrine pancreatic insufficiency 12/01/2017  . Facial contusion 07/17/2017  . Closed dislocation of right jaw 07/17/2017  . Nausea with hives 07/09/2017  . History of DVT (deep vein thrombosis) in pregnancy ([place]) 05/29/2017  . Sickle cell trait ([place]) 05/29/2017  . Acute asthma exacerbation 05/29/2017  . Chronic pancreatitis ([place]) 05/29/2017  . Seizures  ([place]) 05/29/2017  . Tobacco abuse 05/29/2017  . Acute on chronic respiratory failure with hypoxemia ([place]) 05/29/2017  . SOB (shortness of breath) 04/08/2017  . Cough 04/07/2017  . 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 Acute on chronic respiratory failure with hypoxia ([place]) 03/10/2017  . Colitis 11/12/2016  . Seizure ([place]) 11/12/2016  . Calculus of bile duct with acute cholecystitis with obstruction 10/14/2015  . Adjustment disorder with mixed anxiety and depressed mood 04/26/2015  . Low blood potassium 04/19/2015  . Asthma with acute exacerbation 04/19/2015  . Asthma 01/03/2015  . Asthma, chronic, unspecified asthma severity, with acute exacerbation 01/03/2015  . Influenza-like illness 01/03/2015  . Status post cesarean section 11/08/2014  . Previous cesarean section complicating pregnancy, antepartum condition or complication   . GERD (gastroesophageal reflux disease)   . Gastroesophageal reflux disease without esophagitis   . Abnormal biochemical finding on antenatal screening of mother   . Abnormal quad screen   . Echogenic focus of heart of fetus affecting antepartum care of mother   . Drug use affecting pregnancy, antepartum 05/19/2014  . Seizure disorder during pregnancy, antepartum ([place]) 05/13/2014  . Supervision of high risk pregnancy, antepartum 05/13/2014  . Asthma complicating pregnancy, antepartum 05/13/2014  . History of food anaphylaxis 05/13/2014  . Hypokalemia 04/08/2014  . Seizure disorder, sept 2015 last seizure 01/19/2013  . Anemia 01/19/2013  . Leukocytosis 01/19/2013    Past  Surgical History:  Procedure Laterality Date  . APPENDECTOMY    . CESAREAN SECTION  2013  . CESAREAN SECTION N/A 11/08/2014   Procedure: CESAREAN SECTION;  Surgeon: Truett Mainland, DO;  Location: [facility];  Service: Obstetrics;  Laterality: N/A;  . CHOLECYSTECTOMY N/A 10/16/2015   Procedure: LAPAROSCOPIC CHOLECYSTECTOMY WITH POSSIBLE INTRAOPERATIVE CHOLANGIOGRAM;  Surgeon: Donnie Mesa,  MD;  Location: [facility];  Service: General;  Laterality: N/A;  . CLOSED REDUCTION MANDIBLE WITH MANDIBULOMA N/A 07/17/2017   Procedure: REDUCTION TMJ WITH INTRAMAXILLARY FIXATION;  Surgeon: Diona Browner, DDS;  Location: [facility];  Service: Oral Surgery;  Laterality: N/A;  . ESOPHAGOGASTRODUODENOSCOPY (EGD) WITH PROPOFOL N/A 11/15/2016   Procedure: ESOPHAGOGASTRODUODENOSCOPY (EGD) WITH PROPOFOL;  Surgeon: Clarene Essex, MD;  Location: [facility];  Service: Endoscopy;  Laterality: N/A;  . INGUINAL HERNIA REPAIR Bilateral ~ 1996  . NERVE, TENDON AND ARTERY REPAIR Right 09/23/2012   Procedure: I&D and Repair As Necessary/Right Hand and Palm;  Surgeon: Roseanne Kaufman, MD;  Location: [facility];  Service: Orthopedics;  Laterality: Right;  . OMENTECTOMY N/A 12/25/2018   Procedure: OMENTECTOMY;  Surgeon: Lafonda Mosses, MD;  Location: [facility];  Service: Gynecology;  Laterality: N/A;  . SALPINGOOPHORECTOMY N/A 12/25/2018   Procedure: OPEN UNILATERAL  SALPINGO OOPHORECTOMY WITH STAGING;  Surgeon: Lafonda Mosses, MD;  Location: [facility];  Service: Gynecology;  Laterality: N/A;  . TONSILLECTOMY       OB History    Gravida  3   Para  3   Term  3   Preterm      AB      Living  3     SAB      TAB      Ectopic      Multiple  0   Live Births  3           Family History  Problem Relation Age of Onset  . Cancer Mother        cervical cancer  . Asthma Mother   . Hypertension Father   . Sickle cell anemia Father   . Asthma Sister   . Diabetes Maternal Aunt   . Lymphoma Maternal Grandmother     Social History   Tobacco Use  . Smoking status: Former Smoker    Packs/day: 0.25    Years: 4.00    Pack years: 1.00    Types: Cigarettes    Quit date: 10/02/2015    Years since quitting: 3.5  . Smokeless tobacco: Never Used  Substance Use Topics  . Alcohol use: No    Alcohol/week: 0.0 standard drinks  . Drug use: No    Home Medications Prior to Admission medications   Medication Sig  Start Date End Date Taking? Authorizing Provider  albuterol (PROVENTIL HFA;VENTOLIN HFA) 108 (90 Base) MCG/ACT inhaler Inhale 2 puffs into the lungs every 4 (four) hours as needed for wheezing or shortness of breath. 08/20/17   Clent Demark, PA-C  Ascorbic Acid (VITAMIN C) 100 MG CHEW Chew 1 tablet (100 mg total) by mouth daily. Patient not taking: Reported on 03/28/2019 08/20/17   Clent Demark, PA-C  cetirizine (ZYRTEC) 10 MG tablet Take 1 tablet (10 mg total) by mouth daily. Patient not taking: Reported on 03/28/2019 12/11/17   Ok Edwards, PA-C  docusate sodium (COLACE) 50 MG capsule Take 1 capsule (50 mg total) by mouth 2 (two) times daily as needed for mild constipation. Patient not taking: Reported on 03/28/2019 08/20/17   Altamease Oiler,  Lesli Albee, PA-C  feeding supplement, ENSURE ENLIVE, (ENSURE ENLIVE) LIQD Take 237 mLs by mouth 2 (two) times daily between meals. 474 mL 3 times a day, provide 3-week supply Patient not taking: Reported on 03/28/2019 08/20/17   Clent Demark, PA-C  ferrous sulfate 325 (65 FE) MG tablet Take 1 tablet (325 mg total) by mouth daily. 03/29/19 04/28/19  Raiford Noble Latif, DO  levETIRAcetam (KEPPRA) 1000 MG tablet Take 1 tablet (1,000 mg total) by mouth 2 (two) times daily. 03/29/19   Sheikh, Omair Latif, DO  mometasone-formoterol (DULERA) 200-5 MCG/ACT AERO Inhale 2 puffs into the lungs 2 (two) times daily.    [provider]  ondansetron (ZOFRAN ODT) 8 MG disintegrating tablet Take 1 tablet (8 mg total) by mouth every 8 (eight) hours as needed for nausea or vomiting. Patient not taking: Reported on 03/28/2019 12/28/18   Everitt Amber, MD  senna-docusate (SENOKOT-S) 8.6-50 MG tablet Take 2 tablets by mouth at bedtime. For AFTER surgery, do not take if having diarrhea Patient not taking: Reported on 03/28/2019 12/26/18   Joylene John D, NP  SUMAtriptan (IMITREX) 50 MG tablet Take 1 tablet (50 mg total) by mouth daily. May take one more tablet two hours after the  first. No more than two tablets per day. Patient not taking: Reported on 03/28/2019 01/06/18   Gildardo Pounds, NP  topiramate (TOPAMAX) 50 MG tablet Take 1 tablet (50 mg total) by mouth at bedtime. Patient not taking: Reported on 03/28/2019 01/06/18 03/25/19  Gildardo Pounds, NP    Allergies    Cheese, Chocolate, Ibuprofen, Ivp dye [iodinated diagnostic agents], Latex, Orange juice [orange oil], Other, Peach [prunus persica], Peanuts [peanut oil], Pear, Prednisone, Raspberry, Tylenol [acetaminophen], Amoxicillin, Apricot flavor, Doxycycline, Erythromycin, Milk of magnesia [magnesium hydroxide], and Penicillins  Review of Systems   Review of Systems  Constitutional: Negative for fever.  HENT: Negative for sinus pressure and sore throat.   Respiratory: Negative for shortness of breath.   Cardiovascular: Negative for chest pain.  Gastrointestinal: Negative for abdominal pain, diarrhea, nausea and vomiting.  Genitourinary: Negative for flank pain.  Musculoskeletal: Negative for back pain.  Neurological: Positive for seizures and headaches. Negative for syncope.  All other systems reviewed and are negative.   Physical Exam Updated Vital Signs BP 109/71   Pulse 99   Temp 98.2 F (36.8 C) (Axillary)   Resp 16   SpO2 100%   Physical Exam Vitals and nursing note reviewed.  Constitutional:      Appearance: Normal appearance.  HENT:     Head: Normocephalic and atraumatic.     Mouth/Throat:     Mouth: Mucous membranes are moist.  Eyes:     Pupils: Pupils are equal, round, and reactive to light.  Cardiovascular:     Rate and Rhythm: Normal rate.  Pulmonary:     Effort: Pulmonary effort is normal.     Breath sounds: Normal breath sounds. No wheezing.  Abdominal:     General: Abdomen is flat.     Tenderness: There is no abdominal tenderness. There is no right CVA tenderness, left CVA tenderness or guarding.  Musculoskeletal:     Cervical back: Normal range of motion and neck  supple.  Skin:    General: Skin is warm and dry.  Neurological:     Mental Status: She is alert and oriented to person, place, and time.     Comments: Alert, oriented, thought content appropriate. Speech fluent without evidence of aphasia. Able to follow  2 step commands without difficulty.  Cranial Nerves:  II:  Peripheral visual fields grossly normal, pupils, round, reactive to light III,IV, VI: ptosis not present, extra-ocular motions intact bilaterally  V,VII: smile symmetric, facial light touch sensation equal VIII: hearing grossly normal bilaterally  IX,X: midline uvula rise  XI: bilateral shoulder shrug equal and strong XII: midline tongue extension  Motor:  5/5 in upper and lower extremities bilaterally including strong and equal grip strength and dorsiflexion/plantar flexion Sensory: light touch normal in all extremities.  Cerebellar: normal finger-to-nose with bilateral upper extremities, pronator drift negative Gait: normal gait and balance      ED Results / Procedures / Treatments   Labs (all labs ordered are listed, but only abnormal results are displayed) Labs Reviewed  COMPREHENSIVE METABOLIC PANEL - Abnormal; Notable for the following components:      Result Value   Potassium 3.4 (*)    CO2 14 (*)    Glucose, Bld 117 (*)    Anion gap 19 (*)    All other components within normal limits  RAPID URINE DRUG SCREEN, HOSP PERFORMED - Abnormal; Notable for the following components:   Benzodiazepines POSITIVE (*)    Tetrahydrocannabinol POSITIVE (*)    All other components within normal limits  CBC WITH DIFFERENTIAL/PLATELET - Abnormal; Notable for the following components:   WBC 18.3 (*)    Hemoglobin 10.5 (*)    HCT 35.7 (*)    MCV 77.8 (*)    MCH 22.9 (*)    MCHC 29.4 (*)    RDW 19.2 (*)    Neutro Abs 12.0 (*)    Lymphs Abs 5.1 (*)    Abs Immature Granulocytes 0.08 (*)    All other components within normal limits  SALICYLATE LEVEL - Abnormal; Notable for the  following components:   Salicylate Lvl [id number] (*)    All other components within normal limits  ACETAMINOPHEN LEVEL - Abnormal; Notable for the following components:   Acetaminophen (Tylenol), Serum <10 (*)    All other components within normal limits  URINALYSIS, COMPLETE (UACMP) WITH MICROSCOPIC - Abnormal; Notable for the following components:   APPearance HAZY (*)    Ketones, ur 5 (*)    Bacteria, UA RARE (*)    All other components within normal limits  CBG MONITORING, ED - Abnormal; Notable for the following components:   Glucose-Capillary 116 (*)    All other components within normal limits  ETHANOL  I-STAT BETA HCG BLOOD, ED ([facility], [facility], [facility] ONLY)    EKG EKG Interpretation  Date/Time:  Wednesday March 25 2019 13:16:10 EST Ventricular Rate:  132 PR Interval:    QRS Duration: 68 QT Interval:  349 QTC Calculation: 518 R Axis:   64 Text Interpretation: Sinus tachycardia Prolonged QT interval Non-specific ST-t changes Confirmed by Lajean Saver [phone]) on 03/25/2019 1:22:20 PM   Radiology No results found.  Procedures Procedures (including critical care time)  Medications Ordered in ED Medications  LORazepam (ATIVAN) injection 1 mg (1 mg Intravenous Given 03/25/19 1329)    ED Course  I have reviewed the triage vital signs and the nursing notes.  Pertinent labs & imaging results that were available during my care of the patient were reviewed by me and considered in my medical decision making (see chart for details).  Clinical Course as of Apr 15 1002  Wed Mar 25, 2019  1711 Tetrahydrocannabinol(!): POSITIVE [JS]    Clinical Course User Index [JS] Janeece Fitting, PA-C   MDM Rules/Calculators/A&P  Patient arrived in the ED dropped off by a friend, she immediately laid on the floor in the waiting room, began crying along with "trembling ", she was brought back by nursing staff to ED room.  While she arrived in the ED room, she became combative, started trying to leave  her room, was swinging her arms and nursing staff, became very teary-eyed and was requesting to leave.  Due to unknown history, patient was kept in room in helped by nursing staff to calm down along with was screened by my attending Dr. Ashok Cordia and myself.  Patient was then talked down, we were able able to provide her with some Haldol due to her QTC.  She was given time to cooperate with staff, was able to provide a clear history and states she had a headache and felt like she was about to have a seizure while arriving to the ED.  According to her medical chart which I extensively review, patient was easily discharged from the hospital, work-up was consulted with neurology who provided her with a loading dose of 1500 IV of Keppra.  Keppra 500 mg IV twice daily was given during her hospital course, she does report compliance with this medication.  Place curbside consult for neurology for further evaluation.    3:13 PM patient reassessed by me, reports that she feels much better, does still endorse a headache however reports that she has had these headaches for a while especially after she has a seizure episode.  She reports she has been taking Keppra for seizure medication reports compliance with this.  5:05 PM Spoke to Dr. Lorraine Lax of neurology who recommended loading dose 1 g along with increasing medication if patient did not have a provoking event.  Discussed this with patient who is adamant that she is to be discharged home as she feels better.  Patient does report she has not been sleeping this past couple weeks that she has had all her kids at home.  Reports her father had been gone therefore feels that the lack of sleep likely cause her seizure today.  She was instructed that she will likely need a loading dose in the ED however she is refusing to stay in the ED for further work-up.  Patient is leaving [place].   Portions of this note were generated with Lobbyist.  Dictation errors may occur despite best attempts at proofreading.  Final Clinical Impression(s) / ED Diagnoses Final diagnoses:  Seizure [facility])    Rx / [place] Orders ED Discharge Orders    None       Janeece Fitting, PA-C 04/15/19 1004    Lajean Saver, MD 04/17/19 [phone]

## 2019-03-25 NOTE — ED Notes (Signed)
Pt requesting her IV be removed. States she would like to leave. Pt alert and oriented x4 at present time.

## 2019-03-25 NOTE — ED Notes (Signed)
Pt sitting up in bed asking if she can go home. When asking the patient orientation questions she states "can you ask me later?"

## 2019-03-25 NOTE — ED Notes (Signed)
Pt leaving against medical advice at this time. Risks of leaving explained to patient. Pt able to verbalize understanding. PA aware and spoke to patient regarding leaving AMA.

## 2019-03-25 NOTE — ED Triage Notes (Signed)
Pt arrives with significant other and daughter, who reports she was having a seizure. When getting pt out of car, pt was alert and talking. Pt was repetitive. Pt had seizure while in triage. When pt brought back to room, she became combative and trying to leave.

## 2019-03-27 ENCOUNTER — Inpatient Hospital Stay (HOSPITAL_COMMUNITY)
Admission: EM | Admit: 2019-03-27 | Discharge: 2019-03-29 | DRG: 101 | Disposition: A | Discharge status: Home / Self Care | Payer: Medicaid Other | Attending: Internal Medicine | Admitting: Internal Medicine

## 2019-03-27 ENCOUNTER — Encounter (HOSPITAL_COMMUNITY): Payer: Self-pay

## 2019-03-27 DIAGNOSIS — D72829 Elevated white blood cell count, unspecified: Secondary | ICD-10-CM | POA: Diagnosis present

## 2019-03-27 DIAGNOSIS — Z832 Family history of diseases of the blood and blood-forming organs and certain disorders involving the immune mechanism: Secondary | ICD-10-CM

## 2019-03-27 DIAGNOSIS — Z7951 Long term (current) use of inhaled steroids: Secondary | ICD-10-CM

## 2019-03-27 DIAGNOSIS — Z8049 Family history of malignant neoplasm of other genital organs: Secondary | ICD-10-CM

## 2019-03-27 DIAGNOSIS — Z825 Family history of asthma and other chronic lower respiratory diseases: Secondary | ICD-10-CM

## 2019-03-27 DIAGNOSIS — Z886 Allergy status to analgesic agent status: Secondary | ICD-10-CM

## 2019-03-27 DIAGNOSIS — Z880 Allergy status to penicillin: Secondary | ICD-10-CM

## 2019-03-27 DIAGNOSIS — Z87891 Personal history of nicotine dependence: Secondary | ICD-10-CM

## 2019-03-27 DIAGNOSIS — E872 Acidosis: Secondary | ICD-10-CM | POA: Diagnosis present

## 2019-03-27 DIAGNOSIS — Z79899 Other long term (current) drug therapy: Secondary | ICD-10-CM

## 2019-03-27 DIAGNOSIS — Z91018 Allergy to other foods: Secondary | ICD-10-CM

## 2019-03-27 DIAGNOSIS — G43909 Migraine, unspecified, not intractable, without status migrainosus: Secondary | ICD-10-CM | POA: Diagnosis present

## 2019-03-27 DIAGNOSIS — F1210 Cannabis abuse, uncomplicated: Secondary | ICD-10-CM | POA: Diagnosis present

## 2019-03-27 DIAGNOSIS — Z781 Physical restraint status: Secondary | ICD-10-CM

## 2019-03-27 DIAGNOSIS — Z20822 Contact with and (suspected) exposure to covid-19: Secondary | ICD-10-CM | POA: Diagnosis present

## 2019-03-27 DIAGNOSIS — D509 Iron deficiency anemia, unspecified: Secondary | ICD-10-CM | POA: Diagnosis present

## 2019-03-27 DIAGNOSIS — Z888 Allergy status to other drugs, medicaments and biological substances status: Secondary | ICD-10-CM

## 2019-03-27 DIAGNOSIS — Z833 Family history of diabetes mellitus: Secondary | ICD-10-CM

## 2019-03-27 DIAGNOSIS — R569 Unspecified convulsions: Secondary | ICD-10-CM

## 2019-03-27 DIAGNOSIS — G40909 Epilepsy, unspecified, not intractable, without status epilepticus: Secondary | ICD-10-CM | POA: Diagnosis not present

## 2019-03-27 DIAGNOSIS — Z881 Allergy status to other antibiotic agents status: Secondary | ICD-10-CM

## 2019-03-27 DIAGNOSIS — R4182 Altered mental status, unspecified: Secondary | ICD-10-CM | POA: Diagnosis not present

## 2019-03-27 DIAGNOSIS — G40901 Epilepsy, unspecified, not intractable, with status epilepticus: Secondary | ICD-10-CM | POA: Diagnosis present

## 2019-03-27 DIAGNOSIS — Z91041 Radiographic dye allergy status: Secondary | ICD-10-CM

## 2019-03-27 DIAGNOSIS — E876 Hypokalemia: Secondary | ICD-10-CM | POA: Diagnosis present

## 2019-03-27 DIAGNOSIS — Z86718 Personal history of other venous thrombosis and embolism: Secondary | ICD-10-CM

## 2019-03-27 DIAGNOSIS — G40401 Other generalized epilepsy and epileptic syndromes, not intractable, with status epilepticus: Principal | ICD-10-CM | POA: Diagnosis present

## 2019-03-27 DIAGNOSIS — K219 Gastro-esophageal reflux disease without esophagitis: Secondary | ICD-10-CM | POA: Diagnosis present

## 2019-03-27 DIAGNOSIS — Z8249 Family history of ischemic heart disease and other diseases of the circulatory system: Secondary | ICD-10-CM

## 2019-03-27 DIAGNOSIS — D573 Sickle-cell trait: Secondary | ICD-10-CM | POA: Diagnosis present

## 2019-03-27 DIAGNOSIS — Z91040 Latex allergy status: Secondary | ICD-10-CM

## 2019-03-27 DIAGNOSIS — J45909 Unspecified asthma, uncomplicated: Secondary | ICD-10-CM | POA: Diagnosis present

## 2019-03-27 LAB — COMPREHENSIVE METABOLIC PANEL
ALT: 13 U/L (ref 0–44)
AST: 26 U/L (ref 15–41)
Albumin: 4.4 g/dL (ref 3.5–5.0)
Alkaline Phosphatase: 51 U/L (ref 38–126)
Anion gap: 10 (ref 5–15)
BUN: 7 mg/dL (ref 6–20)
CO2: 26 mmol/L (ref 22–32)
Calcium: 9 mg/dL (ref 8.9–10.3)
Chloride: 101 mmol/L (ref 98–111)
Creatinine, Ser: 0.63 mg/dL (ref 0.44–1.00)
GFR calc Af Amer: >60 mL/min (ref >60)
GFR calc non Af Amer: >60 mL/min (ref >60)
Glucose, Bld: 95 mg/dL (ref 70–99)
Potassium: 2.9 mmol/L — ABNORMAL LOW (ref 3.5–5.1)
Sodium: 137 mmol/L (ref 135–145)
Total Bilirubin: 0.6 mg/dL (ref 0.3–1.2)
Total Protein: 7.4 g/dL (ref 6.5–8.1)

## 2019-03-27 LAB — I-STAT BETA HCG BLOOD, ED (MC, WL, AP ONLY): I-stat hCG, quantitative: <5 m[IU]/mL (ref <5)

## 2019-03-27 LAB — CBG MONITORING, ED: Glucose-Capillary: 104 mg/dL — ABNORMAL HIGH (ref 70–99)

## 2019-03-27 MED ORDER — LORAZEPAM 2 MG/ML IJ SOLN
2.0000 mg | Freq: Once | INTRAMUSCULAR | Status: AC
  Administered 2019-03-27: 22:00:00 2 mg via INTRAMUSCULAR

## 2019-03-27 MED ORDER — SODIUM CHLORIDE 0.9 % IV BOLUS
500.0000 mL | Freq: Once | INTRAVENOUS | Status: AC
  Administered 2019-03-27: 23:00:00 500 mL via INTRAVENOUS

## 2019-03-27 MED ORDER — LORAZEPAM 2 MG/ML IJ SOLN
INTRAMUSCULAR | Status: AC
  Filled 2019-03-27: qty 1

## 2019-03-27 MED ORDER — POTASSIUM CHLORIDE 10 MEQ/100ML IV SOLN
10.0000 meq | INTRAVENOUS | Status: AC
  Administered 2019-03-27 – 2019-03-28 (×2): 10 meq via INTRAVENOUS
  Filled 2019-03-27 (×2): qty 100

## 2019-03-27 MED ORDER — LEVETIRACETAM IN NACL 1000 MG/100ML IV SOLN
1000.0000 mg | Freq: Once | INTRAVENOUS | Status: AC
  Administered 2019-03-27: 23:00:00 1000 mg via INTRAVENOUS
  Filled 2019-03-27: qty 100

## 2019-03-27 MED ORDER — LEVETIRACETAM IN NACL 500 MG/100ML IV SOLN
500.0000 mg | Freq: Once | INTRAVENOUS | Status: AC
  Administered 2019-03-27: 23:00:00 500 mg via INTRAVENOUS
  Filled 2019-03-27: qty 100

## 2019-03-27 MED ORDER — LORAZEPAM 2 MG/ML IJ SOLN
2.0000 mg | Freq: Once | INTRAMUSCULAR | Status: AC
  Administered 2019-03-27: 23:00:00 2 mg via INTRAVENOUS
  Filled 2019-03-27: qty 1

## 2019-03-27 NOTE — ED Triage Notes (Signed)
Arrived POV. Patient reports seizure this evening. Patient "thinks" she got here in an [place]. Patient reports she hit the right side of her face and forehead. Right eye is slightly swollen and bruised, both eyes are red, and patient bit left side of tongue.

## 2019-03-27 NOTE — ED Provider Notes (Signed)
  Face-to-face evaluation   History: Patient presents for evaluation of seizure.  It is unclear how she got here.  Recently here for the same problem, and hospitalized a couple weeks ago when she had run out of her complaint.  Physical exam: I evaluated the patient after second seizure.  At this time, 10:45 PM, she was waking up and verbalizing coherent words and started to remove her monitoring devices and crawl out of bed.  She had to be physically restrained by staff.  She was moving all extremities equally.  IV sedation and physical restraint ordered  Medical screening examination/treatment/procedure(s) were conducted as a shared visit with non-physician practitioner(s) and myself.  I personally evaluated the patient during the encounter    Daleen Bo, MD 03/28/19 1021

## 2019-03-27 NOTE — ED Provider Notes (Signed)
[facility] DEPT Provider Note   CSN: [id number] Arrival date & time: 03/27/19  2130     History Chief Complaint  Patient presents with  . Seizures    Jasmine [name] is a 29 y.o. female with a hx of seizure, sickle cell trait, asthma presents to the Emergency Department with initial complaint of seizure at home.  Pt with seizure activity in triage and postictal state.  Additional witnessed seizure by me with tonic posturing, eyes rolled back, short period of apnea lasting approx 30 seconds.  Initially unresponsive 1-2 minutes afterwards then becoming agitated and combative.  She has not returned to baseline.    Level 5 Caveat for AMS.  Records reviewed.  Patient was admitted on 03/19/2019 with recurrent seizures after being out of her medications for 1 week.  She was loaded with 1.5 g of Keppra and discharged the next day.  She was seen again in the emergency department on 03/25/2019 for seizure, given Keppra and discharged from the emergency department as she returned to baseline.  Unable to contact spouse who is listed as emergency contact.  No one is with the patient   The history is provided by medical records. No language interpreter was used.       Past Medical History:  Diagnosis Date  . Asthma   . Complication of anesthesia    "I wake up during anesthesia" (10/14/2015)  . DVT (deep vein thrombosis) in pregnancy   . Epilepsy ([place])   . GERD (gastroesophageal reflux disease)   . Heart murmur    last work up age 43- no symtoms  . Pancreatitis   . Sickle cell trait ([place])   . Sleep apnea    "used to wear CPAP; don't have one here in  since I moved in 2014" (10/14/2015)    Patient Active Problem List   Diagnosis Date Noted  . Status epilepticus ([place]) 03/28/2019  . Moderate persistent asthma 03/19/2019  . 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 Pelvic mass in female 12/25/2018  .  Abnormal uterine bleeding   . Pelvic mass 12/22/2018  . Dehydration 12/22/2018  . Asthma exacerbation 12/01/2017  . Acute hypercapnic respiratory failure ([place]) 12/01/2017  . Exocrine pancreatic insufficiency 12/01/2017  . Facial contusion 07/17/2017  . Closed dislocation of right jaw 07/17/2017  . Nausea with hives 07/09/2017  . History of DVT (deep vein thrombosis) in pregnancy ([place]) 05/29/2017  . Sickle cell trait ([place]) 05/29/2017  . Acute asthma exacerbation 05/29/2017  . Chronic pancreatitis ([place]) 05/29/2017  . Seizures ([place]) 05/29/2017  . Tobacco abuse 05/29/2017  . Acute on chronic respiratory failure with hypoxemia ([place]) 05/29/2017  . SOB (shortness of breath) 04/08/2017  . Cough 04/07/2017  . 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 Acute on chronic respiratory failure with hypoxia ([place]) 03/10/2017  . Colitis 11/12/2016  . Seizure ([place]) 11/12/2016  . Calculus of bile duct with acute cholecystitis with obstruction 10/14/2015  . Adjustment disorder with mixed anxiety and depressed mood 04/26/2015  . Low blood potassium 04/19/2015  . Asthma with acute exacerbation 04/19/2015  . Asthma 01/03/2015  . Asthma, chronic, unspecified asthma severity, with acute exacerbation 01/03/2015  . Influenza-like illness 01/03/2015  . Status post cesarean section 11/08/2014  . Previous cesarean section complicating pregnancy, antepartum condition or complication   . GERD (gastroesophageal reflux disease)   . Gastroesophageal reflux disease without esophagitis   . Abnormal biochemical finding on antenatal screening of mother   .  Abnormal quad screen   . Echogenic focus of heart of fetus affecting antepartum care of mother   . Drug use affecting pregnancy, antepartum 05/19/2014  . Seizure disorder during pregnancy, antepartum ([place]) 05/13/2014  . Supervision of high risk pregnancy, antepartum 05/13/2014  . Asthma complicating pregnancy, antepartum 05/13/2014  . History of food anaphylaxis  05/13/2014  . Hypokalemia 04/08/2014  . Seizure disorder, sept 2015 last seizure 01/19/2013  . Anemia 01/19/2013  . Leukocytosis 01/19/2013    Past Surgical History:  Procedure Laterality Date  . APPENDECTOMY    . CESAREAN SECTION  2013  . CESAREAN SECTION N/A 11/08/2014   Procedure: CESAREAN SECTION;  Surgeon: Truett Mainland, DO;  Location: [facility];  Service: Obstetrics;  Laterality: N/A;  . CHOLECYSTECTOMY N/A 10/16/2015   Procedure: LAPAROSCOPIC CHOLECYSTECTOMY WITH POSSIBLE INTRAOPERATIVE CHOLANGIOGRAM;  Surgeon: Donnie Mesa, MD;  Location: [facility];  Service: General;  Laterality: N/A;  . CLOSED REDUCTION MANDIBLE WITH MANDIBULOMA N/A 07/17/2017   Procedure: REDUCTION TMJ WITH INTRAMAXILLARY FIXATION;  Surgeon: Diona Browner, DDS;  Location: [facility];  Service: Oral Surgery;  Laterality: N/A;  . ESOPHAGOGASTRODUODENOSCOPY (EGD) WITH PROPOFOL N/A 11/15/2016   Procedure: ESOPHAGOGASTRODUODENOSCOPY (EGD) WITH PROPOFOL;  Surgeon: Clarene Essex, MD;  Location: [facility];  Service: Endoscopy;  Laterality: N/A;  . INGUINAL HERNIA REPAIR Bilateral ~ 1996  . NERVE, TENDON AND ARTERY REPAIR Right 09/23/2012   Procedure: I&D and Repair As Necessary/Right Hand and Palm;  Surgeon: Roseanne Kaufman, MD;  Location: [facility];  Service: Orthopedics;  Laterality: Right;  . OMENTECTOMY N/A 12/25/2018   Procedure: OMENTECTOMY;  Surgeon: Lafonda Mosses, MD;  Location: [facility];  Service: Gynecology;  Laterality: N/A;  . SALPINGOOPHORECTOMY N/A 12/25/2018   Procedure: OPEN UNILATERAL  SALPINGO OOPHORECTOMY WITH STAGING;  Surgeon: Lafonda Mosses, MD;  Location: [facility];  Service: Gynecology;  Laterality: N/A;  . TONSILLECTOMY       OB History    Gravida  3   Para  3   Term  3   Preterm      AB      Living  3     SAB      TAB      Ectopic      Multiple  0   Live Births  3           Family History  Problem Relation Age of Onset  . Cancer Mother        cervical cancer  . Asthma  Mother   . Hypertension Father   . Sickle cell anemia Father   . Asthma Sister   . Diabetes Maternal Aunt   . Lymphoma Maternal Grandmother     Social History   Tobacco Use  . Smoking status: Former Smoker    Packs/day: 0.25    Years: 4.00    Pack years: 1.00    Types: Cigarettes    Quit date: 10/02/2015    Years since quitting: 3.4  . Smokeless tobacco: Never Used  Substance Use Topics  . Alcohol use: No    Alcohol/week: 0.0 standard drinks  . Drug use: No    Home Medications Prior to Admission medications   Medication Sig Start Date End Date Taking? Authorizing Provider  albuterol (PROVENTIL HFA;VENTOLIN HFA) 108 (90 Base) MCG/ACT inhaler Inhale 2 puffs into the lungs every 4 (four) hours as needed for wheezing or shortness of breath. 08/20/17   Clent Demark, PA-C  Ascorbic Acid (VITAMIN C) 100 MG CHEW Chew  1 tablet (100 mg total) by mouth daily. 08/20/17   Clent Demark, PA-C  cetirizine (ZYRTEC) 10 MG tablet Take 1 tablet (10 mg total) by mouth daily. 12/11/17   Tasia Catchings, Amy V, PA-C  docusate sodium (COLACE) 50 MG capsule Take 1 capsule (50 mg total) by mouth 2 (two) times daily as needed for mild constipation. 08/20/17   Clent Demark, PA-C  dornase alpha (PULMOZYME) 1 MG/ML nebulizer solution Take 2.5 mLs (2.5 mg total) by nebulization 2 (two) times daily. 08/20/17   Clent Demark, PA-C  feeding supplement, ENSURE ENLIVE, (ENSURE ENLIVE) LIQD Take 237 mLs by mouth 2 (two) times daily between meals. 474 mL 3 times a day, provide 3-week supply 08/20/17   Clent Demark, PA-C  ferrous sulfate 325 (65 FE) MG tablet Take 1 tablet (325 mg total) by mouth daily. 03/20/19 03/19/20  Caren Griffins, MD  Fluticasone-Salmeterol (ADVAIR) 500-50 MCG/DOSE AEPB Inhale 1 puff into the lungs 2 (two) times daily. 08/20/17   Clent Demark, PA-C  levETIRAcetam (KEPPRA) 750 MG tablet Take 1 tablet (750 mg total) by mouth 2 (two) times daily. 03/20/19   Caren Griffins, MD  ondansetron  (ZOFRAN ODT) 8 MG disintegrating tablet Take 1 tablet (8 mg total) by mouth every 8 (eight) hours as needed for nausea or vomiting. 12/28/18   Everitt Amber, MD  senna-docusate (SENOKOT-S) 8.6-50 MG tablet Take 2 tablets by mouth at bedtime. For AFTER surgery, do not take if having diarrhea 12/26/18   Cross, Lenna Sciara D, NP  SUMAtriptan (IMITREX) 50 MG tablet Take 1 tablet (50 mg total) by mouth daily. May take one more tablet two hours after the first. No more than two tablets per day. 01/06/18   Gildardo Pounds, NP  topiramate (TOPAMAX) 50 MG tablet Take 1 tablet (50 mg total) by mouth at bedtime. 01/06/18 03/25/19  Gildardo Pounds, NP    Allergies    Cheese, Chocolate, Ibuprofen, Ivp dye [iodinated diagnostic agents], Latex, Orange juice [orange oil], Other, Peach [prunus persica], Peanuts [peanut oil], Pear, Prednisone, Raspberry, Tylenol [acetaminophen], Amoxicillin, Apricot flavor, Doxycycline, Erythromycin, Milk of magnesia [magnesium hydroxide], and Penicillins  Review of Systems   Review of Systems  Unable to perform ROS: Mental status change  Neurological: Positive for seizures.    Physical Exam Updated Vital Signs BP 131/81 (BP Location: Right Arm)   Pulse 87   Temp 98.2 F (36.8 C) (Oral)   Resp 16   SpO2 100%   Physical Exam Vitals and nursing note reviewed.  Constitutional:      General: She is not in acute distress.    Appearance: She is not diaphoretic.     Comments: Pt lethargic and confused on initial eval, agitated and confused after first seizure, lethargic after second seizure.  She does not follow commands at this time.  HENT:     Head: Normocephalic.  Eyes:     General: No scleral icterus.    Conjunctiva/sclera:     Right eye: Right conjunctiva is injected.     Left eye: Left conjunctiva is injected.  Cardiovascular:     Rate and Rhythm: Regular rhythm. Tachycardia present.     Pulses: Normal pulses.          Radial pulses are 2+ on the right side and 2+ on  the left side.  Pulmonary:     Effort: No tachypnea, accessory muscle usage, prolonged expiration, respiratory distress or retractions.     Breath sounds: No stridor.  Comments: Equal chest rise. No increased work of breathing. Abdominal:     General: There is no distension.     Palpations: Abdomen is soft.     Tenderness: There is no abdominal tenderness. There is no guarding or rebound.  Musculoskeletal:     Cervical back: Normal range of motion.     Comments: Moves all extremities equally and without difficulty.  Skin:    General: Skin is warm and dry.     Capillary Refill: Capillary refill takes less than 2 seconds.  Neurological:     Mental Status: She is lethargic.     GCS: GCS eye subscore is 4. GCS verbal subscore is 5. GCS motor subscore is 6.     Comments: Speech is clear and goal oriented.  Psychiatric:        Mood and Affect: Mood normal.     ED Results / Procedures / Treatments   Labs (all labs ordered are listed, but only abnormal results are displayed) Labs Reviewed  CBC WITH DIFFERENTIAL/PLATELET - Abnormal; Notable for the following components:      Result Value   WBC 13.1 (*)    Hemoglobin 9.7 (*)    HCT 31.7 (*)    MCV 75.7 (*)    MCH 23.2 (*)    RDW 18.7 (*)    Neutro Abs 7.9 (*)    Lymphs Abs 4.2 (*)    All other components within normal limits  COMPREHENSIVE METABOLIC PANEL - Abnormal; Notable for the following components:   Potassium 2.9 (*)    All other components within normal limits  CBG MONITORING, ED - Abnormal; Notable for the following components:   Glucose-Capillary 104 (*)    All other components within normal limits  SARS CORONAVIRUS 2 (TAT 6-24 HRS)  URINALYSIS, ROUTINE W REFLEX MICROSCOPIC  RAPID URINE DRUG SCREEN, HOSP PERFORMED  I-STAT BETA HCG BLOOD, ED ([facility], [facility], [facility] ONLY)     Radiology No results found.  Procedures .Critical Care Performed by: Abigail Butts, PA-C Authorized by: Abigail Butts, PA-C    Critical care provider statement:    Critical care time (minutes):  45   Critical care time was exclusive of:  Separately billable procedures and treating other patients and teaching time   Critical care was necessary to treat or prevent imminent or life-threatening deterioration of the following conditions:  CNS failure or compromise   Critical care was time spent personally by me on the following activities:  Discussions with consultants, evaluation of patient's response to treatment, examination of patient, ordering and performing treatments and interventions, ordering and review of laboratory studies, ordering and review of radiographic studies, pulse oximetry, re-evaluation of patient's condition, obtaining history from patient or surrogate and review of old charts   (including critical care time)  Medications Ordered in ED Medications  potassium chloride 10 mEq in 100 mL IVPB (10 mEq Intravenous New Bag/Given 03/28/19 0044)  LORazepam (ATIVAN) injection 2 mg (2 mg Intramuscular Given 03/27/19 2229)  levETIRAcetam (KEPPRA) IVPB 1000 mg/100 mL premix (0 mg Intravenous Stopped 03/27/19 2308)  sodium chloride 0.9 % bolus 500 mL (0 mLs Intravenous Stopped 03/28/19 0007)  levETIRAcetam (KEPPRA) IVPB 500 mg/100 mL premix (0 mg Intravenous Stopped 03/28/19 0007)  LORazepam (ATIVAN) injection 2 mg (2 mg Intravenous Given 03/27/19 2257)    ED Course  I have reviewed the triage vital signs and the nursing notes.  Pertinent labs & imaging results that were available during my care of the patient were reviewed by  me and considered in my medical decision making (see chart for details).  Clinical Course as of Mar 28 111  Fri Mar 27, 2019  2236 Pt with recurrent seizure.  Aprrox 97min of tonic clonic activity.     [HM]  2243 Discussed with Dr. Lorraine Lax.  Will give 1500mg  Ketamine. If she does not return to baseline, will admit to [facility] for Neuro eval.   [HM]  2259 Pt continues to be postictal, combative  and agitated; unable to follow commands or answer questions. Additional 2mg  of ativan given and restraints applied   [HM]  2314 Discussed with spouse who reports pt took her seizure medication today and then reported she wanted to go to the hospital.  Pt spouse reports he called her a [employer].     [HM]  2316 Baseline - hx of anemia  Hemoglobin(!): 9.7 [HM]  Sat Mar 28, 2019  0057 Patient remains altered.  Will admit   [HM]  0057 Potassium IV given as she is not coherent enough to take p.o.  Potassium(!): 2.9 [HM]    Clinical Course User Index [HM] Emrick Hensch, Gwenlyn Perking   MDM Rules/Calculators/A&P                       Patient presents with known seizure disorder and multiple seizures tonight.  Multiple seizures here in the emergency department requiring recurrent intervention.  She has not returned to baseline.  She has been given 4 mg of Ativan and 1500 mg of Keppra.  No additional seizures in the last 2 hours since Keppra load finished.  She will need admission and neurology evaluation.  Per neurology, patient to be admitted at [facility] and they will evaluate upon her arrival.  She will likely need EEG.  The patient was discussed with and seen by Dr. Eulis Foster who agrees with the treatment plan.  Discussed patient's case with hospitalist, Dr. Hal Hope.  I have recommended admission and patient (and family if present) agree with this plan. Admitting physician will place admission orders.    Final Clinical Impression(s) / ED Diagnoses Final diagnoses:  Seizure ([place])  Status epilepticus ([place])  Altered mental status, unspecified altered mental status type    Rx / DC Orders ED Discharge Orders    None       Divante Kotch, Gwenlyn Perking 03/28/19 0113    Daleen Bo, MD 03/28/19 1020

## 2019-03-28 ENCOUNTER — Inpatient Hospital Stay (HOSPITAL_COMMUNITY): Payer: Medicaid Other

## 2019-03-28 ENCOUNTER — Encounter (HOSPITAL_COMMUNITY): Payer: Self-pay | Admitting: Internal Medicine

## 2019-03-28 DIAGNOSIS — Z87891 Personal history of nicotine dependence: Secondary | ICD-10-CM | POA: Diagnosis not present

## 2019-03-28 DIAGNOSIS — G40401 Other generalized epilepsy and epileptic syndromes, not intractable, with status epilepticus: Secondary | ICD-10-CM | POA: Diagnosis present

## 2019-03-28 DIAGNOSIS — E872 Acidosis: Secondary | ICD-10-CM | POA: Diagnosis not present

## 2019-03-28 DIAGNOSIS — Z886 Allergy status to analgesic agent status: Secondary | ICD-10-CM | POA: Diagnosis not present

## 2019-03-28 DIAGNOSIS — F1210 Cannabis abuse, uncomplicated: Secondary | ICD-10-CM | POA: Diagnosis not present

## 2019-03-28 DIAGNOSIS — Z91040 Latex allergy status: Secondary | ICD-10-CM | POA: Diagnosis not present

## 2019-03-28 DIAGNOSIS — K219 Gastro-esophageal reflux disease without esophagitis: Secondary | ICD-10-CM | POA: Diagnosis not present

## 2019-03-28 DIAGNOSIS — D649 Anemia, unspecified: Secondary | ICD-10-CM | POA: Diagnosis not present

## 2019-03-28 DIAGNOSIS — R569 Unspecified convulsions: Secondary | ICD-10-CM | POA: Diagnosis not present

## 2019-03-28 DIAGNOSIS — E876 Hypokalemia: Secondary | ICD-10-CM | POA: Diagnosis not present

## 2019-03-28 DIAGNOSIS — G40909 Epilepsy, unspecified, not intractable, without status epilepticus: Secondary | ICD-10-CM | POA: Diagnosis not present

## 2019-03-28 DIAGNOSIS — D509 Iron deficiency anemia, unspecified: Secondary | ICD-10-CM

## 2019-03-28 DIAGNOSIS — J45909 Unspecified asthma, uncomplicated: Secondary | ICD-10-CM | POA: Diagnosis not present

## 2019-03-28 DIAGNOSIS — Z91041 Radiographic dye allergy status: Secondary | ICD-10-CM | POA: Diagnosis not present

## 2019-03-28 DIAGNOSIS — Z8049 Family history of malignant neoplasm of other genital organs: Secondary | ICD-10-CM | POA: Diagnosis not present

## 2019-03-28 DIAGNOSIS — D573 Sickle-cell trait: Secondary | ICD-10-CM | POA: Diagnosis not present

## 2019-03-28 DIAGNOSIS — Z880 Allergy status to penicillin: Secondary | ICD-10-CM | POA: Diagnosis not present

## 2019-03-28 DIAGNOSIS — Z91018 Allergy to other foods: Secondary | ICD-10-CM | POA: Diagnosis not present

## 2019-03-28 DIAGNOSIS — Z8249 Family history of ischemic heart disease and other diseases of the circulatory system: Secondary | ICD-10-CM | POA: Diagnosis not present

## 2019-03-28 DIAGNOSIS — D72829 Elevated white blood cell count, unspecified: Secondary | ICD-10-CM

## 2019-03-28 DIAGNOSIS — G40901 Epilepsy, unspecified, not intractable, with status epilepticus: Secondary | ICD-10-CM | POA: Diagnosis present

## 2019-03-28 DIAGNOSIS — R4182 Altered mental status, unspecified: Secondary | ICD-10-CM | POA: Diagnosis not present

## 2019-03-28 DIAGNOSIS — Z888 Allergy status to other drugs, medicaments and biological substances status: Secondary | ICD-10-CM | POA: Diagnosis not present

## 2019-03-28 DIAGNOSIS — Z79899 Other long term (current) drug therapy: Secondary | ICD-10-CM | POA: Diagnosis not present

## 2019-03-28 DIAGNOSIS — Z881 Allergy status to other antibiotic agents status: Secondary | ICD-10-CM | POA: Diagnosis not present

## 2019-03-28 DIAGNOSIS — Z20822 Contact with and (suspected) exposure to covid-19: Secondary | ICD-10-CM | POA: Diagnosis not present

## 2019-03-28 DIAGNOSIS — Z7951 Long term (current) use of inhaled steroids: Secondary | ICD-10-CM | POA: Diagnosis not present

## 2019-03-28 DIAGNOSIS — G43909 Migraine, unspecified, not intractable, without status migrainosus: Secondary | ICD-10-CM | POA: Diagnosis not present

## 2019-03-28 LAB — CBC WITH DIFFERENTIAL/PLATELET
Abs Immature Granulocytes: 0.04 10*3/uL (ref 0.00–0.07)
Abs Immature Granulocytes: 0.04 10*3/uL (ref 0.00–0.07)
Basophils Absolute: 0.1 10*3/uL (ref 0.0–0.1)
Basophils Absolute: 0 10*3/uL (ref 0.0–0.1)
Basophils Relative: 1 %
Basophils Relative: 0 %
Eosinophils Absolute: 0.2 10*3/uL (ref 0.0–0.5)
Eosinophils Absolute: 0.2 10*3/uL (ref 0.0–0.5)
Eosinophils Relative: 2 %
Eosinophils Relative: 2 %
HCT: 31.7 % — ABNORMAL LOW (ref 36.0–46.0)
HCT: 31.5 % — ABNORMAL LOW (ref 36.0–46.0)
Hemoglobin: 9.7 g/dL — ABNORMAL LOW (ref 12.0–15.0)
Hemoglobin: 9.6 g/dL — ABNORMAL LOW (ref 12.0–15.0)
Immature Granulocytes: 0 %
Immature Granulocytes: 0 %
Lymphocytes Relative: 32 %
Lymphocytes Relative: 22 %
Lymphs Abs: 4.2 10*3/uL — ABNORMAL HIGH (ref 0.7–4.0)
Lymphs Abs: 2.4 10*3/uL (ref 0.7–4.0)
MCH: 23.2 pg — ABNORMAL LOW (ref 26.0–34.0)
MCH: 22.8 pg — ABNORMAL LOW (ref 26.0–34.0)
MCHC: 30.6 g/dL (ref 30.0–36.0)
MCHC: 30.5 g/dL (ref 30.0–36.0)
MCV: 75.7 fL — ABNORMAL LOW (ref 80.0–100.0)
MCV: 74.8 fL — ABNORMAL LOW (ref 80.0–100.0)
Monocytes Absolute: 0.8 10*3/uL (ref 0.1–1.0)
Monocytes Absolute: 0.5 10*3/uL (ref 0.1–1.0)
Monocytes Relative: 6 %
Monocytes Relative: 5 %
Neutro Abs: 7.9 10*3/uL — ABNORMAL HIGH (ref 1.7–7.7)
Neutro Abs: 7.6 10*3/uL (ref 1.7–7.7)
Neutrophils Relative %: 59 %
Neutrophils Relative %: 71 %
Platelets: 312 10*3/uL (ref 150–400)
Platelets: 279 10*3/uL (ref 150–400)
RBC: 4.19 MIL/uL (ref 3.87–5.11)
RBC: 4.21 MIL/uL (ref 3.87–5.11)
RDW: 18.7 % — ABNORMAL HIGH (ref 11.5–15.5)
RDW: 19 % — ABNORMAL HIGH (ref 11.5–15.5)
WBC: 13.1 10*3/uL — ABNORMAL HIGH (ref 4.0–10.5)
WBC: 10.8 10*3/uL — ABNORMAL HIGH (ref 4.0–10.5)
nRBC: 0 % (ref 0.0–0.2)
nRBC: 0 % (ref 0.0–0.2)

## 2019-03-28 LAB — COMPREHENSIVE METABOLIC PANEL
ALT: 10 U/L (ref 0–44)
AST: 24 U/L (ref 15–41)
Albumin: 3.4 g/dL — ABNORMAL LOW (ref 3.5–5.0)
Alkaline Phosphatase: 44 U/L (ref 38–126)
Anion gap: 9 (ref 5–15)
BUN: 5 mg/dL — ABNORMAL LOW (ref 6–20)
CO2: 21 mmol/L — ABNORMAL LOW (ref 22–32)
Calcium: 8.3 mg/dL — ABNORMAL LOW (ref 8.9–10.3)
Chloride: 108 mmol/L (ref 98–111)
Creatinine, Ser: 0.53 mg/dL (ref 0.44–1.00)
GFR calc Af Amer: >60 mL/min (ref >60)
GFR calc non Af Amer: >60 mL/min (ref >60)
Glucose, Bld: 88 mg/dL (ref 70–99)
Potassium: 3.7 mmol/L (ref 3.5–5.1)
Sodium: 138 mmol/L (ref 135–145)
Total Bilirubin: 0.2 mg/dL — ABNORMAL LOW (ref 0.3–1.2)
Total Protein: 5.9 g/dL — ABNORMAL LOW (ref 6.5–8.1)

## 2019-03-28 LAB — GLUCOSE, CAPILLARY
Glucose-Capillary: 100 mg/dL — ABNORMAL HIGH (ref 70–99)
Glucose-Capillary: 90 mg/dL (ref 70–99)
Glucose-Capillary: 91 mg/dL (ref 70–99)

## 2019-03-28 LAB — CBG MONITORING, ED: Glucose-Capillary: 94 mg/dL (ref 70–99)

## 2019-03-28 LAB — MRSA PCR SCREENING: MRSA by PCR: NEGATIVE

## 2019-03-28 LAB — SARS CORONAVIRUS 2 (TAT 6-24 HRS): SARS Coronavirus 2: NEGATIVE

## 2019-03-28 MED ORDER — GADOBUTROL 1 MMOL/ML IV SOLN
6.8000 mL | Freq: Once | INTRAVENOUS | Status: AC | PRN
  Administered 2019-03-28: 19:00:00 6.8 mL via INTRAVENOUS

## 2019-03-28 MED ORDER — ACETAMINOPHEN 650 MG RE SUPP: 650.0000 mg | Freq: Four times a day (QID) | RECTAL | Status: DC | PRN

## 2019-03-28 MED ORDER — LEVETIRACETAM IN NACL 1000 MG/100ML IV SOLN
1000.0000 mg | Freq: Two times a day (BID) | INTRAVENOUS | Status: DC
  Administered 2019-03-28 – 2019-03-29 (×3): 1000 mg via INTRAVENOUS
  Filled 2019-03-28 (×4): qty 100

## 2019-03-28 MED ORDER — ONDANSETRON HCL 4 MG PO TABS: 4.0000 mg | ORAL_TABLET | Freq: Four times a day (QID) | ORAL | Status: DC | PRN

## 2019-03-28 MED ORDER — SODIUM CHLORIDE 0.9 % IV SOLN: INTRAVENOUS | Status: AC

## 2019-03-28 MED ORDER — METHOCARBAMOL 500 MG PO TABS
500.0000 mg | ORAL_TABLET | Freq: Four times a day (QID) | ORAL | Status: DC | PRN
  Administered 2019-03-28 – 2019-03-29 (×3): 500 mg via ORAL
  Filled 2019-03-28 (×3): qty 1

## 2019-03-28 MED ORDER — ONDANSETRON HCL 4 MG/2ML IJ SOLN: 4.0000 mg | Freq: Four times a day (QID) | INTRAMUSCULAR | Status: DC | PRN

## 2019-03-28 MED ORDER — LORAZEPAM 2 MG/ML IJ SOLN: 1.0000 mg | INTRAMUSCULAR | Status: DC | PRN

## 2019-03-28 MED ORDER — ACETAMINOPHEN 325 MG PO TABS
650.0000 mg | ORAL_TABLET | Freq: Four times a day (QID) | ORAL | Status: DC | PRN
  Administered 2019-03-28 (×2): 650 mg via ORAL
  Filled 2019-03-28 (×2): qty 2

## 2019-03-28 MED ORDER — LORAZEPAM 2 MG/ML IJ SOLN
1.0000 mg | Freq: Once | INTRAMUSCULAR | Status: AC | PRN
  Administered 2019-03-28: 1 mg via INTRAVENOUS
  Filled 2019-03-28: qty 1

## 2019-03-28 NOTE — H&P (Signed)
History and Physical    Jasmine [name] [id number] DOB: 1990-08-06 DOA: 03/27/2019  PCP: Jasmine Demark, PA-C  Patient coming from: Home.  Chief Complaint: Seizures.  HPI: Jasmine [name] is a 29 y.o. female with history of seizures chronic anemia migraine sickle cell trait sleep apnea presents to the ER with complaints of having seizures.  Patient was admitted in the hospital on March 4 discharged on March 5 after being admitted for seizures being noncompliant with medication at the time.  Patient had come to the ER again with seizures on March 10 at that time patient was loaded with Keppra and discharged home.  Patient presents to the ER again today with complaints of having seizures.  Also there was some concern for bruising of the right side of her eye.  While in the ER patient had a tonic-clonic seizure and was brought into the ER.  ED Course: Patient had a total of 2 seizures in the ER 1 with generalized tonic-clonic movement and another one with patient had exam by movements.  Further episodes were followed by a loss of consciousness.  Patient was given total of Ativan 4 mg IV and Keppra 1500 mg loading dose given after discussing neurologist on-call Jasmine [name].  On exam patient does have mild swelling around the right periorbital area.  Patient at the time of my exam was still lethargic from the medications and postictal state.  Unable to reach family.  Covid test pending.  Patient afebrile.  Labs show WBC count of 13.1 hemoglobin 9.7 potassium 2.9 for which patient is getting replacement.  Drug screen is positive for marijuana about 2 days ago.  Review of Systems: As per HPI, rest all negative.   Past Medical History:  Diagnosis Date  . Asthma   . Complication of anesthesia    "I wake up during anesthesia" (10/14/2015)  . DVT (deep vein thrombosis) in pregnancy   . Epilepsy ([place])   . GERD (gastroesophageal reflux disease)   . Heart murmur    last work up age 80- no  symtoms  . Pancreatitis   . Sickle cell trait ([place])   . Sleep apnea    "used to wear CPAP; don't have one here in [place] since I moved in 2014" (10/14/2015)    Past Surgical History:  Procedure Laterality Date  . APPENDECTOMY    . CESAREAN SECTION  2013  . CESAREAN SECTION N/A 11/08/2014   Procedure: CESAREAN SECTION;  Surgeon: Jasmine Mainland, DO;  Location: [facility];  Service: Obstetrics;  Laterality: N/A;  . CHOLECYSTECTOMY N/A 10/16/2015   Procedure: LAPAROSCOPIC CHOLECYSTECTOMY WITH POSSIBLE INTRAOPERATIVE CHOLANGIOGRAM;  Surgeon: Jasmine [name], Jasmine [name];  Location: [facility];  Service: General;  Laterality: N/A;  . CLOSED REDUCTION MANDIBLE WITH MANDIBULOMA N/A 07/17/2017   Procedure: REDUCTION TMJ WITH INTRAMAXILLARY FIXATION;  Surgeon: Jasmine [name], DDS;  Location: [facility];  Service: Oral Surgery;  Laterality: N/A;  . ESOPHAGOGASTRODUODENOSCOPY (EGD) WITH PROPOFOL N/A 11/15/2016   Procedure: ESOPHAGOGASTRODUODENOSCOPY (EGD) WITH PROPOFOL;  Surgeon: Jasmine [name], Jasmine [name];  Location: [facility];  Service: Endoscopy;  Laterality: N/A;  . INGUINAL HERNIA REPAIR Bilateral ~ 1996  . NERVE, TENDON AND ARTERY REPAIR Right 09/23/2012   Procedure: I&D and Repair As Necessary/Right Hand and Palm;  Surgeon: Jasmine [name], Jasmine [name];  Location: [facility];  Service: Orthopedics;  Laterality: Right;  . OMENTECTOMY N/A 12/25/2018   Procedure: OMENTECTOMY;  Surgeon: Jasmine [name], Jasmine [name];  Location: [facility];  Service: Gynecology;  Laterality: N/A;  .  SALPINGOOPHORECTOMY N/A 12/25/2018   Procedure: OPEN UNILATERAL  SALPINGO OOPHORECTOMY WITH STAGING;  Surgeon: Jasmine [name], Jasmine [name];  Location: [facility];  Service: Gynecology;  Laterality: N/A;  . TONSILLECTOMY       reports that she quit smoking about 3 years ago. Her smoking use included cigarettes. She has a 1.00 pack-year smoking history. She has never used smokeless tobacco. She reports that she does not drink alcohol or use drugs.  Allergies  Allergen Reactions  . Cheese  Shortness Of Breath  . Chocolate Shortness Of Breath  . Ibuprofen Hives and Shortness Of Breath    Children's ibuprofen  . Ivp Dye [Iodinated Diagnostic Agents] Hives, Shortness Of Breath and Itching  . Latex Anaphylaxis, Swelling and Other (See Comments)    Reaction:  Localized swelling   . Orange Juice [Orange Oil] Shortness Of Breath  . Other Hives and Other (See Comments)    Pt states that she is allergic to all steroids except IV solu-medrol.    Marland Kitchen Peach [Prunus Persica] Anaphylaxis  . Peanuts [Peanut Oil] Anaphylaxis  . Pear Anaphylaxis  . Prednisone Hives  . Raspberry Anaphylaxis  . Tylenol [Acetaminophen] Hives, Shortness Of Breath and Other (See Comments)    Pt states that this is only with the liquid form  . Amoxicillin Hives and Other (See Comments)    Has patient had a PCN reaction causing immediate rash, facial/tongue/throat swelling, SOB or lightheadedness with hypotension: No Has patient had a PCN reaction causing severe rash involving mucus membranes or skin necrosis: No Has patient had a PCN reaction that required hospitalization: No Has patient had a PCN reaction occurring within the last 10 years: No If all of the above answers are "NO", then may proceed with Cephalosporin use.  Marland Kitchen Apricot Flavor Hives  . Doxycycline Hives  . Erythromycin Hives  . Milk Of Magnesia [Magnesium Hydroxide] Hives and Itching  . Penicillins Hives and Other (See Comments)    Has patient had a PCN reaction causing immediate rash, facial/tongue/throat swelling, SOB or lightheadedness with hypotension: No Has patient had a PCN reaction causing severe rash involving mucus membranes or skin necrosis: No Has patient had a PCN reaction that required hospitalization: No Has patient had a PCN reaction occurring within the last 10 years: No If all of the above answers are "NO", then may proceed with Cephalosporin use.    Family History  Problem Relation Age of Onset  . Cancer Mother         cervical cancer  . Asthma Mother   . Hypertension Father   . Sickle cell anemia Father   . Asthma Sister   . Diabetes Maternal Aunt   . Lymphoma Maternal Grandmother     Prior to Admission medications   Medication Sig Start Date End Date Taking? Authorizing Provider  albuterol (PROVENTIL HFA;VENTOLIN HFA) 108 (90 Base) MCG/ACT inhaler Inhale 2 puffs into the lungs every 4 (four) hours as needed for wheezing or shortness of breath. 08/20/17   Jasmine Demark, PA-C  Ascorbic Acid (VITAMIN C) 100 MG CHEW Chew 1 tablet (100 mg total) by mouth daily. 08/20/17   Jasmine Demark, PA-C  cetirizine (ZYRTEC) 10 MG tablet Take 1 tablet (10 mg total) by mouth daily. 12/11/17   Tasia Catchings, Amy V, PA-C  docusate sodium (COLACE) 50 MG capsule Take 1 capsule (50 mg total) by mouth 2 (two) times daily as needed for mild constipation. 08/20/17   Jasmine Demark, PA-C  dornase alpha (PULMOZYME) 1 MG/ML  nebulizer solution Take 2.5 mLs (2.5 mg total) by nebulization 2 (two) times daily. 08/20/17   Jasmine Demark, PA-C  feeding supplement, ENSURE ENLIVE, (ENSURE ENLIVE) LIQD Take 237 mLs by mouth 2 (two) times daily between meals. 474 mL 3 times a day, provide 3-week supply 08/20/17   Jasmine Demark, PA-C  ferrous sulfate 325 (65 FE) MG tablet Take 1 tablet (325 mg total) by mouth daily. 03/20/19 03/19/20  Caren Griffins, Jasmine [name]  Fluticasone-Salmeterol (ADVAIR) 500-50 MCG/DOSE AEPB Inhale 1 puff into the lungs 2 (two) times daily. 08/20/17   Jasmine Demark, PA-C  levETIRAcetam (KEPPRA) 750 MG tablet Take 1 tablet (750 mg total) by mouth 2 (two) times daily. 03/20/19   Caren Griffins, Jasmine [name]  ondansetron (ZOFRAN ODT) 8 MG disintegrating tablet Take 1 tablet (8 mg total) by mouth every 8 (eight) hours as needed for nausea or vomiting. 12/28/18   Everitt Amber, Jasmine [name]  senna-docusate (SENOKOT-S) 8.6-50 MG tablet Take 2 tablets by mouth at bedtime. For AFTER surgery, do not take if having diarrhea 12/26/18   Cross, Lenna Sciara D, NP    SUMAtriptan (IMITREX) 50 MG tablet Take 1 tablet (50 mg total) by mouth daily. May take one more tablet two hours after the first. No more than two tablets per day. 01/06/18   Gildardo Pounds, NP  topiramate (TOPAMAX) 50 MG tablet Take 1 tablet (50 mg total) by mouth at bedtime. 01/06/18 03/25/19  Gildardo Pounds, NP    Physical Exam: Constitutional: Moderately built and nourished. Vitals:   03/27/19 2138 03/27/19 2300 03/27/19 2345  BP: 131/81 121/74 103/68  Pulse: 87  93  Resp: 16    Temp: 98.2 F (36.8 C)    TempSrc: Oral    SpO2: 100%     Eyes: Anicteric no pallor.  Mild swelling around the right eye. ENMT: No discharge from the ears eyes nose or mouth. Neck: No masses.  No neck rigidity. Respiratory: No rhonchi or crepitations. Cardiovascular: S1-S2 heard. Abdomen: Soft nontender bowel sounds present. Musculoskeletal: No edema. Skin: No rash. Neurologic: Patient is lethargic arousable on painful stimuli.  Pupils reacting to light. Psychiatric: Lethargic.   Labs on Admission: I have personally reviewed following labs and imaging studies  CBC: Recent Labs  Lab 03/25/19 1330 03/27/19 2230  WBC 18.3* 13.1*  NEUTROABS 12.0* 7.9*  HGB 10.5* 9.7*  HCT 35.7* 31.7*  MCV 77.8* 75.7*  PLT 372 [id number]   Basic Metabolic Panel: Recent Labs  Lab 03/25/19 1330 03/27/19 2230  NA 137 137  K 3.4* 2.9*  CL 104 101  CO2 14* 26  GLUCOSE 117* 95  BUN 7 7  CREATININE 0.93 0.63  CALCIUM 8.9 9.0   GFR: Estimated Creatinine Clearance: 95 mL/min (by C-G formula based on SCr of 0.63 mg/dL). Liver Function Tests: Recent Labs  Lab 03/25/19 1330 03/27/19 2230  AST 29 26  ALT 12 13  ALKPHOS 49 51  BILITOT 0.3 0.6  PROT 6.9 7.4  ALBUMIN 4.2 4.4   No results for input(s): LIPASE, AMYLASE in the last 168 hours. No results for input(s): AMMONIA in the last 168 hours. Coagulation Profile: No results for input(s): INR, PROTIME in the last 168 hours. Cardiac Enzymes: No results  for input(s): CKTOTAL, CKMB, CKMBINDEX, TROPONINI in the last 168 hours. BNP (last 3 results) No results for input(s): PROBNP in the last 8760 hours. HbA1C: No results for input(s): HGBA1C in the last 72 hours. CBG: Recent Labs  Lab 03/25/19 1316 03/27/19  2222  GLUCAP 116* 104*   Lipid Profile: No results for input(s): CHOL, HDL, LDLCALC, TRIG, CHOLHDL, LDLDIRECT in the last 72 hours. Thyroid Function Tests: No results for input(s): TSH, T4TOTAL, FREET4, T3FREE, THYROIDAB in the last 72 hours. Anemia Panel: No results for input(s): VITAMINB12, FOLATE, FERRITIN, TIBC, IRON, RETICCTPCT in the last 72 hours. Urine analysis:    Component Value Date/Time   COLORURINE YELLOW 03/25/2019 1622   APPEARANCEUR HAZY (A) 03/25/2019 1622   LABSPEC 1.016 03/25/2019 1622   PHURINE 5.0 03/25/2019 1622   GLUCOSEU NEGATIVE 03/25/2019 1622   HGBUR NEGATIVE 03/25/2019 1622   BILIRUBINUR NEGATIVE 03/25/2019 1622   KETONESUR 5 (A) 03/25/2019 1622   PROTEINUR NEGATIVE 03/25/2019 1622   UROBILINOGEN 1.0 11/04/2014 0946   NITRITE NEGATIVE 03/25/2019 1622   LEUKOCYTESUR NEGATIVE 03/25/2019 1622   Sepsis Labs: @LABRCNTIP (procalcitonin:4,lacticidven:4) ) Recent Results (from the past 240 hour(s))  Respiratory Panel by RT PCR (Flu A&B, Covid) - Nasopharyngeal Swab     Status: None   Collection Time: 03/19/19  8:42 PM   Specimen: Nasopharyngeal Swab  Result Value Ref Range Status   SARS Coronavirus 2 by RT PCR NEGATIVE NEGATIVE Final    Comment: (NOTE) SARS-CoV-2 target nucleic acids are NOT DETECTED. The SARS-CoV-2 RNA is generally detectable in upper respiratoy specimens during the acute phase of infection. The lowest concentration of SARS-CoV-2 viral copies this assay can detect is 131 copies/mL. A negative result does not preclude SARS-Cov-2 infection and should not be used as the sole basis for treatment or other patient management decisions. A negative result may occur with  improper  specimen collection/handling, submission of specimen other than nasopharyngeal swab, presence of viral mutation(s) within the areas targeted by this assay, and inadequate number of viral copies (<131 copies/mL). A negative result must be combined with clinical observations, patient history, and epidemiological information. The expected result is Negative. Fact Sheet for Patients:  PinkCheek.be Fact Sheet for Healthcare Providers:  GravelBags.it This test is not yet ap proved or cleared by the [place] FDA and  has been authorized for detection and/or diagnosis of SARS-CoV-2 by FDA under an Emergency Use Authorization (EUA). This EUA will remain  in effect (meaning this test can be used) for the duration of the COVID-19 declaration under Section 564(b)(1) of the Act, 21 U.S.C. section 360bbb-3(b)(1), unless the authorization is terminated or revoked sooner.    Influenza A by PCR NEGATIVE NEGATIVE Final   Influenza B by PCR NEGATIVE NEGATIVE Final    Comment: (NOTE) The Xpert Xpress SARS-CoV-2/FLU/RSV assay is intended as an aid in  the diagnosis of influenza from Nasopharyngeal swab specimens and  should not be used as a sole basis for treatment. Nasal washings and  aspirates are unacceptable for Xpert Xpress SARS-CoV-2/FLU/RSV  testing. Fact Sheet for Patients: PinkCheek.be Fact Sheet for Healthcare Providers: GravelBags.it This test is not yet approved or cleared by the [place] FDA and  has been authorized for detection and/or diagnosis of SARS-CoV-2 by  FDA under an Emergency Use Authorization (EUA). This EUA will remain  in effect (meaning this test can be used) for the duration of the  Covid-19 declaration under Section 564(b)(1) of the Act, 21  U.S.C. section 360bbb-3(b)(1), unless the authorization is  terminated or revoked. Performed at [facility], [place] [address], [place],  [address]   MRSA PCR Screening     Status: None   Collection Time: 03/19/19 10:33 PM   Specimen: Nasal Mucosa; Nasopharyngeal  Result  Value Ref Range Status   MRSA by PCR NEGATIVE NEGATIVE Final    Comment:        The GeneXpert MRSA Assay (FDA approved for NASAL specimens only), is one component of a comprehensive MRSA colonization surveillance program. It is not intended to diagnose MRSA infection nor to guide or monitor treatment for MRSA infections. Performed at [facility], [place] [address], [place], [place] [address]      Radiological Exams on Admission: No results found.    Assessment/Plan Principal Problem:   Status epilepticus ([place])    1. Status epilepticus -not sure if patient was compliant with her medication.  At this time I discussed with on-call neurologist Jasmine [name] who advised to increase regular Keppra dose from 750 twice a day to 1000 mg twice a day.  Patient already received 1500 mg as a loading dose.  Check CT head and EEG and care Ativan for seizure.  Seizure precautions. 2. Hypokalemia cause not clear.  Replace and recheck.  Check magnesium level with next blood draw. 3. Chronic anemia follow CBC. 4. History of asthma presently not wheezing.  Given that patient has been a recurrent seizures.  Admit to inpatient and close monitoring for any deterioration.  Covid test is pending.   DVT prophylaxis: SCDs for now.  CT it is pending. Code Status: Full code. Family Communication: Unable to reach family. Disposition Plan: Home. Consults called: Neurology. Admission status: Inpatient.   Rise Patience Jasmine [name] [facility] Pager [phone].  If 7PM-7AM, please contact night-coverage [email or web address] Password [facility]  03/28/2019, 1:59 AM

## 2019-03-28 NOTE — Consult Note (Addendum)
Neurology Consultation  Reason for Consult: Seizures Referring Physician: Dr. Hal Hope  CC: Seizures  History is obtained from: Chart review  HPI: Jasmine [name] is a 29 y.o. female past medical history of seizures, on Keppra reportedly compliant with medications, sickle cell trait, cystic fibrosis, presented to [facility], ER for breakthrough seizures. According to the chart review, she had a seizure at home and came to the hospital for evaluation.  In the hospital he had another seizure with tonic posturing eyes rolled back in a short period of apnea lasting about 30 seconds during which she was initially unresponsive but then became more awake but agitated or combative not returning back to baseline. She was given Ativan multiple times. I was called over the phone and I recommended loading her with Keppra 1.5 g IV x1 and observing her in the ER and if she does not return to baseline admit her.  She was admitted to [facility] and I was notified by the hospitalist of arrival. She was unable to provide me with any meaningful history at this time. I tried to call the listed contact on the chart, who did not answer the phone and had a voicemail box that is not set to receive voicemail. Patient did say that she feels that her Keppra is not working.  She nodded yes when asked if she has been taking her medications regularly.  She was also able to tell me in a mumbling voice that she has not had seizures since childhood.  She had presented to the ER 2 days ago with complaints of seizures and had left AMA. Her notes from [facility] last year where she was admitted for presumable cystic fibrosis/respiratory issues, she also had left AMA. Unclear what her neurological care is established at.   ROS: Unable to obtain due to altered mental status.   Past Medical History:  Diagnosis Date  . Asthma   . Complication of anesthesia    "I wake up during anesthesia" (10/14/2015)  . DVT  (deep vein thrombosis) in pregnancy   . Epilepsy ([place])   . GERD (gastroesophageal reflux disease)   . Heart murmur    last work up age 29- no symtoms  . Pancreatitis   . Sickle cell trait ([place])   . Sleep apnea    "used to wear CPAP; don't have one here in [place] since I moved in 2014" (10/14/2015)     Family History  Problem Relation Age of Onset  . Cancer Mother        cervical cancer  . Asthma Mother   . Hypertension Father   . Sickle cell anemia Father   . Asthma Sister   . Diabetes Maternal Aunt   . Lymphoma Maternal Grandmother    Social History:   reports that she quit smoking about 3 years ago. Her smoking use included cigarettes. She has a 1.00 pack-year smoking history. She has never used smokeless tobacco. She reports that she does not drink alcohol or use drugs.  Medications  Current Facility-Administered Medications:  .  0.9 %  sodium chloride infusion, , Intravenous, Continuous, Rise Patience, MD, Last Rate: 75 mL/hr at 03/28/19 0513, New Bag at 03/28/19 0513 .  acetaminophen (TYLENOL) tablet 650 mg, 650 mg, Oral, Q6H PRN **OR** acetaminophen (TYLENOL) suppository 650 mg, 650 mg, Rectal, Q6H PRN, Rise Patience, MD .  levETIRAcetam (KEPPRA) IVPB 1000 mg/100 mL premix, 1,000 mg, Intravenous, BID, Rise Patience, MD .  LORazepam (ATIVAN) injection  1 mg, 1 mg, Intravenous, Q4H PRN, Rise Patience, MD .  ondansetron [facility]) tablet 4 mg, 4 mg, Oral, Q6H PRN **OR** ondansetron (ZOFRAN) injection 4 mg, 4 mg, Intravenous, Q6H PRN, Rise Patience, MD  Exam: Current vital signs: BP 104/69   Pulse 75   Temp 97.9 F (36.6 C) (Oral)   Resp 16   SpO2 99%  Vital signs in last 24 hours: Temp:  [97.9 F (36.6 C)-98.2 F (36.8 C)] 97.9 F (36.6 C) (03/13 0500) Pulse Rate:  [75-93] 75 (03/13 0500) Resp:  [15-21] 16 (03/13 0345) BP: (103-131)/(64-81) 104/69 (03/13 0345) SpO2:  [99 %-100 %] 99 % (03/13 0345) General: Sleeping in bed, in no  distress HEENT: Normocephalic, bruising over the right eyelid. Cardiovascular: Regular rate rhythm Abdomen: Nondistended nontender Extremities: Warm well perfused Neurological examination: Sleeping, does not open eyes to voice. To noxious stimulation, wakes up and says that lights are bothering her and requests to lower the light intensity. Pupils are equal round reactive to light. No gaze preference or deviation Face appears symmetric Able to answer some yes or no questions unable to tell me that she has seizures since childhood and a very hypophonic voice. Moving all 4 extremities but inattentive and let them drop to hit the bed in less than 10 and 5 seconds for upper and lower extremity respectively. To noxious stimulation, says that she is able to feel everything and everything feels sore. Unable to test coordination or gait at this time   Labs I have reviewed labs in epic and the results pertinent to this consultation are:  CBC    Component Value Date/Time   WBC 13.1 (H) 03/27/2019 2230   RBC 4.19 03/27/2019 2230   HGB 9.7 (L) 03/27/2019 2230   HGB 9.9 (L) 12/22/2018 1356   HGB 11.9 08/20/2017 1144   HCT 31.7 (L) 03/27/2019 2230   HCT 38.0 08/20/2017 1144   PLT 312 03/27/2019 2230   PLT 257 12/22/2018 1356   PLT 294 08/20/2017 1144   MCV 75.7 (L) 03/27/2019 2230   MCV 87 08/20/2017 1144   MCH 23.2 (L) 03/27/2019 2230   MCHC 30.6 03/27/2019 2230   RDW 18.7 (H) 03/27/2019 2230   RDW 14.3 08/20/2017 1144   LYMPHSABS 4.2 (H) 03/27/2019 2230   LYMPHSABS 3.1 08/20/2017 1144   MONOABS 0.8 03/27/2019 2230   EOSABS 0.2 03/27/2019 2230   EOSABS 0.3 08/20/2017 1144   BASOSABS 0.1 03/27/2019 2230   BASOSABS 0.0 08/20/2017 1144  WBC 13.1, hemoglobin 9.7, potassium 2.9, urinary toxicology screen positive for benzodiazepines and THC   CMP     Component Value Date/Time   NA 137 03/27/2019 2230   NA 141 08/20/2017 1144   K 2.9 (L) 03/27/2019 2230   CL 101 03/27/2019 2230    CO2 26 03/27/2019 2230   GLUCOSE 95 03/27/2019 2230   BUN 7 03/27/2019 2230   BUN 10 08/20/2017 1144   CREATININE 0.63 03/27/2019 2230   CALCIUM 9.0 03/27/2019 2230   PROT 7.4 03/27/2019 2230   PROT 7.1 08/20/2017 1144   ALBUMIN 4.4 03/27/2019 2230   ALBUMIN 4.4 08/20/2017 1144   AST 26 03/27/2019 2230   ALT 13 03/27/2019 2230   ALKPHOS 51 03/27/2019 2230   BILITOT 0.6 03/27/2019 2230   BILITOT <0.2 08/20/2017 1144   GFRNONAA >60 03/27/2019 2230   GFRAA >60 03/27/2019 2230    Lipid Panel     Component Value Date/Time   TRIG 213 (H) 04/22/2015 [id number]  Imaging I have reviewed the images obtained:  CT-scan of the brain-03/19/2019 with no acute changes.  There was a right forehead periorbital hematoma seen then.  Assessment:  29 year old with a history of seizure disorder, unclear what her neurological follow-up is, on Keppra 750 twice daily, presenting with breakthrough seizures now for third or fourth time in the past few weeks.  Apparently clinically presented in status epilepticus at the outside hospital, which resolved with treatment. Remains very somnolent as she has received multiple doses of Keppra but does not appear to be seizing clinically at this time. Examination was consistent with diffuse encephalopathy but was nonfocal otherwise. Has been admitted for further work-up. No records from out-of-state are available-upon review of outside charts it appears that she moved here from [place] but I have no neurological notes or EEG results from anywhere.  Suspect breakthrough seizures due to noncompliance to medication versus THC overuse.  Impression: -Status epilepticus-resolved -Breakthrough seizures-etiology unclear.   Recommendations: -On the phone, earlier in the night, I had recommended that she be loaded with Keppra 1.5 g IV x1 which was done at the [facility] [place] the dose to 1 g twice daily from current dose of 750 twice daily at  home. -Routine EEG in the morning -I would also do MRI brain with and without contrast as I do not have any detailed brain imaging and she has had multiple seizures. -Maintain seizure precautions -Try to gather more history from family as well as any old neurological records including clinic notes, imaging and EEG results -I attempted to call the number listed for spouse Theora Gianotti but the phone is not set to receive any voice messages and I could not leave a voice message.  I have not had any callback on the phone when I called from.  Discussed my plan with Dr. Hal Hope over the phone.  -- Amie [place], MD [facility] Pager: [phone] If 7pm to 7am, please call on call as listed on AMION.

## 2019-03-28 NOTE — Progress Notes (Signed)
Care started prior to midnight in the emergency room and patient was admitted early this morning after midnight and I am in current agreement with assessment and plan done by Dr. Gean Birchwood.  Additional changes to the plan of care have been made accordingly.  The patient is a 29 year old African-American female with a past medical history of seizure disorder, chronic anemia, migraines, history of sickle cell trait as well as sleep apnea who presented to the ER with seizures.  She was admitted to the hospital on March 4 and discharged on March 5 after being admitted for seizures and being noncompliant with medication at that time.  Patient presented to the ER again with seizures on March 10 and she was loaded with Keppra and discharged home.  Today she again presents with a complaint of seizures and there is some bruising on her right side of her eye.  While in the ED she had a tonic-clonic seizure.  The patient had a total of 2 seizures in the ED followed by loss of consciousness.  She was given 4 mg of IV Ativan and 1500 mg Keppra loading dose.  Dr. Reuben Likes was consulted for further evaluation recommendations and patient was postictal at the time of Dr. Moise Boring evaluation.  Status epilepticus and breakthrough seizures and neurology recommended increasing her Keppra dose to 1 g twice daily and obtain an EEG as well as an MRI of the brain with and without contrast.  She will be maintained on seizure precautions and will have as needed Ativan for seizures.   Of note she has been admitted for the following in being treated for but not limited to:  Status Epilepticus, resolved Breakthrough Seizures -Not sure if patient was compliant with her medication.  ' -Dr. Hal Hope discussed with on-call neurologist Dr. Amie Portland who advised to increase regular Keppra dose from 750 twice a day to 1000 mg twice a day.   -Head CT without contrast showed "No acute finding or change from prior. No cortical  finding to explain seizure." -Patient already received 1500 mg as a loading dose in [place] MRI of the Brain w/wo Contras and Routine EEG  -C/w PRN Ativan for seizure.  -C/w Seizure precautions. -Currently on NS at 75 mL/hr; Received a Bolus of 500 mL/hr -Continue to Monitor Closely   Hypokalemia  -Cause not clear but improved as K+ is now 3.7 after repletion with IV KCl 20 mEQ -Continue to Monitor and Replete as Necessary -Repeat CMP in AM  Chronic Microcytic Anemia  -Patient's Hgb/Hct was 9.7/31.7 on Admission and repeat this AM was 9.6/31.5 -Check Anemia Panel -Continue to Monitor for S/Sx of Bleeding; Currently no Overt Bleeding Noted -Repeat CBC in AM   History of Asthma  -Presently not wheezing.  Metabolic Acidosis -Patient CO2 is now 21, chloride level is 108, and anion gap is 9 -continue to monitor and trend and repeat CBC in a.m.  Leukocytosis, improving  -Likely in the setting of her seizure does order an active seizures-expect to improve -Currently no signs or symptoms of infection -Patient's WBC is 13.7 and now improved to 10.8 -Repeat CBC in a.m.  Marijuana Abuse -UDS was positive for [facility] -When patient is more alert we will give counseling  On my examination this AM she is sleeping and does not respond to physical or verbal stimuli as she is post-ictal. We will Continue to Monitor the patient's clinical response to intervention and follow-up with neurology recommendations.  We will repeat blood  work in the a.m. and follow-up on her MRI that has been ordered an EEG

## 2019-03-28 NOTE — Procedures (Signed)
ELECTROENCEPHALOGRAM REPORT   Patient: Jasmine [name]       Room #: 4NP07C EEG No. ID: [id number] Age: 29 y.o.        Sex: female Requesting Physician: Alfredia Ferguson Report Date:  03/28/2019        Interpreting Physician: Alexis Goodell  History: Jasmine [name] is an 29 y.o. female with a history of seizures presenting with breakthrough  seizures  Medications:  Keppra  Conditions of Recording:  This is a 21 channel routine scalp EEG performed with bipolar and monopolar montages arranged in accordance to the international 10/20 system of electrode placement. One channel was dedicated to EKG recordings.  The patient is in the awake and drowsy states.  Description:  There are frequent periods of beta artifact that obscure the background at times.  The background activity is dominated by alpha activity.  Some intermixed teta and delta activity are noted occasionally as well.   The patient drowses with slowing to irregular, low voltage theta and delta activity.   Stage II sleep is not obtained. No epileptiform activity is noted.   Hyperventilation was not performed.  Intermittent photic stimulation was performed but failed to illicit any change in the tracing.    IMPRESSION: This electroencephalogram is consistent with medication effect.  No epileptiform activity is noted.      Alexis Goodell, MD Neurology [phone] 03/28/2019, 6:44 PM

## 2019-03-28 NOTE — Progress Notes (Signed)
EEG complete - results pending 

## 2019-03-29 DIAGNOSIS — D649 Anemia, unspecified: Secondary | ICD-10-CM

## 2019-03-29 DIAGNOSIS — R569 Unspecified convulsions: Secondary | ICD-10-CM

## 2019-03-29 LAB — CBC WITH DIFFERENTIAL/PLATELET
Abs Immature Granulocytes: 0.03 10*3/uL (ref 0.00–0.07)
Basophils Absolute: 0 10*3/uL (ref 0.0–0.1)
Basophils Relative: 0 %
Eosinophils Absolute: 0.2 10*3/uL (ref 0.0–0.5)
Eosinophils Relative: 2 %
HCT: 30.6 % — ABNORMAL LOW (ref 36.0–46.0)
Hemoglobin: 9.3 g/dL — ABNORMAL LOW (ref 12.0–15.0)
Immature Granulocytes: 0 %
Lymphocytes Relative: 32 %
Lymphs Abs: 2.5 10*3/uL (ref 0.7–4.0)
MCH: 22.8 pg — ABNORMAL LOW (ref 26.0–34.0)
MCHC: 30.4 g/dL (ref 30.0–36.0)
MCV: 75 fL — ABNORMAL LOW (ref 80.0–100.0)
Monocytes Absolute: 0.4 10*3/uL (ref 0.1–1.0)
Monocytes Relative: 6 %
Neutro Abs: 4.6 10*3/uL (ref 1.7–7.7)
Neutrophils Relative %: 60 %
Platelets: 292 10*3/uL (ref 150–400)
RBC: 4.08 MIL/uL (ref 3.87–5.11)
RDW: 18.7 % — ABNORMAL HIGH (ref 11.5–15.5)
WBC: 7.7 10*3/uL (ref 4.0–10.5)
nRBC: 0 % (ref 0.0–0.2)

## 2019-03-29 LAB — COMPREHENSIVE METABOLIC PANEL
ALT: 23 U/L (ref 0–44)
AST: 27 U/L (ref 15–41)
Albumin: 3.2 g/dL — ABNORMAL LOW (ref 3.5–5.0)
Alkaline Phosphatase: 45 U/L (ref 38–126)
Anion gap: 7 (ref 5–15)
BUN: <5 mg/dL — ABNORMAL LOW (ref 6–20)
CO2: 22 mmol/L (ref 22–32)
Calcium: 8.7 mg/dL — ABNORMAL LOW (ref 8.9–10.3)
Chloride: 108 mmol/L (ref 98–111)
Creatinine, Ser: 0.52 mg/dL (ref 0.44–1.00)
GFR calc Af Amer: >60 mL/min (ref >60)
GFR calc non Af Amer: >60 mL/min (ref >60)
Glucose, Bld: 87 mg/dL (ref 70–99)
Potassium: 3.3 mmol/L — ABNORMAL LOW (ref 3.5–5.1)
Sodium: 137 mmol/L (ref 135–145)
Total Bilirubin: 0.6 mg/dL (ref 0.3–1.2)
Total Protein: 6 g/dL — ABNORMAL LOW (ref 6.5–8.1)

## 2019-03-29 LAB — IRON AND TIBC
Iron: 23 ug/dL — ABNORMAL LOW (ref 28–170)
Saturation Ratios: 5 % — ABNORMAL LOW (ref 10.4–31.8)
TIBC: 504 ug/dL — ABNORMAL HIGH (ref 250–450)
UIBC: 481 ug/dL

## 2019-03-29 LAB — FOLATE: Folate: 7.8 ng/mL (ref >5.9)

## 2019-03-29 LAB — FERRITIN: Ferritin: 11 ng/mL (ref 11–307)

## 2019-03-29 LAB — MAGNESIUM: Magnesium: 2 mg/dL (ref 1.7–2.4)

## 2019-03-29 LAB — RETICULOCYTES
Immature Retic Fract: 26.1 % — ABNORMAL HIGH (ref 2.3–15.9)
RBC.: 4.56 MIL/uL (ref 3.87–5.11)
Retic Count, Absolute: 69.8 10*3/uL (ref 19.0–186.0)
Retic Ct Pct: 1.5 % (ref 0.4–3.1)

## 2019-03-29 LAB — PHOSPHORUS: Phosphorus: 3.3 mg/dL (ref 2.5–4.6)

## 2019-03-29 LAB — GLUCOSE, CAPILLARY
Glucose-Capillary: 88 mg/dL (ref 70–99)
Glucose-Capillary: 93 mg/dL (ref 70–99)
Glucose-Capillary: 99 mg/dL (ref 70–99)

## 2019-03-29 LAB — VITAMIN B12: Vitamin B-12: 325 pg/mL (ref 180–914)

## 2019-03-29 MED ORDER — POTASSIUM CHLORIDE CRYS ER 20 MEQ PO TBCR
40.0000 meq | EXTENDED_RELEASE_TABLET | Freq: Two times a day (BID) | ORAL | Status: DC
  Administered 2019-03-29: 10:00:00 40 meq via ORAL
  Filled 2019-03-29: qty 2

## 2019-03-29 MED ORDER — LEVETIRACETAM 1000 MG PO TABS: 1000.0000 mg | ORAL_TABLET | Freq: Two times a day (BID) | ORAL | 0 refills | Status: DC

## 2019-03-29 MED ORDER — FERROUS SULFATE 325 (65 FE) MG PO TABS: 325.0000 mg | ORAL_TABLET | Freq: Every day | ORAL | 0 refills | Status: DC

## 2019-03-29 NOTE — Discharge Summary (Signed)
Physician Discharge Summary  Jasmine [name] [id number] DOB: July 28, 1990 DOA: 03/27/2019  PCP: Clent Demark, PA-C  Admit date: 03/27/2019 Discharge date: 03/29/2019  Admitted From: Home Disposition: Home  Recommendations for Outpatient Follow-up:  1. Follow up with PCP in 1-2 weeks and 2. Follow up with Neurology in 1-2 weeks 3. We will need to obtain Neurological records from [facility] in [place] but not limited to clinic notes, imaging and EEG results 4. Do Not Drive for at least 6 months and follow DMV guidelines:Per [facility] statutes, patients with seizures are not allowed to drive until  they have been seizure-free for six months. Use caution when using heavy equipment or power tools. Avoid working on ladders or at heights. Take showers instead of baths. Ensure the water temperature is not too high on the home water heater. Do not go swimming alone. When caring for infants or small children, sit down when holding, feeding, or changing them to minimize risk of injury to the child in the event you have a seizure. Also, Maintain good sleep hygiene. Avoid alcohol 5. Please obtain CMP/CBC, Mag, Phos in one week 6. Please follow up on the following pending results:  Home Health: No Equipment/Devices: None  Discharge Condition: Stable CODE STATUS: FULL CODE  Diet recommendation: Regular Diet   Brief/Interim Summary: The patient is a 29 year old African-American female with a past medical history of seizure disorder, chronic anemia, migraines, history of sickle cell trait as well as sleep apnea and other comorbidities who presented to the ER with seizures.  She was admitted to the hospital on March 4 and discharged on March 5 after being admitted for seizures and being noncompliant with medication at that time.  Patient presented to the ER again with seizures on March 10 and she was loaded with Keppra and discharged home.  Today she again  presents with a complaint of seizures and there is some bruising on her right side of her eye.  While in the ED she had a tonic-clonic seizure.  The patient had a total of 2 seizures in the ED followed by loss of consciousness.  She was given 4 mg of IV Ativan and 1500 mg Keppra loading dose.  Dr. Amie Portland was consulted for further evaluation recommendations and patient was postictal at the time of Dr. Moise Boring evaluation.  She was admitted for Status epilepticus and breakthrough seizures and neurology recommended increasing her Keppra dose to 1 g twice daily and obtain an EEG as well as an MRI of the brain with and without contrast.    Her seizures were stopped and she was postictal and somnolent after the lorazepam administration.  She was maintained on seizure precautions and will have as needed Ativan for seizures further work-up was done and she had an MRI of the brain with contrast which was normal and showed no acute focal lesions to explain his seizures.  EEG was also done which showed no epileptiform activity.  Neurology had recommended increasing Keppra from 750 mg p.o. twice daily to 1000 g p.o. twice daily which will be done.  She will need to follow-up with [facility] and we have sent an ambulatory referral for.  We will also need to obtain medical records from her [facility] in [place] this can be done in the outpatient setting with PCP and neurology.  She is deemed stable for discharge at this time will need to follow-up with her PCP  as well as neurology in the outpatient setting.  Discharge Diagnoses:  Principal Problem:   Status epilepticus ([place])  Status Epilepticus, resolved Breakthrough Seizures -Not sure if patient was compliant with her medication. ' -Dr. Hal Hope discussed with on-call neurologist Dr. Amie Portland who advised to increase regular Keppra dose from 750 twice a day to 1000 mg twice a day.  He was given his IV  initially but now changed to p.o.-Head CT without contrast showed "No acute finding or change from prior. No cortical finding to explain seizure." -Patient already received 1500 mg as a loading dose in [place] MRI of the Brain w/wo Contras and Routine EEG  -MRI done and showed "Normal MRI appearance of the brain. No acute or focal lesion to explain seizures. Left frontal sinus opacification." -EEG done and showed that the EEG was consistent with medication effect and no epileptiform activity was noted -C/w PRN Ativan for seizure while hospitalized -C/w Seizure precautions. -Was on  NS at 75 mL/hr but now stopped; Received a Bolus of 500 mL/hr -Continue to Monitor Closely  and will make an ambulatory referral to [facility] in 1 to 2 weeks for close follow-up -Avoid alcohol and illicit substances and recommended compliance with medications.  Hypokalemia  -Cause not clear but improved as K+ was now 3.7 after repletion with IV KCl 20 mEQ today -Today it is 3.3 and will give p.o. potassium chloride 40 mill colons twice daily x2 doses prior to discharge -Continue to Monitor and Replete as Necessary -Repeat CMP within 1 week  Chronic Microcytic Anemia  -Patient's Hgb/Hct was 9.7/31.7 on Admission and repeat this AM was 9.3/30.6 -Checked Anemia Panel and showed an iron level of 23, U IBC of 41, TIBC of 504, saturation ratios of 5%, ferritin level of 11, folate level 7.8, and vitamin B12 level of 325 -Will resume Ferrous sulfate 325 mg p.o. daily -Continue to Monitor for S/Sx of Bleeding; Currently no Overt Bleeding Noted -Repeat CBC in AM   History of Asthma  -Presently not wheezing -Resume on home inhalers and nebulizers as needed at discharge  Metabolic Acidosis, improved -Patient CO2 is now 22, chloride level is 108, and anion gap is 7 -Continue to monitor and trend and repeat CMP within 1 week  Leukocytosis, improving  -Likely in the setting of her seizure does order an  active seizures-expect to improve -Currently no signs or symptoms of infection -Patient's WBC went from 13.7 -> 10.8 -> 7.7 -Repeat CBC in a.m.  Marijuana Abuse -UDS was positive for THC -Counseling given and this could have led to her breakthrough seizure -Avoid illicit substances  Discharge Instructions Discharge Instructions    Ambulatory referral to Neurology   Complete by: As directed    An appointment is requested in approximately: 1-2 Weeks for Seizures   Call MD for:  difficulty breathing, headache or visual disturbances   Complete by: As directed    Call MD for:  extreme fatigue   Complete by: As directed    Call MD for:  hives   Complete by: As directed    Call MD for:  persistant dizziness or light-headedness   Complete by: As directed    Call MD for:  persistant nausea and vomiting   Complete by: As directed    Call MD for:  redness, tenderness, or signs of infection (pain, swelling, redness, odor or green/yellow discharge around incision site)   Complete by: As directed    Call MD for:  severe uncontrolled  pain   Complete by: As directed    Call MD for:  temperature >100.4   Complete by: As directed    Diet general   Complete by: As directed    Discharge instructions   Complete by: As directed    You were cared for by a hospitalist during your hospital stay. If you have any questions about your discharge medications or the care you received while you were in the hospital after you are discharged, you can call the unit and ask to speak with the hospitalist on call if the hospitalist that took care of you is not available. Once you are discharged, your primary care physician will handle any further medical issues. Please note that NO REFILLS for any discharge medications will be authorized once you are discharged, as it is imperative that you return to your primary care physician (or establish a relationship with a primary care physician if you do not have one) for your  aftercare needs so that they can reassess your need for medications and monitor your lab values.  Follow up with PCP and Neurology within 1-2 weeks. Take all medications as prescribed. If symptoms change or worsen please return to the ED for evaluation   Per [facility] statutes, patients with seizures are not allowed to drive until  they have been seizure-free for six months. Use caution when using heavy equipment or power tools. Avoid working on ladders or at heights. Take showers instead of baths. Ensure the water temperature is not too high on the home water heater. Do not go swimming alone. When caring for infants or small children, sit down when holding, feeding, or changing them to minimize risk of injury to the child in the event you have a seizure. Also, Maintain good sleep hygiene. Avoid alcohol.   Driving Restrictions   Complete by: As directed    **Per [employer], patients with seizures are not allowed to drive until  they have been seizure-free for six months.   Increase activity slowly   Complete by: As directed    Other Restrictions   Complete by: As directed    Use caution when using heavy equipment or power tools.   Avoid working on ladders or at heights. Take showers instead of baths.   Ensure the water temperature is not too high on the home water heater.   Do not go swimming alone.   When caring for infants or small children, sit down when holding, feeding, or changing them to minimize risk of injury to the child in the event you have a seizure.   Also, Maintain good sleep hygiene. Avoid alcohol.     Allergies as of 03/29/2019      Reactions   Cheese Shortness Of Breath   Chocolate Shortness Of Breath   Ibuprofen Hives, Shortness Of Breath   Children's ibuprofen   Ivp Dye [iodinated Diagnostic Agents] Hives, Shortness Of Breath, Itching   Latex Anaphylaxis, Swelling, Other (See Comments)   Reaction:  Localized swelling    Orange Juice  [orange Oil] Shortness Of Breath   Other Hives, Other (See Comments)   Pt states that she is allergic to all steroids except IV solu-medrol.     Peach [prunus Persica] Anaphylaxis   Peanuts [peanut Oil] Anaphylaxis   Pear Anaphylaxis   Prednisone Hives   Raspberry Anaphylaxis   Tylenol [acetaminophen] Hives, Shortness Of Breath, Other (See Comments)   Pt states that this is only with the liquid form  Amoxicillin Hives, Other (See Comments)   Has patient had a PCN reaction causing immediate rash, facial/tongue/throat swelling, SOB or lightheadedness with hypotension: No Has patient had a PCN reaction causing severe rash involving mucus membranes or skin necrosis: No Has patient had a PCN reaction that required hospitalization: No Has patient had a PCN reaction occurring within the last 10 years: No If all of the above answers are "NO", then may proceed with Cephalosporin use.   Apricot Flavor Hives   Doxycycline Hives   Erythromycin Hives   Milk Of Magnesia [magnesium Hydroxide] Hives, Itching   Penicillins Hives, Other (See Comments)   Has patient had a PCN reaction causing immediate rash, facial/tongue/throat swelling, SOB or lightheadedness with hypotension: No Has patient had a PCN reaction causing severe rash involving mucus membranes or skin necrosis: No Has patient had a PCN reaction that required hospitalization: No Has patient had a PCN reaction occurring within the last 10 years: No If all of the above answers are "NO", then may proceed with Cephalosporin use.      Medication List    STOP taking these medications   dornase alpha 1 MG/ML nebulizer solution Commonly known as: PULMOZYME   Fluticasone-Salmeterol 500-50 MCG/DOSE Aepb Commonly known as: ADVAIR     TAKE these medications   albuterol 108 (90 Base) MCG/ACT inhaler Commonly known as: VENTOLIN HFA Inhale 2 puffs into the lungs every 4 (four) hours as needed for wheezing or shortness of breath.   cetirizine  10 MG tablet Commonly known as: ZYRTEC Take 1 tablet (10 mg total) by mouth daily.   docusate sodium 50 MG capsule Commonly known as: COLACE Take 1 capsule (50 mg total) by mouth 2 (two) times daily as needed for mild constipation.   feeding supplement (ENSURE ENLIVE) Liqd Take 237 mLs by mouth 2 (two) times daily between meals. 474 mL 3 times a day, provide 3-week supply   ferrous sulfate 325 (65 FE) MG tablet Take 1 tablet (325 mg total) by mouth daily.   levETIRAcetam 1000 MG tablet Commonly known as: Keppra Take 1 tablet (1,000 mg total) by mouth 2 (two) times daily. What changed:   medication strength  how much to take   mometasone-formoterol 200-5 MCG/ACT Aero Commonly known as: DULERA Inhale 2 puffs into the lungs 2 (two) times daily.   ondansetron 8 MG disintegrating tablet Commonly known as: Zofran ODT Take 1 tablet (8 mg total) by mouth every 8 (eight) hours as needed for nausea or vomiting.   senna-docusate 8.6-50 MG tablet Commonly known as: Senokot-S Take 2 tablets by mouth at bedtime. For AFTER surgery, do not take if having diarrhea   SUMAtriptan 50 MG tablet Commonly known as: IMITREX Take 1 tablet (50 mg total) by mouth daily. May take one more tablet two hours after the first. No more than two tablets per day.   topiramate 50 MG tablet Commonly known as: TOPAMAX Take 1 tablet (50 mg total) by mouth at bedtime.   Vitamin C 100 MG Chew Chew 1 tablet (100 mg total) by mouth daily.      Follow-up Information    Clent Demark, PA-C. Call.   Specialty: Physician Assistant Why: Follow up within 1 week Contact information: [address] [place] [address] [phone]        [facility]. Call.   Specialty: Neurology Why: Follow up within 1-2 weeks for Hospital Follow up for Seizures Contact information: [place]  [phone]         Allergies  Allergen  Reactions  . Cheese Shortness Of Breath  . Chocolate Shortness Of Breath  . Ibuprofen Hives and Shortness Of Breath    Children's ibuprofen  . Ivp Dye [Iodinated Diagnostic Agents] Hives, Shortness Of Breath and Itching  . Latex Anaphylaxis, Swelling and Other (See Comments)    Reaction:  Localized swelling   . Orange Juice [Orange Oil] Shortness Of Breath  . Other Hives and Other (See Comments)    Pt states that she is allergic to all steroids except IV solu-medrol.    Marland Kitchen Peach [Prunus Persica] Anaphylaxis  . Peanuts [Peanut Oil] Anaphylaxis  . Pear Anaphylaxis  . Prednisone Hives  . Raspberry Anaphylaxis  . Tylenol [Acetaminophen] Hives, Shortness Of Breath and Other (See Comments)    Pt states that this is only with the liquid form  . Amoxicillin Hives and Other (See Comments)    Has patient had a PCN reaction causing immediate rash, facial/tongue/throat swelling, SOB or lightheadedness with hypotension: No Has patient had a PCN reaction causing severe rash involving mucus membranes or skin necrosis: No Has patient had a PCN reaction that required hospitalization: No Has patient had a PCN reaction occurring within the last 10 years: No If all of the above answers are "NO", then may proceed with Cephalosporin use.  Marland Kitchen Apricot Flavor Hives  . Doxycycline Hives  . Erythromycin Hives  . Milk Of Magnesia [Magnesium Hydroxide] Hives and Itching  . Penicillins Hives and Other (See Comments)    Has patient had a PCN reaction causing immediate rash, facial/tongue/throat swelling, SOB or lightheadedness with hypotension: No Has patient had a PCN reaction causing severe rash involving mucus membranes or skin necrosis: No Has patient had a PCN reaction that required hospitalization: No Has patient had a PCN reaction occurring within the last 10 years: No If all of the above answers are "NO", then may proceed with Cephalosporin use.    Consultations:  Neurology  Procedures/Studies: EEG  Result Date: 03/28/2019 Alexis Goodell, MD     03/28/2019  6:59 PM ELECTROENCEPHALOGRAM REPORT Patient: Charlia Gennett       Room #: 4NP07C EEG No. ID: [id number] Age: 29 y.o.        Sex: female Requesting Physician: Alfredia Ferguson Report Date:  03/28/2019       Interpreting Physician: Alexis Goodell History: Eyvette Piper is an 29 y.o. female with a history of seizures presenting with breakthrough  seizures Medications: Keppra Conditions of Recording:  This is a 21 channel routine scalp EEG performed with bipolar and monopolar montages arranged in accordance to the international 10/20 system of electrode placement. One channel was dedicated to EKG recordings. The patient is in the awake and drowsy states. Description:  There are frequent periods of beta artifact that obscure the background at times.  The background activity is dominated by alpha activity.  Some intermixed teta and delta activity are noted occasionally as well.  The patient drowses with slowing to irregular, low voltage theta and delta activity.  Stage II sleep is not obtained. No epileptiform activity is noted.  Hyperventilation was not performed.  Intermittent photic stimulation was performed but failed to illicit any change in the tracing. IMPRESSION: This electroencephalogram is consistent with medication effect.  No epileptiform activity is noted.   Alexis Goodell, MD Neurology [phone] 03/28/2019, 6:44 PM   CT HEAD WO CONTRAST  Result Date: 03/28/2019 CLINICAL DATA:  Seizure with abnormal neuro exam EXAM: CT HEAD  WITHOUT CONTRAST TECHNIQUE: Contiguous axial images were obtained from the base of the skull through the vertex without intravenous contrast. COMPARISON:  Nine days ago FINDINGS: Brain: No evidence of acute infarction, hemorrhage, hydrocephalus, extra-axial collection or mass lesion/mass effect. No cortical finding to explain seizure. Partially empty sella, nonspecific in  isolation and with this history. Vascular: No hyperdense vessel or unexpected calcification. Skull: Normal. Negative for fracture or focal lesion. Sinuses/Orbits: Presumed retention cyst in left frontal sinus. No acute sinusitis. IMPRESSION: No acute finding or change from prior. No cortical finding to explain seizure. Electronically Signed   By: Monte Fantasia M.D.   On: 03/28/2019 06:11   CT Head Wo Contrast  Result Date: 03/19/2019 CLINICAL DATA:  Seizure, abnormal neuro exam. Neck trauma, uncomplicated. Additional history provided: Patient with reported tonic clonic seizure in waiting room fell and struck head EXAM: CT HEAD WITHOUT CONTRAST CT CERVICAL SPINE WITHOUT CONTRAST TECHNIQUE: Multidetector CT imaging of the head and cervical spine was performed following the standard protocol without intravenous contrast. Multiplanar CT image reconstructions of the cervical spine were also generated. COMPARISON:  Head CT 06/13/2017, radiographs of the cervical spine 04/19/2016 FINDINGS: CT HEAD FINDINGS Brain: There is no evidence of acute intracranial hemorrhage, intracranial mass, midline shift or extra-axial fluid collection.No demarcated cortical infarction. Cerebral volume is normal for age. Vascular: No hyperdense vessel. Skull: Normal. Negative for fracture or focal lesion. Sinuses/Orbits: Visualized orbits demonstrate no acute abnormality. Redemonstrated left frontal sinus mucous retention cyst. No significant mastoid effusion. Other: Right forehead/periorbital periorbital hematoma. CT CERVICAL SPINE FINDINGS Alignment: Straightening of the expected cervical lordosis. No significant spondylolisthesis. Skull base and vertebrae: The basion-dental and atlanto-dental intervals are maintained.No evidence of acute fracture to the cervical spine. Soft tissues and spinal canal: No prevertebral fluid or swelling. No visible canal hematoma. Disc levels: No significant bony spinal canal or neural foraminal narrowing  at any level. Upper chest: No consolidation within the imaged lung apices. Biapical paraseptal emphysema with biapical blebs. IMPRESSION: CT head: 1. Unremarkable noncontrast CT appearance of the brain. No evidence of acute intracranial abnormality. 2. Right forehead/periorbital hematoma. 3. Redemonstrated left frontal sinus mucous retention cyst. CT cervical spine: 1. No evidence of acute fracture to the cervical spine. 2. The imaged lungs demonstrate biapical paraseptal emphysema with biapical blebs. Electronically Signed   By: Kellie Simmering DO   On: 03/19/2019 18:09   CT Cervical Spine Wo Contrast  Result Date: 03/19/2019 CLINICAL DATA:  Seizure, abnormal neuro exam. Neck trauma, uncomplicated. Additional history provided: Patient with reported tonic clonic seizure in waiting room fell and struck head EXAM: CT HEAD WITHOUT CONTRAST CT CERVICAL SPINE WITHOUT CONTRAST TECHNIQUE: Multidetector CT imaging of the head and cervical spine was performed following the standard protocol without intravenous contrast. Multiplanar CT image reconstructions of the cervical spine were also generated. COMPARISON:  Head CT 06/13/2017, radiographs of the cervical spine 04/19/2016 FINDINGS: CT HEAD FINDINGS Brain: There is no evidence of acute intracranial hemorrhage, intracranial mass, midline shift or extra-axial fluid collection.No demarcated cortical infarction. Cerebral volume is normal for age. Vascular: No hyperdense vessel. Skull: Normal. Negative for fracture or focal lesion. Sinuses/Orbits: Visualized orbits demonstrate no acute abnormality. Redemonstrated left frontal sinus mucous retention cyst. No significant mastoid effusion. Other: Right forehead/periorbital periorbital hematoma. CT CERVICAL SPINE FINDINGS Alignment: Straightening of the expected cervical lordosis. No significant spondylolisthesis. Skull base and vertebrae: The basion-dental and atlanto-dental intervals are maintained.No evidence of acute fracture  to the cervical spine. Soft tissues and spinal canal: No  prevertebral fluid or swelling. No visible canal hematoma. Disc levels: No significant bony spinal canal or neural foraminal narrowing at any level. Upper chest: No consolidation within the imaged lung apices. Biapical paraseptal emphysema with biapical blebs. IMPRESSION: CT head: 1. Unremarkable noncontrast CT appearance of the brain. No evidence of acute intracranial abnormality. 2. Right forehead/periorbital hematoma. 3. Redemonstrated left frontal sinus mucous retention cyst. CT cervical spine: 1. No evidence of acute fracture to the cervical spine. 2. The imaged lungs demonstrate biapical paraseptal emphysema with biapical blebs. Electronically Signed   By: Kellie Simmering DO   On: 03/19/2019 18:09   MR BRAIN W WO CONTRAST  Result Date: 03/28/2019 CLINICAL DATA:  Seizure, abnormal neuro exam. EXAM: MRI HEAD WITHOUT AND WITH CONTRAST TECHNIQUE: Multiplanar, multiecho pulse sequences of the brain and surrounding structures were obtained without and with intravenous contrast. CONTRAST:  6.33mL GADAVIST GADOBUTROL 1 MMOL/ML IV SOLN COMPARISON:  CT head without contrast 03/28/2019, 03/19/2019, and 07/26/2017. FINDINGS: Brain: No acute infarct, hemorrhage, or mass lesion is present. No significant white matter lesions are present. The ventricles are of normal size. Dedicated imaging of the temporal lobes demonstrates symmetric size and signal of the hippocampal structures bilaterally. No mass lesion is present. Diffusion-weighted images are normal. Postcontrast images demonstrate no pathologic enhancement. Vascular: Flow is present in the major intracranial arteries. Skull and upper cervical spine: The craniocervical junction is normal. Upper cervical spine is within normal limits. Marrow signal is unremarkable. Sinuses/Orbits: The left frontal sinus is opacified. The paranasal sinuses and mastoid air cells are otherwise clear. The globes and orbits are within  normal limits. IMPRESSION: 1. Normal MRI appearance of the brain. No acute or focal lesion to explain seizures. 2. Left frontal sinus opacification. Electronically Signed   By: San Morelle M.D.   On: 03/28/2019 18:56   DG Chest Port 1 View  Result Date: 03/19/2019 CLINICAL DATA:  Weakness.  Seizure EXAM: PORTABLE CHEST 1 VIEW COMPARISON:  10/03/2018 FINDINGS: The heart size and mediastinal contours are within normal limits. Both lungs are clear. The visualized skeletal structures are unremarkable. IMPRESSION: No active disease. Electronically Signed   By: Kerby Moors M.D.   On: 03/19/2019 19:25    Subjective: Seen and examined at bedside and feels well.  Denies any nausea or vomiting.  No other seizure activity but does complain of some tongue numbness as she thinks she bit it.  No other concerns or complaints this time and wants to go home.  Discharge Exam: Vitals:   03/29/19 0751 03/29/19 1139  BP: 102/61 106/67  Pulse: 60 (!) 59  Resp: 20 17  Temp: 98.2 F (36.8 C) 98.2 F (36.8 C)  SpO2: 99% 100%   Vitals:   03/28/19 2200 03/28/19 2341 03/29/19 0751 03/29/19 1139  BP:  (!) 96/56 102/61 106/67  Pulse: 73 71 60 (!) 59  Resp: (!) 21 16 20 17   Temp:  98.2 F (36.8 C) 98.2 F (36.8 C) 98.2 F (36.8 C)  TempSrc:  Oral  Oral  SpO2: (!) 79% 100% 99% 100%   General: Pt is alert, awake, not in acute distress but had the covers pulled over her head due to being sensitive to light and stating is too bright Cardiovascular: RRR, S1/S2 +, no rubs, no gallops Respiratory: Slightly diminished bilaterally, no wheezing, no rhonchi Abdominal: Soft, NT, ND, bowel sounds + Extremities: no edema, no cyanosis  The results of significant diagnostics from this hospitalization (including imaging, microbiology, ancillary and laboratory) are listed below  for reference.    Microbiology: Recent Results (from the past 240 hour(s))  Respiratory Panel by RT PCR (Flu A&B, Covid) -  Nasopharyngeal Swab     Status: None   Collection Time: 03/19/19  8:42 PM   Specimen: Nasopharyngeal Swab  Result Value Ref Range Status   SARS Coronavirus 2 by RT PCR NEGATIVE NEGATIVE Final    Comment: (NOTE) SARS-CoV-2 target nucleic acids are NOT DETECTED. The SARS-CoV-2 RNA is generally detectable in upper respiratoy specimens during the acute phase of infection. The lowest concentration of SARS-CoV-2 viral copies this assay can detect is 131 copies/mL. A negative result does not preclude SARS-Cov-2 infection and should not be used as the sole basis for treatment or other patient management decisions. A negative result may occur with  improper specimen collection/handling, submission of specimen other than nasopharyngeal swab, presence of viral mutation(s) within the areas targeted by this assay, and inadequate number of viral copies (<131 copies/mL). A negative result must be combined with clinical observations, patient history, and epidemiological information. The expected result is Negative. Fact Sheet for Patients:  PinkCheek.be Fact Sheet for Healthcare Providers:  GravelBags.it This test is not yet ap proved or cleared by the [place] FDA and  has been authorized for detection and/or diagnosis of SARS-CoV-2 by FDA under an Emergency Use Authorization (EUA). This EUA will remain  in effect (meaning this test can be used) for the duration of the COVID-19 declaration under Section 564(b)(1) of the Act, 21 U.S.C. section 360bbb-3(b)(1), unless the authorization is terminated or revoked sooner.    Influenza A by PCR NEGATIVE NEGATIVE Final   Influenza B by PCR NEGATIVE NEGATIVE Final    Comment: (NOTE) The Xpert Xpress SARS-CoV-2/FLU/RSV assay is intended as an aid in  the diagnosis of influenza from Nasopharyngeal swab specimens and  should not be used as a sole basis for treatment. Nasal washings and  aspirates  are unacceptable for Xpert Xpress SARS-CoV-2/FLU/RSV  testing. Fact Sheet for Patients: PinkCheek.be Fact Sheet for Healthcare Providers: GravelBags.it This test is not yet approved or cleared by the [place] FDA and  has been authorized for detection and/or diagnosis of SARS-CoV-2 by  FDA under an Emergency Use Authorization (EUA). This EUA will remain  in effect (meaning this test can be used) for the duration of the  Covid-19 declaration under Section 564(b)(1) of the Act, 21  U.S.C. section 360bbb-3(b)(1), unless the authorization is  terminated or revoked. Performed at [facility], [place] [address], [place], [place] [address]   MRSA PCR Screening     Status: None   Collection Time: 03/19/19 10:33 PM   Specimen: Nasal Mucosa; Nasopharyngeal  Result Value Ref Range Status   MRSA by PCR NEGATIVE NEGATIVE Final    Comment:        The GeneXpert MRSA Assay (FDA approved for NASAL specimens only), is one component of a comprehensive MRSA colonization surveillance program. It is not intended to diagnose MRSA infection nor to guide or monitor treatment for MRSA infections. Performed at [facility], [place] [address], [place], [place] [address]   SARS CORONAVIRUS 2 (TAT 6-24 HRS) Nasopharyngeal Nasopharyngeal Swab     Status: None   Collection Time: 03/28/19 12:59 AM   Specimen: Nasopharyngeal Swab  Result Value Ref Range Status   SARS Coronavirus 2 NEGATIVE NEGATIVE Final    Comment: (NOTE) SARS-CoV-2 target nucleic acids are NOT DETECTED. The SARS-CoV-2 RNA is generally detectable in upper and lower respiratory specimens  during the acute phase of infection. Negative results do not preclude SARS-CoV-2 infection, do not rule out co-infections with other pathogens, and should not be used as the sole basis for treatment or other patient management decisions. Negative results must be  combined with clinical observations, patient history, and epidemiological information. The expected result is Negative. Fact Sheet for Patients: SugarRoll.be Fact Sheet for Healthcare Providers: https://www.woods-mathews.com/ This test is not yet approved or cleared by the [place] FDA and  has been authorized for detection and/or diagnosis of SARS-CoV-2 by FDA under an Emergency Use Authorization (EUA). This EUA will remain  in effect (meaning this test can be used) for the duration of the COVID-19 declaration under Section 56 4(b)(1) of the Act, 21 U.S.C. section 360bbb-3(b)(1), unless the authorization is terminated or revoked sooner. Performed at [facility], [place] [address], [place], [place] [address]   MRSA PCR Screening     Status: None   Collection Time: 03/28/19  5:04 AM   Specimen: Nasal Mucosa; Nasopharyngeal  Result Value Ref Range Status   MRSA by PCR NEGATIVE NEGATIVE Final    Comment:        The GeneXpert MRSA Assay (FDA approved for NASAL specimens only), is one component of a comprehensive MRSA colonization surveillance program. It is not intended to diagnose MRSA infection nor to guide or monitor treatment for MRSA infections. Performed at [facility], [place] [address] [place], [place] [address]     Labs: BNP (last 3 results) No results for input(s): BNP in the last 8760 hours. Basic Metabolic Panel: Recent Labs  Lab 03/25/19 1330 03/27/19 2230 03/28/19 0619 03/29/19 0525  NA 137 137 138 137  K 3.4* 2.9* 3.7 3.3*  CL 104 101 108 108  CO2 14* 26 21* 22  GLUCOSE 117* 95 88 87  BUN 7 7 5* <5*  CREATININE 0.93 0.63 0.53 0.52  CALCIUM 8.9 9.0 8.3* 8.7*  MG  --   --   --  2.0  PHOS  --   --   --  3.3   Liver Function Tests: Recent Labs  Lab 03/25/19 1330 03/27/19 2230 03/28/19 0619 03/29/19 0525  AST 29 26 24 27   ALT 12 13 10 23   ALKPHOS 49 51 44 45  BILITOT 0.3 0.6 0.2* 0.6  PROT  6.9 7.4 5.9* 6.0*  ALBUMIN 4.2 4.4 3.4* 3.2*   No results for input(s): LIPASE, AMYLASE in the last 168 hours. No results for input(s): AMMONIA in the last 168 hours. CBC: Recent Labs  Lab 03/25/19 1330 03/27/19 2230 03/28/19 0807 03/29/19 0525  WBC 18.3* 13.1* 10.8* 7.7  NEUTROABS 12.0* 7.9* 7.6 4.6  HGB 10.5* 9.7* 9.6* 9.3*  HCT 35.7* 31.7* 31.5* 30.6*  MCV 77.8* 75.7* 74.8* 75.0*  PLT 372 312 279 292   Cardiac Enzymes: No results for input(s): CKTOTAL, CKMB, CKMBINDEX, TROPONINI in the last 168 hours. BNP: Invalid input(s): POCBNP CBG: Recent Labs  Lab 03/28/19 1845 03/28/19 2343 03/29/19 0644 03/29/19 0750 03/29/19 1145  GLUCAP 91 100* 88 93 99   D-Dimer No results for input(s): DDIMER in the last 72 hours. Hgb A1c No results for input(s): HGBA1C in the last 72 hours. Lipid Profile No results for input(s): CHOL, HDL, LDLCALC, TRIG, CHOLHDL, LDLDIRECT in the last 72 hours. Thyroid function studies No results for input(s): TSH, T4TOTAL, T3FREE, THYROIDAB in the last 72 hours.  Invalid input(s): FREET3 Anemia work up [employer]    03/29/19 [place]  325  FOLATE 7.8  FERRITIN 11  TIBC 504*  IRON 23*  RETICCTPCT 1.5   Urinalysis    Component Value Date/Time   COLORURINE YELLOW 03/25/2019 1622   APPEARANCEUR HAZY (A) 03/25/2019 1622   LABSPEC 1.016 03/25/2019 1622   PHURINE 5.0 03/25/2019 1622   GLUCOSEU NEGATIVE 03/25/2019 1622   HGBUR NEGATIVE 03/25/2019 1622   BILIRUBINUR NEGATIVE 03/25/2019 1622   KETONESUR 5 (A) 03/25/2019 1622   PROTEINUR NEGATIVE 03/25/2019 1622   UROBILINOGEN 1.0 11/04/2014 0946   NITRITE NEGATIVE 03/25/2019 1622   LEUKOCYTESUR NEGATIVE 03/25/2019 1622   Sepsis Labs Invalid input(s): PROCALCITONIN,  WBC,  LACTICIDVEN Microbiology Recent Results (from the past 240 hour(s))  Respiratory Panel by RT PCR (Flu A&B, Covid) - Nasopharyngeal Swab     Status: None   Collection Time: 03/19/19  8:42 PM   Specimen:  Nasopharyngeal Swab  Result Value Ref Range Status   SARS Coronavirus 2 by RT PCR NEGATIVE NEGATIVE Final    Comment: (NOTE) SARS-CoV-2 target nucleic acids are NOT DETECTED. The SARS-CoV-2 RNA is generally detectable in upper respiratoy specimens during the acute phase of infection. The lowest concentration of SARS-CoV-2 viral copies this assay can detect is 131 copies/mL. A negative result does not preclude SARS-Cov-2 infection and should not be used as the sole basis for treatment or other patient management decisions. A negative result may occur with  improper specimen collection/handling, submission of specimen other than nasopharyngeal swab, presence of viral mutation(s) within the areas targeted by this assay, and inadequate number of viral copies (<131 copies/mL). A negative result must be combined with clinical observations, patient history, and epidemiological information. The expected result is Negative. Fact Sheet for Patients:  PinkCheek.be Fact Sheet for Healthcare Providers:  GravelBags.it This test is not yet ap proved or cleared by the [place] FDA and  has been authorized for detection and/or diagnosis of SARS-CoV-2 by FDA under an Emergency Use Authorization (EUA). This EUA will remain  in effect (meaning this test can be used) for the duration of the COVID-19 declaration under Section 564(b)(1) of the Act, 21 U.S.C. section 360bbb-3(b)(1), unless the authorization is terminated or revoked sooner.    Influenza A by PCR NEGATIVE NEGATIVE Final   Influenza B by PCR NEGATIVE NEGATIVE Final    Comment: (NOTE) The Xpert Xpress SARS-CoV-2/FLU/RSV assay is intended as an aid in  the diagnosis of influenza from Nasopharyngeal swab specimens and  should not be used as a sole basis for treatment. Nasal washings and  aspirates are unacceptable for Xpert Xpress SARS-CoV-2/FLU/RSV  testing. Fact Sheet for  Patients: PinkCheek.be Fact Sheet for Healthcare Providers: GravelBags.it This test is not yet approved or cleared by the [place] FDA and  has been authorized for detection and/or diagnosis of SARS-CoV-2 by  FDA under an Emergency Use Authorization (EUA). This EUA will remain  in effect (meaning this test can be used) for the duration of the  Covid-19 declaration under Section 564(b)(1) of the Act, 21  U.S.C. section 360bbb-3(b)(1), unless the authorization is  terminated or revoked. Performed at [facility], [place] [address], [place], [place] [address]   MRSA PCR Screening     Status: None   Collection Time: 03/19/19 10:33 PM   Specimen: Nasal Mucosa; Nasopharyngeal  Result Value Ref Range Status   MRSA by PCR NEGATIVE NEGATIVE Final    Comment:        The GeneXpert MRSA Assay (FDA approved for NASAL specimens only), is one  component of a comprehensive MRSA colonization surveillance program. It is not intended to diagnose MRSA infection nor to guide or monitor treatment for MRSA infections. Performed at [facility], [place] [address] [place], [place] [address]   SARS CORONAVIRUS 2 (TAT 6-24 HRS) Nasopharyngeal Nasopharyngeal Swab     Status: None   Collection Time: 03/28/19 12:59 AM   Specimen: Nasopharyngeal Swab  Result Value Ref Range Status   SARS Coronavirus 2 NEGATIVE NEGATIVE Final    Comment: (NOTE) SARS-CoV-2 target nucleic acids are NOT DETECTED. The SARS-CoV-2 RNA is generally detectable in upper and lower respiratory specimens during the acute phase of infection. Negative results do not preclude SARS-CoV-2 infection, do not rule out co-infections with other pathogens, and should not be used as the sole basis for treatment or other patient management decisions. Negative results must be combined with clinical observations, patient history, and epidemiological  information. The expected result is Negative. Fact Sheet for Patients: SugarRoll.be Fact Sheet for Healthcare Providers: https://www.woods-mathews.com/ This test is not yet approved or cleared by the [place] FDA and  has been authorized for detection and/or diagnosis of SARS-CoV-2 by FDA under an Emergency Use Authorization (EUA). This EUA will remain  in effect (meaning this test can be used) for the duration of the COVID-19 declaration under Section 56 4(b)(1) of the Act, 21 U.S.C. section 360bbb-3(b)(1), unless the authorization is terminated or revoked sooner. Performed at [facility], [place] [address] [place], [place] [address]   MRSA PCR Screening     Status: None   Collection Time: 03/28/19  5:04 AM   Specimen: Nasal Mucosa; Nasopharyngeal  Result Value Ref Range Status   MRSA by PCR NEGATIVE NEGATIVE Final    Comment:        The GeneXpert MRSA Assay (FDA approved for NASAL specimens only), is one component of a comprehensive MRSA colonization surveillance program. It is not intended to diagnose MRSA infection nor to guide or monitor treatment for MRSA infections. Performed at [facility], [place] [address], [place], [place] [address]    Time coordinating discharge: 35 minutes  SIGNED:  Kerney Elbe, DO [facility] 03/29/2019, 12:47 PM Pager is on [place]  If 7PM-7AM, please contact night-coverage [email or web address] Password [email or web address]

## 2019-03-29 NOTE — Progress Notes (Addendum)
NEURO HOSPITALIST PROGRESS NOTE   Subjective: Patient awake, alert, NAD. States she does take her Keppra every day. Endorses missing 1 dose.  Has not seen a Neurologist in 5 years since moving here from [place]. 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 She states that she was a patient at [facility] in [place]. Records are not available on Epic.  Exam: Vitals:   03/28/19 2341 03/29/19 0751  BP: (!) 96/56 102/61  Pulse: 71 60  Resp: 16 20  Temp: 98.2 F (36.8 C) 98.2 F (36.8 C)  SpO2: 100% 99%    Physical Exam  Constitutional: Appears well-developed and well-nourished.  Psych: Affect appropriate to situation Eyes: Normal external eye and conjunctiva. Some missing dentition HENT: Normocephalic, no lesions, without obvious abnormality.   Musculoskeletal-no joint tenderness, deformity or swelling Cardiovascular: Normal rate and regular rhythm.  Respiratory: Effort normal, non-labored breathing saturations WNL GI: Soft.  No distension. There is no tenderness.  Skin: WDI  Neuro:  Mental Status: Alert, oriented, thought content appropriate.  Speech fluent without evidence of aphasia.  Able to follow commands without difficulty. Cranial Nerves: II:  Visual fields grossly normal, PERRL III,IV, VI: ptosis not present, EOMI V,VII: smile symmetric, facial light touch sensation normal bilaterally VIII: hearing normal bilaterally IX,X: uvula rises symmetrically XI: bilateral shoulder shrug XII: midline tongue extension Motor: Right : Upper extremity   5/5  Left:     Upper extremity   5/5  Lower extremity   5/5   Lower extremity   5/5 Normal tone throughout; no atrophy noted Sensory: Light touch intact throughout, bilaterally Cerebellar: No ataxia noted Gait: Deferred    Medications:  Scheduled: . potassium chloride  40 mEq Oral BID   Continuous: . levETIRAcetam 1,000 mg (03/29/19 0945)   [id number]  **OR** acetaminophen, LORazepam, methocarbamol, ondansetron **OR** ondansetron (ZOFRAN) IV  Pertinent Labs/Diagnostics:   EEG  Result Date: 03/28/2019 Jasmine Goodell, MD     03/28/2019  6:59 PM ELECTROENCEPHALOGRAM REPORT Patient: Jasmine [name]       Room #: 4NP07C EEG No. ID: [id number] Age: 29 y.o.        Sex: female Requesting Physician: Jasmine [name] Report Date:  03/28/2019       Interpreting Physician: Jasmine [name] History: Jasmine [name] is an 29 y.o. female with a history of seizures presenting with breakthrough  seizures Medications: Keppra Conditions of Recording:  This is a 21 channel routine scalp EEG performed with bipolar and monopolar montages arranged in accordance to the international 10/20 system of electrode placement. One channel was dedicated to EKG recordings. The patient is in the awake and drowsy states. Description:  There are frequent periods of beta artifact that obscure the background at times.  The background activity is dominated by alpha activity.  Some intermixed teta and delta activity are noted occasionally as well.  The patient drowses with slowing to irregular, low voltage theta and delta activity.  Stage II sleep is not obtained. No epileptiform activity is noted.  Hyperventilation was not performed.  Intermittent photic stimulation was performed but failed to illicit any change in the tracing. IMPRESSION: This electroencephalogram is consistent with medication effect.  No epileptiform activity is noted.   Jasmine Goodell, MD Neurology [phone] 03/28/2019, 6:44 PM   CT HEAD WO CONTRAST  Result Date: 03/28/2019 CLINICAL DATA:  Seizure with abnormal neuro  exam EXAM: CT HEAD WITHOUT CONTRAST TECHNIQUE: Contiguous axial images were obtained from the base of the skull through the vertex without intravenous contrast. COMPARISON:  Nine days ago FINDINGS: Brain: No evidence of acute infarction, hemorrhage, hydrocephalus, extra-axial collection or mass lesion/mass effect. No  cortical finding to explain seizure. Partially empty sella, nonspecific in isolation and with this history. Vascular: No hyperdense vessel or unexpected calcification. Skull: Normal. Negative for fracture or focal lesion. Sinuses/Orbits: Presumed retention cyst in left frontal sinus. No acute sinusitis. IMPRESSION: No acute finding or change from prior. No cortical finding to explain seizure. Electronically Signed   By: Monte Fantasia M.D.   On: 03/28/2019 06:11   MR BRAIN W WO CONTRAST  Result Date: 03/28/2019 CLINICAL DATA:  Seizure, abnormal neuro exam. EXAM: MRI HEAD WITHOUT AND WITH CONTRAST TECHNIQUE: Multiplanar, multiecho pulse sequences of the brain and surrounding structures were obtained without and with intravenous contrast. CONTRAST:  6.40mL GADAVIST GADOBUTROL 1 MMOL/ML IV SOLN COMPARISON:  CT head without contrast 03/28/2019, 03/19/2019, and 07/26/2017. FINDINGS: Brain: No acute infarct, hemorrhage, or mass lesion is present. No significant white matter lesions are present. The ventricles are of normal size. Dedicated imaging of the temporal lobes demonstrates symmetric size and signal of the hippocampal structures bilaterally. No mass lesion is present. Diffusion-weighted images are normal. Postcontrast images demonstrate no pathologic enhancement. Vascular: Flow is present in the major intracranial arteries. Skull and upper cervical spine: The craniocervical junction is normal. Upper cervical spine is within normal limits. Marrow signal is unremarkable. Sinuses/Orbits: The left frontal sinus is opacified. The paranasal sinuses and mastoid air cells are otherwise clear. The globes and orbits are within normal limits. IMPRESSION: 1. Normal MRI appearance of the brain. No acute or focal lesion to explain seizures. 2. Left frontal sinus opacification. Electronically Signed   By: San Morelle M.D.   On: 03/28/2019 18:56   Assessment:  29 year old with a history of seizure disorder, unclear  what her neurological follow-up is, on Keppra 750 twice daily at home, presenting with breakthrough seizures now for third or fourth time in the past few weeks.  Apparently clinically presented in status epilepticus at an outside hospital, which resolved with treatment. States that she missed one dose of Keppra prior to this admission.  1. On initial examination patient was very somnolent after receiving multiple doses of Keppra (including Keppra load of 1500 mg), but did not appear to be seizing clinically and had a non-focal exam. Scheduled Keppra has been increased from her home dose of 750 BID to 1000 mg BID. 2. On exam today, the patient is awake, alert and oriented. Non focal exam. Able to follow commands and answer questions. No nystagmus or jerking movements noted.  3. EEG 03/28/19: IMPRESSION: This electroencephalogram is consistent with medication effect.  No epileptiform activity is noted.   4. MRI brain w/wo contrast was normal. No acute or focal lesions to explain seizures.  5. Suspect breakthrough seizures due to noncompliance with medication versus THC overuse.  Impression: -Status epilepticus-resolved -Breakthrough seizures-etiology unclear.   Recommendations:. --Continue Keppra at 1000 mg BID --Maintain seizure precautions --Try to gather more history from family as well as any old neurological records including clinic notes, imaging and EEG results  --Outpatient neurology f/u --Per [facility] statutes, patients with seizures are not allowed to drive until  they have been seizure-free for six months. Use caution when using heavy equipment or power tools. Avoid working on ladders or at heights. Take showers instead of  baths. Ensure the water temperature is not too high on the home water heater. Do not go swimming alone. When caring for infants or small children, sit down when holding, feeding, or changing them to minimize risk of injury to the child in the event you have a  seizure. Also, Maintain good sleep hygiene. Avoid alcohol. --Neurology to sign off at this time. Please call with any further questions.  Laurey Morale, MSN, NP-C [facility] [phone]  Electronically signed: Dr. Kerney Elbe 03/29/2019, 9:40 AM

## 2019-03-29 NOTE — Progress Notes (Signed)
Pt with discharge orders. Discharge paperwork reviewed with pt and all questions answered. IV removed. Pt escorted out via wheelchair with all belongings to vehicle.

## 2019-03-30 ENCOUNTER — Telehealth: Payer: Self-pay

## 2019-03-30 NOTE — Telephone Encounter (Signed)
Transition Care Management Follow-up Telephone Call Date of discharge and from where: 03/29/2019. [facility]   Call placed to # [phone], message left with call back requested to this CM/  Call placed to # [phone], call rejected.   Patient needs follow up appointment scheduled at [facility]

## 2019-03-31 ENCOUNTER — Telehealth: Payer: Self-pay

## 2019-03-31 NOTE — Telephone Encounter (Signed)
Transition Care Management Follow-up Telephone Call  Date of discharge and from where: 03/29/2019, [facility].    How have you been since you were released from the hospital? She said she was resting.    Any questions or concerns?  none reported at this time   Items Reviewed:  Did the pt receive and understand the discharge instructions provided? yes she has the instructions and did not have any questions.   Medications obtained and verified?  she said that she has all of her medications and the med list.  She did not have any questions and said that she is aware of the change with keppra and to stop taking pulmozyme and advair. She correctly stated the instructions for keppra.    Any new allergies since your discharge?   None reported   Do you have support at home? she said she was alone but did not acknowledge if anyone is available to provide assistance if needed  Other (ie: DME, Home Health, etc) no home health ordered, no DME ordered  Functional Questionnaire: (I = Independent and D = Dependent) ADL's: independent   Follow up appointments reviewed:    PCP Hospital f/u appt confirmed?scheduled appointment with Jasmine Oletta Lamas, NP 04/06/2019 @ [facility] f/u appt confirmed? she said that she is waiting to hear from Neurology for an appointment. She was told that they would be calling her,   Are transportation arrangements needed?  no, she said she has transportation   If their condition worsens, is the pt aware to call  their PCP or go to the ED?   yes  Was the patient provided with contact information for the PCP's office or ED? Yes , she has the phone number for the clinic  Was the pt encouraged to call back with questions or concerns?  yes

## 2019-04-06 ENCOUNTER — Ambulatory Visit (INDEPENDENT_AMBULATORY_CARE_PROVIDER_SITE_OTHER): Payer: Medicaid Other | Admitting: Family Medicine

## 2019-04-16 DIAGNOSIS — J45909 Unspecified asthma, uncomplicated: Secondary | ICD-10-CM | POA: Diagnosis not present

## 2019-04-16 DIAGNOSIS — G4739 Other sleep apnea: Secondary | ICD-10-CM | POA: Diagnosis not present

## 2019-04-16 DIAGNOSIS — J45901 Unspecified asthma with (acute) exacerbation: Secondary | ICD-10-CM | POA: Diagnosis not present

## 2019-04-16 DIAGNOSIS — R2689 Other abnormalities of gait and mobility: Secondary | ICD-10-CM | POA: Diagnosis not present

## 2019-05-13 ENCOUNTER — Encounter (HOSPITAL_COMMUNITY): Payer: Self-pay | Admitting: Emergency Medicine

## 2019-05-13 ENCOUNTER — Other Ambulatory Visit: Payer: Self-pay

## 2019-05-13 ENCOUNTER — Emergency Department (HOSPITAL_COMMUNITY)
Admission: EM | Admit: 2019-05-13 | Discharge: 2019-05-13 | Disposition: A | Discharge status: Home / Self Care | Payer: Medicaid Other | Attending: Emergency Medicine | Admitting: Emergency Medicine

## 2019-05-13 DIAGNOSIS — R569 Unspecified convulsions: Secondary | ICD-10-CM | POA: Diagnosis not present

## 2019-05-13 DIAGNOSIS — E876 Hypokalemia: Secondary | ICD-10-CM | POA: Insufficient documentation

## 2019-05-13 DIAGNOSIS — Z86718 Personal history of other venous thrombosis and embolism: Secondary | ICD-10-CM | POA: Insufficient documentation

## 2019-05-13 DIAGNOSIS — Z87891 Personal history of nicotine dependence: Secondary | ICD-10-CM | POA: Insufficient documentation

## 2019-05-13 DIAGNOSIS — Z79899 Other long term (current) drug therapy: Secondary | ICD-10-CM | POA: Insufficient documentation

## 2019-05-13 DIAGNOSIS — R000 Tachycardia, unspecified: Secondary | ICD-10-CM | POA: Diagnosis not present

## 2019-05-13 DIAGNOSIS — R0689 Other abnormalities of breathing: Secondary | ICD-10-CM | POA: Diagnosis not present

## 2019-05-13 DIAGNOSIS — R404 Transient alteration of awareness: Secondary | ICD-10-CM | POA: Diagnosis not present

## 2019-05-13 DIAGNOSIS — G40909 Epilepsy, unspecified, not intractable, without status epilepticus: Secondary | ICD-10-CM | POA: Diagnosis not present

## 2019-05-13 DIAGNOSIS — I213 ST elevation (STEMI) myocardial infarction of unspecified site: Secondary | ICD-10-CM | POA: Diagnosis not present

## 2019-05-13 LAB — URINALYSIS, ROUTINE W REFLEX MICROSCOPIC
Bacteria, UA: NONE SEEN
Bilirubin Urine: NEGATIVE
Glucose, UA: NEGATIVE mg/dL
Hgb urine dipstick: NEGATIVE
Ketones, ur: NEGATIVE mg/dL
Leukocytes,Ua: NEGATIVE
Nitrite: NEGATIVE
Protein, ur: 30 mg/dL — AB
Specific Gravity, Urine: 1.02 (ref 1.005–1.030)
pH: 5 (ref 5.0–8.0)

## 2019-05-13 LAB — COMPREHENSIVE METABOLIC PANEL
ALT: 9 U/L (ref 0–44)
AST: 19 U/L (ref 15–41)
Albumin: 3.8 g/dL (ref 3.5–5.0)
Alkaline Phosphatase: 51 U/L (ref 38–126)
Anion gap: 13 (ref 5–15)
BUN: 7 mg/dL (ref 6–20)
CO2: 19 mmol/L — ABNORMAL LOW (ref 22–32)
Calcium: 8.7 mg/dL — ABNORMAL LOW (ref 8.9–10.3)
Chloride: 105 mmol/L (ref 98–111)
Creatinine, Ser: 0.83 mg/dL (ref 0.44–1.00)
GFR calc Af Amer: >60 mL/min (ref >60)
GFR calc non Af Amer: >60 mL/min (ref >60)
Glucose, Bld: 95 mg/dL (ref 70–99)
Potassium: 3.3 mmol/L — ABNORMAL LOW (ref 3.5–5.1)
Sodium: 137 mmol/L (ref 135–145)
Total Bilirubin: 0.4 mg/dL (ref 0.3–1.2)
Total Protein: 6.9 g/dL (ref 6.5–8.1)

## 2019-05-13 LAB — CBC WITH DIFFERENTIAL/PLATELET
Abs Immature Granulocytes: 0.22 10*3/uL — ABNORMAL HIGH (ref 0.00–0.07)
Basophils Absolute: 0 10*3/uL (ref 0.0–0.1)
Basophils Relative: 0 %
Eosinophils Absolute: 0 10*3/uL (ref 0.0–0.5)
Eosinophils Relative: 0 %
HCT: 34.3 % — ABNORMAL LOW (ref 36.0–46.0)
Hemoglobin: 10.3 g/dL — ABNORMAL LOW (ref 12.0–15.0)
Immature Granulocytes: 1 %
Lymphocytes Relative: 9 %
Lymphs Abs: 1.5 10*3/uL (ref 0.7–4.0)
MCH: 23.4 pg — ABNORMAL LOW (ref 26.0–34.0)
MCHC: 30 g/dL (ref 30.0–36.0)
MCV: 78 fL — ABNORMAL LOW (ref 80.0–100.0)
Monocytes Absolute: 0.8 10*3/uL (ref 0.1–1.0)
Monocytes Relative: 5 %
Neutro Abs: 13.9 10*3/uL — ABNORMAL HIGH (ref 1.7–7.7)
Neutrophils Relative %: 85 %
Platelets: 265 10*3/uL (ref 150–400)
RBC: 4.4 MIL/uL (ref 3.87–5.11)
RDW: 19.1 % — ABNORMAL HIGH (ref 11.5–15.5)
WBC: 16.4 10*3/uL — ABNORMAL HIGH (ref 4.0–10.5)
nRBC: 0 % (ref 0.0–0.2)

## 2019-05-13 LAB — RAPID URINE DRUG SCREEN, HOSP PERFORMED
Amphetamines: NOT DETECTED
Barbiturates: NOT DETECTED
Benzodiazepines: POSITIVE — AB
Cocaine: NOT DETECTED
Opiates: NOT DETECTED
Tetrahydrocannabinol: POSITIVE — AB

## 2019-05-13 LAB — I-STAT BETA HCG BLOOD, ED (MC, WL, AP ONLY): I-stat hCG, quantitative: <5 m[IU]/mL (ref <5)

## 2019-05-13 LAB — CBG MONITORING, ED: Glucose-Capillary: 101 mg/dL — ABNORMAL HIGH (ref 70–99)

## 2019-05-13 MED ORDER — LEVETIRACETAM IN NACL 1000 MG/100ML IV SOLN
1000.0000 mg | Freq: Once | INTRAVENOUS | Status: AC
  Administered 2019-05-13: 1000 mg via INTRAVENOUS
  Filled 2019-05-13: qty 100

## 2019-05-13 MED ORDER — SODIUM CHLORIDE 0.9 % IV BOLUS
500.0000 mL | Freq: Once | INTRAVENOUS | Status: AC
  Administered 2019-05-13: 500 mL via INTRAVENOUS

## 2019-05-13 MED ORDER — POTASSIUM CHLORIDE CRYS ER 20 MEQ PO TBCR
30.0000 meq | EXTENDED_RELEASE_TABLET | Freq: Once | ORAL | Status: AC
  Administered 2019-05-13: 18:00:00 30 meq via ORAL
  Filled 2019-05-13: qty 1

## 2019-05-13 MED ORDER — LEVETIRACETAM IN NACL 1000 MG/100ML IV SOLN
1000.0000 mg | Freq: Once | INTRAVENOUS | Status: AC
  Administered 2019-05-13: 15:00:00 1000 mg via INTRAVENOUS
  Filled 2019-05-13: qty 100

## 2019-05-13 NOTE — ED Provider Notes (Signed)
[facility] DEPT Provider Note   CSN: [id number] Arrival date & time: 05/13/19  1253     History No chief complaint on file.   Jasmine [name] is a 29 y.o. female presenting for evaluation after seizure.  Level 5 caveat due to altered mental status.  History provided by EMS.  Per EMS, patient was sitting on the couch while she had a witnessed tonic-clonic seizure.  Patient's husband was present at the time, and helped stabilize patient so she did not fall or hit her head.  Seizure lasted 45 seconds, and then patient entered a postictal phase.  Per EMS, patient remained postictal for most of the transport ride, until she had another tonic-clonic seizure.  She was given 2.5 of Versed just prior to arrival at the ER.  Per EMS, husband states patient has been out of medication, and upon pill count patient has been out for 1 to 2 weeks.  She has an appointment with her neurologist tomorrow to get a med refill.  Per EMS, family reports no recent illnesses or fevers.  EMS reports cbg of 97.  Additional history obtained from chart review.  Patient with a history of epilepsy, (?)cystic fibrosis, GERD, sleep apnea, asthma.  Patient was seen in the ER 3 times in March 2021, last time resulting in admission for status epilepticus.  At that time, her Keppra dose was increased from 750 twice daily to 1000 twice daily.  HPI     Past Medical History:  Diagnosis Date  . Asthma   . Complication of anesthesia    "I wake up during anesthesia" (10/14/2015)  . DVT (deep vein thrombosis) in pregnancy   . Epilepsy ([place])   . GERD (gastroesophageal reflux disease)   . Heart murmur    last work up age 57- no symtoms  . Pancreatitis   . Sickle cell trait ([place])   . Sleep apnea    "used to wear CPAP; don't have one here in [place] since I moved in 2014" (10/14/2015)    Patient Active Problem List   Diagnosis Date Noted  . Status epilepticus ([place]) 03/28/2019  . Moderate persistent  asthma 03/19/2019  . 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 Pelvic mass in female 12/25/2018  . Abnormal uterine bleeding   . Pelvic mass 12/22/2018  . Dehydration 12/22/2018  . Asthma exacerbation 12/01/2017  . Acute hypercapnic respiratory failure ([place]) 12/01/2017  . Exocrine pancreatic insufficiency 12/01/2017  . Facial contusion 07/17/2017  . Closed dislocation of right jaw 07/17/2017  . Nausea with hives 07/09/2017  . History of DVT (deep vein thrombosis) in pregnancy ([place]) 05/29/2017  . Sickle cell trait ([place]) 05/29/2017  . Acute asthma exacerbation 05/29/2017  . Chronic pancreatitis ([place]) 05/29/2017  . Seizures ([place]) 05/29/2017  . Tobacco abuse 05/29/2017  . Acute on chronic respiratory failure with hypoxemia ([place]) 05/29/2017  . SOB (shortness of breath) 04/08/2017  . Cough 04/07/2017  . Cystic fibrosis ([place]) 03/10/2017  . Acute on chronic respiratory failure with hypoxia ([place]) 03/10/2017  . Colitis 11/12/2016  . Seizure ([place]) 11/12/2016  . Calculus of bile duct with acute cholecystitis with obstruction 10/14/2015  . Adjustment disorder with mixed anxiety and depressed mood 04/26/2015  . Low blood potassium 04/19/2015  . Asthma with acute exacerbation 04/19/2015  . Asthma 01/03/2015  . Asthma, chronic, unspecified asthma severity, with acute exacerbation 01/03/2015  . Influenza-like illness 01/03/2015  . Status post cesarean section 11/08/2014  .  Previous cesarean section complicating pregnancy, antepartum condition or complication   . GERD (gastroesophageal reflux disease)   . Gastroesophageal reflux disease without esophagitis   . Abnormal biochemical finding on antenatal screening of mother   . Abnormal quad screen   . Echogenic focus of heart of fetus affecting antepartum care of mother   . Drug use affecting pregnancy, antepartum 05/19/2014  . Seizure disorder during pregnancy, antepartum ([place]) 05/13/2014   . Supervision of high risk pregnancy, antepartum 05/13/2014  . Asthma complicating pregnancy, antepartum 05/13/2014  . History of food anaphylaxis 05/13/2014  . Hypokalemia 04/08/2014  . Seizure disorder, sept 2015 last seizure 01/19/2013  . Anemia 01/19/2013  . Leukocytosis 01/19/2013    Past Surgical History:  Procedure Laterality Date  . APPENDECTOMY    . CESAREAN SECTION  2013  . CESAREAN SECTION N/A 11/08/2014   Procedure: CESAREAN SECTION;  Surgeon: Truett Mainland, DO;  Location: [facility];  Service: Obstetrics;  Laterality: N/A;  . CHOLECYSTECTOMY N/A 10/16/2015   Procedure: LAPAROSCOPIC CHOLECYSTECTOMY WITH POSSIBLE INTRAOPERATIVE CHOLANGIOGRAM;  Surgeon: Donnie Mesa, MD;  Location: [facility];  Service: General;  Laterality: N/A;  . CLOSED REDUCTION MANDIBLE WITH MANDIBULOMA N/A 07/17/2017   Procedure: REDUCTION TMJ WITH INTRAMAXILLARY FIXATION;  Surgeon: Diona Browner, DDS;  Location: [facility];  Service: Oral Surgery;  Laterality: N/A;  . ESOPHAGOGASTRODUODENOSCOPY (EGD) WITH PROPOFOL N/A 11/15/2016   Procedure: ESOPHAGOGASTRODUODENOSCOPY (EGD) WITH PROPOFOL;  Surgeon: Clarene Essex, MD;  Location: [facility];  Service: Endoscopy;  Laterality: N/A;  . INGUINAL HERNIA REPAIR Bilateral ~ 1996  . NERVE, TENDON AND ARTERY REPAIR Right 09/23/2012   Procedure: I&D and Repair As Necessary/Right Hand and Palm;  Surgeon: Roseanne Kaufman, MD;  Location: [facility];  Service: Orthopedics;  Laterality: Right;  . OMENTECTOMY N/A 12/25/2018   Procedure: OMENTECTOMY;  Surgeon: Lafonda Mosses, MD;  Location: [facility];  Service: Gynecology;  Laterality: N/A;  . SALPINGOOPHORECTOMY N/A 12/25/2018   Procedure: OPEN UNILATERAL  SALPINGO OOPHORECTOMY WITH STAGING;  Surgeon: Lafonda Mosses, MD;  Location: [facility];  Service: Gynecology;  Laterality: N/A;  . TONSILLECTOMY       OB History    Gravida  3   Para  3   Term  3   Preterm      AB      Living  3     SAB      TAB      Ectopic       Multiple  0   Live Births  3           Family History  Problem Relation Age of Onset  . Cancer Mother        cervical cancer  . Asthma Mother   . Hypertension Father   . Sickle cell anemia Father   . Asthma Sister   . Diabetes Maternal Aunt   . Lymphoma Maternal Grandmother     Social History   Tobacco Use  . Smoking status: Former Smoker    Packs/day: 0.25    Years: 4.00    Pack years: 1.00    Types: Cigarettes    Quit date: 10/02/2015    Years since quitting: 3.6  . Smokeless tobacco: Never Used  Substance Use Topics  . Alcohol use: No    Alcohol/week: 0.0 standard drinks  . Drug use: No    Home Medications Prior to Admission medications   Medication Sig Start Date End Date Taking? Authorizing Provider  albuterol (PROVENTIL HFA;VENTOLIN HFA) 108 (  90 Base) MCG/ACT inhaler Inhale 2 puffs into the lungs every 4 (four) hours as needed for wheezing or shortness of breath. 08/20/17   Clent Demark, PA-C  Ascorbic Acid (VITAMIN C) 100 MG CHEW Chew 1 tablet (100 mg total) by mouth daily. Patient not taking: Reported on 03/28/2019 08/20/17   Clent Demark, PA-C  cetirizine (ZYRTEC) 10 MG tablet Take 1 tablet (10 mg total) by mouth daily. Patient not taking: Reported on 03/28/2019 12/11/17   Ok Edwards, PA-C  docusate sodium (COLACE) 50 MG capsule Take 1 capsule (50 mg total) by mouth 2 (two) times daily as needed for mild constipation. Patient not taking: Reported on 03/28/2019 08/20/17   Clent Demark, PA-C  feeding supplement, ENSURE ENLIVE, (ENSURE ENLIVE) LIQD Take 237 mLs by mouth 2 (two) times daily between meals. 474 mL 3 times a day, provide 3-week supply Patient not taking: Reported on 03/28/2019 08/20/17   Clent Demark, PA-C  ferrous sulfate 325 (65 FE) MG tablet Take 1 tablet (325 mg total) by mouth daily. 03/29/19 04/28/19  Raiford Noble Latif, DO  levETIRAcetam (KEPPRA) 1000 MG tablet Take 1 tablet (1,000 mg total) by mouth 2 (two) times daily. 03/29/19    Sheikh, Omair Latif, DO  mometasone-formoterol (DULERA) 200-5 MCG/ACT AERO Inhale 2 puffs into the lungs 2 (two) times daily.    [provider]  ondansetron (ZOFRAN ODT) 8 MG disintegrating tablet Take 1 tablet (8 mg total) by mouth every 8 (eight) hours as needed for nausea or vomiting. Patient not taking: Reported on 03/28/2019 12/28/18   Everitt Amber, MD  senna-docusate (SENOKOT-S) 8.6-50 MG tablet Take 2 tablets by mouth at bedtime. For AFTER surgery, do not take if having diarrhea Patient not taking: Reported on 03/28/2019 12/26/18   Joylene John D, NP  SUMAtriptan (IMITREX) 50 MG tablet Take 1 tablet (50 mg total) by mouth daily. May take one more tablet two hours after the first. No more than two tablets per day. Patient not taking: Reported on 03/28/2019 01/06/18   Gildardo Pounds, NP  topiramate (TOPAMAX) 50 MG tablet Take 1 tablet (50 mg total) by mouth at bedtime. Patient not taking: Reported on 03/28/2019 01/06/18 03/25/19  Gildardo Pounds, NP    Allergies    Cheese, Chocolate, Ibuprofen, Ivp dye [iodinated diagnostic agents], Latex, Orange juice [orange oil], Other, Peach [prunus persica], Peanuts [peanut oil], Pear, Prednisone, Raspberry, Tylenol [acetaminophen], Amoxicillin, Apricot flavor, Doxycycline, Erythromycin, Milk of magnesia [magnesium hydroxide], and Penicillins  Review of Systems   Review of Systems  Unable to perform ROS: Mental status change  Neurological: Positive for seizures.    Physical Exam Updated Vital Signs BP 129/63 (BP Location: Right Arm)   Pulse 97   Temp 98.4 F (36.9 C) (Oral)   Resp (!) 23   Ht 5\' 5"  (1.651 m)   Wt 68 kg   SpO2 95%   BMI 24.95 kg/m   Physical Exam Vitals and nursing note reviewed.  Constitutional:      General: She is not in acute distress.    Appearance: She is well-developed.     Comments: Will open her eyes to sound, but not responding verbally and not following commands.  HENT:     Head: Normocephalic and  atraumatic.  Eyes:     Conjunctiva/sclera: Conjunctivae normal.     Pupils: Pupils are equal, round, and reactive to light.  Neck:     Comments: Moving head/neck without pain or stiffness Cardiovascular:  Rate and Rhythm: Normal rate and regular rhythm.     Pulses: Normal pulses.  Pulmonary:     Effort: Pulmonary effort is normal. No respiratory distress.     Breath sounds: Normal breath sounds. No wheezing.     Comments: Clear lung sounds Abdominal:     General: There is no distension.     Palpations: Abdomen is soft. There is no mass.     Tenderness: There is no abdominal tenderness. There is no guarding or rebound.  Musculoskeletal:        General: Normal range of motion.     Cervical back: Normal range of motion and neck supple.     Right lower leg: No edema.     Left lower leg: No edema.     Comments: No obvious deformity.  Radial pedal pulses 2+ bilaterally.  No leg pain or swelling.  Skin:    General: Skin is warm and dry.     Capillary Refill: Capillary refill takes less than 2 seconds.  Neurological:     GCS: GCS eye subscore is 3. GCS verbal subscore is 1. GCS motor subscore is 5.     ED Results / Procedures / Treatments   Labs (all labs ordered are listed, but only abnormal results are displayed) Labs Reviewed  CBC WITH DIFFERENTIAL/PLATELET - Abnormal; Notable for the following components:      Result Value   WBC 16.4 (*)    Hemoglobin 10.3 (*)    HCT 34.3 (*)    MCV 78.0 (*)    MCH 23.4 (*)    RDW 19.1 (*)    Neutro Abs 13.9 (*)    Abs Immature Granulocytes 0.22 (*)    All other components within normal limits  CBG MONITORING, ED - Abnormal; Notable for the following components:   Glucose-Capillary 101 (*)    All other components within normal limits  COMPREHENSIVE METABOLIC PANEL  URINALYSIS, ROUTINE W REFLEX MICROSCOPIC  RAPID URINE DRUG SCREEN, HOSP PERFORMED  I-STAT BETA HCG BLOOD, ED ([facility], [facility], [facility] ONLY)    EKG EKG  Interpretation  Date/Time:  Wednesday May 13 2019 13:26:45 EDT Ventricular Rate:  104 PR Interval:    QRS Duration: 84 QT Interval:  353 QTC Calculation: 465 R Axis:   38 Text Interpretation: Sinus tachycardia Nonspecific T abnormalities, anterior leads No acute changes No significant change since last tracing Confirmed by Varney Biles [phone]) on 05/13/2019 2:14:48 PM   Radiology No results found.  Procedures Procedures (including critical care time)  Medications Ordered in ED Medications  levETIRAcetam (KEPPRA) IVPB 1000 mg/100 mL premix (1,000 mg Intravenous New Bag/Given 05/13/19 1457)  levETIRAcetam (KEPPRA) IVPB 1000 mg/100 mL premix (0 mg Intravenous Stopped 05/13/19 1422)  sodium chloride 0.9 % bolus 500 mL (0 mLs Intravenous Stopped 05/13/19 1422)    ED Course  I have reviewed the triage vital signs and the nursing notes.  Pertinent labs & imaging results that were available during my care of the patient were reviewed by me and considered in my medical decision making (see chart for details).    MDM Rules/Calculators/A&P                      Patient presenting for evaluation after seizure.  On exam, patient is altered, will open her eyes to sound but will not respond verbally or follow commands.  This may be due to seizure versus recently receiving Versed.  She was mildly hypoxic at 88% on room  air, but this improved to mid 90s with 2 L via nasal cannula.  Once again this is likely secondary to seizure versus Versed.  Etiology of seizures is likely patient not having her medication for the past 1 to 2 weeks.  As such, loading dose of Keppra given.  Will obtain labs to ensure no metabolic abnormality. Pt had negative head CT in March, and with a h/o not taking meds for 1-2 wks, I do not believe she needs repeat.   On reassessment, patient's mental status is improved.  Spontaneous eye opening, will nod yes or no to questions, but is still not talking verbally. Will follow  simple commands. HR improved. Pr continues to need O2, per chart review has ha h/o sleep apnea, which may be contributing.   Case discussed with attending, Dr. Kathrynn Humble agrees to plan. Will give another gram (total of 2), as pt has been without her meds. As her mental status is improving, doubt status at this time.   Labs interpreted by me mild leukocytosis, likely secondary to seizure.  Stable anemia at 10.  Pregnancy negative.  EKG unchanged from previous. CMP pending.   Pt signed out to Waldon Reining, PA-C for f/u on CMP/UA and reassessment. If labs reassuring and pt becomes more awake, plan for d/c. If pt has another sz, has worsening mental status, or concerning lab anomalities, consider admit.    Final Clinical Impression(s) / ED Diagnoses Final diagnoses:  Seizure [facility])    Rx / [place] Orders ED Discharge Orders    None       Franchot Heidelberg, PA-C 05/13/19 [place], Ankit, MD 05/13/19 1542

## 2019-05-13 NOTE — ED Notes (Signed)
Pt ambulated in hallway with RN at her side. Pt did not need any assistance.

## 2019-05-13 NOTE — ED Provider Notes (Signed)
Patient is a 29 year old female who was handed off to me at shift change from PA-C [place].  Please refer to her note below for additional details but to summarize patient has a history of seizures.  She has been out of her Keppra which she takes 1000 mg of twice daily.  This morning her husband witnessed her seizure which lasted 45 seconds.  EMS was called and she was postictal at this time.  EMS told staff that she then seized again and she was given Versed.  Since arrival she has been sleeping.  Initially she would not open her eyes or follow commands but I been told this has since improved.  Patient will not converse but will nod yes and no and will open her eyes.  We are waiting on CMP and UA.  Her medical records indicate this is a recurrent issue and she was recently admitted on March 12 of this year for status epilepticus.  She had an EEG performed the next day showing no epileptiform activity.  MRI of the head performed the same day showing no acute changes or focal lesion.  Physical Exam  BP 129/63 (BP Location: Right Arm)   Pulse 97   Temp 98.4 F (36.9 C) (Oral)   Resp (!) 23   Ht 5\' 5"  (1.651 m)   Wt 68 kg   SpO2 95%   BMI 24.95 kg/m   Physical Exam Vitals and nursing note reviewed.  Constitutional:      General: She is in acute distress.     Appearance: Normal appearance. She is normal weight. She is not ill-appearing, toxic-appearing or diaphoretic.     Comments: Well-developed African-American female.  She is lying supine.  She appears extremely fatigued.  She was initially not answering questions but will now answer questions clearly in a whispered tone.  HENT:     Head: Normocephalic and atraumatic.     Right Ear: External ear normal.     Left Ear: External ear normal.     Nose: Nose normal.     Mouth/Throat:     Pharynx: Oropharynx is clear. No oropharyngeal exudate or posterior oropharyngeal erythema.  Eyes:     General: No scleral icterus.       Right eye: No  discharge.        Left eye: No discharge.     Extraocular Movements: Extraocular movements intact.     Pupils: Pupils are equal, round, and reactive to light.     Comments: Extraocular movements are intact  Cardiovascular:     Rate and Rhythm: Normal rate and regular rhythm.     Pulses: Normal pulses.     Heart sounds: Normal heart sounds. No murmur. No friction rub. No gallop.   Pulmonary:     Effort: Pulmonary effort is normal. No respiratory distress.     Breath sounds: Normal breath sounds. No stridor. No wheezing, rhonchi or rales.  Abdominal:     General: Abdomen is flat.     Tenderness: There is no abdominal tenderness.  Musculoskeletal:     Cervical back: Normal range of motion and neck supple. No tenderness.  Skin:    General: Skin is warm and dry.  Neurological:     General: No focal deficit present.     Mental Status: She is alert and oriented to person, place, and time.     Comments: Patient appears extremely fatigued but is oriented to person, place, time.  She has began answering questions clearly coherently.  Her distal sensation is intact.  Grip strength is 5 out of 5 in her bilateral upper extremities.  She can move all 4 extremities spontaneously without difficulty.  Negative pronator drift.  Extraocular movements are intact.  Psychiatric:        Mood and Affect: Mood normal.        Behavior: Behavior normal.    ED Course/Procedures     Procedures  MDM  3:09 PM patient is a 29 year old female with a history of seizures who had 2 witnessed seizures earlier today.  She typically takes Keppra 1000 mg twice daily but has been out of her medications.  Patient was given to me at shift change.  She has been sleeping and was placed on 2 L of O2 via nasal cannula.  She has a history of sleep apnea.  Her mental status has been improving.  She was given a 2000 mg Keppra loading dose IV.  Awaiting CMP and UA.  CBC shows a leukocytosis with neutrophilia.  Likely secondary to  her seizure and is consistent with her labs when being evaluated for prior seizures.  We will closely monitor and reassess.  3:23 PM I reassessed the patient.  She was sleeping comfortably.  She still appears fatigued but is now answering questions clearly.  She is oriented to person, place, time.  She does confirm that she has a follow-up appointment with her neurologist tomorrow for evaluation as well as to refill her Keppra.  Will wait on remaining labs.  4:10 PM UDS is positive for benzodiazepines and THC.  Patient was positive for THC at her prior visit and was given information on cessation.  Patient was given Versed by EMS.  UA is benign.  CMP shows hypokalemia 3.3 with a bicarb of 19.  Records indicate that she was hypokalemic at her prior visit a month ago and was given IV potassium.  4:26 PM reassessed patient again.  She is still quite fatigued.  She is now answering questions more clearly.  She is oriented.  Her vital signs are stable.  She is saturating well and is not tachycardic.  She states she stopped smoking marijuana 2 weeks ago.  She also states she does not consume alcohol.  She also tells me that she does have an appointment tomorrow to refill her Keppra and has been out of [place] for 1 day.  We will have nursing staff feed and ambulate the patient.  Will give her p.o. potassium for her deficiency.  8:27 PM reassessed patient and she states she is tired but is feeling much better.  Given her food would like to p.o. challenge at this time.  Nursing staff ambulated her earlier and patient needed assistance with ambulation.  She states she would like to ambulate again after she eats.  I will have the nursing staff do this.  9:19 PM patient has been ambulated and was able to do so without difficulty.  She did this unassisted.  Patient was also p.o. challenged.  This patient was discussed with and evaluated by my attending physician Dr. Lacretia Leigh.  Patient understands to refill her  Keppra tomorrow at her scheduled appointment.  She understands she can return to the emergency department with any new or worsening symptoms.  She verbalized understanding of the above plan he was amicable at the time of discharge.  Vital signs stable.  Patient discharged to home/self care.  Condition at discharge: Stable  Note: Portions of this report may have been transcribed using  voice recognition software. Every effort was made to ensure accuracy; however, inadvertent computerized transcription errors may be present.   Per [facility] statutes, patients with seizures are not allowed to drive until  they have been seizure-free for six months. Use caution when using heavy equipment or power tools. Avoid working on ladders or at heights. Take showers instead of baths. Ensure the water temperature is not too high on the home water heater. Do not go swimming alone. When caring for infants or small children, sit down when holding, feeding, or changing them to minimize risk of injury to the child in the event you have a seizure. Also, Maintain good sleep hygiene. Avoid alcohol.     Rayna Sexton, PA-C 05/13/19 2152    Lacretia Leigh, MD 05/15/19 [phone]

## 2019-05-13 NOTE — Discharge Instructions (Addendum)
Per our discussion, please refill your Keppra tomorrow at your prior scheduled appointment.  Please do not hesitate to return to the emergency department with any new or worsening symptoms.  Due to your seizure history it is important that you not drive or operate a motor vehicle for the next 6 months.  Please follow-up with a neurologist regarding this visit.

## 2019-05-13 NOTE — ED Notes (Signed)
Pt provided water, graham crackers, and peanut butter.  Pt encouraged to try PO intake. Will continue to monitor.

## 2019-05-13 NOTE — ED Notes (Signed)
Pt ambulated in hallway, one person assistance.  Pt with moderately unsteady gait at this time, ambulated appx 60 feet.  Pt assisted back to bed.

## 2019-05-13 NOTE — ED Triage Notes (Signed)
Arrives via EMS from home, C/C witnessed seizure by husband, patient was sitting on the couch, no falls. Lasted approx 45 seconds, had a second seizure with EMS. Patient is postictal and drowsy upon presentation. Been out of her seizure meds x2 weeks.

## 2019-05-16 DIAGNOSIS — J45901 Unspecified asthma with (acute) exacerbation: Secondary | ICD-10-CM | POA: Diagnosis not present

## 2019-05-16 DIAGNOSIS — R2689 Other abnormalities of gait and mobility: Secondary | ICD-10-CM | POA: Diagnosis not present

## 2019-05-16 DIAGNOSIS — J45909 Unspecified asthma, uncomplicated: Secondary | ICD-10-CM | POA: Diagnosis not present

## 2019-05-16 DIAGNOSIS — G4739 Other sleep apnea: Secondary | ICD-10-CM | POA: Diagnosis not present

## 2019-05-30 ENCOUNTER — Encounter (HOSPITAL_COMMUNITY): Payer: Self-pay

## 2019-05-30 ENCOUNTER — Other Ambulatory Visit: Payer: Self-pay

## 2019-05-30 ENCOUNTER — Emergency Department (HOSPITAL_COMMUNITY)
Admission: EM | Admit: 2019-05-30 | Discharge: 2019-05-30 | Disposition: A | Discharge status: Home / Self Care | Payer: Medicaid Other | Attending: Emergency Medicine | Admitting: Emergency Medicine

## 2019-05-30 DIAGNOSIS — Z91040 Latex allergy status: Secondary | ICD-10-CM | POA: Insufficient documentation

## 2019-05-30 DIAGNOSIS — R569 Unspecified convulsions: Secondary | ICD-10-CM | POA: Diagnosis not present

## 2019-05-30 DIAGNOSIS — R1900 Intra-abdominal and pelvic swelling, mass and lump, unspecified site: Secondary | ICD-10-CM | POA: Diagnosis not present

## 2019-05-30 DIAGNOSIS — R410 Disorientation, unspecified: Secondary | ICD-10-CM | POA: Diagnosis not present

## 2019-05-30 DIAGNOSIS — G4489 Other headache syndrome: Secondary | ICD-10-CM | POA: Diagnosis not present

## 2019-05-30 DIAGNOSIS — R404 Transient alteration of awareness: Secondary | ICD-10-CM | POA: Diagnosis not present

## 2019-05-30 DIAGNOSIS — Z91010 Allergy to peanuts: Secondary | ICD-10-CM | POA: Insufficient documentation

## 2019-05-30 DIAGNOSIS — Z79899 Other long term (current) drug therapy: Secondary | ICD-10-CM | POA: Diagnosis not present

## 2019-05-30 DIAGNOSIS — Z87891 Personal history of nicotine dependence: Secondary | ICD-10-CM | POA: Diagnosis not present

## 2019-05-30 DIAGNOSIS — G40909 Epilepsy, unspecified, not intractable, without status epilepticus: Secondary | ICD-10-CM | POA: Diagnosis not present

## 2019-05-30 DIAGNOSIS — R52 Pain, unspecified: Secondary | ICD-10-CM | POA: Diagnosis not present

## 2019-05-30 LAB — CBC WITH DIFFERENTIAL/PLATELET
Abs Immature Granulocytes: 0.12 10*3/uL — ABNORMAL HIGH (ref 0.00–0.07)
Basophils Absolute: 0.1 10*3/uL (ref 0.0–0.1)
Basophils Relative: 0 %
Eosinophils Absolute: 0.3 10*3/uL (ref 0.0–0.5)
Eosinophils Relative: 2 %
HCT: 31.4 % — ABNORMAL LOW (ref 36.0–46.0)
Hemoglobin: 9.3 g/dL — ABNORMAL LOW (ref 12.0–15.0)
Immature Granulocytes: 1 %
Lymphocytes Relative: 19 %
Lymphs Abs: 2.9 10*3/uL (ref 0.7–4.0)
MCH: 23.3 pg — ABNORMAL LOW (ref 26.0–34.0)
MCHC: 29.6 g/dL — ABNORMAL LOW (ref 30.0–36.0)
MCV: 78.7 fL — ABNORMAL LOW (ref 80.0–100.0)
Monocytes Absolute: 0.6 10*3/uL (ref 0.1–1.0)
Monocytes Relative: 4 %
Neutro Abs: 11 10*3/uL — ABNORMAL HIGH (ref 1.7–7.7)
Neutrophils Relative %: 74 %
Platelets: 301 10*3/uL (ref 150–400)
RBC: 3.99 MIL/uL (ref 3.87–5.11)
RDW: 18 % — ABNORMAL HIGH (ref 11.5–15.5)
WBC: 14.9 10*3/uL — ABNORMAL HIGH (ref 4.0–10.5)
nRBC: 0 % (ref 0.0–0.2)

## 2019-05-30 LAB — BASIC METABOLIC PANEL
Anion gap: 7 (ref 5–15)
BUN: 7 mg/dL (ref 6–20)
CO2: 25 mmol/L (ref 22–32)
Calcium: 8.9 mg/dL (ref 8.9–10.3)
Chloride: 104 mmol/L (ref 98–111)
Creatinine, Ser: 0.72 mg/dL (ref 0.44–1.00)
GFR calc Af Amer: >60 mL/min (ref >60)
GFR calc non Af Amer: >60 mL/min (ref >60)
Glucose, Bld: 82 mg/dL (ref 70–99)
Potassium: 3.9 mmol/L (ref 3.5–5.1)
Sodium: 136 mmol/L (ref 135–145)

## 2019-05-30 LAB — CBG MONITORING, ED: Glucose-Capillary: 86 mg/dL (ref 70–99)

## 2019-05-30 LAB — HCG, QUANTITATIVE, PREGNANCY: hCG, Beta Chain, Quant, S: 1 m[IU]/mL (ref <5)

## 2019-05-30 MED ORDER — SODIUM CHLORIDE 0.9 % IV BOLUS (SEPSIS)
1000.0000 mL | Freq: Once | INTRAVENOUS | Status: AC
  Administered 2019-05-30: 1000 mL via INTRAVENOUS

## 2019-05-30 MED ORDER — LEVETIRACETAM 1000 MG PO TABS
1000.0000 mg | ORAL_TABLET | Freq: Two times a day (BID) | ORAL | 0 refills | Status: DC
Start: 2019-05-30 — End: 2019-07-04

## 2019-05-30 MED ORDER — LEVETIRACETAM IN NACL 1000 MG/100ML IV SOLN
1000.0000 mg | Freq: Once | INTRAVENOUS | Status: AC
  Administered 2019-05-30: 1000 mg via INTRAVENOUS
  Filled 2019-05-30: qty 100

## 2019-05-30 NOTE — ED Provider Notes (Signed)
[facility] DEPT Provider Note   CSN: [id number] Arrival date & time: 05/30/19  0100     History Chief Complaint  Patient presents with  . Seizures   Level 5 caveat due to altered mental status Jasmine [name] is a 29 y.o. female.  The history is provided by the patient.  Seizures Seizure activity on arrival: no   Postictal symptoms: confusion and somnolence   Return to baseline: no   Severity:  Severe Progression:  Unchanged History of seizures: yes   Patient with history of seizures, on Keppra presents after seizure episode.  Patient arrives via EMS.  It is reported the patient initially had a seizure that lasted 45 seconds that terminated spontaneously.  No trauma reported.  On EMS arrival, patient had a seizure while leaving the house.  This lasted 25 seconds and terminated spontaneously.  Patient has long history of seizures.  Patient is not back to baseline    Past Medical History:  Diagnosis Date  . Asthma   . Complication of anesthesia    "I wake up during anesthesia" (10/14/2015)  . DVT (deep vein thrombosis) in pregnancy   . Epilepsy ([place])   . GERD (gastroesophageal reflux disease)   . Heart murmur    last work up age 4- no symtoms  . Pancreatitis   . Sickle cell trait ([place])   . Sleep apnea    "used to wear CPAP; don't have one here in [place] since I moved in 2014" (10/14/2015)    Patient Active Problem List   Diagnosis Date Noted  . Status epilepticus ([place]) 03/28/2019  . Moderate persistent asthma 03/19/2019  . 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 Pelvic mass in female 12/25/2018  . Abnormal uterine bleeding   . Pelvic mass 12/22/2018  . Dehydration 12/22/2018  . Asthma exacerbation 12/01/2017  . Acute hypercapnic respiratory failure ([place]) 12/01/2017  . Exocrine pancreatic insufficiency 12/01/2017  . Facial contusion 07/17/2017  . Closed dislocation of right jaw  07/17/2017  . Nausea with hives 07/09/2017  . History of DVT (deep vein thrombosis) in pregnancy ([place]) 05/29/2017  . Sickle cell trait ([place]) 05/29/2017  . Acute asthma exacerbation 05/29/2017  . Chronic pancreatitis ([place]) 05/29/2017  . Seizures ([place]) 05/29/2017  . Tobacco abuse 05/29/2017  . Acute on chronic respiratory failure with hypoxemia ([place]) 05/29/2017  . SOB (shortness of breath) 04/08/2017  . Cough 04/07/2017  . 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 Acute on chronic respiratory failure with hypoxia ([place]) 03/10/2017  . Colitis 11/12/2016  . Seizure ([place]) 11/12/2016  . Calculus of bile duct with acute cholecystitis with obstruction 10/14/2015  . Adjustment disorder with mixed anxiety and depressed mood 04/26/2015  . Low blood potassium 04/19/2015  . Asthma with acute exacerbation 04/19/2015  . Asthma 01/03/2015  . Asthma, chronic, unspecified asthma severity, with acute exacerbation 01/03/2015  . Influenza-like illness 01/03/2015  . Status post cesarean section 11/08/2014  . Previous cesarean section complicating pregnancy, antepartum condition or complication   . GERD (gastroesophageal reflux disease)   . Gastroesophageal reflux disease without esophagitis   . Abnormal biochemical finding on antenatal screening of mother   . Abnormal quad screen   . Echogenic focus of heart of fetus affecting antepartum care of mother   . Drug use affecting pregnancy, antepartum 05/19/2014  . Seizure disorder during pregnancy, antepartum ([place]) 05/13/2014  . Supervision of high risk pregnancy, antepartum 05/13/2014  . Asthma complicating pregnancy, antepartum 05/13/2014  .  History of food anaphylaxis 05/13/2014  . Hypokalemia 04/08/2014  . Seizure disorder, sept 2015 last seizure 01/19/2013  . Anemia 01/19/2013  . Leukocytosis 01/19/2013    Past Surgical History:  Procedure Laterality Date  . APPENDECTOMY    . CESAREAN SECTION  2013  . CESAREAN SECTION N/A 11/08/2014   Procedure:  CESAREAN SECTION;  Surgeon: Truett Mainland, DO;  Location: [facility];  Service: Obstetrics;  Laterality: N/A;  . CHOLECYSTECTOMY N/A 10/16/2015   Procedure: LAPAROSCOPIC CHOLECYSTECTOMY WITH POSSIBLE INTRAOPERATIVE CHOLANGIOGRAM;  Surgeon: Donnie Mesa, MD;  Location: [facility];  Service: General;  Laterality: N/A;  . CLOSED REDUCTION MANDIBLE WITH MANDIBULOMA N/A 07/17/2017   Procedure: REDUCTION TMJ WITH INTRAMAXILLARY FIXATION;  Surgeon: Diona Browner, DDS;  Location: [facility];  Service: Oral Surgery;  Laterality: N/A;  . ESOPHAGOGASTRODUODENOSCOPY (EGD) WITH PROPOFOL N/A 11/15/2016   Procedure: ESOPHAGOGASTRODUODENOSCOPY (EGD) WITH PROPOFOL;  Surgeon: Clarene Essex, MD;  Location: [facility];  Service: Endoscopy;  Laterality: N/A;  . INGUINAL HERNIA REPAIR Bilateral ~ 1996  . NERVE, TENDON AND ARTERY REPAIR Right 09/23/2012   Procedure: I&D and Repair As Necessary/Right Hand and Palm;  Surgeon: Roseanne Kaufman, MD;  Location: [facility];  Service: Orthopedics;  Laterality: Right;  . OMENTECTOMY N/A 12/25/2018   Procedure: OMENTECTOMY;  Surgeon: Lafonda Mosses, MD;  Location: [facility];  Service: Gynecology;  Laterality: N/A;  . SALPINGOOPHORECTOMY N/A 12/25/2018   Procedure: OPEN UNILATERAL  SALPINGO OOPHORECTOMY WITH STAGING;  Surgeon: Lafonda Mosses, MD;  Location: [facility];  Service: Gynecology;  Laterality: N/A;  . TONSILLECTOMY       OB History    Gravida  3   Para  3   Term  3   Preterm      AB      Living  3     SAB      TAB      Ectopic      Multiple  0   Live Births  3           Family History  Problem Relation Age of Onset  . Cancer Mother        cervical cancer  . Asthma Mother   . Hypertension Father   . Sickle cell anemia Father   . Asthma Sister   . Diabetes Maternal Aunt   . Lymphoma Maternal Grandmother     Social History   Tobacco Use  . Smoking status: Former Smoker    Packs/day: 0.25    Years: 4.00    Pack years: 1.00    Types: Cigarettes     Quit date: 10/02/2015    Years since quitting: 3.6  . Smokeless tobacco: Never Used  Substance Use Topics  . Alcohol use: No    Alcohol/week: 0.0 standard drinks  . Drug use: No    Home Medications Prior to Admission medications   Medication Sig Start Date End Date Taking? Authorizing Provider  albuterol (PROVENTIL HFA;VENTOLIN HFA) 108 (90 Base) MCG/ACT inhaler Inhale 2 puffs into the lungs every 4 (four) hours as needed for wheezing or shortness of breath. 08/20/17   Clent Demark, PA-C  ferrous sulfate 325 (65 FE) MG tablet Take 1 tablet (325 mg total) by mouth daily. 03/29/19 04/28/19  Raiford Noble Latif, DO  levETIRAcetam (KEPPRA) 1000 MG tablet Take 1 tablet (1,000 mg total) by mouth 2 (two) times daily. 03/29/19   Sheikh, Omair Latif, DO  mometasone-formoterol (DULERA) 200-5 MCG/ACT AERO Inhale 2 puffs into the lungs 2 (  two) times daily.    [provider]  SUMAtriptan (IMITREX) 50 MG tablet Take 1 tablet (50 mg total) by mouth daily. May take one more tablet two hours after the first. No more than two tablets per day. Patient not taking: Reported on 03/28/2019 01/06/18   Gildardo Pounds, NP  topiramate (TOPAMAX) 50 MG tablet Take 1 tablet (50 mg total) by mouth at bedtime. Patient not taking: Reported on 03/28/2019 01/06/18 03/25/19  Gildardo Pounds, NP  cetirizine (ZYRTEC) 10 MG tablet Take 1 tablet (10 mg total) by mouth daily. Patient not taking: Reported on 03/28/2019 12/11/17 05/30/19  Ok Edwards, PA-C    Allergies    Cheese, Chocolate, Ibuprofen, Ivp dye [iodinated diagnostic agents], Latex, Orange juice [orange oil], Other, Peach [prunus persica], Peanuts [peanut oil], Pear, Prednisone, Raspberry, Tylenol [acetaminophen], Amoxicillin, Apricot flavor, Doxycycline, Erythromycin, Milk of magnesia [magnesium hydroxide], and Penicillins  Review of Systems   Review of Systems  Unable to perform ROS: Mental status change  Neurological: Positive for seizures.    Physical  Exam Updated Vital Signs BP 114/79   Pulse 93   Temp 98 F (36.7 C) (Oral)   Resp (!) 39   SpO2 99%   Physical Exam CONSTITUTIONAL: Disheveled, somnolent HEAD: Normocephalic/atraumatic, no signs of trauma EYES: EOMI/PERRL ENMT: Mucous membranes moist NECK: supple no meningeal signs SPINE/BACK:entire spine nontender CV: S1/S2 noted, no murmurs/rubs/gallops noted LUNGS: Lungs are clear to auscultation bilaterally, no apparent distress ABDOMEN: soft, nontender NEURO: Pt is somnolent, but will wake up but is not conversant. EXTREMITIES: pulses normal/equal, full ROM, no signs of trauma SKIN: warm, color normal PSYCH: Unable to assess  ED Results / Procedures / Treatments   Labs (all labs ordered are listed, but only abnormal results are displayed) Labs Reviewed  CBC WITH DIFFERENTIAL/PLATELET - Abnormal; Notable for the following components:      Result Value   WBC 14.9 (*)    Hemoglobin 9.3 (*)    HCT 31.4 (*)    MCV 78.7 (*)    MCH 23.3 (*)    MCHC 29.6 (*)    RDW 18.0 (*)    Neutro Abs 11.0 (*)    Abs Immature Granulocytes 0.12 (*)    All other components within normal limits  BASIC METABOLIC PANEL  HCG, QUANTITATIVE, PREGNANCY  CBG MONITORING, ED    EKG EKG Interpretation  Date/Time:  Saturday May 30 2019 02:22:01 EDT Ventricular Rate:  72 PR Interval:    QRS Duration: 94 QT Interval:  427 QTC Calculation: 468 R Axis:   71 Text Interpretation: Sinus rhythm LVH by voltage ST elev, probable normal early repol pattern Confirmed by Ripley Fraise [phone]) on 05/30/2019 2:24:35 AM   Radiology No results found.  Procedures Procedures   Medications Ordered in ED Medications  sodium chloride 0.9 % bolus 1,000 mL (0 mLs Intravenous Stopped 05/30/19 0527)  levETIRAcetam (KEPPRA) IVPB 1000 mg/100 mL premix (0 mg Intravenous Stopped 05/30/19 0320)    ED Course  I have reviewed the triage vital signs and the nursing notes.  Pertinent labs results that were  available during my care of the patient were reviewed by me and considered in my medical decision making (see chart for details).    MDM Rules/Calculators/A&P                      2:24 AM Patient with known history of seizures presents with seizures.  It is reported patient is noncompliant.  Patient  is currently in a postictal state.  Labs are pending this time. Will follow closely.  No indication for imaging at this time due to history of seizures. 4:16 AM Patient continues to rest comfortably.  I have attempted to call family but no answer. Patient sleeping but easily arousable but is still  not speaking to staff.  We will continue to monitor 6:45 AM Patient slept for several hours.  She is now awake and alert and speaking.  She is taking p.o. fluids.  She reports she ran out of her Keppra.  She has a follow-up with neurology next month.  1 month supply of Keppra has been ordered. Patient feels back to baseline She was advised to avoid driving and bathing/swimming alone Final Clinical Impression(s) / ED Diagnoses Final diagnoses:  Seizure [facility])    Rx / DC Orders ED Discharge Orders         Ordered    levETIRAcetam (KEPPRA) 1000 MG tablet  2 times daily     05/30/19 [id number]           Ripley Fraise, MD 05/30/19 [phone]

## 2019-05-30 NOTE — Discharge Instructions (Addendum)
Please be aware you may have another seizure ° °Do not drive until seen by your physician for your condition ° °Do not climb ladders/roofs/trees as a seizure can occur at that height and cause serious harm ° °Do not bathe/swim alone as a seizure can occur and cause serious harm ° °Please followup with your physician or neurologist for further testing and possible treatment ° ° °

## 2019-05-30 NOTE — ED Notes (Signed)
Pt alert and oriented able to walk in room and drink fluids

## 2019-05-30 NOTE — ED Triage Notes (Signed)
Per EMS, Pts husband witnessed pt have a seizure, apprx 45 seconds. Pt did not fall, no trauma noted anywhere. Pt has been out of her Keppra.  Pt had a second seizure while exiting the house, apprx 25 seconds. Pt has had multiple seizures since April 28. Pt is alert, but has not been speak at this time.

## 2019-06-10 ENCOUNTER — Telehealth (INDEPENDENT_AMBULATORY_CARE_PROVIDER_SITE_OTHER): Payer: Self-pay

## 2019-06-10 NOTE — Telephone Encounter (Signed)
Patient aware that it is too early for refills. Also explained to her that she has never been seen by the provider here she would need to wait until appointment. Referral to neurology was placed for patient but [facility] for seizures but they unable to reach patient. Provided patient the number to [facility] as they will be the ones managing and treating her seizures. She verbalized understanding.

## 2019-06-10 NOTE — Telephone Encounter (Signed)
Patient called to request a refill for   levETIRAcetam (KEPPRA) 1000 MG tablet   Patient has filled the RX that was [facility] from the Hospital on May 15 but will like to have extra refills. Patient also scheduled an appt for June 10 @ 9:30 am.  Patient uses  [facility] on [address]  Please advice [phone] or [phone] Richardson Landry (husband) patient gave permission to speak with him.

## 2019-06-16 DIAGNOSIS — R2689 Other abnormalities of gait and mobility: Secondary | ICD-10-CM | POA: Diagnosis not present

## 2019-06-16 DIAGNOSIS — J45909 Unspecified asthma, uncomplicated: Secondary | ICD-10-CM | POA: Diagnosis not present

## 2019-06-16 DIAGNOSIS — J45901 Unspecified asthma with (acute) exacerbation: Secondary | ICD-10-CM | POA: Diagnosis not present

## 2019-06-16 DIAGNOSIS — G4739 Other sleep apnea: Secondary | ICD-10-CM | POA: Diagnosis not present

## 2019-06-25 ENCOUNTER — Inpatient Hospital Stay (INDEPENDENT_AMBULATORY_CARE_PROVIDER_SITE_OTHER): Payer: Medicaid Other | Admitting: Primary Care

## 2019-07-04 ENCOUNTER — Observation Stay (HOSPITAL_COMMUNITY)
Admission: EM | Admit: 2019-07-04 | Discharge: 2019-07-05 | DRG: 101 | Discharge status: Against Medical Advice | Payer: Medicaid Other | Attending: Internal Medicine | Admitting: Internal Medicine

## 2019-07-04 ENCOUNTER — Emergency Department (HOSPITAL_COMMUNITY)
Admission: EM | Admit: 2019-07-04 | Discharge: 2019-07-04 | Disposition: A | Discharge status: Home / Self Care | Payer: Medicaid Other | Source: Home / Self Care | Attending: Emergency Medicine | Admitting: Emergency Medicine

## 2019-07-04 ENCOUNTER — Encounter (HOSPITAL_COMMUNITY): Payer: Self-pay | Admitting: *Deleted

## 2019-07-04 ENCOUNTER — Emergency Department (HOSPITAL_COMMUNITY): Payer: Medicaid Other

## 2019-07-04 ENCOUNTER — Other Ambulatory Visit: Payer: Self-pay

## 2019-07-04 ENCOUNTER — Encounter (HOSPITAL_COMMUNITY): Payer: Self-pay

## 2019-07-04 DIAGNOSIS — R569 Unspecified convulsions: Secondary | ICD-10-CM | POA: Insufficient documentation

## 2019-07-04 DIAGNOSIS — Z91040 Latex allergy status: Secondary | ICD-10-CM

## 2019-07-04 DIAGNOSIS — Z888 Allergy status to other drugs, medicaments and biological substances status: Secondary | ICD-10-CM | POA: Diagnosis not present

## 2019-07-04 DIAGNOSIS — Z87891 Personal history of nicotine dependence: Secondary | ICD-10-CM | POA: Insufficient documentation

## 2019-07-04 DIAGNOSIS — Z881 Allergy status to other antibiotic agents status: Secondary | ICD-10-CM | POA: Diagnosis not present

## 2019-07-04 DIAGNOSIS — Z880 Allergy status to penicillin: Secondary | ICD-10-CM

## 2019-07-04 DIAGNOSIS — R404 Transient alteration of awareness: Secondary | ICD-10-CM | POA: Diagnosis not present

## 2019-07-04 DIAGNOSIS — Z91018 Allergy to other foods: Secondary | ICD-10-CM | POA: Diagnosis not present

## 2019-07-04 DIAGNOSIS — I10 Essential (primary) hypertension: Secondary | ICD-10-CM | POA: Diagnosis not present

## 2019-07-04 DIAGNOSIS — J45901 Unspecified asthma with (acute) exacerbation: Secondary | ICD-10-CM | POA: Insufficient documentation

## 2019-07-04 DIAGNOSIS — J45909 Unspecified asthma, uncomplicated: Secondary | ICD-10-CM | POA: Diagnosis present

## 2019-07-04 DIAGNOSIS — Z91041 Radiographic dye allergy status: Secondary | ICD-10-CM | POA: Diagnosis not present

## 2019-07-04 DIAGNOSIS — D72829 Elevated white blood cell count, unspecified: Secondary | ICD-10-CM | POA: Diagnosis present

## 2019-07-04 DIAGNOSIS — Z91010 Allergy to peanuts: Secondary | ICD-10-CM | POA: Diagnosis not present

## 2019-07-04 DIAGNOSIS — R4182 Altered mental status, unspecified: Secondary | ICD-10-CM | POA: Insufficient documentation

## 2019-07-04 DIAGNOSIS — G40909 Epilepsy, unspecified, not intractable, without status epilepticus: Secondary | ICD-10-CM | POA: Diagnosis not present

## 2019-07-04 DIAGNOSIS — Z20822 Contact with and (suspected) exposure to covid-19: Secondary | ICD-10-CM | POA: Diagnosis not present

## 2019-07-04 DIAGNOSIS — D509 Iron deficiency anemia, unspecified: Secondary | ICD-10-CM

## 2019-07-04 DIAGNOSIS — E876 Hypokalemia: Secondary | ICD-10-CM | POA: Diagnosis not present

## 2019-07-04 DIAGNOSIS — Z825 Family history of asthma and other chronic lower respiratory diseases: Secondary | ICD-10-CM

## 2019-07-04 DIAGNOSIS — Z79899 Other long term (current) drug therapy: Secondary | ICD-10-CM | POA: Diagnosis not present

## 2019-07-04 DIAGNOSIS — D573 Sickle-cell trait: Secondary | ICD-10-CM | POA: Diagnosis present

## 2019-07-04 DIAGNOSIS — R000 Tachycardia, unspecified: Secondary | ICD-10-CM | POA: Diagnosis not present

## 2019-07-04 DIAGNOSIS — Z5329 Procedure and treatment not carried out because of patient's decision for other reasons: Secondary | ICD-10-CM | POA: Diagnosis not present

## 2019-07-04 DIAGNOSIS — R410 Disorientation, unspecified: Secondary | ICD-10-CM | POA: Diagnosis not present

## 2019-07-04 DIAGNOSIS — R9431 Abnormal electrocardiogram [ECG] [EKG]: Secondary | ICD-10-CM | POA: Diagnosis not present

## 2019-07-04 DIAGNOSIS — R32 Unspecified urinary incontinence: Secondary | ICD-10-CM | POA: Diagnosis present

## 2019-07-04 DIAGNOSIS — Z86718 Personal history of other venous thrombosis and embolism: Secondary | ICD-10-CM

## 2019-07-04 DIAGNOSIS — Z87892 Personal history of anaphylaxis: Secondary | ICD-10-CM

## 2019-07-04 DIAGNOSIS — G4730 Sleep apnea, unspecified: Secondary | ICD-10-CM | POA: Diagnosis not present

## 2019-07-04 DIAGNOSIS — G40409 Other generalized epilepsy and epileptic syndromes, not intractable, without status epilepticus: Principal | ICD-10-CM | POA: Diagnosis present

## 2019-07-04 DIAGNOSIS — Z9114 Patient's other noncompliance with medication regimen: Secondary | ICD-10-CM | POA: Diagnosis not present

## 2019-07-04 DIAGNOSIS — K219 Gastro-esophageal reflux disease without esophagitis: Secondary | ICD-10-CM | POA: Diagnosis not present

## 2019-07-04 DIAGNOSIS — Z832 Family history of diseases of the blood and blood-forming organs and certain disorders involving the immune mechanism: Secondary | ICD-10-CM

## 2019-07-04 LAB — COMPREHENSIVE METABOLIC PANEL
ALT: 19 U/L (ref 0–44)
AST: 25 U/L (ref 15–41)
Albumin: 3.8 g/dL (ref 3.5–5.0)
Alkaline Phosphatase: 49 U/L (ref 38–126)
Anion gap: 11 (ref 5–15)
BUN: 6 mg/dL (ref 6–20)
CO2: 23 mmol/L (ref 22–32)
Calcium: 8.9 mg/dL (ref 8.9–10.3)
Chloride: 102 mmol/L (ref 98–111)
Creatinine, Ser: 0.58 mg/dL (ref 0.44–1.00)
GFR calc Af Amer: >60 mL/min (ref >60)
GFR calc non Af Amer: >60 mL/min (ref >60)
Glucose, Bld: 83 mg/dL (ref 70–99)
Potassium: 2.7 mmol/L — CL (ref 3.5–5.1)
Sodium: 136 mmol/L (ref 135–145)
Total Bilirubin: 0.7 mg/dL (ref 0.3–1.2)
Total Protein: 6.5 g/dL (ref 6.5–8.1)

## 2019-07-04 LAB — CBC WITH DIFFERENTIAL/PLATELET
Abs Immature Granulocytes: 0.08 10*3/uL — ABNORMAL HIGH (ref 0.00–0.07)
Abs Immature Granulocytes: 0.07 10*3/uL (ref 0.00–0.07)
Basophils Absolute: 0 10*3/uL (ref 0.0–0.1)
Basophils Absolute: 0 10*3/uL (ref 0.0–0.1)
Basophils Relative: 0 %
Basophils Relative: 0 %
Eosinophils Absolute: 0.1 10*3/uL (ref 0.0–0.5)
Eosinophils Absolute: 0.1 10*3/uL (ref 0.0–0.5)
Eosinophils Relative: 1 %
Eosinophils Relative: 0 %
HCT: 35.2 % — ABNORMAL LOW (ref 36.0–46.0)
HCT: 29.8 % — ABNORMAL LOW (ref 36.0–46.0)
Hemoglobin: 10.8 g/dL — ABNORMAL LOW (ref 12.0–15.0)
Hemoglobin: 8.9 g/dL — ABNORMAL LOW (ref 12.0–15.0)
Immature Granulocytes: 1 %
Immature Granulocytes: 1 %
Lymphocytes Relative: 9 %
Lymphocytes Relative: 20 %
Lymphs Abs: 1.5 10*3/uL (ref 0.7–4.0)
Lymphs Abs: 2.9 10*3/uL (ref 0.7–4.0)
MCH: 23.3 pg — ABNORMAL LOW (ref 26.0–34.0)
MCH: 22.6 pg — ABNORMAL LOW (ref 26.0–34.0)
MCHC: 30.7 g/dL (ref 30.0–36.0)
MCHC: 29.9 g/dL — ABNORMAL LOW (ref 30.0–36.0)
MCV: 76 fL — ABNORMAL LOW (ref 80.0–100.0)
MCV: 75.8 fL — ABNORMAL LOW (ref 80.0–100.0)
Monocytes Absolute: 0.6 10*3/uL (ref 0.1–1.0)
Monocytes Absolute: 0.8 10*3/uL (ref 0.1–1.0)
Monocytes Relative: 3 %
Monocytes Relative: 5 %
Neutro Abs: 14.8 10*3/uL — ABNORMAL HIGH (ref 1.7–7.7)
Neutro Abs: 10.9 10*3/uL — ABNORMAL HIGH (ref 1.7–7.7)
Neutrophils Relative %: 86 %
Neutrophils Relative %: 74 %
Platelets: 337 10*3/uL (ref 150–400)
Platelets: 313 10*3/uL (ref 150–400)
RBC: 4.63 MIL/uL (ref 3.87–5.11)
RBC: 3.93 MIL/uL (ref 3.87–5.11)
RDW: 16.7 % — ABNORMAL HIGH (ref 11.5–15.5)
RDW: 16.6 % — ABNORMAL HIGH (ref 11.5–15.5)
WBC: 17.1 10*3/uL — ABNORMAL HIGH (ref 4.0–10.5)
WBC: 14.7 10*3/uL — ABNORMAL HIGH (ref 4.0–10.5)
nRBC: 0 % (ref 0.0–0.2)
nRBC: 0 % (ref 0.0–0.2)

## 2019-07-04 LAB — BASIC METABOLIC PANEL
Anion gap: 7 (ref 5–15)
BUN: 9 mg/dL (ref 6–20)
CO2: 26 mmol/L (ref 22–32)
Calcium: 8.7 mg/dL — ABNORMAL LOW (ref 8.9–10.3)
Chloride: 106 mmol/L (ref 98–111)
Creatinine, Ser: 0.66 mg/dL (ref 0.44–1.00)
GFR calc Af Amer: >60 mL/min (ref >60)
GFR calc non Af Amer: >60 mL/min (ref >60)
Glucose, Bld: 84 mg/dL (ref 70–99)
Potassium: 3.4 mmol/L — ABNORMAL LOW (ref 3.5–5.1)
Sodium: 139 mmol/L (ref 135–145)

## 2019-07-04 LAB — I-STAT BETA HCG BLOOD, ED (MC, WL, AP ONLY): I-stat hCG, quantitative: <5 m[IU]/mL (ref <5)

## 2019-07-04 LAB — HCG, QUANTITATIVE, PREGNANCY: hCG, Beta Chain, Quant, S: 1 m[IU]/mL (ref <5)

## 2019-07-04 LAB — CBG MONITORING, ED
Glucose-Capillary: 103 mg/dL — ABNORMAL HIGH (ref 70–99)
Glucose-Capillary: 79 mg/dL (ref 70–99)

## 2019-07-04 MED ORDER — SODIUM CHLORIDE 0.9 % IV BOLUS
1000.0000 mL | Freq: Once | INTRAVENOUS | Status: AC
  Administered 2019-07-04: 1000 mL via INTRAVENOUS

## 2019-07-04 MED ORDER — LEVETIRACETAM IN NACL 1000 MG/100ML IV SOLN
1000.0000 mg | Freq: Once | INTRAVENOUS | Status: AC
  Administered 2019-07-04: 1000 mg via INTRAVENOUS
  Filled 2019-07-04: qty 100

## 2019-07-04 MED ORDER — POTASSIUM CHLORIDE 10 MEQ/100ML IV SOLN
10.0000 meq | Freq: Once | INTRAVENOUS | Status: AC
  Administered 2019-07-04: 10 meq via INTRAVENOUS
  Filled 2019-07-04: qty 100

## 2019-07-04 MED ORDER — LEVETIRACETAM 1000 MG PO TABS
1000.0000 mg | ORAL_TABLET | Freq: Two times a day (BID) | ORAL | 0 refills | Status: DC
Start: 2019-07-04 — End: 2019-07-10

## 2019-07-04 MED ORDER — TOPIRAMATE 25 MG PO TABS
50.0000 mg | ORAL_TABLET | Freq: Once | ORAL | Status: AC
  Administered 2019-07-05: 50 mg via ORAL
  Filled 2019-07-04: qty 2

## 2019-07-04 NOTE — ED Triage Notes (Signed)
The pt arrived by [facility] from home  shere she has been having seizures  X 3  She had a seizure tonight ems was called they did not see the seizure  She has been placed on keppra  She was just diagnosed with a brain tumor    Her keppra was increased todaY??

## 2019-07-04 NOTE — ED Notes (Signed)
THE PT IS ALERT ON ARRIVAL  SMALL CUT TO HER UPPER LIP

## 2019-07-04 NOTE — ED Notes (Signed)
CBG Results of 79 reported to [place], Therapist, sports.

## 2019-07-04 NOTE — ED Notes (Signed)
CBG 120 

## 2019-07-04 NOTE — ED Triage Notes (Signed)
EMS reports from home, Husband called for seizures x 3 since 0800. Hx of seizures. Keppra recently increased from 500mg  to 1000mg  per day. No oral trauma noted. Stated Pt compliant with meds.  BP 124/86 RR 18 HR 116 Sp02 97 RA CBG 111

## 2019-07-04 NOTE — ED Provider Notes (Signed)
[facility] EMERGENCY DEPARTMENT Provider Note   CSN: [id number] Arrival date & time: 07/04/19  2113     History Chief Complaint  Patient presents with  . Seizures    Jasmine [name] is a 29 y.o. female past no history of seizures, sickle cell trait, DVT, epilepsy brought in by EMS for evaluation of seizures.  Most of the history is provided by her husband.  Patient was seen at [facility] today for evaluation of seizures.  Husband reports that she was having seizures in her sleep.  She was given loading dose of Keppra at [place] [facility].  She returned to baseline and was discharged home.  Husband states that she was at home and was walking to the kitchen when she dropped her phone.  He went over to her and he states that she had a seizure that lasted for about a minute.  It stopped and then about 5 minutes later, she had another seizure with full body shaking, tongue biting.  He states that that one stopped after about 2 or 3 minutes.  And then about 5 minutes later, she had a third seizure.  He states that he tried to hold her head to the did not hit the ground.  By the time EMS had gotten there, she was not having any more seizures.  They did not see any seizure activity.  Husband states that patient was postictal afterwards. Patient did not take her evening dose of Keppra.  Husband reports that she may be was just recently diagnosed with a brain tumor but he is unsure.  She is scheduled to see a new neurologistin a few weeks.  She is complaining of a headache and some neck pain here in the ED. She denies any fevers, chills, CP, SOB, abdominal pain, nausea/vomiting. She endorses marijuana use yesterday. No other drugs or ETOH.   The history is provided by the patient.       Past Medical History:  Diagnosis Date  . Asthma   . Complication of anesthesia    "I wake up during anesthesia" (10/14/2015)  . DVT (deep vein thrombosis) in pregnancy   . Epilepsy ([place])   . GERD  (gastroesophageal reflux disease)   . Heart murmur    last work up age 22- no symtoms  . Pancreatitis   . Sickle cell trait ([place])   . Sleep apnea    "used to wear CPAP; don't have one here in [place] since I moved in 2014" (10/14/2015)    Patient Active Problem List   Diagnosis Date Noted  . Status epilepticus ([place]) 03/28/2019  . Moderate persistent asthma 03/19/2019  . 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 Pelvic mass in female 12/25/2018  . Abnormal uterine bleeding   . Pelvic mass 12/22/2018  . Dehydration 12/22/2018  . Asthma exacerbation 12/01/2017  . Acute hypercapnic respiratory failure ([place]) 12/01/2017  . Exocrine pancreatic insufficiency 12/01/2017  . Facial contusion 07/17/2017  . Closed dislocation of right jaw 07/17/2017  . Nausea with hives 07/09/2017  . History of DVT (deep vein thrombosis) in pregnancy ([place]) 05/29/2017  . Sickle cell trait ([place]) 05/29/2017  . Acute asthma exacerbation 05/29/2017  . Chronic pancreatitis ([place]) 05/29/2017  . Seizures ([place]) 05/29/2017  . Tobacco abuse 05/29/2017  . Acute on chronic respiratory failure with hypoxemia ([place]) 05/29/2017  . SOB (shortness of breath) 04/08/2017  . Cough 04/07/2017  . 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 Acute on  chronic respiratory failure with hypoxia ([place]) 03/10/2017  . Colitis 11/12/2016  . Seizure ([place]) 11/12/2016  . Calculus of bile duct with acute cholecystitis with obstruction 10/14/2015  . Adjustment disorder with mixed anxiety and depressed mood 04/26/2015  . Low blood potassium 04/19/2015  . Asthma with acute exacerbation 04/19/2015  . Asthma 01/03/2015  . Asthma, chronic, unspecified asthma severity, with acute exacerbation 01/03/2015  . Influenza-like illness 01/03/2015  . Status post cesarean section 11/08/2014  . Previous cesarean section complicating pregnancy, antepartum condition or complication   . GERD (gastroesophageal reflux  disease)   . Gastroesophageal reflux disease without esophagitis   . Abnormal biochemical finding on antenatal screening of mother   . Abnormal quad screen   . Echogenic focus of heart of fetus affecting antepartum care of mother   . Drug use affecting pregnancy, antepartum 05/19/2014  . Seizure disorder during pregnancy, antepartum ([place]) 05/13/2014  . Supervision of high risk pregnancy, antepartum 05/13/2014  . Asthma complicating pregnancy, antepartum 05/13/2014  . History of food anaphylaxis 05/13/2014  . Hypokalemia 04/08/2014  . Seizure disorder, sept 2015 last seizure 01/19/2013  . Anemia 01/19/2013  . Leukocytosis 01/19/2013    Past Surgical History:  Procedure Laterality Date  . APPENDECTOMY    . CESAREAN SECTION  2013  . CESAREAN SECTION N/A 11/08/2014   Procedure: CESAREAN SECTION;  Surgeon: Truett Mainland, DO;  Location: [facility];  Service: Obstetrics;  Laterality: N/A;  . CHOLECYSTECTOMY N/A 10/16/2015   Procedure: LAPAROSCOPIC CHOLECYSTECTOMY WITH POSSIBLE INTRAOPERATIVE CHOLANGIOGRAM;  Surgeon: Donnie Mesa, MD;  Location: [facility];  Service: General;  Laterality: N/A;  . CLOSED REDUCTION MANDIBLE WITH MANDIBULOMA N/A 07/17/2017   Procedure: REDUCTION TMJ WITH INTRAMAXILLARY FIXATION;  Surgeon: Diona Browner, DDS;  Location: [facility];  Service: Oral Surgery;  Laterality: N/A;  . ESOPHAGOGASTRODUODENOSCOPY (EGD) WITH PROPOFOL N/A 11/15/2016   Procedure: ESOPHAGOGASTRODUODENOSCOPY (EGD) WITH PROPOFOL;  Surgeon: Clarene Essex, MD;  Location: [facility];  Service: Endoscopy;  Laterality: N/A;  . INGUINAL HERNIA REPAIR Bilateral ~ 1996  . NERVE, TENDON AND ARTERY REPAIR Right 09/23/2012   Procedure: I&D and Repair As Necessary/Right Hand and Palm;  Surgeon: Roseanne Kaufman, MD;  Location: [facility];  Service: Orthopedics;  Laterality: Right;  . OMENTECTOMY N/A 12/25/2018   Procedure: OMENTECTOMY;  Surgeon: Lafonda Mosses, MD;  Location: [facility];  Service: Gynecology;  Laterality: N/A;  .  SALPINGOOPHORECTOMY N/A 12/25/2018   Procedure: OPEN UNILATERAL  SALPINGO OOPHORECTOMY WITH STAGING;  Surgeon: Lafonda Mosses, MD;  Location: [facility];  Service: Gynecology;  Laterality: N/A;  . TONSILLECTOMY       OB History    Gravida  3   Para  3   Term  3   Preterm      AB      Living  3     SAB      TAB      Ectopic      Multiple  0   Live Births  3           Family History  Problem Relation Age of Onset  . Cancer Mother        cervical cancer  . Asthma Mother   . Hypertension Father   . Sickle cell anemia Father   . Asthma Sister   . Diabetes Maternal Aunt   . Lymphoma Maternal Grandmother     Social History   Tobacco Use  . Smoking status: Former Smoker    Packs/day: 0.25  Years: 4.00    Pack years: 1.00    Types: Cigarettes    Quit date: 10/02/2015    Years since quitting: 3.7  . Smokeless tobacco: Never Used  Vaping Use  . Vaping Use: Never used  Substance Use Topics  . Alcohol use: No    Alcohol/week: 0.0 standard drinks  . Drug use: No    Home Medications Prior to Admission medications   Medication Sig Start Date End Date Taking? Authorizing Provider  albuterol (PROVENTIL HFA;VENTOLIN HFA) 108 (90 Base) MCG/ACT inhaler Inhale 2 puffs into the lungs every 4 (four) hours as needed for wheezing or shortness of breath. Patient not taking: Reported on 07/04/2019 08/20/17   Clent Demark, PA-C  levETIRAcetam (KEPPRA) 1000 MG tablet Take 1 tablet (1,000 mg total) by mouth 2 (two) times daily. 07/04/19   Malvin Johns, MD  levETIRAcetam (KEPPRA) 1000 MG tablet Take 1,000 mg by mouth 2 (two) times daily.    [provider]  mometasone-formoterol (DULERA) 200-5 MCG/ACT AERO Inhale 2 puffs into the lungs 2 (two) times daily. Patient not taking: Reported on 07/04/2019    [provider]  SUMAtriptan (IMITREX) 50 MG tablet Take 1 tablet (50 mg total) by mouth daily. May take one more tablet two hours after the first. No  more than two tablets per day. Patient not taking: Reported on 03/28/2019 01/06/18   Gildardo Pounds, NP  topiramate (TOPAMAX) 50 MG tablet Take 1 tablet (50 mg total) by mouth at bedtime. Patient not taking: Reported on 03/28/2019 01/06/18 03/25/19  Gildardo Pounds, NP  cetirizine (ZYRTEC) 10 MG tablet Take 1 tablet (10 mg total) by mouth daily. Patient not taking: Reported on 03/28/2019 12/11/17 05/30/19  Ok Edwards, PA-C    Allergies    Cheese, Chocolate, Ibuprofen, Ivp dye [iodinated diagnostic agents], Latex, Orange juice [orange oil], Other, Peach [prunus persica], Peanuts [peanut oil], Pear, Prednisone, Raspberry, Tylenol [acetaminophen], Amoxicillin, Apricot flavor, Doxycycline, Erythromycin, Milk of magnesia [magnesium hydroxide], and Penicillins  Review of Systems   Review of Systems  Constitutional: Negative for fever.  Eyes: Negative for visual disturbance.  Respiratory: Negative for cough and shortness of breath.   Cardiovascular: Negative for chest pain.  Gastrointestinal: Negative for abdominal pain, nausea and vomiting.  Genitourinary: Negative for dysuria and hematuria.  Musculoskeletal: Positive for neck pain.  Neurological: Positive for seizures and headaches. Negative for weakness and numbness.  All other systems reviewed and are negative.   Physical Exam Updated Vital Signs BP 114/77 (BP Location: Left Arm)   Pulse 82   Temp 98.3 F (36.8 C) (Oral)   Resp 17   Ht 5\' 5"  (1.651 m)   Wt 68 kg   SpO2 100%   BMI 24.95 kg/m   Physical Exam Vitals and nursing note reviewed.  Constitutional:      Appearance: Normal appearance. She is well-developed.  HENT:     Head: Normocephalic and atraumatic.      Comments: Small area of ecchymosis noted over the right zygomatic arch. No other evidence of injury.    Mouth/Throat:     Comments: Small tongue lac noted from presumed bite  Eyes:     General: Lids are normal.     Conjunctiva/sclera: Conjunctivae normal.      Pupils: Pupils are equal, round, and reactive to light.     Comments: PERRL. EOMs intact. No nystagmus. No neglect.   Neck:     Comments: Tenderness palpation of midline. No deformity or crepitus noted.  Cardiovascular:     Rate and Rhythm: Normal rate and regular rhythm.     Pulses: Normal pulses.     Heart sounds: Normal heart sounds. No murmur heard.  No friction rub. No gallop.   Pulmonary:     Effort: Pulmonary effort is normal.     Breath sounds: Normal breath sounds.  Abdominal:     Palpations: Abdomen is soft. Abdomen is not rigid.     Tenderness: There is no abdominal tenderness. There is no guarding.     Comments: Abdomen is soft, non-distended, non-tender. No rigidity, No guarding. No peritoneal signs.  Musculoskeletal:        General: Normal range of motion.  Skin:    General: Skin is warm and dry.     Capillary Refill: Capillary refill takes less than 2 seconds.  Neurological:     Mental Status: She is alert and oriented to person, place, and time.     Comments: Cranial nerves III-XII intact Follows commands, Moves all extremities  5/5 strength to BUE and BLE  Sensation intact throughout all major nerve distributions  No slurred speech. No facial droop.   Psychiatric:        Speech: Speech normal.     ED Results / Procedures / Treatments   Labs (all labs ordered are listed, but only abnormal results are displayed) Labs Reviewed  COMPREHENSIVE METABOLIC PANEL - Abnormal; Notable for the following components:      Result Value   Potassium 2.7 (*)    All other components within normal limits  CBC WITH DIFFERENTIAL/PLATELET - Abnormal; Notable for the following components:   WBC 14.7 (*)    Hemoglobin 8.9 (*)    HCT 29.8 (*)    MCV 75.8 (*)    MCH 22.6 (*)    MCHC 29.9 (*)    RDW 16.6 (*)    Neutro Abs 10.9 (*)    All other components within normal limits  SARS CORONAVIRUS 2 BY RT PCR (HOSPITAL ORDER, [facility])  RAPID URINE  DRUG SCREEN, HOSP PERFORMED  URINALYSIS, ROUTINE W REFLEX MICROSCOPIC  MAGNESIUM  ETHANOL  CBG MONITORING, ED  I-STAT BETA HCG BLOOD, ED ([facility], [facility], [facility] ONLY)    EKG EKG Interpretation  Date/Time:  Saturday July 04 2019 21:21:12 EDT Ventricular Rate:  86 PR Interval:    QRS Duration: 94 QT Interval:  395 QTC Calculation: 473 R Axis:   61 Text Interpretation: Sinus rhythm Borderline Q waves in lateral leads Confirmed by Dene Gentry [phone]) on 07/04/2019 9:25:17 PM   Radiology CT Head Wo Contrast  Result Date: 07/04/2019 CLINICAL DATA:  Seizures EXAM: CT HEAD WITHOUT CONTRAST TECHNIQUE: Contiguous axial images were obtained from the base of the skull through the vertex without intravenous contrast. COMPARISON:  March 28, 2019 FINDINGS: Brain: No evidence of acute territorial infarction, hemorrhage, hydrocephalus,extra-axial collection or mass lesion/mass effect. Normal gray-white differentiation. Ventricles are normal in size and contour. Vascular: No hyperdense vessel or unexpected calcification. Skull: The skull is intact. No fracture or focal lesion identified. Sinuses/Orbits: The visualized paranasal sinuses and mastoid air cells are clear. The orbits and globes intact. Other: None Cervical spine: Alignment: There is straightening of the normal cervical lordosis. Skull base and vertebrae: Visualized skull base is intact. No atlanto-occipital dissociation. The vertebral body heights are well maintained. No fracture or pathologic osseous lesion seen. Soft tissues and spinal canal: The visualized paraspinal soft tissues are unremarkable. No prevertebral soft tissue swelling is seen.  The spinal canal is grossly unremarkable, no large epidural collection or significant canal narrowing. Disc levels: No significant canal neural foraminal narrowing is seen. Upper chest: Subpleural blebs and biapical centrilobular emphysematous changes are seen. Thoracic inlet is within normal limits. Other: None  IMPRESSION: No acute intracranial abnormality. No acute fracture or malalignment of the spine. Emphysema (ICD10-J43.9). Electronically Signed   By: Prudencio Pair M.D.   On: 07/04/2019 22:18   CT Cervical Spine Wo Contrast  Result Date: 07/04/2019 CLINICAL DATA:  Seizures EXAM: CT HEAD WITHOUT CONTRAST TECHNIQUE: Contiguous axial images were obtained from the base of the skull through the vertex without intravenous contrast. COMPARISON:  March 28, 2019 FINDINGS: Brain: No evidence of acute territorial infarction, hemorrhage, hydrocephalus,extra-axial collection or mass lesion/mass effect. Normal gray-white differentiation. Ventricles are normal in size and contour. Vascular: No hyperdense vessel or unexpected calcification. Skull: The skull is intact. No fracture or focal lesion identified. Sinuses/Orbits: The visualized paranasal sinuses and mastoid air cells are clear. The orbits and globes intact. Other: None Cervical spine: Alignment: There is straightening of the normal cervical lordosis. Skull base and vertebrae: Visualized skull base is intact. No atlanto-occipital dissociation. The vertebral body heights are well maintained. No fracture or pathologic osseous lesion seen. Soft tissues and spinal canal: The visualized paraspinal soft tissues are unremarkable. No prevertebral soft tissue swelling is seen. The spinal canal is grossly unremarkable, no large epidural collection or significant canal narrowing. Disc levels: No significant canal neural foraminal narrowing is seen. Upper chest: Subpleural blebs and biapical centrilobular emphysematous changes are seen. Thoracic inlet is within normal limits. Other: None IMPRESSION: No acute intracranial abnormality. No acute fracture or malalignment of the spine. Emphysema (ICD10-J43.9). Electronically Signed   By: Prudencio Pair M.D.   On: 07/04/2019 22:18    Procedures Procedures (including critical care time)  Medications Ordered in ED Medications    potassium chloride 10 mEq in 100 mL IVPB (has no administration in time range)  topiramate (TOPAMAX) tablet 50 mg (has no administration in time range)  sodium chloride 0.9 % bolus 1,000 mL (1,000 mLs Intravenous New Bag/Given 07/04/19 2240)  levETIRAcetam (KEPPRA) IVPB 1000 mg/100 mL premix (0 mg Intravenous Stopped 07/04/19 2337)  levETIRAcetam (KEPPRA) IVPB 1000 mg/100 mL premix (0 mg Intravenous Stopped 07/04/19 2302)    ED Course  I have reviewed the triage vital signs and the nursing notes.  Pertinent labs & imaging results that were available during my care of the patient were reviewed by me and considered in my medical decision making (see chart for details).    MDM Rules/Calculators/A&P                            29 y.o. F brought in by EMS for evaluation of seizure.  Patient was seen at [facility] earlier this morning for evaluation of seizure.  She husband reports she had a seizure in her sleep.  She had not had her morning medication.  She was giving a loading dose of Keppra and was observed.  She returned back to baseline and was discharged home.  Patient was at home.  She had not yet taken her evening dose.  She had 3 other seizures per husband who witnessed them.  Patient was postictal and had tongue lack.  On EMS arrival, patient was postictal, tired but no seizure activity witnessed by EMS.  On initially arrival, she is afebrile, nontoxic-appearing.  She does appear slightly postictal and  is tired but is able to answer questions appropriately.  She has a small tongue laceration noted.  Patient is complaining of head and neck pain.  I also discussed with husband and he thinks that she was recently diagnosed with a brain tumor.  After review of her records, I don't see any mention of diagnosis of brain tumor. Will get imaging of her head to assess.  He states that he does not know the details.  He reports that she is supposed to see a new neurologist in the next few weeks.  I-stat  beta negative. CBC shows slight leukocytosis of 14.7. Hgb is 8.9. She has had anemia in the past.   Head and neck CT negative for any acute abnormality.   At this time, patient has multiple seizures today.  Given recurrence of seizures feel that admission is warranted for observation.   Discussed patient with Dr. Cheral Marker (Neurology). He is aware of the patient. At this time he feels like this is most likely due to medication non-compliance given that she had the seizures after the not having her morning or evening dose. He recommends giving a keppra load of 2000 mg as well as oral Topaxmax. Recommends obtaining MRI to ensure no brain tumor. He also recommends adding ETOH and Magnesium. He recommends that patient be observed for further seizure activity but at this time does not feel it needs an official neuro consult.   Will plan for medical admission given multiple seizures and hypokalemia. Discussed patient with Dr. Francia Greaves who is agreeable to plan.   Discussed patient with Dr. Marlowe Sax [facility]) who accepts patient for admission.   Portions of this note were generated with [employer]. Dictation errors may occur despite best attempts at proofreading.   Final Clinical Impression(s) / ED Diagnoses Final diagnoses:  Seizure ([place])  Hypokalemia    Rx / DC Orders ED Discharge Orders    None       Desma Mcgregor 07/04/19 2340    Valarie Merino, MD 07/09/19 1115

## 2019-07-04 NOTE — ED Provider Notes (Signed)
[place] DEPT Provider Note   CSN: [id number] Arrival date & time: 07/04/19  1031     History Chief Complaint  Patient presents with  . Seizures    Jasmine [name] is a 29 y.o. female.  Patient is a 29 year old female with a history of seizures who presents after having a seizure today.  Per EMS, she had 3 seizures this morning and reportedly had 2 seizures that were witnessed by EMS.  Her Keppra was reportedly increased from 500 mg twice daily to 1000 mg twice daily recently.  She was noted to be incontinent of urine by EMS.  She does have a history of prior noncompliance with her Keppra and has had frequent ED visits.  She remains with altered mental status following the seizures.  History is limited due to this.        Past Medical History:  Diagnosis Date  . Asthma   . Complication of anesthesia    "I wake up during anesthesia" (10/14/2015)  . DVT (deep vein thrombosis) in pregnancy   . Epilepsy ([place])   . GERD (gastroesophageal reflux disease)   . Heart murmur    last work up age 40- no symtoms  . Pancreatitis   . Sickle cell trait ([place])   . Sleep apnea    "used to wear CPAP; don't have one here in [place] since I moved in 2014" (10/14/2015)    Patient Active Problem List   Diagnosis Date Noted  . Status epilepticus ([place]) 03/28/2019  . Moderate persistent asthma 03/19/2019  . 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 Pelvic mass in female 12/25/2018  . Abnormal uterine bleeding   . Pelvic mass 12/22/2018  . Dehydration 12/22/2018  . Asthma exacerbation 12/01/2017  . Acute hypercapnic respiratory failure ([place]) 12/01/2017  . Exocrine pancreatic insufficiency 12/01/2017  . Facial contusion 07/17/2017  . Closed dislocation of right jaw 07/17/2017  . Nausea with hives 07/09/2017  . History of DVT (deep vein thrombosis) in pregnancy ([place]) 05/29/2017  . Sickle cell trait ([place])  05/29/2017  . Acute asthma exacerbation 05/29/2017  . Chronic pancreatitis ([place]) 05/29/2017  . Seizures ([place]) 05/29/2017  . Tobacco abuse 05/29/2017  . Acute on chronic respiratory failure with hypoxemia ([place]) 05/29/2017  . SOB (shortness of breath) 04/08/2017  . Cough 04/07/2017  . 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 Acute on chronic respiratory failure with hypoxia ([place]) 03/10/2017  . Colitis 11/12/2016  . Seizure ([place]) 11/12/2016  . Calculus of bile duct with acute cholecystitis with obstruction 10/14/2015  . Adjustment disorder with mixed anxiety and depressed mood 04/26/2015  . Low blood potassium 04/19/2015  . Asthma with acute exacerbation 04/19/2015  . Asthma 01/03/2015  . Asthma, chronic, unspecified asthma severity, with acute exacerbation 01/03/2015  . Influenza-like illness 01/03/2015  . Status post cesarean section 11/08/2014  . Previous cesarean section complicating pregnancy, antepartum condition or complication   . GERD (gastroesophageal reflux disease)   . Gastroesophageal reflux disease without esophagitis   . Abnormal biochemical finding on antenatal screening of mother   . Abnormal quad screen   . Echogenic focus of heart of fetus affecting antepartum care of mother   . Drug use affecting pregnancy, antepartum 05/19/2014  . Seizure disorder during pregnancy, antepartum ([place]) 05/13/2014  . Supervision of high risk pregnancy, antepartum 05/13/2014  . Asthma complicating pregnancy, antepartum 05/13/2014  . History of food anaphylaxis 05/13/2014  . Hypokalemia 04/08/2014  . Seizure disorder, sept  2015 last seizure 01/19/2013  . Anemia 01/19/2013  . Leukocytosis 01/19/2013    Past Surgical History:  Procedure Laterality Date  . APPENDECTOMY    . CESAREAN SECTION  2013  . CESAREAN SECTION N/A 11/08/2014   Procedure: CESAREAN SECTION;  Surgeon: Truett Mainland, DO;  Location: [facility];  Service: Obstetrics;  Laterality: N/A;  . CHOLECYSTECTOMY N/A 10/16/2015    Procedure: LAPAROSCOPIC CHOLECYSTECTOMY WITH POSSIBLE INTRAOPERATIVE CHOLANGIOGRAM;  Surgeon: Donnie Mesa, MD;  Location: [facility];  Service: General;  Laterality: N/A;  . CLOSED REDUCTION MANDIBLE WITH MANDIBULOMA N/A 07/17/2017   Procedure: REDUCTION TMJ WITH INTRAMAXILLARY FIXATION;  Surgeon: Diona Browner, DDS;  Location: [facility];  Service: Oral Surgery;  Laterality: N/A;  . ESOPHAGOGASTRODUODENOSCOPY (EGD) WITH PROPOFOL N/A 11/15/2016   Procedure: ESOPHAGOGASTRODUODENOSCOPY (EGD) WITH PROPOFOL;  Surgeon: Clarene Essex, MD;  Location: [facility];  Service: Endoscopy;  Laterality: N/A;  . INGUINAL HERNIA REPAIR Bilateral ~ 1996  . NERVE, TENDON AND ARTERY REPAIR Right 09/23/2012   Procedure: I&D and Repair As Necessary/Right Hand and Palm;  Surgeon: Roseanne Kaufman, MD;  Location: [facility];  Service: Orthopedics;  Laterality: Right;  . OMENTECTOMY N/A 12/25/2018   Procedure: OMENTECTOMY;  Surgeon: Lafonda Mosses, MD;  Location: [facility];  Service: Gynecology;  Laterality: N/A;  . SALPINGOOPHORECTOMY N/A 12/25/2018   Procedure: OPEN UNILATERAL  SALPINGO OOPHORECTOMY WITH STAGING;  Surgeon: Lafonda Mosses, MD;  Location: [facility];  Service: Gynecology;  Laterality: N/A;  . TONSILLECTOMY       OB History    Gravida  3   Para  3   Term  3   Preterm      AB      Living  3     SAB      TAB      Ectopic      Multiple  0   Live Births  3           Family History  Problem Relation Age of Onset  . Cancer Mother        cervical cancer  . Asthma Mother   . Hypertension Father   . Sickle cell anemia Father   . Asthma Sister   . Diabetes Maternal Aunt   . Lymphoma Maternal Grandmother     Social History   Tobacco Use  . Smoking status: Former Smoker    Packs/day: 0.25    Years: 4.00    Pack years: 1.00    Types: Cigarettes    Quit date: 10/02/2015    Years since quitting: 3.7  . Smokeless tobacco: Never Used  Vaping Use  . Vaping Use: Never used  Substance Use  Topics  . Alcohol use: No    Alcohol/week: 0.0 standard drinks  . Drug use: No    Home Medications Prior to Admission medications   Medication Sig Start Date End Date Taking? Authorizing Provider  albuterol (PROVENTIL HFA;VENTOLIN HFA) 108 (90 Base) MCG/ACT inhaler Inhale 2 puffs into the lungs every 4 (four) hours as needed for wheezing or shortness of breath. 08/20/17   Clent Demark, PA-C  levETIRAcetam (KEPPRA) 1000 MG tablet Take 1 tablet (1,000 mg total) by mouth 2 (two) times daily. 07/04/19   Malvin Johns, MD  mometasone-formoterol (DULERA) 200-5 MCG/ACT AERO Inhale 2 puffs into the lungs 2 (two) times daily.    [provider]  SUMAtriptan (IMITREX) 50 MG tablet Take 1 tablet (50 mg total) by mouth daily. May take one more tablet two  hours after the first. No more than two tablets per day. Patient not taking: Reported on 03/28/2019 01/06/18   Gildardo Pounds, NP  topiramate (TOPAMAX) 50 MG tablet Take 1 tablet (50 mg total) by mouth at bedtime. Patient not taking: Reported on 03/28/2019 01/06/18 03/25/19  Gildardo Pounds, NP  cetirizine (ZYRTEC) 10 MG tablet Take 1 tablet (10 mg total) by mouth daily. Patient not taking: Reported on 03/28/2019 12/11/17 05/30/19  Ok Edwards, PA-C    Allergies    Cheese, Chocolate, Ibuprofen, Ivp dye [iodinated diagnostic agents], Latex, Orange juice [orange oil], Other, Peach [prunus persica], Peanuts [peanut oil], Pear, Prednisone, Raspberry, Tylenol [acetaminophen], Amoxicillin, Apricot flavor, Doxycycline, Erythromycin, Milk of magnesia [magnesium hydroxide], and Penicillins  Review of Systems   Review of Systems  Unable to perform ROS: Mental status change    Physical Exam Updated Vital Signs BP 128/76   Pulse 84   Temp 98.8 F (37.1 C) (Oral)   Resp (!) 21   SpO2 97%   Physical Exam Constitutional:      Appearance: She is well-developed.  HENT:     Head: Normocephalic and atraumatic.  Eyes:     Pupils: Pupils are  equal, round, and reactive to light.  Cardiovascular:     Rate and Rhythm: Normal rate and regular rhythm.     Heart sounds: Normal heart sounds.  Pulmonary:     Effort: Pulmonary effort is normal. No respiratory distress.     Breath sounds: Normal breath sounds. No wheezing or rales.  Chest:     Chest wall: No tenderness.  Abdominal:     General: Bowel sounds are normal.     Palpations: Abdomen is soft.     Tenderness: There is no abdominal tenderness. There is no guarding or rebound.  Musculoskeletal:        General: Normal range of motion.     Cervical back: Normal range of motion and neck supple.  Lymphadenopathy:     Cervical: No cervical adenopathy.  Skin:    General: Skin is warm and dry.     Findings: No rash.  Neurological:     Mental Status: She is alert.     Comments: Is awake with eyes open but is nonverbal.  Will not follow commands.  She does seem to be moving all extremities symmetrically.     ED Results / Procedures / Treatments   Labs (all labs ordered are listed, but only abnormal results are displayed) Labs Reviewed  BASIC METABOLIC PANEL - Abnormal; Notable for the following components:      Result Value   Potassium 3.4 (*)    Calcium 8.7 (*)    All other components within normal limits  CBC WITH DIFFERENTIAL/PLATELET - Abnormal; Notable for the following components:   WBC 17.1 (*)    Hemoglobin 10.8 (*)    HCT 35.2 (*)    MCV 76.0 (*)    MCH 23.3 (*)    RDW 16.7 (*)    Neutro Abs 14.8 (*)    Abs Immature Granulocytes 0.08 (*)    All other components within normal limits  CBG MONITORING, ED - Abnormal; Notable for the following components:   Glucose-Capillary 103 (*)    All other components within normal limits  HCG, QUANTITATIVE, PREGNANCY  I-STAT BETA HCG BLOOD, ED ([facility], [facility], [facility] ONLY)    EKG None  Radiology No results found.  Procedures Procedures (including critical care time)  Medications Ordered in ED Medications  levETIRAcetam  (  KEPPRA) IVPB 1000 mg/100 mL premix (0 mg Intravenous Stopped 07/04/19 1157)    ED Course  I have reviewed the triage vital signs and the nursing notes.  Pertinent labs & imaging results that were available during my care of the patient were reviewed by me and considered in my medical decision making (see chart for details).    MDM Rules/Calculators/A&P                          Patient is a 29 year old female who presents after having some seizures this morning.  She says that she took her last dose of Keppra last night and then had run out so she did not take it this morning.  Her seizures happened later in the morning.  She does have a history of noncompliance.  She says that she has been taking her Keppra recently.  She is currently on 1000 mg twice daily which was increased during a prior hospitalization in the spring.  She is currently back to baseline and has been monitored for about 4 hours with no further seizure activity or change in mental status.  Her labs are nonconcerning.  She was discharged home in good condition.  She was stressed the importance of taking her medication regularly and not letting it run out.  She was also encouraged to have close follow-up with her neurologist.  Return precautions were given. Final Clinical Impression(s) / ED Diagnoses Final diagnoses:  Seizure ([place])    Rx / DC Orders ED Discharge Orders         Ordered    levETIRAcetam (KEPPRA) 1000 MG tablet  2 times daily     Discontinue  Reprint     07/04/19 1404           Malvin Johns, MD 07/04/19 1406

## 2019-07-05 ENCOUNTER — Inpatient Hospital Stay (HOSPITAL_COMMUNITY): Payer: Medicaid Other

## 2019-07-05 ENCOUNTER — Observation Stay (HOSPITAL_COMMUNITY): Payer: Medicaid Other

## 2019-07-05 DIAGNOSIS — R569 Unspecified convulsions: Secondary | ICD-10-CM

## 2019-07-05 DIAGNOSIS — E876 Hypokalemia: Secondary | ICD-10-CM | POA: Diagnosis not present

## 2019-07-05 LAB — BASIC METABOLIC PANEL
Anion gap: 6 (ref 5–15)
BUN: <5 mg/dL — ABNORMAL LOW (ref 6–20)
CO2: 23 mmol/L (ref 22–32)
Calcium: 7.9 mg/dL — ABNORMAL LOW (ref 8.9–10.3)
Chloride: 109 mmol/L (ref 98–111)
Creatinine, Ser: 0.59 mg/dL (ref 0.44–1.00)
GFR calc Af Amer: >60 mL/min (ref >60)
GFR calc non Af Amer: >60 mL/min (ref >60)
Glucose, Bld: 94 mg/dL (ref 70–99)
Potassium: 2.7 mmol/L — CL (ref 3.5–5.1)
Sodium: 138 mmol/L (ref 135–145)

## 2019-07-05 LAB — URINALYSIS, ROUTINE W REFLEX MICROSCOPIC
Bilirubin Urine: NEGATIVE
Glucose, UA: NEGATIVE mg/dL
Hgb urine dipstick: NEGATIVE
Ketones, ur: 5 mg/dL — AB
Leukocytes,Ua: NEGATIVE
Nitrite: NEGATIVE
Protein, ur: NEGATIVE mg/dL
Specific Gravity, Urine: 1.009 (ref 1.005–1.030)
pH: 7 (ref 5.0–8.0)

## 2019-07-05 LAB — IRON AND TIBC
Iron: 30 ug/dL (ref 28–170)
Saturation Ratios: 7 % — ABNORMAL LOW (ref 10.4–31.8)
TIBC: 447 ug/dL (ref 250–450)
UIBC: 417 ug/dL

## 2019-07-05 LAB — CBC
HCT: 26.2 % — ABNORMAL LOW (ref 36.0–46.0)
Hemoglobin: 7.9 g/dL — ABNORMAL LOW (ref 12.0–15.0)
MCH: 22.8 pg — ABNORMAL LOW (ref 26.0–34.0)
MCHC: 30.2 g/dL (ref 30.0–36.0)
MCV: 75.7 fL — ABNORMAL LOW (ref 80.0–100.0)
Platelets: 265 10*3/uL (ref 150–400)
RBC: 3.46 MIL/uL — ABNORMAL LOW (ref 3.87–5.11)
RDW: 16.5 % — ABNORMAL HIGH (ref 11.5–15.5)
WBC: 11.3 10*3/uL — ABNORMAL HIGH (ref 4.0–10.5)
nRBC: 0 % (ref 0.0–0.2)

## 2019-07-05 LAB — RAPID URINE DRUG SCREEN, HOSP PERFORMED
Amphetamines: NOT DETECTED
Barbiturates: NOT DETECTED
Benzodiazepines: POSITIVE — AB
Cocaine: NOT DETECTED
Opiates: NOT DETECTED
Tetrahydrocannabinol: POSITIVE — AB

## 2019-07-05 LAB — ETHANOL: Alcohol, Ethyl (B): <10 mg/dL (ref <10)

## 2019-07-05 LAB — CBG MONITORING, ED: Glucose-Capillary: 91 mg/dL (ref 70–99)

## 2019-07-05 LAB — SARS CORONAVIRUS 2 BY RT PCR (HOSPITAL ORDER, PERFORMED IN ~~LOC~~ HOSPITAL LAB): SARS Coronavirus 2: NEGATIVE

## 2019-07-05 LAB — POTASSIUM: Potassium: 3.3 mmol/L — ABNORMAL LOW (ref 3.5–5.1)

## 2019-07-05 LAB — FERRITIN: Ferritin: 6 ng/mL — ABNORMAL LOW (ref 11–307)

## 2019-07-05 LAB — MAGNESIUM: Magnesium: 1.9 mg/dL (ref 1.7–2.4)

## 2019-07-05 MED ORDER — ENOXAPARIN SODIUM 40 MG/0.4ML ~~LOC~~ SOLN
40.0000 mg | SUBCUTANEOUS | Status: DC
  Administered 2019-07-05: 40 mg via SUBCUTANEOUS
  Filled 2019-07-05: qty 0.4

## 2019-07-05 MED ORDER — ALBUTEROL SULFATE (2.5 MG/3ML) 0.083% IN NEBU: 2.5000 mg | INHALATION_SOLUTION | Freq: Four times a day (QID) | RESPIRATORY_TRACT | Status: DC | PRN

## 2019-07-05 MED ORDER — PHENYTOIN SODIUM 50 MG/ML IJ SOLN
100.0000 mg | Freq: Three times a day (TID) | INTRAMUSCULAR | Status: DC
  Administered 2019-07-05: 100 mg via INTRAVENOUS
  Filled 2019-07-05 (×3): qty 2

## 2019-07-05 MED ORDER — POTASSIUM CHLORIDE 10 MEQ/100ML IV SOLN
10.0000 meq | INTRAVENOUS | Status: AC
  Administered 2019-07-05 (×5): 10 meq via INTRAVENOUS
  Filled 2019-07-05 (×5): qty 100

## 2019-07-05 MED ORDER — LORAZEPAM 2 MG/ML IJ SOLN
INTRAMUSCULAR | Status: AC
  Administered 2019-07-05: 2 mg
  Filled 2019-07-05: qty 1

## 2019-07-05 MED ORDER — SODIUM CHLORIDE 0.9 % IV SOLN
75.0000 mL/h | INTRAVENOUS | Status: DC
  Administered 2019-07-05 (×2): 75 mL/h via INTRAVENOUS

## 2019-07-05 MED ORDER — SODIUM CHLORIDE 0.9 % IV SOLN
20.0000 mg/kg | Freq: Once | INTRAVENOUS | Status: AC
  Administered 2019-07-05: 1360 mg via INTRAVENOUS
  Filled 2019-07-05: qty 27.2

## 2019-07-05 MED ORDER — VALPROATE SODIUM 500 MG/5ML IV SOLN
1500.0000 mg | Freq: Once | INTRAVENOUS | Status: DC
  Filled 2019-07-05: qty 15

## 2019-07-05 MED ORDER — POTASSIUM CHLORIDE CRYS ER 20 MEQ PO TBCR: 40.0000 meq | EXTENDED_RELEASE_TABLET | Freq: Once | ORAL | Status: DC

## 2019-07-05 MED ORDER — LEVETIRACETAM IN NACL 1000 MG/100ML IV SOLN
1000.0000 mg | Freq: Two times a day (BID) | INTRAVENOUS | Status: DC
  Administered 2019-07-05: 1000 mg via INTRAVENOUS
  Filled 2019-07-05 (×2): qty 100

## 2019-07-05 MED ORDER — LORAZEPAM 2 MG/ML IJ SOLN: 2.0000 mg | Freq: Once | INTRAMUSCULAR | Status: AC

## 2019-07-05 MED ORDER — VALPROIC ACID 250 MG PO CAPS: 500.0000 mg | ORAL_CAPSULE | Freq: Three times a day (TID) | ORAL | Status: DC

## 2019-07-05 NOTE — H&P (Signed)
History and Physical    Jasmine [name] GHW:[id number] DOB: November 27, 1990 DOA: 07/04/2019  PCP: Clent Demark, PA-C Patient coming from: Home  Chief Complaint: Seizures  HPI: Jasmine [name] is a 29 y.o. female with medical history significant of epilepsy, asthma, GERD, sickle cell trait presenting to the ED via EMS after having 3 tonic-clonic seizures at home which were witnessed by her husband.  At the time EMS arrived she was not having any more seizures but appeared to be postictal.  Husband reported that the patient was recently diagnosed with a brain tumor but he was unsure.    Afebrile on arrival to the ED.  Labs showing mild leukocytosis (WBC 14.7).  Potassium 2.7.  EKG with borderline U waves.  UA pending.  UDS pending.  Beta hCG negative.  Head CT negative for acute intracranial abnormality.  ED provider discussed the case with neurology who recommended giving Keppra load of 2000 mg as well as oral Topamax.  Her seizures were felt to be due to medication noncompliance.  Recommended checking blood ethanol and magnesium levels.  In addition, recommended obtaining brain MRI to ensure there is no brain tumor.  At MRI, patient had another seizure.  She was postictal afterwards and no history could be obtained from her.  Protecting her airway.  I spoke to Dr. Cheral Marker who recommended giving Ativan 2 mg.  He ordered fosphenytoin and Dilantin.  He will see the patient and start continuous EEG.  Review of Systems:  All systems reviewed and apart from history of presenting illness, are negative.  Past Medical History:  Diagnosis Date  . Asthma   . Complication of anesthesia    "I wake up during anesthesia" (10/14/2015)  . DVT (deep vein thrombosis) in pregnancy   . Epilepsy ([place])   . GERD (gastroesophageal reflux disease)   . Heart murmur    last work up age 29- no symtoms  . Pancreatitis   . Sickle cell trait ([place])   . Sleep apnea    "used to wear CPAP; don't have one here in [place] since I  moved in 2014" (10/14/2015)    Past Surgical History:  Procedure Laterality Date  . APPENDECTOMY    . CESAREAN SECTION  2013  . CESAREAN SECTION N/A 11/08/2014   Procedure: CESAREAN SECTION;  Surgeon: Truett Mainland, DO;  Location: [facility];  Service: Obstetrics;  Laterality: N/A;  . CHOLECYSTECTOMY N/A 10/16/2015   Procedure: LAPAROSCOPIC CHOLECYSTECTOMY WITH POSSIBLE INTRAOPERATIVE CHOLANGIOGRAM;  Surgeon: Donnie Mesa, MD;  Location: [facility];  Service: General;  Laterality: N/A;  . CLOSED REDUCTION MANDIBLE WITH MANDIBULOMA N/A 07/17/2017   Procedure: REDUCTION TMJ WITH INTRAMAXILLARY FIXATION;  Surgeon: Diona Browner, DDS;  Location: [facility];  Service: Oral Surgery;  Laterality: N/A;  . ESOPHAGOGASTRODUODENOSCOPY (EGD) WITH PROPOFOL N/A 11/15/2016   Procedure: ESOPHAGOGASTRODUODENOSCOPY (EGD) WITH PROPOFOL;  Surgeon: Clarene Essex, MD;  Location: [facility];  Service: Endoscopy;  Laterality: N/A;  . INGUINAL HERNIA REPAIR Bilateral ~ 1996  . NERVE, TENDON AND ARTERY REPAIR Right 09/23/2012   Procedure: I&D and Repair As Necessary/Right Hand and Palm;  Surgeon: Roseanne Kaufman, MD;  Location: [facility];  Service: Orthopedics;  Laterality: Right;  . OMENTECTOMY N/A 12/25/2018   Procedure: OMENTECTOMY;  Surgeon: Lafonda Mosses, MD;  Location: [facility];  Service: Gynecology;  Laterality: N/A;  . SALPINGOOPHORECTOMY N/A 12/25/2018   Procedure: OPEN UNILATERAL  SALPINGO OOPHORECTOMY WITH STAGING;  Surgeon: Lafonda Mosses, MD;  Location: [facility];  Service: Gynecology;  Laterality:  N/A;  . TONSILLECTOMY       reports that she quit smoking about 3 years ago. Her smoking use included cigarettes. She has a 1.00 pack-year smoking history. She has never used smokeless tobacco. She reports that she does not drink alcohol and does not use drugs.  Allergies  Allergen Reactions  . Cheese Shortness Of Breath  . Chocolate Shortness Of Breath  . Ibuprofen Hives and Shortness Of Breath    Children's ibuprofen    . Ivp Dye [Iodinated Diagnostic Agents] Hives, Shortness Of Breath and Itching  . Latex Anaphylaxis, Swelling and Other (See Comments)    Reaction:  Localized swelling   . Orange Juice [Orange Oil] Shortness Of Breath  . Other Hives and Other (See Comments)    Pt states that she is allergic to all steroids except IV solu-medrol.    Marland Kitchen Peach [Prunus Persica] Anaphylaxis  . Peanuts [Peanut Oil] Anaphylaxis  . Pear Anaphylaxis  . Prednisone Hives  . Raspberry Anaphylaxis  . Tylenol [Acetaminophen] Hives, Shortness Of Breath and Other (See Comments)    Pt states that this is only with the liquid form  . Amoxicillin Hives and Other (See Comments)    Has patient had a PCN reaction causing immediate rash, facial/tongue/throat swelling, SOB or lightheadedness with hypotension: No Has patient had a PCN reaction causing severe rash involving mucus membranes or skin necrosis: No Has patient had a PCN reaction that required hospitalization: No Has patient had a PCN reaction occurring within the last 10 years: No If all of the above answers are "NO", then may proceed with Cephalosporin use.  Marland Kitchen Apricot Flavor Hives  . Doxycycline Hives  . Erythromycin Hives  . Milk Of Magnesia [Magnesium Hydroxide] Hives and Itching  . Penicillins Hives and Other (See Comments)    Has patient had a PCN reaction causing immediate rash, facial/tongue/throat swelling, SOB or lightheadedness with hypotension: No Has patient had a PCN reaction causing severe rash involving mucus membranes or skin necrosis: No Has patient had a PCN reaction that required hospitalization: No Has patient had a PCN reaction occurring within the last 10 years: No If all of the above answers are "NO", then may proceed with Cephalosporin use.    Family History  Problem Relation Age of Onset  . Cancer Mother        cervical cancer  . Asthma Mother   . Hypertension Father   . Sickle cell anemia Father   . Asthma Sister   . Diabetes  Maternal Aunt   . Lymphoma Maternal Grandmother     Prior to Admission medications   Medication Sig Start Date End Date Taking? Authorizing Provider  levETIRAcetam (KEPPRA) 1000 MG tablet Take 1 tablet (1,000 mg total) by mouth 2 (two) times daily. 07/04/19  Yes Malvin Johns, MD  albuterol (PROVENTIL HFA;VENTOLIN HFA) 108 (90 Base) MCG/ACT inhaler Inhale 2 puffs into the lungs every 4 (four) hours as needed for wheezing or shortness of breath. Patient not taking: Reported on 07/04/2019 08/20/17   Clent Demark, PA-C  mometasone-formoterol [facility]) 200-5 MCG/ACT AERO Inhale 2 puffs into the lungs 2 (two) times daily. Patient not taking: Reported on 07/04/2019    [provider]  SUMAtriptan (IMITREX) 50 MG tablet Take 1 tablet (50 mg total) by mouth daily. May take one more tablet two hours after the first. No more than two tablets per day. Patient not taking: Reported on 03/28/2019 01/06/18   Gildardo Pounds, NP  topiramate (TOPAMAX) 50 MG  tablet Take 1 tablet (50 mg total) by mouth at bedtime. Patient not taking: Reported on 03/28/2019 01/06/18 03/25/19  Gildardo Pounds, NP  cetirizine (ZYRTEC) 10 MG tablet Take 1 tablet (10 mg total) by mouth daily. Patient not taking: Reported on 03/28/2019 12/11/17 05/30/19  Ok Edwards, PA-C    Physical Exam: Vitals:   07/05/19 0023 07/05/19 0024 07/05/19 0025 07/05/19 0026  BP:      Pulse:      Resp: (!) 21 (!) 22 19 (!) 22  Temp:      TempSrc:      SpO2:      Weight:      Height:        Physical Exam Constitutional:      Appearance: She is not diaphoretic.  HENT:     Head: Normocephalic.  Eyes:     Pupils: Pupils are equal, round, and reactive to light.  Cardiovascular:     Rate and Rhythm: Normal rate and regular rhythm.     Pulses: Normal pulses.  Pulmonary:     Effort: Pulmonary effort is normal. No respiratory distress.     Breath sounds: Normal breath sounds. No wheezing or rales.  Abdominal:     General: Bowel sounds  are normal. There is no distension.     Palpations: Abdomen is soft.     Tenderness: There is no abdominal tenderness. There is no guarding.  Musculoskeletal:        General: No swelling.     Cervical back: Neck supple.  Skin:    General: Skin is warm and dry.  Neurological:     Comments: Somnolent, nonverbal     Labs on Admission: I have personally reviewed following labs and imaging studies  CBC: Recent Labs  Lab 07/04/19 1052 07/04/19 2146  WBC 17.1* 14.7*  NEUTROABS 14.8* 10.9*  HGB 10.8* 8.9*  HCT 35.2* 29.8*  MCV 76.0* 75.8*  PLT 337 038   Basic Metabolic Panel: Recent Labs  Lab 07/04/19 1052 07/04/19 2146  NA 139 136  K 3.4* 2.7*  CL 106 102  CO2 26 23  GLUCOSE 84 83  BUN 9 6  CREATININE 0.66 0.58  CALCIUM 8.7* 8.9   GFR: Estimated Creatinine Clearance: 94.2 mL/min (by C-G formula based on SCr of 0.58 mg/dL). Liver Function Tests: Recent Labs  Lab 07/04/19 2146  AST 25  ALT 19  ALKPHOS 49  BILITOT 0.7  PROT 6.5  ALBUMIN 3.8   No results for input(s): LIPASE, AMYLASE in the last 168 hours. No results for input(s): AMMONIA in the last 168 hours. Coagulation Profile: No results for input(s): INR, PROTIME in the last 168 hours. Cardiac Enzymes: No results for input(s): CKTOTAL, CKMB, CKMBINDEX, TROPONINI in the last 168 hours. BNP (last 3 results) No results for input(s): PROBNP in the last 8760 hours. HbA1C: No results for input(s): HGBA1C in the last 72 hours. CBG: Recent Labs  Lab 07/04/19 1049 07/04/19 2220  GLUCAP 103* 79   Lipid Profile: No results for input(s): CHOL, HDL, LDLCALC, TRIG, CHOLHDL, LDLDIRECT in the last 72 hours. Thyroid Function Tests: No results for input(s): TSH, T4TOTAL, FREET4, T3FREE, THYROIDAB in the last 72 hours. Anemia Panel: No results for input(s): VITAMINB12, FOLATE, FERRITIN, TIBC, IRON, RETICCTPCT in the last 72 hours. Urine analysis:    Component Value Date/Time   COLORURINE YELLOW 05/13/2019 [place] 05/13/2019 1507   LABSPEC 1.020 05/13/2019 1507   PHURINE 5.0 05/13/2019 1507  GLUCOSEU NEGATIVE 05/13/2019 [place] 05/13/2019 1507   [place] 05/13/2019 1507   KETONESUR NEGATIVE 05/13/2019 1507   PROTEINUR 30 (A) 05/13/2019 1507   UROBILINOGEN 1.0 11/04/2014 0946   NITRITE NEGATIVE 05/13/2019 1507   LEUKOCYTESUR NEGATIVE 05/13/2019 1507    Radiological Exams on Admission: CT Head Wo Contrast  Result Date: 07/04/2019 CLINICAL DATA:  Seizures EXAM: CT HEAD WITHOUT CONTRAST TECHNIQUE: Contiguous axial images were obtained from the base of the skull through the vertex without intravenous contrast. COMPARISON:  March 28, 2019 FINDINGS: Brain: No evidence of acute territorial infarction, hemorrhage, hydrocephalus,extra-axial collection or mass lesion/mass effect. Normal gray-white differentiation. Ventricles are normal in size and contour. Vascular: No hyperdense vessel or unexpected calcification. Skull: The skull is intact. No fracture or focal lesion identified. Sinuses/Orbits: The visualized paranasal sinuses and mastoid air cells are clear. The orbits and globes intact. Other: None Cervical spine: Alignment: There is straightening of the normal cervical lordosis. Skull base and vertebrae: Visualized skull base is intact. No atlanto-occipital dissociation. The vertebral body heights are well maintained. No fracture or pathologic osseous lesion seen. Soft tissues and spinal canal: The visualized paraspinal soft tissues are unremarkable. No prevertebral soft tissue swelling is seen. The spinal canal is grossly unremarkable, no large epidural collection or significant canal narrowing. Disc levels: No significant canal neural foraminal narrowing is seen. Upper chest: Subpleural blebs and biapical centrilobular emphysematous changes are seen. Thoracic inlet is within normal limits. Other: None IMPRESSION: No acute intracranial abnormality. No acute fracture  or malalignment of the spine. Emphysema (ICD10-J43.9). Electronically Signed   By: Prudencio Pair M.D.   On: 07/04/2019 22:18   CT Cervical Spine Wo Contrast  Result Date: 07/04/2019 CLINICAL DATA:  Seizures EXAM: CT HEAD WITHOUT CONTRAST TECHNIQUE: Contiguous axial images were obtained from the base of the skull through the vertex without intravenous contrast. COMPARISON:  March 28, 2019 FINDINGS: Brain: No evidence of acute territorial infarction, hemorrhage, hydrocephalus,extra-axial collection or mass lesion/mass effect. Normal gray-white differentiation. Ventricles are normal in size and contour. Vascular: No hyperdense vessel or unexpected calcification. Skull: The skull is intact. No fracture or focal lesion identified. Sinuses/Orbits: The visualized paranasal sinuses and mastoid air cells are clear. The orbits and globes intact. Other: None Cervical spine: Alignment: There is straightening of the normal cervical lordosis. Skull base and vertebrae: Visualized skull base is intact. No atlanto-occipital dissociation. The vertebral body heights are well maintained. No fracture or pathologic osseous lesion seen. Soft tissues and spinal canal: The visualized paraspinal soft tissues are unremarkable. No prevertebral soft tissue swelling is seen. The spinal canal is grossly unremarkable, no large epidural collection or significant canal narrowing. Disc levels: No significant canal neural foraminal narrowing is seen. Upper chest: Subpleural blebs and biapical centrilobular emphysematous changes are seen. Thoracic inlet is within normal limits. Other: None IMPRESSION: No acute intracranial abnormality. No acute fracture or malalignment of the spine. Emphysema (ICD10-J43.9). Electronically Signed   By: Prudencio Pair M.D.   On: 07/04/2019 22:18    EKG: Independently reviewed.  Sinus rhythm.  Borderline U waves.  Assessment/Plan Principal Problem:   Seizures ([place]) Active Problems:   Microcytic anemia    Leukocytosis   Hypokalemia   Asthma   Seizures: Patient had 3 tonic-clonic seizures at home.  She was postictal on arrival to the ED.  Head CT negative for acute intracranial abnormality.  Subsequently had another seizure in the ED while being transferred to MRI despite receiving loading dose of 2000 mg  Keppra as well as oral Topamax.  Currently postictal but able to protect her airway. -Neurology will see the patient.  Ativan 2 mg given.  Neurology has ordered fosphenytoin, phenytoin, and Keppra maintenance dose.  Plan is to start continuous EEG.  Brain MRI pending.  Blood ethanol level and magnesium level pending.  Frequent neurochecks, seizure precautions.  Continuous pulse ox, supplemental oxygen as needed.  Hypokalemia: Potassium 2.7.  EKG with borderline U waves. -Replete potassium.  Check magnesium level and replete if low.  Continue to monitor electrolytes.  Mild leukocytosis: Likely reactive in the setting of seizures. -Repeat CBC in a.m.  Chronic microcytic anemia: Hemoglobin 8.9, baseline 9-10 range.  MCV 75.  Patient is currently postictal.  Once her mental status improves, please obtain history additional history -if she having menorrhagia? -Check iron, ferritin, TIBC  Asthma: Lungs clear on exam.  No wheezing. -Albuterol as needed  DVT prophylaxis: Lovenox Code Status: Full code Family Communication: No family available at this time. Disposition Plan: Status is: Inpatient  Remains inpatient appropriate because:Altered mental status, Ongoing diagnostic testing needed not appropriate for outpatient work up, Unsafe d/c plan, IV treatments appropriate due to intensity of illness or inability to take PO and Inpatient level of care appropriate due to severity of illness   Dispo: The patient is from: Home              Anticipated d/c is to: Home              Anticipated d/c date is: 3 days              Patient currently is not medically stable to d/c.   The medical decision  making on this patient was of high complexity and the patient is at high risk for clinical deterioration, therefore this is a level 3 visit.  Shela Leff MD [facility]  If 7PM-7AM, please contact night-coverage [email or web address]  07/05/2019, 2:25 AM

## 2019-07-05 NOTE — Progress Notes (Addendum)
Patient seen and admitted by Dr. Marlowe Sax earlier this morning please see her note for detailed H&P.    29 year old lady with prior history of epilepsy, asthma, GERD, sickle cell trait presents to ED after recurrence at home witnessed by her husband.  Neurology consulted and she was started on dilantin.  She was started on Keppra 1000 mg twice daily.  MRI of the brain ordered for further evaluation.    Hosie Poisson MD

## 2019-07-05 NOTE — ED Notes (Signed)
Neurology paged to RN per her request

## 2019-07-05 NOTE — Progress Notes (Signed)
Pt signed herself out AMA after working with therapy. Pt states she wants to go home and see her children before they go away for the summer. MD aware and spoke w/ patient over the phone and states she will call and speak w/ her husband. Pt transported to the [facility] entrance via wheelchair.

## 2019-07-05 NOTE — ED Notes (Signed)
pts sister at  The bedside

## 2019-07-05 NOTE — Progress Notes (Signed)
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 Multiple breakthrough seizures and presented to the ER.  Subjective No further seizures this am, but remains somnolent. Dilantin was added due to further seizure after Keppra IV load.   Past Medical History Past Medical History:  Diagnosis Date  . Asthma   . Complication of anesthesia    "I wake up during anesthesia" (10/14/2015)  . DVT (deep vein thrombosis) in pregnancy   . Epilepsy ([place])   . GERD (gastroesophageal reflux disease)   . Heart murmur    last work up age 86- no symtoms  . Pancreatitis   . Sickle cell trait ([place])   . Sleep apnea    "used to wear CPAP; don't have one here in [place] since I moved in 2014" (10/14/2015)    Past Surgical History Past Surgical History:  Procedure Laterality Date  . APPENDECTOMY    . CESAREAN SECTION  2013  . CESAREAN SECTION N/A 11/08/2014   Procedure: CESAREAN SECTION;  Surgeon: Truett Mainland, DO;  Location: [facility];  Service: Obstetrics;  Laterality: N/A;  . CHOLECYSTECTOMY N/A 10/16/2015   Procedure: LAPAROSCOPIC CHOLECYSTECTOMY WITH POSSIBLE INTRAOPERATIVE CHOLANGIOGRAM;  Surgeon: Donnie Mesa, MD;  Location: [facility];  Service: General;  Laterality: N/A;  . CLOSED REDUCTION MANDIBLE WITH MANDIBULOMA N/A 07/17/2017   Procedure: REDUCTION TMJ WITH INTRAMAXILLARY FIXATION;  Surgeon: Diona Browner, DDS;  Location: [facility];  Service: Oral Surgery;  Laterality: N/A;  . ESOPHAGOGASTRODUODENOSCOPY (EGD) WITH PROPOFOL N/A 11/15/2016   Procedure: ESOPHAGOGASTRODUODENOSCOPY (EGD) WITH PROPOFOL;  Surgeon: Clarene Essex, MD;  Location: [facility];  Service: Endoscopy;  Laterality: N/A;  . INGUINAL HERNIA REPAIR Bilateral ~ 1996  . NERVE, TENDON AND ARTERY REPAIR Right 09/23/2012   Procedure: I&D and Repair As Necessary/Right Hand and Palm;  Surgeon: Roseanne Kaufman, MD;  Location: [facility];  Service: Orthopedics;  Laterality: Right;  . OMENTECTOMY N/A 12/25/2018   Procedure: OMENTECTOMY;   Surgeon: Lafonda Mosses, MD;  Location: [facility];  Service: Gynecology;  Laterality: N/A;  . SALPINGOOPHORECTOMY N/A 12/25/2018   Procedure: OPEN UNILATERAL  SALPINGO OOPHORECTOMY WITH STAGING;  Surgeon: Lafonda Mosses, MD;  Location: [facility];  Service: Gynecology;  Laterality: N/A;  . TONSILLECTOMY      Allergies Allergies  Allergen Reactions  . Cheese Shortness Of Breath  . Chocolate Shortness Of Breath  . Ibuprofen Hives and Shortness Of Breath    Children's ibuprofen  . Ivp Dye [Iodinated Diagnostic Agents] Hives, Shortness Of Breath and Itching  . Latex Anaphylaxis, Swelling and Other (See Comments)    Reaction:  Localized swelling   . Orange Juice [Orange Oil] Shortness Of Breath  . Other Hives and Other (See Comments)    Pt states that she is allergic to all steroids except IV solu-medrol.    Marland Kitchen Peach [Prunus Persica] Anaphylaxis  . Peanuts [Peanut Oil] Anaphylaxis  . Pear Anaphylaxis  . Prednisone Hives  . Raspberry Anaphylaxis  . Tylenol [Acetaminophen] Hives, Shortness Of Breath and Other (See Comments)    Pt states that this is only with the liquid form  . Amoxicillin Hives and Other (See Comments)    Has patient had a PCN reaction causing immediate rash, facial/tongue/throat swelling, SOB or lightheadedness with hypotension: No Has patient had a PCN reaction causing severe rash involving mucus membranes or skin necrosis: No Has patient had a PCN reaction that required hospitalization: No Has patient had a PCN reaction occurring within the last 10 years: No If all  of the above answers are "NO", then may proceed with Cephalosporin use.  Marland Kitchen Apricot Flavor Hives  . Doxycycline Hives  . Erythromycin Hives  . Milk Of Magnesia [Magnesium Hydroxide] Hives and Itching  . Penicillins Hives and Other (See Comments)    Has patient had a PCN reaction causing immediate rash, facial/tongue/throat swelling, SOB or lightheadedness with hypotension: No Has patient had a PCN  reaction causing severe rash involving mucus membranes or skin necrosis: No Has patient had a PCN reaction that required hospitalization: No Has patient had a PCN reaction occurring within the last 10 years: No If all of the above answers are "NO", then may proceed with Cephalosporin use.    Home Medications Medications Prior to Admission  Medication Sig Dispense Refill  . levETIRAcetam (KEPPRA) 1000 MG tablet Take 1 tablet (1,000 mg total) by mouth 2 (two) times daily. 60 tablet 0  . albuterol (PROVENTIL HFA;VENTOLIN HFA) 108 (90 Base) MCG/ACT inhaler Inhale 2 puffs into the lungs every 4 (four) hours as needed for wheezing or shortness of breath. (Patient not taking: Reported on 07/04/2019) 1 Inhaler 3  . mometasone-formoterol (DULERA) 200-5 MCG/ACT AERO Inhale 2 puffs into the lungs 2 (two) times daily. (Patient not taking: Reported on 07/04/2019)    . SUMAtriptan (IMITREX) 50 MG tablet Take 1 tablet (50 mg total) by mouth daily. May take one more tablet two hours after the first. No more than two tablets per day. (Patient not taking: Reported on 03/28/2019) 20 tablet 1  . topiramate (TOPAMAX) 50 MG tablet Take 1 tablet (50 mg total) by mouth at bedtime. (Patient not taking: Reported on 03/28/2019) 30 tablet 3    Hospital Medications . enoxaparin (LOVENOX) injection  40 mg Subcutaneous Q24H  . phenytoin (DILANTIN) IV  100 mg Intravenous Q8H     Objective  Intake/Output from previous day: No intake/output data recorded. Intake/Output this shift: No intake/output data recorded. Nutritional status:  Diet Order    None       Physical Exam -  Vitals:   07/05/19 0408 07/05/19 0409 07/05/19 0504 07/05/19 0741  BP:   95/63 115/66  Pulse:   90   Resp: 19 19 19    Temp:   99 F (37.2 C) 98.5 F (36.9 C)  TempSrc:   Oral   SpO2:   97%   Weight:   72.9 kg   Height:   5\' 4"  (1.626 m)    General - no acute distress, somnolent Heart - Regular rate and rhythm - no murmer Lungs -  Clear to auscultation Abdomen - Soft - non tender Extremities - Distal pulses intact - no edema Skin - Warm and dry  Neurologic Exam:   Mental Status:  Somnolent, but with repeat tactile/verbal stimuli, she alerts, orients, but got date wrong. Otherwise, thought content appropriate. Speech without evidence of dysarthria or aphasia. Able to follow 3 step commands without difficulty.  Cranial Nerves:  II-bilateral visual fields intact III/IV/VI-Pupils were equal and reacted. Extraocular movements were full.  V/VII-no facial numbness and no facial weakness.  VIII-hearing normal.  X-normal speech and symmetrical palatal movement.  XII-midline tongue extension  Motor: Right : Upper extremity   5/5    Left:     Upper extremity   5/5  Lower extremity   5/5     Lower extremity   5/5 Tone and bulk:normal tone throughout; no atrophy noted Sensory: Intact to light touch in all extremities. Deep Tendon Reflexes: 2/4 throughout Plantars: Downgoing bilaterally  Cerebellar: Normal finger to nose and heel to shin bilaterally. Gait: not tested    LABORATORY RESULTS:  Basic Metabolic Panel: Recent Labs  Lab 07/04/19 1052 07/04/19 2146 07/05/19 0353  NA 139 136 138  K 3.4* 2.7* 2.7*  CL 106 102 109  CO2 26 23 23   GLUCOSE 84 83 94  BUN 9 6 <5*  CREATININE 0.66 0.58 0.59  CALCIUM 8.7* 8.9 7.9*  MG  --   --  1.9    Liver Function Tests: Recent Labs  Lab 07/04/19 2146  AST 25  ALT 19  ALKPHOS 49  BILITOT 0.7  PROT 6.5  ALBUMIN 3.8   No results for input(s): LIPASE, AMYLASE in the last 168 hours. No results for input(s): AMMONIA in the last 168 hours.  CBC: Recent Labs  Lab 07/04/19 1052 07/04/19 2146 07/05/19 0353  WBC 17.1* 14.7* 11.3*  NEUTROABS 14.8* 10.9*  --   HGB 10.8* 8.9* 7.9*  HCT 35.2* 29.8* 26.2*  MCV 76.0* 75.8* 75.7*  PLT 337 313 265    Cardiac Enzymes: No results for input(s): CKTOTAL, CKMB, CKMBINDEX, TROPONINI in the last 168 hours.  Lipid  Panel: No results for input(s): CHOL, TRIG, HDL, CHOLHDL, VLDL, LDLCALC in the last 168 hours.  CBG: Recent Labs  Lab 07/04/19 1049 07/04/19 2220 07/05/19 0421  GLUCAP 103* 79 91    Microbiology:   Coagulation Studies: No results for input(s): LABPROT, INR in the last 72 hours.  Miscellaneous Labs:   IMAGING RESULTS EEG  Result Date: 07/05/2019 Lora Havens, MD     07/05/2019  9:05 AM Patient Name: Jasmine [name] MRN: [id number] Epilepsy Attending: Lora Havens Referring Physician/Provider: Dr Derrick Ravel Date: 07/05/2019 Duration: 22.29 mins Patient history: 29 year old female with epilepsy, re-presenting with recurrent seizures in the context of noncompliance with her Keppra. EEG to evaluate for seizure Level of alertness: Asleep AEDs during EEG study: LEV, Dilantin Technical aspects: This EEG study was done with scalp electrodes positioned according to the 10-20 International system of electrode placement. Electrical activity was acquired at a sampling rate of 500Hz  and reviewed with a high frequency filter of 70Hz  and a low frequency filter of 1Hz . EEG data were recorded continuously and digitally stored. Description: The posterior dominant rhythm consists of 8 Hz activity of moderate voltage (25-35 uV) seen predominantly in posterior head regions, symmetric and reactive to eye opening and eye closing. Sleep was characterized by vertex waves, sleep spindles (12 to 14 Hz), maximal frontocentral region.  Hyperventilation and photic stimulation were not performed.   IMPRESSION: This study is within normal limits. No seizures or epileptiform discharges were seen throughout the recording. Lora Havens   CT Head Wo Contrast  Result Date: 07/04/2019 CLINICAL DATA:  Seizures EXAM: CT HEAD WITHOUT CONTRAST TECHNIQUE: Contiguous axial images were obtained from the base of the skull through the vertex without intravenous contrast. COMPARISON:  March 28, 2019 FINDINGS: Brain: No  evidence of acute territorial infarction, hemorrhage, hydrocephalus,extra-axial collection or mass lesion/mass effect. Normal gray-white differentiation. Ventricles are normal in size and contour. Vascular: No hyperdense vessel or unexpected calcification. Skull: The skull is intact. No fracture or focal lesion identified. Sinuses/Orbits: The visualized paranasal sinuses and mastoid air cells are clear. The orbits and globes intact. Other: None Cervical spine: Alignment: There is straightening of the normal cervical lordosis. Skull base and vertebrae: Visualized skull base is intact. No atlanto-occipital dissociation. The vertebral body heights are well maintained. No fracture or pathologic osseous lesion  seen. Soft tissues and spinal canal: The visualized paraspinal soft tissues are unremarkable. No prevertebral soft tissue swelling is seen. The spinal canal is grossly unremarkable, no large epidural collection or significant canal narrowing. Disc levels: No significant canal neural foraminal narrowing is seen. Upper chest: Subpleural blebs and biapical centrilobular emphysematous changes are seen. Thoracic inlet is within normal limits. Other: None IMPRESSION: No acute intracranial abnormality. No acute fracture or malalignment of the spine. Emphysema (ICD10-J43.9). Electronically Signed   By: Prudencio Pair M.D.   On: 07/04/2019 22:18   CT Cervical Spine Wo Contrast  Result Date: 07/04/2019 CLINICAL DATA:  Seizures EXAM: CT HEAD WITHOUT CONTRAST TECHNIQUE: Contiguous axial images were obtained from the base of the skull through the vertex without intravenous contrast. COMPARISON:  March 28, 2019 FINDINGS: Brain: No evidence of acute territorial infarction, hemorrhage, hydrocephalus,extra-axial collection or mass lesion/mass effect. Normal gray-white differentiation. Ventricles are normal in size and contour. Vascular: No hyperdense vessel or unexpected calcification. Skull: The skull is intact. No fracture or  focal lesion identified. Sinuses/Orbits: The visualized paranasal sinuses and mastoid air cells are clear. The orbits and globes intact. Other: None Cervical spine: Alignment: There is straightening of the normal cervical lordosis. Skull base and vertebrae: Visualized skull base is intact. No atlanto-occipital dissociation. The vertebral body heights are well maintained. No fracture or pathologic osseous lesion seen. Soft tissues and spinal canal: The visualized paraspinal soft tissues are unremarkable. No prevertebral soft tissue swelling is seen. The spinal canal is grossly unremarkable, no large epidural collection or significant canal narrowing. Disc levels: No significant canal neural foraminal narrowing is seen. Upper chest: Subpleural blebs and biapical centrilobular emphysematous changes are seen. Thoracic inlet is within normal limits. Other: None IMPRESSION: No acute intracranial abnormality. No acute fracture or malalignment of the spine. Emphysema (ICD10-J43.9). Electronically Signed   By: Prudencio Pair M.D.   On: 07/04/2019 22:18     EEG: IMPRESSION: This study is within normal limits. No seizures or epileptiform discharges were seen throughout the recording.  Assessment/Plan: 29 year old female with epilepsy, re-presenting with recurrent seizures in the context of noncompliance with her Keppra  1. Epilepsy with breakthrough seizures- Was loaded with an additional 2000 mg IV Keppra in the ED. Despite this, recurrent seizure recurred. Dilantin 100mg  Q8 added  -CT head shows no acute intracranial abnormality 2. Encephalopathy- postictal state and medication effect likely contributing.   Recommendations: Seizure precautions Continue IV Keppra and Dilantin for now given she is too encephalopathic to reliably take PO meds at this time Ativan 2 mg IV ordered PRN recurrent seizure activity. MRI brain when stable.  When she is no longer postictal, the patient will need to be informed of  outpatient seizure precautions: Per [facility] statutes, patients with seizures are not allowed to drive until  they have been seizure-free for six months. Use caution when using heavy equipment or power tools. Avoid working on ladders or at heights. Take showers instead of baths. Ensure the water temperature is not too high on the home water heater. Do not go swimming alone. When caring for infants or small children, sit down when holding, feeding, or changing them to minimize risk of injury to the child in the event you have a seizure. Also, Maintain good sleep hygiene. Avoid alcohol.  Lanitra Battaglini Metzger-Cihelka, ARNP-C, ANVP-BC Pager: [phone]

## 2019-07-05 NOTE — ED Provider Notes (Signed)
Patient had another generalized seizure, currently postictal.   Delora Fuel, MD 70/76/15 [phone]

## 2019-07-05 NOTE — ED Notes (Signed)
Unable to wake her up past her eyes opening  She has had a lot of meds

## 2019-07-05 NOTE — Consult Note (Signed)
NEURO HOSPITALIST CONSULT NOTE   Requestig physician: Dr. Marlowe Sax  Reason for Consult: Breakthrough seizures in the context of medication noncompliance  History obtained from:  Chart    HPI:                                                                                                                                          Jasmine [name] is an 29 y.o. female with epilepsy, sickle cell trait and sleep apnea who initially presented to the ED via EMS on Saturday morning after husband had called for 3 seizures since 8:00 AM. Her Keppra had recently been increased from 500 mg BID to 1000 mg BID. EMS noted her to have been incontinent. She then had 2 more seizures. The patient stated to EDP that she had taken her last dose of Keppra on Friday night, finishing the prescription - she had no refill available so she missed her Saturday AM dose. She was postictal on arrival to the ED. It was noted that she has had frequent ED visits and has a history of prior noncompliance with her Keppra. While in the ED she returned back to baseline without further seizures. She was not administered IV Keppra in the ED, but was discharged home with a prescription for 1000 mg BID. She then had another seizure and re-presented to the ED. There is mention of a possible brain tumor diagnosis in one note from her ED visit, but there is no record of such in Epic and no mass is seen on CT head.   CT of head and cerivcal spine: -- No acute intracranial abnormality. -- No acute fracture or malalignment of the spine.  Past Medical History:  Diagnosis Date  . Asthma   . Complication of anesthesia    "I wake up during anesthesia" (10/14/2015)  . DVT (deep vein thrombosis) in pregnancy   . Epilepsy ([place])   . GERD (gastroesophageal reflux disease)   . Heart murmur    last work up age 7- no symtoms  . Pancreatitis   . Sickle cell trait ([place])   . Sleep apnea    "used to wear CPAP; don't have one here in  [place] since I moved in 2014" (10/14/2015)    Past Surgical History:  Procedure Laterality Date  . APPENDECTOMY    . CESAREAN SECTION  2013  . CESAREAN SECTION N/A 11/08/2014   Procedure: CESAREAN SECTION;  Surgeon: Truett Mainland, DO;  Location: [facility];  Service: Obstetrics;  Laterality: N/A;  . CHOLECYSTECTOMY N/A 10/16/2015   Procedure: LAPAROSCOPIC CHOLECYSTECTOMY WITH POSSIBLE INTRAOPERATIVE CHOLANGIOGRAM;  Surgeon: Donnie Mesa, MD;  Location: [facility];  Service: General;  Laterality: N/A;  . CLOSED REDUCTION MANDIBLE WITH MANDIBULOMA N/A 07/17/2017   Procedure: REDUCTION TMJ WITH INTRAMAXILLARY FIXATION;  Surgeon: Diona Browner, DDS;  Location: [facility];  Service: Oral Surgery;  Laterality: N/A;  . ESOPHAGOGASTRODUODENOSCOPY (EGD) WITH PROPOFOL N/A 11/15/2016   Procedure: ESOPHAGOGASTRODUODENOSCOPY (EGD) WITH PROPOFOL;  Surgeon: Clarene Essex, MD;  Location: [facility];  Service: Endoscopy;  Laterality: N/A;  . INGUINAL HERNIA REPAIR Bilateral ~ 1996  . NERVE, TENDON AND ARTERY REPAIR Right 09/23/2012   Procedure: I&D and Repair As Necessary/Right Hand and Palm;  Surgeon: Roseanne Kaufman, MD;  Location: [facility];  Service: Orthopedics;  Laterality: Right;  . OMENTECTOMY N/A 12/25/2018   Procedure: OMENTECTOMY;  Surgeon: Lafonda Mosses, MD;  Location: [facility];  Service: Gynecology;  Laterality: N/A;  . SALPINGOOPHORECTOMY N/A 12/25/2018   Procedure: OPEN UNILATERAL  SALPINGO OOPHORECTOMY WITH STAGING;  Surgeon: Lafonda Mosses, MD;  Location: [facility];  Service: Gynecology;  Laterality: N/A;  . TONSILLECTOMY      Family History  Problem Relation Age of Onset  . Cancer Mother        cervical cancer  . Asthma Mother   . Hypertension Father   . Sickle cell anemia Father   . Asthma Sister   . Diabetes Maternal Aunt   . Lymphoma Maternal Grandmother              Social History:  reports that she quit smoking about 3 years ago. Her smoking use included cigarettes. She has a 1.00 pack-year  smoking history. She has never used smokeless tobacco. She reports that she does not drink alcohol and does not use drugs.  Allergies  Allergen Reactions  . Cheese Shortness Of Breath  . Chocolate Shortness Of Breath  . Ibuprofen Hives and Shortness Of Breath    Children's ibuprofen  . Ivp Dye [Iodinated Diagnostic Agents] Hives, Shortness Of Breath and Itching  . Latex Anaphylaxis, Swelling and Other (See Comments)    Reaction:  Localized swelling   . Orange Juice [Orange Oil] Shortness Of Breath  . Other Hives and Other (See Comments)    Pt states that she is allergic to all steroids except IV solu-medrol.    Marland Kitchen Peach [Prunus Persica] Anaphylaxis  . Peanuts [Peanut Oil] Anaphylaxis  . Pear Anaphylaxis  . Prednisone Hives  . Raspberry Anaphylaxis  . Tylenol [Acetaminophen] Hives, Shortness Of Breath and Other (See Comments)    Pt states that this is only with the liquid form  . Amoxicillin Hives and Other (See Comments)    Has patient had a PCN reaction causing immediate rash, facial/tongue/throat swelling, SOB or lightheadedness with hypotension: No Has patient had a PCN reaction causing severe rash involving mucus membranes or skin necrosis: No Has patient had a PCN reaction that required hospitalization: No Has patient had a PCN reaction occurring within the last 10 years: No If all of the above answers are "NO", then may proceed with Cephalosporin use.  Marland Kitchen Apricot Flavor Hives  . Doxycycline Hives  . Erythromycin Hives  . Milk Of Magnesia [Magnesium Hydroxide] Hives and Itching  . Penicillins Hives and Other (See Comments)    Has patient had a PCN reaction causing immediate rash, facial/tongue/throat swelling, SOB or lightheadedness with hypotension: No Has patient had a PCN reaction causing severe rash involving mucus membranes or skin necrosis: No Has patient had a PCN reaction that required hospitalization: No Has patient had a PCN reaction occurring within the last 10 years:  No If all of the above answers are "NO", then may proceed with Cephalosporin use.    HOME MEDICATIONS:  No current facility-administered medications on file prior to encounter.   Current Outpatient Medications on File Prior to Encounter  Medication Sig Dispense Refill  . levETIRAcetam (KEPPRA) 1000 MG tablet Take 1 tablet (1,000 mg total) by mouth 2 (two) times daily. 60 tablet 0  . albuterol (PROVENTIL HFA;VENTOLIN HFA) 108 (90 Base) MCG/ACT inhaler Inhale 2 puffs into the lungs every 4 (four) hours as needed for wheezing or shortness of breath. (Patient not taking: Reported on 07/04/2019) 1 Inhaler 3  . mometasone-formoterol (DULERA) 200-5 MCG/ACT AERO Inhale 2 puffs into the lungs 2 (two) times daily. (Patient not taking: Reported on 07/04/2019)    . SUMAtriptan (IMITREX) 50 MG tablet Take 1 tablet (50 mg total) by mouth daily. May take one more tablet two hours after the first. No more than two tablets per day. (Patient not taking: Reported on 03/28/2019) 20 tablet 1  . topiramate (TOPAMAX) 50 MG tablet Take 1 tablet (50 mg total) by mouth at bedtime. (Patient not taking: Reported on 03/28/2019) 30 tablet 3  . [DISCONTINUED] cetirizine (ZYRTEC) 10 MG tablet Take 1 tablet (10 mg total) by mouth daily. (Patient not taking: Reported on 03/28/2019) 15 tablet 0     ROS:                                                                                                                                       Unable to obtain due to postictal state   Blood pressure (!) 93/49, pulse 82, temperature 98.3 F (36.8 C), temperature source Oral, resp. rate (!) 22, height 5\' 5"  (1.651 m), weight 68 kg, SpO2 100 %.   General Examination:                                                                                                       Physical Exam  HEENT-  [place]/AT   Lungs- Respirations  unlabored Extremities- No edema  Neurological Examination Mental Status: Postictally, she is somnolent to obtunded. Will not open eyes except briefly to noxious plantar stimulation. Did not answer any questions verbally, but did nod head yes to one question. Does not follow commands.  Cranial Nerves: II: Does not gaze towards or away from visual stimuli when eyelids are held open. PERRL. III,IV, VI: Eyes are conjugate with eyelids held open. No forced gaze deviation or nystagmus.  V,VII: Face symmetric, reacts to tactile stimulation VIII: hearing intact to voice IX,X: Unable to assess XI: Unable to assess XII: Unable to assess Motor/Sensory:  Moves all 4 extremities to noxious stimuli. No gross asymmetry noted. Does not participate in formal motor exam.  Deep Tendon Reflexes: Hypoactive throughout Plantars: Brisk withdrawal of BLE to plantar stimulation. Unable to assess Babinski reflex.  Cerebellar/Gait:  Unable to assess   Lab Results: Basic Metabolic Panel: Recent Labs  Lab 07/04/19 1052 07/04/19 2146  NA 139 136  K 3.4* 2.7*  CL 106 102  CO2 26 23  GLUCOSE 84 83  BUN 9 6  CREATININE 0.66 0.58  CALCIUM 8.7* 8.9    CBC: Recent Labs  Lab 07/04/19 1052 07/04/19 2146  WBC 17.1* 14.7*  NEUTROABS 14.8* 10.9*  HGB 10.8* 8.9*  HCT 35.2* 29.8*  MCV 76.0* 75.8*  PLT 337 313    Cardiac Enzymes: No results for input(s): CKTOTAL, CKMB, CKMBINDEX, TROPONINI in the last 168 hours.  Lipid Panel: No results for input(s): CHOL, TRIG, HDL, CHOLHDL, VLDL, LDLCALC in the last 168 hours.  Imaging: CT Head Wo Contrast  Result Date: 07/04/2019 CLINICAL DATA:  Seizures EXAM: CT HEAD WITHOUT CONTRAST TECHNIQUE: Contiguous axial images were obtained from the base of the skull through the vertex without intravenous contrast. COMPARISON:  March 28, 2019 FINDINGS: Brain: No evidence of acute territorial infarction, hemorrhage, hydrocephalus,extra-axial collection or mass lesion/mass  effect. Normal gray-white differentiation. Ventricles are normal in size and contour. Vascular: No hyperdense vessel or unexpected calcification. Skull: The skull is intact. No fracture or focal lesion identified. Sinuses/Orbits: The visualized paranasal sinuses and mastoid air cells are clear. The orbits and globes intact. Other: None Cervical spine: Alignment: There is straightening of the normal cervical lordosis. Skull base and vertebrae: Visualized skull base is intact. No atlanto-occipital dissociation. The vertebral body heights are well maintained. No fracture or pathologic osseous lesion seen. Soft tissues and spinal canal: The visualized paraspinal soft tissues are unremarkable. No prevertebral soft tissue swelling is seen. The spinal canal is grossly unremarkable, no large epidural collection or significant canal narrowing. Disc levels: No significant canal neural foraminal narrowing is seen. Upper chest: Subpleural blebs and biapical centrilobular emphysematous changes are seen. Thoracic inlet is within normal limits. Other: None IMPRESSION: No acute intracranial abnormality. No acute fracture or malalignment of the spine. Emphysema (ICD10-J43.9). Electronically Signed   By: Prudencio Pair M.D.   On: 07/04/2019 22:18   CT Cervical Spine Wo Contrast  Result Date: 07/04/2019 CLINICAL DATA:  Seizures EXAM: CT HEAD WITHOUT CONTRAST TECHNIQUE: Contiguous axial images were obtained from the base of the skull through the vertex without intravenous contrast. COMPARISON:  March 28, 2019 FINDINGS: Brain: No evidence of acute territorial infarction, hemorrhage, hydrocephalus,extra-axial collection or mass lesion/mass effect. Normal gray-white differentiation. Ventricles are normal in size and contour. Vascular: No hyperdense vessel or unexpected calcification. Skull: The skull is intact. No fracture or focal lesion identified. Sinuses/Orbits: The visualized paranasal sinuses and mastoid air cells are clear. The  orbits and globes intact. Other: None Cervical spine: Alignment: There is straightening of the normal cervical lordosis. Skull base and vertebrae: Visualized skull base is intact. No atlanto-occipital dissociation. The vertebral body heights are well maintained. No fracture or pathologic osseous lesion seen. Soft tissues and spinal canal: The visualized paraspinal soft tissues are unremarkable. No prevertebral soft tissue swelling is seen. The spinal canal is grossly unremarkable, no large epidural collection or significant canal narrowing. Disc levels: No significant canal neural foraminal narrowing is seen. Upper chest: Subpleural blebs and biapical centrilobular emphysematous changes are seen. Thoracic inlet is within normal limits. Other: None IMPRESSION: No  acute intracranial abnormality. No acute fracture or malalignment of the spine. Emphysema (ICD10-J43.9). Electronically Signed   By: Prudencio Pair M.D.   On: 07/04/2019 22:18    Assessment: 29 year old female with epilepsy, re-presenting with recurrent seizures in the context of noncompliance with her Keppra 1. Was loaded with an additional 2000 mg IV Keppra in the ED. Despite this, seizure recurred.  2. CT head shows no acute intracranial abnormality 3. Exam findings are most consistent with postictal state. No clinical seizure activity seen.   Recommendations: 1. Ativan 2 mg IV ordered at 0145 for recurrent seizure activity. 2. Loading with fosphenytoin, 20 mg/kg PE IV x 1. Continue with IV Dilantin 100 mg TID 3. Continue scheduled Keppra at 1000 mg IV BID.  4. STAT EEG 5. MRI brain when stable.  6. Frequent neuro checks and inpatient seizure precautions 7. When she is no longer postictal, the patient will need to be informed of outpatient seizure precautions: Per [facility] statutes, patients with seizures are not allowed to drive until  they have been seizure-free for six months. Use caution when using heavy equipment or power  tools. Avoid working on ladders or at heights. Take showers instead of baths. Ensure the water temperature is not too high on the home water heater. Do not go swimming alone. When caring for infants or small children, sit down when holding, feeding, or changing them to minimize risk of injury to the child in the event you have a seizure. Also, Maintain good sleep hygiene. Avoid alcohol.  Addendum: -- No electrographic seizures seen on EEG -- Continue to monitor  Electronically signed: Dr. Kerney Elbe 07/05/2019, 1:39 AM

## 2019-07-05 NOTE — Progress Notes (Signed)
EEG complete - results pending.  Dr. Cheral Marker ordered stat and observed with verbal interpretation of  EEG in ER.

## 2019-07-05 NOTE — Procedures (Signed)
Patient Name: Jasmine [name]  MRN: [id number]  Epilepsy Attending: Lora Havens  Referring Physician/Provider: Dr Derrick Ravel Date: 07/05/2019 Duration: 22.29 mins  Patient history: 29 year old female with epilepsy, re-presenting with recurrent seizures in the context of noncompliance with her Keppra. EEG to evaluate for seizure  Level of alertness: Asleep  AEDs during EEG study: LEV, Dilantin  Technical aspects: This EEG study was done with scalp electrodes positioned according to the 10-20 International system of electrode placement. Electrical activity was acquired at a sampling rate of 500Hz  and reviewed with a high frequency filter of 70Hz  and a low frequency filter of 1Hz . EEG data were recorded continuously and digitally stored.   Description: The posterior dominant rhythm consists of 8 Hz activity of moderate voltage (25-35 uV) seen predominantly in posterior head regions, symmetric and reactive to eye opening and eye closing. Sleep was characterized by vertex waves, sleep spindles (12 to 14 Hz), maximal frontocentral region.  Hyperventilation and photic stimulation were not performed.     IMPRESSION: This study is within normal limits. No seizures or epileptiform discharges were seen throughout the recording.  Bowdy Bair Barbra Sarks

## 2019-07-05 NOTE — Evaluation (Signed)
Physical Therapy Evaluation Patient Details Name: Jasmine [name] MRN: [id number] DOB: [birth year] Today's Date: 07/05/2019   History of Present Illness  Pt admitted on 07/04/19 after having 3 tonic-clonic seizures at home which were witnessed by her husband, admitted in AM, discharged and readmitted same day after another seizure. CT head 07/04/19 no acute abnormality 07/04/19.MRI ordered.  PMH  epilepsy, sickle cell trait and sleep apnea.  Clinical Impression  Pt presents with no major decrease in functional mobility secondary to above. PTA, pt lives with children in apartment. Pt reports she is ready to leave and communicated this on the phone to MD during amb. Today, pt able to complete bed mobility, transfers and amb independently, VSS. Pt DGI 23/24 indicating low risk of falls. Pt is independent with mobility and can d/c from physical therapy acutely.     Follow Up Recommendations No PT follow up    Equipment Recommendations  None recommended by PT    Recommendations for Other Services       Precautions / Restrictions Precautions Precautions: None      Mobility  Bed Mobility Overal bed mobility: Independent                Transfers Overall transfer level: Independent                  Ambulation/Gait Ambulation/Gait assistance: Independent     Gait Pattern/deviations: Drifts right/left;Wide base of support;Step-through pattern     General Gait Details: Pt demonstrated abnormal gait pattern with no loss of balance, pt indicated she felt good walking and communicated with MD for part of the walk, pt reports she is adamant about getting home as soon as possible  Science writer    Modified Rankin (Stroke Patients Only)       Balance Overall balance assessment: Needs assistance   Sitting balance-Leahy Scale: Normal       Standing balance-Leahy Scale: Normal                   Standardized Balance  Assessment Standardized Balance Assessment : Dynamic Gait Index   Dynamic Gait Index Level Surface: Normal Change in Gait Speed: Normal Gait with Horizontal Head Turns: Normal Gait with Vertical Head Turns: Normal Gait and Pivot Turn: Normal Step Over Obstacle: Normal Step Around Obstacles: Normal Steps: Mild Impairment (mimicked going up 6 steps holding onto rail with high knee marches) Total Score: 23       Pertinent Vitals/Pain Pain Assessment: No/denies pain    Home Living Family/patient expects to be discharged to:: Private residence Living Arrangements: Children;Parent Available Help at Discharge: Family Type of Home: Apartment Home Access: Stairs to enter Entrance Stairs-Rails: Psychiatric nurse of Steps: 6-8 Home Layout: One level Home Equipment: None      Prior Function Level of Independence: Independent               Hand Dominance        Extremity/Trunk Assessment   Upper Extremity Assessment Upper Extremity Assessment: Overall WFL for tasks assessed    Lower Extremity Assessment Lower Extremity Assessment: Overall WFL for tasks assessed    Cervical / Trunk Assessment Cervical / Trunk Assessment: Normal  Communication   Communication: No difficulties  Cognition Arousal/Alertness: Awake/alert Behavior During Therapy: WFL for tasks assessed/performed Overall Cognitive Status: Within Functional Limits for tasks assessed  General Comments General comments (skin integrity, edema, etc.): Pt VSS during amb, pt reports she desires to go home and reports she is leaving today, wants to get home to children.    Exercises Other Exercises Other Exercises: standing marches to mimick stair climbing x6   Assessment/Plan    PT Assessment Patent does not need any further PT services  PT Problem List         PT Treatment Interventions      PT Goals (Current goals can be found  in the Care Plan section)  Acute Rehab PT Goals PT Goal Formulation: All assessment and education complete, DC therapy    Frequency     Barriers to discharge        Co-evaluation               AM-PAC PT "6 Clicks" Mobility  Outcome Measure Help needed turning from your back to your side while in a flat bed without using bedrails?: None Help needed moving from lying on your back to sitting on the side of a flat bed without using bedrails?: None Help needed moving to and from a bed to a chair (including a wheelchair)?: None Help needed standing up from a chair using your arms (e.g., wheelchair or bedside chair)?: None Help needed to walk in hospital room?: None Help needed climbing 3-5 steps with a railing? : None 6 Click Score: 24    End of Session Equipment Utilized During Treatment: Gait belt Activity Tolerance: Patient tolerated treatment well Patient left: in bed;with call bell/phone within reach Nurse Communication: Mobility status      Time: 1287-8676 PT Time Calculation (min) (ACUTE ONLY): 14 min   Charges:   PT Evaluation $PT Eval Moderate Complexity: 1 Mod          Adrean Heitz SPT 07/05/2019   Rolland Porter 07/05/2019, 5:10 PM

## 2019-07-05 NOTE — Progress Notes (Signed)
Patient arrived to the floor via the stretcher unable to stay awake. VS obtained, cardiac monitoring initiated. The second bag of potassium stated. CHG admitting bath done. Seizure precaution initiated. We'll continue to monitor.

## 2019-07-06 ENCOUNTER — Telehealth: Payer: Self-pay

## 2019-07-06 NOTE — Telephone Encounter (Signed)
Transition Care Management Follow-up Telephone Call Date of discharge and from where: 6/20/201, [facility]  -left AMA  Calls placed to # [phone] and [phone], message left at both numbers requesting a call back to this CM.    Need to discuss scheduling follow up with PCP

## 2019-07-07 ENCOUNTER — Telehealth: Payer: Self-pay

## 2019-07-07 NOTE — Telephone Encounter (Signed)
Transition Care Management Follow-up Telephone Call Attempt # 2 Date of discharge and from where: 6/20/201, [facility]  -left AMA  Calls placed to # [phone] and [phone], message left at both numbers requesting a call back to this CM.    Letter sent to patient requesting that she call the clinic to schedule follow up appointment.

## 2019-07-07 NOTE — Discharge Summary (Signed)
Physician Discharge Summary  [name] EVO:[id number] DOB: 01/19/1990 DOA: 07/04/2019  PCP: Clent Demark, PA-C  Admit date: 07/04/2019 Discharge date: 07/05/2019  PATIENT LEFT AMA.     Brief/Interim Summary: Jasmine [name] is a 29 y.o. female with medical history significant of epilepsy, asthma, GERD, sickle cell trait presenting to the ED via EMS after having 3 tonic-clonic seizures at home which were witnessed by her husband.  At the time EMS arrived she was not having any more seizures but appeared to be postictal. She was started on IV anti epileptic medications, neurology consulted.  Mri BRAIN ordered. But pt refused and left AMA despite explaining the risks. Tried to get in touch with her husband but couldn't reach him.   Discharge Diagnoses:  Principal Problem:   Seizures ([place]) Active Problems:   Microcytic anemia   Leukocytosis   Hypokalemia   Asthma    Discharge Instructions   Allergies as of 07/05/2019      Reactions   Cheese Shortness Of Breath   Chocolate Shortness Of Breath   Ibuprofen Hives, Shortness Of Breath   Children's ibuprofen   Ivp Dye [iodinated Diagnostic Agents] Hives, Shortness Of Breath, Itching   Latex Anaphylaxis, Swelling, Other (See Comments)   Reaction:  Localized swelling    Orange Juice [orange Oil] Shortness Of Breath   Other Hives, Other (See Comments)   Pt states that she is allergic to all steroids except IV solu-medrol.     Peach [prunus Persica] Anaphylaxis   Peanuts [peanut Oil] Anaphylaxis   Pear Anaphylaxis   Prednisone Hives   Raspberry Anaphylaxis   Tylenol [acetaminophen] Hives, Shortness Of Breath, Other (See Comments)   Pt states that this is only with the liquid form   Amoxicillin Hives, Other (See Comments)   Has patient had a PCN reaction causing immediate rash, facial/tongue/throat swelling, SOB or lightheadedness with hypotension: No Has patient had a PCN reaction causing severe rash involving mucus membranes  or skin necrosis: No Has patient had a PCN reaction that required hospitalization: No Has patient had a PCN reaction occurring within the last 10 years: No If all of the above answers are "NO", then may proceed with Cephalosporin use.   Apricot Flavor Hives   Doxycycline Hives   Erythromycin Hives   Milk Of Magnesia [magnesium Hydroxide] Hives, Itching   Penicillins Hives, Other (See Comments)   Has patient had a PCN reaction causing immediate rash, facial/tongue/throat swelling, SOB or lightheadedness with hypotension: No Has patient had a PCN reaction causing severe rash involving mucus membranes or skin necrosis: No Has patient had a PCN reaction that required hospitalization: No Has patient had a PCN reaction occurring within the last 10 years: No If all of the above answers are "NO", then may proceed with Cephalosporin use.      Medication List    ASK your doctor about these medications   albuterol 108 (90 Base) MCG/ACT inhaler Commonly known as: VENTOLIN HFA Inhale 2 puffs into the lungs every 4 (four) hours as needed for wheezing or shortness of breath.   levETIRAcetam 1000 MG tablet Commonly known as: Keppra Take 1 tablet (1,000 mg total) by mouth 2 (two) times daily. Ask about: Which instructions should I use?   mometasone-formoterol 200-5 MCG/ACT Aero Commonly known as: DULERA Inhale 2 puffs into the lungs 2 (two) times daily.   SUMAtriptan 50 MG tablet Commonly known as: IMITREX Take 1 tablet (50 mg total) by mouth daily. May take one more tablet two hours  after the first. No more than two tablets per day.   topiramate 50 MG tablet Commonly known as: TOPAMAX Take 1 tablet (50 mg total) by mouth at bedtime.       Allergies  Allergen Reactions  . Cheese Shortness Of Breath  . Chocolate Shortness Of Breath  . Ibuprofen Hives and Shortness Of Breath    Children's ibuprofen  . Ivp Dye [Iodinated Diagnostic Agents] Hives, Shortness Of Breath and Itching  . Latex  Anaphylaxis, Swelling and Other (See Comments)    Reaction:  Localized swelling   . Orange Juice [Orange Oil] Shortness Of Breath  . Other Hives and Other (See Comments)    Pt states that she is allergic to all steroids except IV solu-medrol.    Marland Kitchen Peach [Prunus Persica] Anaphylaxis  . Peanuts [Peanut Oil] Anaphylaxis  . Pear Anaphylaxis  . Prednisone Hives  . Raspberry Anaphylaxis  . Tylenol [Acetaminophen] Hives, Shortness Of Breath and Other (See Comments)    Pt states that this is only with the liquid form  . Amoxicillin Hives and Other (See Comments)    Has patient had a PCN reaction causing immediate rash, facial/tongue/throat swelling, SOB or lightheadedness with hypotension: No Has patient had a PCN reaction causing severe rash involving mucus membranes or skin necrosis: No Has patient had a PCN reaction that required hospitalization: No Has patient had a PCN reaction occurring within the last 10 years: No If all of the above answers are "NO", then may proceed with Cephalosporin use.  Marland Kitchen Apricot Flavor Hives  . Doxycycline Hives  . Erythromycin Hives  . Milk Of Magnesia [Magnesium Hydroxide] Hives and Itching  . Penicillins Hives and Other (See Comments)    Has patient had a PCN reaction causing immediate rash, facial/tongue/throat swelling, SOB or lightheadedness with hypotension: No Has patient had a PCN reaction causing severe rash involving mucus membranes or skin necrosis: No Has patient had a PCN reaction that required hospitalization: No Has patient had a PCN reaction occurring within the last 10 years: No If all of the above answers are "NO", then may proceed with Cephalosporin use.    Consultations:  NEUROLOGY.    Procedures/Studies: EEG  Result Date: 07/05/2019 Lora Havens, MD     07/05/2019  9:05 AM Patient Name: Jasmine [name] MRN: [id number] Epilepsy Attending: Lora Havens Referring Physician/Provider: Dr Derrick Ravel Date: 07/05/2019 Duration:  22.29 mins Patient history: 29 year old female with epilepsy, re-presenting with recurrent seizures in the context of noncompliance with her Keppra. EEG to evaluate for seizure Level of alertness: Asleep AEDs during EEG study: LEV, Dilantin Technical aspects: This EEG study was done with scalp electrodes positioned according to the 10-20 International system of electrode placement. Electrical activity was acquired at a sampling rate of 500Hz  and reviewed with a high frequency filter of 70Hz  and a low frequency filter of 1Hz . EEG data were recorded continuously and digitally stored. Description: The posterior dominant rhythm consists of 8 Hz activity of moderate voltage (25-35 uV) seen predominantly in posterior head regions, symmetric and reactive to eye opening and eye closing. Sleep was characterized by vertex waves, sleep spindles (12 to 14 Hz), maximal frontocentral region.  Hyperventilation and photic stimulation were not performed.   IMPRESSION: This study is within normal limits. No seizures or epileptiform discharges were seen throughout the recording. Lora Havens   CT Head Wo Contrast  Result Date: 07/04/2019 CLINICAL DATA:  Seizures EXAM: CT HEAD WITHOUT CONTRAST TECHNIQUE: Contiguous axial images  were obtained from the base of the skull through the vertex without intravenous contrast. COMPARISON:  March 28, 2019 FINDINGS: Brain: No evidence of acute territorial infarction, hemorrhage, hydrocephalus,extra-axial collection or mass lesion/mass effect. Normal gray-white differentiation. Ventricles are normal in size and contour. Vascular: No hyperdense vessel or unexpected calcification. Skull: The skull is intact. No fracture or focal lesion identified. Sinuses/Orbits: The visualized paranasal sinuses and mastoid air cells are clear. The orbits and globes intact. Other: None Cervical spine: Alignment: There is straightening of the normal cervical lordosis. Skull base and vertebrae: Visualized skull  base is intact. No atlanto-occipital dissociation. The vertebral body heights are well maintained. No fracture or pathologic osseous lesion seen. Soft tissues and spinal canal: The visualized paraspinal soft tissues are unremarkable. No prevertebral soft tissue swelling is seen. The spinal canal is grossly unremarkable, no large epidural collection or significant canal narrowing. Disc levels: No significant canal neural foraminal narrowing is seen. Upper chest: Subpleural blebs and biapical centrilobular emphysematous changes are seen. Thoracic inlet is within normal limits. Other: None IMPRESSION: No acute intracranial abnormality. No acute fracture or malalignment of the spine. Emphysema (ICD10-J43.9). Electronically Signed   By: Prudencio Pair M.D.   On: 07/04/2019 22:18   CT Cervical Spine Wo Contrast  Result Date: 07/04/2019 CLINICAL DATA:  Seizures EXAM: CT HEAD WITHOUT CONTRAST TECHNIQUE: Contiguous axial images were obtained from the base of the skull through the vertex without intravenous contrast. COMPARISON:  March 28, 2019 FINDINGS: Brain: No evidence of acute territorial infarction, hemorrhage, hydrocephalus,extra-axial collection or mass lesion/mass effect. Normal gray-white differentiation. Ventricles are normal in size and contour. Vascular: No hyperdense vessel or unexpected calcification. Skull: The skull is intact. No fracture or focal lesion identified. Sinuses/Orbits: The visualized paranasal sinuses and mastoid air cells are clear. The orbits and globes intact. Other: None Cervical spine: Alignment: There is straightening of the normal cervical lordosis. Skull base and vertebrae: Visualized skull base is intact. No atlanto-occipital dissociation. The vertebral body heights are well maintained. No fracture or pathologic osseous lesion seen. Soft tissues and spinal canal: The visualized paraspinal soft tissues are unremarkable. No prevertebral soft tissue swelling is seen. The spinal canal is  grossly unremarkable, no large epidural collection or significant canal narrowing. Disc levels: No significant canal neural foraminal narrowing is seen. Upper chest: Subpleural blebs and biapical centrilobular emphysematous changes are seen. Thoracic inlet is within normal limits. Other: None IMPRESSION: No acute intracranial abnormality. No acute fracture or malalignment of the spine. Emphysema (ICD10-J43.9). Electronically Signed   By: Prudencio Pair M.D.   On: 07/04/2019 22:18      Discharge Exam: Vitals:   07/05/19 0504 07/05/19 0741  BP: 95/63 115/66  Pulse: 90   Resp: 19   Temp: 99 F (37.2 C) 98.5 F (36.9 C)  SpO2: 97%    Vitals:   07/05/19 0408 07/05/19 0409 07/05/19 0504 07/05/19 0741  BP:   95/63 115/66  Pulse:   90   Resp: 19 19 19    Temp:   99 F (37.2 C) 98.5 F (36.9 C)  TempSrc:   Oral   SpO2:   97%   Weight:   72.9 kg   Height:   5\' 4"  (1.626 m)        The results of significant diagnostics from this hospitalization (including imaging, microbiology, ancillary and laboratory) are listed below for reference.     Microbiology: Recent Results (from the past 240 hour(s))  SARS Coronavirus 2 by RT PCR (hospital order, performed  in [facility] lab) Nasopharyngeal Nasopharyngeal Swab     Status: None   Collection Time: 07/05/19  2:25 AM   Specimen: Nasopharyngeal Swab  Result Value Ref Range Status   SARS Coronavirus 2 NEGATIVE NEGATIVE Final    Comment: (NOTE) SARS-CoV-2 target nucleic acids are NOT DETECTED.  The SARS-CoV-2 RNA is generally detectable in upper and lower respiratory specimens during the acute phase of infection. The lowest concentration of SARS-CoV-2 viral copies this assay can detect is 250 copies / mL. A negative result does not preclude SARS-CoV-2 infection and should not be used as the sole basis for treatment or other patient management decisions.  A negative result may occur with improper specimen collection / handling,  submission of specimen other than nasopharyngeal swab, presence of viral mutation(s) within the areas targeted by this assay, and inadequate number of viral copies (<250 copies / mL). A negative result must be combined with clinical observations, patient history, and epidemiological information.  Fact Sheet for Patients:   StrictlyIdeas.no  Fact Sheet for Healthcare Providers: BankingDealers.co.za  This test is not yet approved or  cleared by the [place] FDA and has been authorized for detection and/or diagnosis of SARS-CoV-2 by FDA under an Emergency Use Authorization (EUA).  This EUA will remain in effect (meaning this test can be used) for the duration of the COVID-19 declaration under Section 564(b)(1) of the Act, 21 U.S.C. section 360bbb-3(b)(1), unless the authorization is terminated or revoked sooner.  Performed at [facility], [place] [address] [place], [place] [address]      Labs: BNP (last 3 results) No results for input(s): BNP in the last 8760 hours. Basic Metabolic Panel: Recent Labs  Lab 07/04/19 1052 07/04/19 2146 07/05/19 0353 07/05/19 1141  NA 139 136 138  --   K 3.4* 2.7* 2.7* 3.3*  CL 106 102 109  --   CO2 26 23 23   --   GLUCOSE 84 83 94  --   BUN 9 6 <5*  --   CREATININE 0.66 0.58 0.59  --   CALCIUM 8.7* 8.9 7.9*  --   MG  --   --  1.9  --    Liver Function Tests: Recent Labs  Lab 07/04/19 2146  AST 25  ALT 19  ALKPHOS 49  BILITOT 0.7  PROT 6.5  ALBUMIN 3.8   No results for input(s): LIPASE, AMYLASE in the last 168 hours. No results for input(s): AMMONIA in the last 168 hours. CBC: Recent Labs  Lab 07/04/19 1052 07/04/19 2146 07/05/19 0353  WBC 17.1* 14.7* 11.3*  NEUTROABS 14.8* 10.9*  --   HGB 10.8* 8.9* 7.9*  HCT 35.2* 29.8* 26.2*  MCV 76.0* 75.8* 75.7*  PLT 337 313 265   Cardiac Enzymes: No results for input(s): CKTOTAL, CKMB, CKMBINDEX, TROPONINI in the last 168  hours. BNP: Invalid input(s): POCBNP CBG: Recent Labs  Lab 07/04/19 1049 07/04/19 2220 07/05/19 0421  GLUCAP 103* 79 91   D-Dimer No results for input(s): DDIMER in the last 72 hours. Hgb A1c No results for input(s): HGBA1C in the last 72 hours. Lipid Profile No results for input(s): CHOL, HDL, LDLCALC, TRIG, CHOLHDL, LDLDIRECT in the last 72 hours. Thyroid function studies No results for input(s): TSH, T4TOTAL, T3FREE, THYROIDAB in the last 72 hours.  Invalid input(s): FREET3 Anemia work up Recent Labs    07/05/19 0353  FERRITIN 6*  TIBC 447  IRON 30   Urinalysis    Component Value Date/Time  COLORURINE YELLOW 07/05/2019 1602   APPEARANCEUR CLEAR 07/05/2019 1602   LABSPEC 1.009 07/05/2019 1602   PHURINE 7.0 07/05/2019 1602   GLUCOSEU NEGATIVE 07/05/2019 1602   HGBUR NEGATIVE 07/05/2019 1602   BILIRUBINUR NEGATIVE 07/05/2019 1602   KETONESUR 5 (A) 07/05/2019 1602   PROTEINUR NEGATIVE 07/05/2019 1602   UROBILINOGEN 1.0 11/04/2014 0946   NITRITE NEGATIVE 07/05/2019 1602   LEUKOCYTESUR NEGATIVE 07/05/2019 1602   Sepsis Labs Invalid input(s): PROCALCITONIN,  WBC,  LACTICIDVEN Microbiology Recent Results (from the past 240 hour(s))  SARS Coronavirus 2 by RT PCR (hospital order, performed in [facility] hospital lab) Nasopharyngeal Nasopharyngeal Swab     Status: None   Collection Time: 07/05/19  2:25 AM   Specimen: Nasopharyngeal Swab  Result Value Ref Range Status   SARS Coronavirus 2 NEGATIVE NEGATIVE Final    Comment: (NOTE) SARS-CoV-2 target nucleic acids are NOT DETECTED.  The SARS-CoV-2 RNA is generally detectable in upper and lower respiratory specimens during the acute phase of infection. The lowest concentration of SARS-CoV-2 viral copies this assay can detect is 250 copies / mL. A negative result does not preclude SARS-CoV-2 infection and should not be used as the sole basis for treatment or other patient management decisions.  A negative result may  occur with improper specimen collection / handling, submission of specimen other than nasopharyngeal swab, presence of viral mutation(s) within the areas targeted by this assay, and inadequate number of viral copies (<250 copies / mL). A negative result must be combined with clinical observations, patient history, and epidemiological information.  Fact Sheet for Patients:   StrictlyIdeas.no  Fact Sheet for Healthcare Providers: BankingDealers.co.za  This test is not yet approved or  cleared by the [place] FDA and has been authorized for detection and/or diagnosis of SARS-CoV-2 by FDA under an Emergency Use Authorization (EUA).  This EUA will remain in effect (meaning this test can be used) for the duration of the COVID-19 declaration under Section 564(b)(1) of the Act, 21 U.S.C. section 360bbb-3(b)(1), unless the authorization is terminated or revoked sooner.  Performed at [facility], [place] [address] [place], [place] [address]       SIGNED:   Hosie Poisson, MD  [facility] 07/07/2019, 10:22 AM

## 2019-07-10 ENCOUNTER — Encounter: Payer: Self-pay | Admitting: Gynecologic Oncology

## 2019-07-10 ENCOUNTER — Inpatient Hospital Stay (HOSPITAL_BASED_OUTPATIENT_CLINIC_OR_DEPARTMENT_OTHER): Payer: Medicaid Other | Admitting: Gynecologic Oncology

## 2019-07-10 ENCOUNTER — Other Ambulatory Visit: Payer: Self-pay

## 2019-07-10 ENCOUNTER — Ambulatory Visit (HOSPITAL_COMMUNITY)
Admission: RE | Admit: 2019-07-10 | Discharge: 2019-07-10 | Disposition: A | Discharge status: Home / Self Care | Payer: Medicaid Other | Source: Ambulatory Visit | Attending: Gynecologic Oncology | Admitting: Gynecologic Oncology

## 2019-07-10 ENCOUNTER — Emergency Department (HOSPITAL_COMMUNITY)
Admission: EM | Admit: 2019-07-10 | Discharge: 2019-07-10 | Disposition: A | Discharge status: Home / Self Care | Payer: Medicaid Other | Attending: Emergency Medicine | Admitting: Emergency Medicine

## 2019-07-10 ENCOUNTER — Inpatient Hospital Stay: Payer: Medicaid Other | Attending: Gynecologic Oncology

## 2019-07-10 ENCOUNTER — Encounter (HOSPITAL_COMMUNITY): Payer: Self-pay

## 2019-07-10 ENCOUNTER — Encounter (HOSPITAL_COMMUNITY): Payer: Self-pay | Admitting: Emergency Medicine

## 2019-07-10 ENCOUNTER — Emergency Department (HOSPITAL_COMMUNITY): Payer: Medicaid Other

## 2019-07-10 VITALS — BP 123/69 | HR 90 | Temp 99.2°F | Resp 18 | Ht 64.0 in | Wt 151.2 lb

## 2019-07-10 DIAGNOSIS — D3912 Neoplasm of uncertain behavior of left ovary: Secondary | ICD-10-CM

## 2019-07-10 DIAGNOSIS — Z23 Encounter for immunization: Secondary | ICD-10-CM | POA: Diagnosis not present

## 2019-07-10 DIAGNOSIS — G4730 Sleep apnea, unspecified: Secondary | ICD-10-CM | POA: Diagnosis not present

## 2019-07-10 DIAGNOSIS — Z79899 Other long term (current) drug therapy: Secondary | ICD-10-CM | POA: Insufficient documentation

## 2019-07-10 DIAGNOSIS — G40909 Epilepsy, unspecified, not intractable, without status epilepticus: Secondary | ICD-10-CM | POA: Diagnosis not present

## 2019-07-10 DIAGNOSIS — Z90721 Acquired absence of ovaries, unilateral: Secondary | ICD-10-CM | POA: Diagnosis not present

## 2019-07-10 DIAGNOSIS — R569 Unspecified convulsions: Secondary | ICD-10-CM

## 2019-07-10 DIAGNOSIS — D573 Sickle-cell trait: Secondary | ICD-10-CM | POA: Insufficient documentation

## 2019-07-10 DIAGNOSIS — J45909 Unspecified asthma, uncomplicated: Secondary | ICD-10-CM | POA: Insufficient documentation

## 2019-07-10 DIAGNOSIS — K219 Gastro-esophageal reflux disease without esophagitis: Secondary | ICD-10-CM | POA: Insufficient documentation

## 2019-07-10 DIAGNOSIS — Z91010 Allergy to peanuts: Secondary | ICD-10-CM | POA: Insufficient documentation

## 2019-07-10 DIAGNOSIS — Z86718 Personal history of other venous thrombosis and embolism: Secondary | ICD-10-CM | POA: Diagnosis not present

## 2019-07-10 DIAGNOSIS — Z87891 Personal history of nicotine dependence: Secondary | ICD-10-CM | POA: Diagnosis not present

## 2019-07-10 DIAGNOSIS — Z91040 Latex allergy status: Secondary | ICD-10-CM | POA: Diagnosis not present

## 2019-07-10 DIAGNOSIS — S0990XA Unspecified injury of head, initial encounter: Secondary | ICD-10-CM | POA: Diagnosis not present

## 2019-07-10 DIAGNOSIS — J45901 Unspecified asthma with (acute) exacerbation: Secondary | ICD-10-CM | POA: Diagnosis not present

## 2019-07-10 DIAGNOSIS — Z8543 Personal history of malignant neoplasm of ovary: Secondary | ICD-10-CM | POA: Diagnosis not present

## 2019-07-10 DIAGNOSIS — S99921A Unspecified injury of right foot, initial encounter: Secondary | ICD-10-CM | POA: Diagnosis not present

## 2019-07-10 DIAGNOSIS — S99922A Unspecified injury of left foot, initial encounter: Secondary | ICD-10-CM | POA: Diagnosis not present

## 2019-07-10 DIAGNOSIS — S199XXA Unspecified injury of neck, initial encounter: Secondary | ICD-10-CM | POA: Diagnosis not present

## 2019-07-10 LAB — CBC WITH DIFFERENTIAL/PLATELET
Abs Immature Granulocytes: 0.08 10*3/uL — ABNORMAL HIGH (ref 0.00–0.07)
Basophils Absolute: 0.1 10*3/uL (ref 0.0–0.1)
Basophils Relative: 0 %
Eosinophils Absolute: 0.1 10*3/uL (ref 0.0–0.5)
Eosinophils Relative: 1 %
HCT: 35.1 % — ABNORMAL LOW (ref 36.0–46.0)
Hemoglobin: 10.2 g/dL — ABNORMAL LOW (ref 12.0–15.0)
Immature Granulocytes: 1 %
Lymphocytes Relative: 13 %
Lymphs Abs: 2.3 10*3/uL (ref 0.7–4.0)
MCH: 22.7 pg — ABNORMAL LOW (ref 26.0–34.0)
MCHC: 29.1 g/dL — ABNORMAL LOW (ref 30.0–36.0)
MCV: 78 fL — ABNORMAL LOW (ref 80.0–100.0)
Monocytes Absolute: 0.6 10*3/uL (ref 0.1–1.0)
Monocytes Relative: 4 %
Neutro Abs: 14.1 10*3/uL — ABNORMAL HIGH (ref 1.7–7.7)
Neutrophils Relative %: 81 %
Platelets: 286 10*3/uL (ref 150–400)
RBC: 4.5 MIL/uL (ref 3.87–5.11)
RDW: 17.2 % — ABNORMAL HIGH (ref 11.5–15.5)
WBC: 17.2 10*3/uL — ABNORMAL HIGH (ref 4.0–10.5)
nRBC: 0 % (ref 0.0–0.2)

## 2019-07-10 LAB — COMPREHENSIVE METABOLIC PANEL
ALT: 21 U/L (ref 0–44)
AST: 23 U/L (ref 15–41)
Albumin: 4.3 g/dL (ref 3.5–5.0)
Alkaline Phosphatase: 57 U/L (ref 38–126)
Anion gap: 14 (ref 5–15)
BUN: 6 mg/dL (ref 6–20)
CO2: 22 mmol/L (ref 22–32)
Calcium: 8.9 mg/dL (ref 8.9–10.3)
Chloride: 104 mmol/L (ref 98–111)
Creatinine, Ser: 0.75 mg/dL (ref 0.44–1.00)
GFR calc Af Amer: >60 mL/min (ref >60)
GFR calc non Af Amer: >60 mL/min (ref >60)
Glucose, Bld: 95 mg/dL (ref 70–99)
Potassium: 3.8 mmol/L (ref 3.5–5.1)
Sodium: 140 mmol/L (ref 135–145)
Total Bilirubin: 0.2 mg/dL — ABNORMAL LOW (ref 0.3–1.2)
Total Protein: 7.6 g/dL (ref 6.5–8.1)

## 2019-07-10 LAB — I-STAT BETA HCG BLOOD, ED (MC, WL, AP ONLY): I-stat hCG, quantitative: <5 m[IU]/mL (ref <5)

## 2019-07-10 LAB — CEA (IN HOUSE-CHCC): CEA (CHCC-In House): <1 ng/mL (ref 0.00–5.00)

## 2019-07-10 LAB — CBG MONITORING, ED: Glucose-Capillary: 86 mg/dL (ref 70–99)

## 2019-07-10 MED ORDER — LEVETIRACETAM IN NACL 1000 MG/100ML IV SOLN
1000.0000 mg | Freq: Once | INTRAVENOUS | Status: AC
  Administered 2019-07-10: 1000 mg via INTRAVENOUS
  Filled 2019-07-10: qty 100

## 2019-07-10 MED ORDER — TOPIRAMATE 50 MG PO TABS
100.0000 mg | ORAL_TABLET | Freq: Every evening | ORAL | 0 refills | Status: DC
Start: 2019-07-10 — End: 2019-07-29

## 2019-07-10 MED ORDER — TETANUS-DIPHTH-ACELL PERTUSSIS 5-2.5-18.5 LF-MCG/0.5 IM SUSP
0.5000 mL | Freq: Once | INTRAMUSCULAR | Status: AC
  Administered 2019-07-10: 0.5 mL via INTRAMUSCULAR
  Filled 2019-07-10: qty 0.5

## 2019-07-10 MED ORDER — FENTANYL CITRATE (PF) 100 MCG/2ML IJ SOLN
50.0000 ug | Freq: Once | INTRAMUSCULAR | Status: AC
  Administered 2019-07-10: 50 ug via INTRAVENOUS
  Filled 2019-07-10: qty 2

## 2019-07-10 MED ORDER — LEVETIRACETAM 500 MG PO TABS: 1500.0000 mg | ORAL_TABLET | Freq: Two times a day (BID) | ORAL | 0 refills | Status: DC

## 2019-07-10 MED ORDER — SODIUM CHLORIDE 0.9 % IV BOLUS
1000.0000 mL | Freq: Once | INTRAVENOUS | Status: AC
  Administered 2019-07-10: 1000 mL via INTRAVENOUS

## 2019-07-10 NOTE — Discharge Instructions (Addendum)
Increase your Keppra so you are taking 1500 mg twice a day and increase your Topamax so you are taking 100 mg at bedtime.  Follow-up with your doctor within a week

## 2019-07-10 NOTE — ED Triage Notes (Signed)
Pt was outside [facility]. Found on ground having seizure like activity. Pt has swelling to left eye, abrasions to feet, right shin, right knee, to knuckles and right 5th finger.

## 2019-07-10 NOTE — ED Notes (Signed)
Patient arrived [facility].  Patient found on ground face down having a grand mal seizure lasting approximately 2 minutes.  Patient leaving after having a transvaginal US.  Respiratory and Lucius Conn arrived with patient.  Patient arrives on NRB.  Has a laceration to her left eyebrow, right toe, abrasions on right arm, and right pinky nail has been riped off.  Patient postictal on arrival.

## 2019-07-10 NOTE — ED Provider Notes (Signed)
[facility] DEPT Provider Note   CSN: [id number] Arrival date & time: 07/10/19  1339     History Chief Complaint  Patient presents with   Seizures    Jasmine [name] is a 29 y.o. female.  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  History is limited from patient on arrival, level five caveat for altered mental status.  Reported she had a 2-minute seizure that was witnessed, and had fallen and hit her head in the parking lot.  Upon waking reports pain in bilateral feet and headache. Not sure last tetanus.  Reports compliance with keppra and topamax. [facility] has nothad any appt availability yet so she has not seen them.  No recent illness.  Has not been sleeping as much, taking more pain medications. No etoh.     Past Medical History:  Diagnosis Date   Asthma    Complication of anesthesia    "I wake up during anesthesia" (10/14/2015)   DVT (deep vein thrombosis) in pregnancy    Epilepsy ([place])    GERD (gastroesophageal reflux disease)    Heart murmur    last work up age 64- no symtoms   Pancreatitis    Sickle cell trait ([place])    Sleep apnea    "used to wear CPAP; don't have one here in [place] since I moved in 2014" (10/14/2015)    Patient Active Problem List   Diagnosis Date Noted   Status epilepticus ([place]) 03/28/2019   Moderate persistent asthma 03/19/2019   Ovarian tumor of borderline malignancy, 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   Pelvic mass in female 12/25/2018   Abnormal uterine bleeding    Pelvic mass 12/22/2018   Dehydration 12/22/2018   Asthma exacerbation 12/01/2017   Acute hypercapnic respiratory failure ([place]) 12/01/2017   Exocrine pancreatic insufficiency 12/01/2017   Facial contusion 07/17/2017   Closed  dislocation of right jaw 07/17/2017   Nausea with hives 07/09/2017   History of DVT (deep vein thrombosis) in pregnancy ([place]) 05/29/2017   Sickle cell trait ([place]) 05/29/2017   Acute asthma exacerbation 05/29/2017   Chronic pancreatitis ([place]) 05/29/2017   Seizures ([place]) 05/29/2017   Tobacco abuse 05/29/2017   Acute on chronic respiratory failure with hypoxemia ([place]) 05/29/2017   Pneumonia 05/04/2017   SOB (shortness of breath) 04/08/2017   Cough 04/07/2017   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   Acute on chronic respiratory failure with hypoxia ([place]) 03/10/2017   Colitis 11/12/2016   Seizure ([place]) 11/12/2016   Calculus of bile duct with acute cholecystitis with obstruction 10/14/2015   Adjustment disorder with mixed anxiety and depressed mood 04/26/2015   Low blood potassium 04/19/2015   Asthma with acute exacerbation 04/19/2015   Asthma 01/03/2015   Asthma, chronic, unspecified asthma severity, with acute exacerbation 01/03/2015   Influenza-like illness 01/03/2015   Status post cesarean section 11/08/2014   Previous cesarean section complicating pregnancy, antepartum condition or complication    GERD (gastroesophageal reflux disease)    Gastroesophageal reflux disease without esophagitis    Abnormal biochemical finding on antenatal screening of mother    Abnormal quad screen    Echogenic focus of heart of fetus affecting antepartum care of mother    Drug use affecting pregnancy, antepartum 05/19/2014   Seizure disorder during pregnancy, antepartum ([place]) 05/13/2014   Supervision of high  risk pregnancy, antepartum 61/22/4497   Asthma complicating pregnancy, antepartum 05/13/2014   History of food anaphylaxis 05/13/2014   Hypokalemia 04/08/2014   Seizure disorder, sept 2015 last seizure 01/19/2013   Microcytic anemia 01/19/2013   Leukocytosis 01/19/2013    Past Surgical History:  Procedure Laterality Date   APPENDECTOMY     CESAREAN  SECTION  2013   CESAREAN SECTION N/A 11/08/2014   Procedure: CESAREAN SECTION;  Surgeon: Truett Mainland, DO;  Location: [facility];  Service: Obstetrics;  Laterality: N/A;   CHOLECYSTECTOMY N/A 10/16/2015   Procedure: LAPAROSCOPIC CHOLECYSTECTOMY WITH POSSIBLE INTRAOPERATIVE CHOLANGIOGRAM;  Surgeon: Donnie Mesa, MD;  Location: [facility];  Service: General;  Laterality: N/A;   CLOSED REDUCTION MANDIBLE WITH MANDIBULOMA N/A 07/17/2017   Procedure: REDUCTION TMJ WITH INTRAMAXILLARY FIXATION;  Surgeon: Diona Browner, DDS;  Location: [facility];  Service: Oral Surgery;  Laterality: N/A;   ESOPHAGOGASTRODUODENOSCOPY (EGD) WITH PROPOFOL N/A 11/15/2016   Procedure: ESOPHAGOGASTRODUODENOSCOPY (EGD) WITH PROPOFOL;  Surgeon: Clarene Essex, MD;  [facility];  Service: Endoscopy;  Laterality: N/A;   INGUINAL HERNIA REPAIR Bilateral ~ 1996   NERVE, TENDON AND ARTERY REPAIR Right 09/23/2012   Procedure: I&D and Repair As Necessary/Right Hand and Palm;  Surgeon: Roseanne Kaufman, MD;  Location: [facility];  Service: Orthopedics;  Laterality: Right;   OMENTECTOMY N/A 12/25/2018   Procedure: OMENTECTOMY;  Surgeon: Lafonda Mosses, MD;  [facility];  Service: Gynecology;  Laterality: N/A;   SALPINGOOPHORECTOMY N/A 12/25/2018   Procedure: OPEN UNILATERAL  SALPINGO OOPHORECTOMY WITH STAGING;  Surgeon: Lafonda Mosses, MD;  [facility];  Service: Gynecology;  Laterality: N/A;   TONSILLECTOMY       OB History    Gravida  3   Para  3   Term  3   Preterm      AB      Living  3     SAB      TAB      Ectopic      Multiple  0   Live Births  3           Family History  Problem Relation Age of Onset   Cancer Mother        cervical cancer   Asthma Mother    Hypertension Father    Sickle cell anemia Father    Asthma Sister    Diabetes Maternal Aunt    Lymphoma Maternal Grandmother     Social History   Tobacco Use   Smoking status: Former Smoker    Packs/day: 0.25     Years: 4.00    Pack years: 1.00    Types: Cigarettes    Quit date: 10/02/2015    Years since quitting: 3.7   Smokeless tobacco: Never Used  Vaping Use   Vaping Use: Never used  Substance Use Topics   Alcohol use: No    Alcohol/week: 0.0 standard drinks   Drug use: No    Home Medications Prior to Admission medications   Medication Sig Start Date End Date Taking? Authorizing Provider  albuterol (PROVENTIL HFA;VENTOLIN HFA) 108 (90 Base) MCG/ACT inhaler Inhale 2 puffs into the lungs every 4 (four) hours as needed for wheezing or shortness of breath. 08/20/17   Clent Demark, PA-C  levETIRAcetam (KEPPRA) 500 MG tablet Take 3 tablets (1,500 mg total) by mouth 2 (two) times daily. 07/10/19 08/09/19  Gareth Morgan, MD  SUMAtriptan (IMITREX) 50 MG tablet Take 1 tablet (50 mg total) by mouth daily.  May take one more tablet two hours after the first. No more than two tablets per day. 01/06/18   Gildardo Pounds, NP  topiramate (TOPAMAX) 50 MG tablet Take 2 tablets (100 mg total) by mouth at bedtime. 07/10/19   Gareth Morgan, MD  cetirizine (ZYRTEC) 10 MG tablet Take 1 tablet (10 mg total) by mouth daily. Patient not taking: Reported on 03/28/2019 12/11/17 05/30/19  Ok Edwards, PA-C    Allergies    Cheese, Chocolate, Ibuprofen, Ivp dye [iodinated diagnostic agents], Latex, Orange juice [orange oil], Other, Peach [prunus persica], Peanuts [peanut oil], Pear, Prednisone, Raspberry, Tylenol [acetaminophen], Amoxicillin, Apricot flavor, Doxycycline, Erythromycin, Milk of magnesia [magnesium hydroxide], and Penicillins  Review of Systems   Review of Systems  Constitutional: Negative for fever.  HENT: Negative for sore throat.   Eyes: Negative for visual disturbance.  Respiratory: Negative for cough and shortness of breath.   Cardiovascular: Negative for chest pain.  Gastrointestinal: Negative for abdominal pain.  Genitourinary: Negative for difficulty urinating.  Musculoskeletal:  Positive for arthralgias. Negative for back pain and neck pain.  Skin: Positive for wound. Negative for rash.  Neurological: Positive for seizures and headaches. Negative for syncope, weakness and numbness.    Physical Exam Updated Vital Signs BP 118/77    Pulse 77    Temp 98.2 F (36.8 C) (Oral)    Resp 12    SpO2 100%   Physical Exam Vitals and nursing note reviewed.  Constitutional:      General: She is not in acute distress.    Appearance: Normal appearance. She is ill-appearing. She is not toxic-appearing or diaphoretic.  HENT:     Head: Normocephalic.     Comments: Left periorbital hematoma Eyes:     Conjunctiva/sclera: Conjunctivae normal.  Cardiovascular:     Rate and Rhythm: Normal rate and regular rhythm.     Pulses: Normal pulses.  Pulmonary:     Effort: Pulmonary effort is normal. No respiratory distress.  Musculoskeletal:        General: No deformity or signs of injury.     Cervical back: No rigidity.     Comments: Good ROM of fingers, no bony tenderness   Tenderness to feet bilat  Skin:    General: Skin is warm and dry.     Coloration: Skin is not jaundiced or pale.     Comments: Abrasion bilateral feet, hands  Neurological:     General: No focal deficit present.     Comments: Sleepy, opens eyes to voice     ED Results / Procedures / Treatments   Labs (all labs ordered are listed, but only abnormal results are displayed) Labs Reviewed  CBC WITH DIFFERENTIAL/PLATELET - Abnormal; Notable for the following components:      Result Value   WBC 17.2 (*)    Hemoglobin 10.2 (*)    HCT 35.1 (*)    MCV 78.0 (*)    MCH 22.7 (*)    MCHC 29.1 (*)    RDW 17.2 (*)    Neutro Abs 14.1 (*)    Abs Immature Granulocytes 0.08 (*)    All other components within normal limits  COMPREHENSIVE METABOLIC PANEL - Abnormal; Notable for the following components:   Total Bilirubin 0.2 (*)    All other components within normal limits  CBG MONITORING, ED  I-STAT BETA HCG  BLOOD, ED ([facility], [facility], [facility] ONLY)    EKG None  Radiology CT Head Wo Contrast  Result Date: 07/10/2019 CLINICAL DATA:  29 year old female with head trauma. EXAM: CT HEAD WITHOUT CONTRAST CT CERVICAL SPINE WITHOUT CONTRAST TECHNIQUE: Multidetector CT imaging of the head and cervical spine was performed following the standard protocol without intravenous contrast. Multiplanar CT image reconstructions of the cervical spine were also generated. COMPARISON:  Head CT dated 07/04/2019. FINDINGS: CT HEAD FINDINGS Brain: The ventricles and sulci appropriate size for patient's age. The gray-white matter discrimination is preserved. There is no acute intracranial hemorrhage. No mass effect or midline shift. No extra-axial fluid collection. Vascular: No hyperdense vessel or unexpected calcification. Skull: Normal. Negative for fracture or focal lesion. Sinuses/Orbits: No acute finding. Other: None. CT CERVICAL SPINE FINDINGS Alignment: No acute subluxation. There is straightening of normal cervical lordosis per Skull base and vertebrae: No acute fracture. Soft tissues and spinal canal: No prevertebral fluid or swelling. No visible canal hematoma. Disc levels: No acute findings. No significant degenerative changes. Upper chest: Paraseptal emphysema. Other: None IMPRESSION: 1. Normal noncontrast CT of the brain. 2. No acute/traumatic cervical spine pathology. Electronically Signed   By: Anner Crete M.D.   On: 07/10/2019 15:14   CT Cervical Spine Wo Contrast  Result Date: 07/10/2019 CLINICAL DATA:  29 year old female with head trauma. EXAM: CT HEAD WITHOUT CONTRAST CT CERVICAL SPINE WITHOUT CONTRAST TECHNIQUE: Multidetector CT imaging of the head and cervical spine was performed following the standard protocol without intravenous contrast. Multiplanar CT image reconstructions of the cervical spine were also generated. COMPARISON:  Head CT dated 07/04/2019. FINDINGS: CT HEAD FINDINGS Brain: The ventricles and sulci  appropriate size for patient's age. The gray-white matter discrimination is preserved. There is no acute intracranial hemorrhage. No mass effect or midline shift. No extra-axial fluid collection. Vascular: No hyperdense vessel or unexpected calcification. Skull: Normal. Negative for fracture or focal lesion. Sinuses/Orbits: No acute finding. Other: None. CT CERVICAL SPINE FINDINGS Alignment: No acute subluxation. There is straightening of normal cervical lordosis per Skull base and vertebrae: No acute fracture. Soft tissues and spinal canal: No prevertebral fluid or swelling. No visible canal hematoma. Disc levels: No acute findings. No significant degenerative changes. Upper chest: Paraseptal emphysema. Other: None IMPRESSION: 1. Normal noncontrast CT of the brain. 2. No acute/traumatic cervical spine pathology. Electronically Signed   By: Anner Crete M.D.   On: 07/10/2019 15:14   DG Foot 2 Views Left  Result Date: 07/10/2019 CLINICAL DATA:  Seizure and fall EXAM: LEFT FOOT - 2 VIEW COMPARISON:  None. FINDINGS: There is no evidence of fracture or dislocation. There is no evidence of arthropathy or other focal bone abnormality. Soft tissues are unremarkable. IMPRESSION: Negative. Electronically Signed   By: Donavan Foil M.D.   On: 07/10/2019 17:26   DG Foot 2 Views Right  Result Date: 07/10/2019 CLINICAL DATA:  Seizure and fall EXAM: RIGHT FOOT - 2 VIEW COMPARISON:  None. FINDINGS: There is no evidence of fracture or dislocation. There is no evidence of arthropathy or other focal bone abnormality. Soft tissues are unremarkable. IMPRESSION: Negative. Electronically Signed   By: Donavan Foil M.D.   On: 07/10/2019 17:25    Procedures Procedures (including critical care time)  Medications Ordered in ED Medications  sodium chloride 0.9 % bolus 1,000 mL (0 mLs Intravenous Stopped 07/10/19 1817)  levETIRAcetam (KEPPRA) IVPB 1000 mg/100 mL premix (0 mg Intravenous Stopped 07/10/19 1818)  fentaNYL  (SUBLIMAZE) injection 50 mcg (50 mcg Intravenous Given 07/10/19 1427)  Tdap (BOOSTRIX) injection 0.5 mL (0.5 mLs Intramuscular Given 07/10/19 1559)    ED Course  I have reviewed  the triage vital signs and the nursing notes.  Pertinent labs & imaging results that were available during my care of the patient were reviewed by me and considered in my medical decision making (see chart for details).    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  No sign of ICH or fracture on CT head and CSpine.  Given 1g of Keppra. Returned to baseline. No acute findings on labs.    Poorly controlled epilepsy however not on very high dose medication at this time. Discussed with Dr. Lorraine Lax. Will increase Keppra to 1500 BID and topamax to 100 nightly and have her follow up with [facility]. XR feet pending at time of transfer of care.  Plan for discharge with Neurology follow up and increased medications. Discussed not driving for 6 mos.    Final Clinical Impression(s) / ED Diagnoses Final diagnoses:  Seizure ([place])    Rx / DC Orders ED Discharge Orders         Ordered    levETIRAcetam (KEPPRA) 500 MG tablet  2 times daily     Discontinue  Reprint     07/10/19 1647    topiramate (TOPAMAX) 50 MG tablet  Nightly     Discontinue  Reprint     07/10/19 1647           Gareth Morgan, MD 07/11/19 1342

## 2019-07-10 NOTE — ED Notes (Signed)
ED Provider at bedside. 

## 2019-07-10 NOTE — Progress Notes (Signed)
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 Peritoneal, omental biopsies negative. EMB - degenerating secretory endometrium. Pelvic washings negative.    Interval History: Patient reports overall doing well from a gynecologic standpoint.  She notes that her appetite has continued to improve although is not yet back to normal.  She denies any nausea or emesis.  She denies early satiety.  She reports regular bowel and bladder function.  She denies any pelvic or abdominal pain.  She has had regular menses every 3 to 4 weeks which she is tracking now on an online application.  Her periods are lasting about a week.  She denies any intermenstrual bleeding.  Unfortunately, her epilepsy has been poorly controlled recently.  She has had multiple ED visits for seizures and 3 admissions, most recently discharged on 6/20.  Her Keppra dose was increased from 500 to 1000 mg on the 15th.  She is overall tired and sore.  Last pap test was 11/2018 - negative  Past Medical/Surgical History: Past Medical History:  Diagnosis Date  . Asthma   . Complication of anesthesia    "I wake up during anesthesia" (10/14/2015)  . DVT (deep vein thrombosis) in pregnancy   . Epilepsy ([place])   . GERD (gastroesophageal reflux disease)   . Heart murmur    last work up age 37- no symtoms  . Pancreatitis   . Sickle cell trait ([place])   . Sleep apnea    "used to wear CPAP; don't have one here in [place] since I moved in 2014" (10/14/2015)    Past Surgical History:  Procedure Laterality Date  . APPENDECTOMY    . CESAREAN SECTION  2013  . CESAREAN SECTION N/A 11/08/2014   Procedure: CESAREAN  SECTION;  Surgeon: Truett Mainland, DO;  Location: [facility];  Service: Obstetrics;  Laterality: N/A;  . CHOLECYSTECTOMY N/A 10/16/2015   Procedure: LAPAROSCOPIC CHOLECYSTECTOMY WITH POSSIBLE INTRAOPERATIVE CHOLANGIOGRAM;  Surgeon: Donnie Mesa, MD;  Location: [facility];  Service: General;  Laterality: N/A;  . CLOSED REDUCTION MANDIBLE WITH MANDIBULOMA N/A 07/17/2017   Procedure: REDUCTION TMJ WITH INTRAMAXILLARY FIXATION;  Surgeon: Diona Browner, DDS;  Location: [facility];  Service: Oral Surgery;  Laterality: N/A;  . ESOPHAGOGASTRODUODENOSCOPY (EGD) WITH PROPOFOL N/A 11/15/2016   Procedure: ESOPHAGOGASTRODUODENOSCOPY (EGD) WITH PROPOFOL;  Surgeon: Clarene Essex, MD;  [facility];  Service: Endoscopy;  Laterality: N/A;  . INGUINAL HERNIA REPAIR Bilateral ~ 1996  . NERVE, TENDON AND ARTERY REPAIR Right 09/23/2012   Procedure: I&D and Repair As Necessary/Right Hand and Palm;  Surgeon: Roseanne Kaufman, MD;  Location: [facility];  Service: Orthopedics;  Laterality: Right;  . OMENTECTOMY N/A 12/25/2018   Procedure: OMENTECTOMY;  Surgeon: Lafonda Mosses, MD;  [facility];  Service: Gynecology;  Laterality: N/A;  . SALPINGOOPHORECTOMY N/A 12/25/2018   Procedure: OPEN UNILATERAL  SALPINGO OOPHORECTOMY WITH STAGING;  Surgeon: Lafonda Mosses, MD;  [facility];  Service: Gynecology;  Laterality: N/A;  . TONSILLECTOMY      Family History  Problem Relation Age of Onset  . Cancer Mother        cervical cancer  . Asthma Mother   . Hypertension Father   . Sickle cell anemia Father   . Asthma Sister   .  Diabetes Maternal Aunt   . Lymphoma Maternal Grandmother     Social History   Socioeconomic History  . Marital status: Single    Spouse name: Not on file  . Number of children: Not on file  . Years of education: Not on file  . Highest education level: Not on file  Occupational History  . Not on file  Tobacco Use  . Smoking status: Former Smoker    Packs/day: 0.25    Years: 4.00     Pack years: 1.00    Types: Cigarettes    Quit date: 10/02/2015    Years since quitting: 3.7  . Smokeless tobacco: Never Used  Vaping Use  . Vaping Use: Never used  Substance and Sexual Activity  . Alcohol use: No    Alcohol/week: 0.0 standard drinks  . Drug use: No  . Sexual activity: Yes    Birth control/protection: None  Other Topics Concern  . Not on file  Social History Narrative  . Not on file   Social Determinants of Health   Financial Resource Strain:   . Difficulty of Paying Living Expenses:   Food Insecurity:   . Worried About Charity fundraiser in the Last Year:   . Arboriculturist in the Last Year:   Transportation Needs:   . Film/video editor (Medical):   Marland Kitchen Lack of Transportation (Non-Medical):   Physical Activity:   . Days of Exercise per Week:   . Minutes of Exercise per Session:   Stress:   . Feeling of Stress :   Social Connections:   . Frequency of Communication with Friends and Family:   . Frequency of Social Gatherings with Friends and Family:   . Attends Religious Services:   . Active Member of Clubs or Organizations:   . Attends Archivist Meetings:   Marland Kitchen Marital Status:     Current Medications:  Current Outpatient Medications:  .  albuterol (PROVENTIL HFA;VENTOLIN HFA) 108 (90 Base) MCG/ACT inhaler, Inhale 2 puffs into the lungs every 4 (four) hours as needed for wheezing or shortness of breath., Disp: 1 Inhaler, Rfl: 3 .  levETIRAcetam (KEPPRA) 1000 MG tablet, Take 1 tablet (1,000 mg total) by mouth 2 (two) times daily., Disp: 60 tablet, Rfl: 0 .  SUMAtriptan (IMITREX) 50 MG tablet, Take 1 tablet (50 mg total) by mouth daily. May take one more tablet two hours after the first. No more than two tablets per day., Disp: 20 tablet, Rfl: 1 .  topiramate (TOPAMAX) 50 MG tablet, Take 1 tablet (50 mg total) by mouth at bedtime. (Patient not taking: Reported on 03/28/2019), Disp: 30 tablet, Rfl: 3  Review of Systems: Reports chills,  fatigue, joint pain, rash Denies appetite changes, fevers, unexplained weight changes. Denies hearing loss, neck lumps or masses, mouth sores, ringing in ears or voice changes. Denies cough or wheezing.  Denies shortness of breath. Denies chest pain or palpitations. Denies leg swelling. Denies abdominal distention, pain, blood in stools, constipation, diarrhea, nausea, vomiting, or early satiety. Denies pain with intercourse, dysuria, frequency, hematuria or incontinence. Denies hot flashes, pelvic pain, vaginal bleeding or vaginal discharge.   Denies back pain or muscle pain/cramps. Denies itching or wounds. Denies dizziness, headaches, numbness or seizures. Denies swollen lymph nodes or glands, denies easy bruising or bleeding. Denies anxiety, depression, confusion, or decreased concentration.  Physical Exam: BP 123/69 (BP Location: Right Arm, Patient Position: Sitting)   Pulse 90   Temp 99.2 F (37.3 C) (Oral)  Resp 18   Ht 5\' 4"  (1.626 m)   Wt 151 lb 3.2 oz (68.6 kg)   SpO2 100%   BMI 25.95 kg/m  General: Alert, oriented, no acute distress. HEENT: Normocephalic, atraumatic, sclera anicteric. Chest: Clear to auscultation bilaterally.  No wheezes or rhonchi. Cardiovascular: Regular rate and rhythm, no murmurs. Abdomen: soft, nontender.  Normoactive bowel sounds.  No masses or hepatosplenomegaly appreciated.  Well-healed scar. Extremities: Grossly normal range of motion.  Warm, well perfused.  No edema bilaterally. Skin: No lesions noted. Lymphatics: No cervical, supraclavicular, or inguinal adenopathy. GU: Normal appearing external genitalia without erythema, excoriation, or lesions.  Speculum exam deferred today.  Bimanual exam reveals small mobile uterus, no adnexal masses.  No nodularity.  Laboratory & Radiologic Studies: CEA pending  Assessment & Plan: Jasmine [name] is a 29 y.o. 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  Patient is over all doing well from a gynecologic standpoint and is NED on exam today.  Unfortunately, her epilepsy has been poorly controlled and she has had numerous visits to the emergency department some requiring admission in the last 6 months.  She presented late for her ultrasound appointment today and my office will work on getting this rescheduled.  I will release the results to her on my chart.  We will continue with every 6 months with a pelvic exam, ultrasound and CEA.    We reviewed signs and symptoms that should prompt a phone call prior to her next scheduled visit and that would be concerning for recurrence.  I will see the patient back in December for her next surveillance visit with a repeat CEA and ultrasound.  20 minutes of total time was spent for this patient encounter, including preparation, face-to-face counseling with the patient and coordination of care, and documentation of the encounter.  Jeral Pinch, MD  Division of Gynecologic Oncology  Department of Obstetrics and Gynecology  [facility]

## 2019-07-10 NOTE — Patient Instructions (Signed)
I will release your ultrasound results to you in mychart.  Your exam is overall reassuring. I will see you in 6 months for check up. If you develop any new symptoms prior to that time, please call [phone] to be seen sooner.

## 2019-07-14 ENCOUNTER — Encounter (HOSPITAL_COMMUNITY): Payer: Self-pay

## 2019-07-14 ENCOUNTER — Ambulatory Visit (HOSPITAL_COMMUNITY): Payer: Medicaid Other

## 2019-07-16 ENCOUNTER — Telehealth: Payer: Self-pay | Admitting: *Deleted

## 2019-07-16 DIAGNOSIS — R2689 Other abnormalities of gait and mobility: Secondary | ICD-10-CM | POA: Diagnosis not present

## 2019-07-16 DIAGNOSIS — J45901 Unspecified asthma with (acute) exacerbation: Secondary | ICD-10-CM | POA: Diagnosis not present

## 2019-07-16 DIAGNOSIS — G4739 Other sleep apnea: Secondary | ICD-10-CM | POA: Diagnosis not present

## 2019-07-16 DIAGNOSIS — J45909 Unspecified asthma, uncomplicated: Secondary | ICD-10-CM | POA: Diagnosis not present

## 2019-07-16 NOTE — Telephone Encounter (Signed)
Called and left the patient a message to call the office back. Need to schedule her [place]

## 2019-07-17 NOTE — Telephone Encounter (Signed)
Called and left the patient a message to call the office; need to r/s her [place] scan

## 2019-07-22 ENCOUNTER — Telehealth: Payer: Self-pay | Admitting: *Deleted

## 2019-07-22 NOTE — Telephone Encounter (Signed)
Called and rescheduled the [place] for 7/9 at 11:30 am. Called and gave the patient the information

## 2019-07-24 ENCOUNTER — Ambulatory Visit (HOSPITAL_COMMUNITY): Admission: RE | Admit: 2019-07-24 | Payer: Medicaid Other | Source: Ambulatory Visit

## 2019-07-27 ENCOUNTER — Telehealth: Payer: Self-pay | Admitting: *Deleted

## 2019-07-27 NOTE — Telephone Encounter (Signed)
Called and gave the patient the number to central scheduling to schedule her [place]

## 2019-07-29 ENCOUNTER — Emergency Department (HOSPITAL_COMMUNITY): Payer: Medicaid Other

## 2019-07-29 ENCOUNTER — Other Ambulatory Visit: Payer: Self-pay

## 2019-07-29 ENCOUNTER — Encounter (HOSPITAL_COMMUNITY): Payer: Self-pay | Admitting: Emergency Medicine

## 2019-07-29 ENCOUNTER — Emergency Department (HOSPITAL_COMMUNITY)
Admission: EM | Admit: 2019-07-29 | Discharge: 2019-07-29 | Disposition: A | Discharge status: Home / Self Care | Payer: Medicaid Other | Attending: Emergency Medicine | Admitting: Emergency Medicine

## 2019-07-29 DIAGNOSIS — Z7951 Long term (current) use of inhaled steroids: Secondary | ICD-10-CM | POA: Insufficient documentation

## 2019-07-29 DIAGNOSIS — S0990XA Unspecified injury of head, initial encounter: Secondary | ICD-10-CM | POA: Diagnosis not present

## 2019-07-29 DIAGNOSIS — R569 Unspecified convulsions: Secondary | ICD-10-CM | POA: Insufficient documentation

## 2019-07-29 DIAGNOSIS — G40909 Epilepsy, unspecified, not intractable, without status epilepticus: Secondary | ICD-10-CM | POA: Diagnosis not present

## 2019-07-29 DIAGNOSIS — Z91040 Latex allergy status: Secondary | ICD-10-CM | POA: Diagnosis not present

## 2019-07-29 DIAGNOSIS — J45901 Unspecified asthma with (acute) exacerbation: Secondary | ICD-10-CM | POA: Diagnosis not present

## 2019-07-29 DIAGNOSIS — Z87891 Personal history of nicotine dependence: Secondary | ICD-10-CM | POA: Insufficient documentation

## 2019-07-29 DIAGNOSIS — S0993XA Unspecified injury of face, initial encounter: Secondary | ICD-10-CM | POA: Diagnosis not present

## 2019-07-29 DIAGNOSIS — R4182 Altered mental status, unspecified: Secondary | ICD-10-CM | POA: Diagnosis present

## 2019-07-29 LAB — BASIC METABOLIC PANEL
Anion gap: 7 (ref 5–15)
BUN: 7 mg/dL (ref 6–20)
CO2: 25 mmol/L (ref 22–32)
Calcium: 8.7 mg/dL — ABNORMAL LOW (ref 8.9–10.3)
Chloride: 105 mmol/L (ref 98–111)
Creatinine, Ser: 0.67 mg/dL (ref 0.44–1.00)
GFR calc Af Amer: >60 mL/min (ref >60)
GFR calc non Af Amer: >60 mL/min (ref >60)
Glucose, Bld: 79 mg/dL (ref 70–99)
Potassium: 3.3 mmol/L — ABNORMAL LOW (ref 3.5–5.1)
Sodium: 137 mmol/L (ref 135–145)

## 2019-07-29 LAB — CBC WITH DIFFERENTIAL/PLATELET
Abs Immature Granulocytes: 0.04 10*3/uL (ref 0.00–0.07)
Basophils Absolute: 0.1 10*3/uL (ref 0.0–0.1)
Basophils Relative: 0 %
Eosinophils Absolute: 0.2 10*3/uL (ref 0.0–0.5)
Eosinophils Relative: 1 %
HCT: 32.9 % — ABNORMAL LOW (ref 36.0–46.0)
Hemoglobin: 10 g/dL — ABNORMAL LOW (ref 12.0–15.0)
Immature Granulocytes: 0 %
Lymphocytes Relative: 21 %
Lymphs Abs: 3 10*3/uL (ref 0.7–4.0)
MCH: 23 pg — ABNORMAL LOW (ref 26.0–34.0)
MCHC: 30.4 g/dL (ref 30.0–36.0)
MCV: 75.8 fL — ABNORMAL LOW (ref 80.0–100.0)
Monocytes Absolute: 0.6 10*3/uL (ref 0.1–1.0)
Monocytes Relative: 4 %
Neutro Abs: 10.5 10*3/uL — ABNORMAL HIGH (ref 1.7–7.7)
Neutrophils Relative %: 74 %
Platelets: 389 10*3/uL (ref 150–400)
RBC: 4.34 MIL/uL (ref 3.87–5.11)
RDW: 18 % — ABNORMAL HIGH (ref 11.5–15.5)
WBC: 14.4 10*3/uL — ABNORMAL HIGH (ref 4.0–10.5)
nRBC: 0 % (ref 0.0–0.2)

## 2019-07-29 LAB — CBG MONITORING, ED: Glucose-Capillary: 85 mg/dL (ref 70–99)

## 2019-07-29 LAB — I-STAT BETA HCG BLOOD, ED (MC, WL, AP ONLY): I-stat hCG, quantitative: <5 m[IU]/mL (ref <5)

## 2019-07-29 LAB — ETHANOL: Alcohol, Ethyl (B): <10 mg/dL (ref <10)

## 2019-07-29 MED ORDER — LEVETIRACETAM IN NACL 1500 MG/100ML IV SOLN
1500.0000 mg | Freq: Once | INTRAVENOUS | Status: AC
  Administered 2019-07-29: 1500 mg via INTRAVENOUS
  Filled 2019-07-29: qty 100

## 2019-07-29 MED ORDER — TOPIRAMATE 25 MG PO TABS
100.0000 mg | ORAL_TABLET | Freq: Once | ORAL | Status: AC
  Administered 2019-07-29: 100 mg via ORAL
  Filled 2019-07-29: qty 4

## 2019-07-29 MED ORDER — TOPIRAMATE 100 MG PO TABS
100.0000 mg | ORAL_TABLET | Freq: Every day | ORAL | 0 refills | Status: DC
Start: 2019-07-29 — End: 2019-08-04

## 2019-07-29 NOTE — ED Notes (Signed)
Attempted to call SO, voicemail not set up

## 2019-07-29 NOTE — ED Provider Notes (Signed)
[facility] EMERGENCY DEPARTMENT Provider Note   CSN: [id number] Arrival date & time: 07/29/19  1815     History Chief Complaint  Patient presents with  . Altered Mental Status    Jasmine [name] is a 29 y.o. female with history of epilepsy and poorly controlled seizures who presents with a breakthrough seizure. She ran out of her Topamax 2 days ago. She has been taking her Keppra 1500mg  twice daily. She states that she has a left sided headache from the seizure today. She took a Lyft to the hospital when she woke up. She was noted to be post-ictal in triage. Otherwise she has been doing well. No recent fever, chest pain, SOB, abdominal pain, N/V/D, urinary symptoms. No ETOH use. She does smoke marijuana. She is waiting for a call from [facility] to get an appointment with them.  HPI     Past Medical History:  Diagnosis Date  . Asthma   . Complication of anesthesia    "I wake up during anesthesia" (10/14/2015)  . DVT (deep vein thrombosis) in pregnancy   . Epilepsy ([place])   . GERD (gastroesophageal reflux disease)   . Heart murmur    last work up age 82- no symtoms  . Pancreatitis   . Sickle cell trait ([place])   . Sleep apnea    "used to wear CPAP; don't have one here in [place] since I moved in 2014" (10/14/2015)    Patient Active Problem List   Diagnosis Date Noted  . Status epilepticus ([place]) 03/28/2019  . Moderate persistent asthma 03/19/2019  . 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 Pelvic mass in female 12/25/2018  . Abnormal uterine bleeding   . Pelvic mass 12/22/2018  . Dehydration 12/22/2018  . Asthma exacerbation 12/01/2017  . Acute hypercapnic respiratory failure ([place]) 12/01/2017  . Exocrine pancreatic insufficiency 12/01/2017  . Facial contusion 07/17/2017  . Closed dislocation of right jaw 07/17/2017  . Nausea with hives 07/09/2017  . History of DVT (deep vein thrombosis) in  pregnancy ([place]) 05/29/2017  . Sickle cell trait ([place]) 05/29/2017  . Acute asthma exacerbation 05/29/2017  . Chronic pancreatitis ([place]) 05/29/2017  . Seizures ([place]) 05/29/2017  . Tobacco abuse 05/29/2017  . Acute on chronic respiratory failure with hypoxemia ([place]) 05/29/2017  . Pneumonia 05/04/2017  . SOB (shortness of breath) 04/08/2017  . Cough 04/07/2017  . 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 Acute on chronic respiratory failure with hypoxia ([place]) 03/10/2017  . Colitis 11/12/2016  . Seizure ([place]) 11/12/2016  . Calculus of bile duct with acute cholecystitis with obstruction 10/14/2015  . Adjustment disorder with mixed anxiety and depressed mood 04/26/2015  . Low blood potassium 04/19/2015  . Asthma with acute exacerbation 04/19/2015  . Asthma 01/03/2015  . Asthma, chronic, unspecified asthma severity, with acute exacerbation 01/03/2015  . Influenza-like illness 01/03/2015  . Status post cesarean section 11/08/2014  . Previous cesarean section complicating pregnancy, antepartum condition or complication   . GERD (gastroesophageal reflux disease)   . Gastroesophageal reflux disease without esophagitis   . Abnormal biochemical finding on antenatal screening of mother   . Abnormal quad screen   . Echogenic focus of heart of fetus affecting antepartum care of mother   . Drug use affecting pregnancy, antepartum 05/19/2014  . Seizure disorder during pregnancy, antepartum ([place]) 05/13/2014  . Supervision of high risk pregnancy, antepartum 05/13/2014  . Asthma complicating pregnancy, antepartum 05/13/2014  . History of food anaphylaxis 05/13/2014  .  Hypokalemia 04/08/2014  . Seizure disorder, sept 2015 last seizure 01/19/2013  . Microcytic anemia 01/19/2013  . Leukocytosis 01/19/2013    Past Surgical History:  Procedure Laterality Date  . APPENDECTOMY    . CESAREAN SECTION  2013  . CESAREAN SECTION N/A 11/08/2014   Procedure: CESAREAN SECTION;  Surgeon: Truett Mainland, DO;   Location: [facility];  Service: Obstetrics;  Laterality: N/A;  . CHOLECYSTECTOMY N/A 10/16/2015   Procedure: LAPAROSCOPIC CHOLECYSTECTOMY WITH POSSIBLE INTRAOPERATIVE CHOLANGIOGRAM;  Surgeon: Donnie Mesa, MD;  Location: [facility];  Service: General;  Laterality: N/A;  . CLOSED REDUCTION MANDIBLE WITH MANDIBULOMA N/A 07/17/2017   Procedure: REDUCTION TMJ WITH INTRAMAXILLARY FIXATION;  Surgeon: Diona Browner, DDS;  Location: [facility];  Service: Oral Surgery;  Laterality: N/A;  . ESOPHAGOGASTRODUODENOSCOPY (EGD) WITH PROPOFOL N/A 11/15/2016   Procedure: ESOPHAGOGASTRODUODENOSCOPY (EGD) WITH PROPOFOL;  Surgeon: Clarene Essex, MD;  Location: [facility];  Service: Endoscopy;  Laterality: N/A;  . INGUINAL HERNIA REPAIR Bilateral ~ 1996  . NERVE, TENDON AND ARTERY REPAIR Right 09/23/2012   Procedure: I&D and Repair As Necessary/Right Hand and Palm;  Surgeon: Roseanne Kaufman, MD;  Location: [facility];  Service: Orthopedics;  Laterality: Right;  . OMENTECTOMY N/A 12/25/2018   Procedure: OMENTECTOMY;  Surgeon: Lafonda Mosses, MD;  Location: [facility];  Service: Gynecology;  Laterality: N/A;  . SALPINGOOPHORECTOMY N/A 12/25/2018   Procedure: OPEN UNILATERAL  SALPINGO OOPHORECTOMY WITH STAGING;  Surgeon: Lafonda Mosses, MD;  Location: [facility];  Service: Gynecology;  Laterality: N/A;  . TONSILLECTOMY       OB History    Gravida  3   Para  3   Term  3   Preterm      AB      Living  3     SAB      TAB      Ectopic      Multiple  0   Live Births  3           Family History  Problem Relation Age of Onset  . Cancer Mother        cervical cancer  . Asthma Mother   . Hypertension Father   . Sickle cell anemia Father   . Asthma Sister   . Diabetes Maternal Aunt   . Lymphoma Maternal Grandmother     Social History   Tobacco Use  . Smoking status: Former Smoker    Packs/day: 0.25    Years: 4.00    Pack years: 1.00    Types: Cigarettes    Quit date: 10/02/2015    Years since quitting: 3.8    . Smokeless tobacco: Never Used  Vaping Use  . Vaping Use: Never used  Substance Use Topics  . Alcohol use: No    Alcohol/week: 0.0 standard drinks  . Drug use: No    Home Medications Prior to Admission medications   Medication Sig Start Date End Date Taking? Authorizing Provider  albuterol (PROVENTIL HFA;VENTOLIN HFA) 108 (90 Base) MCG/ACT inhaler Inhale 2 puffs into the lungs every 4 (four) hours as needed for wheezing or shortness of breath. 08/20/17   Clent Demark, PA-C  levETIRAcetam (KEPPRA) 500 MG tablet Take 3 tablets (1,500 mg total) by mouth 2 (two) times daily. 07/10/19 08/09/19  Gareth Morgan, MD  SUMAtriptan (IMITREX) 50 MG tablet Take 1 tablet (50 mg total) by mouth daily. May take one more tablet two hours after the first. No more than two tablets per day. 01/06/18  Gildardo Pounds, NP  topiramate (TOPAMAX) 50 MG tablet Take 2 tablets (100 mg total) by mouth at bedtime. 07/10/19   Gareth Morgan, MD  cetirizine (ZYRTEC) 10 MG tablet Take 1 tablet (10 mg total) by mouth daily. Patient not taking: Reported on 03/28/2019 12/11/17 05/30/19  Ok Edwards, PA-C    Allergies    Cheese, Chocolate, Ibuprofen, Ivp dye [iodinated diagnostic agents], Latex, Orange juice [orange oil], Other, Peach [prunus persica], Peanuts [peanut oil], Pear, Prednisone, Raspberry, Tylenol [acetaminophen], Amoxicillin, Apricot flavor, Doxycycline, Erythromycin, Milk of magnesia [magnesium hydroxide], and Penicillins  Review of Systems   Review of Systems  Constitutional: Negative for chills and fever.  Eyes: Positive for photophobia.  Respiratory: Negative for shortness of breath.   Cardiovascular: Negative for chest pain.  Gastrointestinal: Negative for abdominal pain.  Neurological: Positive for seizures and headaches.  All other systems reviewed and are negative.   Physical Exam Updated Vital Signs BP 127/89 (BP Location: Left Arm)   Pulse (!) 109   Temp 98.7 F (37.1 C)   Resp 20    SpO2 100%   Physical Exam Vitals and nursing note reviewed.  Constitutional:      General: She is not in acute distress.    Appearance: Normal appearance. She is well-developed. She is not ill-appearing.     Comments: Fatigued appearing. Photophobic  HENT:     Head: Normocephalic and atraumatic.  Eyes:     General: No scleral icterus.       Right eye: No discharge.        Left eye: No discharge.     Conjunctiva/sclera: Conjunctivae normal.     Pupils: Pupils are equal, round, and reactive to light.  Cardiovascular:     Rate and Rhythm: Normal rate and regular rhythm.  Pulmonary:     Effort: Pulmonary effort is normal. No respiratory distress.     Breath sounds: Normal breath sounds.  Abdominal:     General: There is no distension.  Musculoskeletal:     Cervical back: Normal range of motion.  Skin:    General: Skin is warm and dry.  Neurological:     Mental Status: She is alert and oriented to person, place, and time.     Comments: Lying on stretcher in NAD. GCS 15. Speaks in a clear voice. Cranial nerves II through XII grossly intact. 5/5 strength in all extremities. Sensation fully intact.  Gait not tested   Psychiatric:        Behavior: Behavior normal.     ED Results / Procedures / Treatments   Labs (all labs ordered are listed, but only abnormal results are displayed) Labs Reviewed  BASIC METABOLIC PANEL - Abnormal; Notable for the following components:      Result Value   Potassium 3.3 (*)    Calcium 8.7 (*)    All other components within normal limits  CBC WITH DIFFERENTIAL/PLATELET - Abnormal; Notable for the following components:   WBC 14.4 (*)    Hemoglobin 10.0 (*)    HCT 32.9 (*)    MCV 75.8 (*)    MCH 23.0 (*)    RDW 18.0 (*)    Neutro Abs 10.5 (*)    All other components within normal limits  ETHANOL  LEVETIRACETAM LEVEL  CBG MONITORING, ED  I-STAT BETA HCG BLOOD, ED ([facility], [facility], [facility] ONLY)  I-STAT BETA HCG BLOOD, ED ([facility], [facility], [facility] ONLY)     EKG None  Radiology CT Head Wo Contrast  Result  Date: 07/29/2019 CLINICAL DATA:  Seizure, nontraumatic. Additional provided: Hit left side of face and head. EXAM: CT HEAD WITHOUT CONTRAST CT MAXILLOFACIAL WITHOUT CONTRAST TECHNIQUE: Multidetector CT imaging of the head and maxillofacial structures were performed using the standard protocol without intravenous contrast. Multiplanar CT image reconstructions of the maxillofacial structures were also generated. COMPARISON:  Prior head CT examinations 07/10/2019 and earlier, brain MRI 03/28/2019, maxillofacial CT 07/26/2017. FINDINGS: CT HEAD FINDINGS Brain: Cerebral volume is normal. There is no acute intracranial hemorrhage. No demarcated cortical infarct. No extra-axial fluid collection. No evidence of intracranial mass. No midline shift. Redemonstrated partially empty sella turcica. Vascular: No hyperdense vessel. Skull: Normal. Negative for fracture or focal lesion. CT MAXILLOFACIAL FINDINGS Osseous: No acute maxillofacial fracture there is anterior translation of the right mandibular condyle, which is also present on prior maxillofacial CT 07/26/2017. Orbits: No acute abnormality. The globes are normal in size and contour. The extraocular muscles and optic nerve sheath complexes are symmetric and unremarkable. Sinuses: Small left frontal sinus mucous retention cyst. No significant mastoid effusion. Soft tissues: The visualized maxillofacial and upper neck soft tissues are unremarkable. IMPRESSION: CT head: Stable non-contrast CT appearance of the brain. No evidence of acute intracranial abnormality. CT maxillofacial: 1. No evidence of acute maxillofacial fracture. 2. Anterior subluxation of the right mandibular condyle. This finding was also present on prior maxillofacial CT 07/26/2017. 3. Redemonstrated small left frontal sinus mucous retention cyst. Electronically Signed   By: Kellie Simmering DO   On: 07/29/2019 20:29   CT Maxillofacial Wo  Contrast  Result Date: 07/29/2019 CLINICAL DATA:  Seizure, nontraumatic. Additional provided: Hit left side of face and head. EXAM: CT HEAD WITHOUT CONTRAST CT MAXILLOFACIAL WITHOUT CONTRAST TECHNIQUE: Multidetector CT imaging of the head and maxillofacial structures were performed using the standard protocol without intravenous contrast. Multiplanar CT image reconstructions of the maxillofacial structures were also generated. COMPARISON:  Prior head CT examinations 07/10/2019 and earlier, brain MRI 03/28/2019, maxillofacial CT 07/26/2017. FINDINGS: CT HEAD FINDINGS Brain: Cerebral volume is normal. There is no acute intracranial hemorrhage. No demarcated cortical infarct. No extra-axial fluid collection. No evidence of intracranial mass. No midline shift. Redemonstrated partially empty sella turcica. Vascular: No hyperdense vessel. Skull: Normal. Negative for fracture or focal lesion. CT MAXILLOFACIAL FINDINGS Osseous: No acute maxillofacial fracture there is anterior translation of the right mandibular condyle, which is also present on prior maxillofacial CT 07/26/2017. Orbits: No acute abnormality. The globes are normal in size and contour. The extraocular muscles and optic nerve sheath complexes are symmetric and unremarkable. Sinuses: Small left frontal sinus mucous retention cyst. No significant mastoid effusion. Soft tissues: The visualized maxillofacial and upper neck soft tissues are unremarkable. IMPRESSION: CT head: Stable non-contrast CT appearance of the brain. No evidence of acute intracranial abnormality. CT maxillofacial: 1. No evidence of acute maxillofacial fracture. 2. Anterior subluxation of the right mandibular condyle. This finding was also present on prior maxillofacial CT 07/26/2017. 3. Redemonstrated small left frontal sinus mucous retention cyst. Electronically Signed   By: Kellie Simmering DO   On: 07/29/2019 20:29    Procedures Procedures (including critical care time)  Medications  Ordered in ED Medications  levETIRAcetam (KEPPRA) IVPB 1500 mg/ 100 mL premix (0 mg Intravenous Stopped 07/29/19 2114)  topiramate (TOPAMAX) tablet 100 mg (100 mg Oral Given 07/29/19 2056)    ED Course  I have reviewed the triage vital signs and the nursing notes.  Pertinent labs & imaging results that were available during my care of the patient  were reviewed by me and considered in my medical decision making (see chart for details).  29 year old female presents with a break through seizure. She has been off her Topamax for 2 days but reports compliance with her Keppra. She was noted to be post-ictal in triage. She is alert but fatigued on my exam. Labs show leukocytosis of 14 with anemia (hgb 10) which is similar to prior. She has mild hypokalemia (3.3). CT head and max face were ordered in triage and do not show any acute injury. She was given IV Keppra and dose of Topamax here. She was monitored for 4 hours and is without another seizure episode. Will rx a refill for her and advised f/u with Neurology. Pt is comfortable with plan.  MDM Rules/Calculators/A&P                           Final Clinical Impression(s) / ED Diagnoses Final diagnoses:  Seizure [facility])    Rx / [place] Orders ED Discharge Orders    None       Recardo Evangelist, PA-C 07/30/19 1749    Daleen Bo, MD 08/01/19 [phone]

## 2019-07-29 NOTE — ED Notes (Signed)
Attempted IV x2, second RN to attempt

## 2019-07-29 NOTE — ED Triage Notes (Signed)
Pt arrives to triage room- unaccompanied and confused- pt has a startled look in her eye and will not speak. Pt is looking around like she does not know where she is, no obvious trauma.

## 2019-07-29 NOTE — Discharge Instructions (Addendum)
Please continue Keppra and Topamax - you have received your doses for tonight already so your next dose will be tomorrow morning Follow up with [facility] Return to the ER if worsening

## 2019-07-29 NOTE — ED Provider Notes (Signed)
Patient placed in [facility] pathway, seen and evaluated   Chief Complaint: Seizure  HPI:   States she had a seizure in her kitchen today just prior to arrival. Used a ride share app to get herself here.  Out of her topiramate for the last week. Has been compliant with her Keppra.  Hit left side of her head and face.  Denies neuro deficits, recent illness, neck/back pain.  ROS: Seizure (one)  Physical Exam:   Gen: No distress  Neuro: Awake and Alert  Skin: Warm    Focused Exam:   A&Ox4 upon my assessment.  Tremors. Some abrasion to right side of tongue, no other intraoral abnormalities noted. Tenderness to left forehead without swelling, deformity, wounds. Tenderness and pain to left mandible without deformity, swelling. No neck or back tenderness.  Cranial nerves III-XII grossly intact.   Initiation of care has begun. The patient has been counseled on the process, plan, and necessity for staying for the completion/evaluation, and the remainder of the medical screening examination   Layla Maw 07/29/19 [place], [place], DO 07/29/19 2232

## 2019-07-29 NOTE — ED Notes (Signed)
Spoke with SO who states patient is out of her seizure meds, took a Lyft by herself to get to hospital

## 2019-07-29 NOTE — ED Notes (Signed)
Discharge instructions discussed with pt. Pt verbalized understanding with no questions at this time. Pt given heat pack. Pt to go home with husband. Ambulatory at discharge.

## 2019-07-30 ENCOUNTER — Telehealth: Payer: Self-pay | Admitting: *Deleted

## 2019-07-30 DIAGNOSIS — R569 Unspecified convulsions: Secondary | ICD-10-CM

## 2019-07-30 NOTE — Telephone Encounter (Signed)
Returned pt's call; left message of voicemail.  Lenor Coffin, RN, BSN, [place] Patient [place] [phone]

## 2019-07-30 NOTE — Telephone Encounter (Signed)
Attempted to contact pt to complete transition of care assessment; left message on voicemail; order placed for community care coordination.  Lenor Coffin, RN, BSN, [place] Patient No Name [phone]

## 2019-08-02 LAB — LEVETIRACETAM LEVEL: Levetiracetam Lvl: <1 ug/mL — ABNORMAL LOW (ref 10.0–40.0)

## 2019-08-04 ENCOUNTER — Encounter: Payer: Self-pay | Admitting: Diagnostic Neuroimaging

## 2019-08-04 ENCOUNTER — Ambulatory Visit (INDEPENDENT_AMBULATORY_CARE_PROVIDER_SITE_OTHER): Payer: Medicaid Other | Admitting: Diagnostic Neuroimaging

## 2019-08-04 VITALS — BP 130/82 | HR 116 | Ht 65.0 in | Wt 147.2 lb

## 2019-08-04 DIAGNOSIS — G40909 Epilepsy, unspecified, not intractable, without status epilepticus: Secondary | ICD-10-CM | POA: Diagnosis not present

## 2019-08-04 MED ORDER — RIZATRIPTAN BENZOATE 10 MG PO TBDP: 10.0000 mg | ORAL_TABLET | ORAL | 11 refills | Status: AC | PRN

## 2019-08-04 MED ORDER — LEVETIRACETAM 750 MG PO TABS: 1500.0000 mg | ORAL_TABLET | Freq: Two times a day (BID) | ORAL | 4 refills | Status: DC

## 2019-08-04 MED ORDER — TOPIRAMATE 100 MG PO TABS: 100.0000 mg | ORAL_TABLET | Freq: Every day | ORAL | 4 refills | Status: DC

## 2019-08-04 MED ORDER — TOPIRAMATE 100 MG PO TABS: 100.0000 mg | ORAL_TABLET | Freq: Two times a day (BID) | ORAL | 4 refills | Status: DC

## 2019-08-04 NOTE — Progress Notes (Signed)
[facility]  PATIENT: Jasmine [name] DOB: July 05, 1990  REFERRING CLINICIAN: Clent Demark, PA-C HISTORY FROM: patient  REASON FOR VISIT: new consult    HISTORICAL  CHIEF COMPLAINT:  Chief Complaint  Patient presents with  . Seizures    rm 7 New Pt, ED FU, "seizures since a child; my medicines have been increased but still having seizures, not missing any medicine"    HISTORY OF PRESENT ILLNESS:   29 year old female here for evaluation of seizures.  Patient had onset of seizures around 29 years old, with no particular warning, generalized convulsions.  Sometimes she would have confusion spells where she would walk around dazed, unresponsive and unaware.  Patient was initially on Tegretol and then Keppra, then back to Tegretol, then back to Keppra.  She was started on topiramate to help with seizures and migraine.  Patient was living in [place], managed by neurologist there.  In the past year she is moved to [place].  She lives with 3 daughters ages 41 to 38 years old.  Her husband travels back and forth between [place] and [place] for work.  Patient has had multiple seizures since that time.  Most of the seizures have occurred in the setting of missed medications, running out of medications, stress factors.    REVIEW OF SYSTEMS: Full 14 system review of systems performed and negative with exception of: As per HPI.  ALLERGIES: Allergies  Allergen Reactions  . Cheese Shortness Of Breath  . Chocolate Shortness Of Breath  . Ibuprofen Hives and Shortness Of Breath    Children's ibuprofen  . Ivp Dye [Iodinated Diagnostic Agents] Hives, Shortness Of Breath and Itching  . Latex Anaphylaxis, Swelling and Other (See Comments)    Reaction:  Localized swelling   . Orange Juice [Orange Oil] Shortness Of Breath  . Other Hives and Other (See Comments)    Pt states that she is allergic to all steroids except IV solu-medrol.    Marland Kitchen Peach [Prunus Persica]  Anaphylaxis  . Peanuts [Peanut Oil] Anaphylaxis  . Pear Anaphylaxis  . Prednisone Hives  . Raspberry Anaphylaxis  . Tylenol [Acetaminophen] Hives, Shortness Of Breath and Other (See Comments)    Pt states that this is only with the liquid form  . Amoxicillin Hives and Other (See Comments)    Has patient had a PCN reaction causing immediate rash, facial/tongue/throat swelling, SOB or lightheadedness with hypotension: No Has patient had a PCN reaction causing severe rash involving mucus membranes or skin necrosis: No Has patient had a PCN reaction that required hospitalization: No Has patient had a PCN reaction occurring within the last 10 years: No If all of the above answers are "NO", then may proceed with Cephalosporin use.  Marland Kitchen Apricot Flavor Hives  . Doxycycline Hives  . Erythromycin Hives  . Milk Of Magnesia [Magnesium Hydroxide] Hives and Itching  . Penicillins Hives and Other (See Comments)    Has patient had a PCN reaction causing immediate rash, facial/tongue/throat swelling, SOB or lightheadedness with hypotension: No Has patient had a PCN reaction causing severe rash involving mucus membranes or skin necrosis: No Has patient had a PCN reaction that required hospitalization: No Has patient had a PCN reaction occurring within the last 10 years: No If all of the above answers are "NO", then may proceed with Cephalosporin use.    HOME MEDICATIONS: Outpatient Medications Prior to Visit  Medication Sig Dispense Refill  . albuterol (PROVENTIL HFA;VENTOLIN HFA) 108 (90 Base) MCG/ACT inhaler  Inhale 2 puffs into the lungs every 4 (four) hours as needed for wheezing or shortness of breath. 1 Inhaler 3  . levETIRAcetam (KEPPRA) 500 MG tablet Take 3 tablets (1,500 mg total) by mouth 2 (two) times daily. 180 tablet 0  . SUMAtriptan (IMITREX) 50 MG tablet Take 1 tablet (50 mg total) by mouth daily. May take one more tablet two hours after the first. No more than two tablets per day. 20 tablet  1  . topiramate (TOPAMAX) 100 MG tablet Take 1 tablet (100 mg total) by mouth daily. 30 tablet 0   No facility-administered medications prior to visit.    PAST MEDICAL HISTORY: Past Medical History:  Diagnosis Date  . Asthma   . Cancer ([place])    ovarian  . Complication of anesthesia    "I wake up during anesthesia" (10/14/2015)  . DVT (deep vein thrombosis) in pregnancy   . Epilepsy ([place])    hx of seizures since childhood, most recent 07/29/19  . GERD (gastroesophageal reflux disease)   . Heart murmur    last work up age 39- no symtoms  . Pancreatitis   . Sickle cell trait ([place])   . Sleep apnea    "used to wear CPAP; don't have one here in [place] since I moved in 2014" (10/14/2015)    PAST SURGICAL HISTORY: Past Surgical History:  Procedure Laterality Date  . APPENDECTOMY    . CESAREAN SECTION  2013  . CESAREAN SECTION N/A 11/08/2014   Procedure: CESAREAN SECTION;  Surgeon: Truett Mainland, DO;  Location: [facility];  Service: Obstetrics;  Laterality: N/A;  . CHOLECYSTECTOMY N/A 10/16/2015   Procedure: LAPAROSCOPIC CHOLECYSTECTOMY WITH POSSIBLE INTRAOPERATIVE CHOLANGIOGRAM;  Surgeon: Donnie Mesa, MD;  Location: [facility];  Service: General;  Laterality: N/A;  . CLOSED REDUCTION MANDIBLE WITH MANDIBULOMA N/A 07/17/2017   Procedure: REDUCTION TMJ WITH INTRAMAXILLARY FIXATION;  Surgeon: Diona Browner, DDS;  Location: [facility];  Service: Oral Surgery;  Laterality: N/A;  . ESOPHAGOGASTRODUODENOSCOPY (EGD) WITH PROPOFOL N/A 11/15/2016   Procedure: ESOPHAGOGASTRODUODENOSCOPY (EGD) WITH PROPOFOL;  Surgeon: Clarene Essex, MD;  Location: [facility];  Service: Endoscopy;  Laterality: N/A;  . INGUINAL HERNIA REPAIR Bilateral ~ 1996  . NERVE, TENDON AND ARTERY REPAIR Right 09/23/2012   Procedure: I&D and Repair As Necessary/Right Hand and Palm;  Surgeon: Roseanne Kaufman, MD;  Location: [facility];  Service: Orthopedics;  Laterality: Right;  . OMENTECTOMY N/A 12/25/2018   Procedure: OMENTECTOMY;  Surgeon: Lafonda Mosses, MD;  Location: [facility];  Service: Gynecology;  Laterality: N/A;  . SALPINGOOPHORECTOMY N/A 12/25/2018   Procedure: OPEN UNILATERAL  SALPINGO OOPHORECTOMY WITH STAGING;  Surgeon: Lafonda Mosses, MD;  Location: [facility];  Service: Gynecology;  Laterality: N/A;  . TONSILLECTOMY      FAMILY HISTORY: Family History  Problem Relation Age of Onset  . Cancer Mother        cervical cancer  . Asthma Mother   . Hypertension Father   . Sickle cell anemia Father   . Asthma Sister   . Diabetes Maternal Aunt   . Lymphoma Maternal Grandmother     SOCIAL HISTORY: Social History   Socioeconomic History  . Marital status: Married    Spouse name: Not on file  . Number of children: 3  . Years of education: Not on file  . Highest education level: High school graduate  Occupational History  . Not on file  Tobacco Use  . Smoking status: Former Smoker    Packs/day: 0.25  Years: 4.00    Pack years: 1.00    Types: Cigarettes    Quit date: 10/02/2015    Years since quitting: 3.8  . Smokeless tobacco: Never Used  Vaping Use  . Vaping Use: Never used  Substance and Sexual Activity  . Alcohol use: No    Alcohol/week: 0.0 standard drinks  . Drug use: No    Comment: pos urine drug screens  . Sexual activity: Yes    Birth control/protection: None  Other Topics Concern  . Not on file  Social History Narrative   Lives with family   Social Determinants of Health   Financial Resource Strain:   . Difficulty of Paying Living Expenses:   Food Insecurity:   . Worried About Charity fundraiser in the Last Year:   . Arboriculturist in the Last Year:   Transportation Needs:   . Film/video editor (Medical):   Marland Kitchen Lack of Transportation (Non-Medical):   Physical Activity:   . Days of Exercise per Week:   . Minutes of Exercise per Session:   Stress:   . Feeling of Stress :   Social Connections:   . Frequency of Communication with Friends and Family:   . Frequency of Social  Gatherings with Friends and Family:   . Attends Religious Services:   . Active Member of Clubs or Organizations:   . Attends Archivist Meetings:   Marland Kitchen Marital Status:   Intimate Partner Violence:   . Fear of Current or Ex-Partner:   . Emotionally Abused:   Marland Kitchen Physically Abused:   . Sexually Abused:      PHYSICAL EXAM  GENERAL EXAM/CONSTITUTIONAL: Vitals:  Vitals:   08/04/19 1241  BP: 130/82  Pulse: (!) 116  Weight: 147 lb 3.2 oz (66.8 kg)  Height: 5\' 5"  (1.651 m)     Body mass index is 24.5 kg/m. Wt Readings from Last 3 Encounters:  08/04/19 147 lb 3.2 oz (66.8 kg)  07/10/19 151 lb 3.2 oz (68.6 kg)  07/05/19 160 lb 11.5 oz (72.9 kg)     Patient is in no distress; well developed, nourished and groomed; neck is supple  CARDIOVASCULAR:  Examination of carotid arteries is normal; no carotid bruits  Regular rate and rhythm, no murmurs  Examination of peripheral vascular system by observation and palpation is normal  EYES:  Ophthalmoscopic exam of optic discs and posterior segments is normal; no papilledema or hemorrhages  No exam data present  MUSCULOSKELETAL:  Gait, strength, tone, movements noted in Neurologic exam below  NEUROLOGIC: MENTAL STATUS:  No flowsheet data found.  awake, alert, oriented to person, place and time  recent and remote memory intact  normal attention and concentration  language fluent, comprehension intact, naming intact  fund of knowledge appropriate  CRANIAL NERVE:   2nd - no papilledema on fundoscopic exam  2nd, 3rd, 4th, 6th - pupils equal and reactive to light, visual fields full to confrontation, extraocular muscles intact, no nystagmus  5th - facial sensation symmetric  7th - facial strength symmetric  8th - hearing intact  9th - palate elevates symmetrically, uvula midline  11th - shoulder shrug symmetric  12th - tongue protrusion midline  MOTOR:   normal bulk and tone, full strength in the  BUE, BLE  SENSORY:   normal and symmetric to light touch, temperature, vibration  COORDINATION:   finger-nose-finger, fine finger movements normal  REFLEXES:   deep tendon reflexes present and symmetric  GAIT/STATION:   narrow  based gait     DIAGNOSTIC DATA (LABS, IMAGING, TESTING) - I reviewed patient records, labs, notes, testing and imaging myself where available.  Lab Results  Component Value Date   WBC 14.4 (H) 07/29/2019   HGB 10.0 (L) 07/29/2019   HCT 32.9 (L) 07/29/2019   MCV 75.8 (L) 07/29/2019   PLT 389 07/29/2019      Component Value Date/Time   NA 137 07/29/2019 1936   NA 141 08/20/2017 1144   K 3.3 (L) 07/29/2019 1936   CL 105 07/29/2019 1936   CO2 25 07/29/2019 1936   GLUCOSE 79 07/29/2019 1936   BUN 7 07/29/2019 1936   BUN 10 08/20/2017 1144   CREATININE 0.67 07/29/2019 1936   CALCIUM 8.7 (L) 07/29/2019 1936   PROT 7.6 07/10/2019 1413   PROT 7.1 08/20/2017 1144   ALBUMIN 4.3 07/10/2019 1413   ALBUMIN 4.4 08/20/2017 1144   AST 23 07/10/2019 1413   ALT 21 07/10/2019 1413   ALKPHOS 57 07/10/2019 1413   BILITOT 0.2 (L) 07/10/2019 1413   BILITOT <0.2 08/20/2017 1144   GFRNONAA >60 07/29/2019 1936   GFRAA >60 07/29/2019 1936   Lab Results  Component Value Date   TRIG 213 (H) 04/22/2015   No results found for: HGBA1C Lab Results  Component Value Date   VITAMINB12 325 03/29/2019   Lab Results  Component Value Date   TSH 3.930 08/20/2017    03/28/19 MRI brain 1. Normal MRI appearance of the brain. No acute or focal lesion to explain seizures. 2. Left frontal sinus opacification.  03/28/19 EEG - normal  07/05/19 EEG - normal    ASSESSMENT AND PLAN  29 y.o. 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  Dx:  1. Seizure disorder ([place])     PLAN:  SEIZURE DISORDER (complex partial and generalized seizures) - continue levetiracetam 1500mg   twice a day (for seizure prevention) - caution with living situation;  recommend to get help from family, friends, neighbors; currently living at home with 3 children (ages 4-20yrs); husband works out of town most of the month - no driving until seizure free x 6 months - will request records from prior neurologist  [place] - increase topiramate to 100mg  twice a day (for migraine and seizure prevention) - start rizatriptan as needed for migraine  Meds ordered this encounter  Medications  . levETIRAcetam (KEPPRA) 750 MG tablet    Sig: Take 2 tablets (1,500 mg total) by mouth 2 (two) times daily.    Dispense:  360 tablet    Refill:  4  . DISCONTD: topiramate (TOPAMAX) 100 MG tablet    Sig: Take 1 tablet (100 mg total) by mouth daily.    Dispense:  90 tablet    Refill:  4  . topiramate (TOPAMAX) 100 MG tablet    Sig: Take 1 tablet (100 mg total) by mouth 2 (two) times daily.    Dispense:  180 tablet    Refill:  4  . rizatriptan (MAXALT-MLT) 10 MG disintegrating tablet    Sig: Take 1 tablet (10 mg total) by mouth as needed for migraine. May repeat in 2 hours if needed    Dispense:  9 tablet    Refill:  11   Return in about 6 months (around 02/04/2020) for with NP (Amy Lomax).    Penni Bombard, MD 6/38/4665, 9:93 PM Certified in Neurology, Neurophysiology and Neuroimaging  [facility]  [place], [address] [place], [place] [address] [phone]

## 2019-08-04 NOTE — Patient Instructions (Addendum)
SEIZURE DISORDER (complex partial and generalized seizures) - continue levetiracetam 1500mg  twice a day (for seizure prevention) - caution with living situation; recommend to get help from family, friends, neighbors; currently living at home with 3 children (ages 4-58yrs); husband works out of town most of the month - no driving until seizure free x 6 months  Jasmine [name] - increase topiramate to 100mg  twice a day (for migraine and seizure prevention) - start rizatriptan as needed for migraine

## 2019-08-07 ENCOUNTER — Other Ambulatory Visit: Payer: Self-pay | Admitting: *Deleted

## 2019-08-07 NOTE — Patient Outreach (Signed)
[facility]) Care Management  08/07/2019  Jasmine [name] [birth year] [id number]  Managed Medicaid Referral, first attempt unsuccessful. Left message on home phone number line. Mobile phone mailbox was full.  I will call pt back on Monday.  [name]. [name], MSN, [facility] Gerontological Nurse Practitioner [facility] Care Management [phone]

## 2019-08-10 ENCOUNTER — Other Ambulatory Visit: Payer: Self-pay | Admitting: *Deleted

## 2019-08-10 NOTE — Patient Outreach (Signed)
[facility]) Care Management  08/10/2019  Bobby Ragan 19-Apr-1990 [id number]  Second outreach for Managed Medicaid referral. Left message and requested a return call. Will send unsuccessful letter. Have scheduled 3rd call for 08/17/19.  [name]. [name], MSN, [facility] Gerontological Nurse Practitioner [facility] Care Management [phone]

## 2019-08-16 DIAGNOSIS — J45909 Unspecified asthma, uncomplicated: Secondary | ICD-10-CM | POA: Diagnosis not present

## 2019-08-16 DIAGNOSIS — G4739 Other sleep apnea: Secondary | ICD-10-CM | POA: Diagnosis not present

## 2019-08-16 DIAGNOSIS — R2689 Other abnormalities of gait and mobility: Secondary | ICD-10-CM | POA: Diagnosis not present

## 2019-08-16 DIAGNOSIS — J45901 Unspecified asthma with (acute) exacerbation: Secondary | ICD-10-CM | POA: Diagnosis not present

## 2019-08-17 ENCOUNTER — Other Ambulatory Visit: Payer: Self-pay | Admitting: *Deleted

## 2019-08-17 NOTE — Patient Outreach (Signed)
[facility]) Care Management  08/17/2019  Diesha Rostad 25-Aug-1990 [id number]  Third and final initial assessment attempt.  No answer to pt cell or home numbers. Left a message advising this would be my last outreach. Encouraged to call if she decides she would like to work with me.  [name]. [name], MSN, [facility] Gerontological Nurse Practitioner [facility] Care Management [phone]

## 2019-09-16 DIAGNOSIS — J45901 Unspecified asthma with (acute) exacerbation: Secondary | ICD-10-CM | POA: Diagnosis not present

## 2019-09-16 DIAGNOSIS — R2689 Other abnormalities of gait and mobility: Secondary | ICD-10-CM | POA: Diagnosis not present

## 2019-09-16 DIAGNOSIS — J45909 Unspecified asthma, uncomplicated: Secondary | ICD-10-CM | POA: Diagnosis not present

## 2019-09-16 DIAGNOSIS — G4739 Other sleep apnea: Secondary | ICD-10-CM | POA: Diagnosis not present

## 2019-10-02 ENCOUNTER — Telehealth: Payer: Self-pay | Admitting: Diagnostic Neuroimaging

## 2019-10-02 NOTE — Telephone Encounter (Signed)
Pt called stating that her seizure activity is getting worse even with the increase of her medication dosages. Pt is needing to speak to provider because she is not feeling well. She states it went from once a week to two or three times a week. Please advise.

## 2019-10-02 NOTE — Telephone Encounter (Signed)
Please setup follow up appt (video visit) next week with me or Amy. -VRP

## 2019-10-05 ENCOUNTER — Encounter: Payer: Self-pay | Admitting: *Deleted

## 2019-10-05 NOTE — Telephone Encounter (Signed)
Called patient and informed her Dr Leta Baptist wants her to  VV this week, offered VV with NP tomorrow at 1:30 pm. She agreed. She stated two days ago she had a seizure, called EMS but wasn't taken to hospital. EMS monitored her. She's taking keppra and topamax as prescribed.  She had another seizure yesterday.  I advised her I'll let Dr Leta Baptist know and call her with any instructions.  Patient verbalized understanding, appreciation.

## 2019-10-05 NOTE — Telephone Encounter (Signed)
Sent my chart with MD reply.

## 2019-10-05 NOTE — Telephone Encounter (Signed)
May need to add on additional treatment (eg vimpat). May need long term video eeg monitoring, but difficulty given her family situation. -VRP

## 2019-10-06 ENCOUNTER — Telehealth (INDEPENDENT_AMBULATORY_CARE_PROVIDER_SITE_OTHER): Payer: Medicaid Other | Admitting: Family Medicine

## 2019-10-06 ENCOUNTER — Encounter: Payer: Self-pay | Admitting: Family Medicine

## 2019-10-06 DIAGNOSIS — G40909 Epilepsy, unspecified, not intractable, without status epilepticus: Secondary | ICD-10-CM | POA: Diagnosis not present

## 2019-10-06 MED ORDER — LACOSAMIDE 50 MG PO TABS: 50.0000 mg | ORAL_TABLET | Freq: Two times a day (BID) | ORAL | 11 refills | Status: DC

## 2019-10-06 NOTE — Progress Notes (Signed)
PATIENT: Jasmine [name] DOB: February 17, 1990  REASON FOR VISIT: follow up HISTORY FROM: patient  Virtual Visit via Telephone Note  I connected with Jasmine [name] on 10/06/19 at  1:30 PM EDT by telephone and verified that I am speaking with the correct person using two identifiers.   I discussed the limitations, risks, security and privacy concerns of performing an evaluation and management service by telephone and the availability of in person appointments. I also discussed with the patient that there may be a patient responsible charge related to this service. The patient expressed understanding and agreed to proceed.   History of Present Illness:  10/06/19 Jasmine [name] is a 29 y.o. female here today for follow up for seizure. She continues levetiracetam 1500mg  and topiramate 100mg  BID. She has had several breakthrough seizures. She denies missed doses of medications. Over past month, seizure have occurred nearly every day. She does not know what happens during event but states they are usually late in the afternoon or at night. Her oldest daughter is able to speak with me today and describes her mother's seizure events as generalized shaking and her eyes roll to the back of her head. She is not responsive. Her daughter will call her dad or EMS. EMS has come out several times for eval but she has not been see by the hospital. She states that she has not been eating or drinking normally. She usually drinks ensure throughout the day. She has a hard time with nausea. She has not seen PCP in over a year. She may drink about 8 ounces of water daily.    History (copied form Dr Gladstone Lighter note on 08/04/2019)  29 year old female here for evaluation of seizures.  Patient had onset of seizures around 29 years old, with no particular warning, generalized convulsions.  Sometimes she would have confusion spells where she would walk around dazed, unresponsive and unaware.  Patient was initially on  Tegretol and then Keppra, then back to Tegretol, then back to Keppra.  She was started on topiramate to help with seizures and migraine.  Patient was living in [place], managed by neurologist there.  In the past year she is moved to [place].  She lives with 3 daughters ages 43 to 36 years old.  Her husband travels back and forth between [place] and [place] for work.  Patient has had multiple seizures since that time.  Most of the seizures have occurred in the setting of missed medications, running out of medications, stress factors.     Observations/Objective:  Generalized: Well developed, in no acute distress  Mentation: Alert oriented to time, place, history taking. Follows all commands speech and language fluent Motor: able to move upper and lower extremities freely, gait appears stable    Assessment and Plan:  29 y.o. year old female  has a past medical history of Asthma, Cancer ([place]), Complication of anesthesia, DVT (deep vein thrombosis) in pregnancy, Epilepsy ([place]), GERD (gastroesophageal reflux disease), Heart murmur, Pancreatitis, Sickle cell trait ([place]), and Sleep apnea. here with    ICD-10-CM   1. Seizure disorder ([place])  G40.909    Dianah reports that she is having seizure activity nearly every day. Most events occur late evening or at night. Several calls made to EMS but she has not been seen by ER since 07/2019. Daughter described generalized shaking and states mom is unresponsive. She has had multiple tongue injuries. I have advised that she continue levetiracetam 1500mg  and topiramate 100mg  twice daily. I  will add lacosamide 50mg  twice daily. I would like to order extended EEG, however, will hold off for now as she is primary care giver to her three daughters and her husband works out of town primarily. I have asked her to contact PCP today to discuss concerns of nausea with eating. She was educated on the importance of hydration. I have asked her to increased water  intake to a minimum of 30 ounces daily. She will follow up with [place] in 3 months. She will call with progress report in 1-2 weeks after starting lacosamide. No driving until 6 months seizure free. She verbalizes understanding and agreement with this plan.    No orders of the defined types were placed in this encounter.   Meds ordered this encounter  Medications  . lacosamide (VIMPAT) 50 MG TABS tablet    Sig: Take 1 tablet (50 mg total) by mouth 2 (two) times daily.    Dispense:  60 tablet    Refill:  11    Order Specific Question:   Supervising Provider    Answer:   Melvenia Beam [[id number]]     Follow Up Instructions:  I discussed the assessment and treatment plan with the patient. The patient was provided an opportunity to ask questions and all were answered. The patient agreed with the plan and demonstrated an understanding of the instructions.   The patient was advised to call back or seek an in-person evaluation if the symptoms worsen or if the condition fails to improve as anticipated.  I provided 25 minutes of non-face-to-face time during this encounter. Patient is located at her place of residence during my chart visit. Provider is in the office.    Debbora Presto, NP

## 2019-10-06 NOTE — Progress Notes (Signed)
I reviewed note and agree with plan.   Penni Bombard, MD 6/54/8688, 5:20 PM Certified in Neurology, Neurophysiology and Neuroimaging  [facility] [address], [place], [place] [address] [phone]

## 2019-10-07 ENCOUNTER — Telehealth: Payer: Self-pay | Admitting: *Deleted

## 2019-10-07 NOTE — Telephone Encounter (Signed)
Received approval for vimpat 10-07-19 thru 10-06-2020. Faxed to [facility] with confirmation received.

## 2019-10-07 NOTE — Telephone Encounter (Signed)
Initiated [place] for Vimpat DX G40.909, (did have complex partial sz G40.209 did not use).  Has used tegretol, levetirzcetam, topiramate.  Determination pending.   ID [id number] T. [place].

## 2019-10-16 DIAGNOSIS — J45909 Unspecified asthma, uncomplicated: Secondary | ICD-10-CM | POA: Diagnosis not present

## 2019-10-16 DIAGNOSIS — G4739 Other sleep apnea: Secondary | ICD-10-CM | POA: Diagnosis not present

## 2019-10-16 DIAGNOSIS — R2689 Other abnormalities of gait and mobility: Secondary | ICD-10-CM | POA: Diagnosis not present

## 2019-10-16 DIAGNOSIS — J45901 Unspecified asthma with (acute) exacerbation: Secondary | ICD-10-CM | POA: Diagnosis not present

## 2019-11-02 IMAGING — DX DG CHEST 2V
2 series · 2 of 2 positions shown · non-contrast
Comparison: Radiographs June 12, 2017.

CLINICAL DATA: Shortness of breath.

EXAM:
CHEST - 2 VIEW

[chest pa]
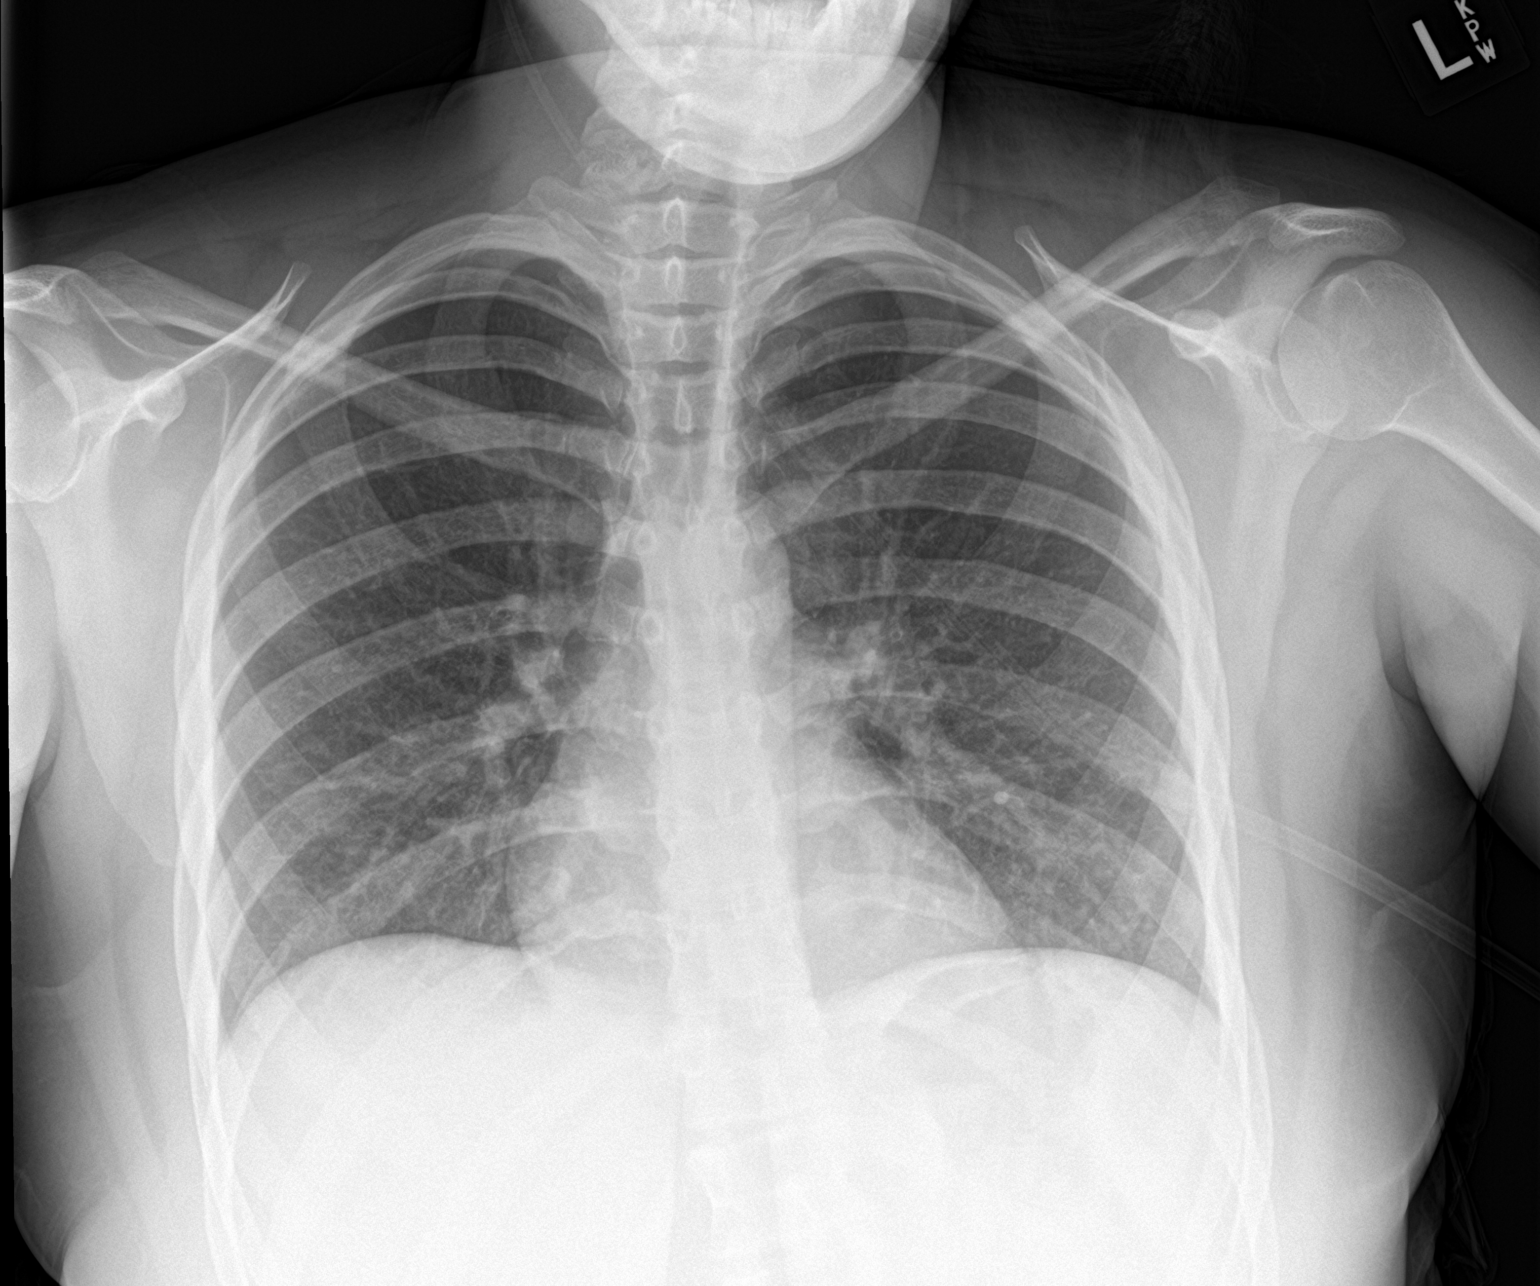

[chest lat]
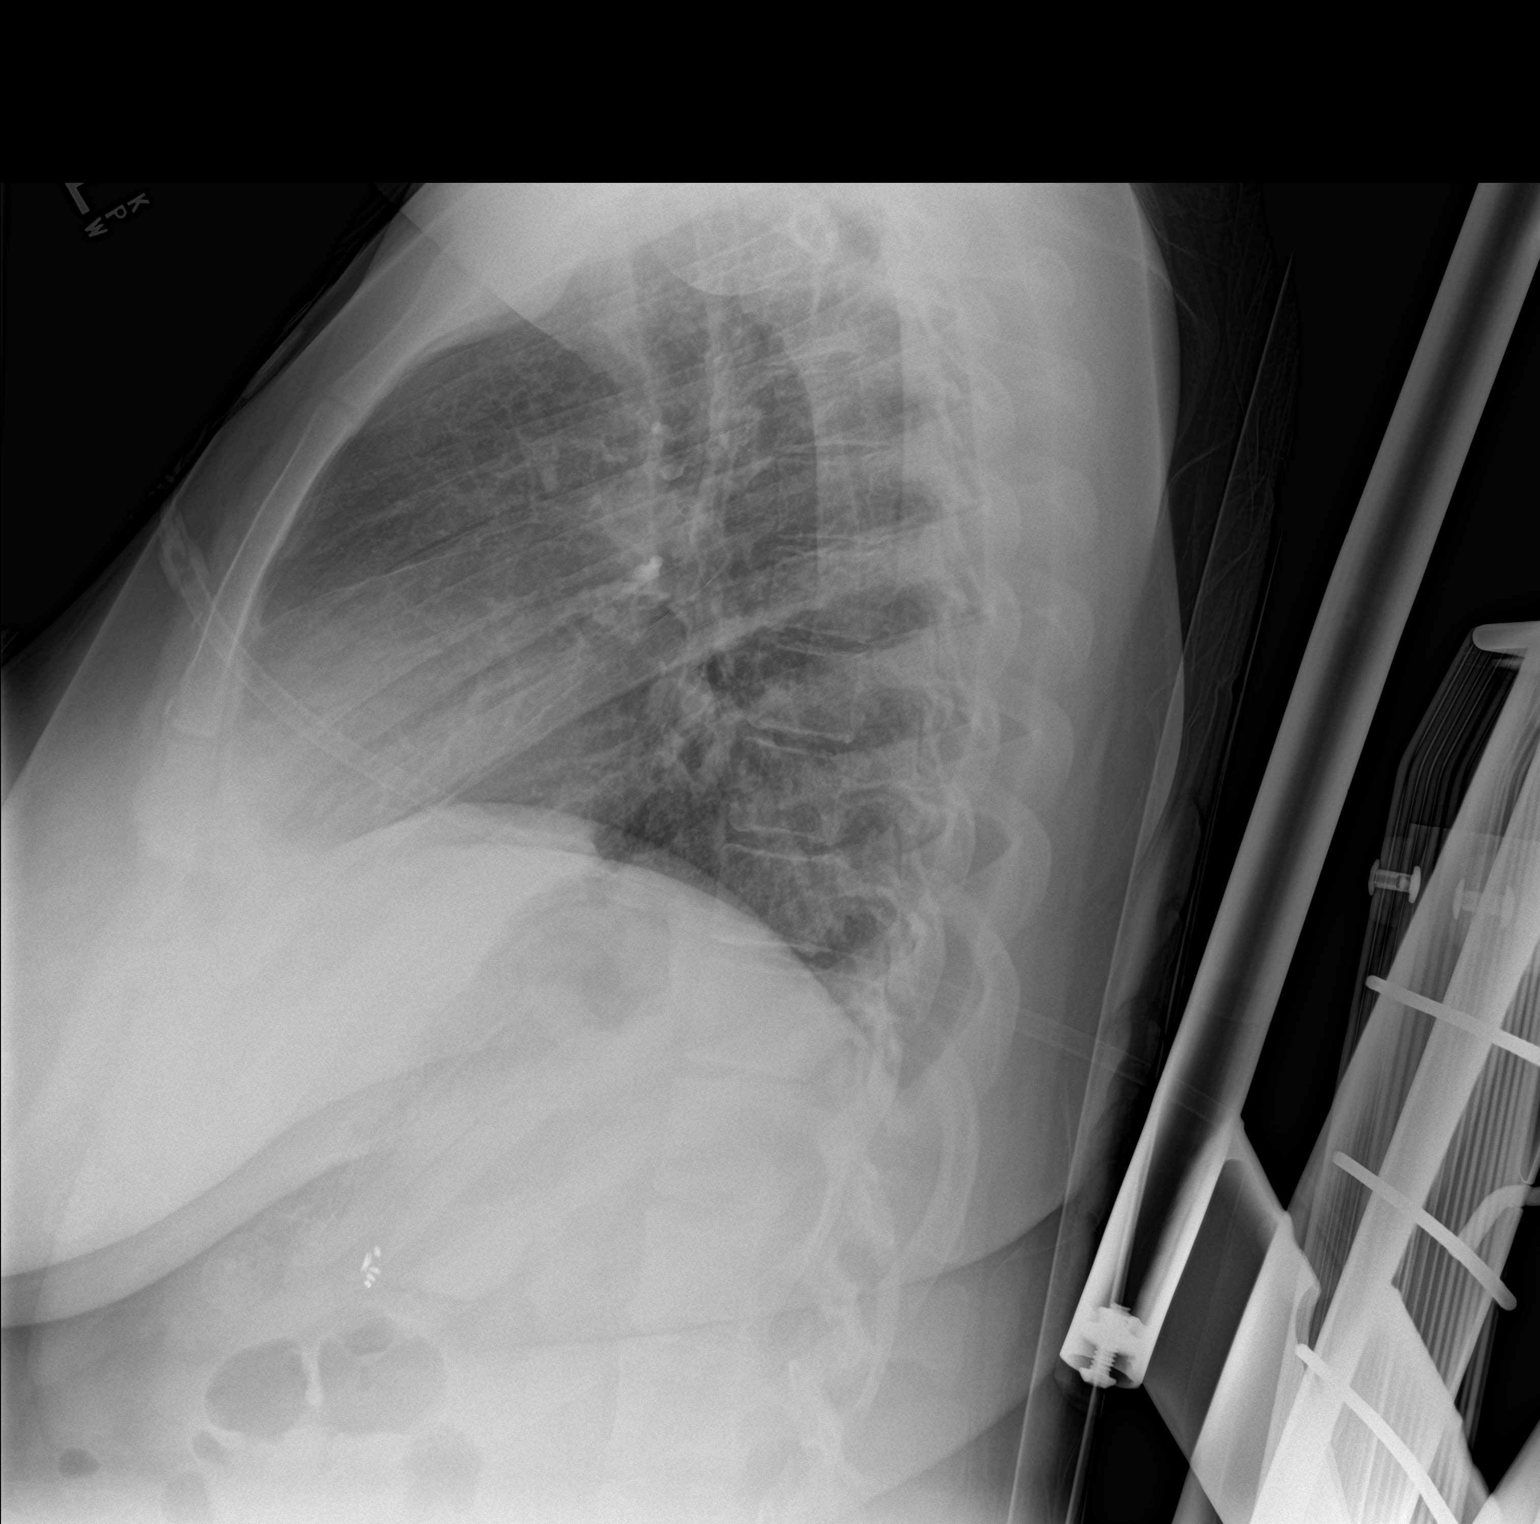

[2 of 2 positions shown; findings below may reference images not displayed]

FINDINGS: The heart size and mediastinal contours are within normal limits.
Both lungs are clear. No pneumothorax or pleural effusion is noted.
The visualized skeletal structures are unremarkable.
IMPRESSION: No active cardiopulmonary disease.

## 2019-11-16 DIAGNOSIS — G4739 Other sleep apnea: Secondary | ICD-10-CM | POA: Diagnosis not present

## 2019-11-16 DIAGNOSIS — J45909 Unspecified asthma, uncomplicated: Secondary | ICD-10-CM | POA: Diagnosis not present

## 2019-11-16 DIAGNOSIS — J45901 Unspecified asthma with (acute) exacerbation: Secondary | ICD-10-CM | POA: Diagnosis not present

## 2019-11-16 DIAGNOSIS — R2689 Other abnormalities of gait and mobility: Secondary | ICD-10-CM | POA: Diagnosis not present

## 2019-11-23 ENCOUNTER — Telehealth: Payer: Self-pay | Admitting: Family Medicine

## 2019-11-23 NOTE — Telephone Encounter (Signed)
Pt called, for a couple of days have been having seizures in my sleep. Have bit my lip, tongue and body pain. Can physician add another medication? Would like a call from the nurse.

## 2019-11-23 NOTE — Telephone Encounter (Signed)
I called pt and she has had nocturanl sz last week 2 back to back, (witness, but not sure how long per family member.  Then this past weekend sat and sun , no trigger. Went over taking her medications as ordered. vimpat 50mg  po bid, keppra 1500mg  po bid toapmax 100mg  po bid.  She is taking medication for sinus cold with decongestant.  She is sleeping ok.  I relayed that decongestants can trigger sz.  She will stop taking decongestant and try mucinex, salt water NS.  Providers have gone.  Will let them know tomorrow and then call her back. She verbalized understanding. Has appt 01-11-20 with Dr. Leta Baptist.

## 2019-11-24 NOTE — Telephone Encounter (Signed)
I called pt to check on her.  She states that she has sz last night.  I relayed that per AL/NP that to monitor and follow up as planned.  She has leg pain.  She did state that she had sz last night, could not tell me anything more.  She will let me know if continues. She does live with someone.

## 2019-11-24 NOTE — Telephone Encounter (Signed)
Agree with plan. Have her monitor closely and follow up as planned.

## 2019-11-28 ENCOUNTER — Emergency Department (HOSPITAL_COMMUNITY)
Admission: EM | Admit: 2019-11-28 | Discharge: 2019-11-28 | Disposition: A | Discharge status: Home / Self Care | Payer: Medicaid Other | Attending: Emergency Medicine | Admitting: Emergency Medicine

## 2019-11-28 ENCOUNTER — Encounter (HOSPITAL_COMMUNITY): Payer: Self-pay

## 2019-11-28 ENCOUNTER — Emergency Department (HOSPITAL_COMMUNITY): Payer: Medicaid Other

## 2019-11-28 ENCOUNTER — Other Ambulatory Visit: Payer: Self-pay

## 2019-11-28 DIAGNOSIS — G40919 Epilepsy, unspecified, intractable, without status epilepticus: Secondary | ICD-10-CM

## 2019-11-28 DIAGNOSIS — S0011XA Contusion of right eyelid and periocular area, initial encounter: Secondary | ICD-10-CM

## 2019-11-28 DIAGNOSIS — R569 Unspecified convulsions: Secondary | ICD-10-CM

## 2019-11-28 DIAGNOSIS — R9431 Abnormal electrocardiogram [ECG] [EKG]: Secondary | ICD-10-CM | POA: Diagnosis not present

## 2019-11-28 DIAGNOSIS — G40909 Epilepsy, unspecified, not intractable, without status epilepticus: Secondary | ICD-10-CM | POA: Diagnosis not present

## 2019-11-28 LAB — COMPREHENSIVE METABOLIC PANEL
ALT: 16 U/L (ref 0–44)
AST: 25 U/L (ref 15–41)
Albumin: 4.8 g/dL (ref 3.5–5.0)
Alkaline Phosphatase: 51 U/L (ref 38–126)
Anion gap: 13 (ref 5–15)
BUN: 6 mg/dL (ref 6–20)
CO2: 22 mmol/L (ref 22–32)
Calcium: 9.3 mg/dL (ref 8.9–10.3)
Chloride: 101 mmol/L (ref 98–111)
Creatinine, Ser: 0.78 mg/dL (ref 0.44–1.00)
GFR, Estimated: >60 mL/min (ref >60)
Glucose, Bld: 117 mg/dL — ABNORMAL HIGH (ref 70–99)
Potassium: 2.8 mmol/L — ABNORMAL LOW (ref 3.5–5.1)
Sodium: 136 mmol/L (ref 135–145)
Total Bilirubin: 0.3 mg/dL (ref 0.3–1.2)
Total Protein: 8.3 g/dL — ABNORMAL HIGH (ref 6.5–8.1)

## 2019-11-28 LAB — CBC WITH DIFFERENTIAL/PLATELET
Abs Immature Granulocytes: 0.18 10*3/uL — ABNORMAL HIGH (ref 0.00–0.07)
Basophils Absolute: 0.1 10*3/uL (ref 0.0–0.1)
Basophils Relative: 0 %
Eosinophils Absolute: 0.3 10*3/uL (ref 0.0–0.5)
Eosinophils Relative: 1 %
HCT: 34.3 % — ABNORMAL LOW (ref 36.0–46.0)
Hemoglobin: 10 g/dL — ABNORMAL LOW (ref 12.0–15.0)
Immature Granulocytes: 1 %
Lymphocytes Relative: 23 %
Lymphs Abs: 6.9 10*3/uL — ABNORMAL HIGH (ref 0.7–4.0)
MCH: 22.6 pg — ABNORMAL LOW (ref 26.0–34.0)
MCHC: 29.2 g/dL — ABNORMAL LOW (ref 30.0–36.0)
MCV: 77.4 fL — ABNORMAL LOW (ref 80.0–100.0)
Monocytes Absolute: 1.8 10*3/uL — ABNORMAL HIGH (ref 0.1–1.0)
Monocytes Relative: 6 %
Neutro Abs: 21.2 10*3/uL — ABNORMAL HIGH (ref 1.7–7.7)
Neutrophils Relative %: 69 %
Platelets: 370 10*3/uL (ref 150–400)
RBC: 4.43 MIL/uL (ref 3.87–5.11)
RDW: 19.4 % — ABNORMAL HIGH (ref 11.5–15.5)
WBC: 30.5 10*3/uL — ABNORMAL HIGH (ref 4.0–10.5)
nRBC: 0 % (ref 0.0–0.2)

## 2019-11-28 LAB — ACETAMINOPHEN LEVEL: Acetaminophen (Tylenol), Serum: 18 ug/mL (ref 10–30)

## 2019-11-28 LAB — ETHANOL: Alcohol, Ethyl (B): <10 mg/dL (ref <10)

## 2019-11-28 LAB — CBG MONITORING, ED: Glucose-Capillary: 109 mg/dL — ABNORMAL HIGH (ref 70–99)

## 2019-11-28 LAB — I-STAT BETA HCG BLOOD, ED (MC, WL, AP ONLY): I-stat hCG, quantitative: <5 m[IU]/mL (ref <5)

## 2019-11-28 LAB — SALICYLATE LEVEL: Salicylate Lvl: <7 mg/dL — ABNORMAL LOW (ref 7.0–30.0)

## 2019-11-28 MED ORDER — IBUPROFEN 200 MG PO TABS
600.0000 mg | ORAL_TABLET | Freq: Once | ORAL | Status: DC
  Filled 2019-11-28: qty 3

## 2019-11-28 MED ORDER — LEVETIRACETAM IN NACL 1000 MG/100ML IV SOLN
1000.0000 mg | Freq: Once | INTRAVENOUS | Status: AC
  Administered 2019-11-28: 1000 mg via INTRAVENOUS
  Filled 2019-11-28: qty 100

## 2019-11-28 MED ORDER — LORAZEPAM 2 MG/ML IJ SOLN
2.0000 mg | Freq: Once | INTRAMUSCULAR | Status: AC
  Administered 2019-11-28: 2 mg via INTRAVENOUS
  Filled 2019-11-28: qty 1

## 2019-11-28 NOTE — ED Provider Notes (Signed)
[facility] DEPT Provider Note   CSN: [id number] Arrival date & time: 11/28/19  0031     History Chief Complaint  Patient presents with  . Seizures    Jasmine [name] is a 29 y.o. female.  Patient is a 29 year old female with history of seizure disorder.  She presents today after apparently having seizures at home.  While she was checking in triage, she experienced a seizure, then struck her head on the counter.  She was brought back immediately to the exam room and evaluated.  Patient postictal upon my initial evaluation and offers no additional history.  The history is provided by the patient.  Seizures Seizure activity on arrival: yes   Seizure type:  Grand mal Duration:  5 minutes Progression:  Resolved      Past Medical History:  Diagnosis Date  . Seizures ([place])     There are no problems to display for this patient.   History reviewed. No pertinent surgical history.   OB History   No obstetric history on file.     No family history on file.  Social History   Tobacco Use  . Smoking status: Never Smoker  . Smokeless tobacco: Never Used  Substance Use Topics  . Alcohol use: Not Currently  . Drug use: Not Currently    Home Medications Prior to Admission medications   Medication Sig Start Date End Date Taking? Authorizing Provider  levETIRAcetam (KEPPRA) 750 MG tablet Take 750 mg by mouth 2 (two) times daily.  07/04/19   [provider]  rizatriptan (MAXALT-MLT) 10 MG disintegrating tablet Take 10 mg by mouth 2 (two) times daily as needed. 09/27/19   [provider]  topiramate (TOPAMAX) 100 MG tablet  11/19/19   [provider]  VIMPAT 50 MG TABS tablet Take 50 mg by mouth 2 (two) times daily. 10/09/19   [provider]    Allergies    Patient has no known allergies.  Review of Systems   Review of Systems  Neurological: Positive for seizures.  All other systems reviewed and are  negative.   Physical Exam Updated Vital Signs BP 110/69   Pulse 89   Temp 97.9 F (36.6 C) (Oral)   Resp 18   SpO2 100%   Physical Exam Vitals and nursing note reviewed.  Constitutional:      General: She is not in acute distress.    Appearance: She is well-developed. She is not diaphoretic.  HENT:     Head: Normocephalic and atraumatic.  Eyes:     Extraocular Movements: Extraocular movements intact.     Pupils: Pupils are equal, round, and reactive to light.  Cardiovascular:     Rate and Rhythm: Normal rate and regular rhythm.     Heart sounds: No murmur heard.  No friction rub. No gallop.   Pulmonary:     Effort: Pulmonary effort is normal. No respiratory distress.     Breath sounds: Normal breath sounds. No wheezing.  Abdominal:     General: Bowel sounds are normal. There is no distension.     Palpations: Abdomen is soft.     Tenderness: There is no abdominal tenderness.  Musculoskeletal:        General: Normal range of motion.     Cervical back: Normal range of motion and neck supple.  Skin:    General: Skin is warm and dry.  Neurological:     Mental Status: She is alert.     Comments: Patient  arrived postictal.  She was initially unresponsive, then became agitated and combative.     ED Results / Procedures / Treatments   Labs (all labs ordered are listed, but only abnormal results are displayed) Labs Reviewed  COMPREHENSIVE METABOLIC PANEL - Abnormal; Notable for the following components:      Result Value   Potassium 2.8 (*)    Glucose, Bld 117 (*)    Total Protein 8.3 (*)    All other components within normal limits  CBC WITH DIFFERENTIAL/PLATELET - Abnormal; Notable for the following components:   WBC 30.5 (*)    Hemoglobin 10.0 (*)    HCT 34.3 (*)    MCV 77.4 (*)    MCH 22.6 (*)    MCHC 29.2 (*)    RDW 19.4 (*)    Neutro Abs 21.2 (*)    Lymphs Abs 6.9 (*)    Monocytes Absolute 1.8 (*)    Abs Immature Granulocytes 0.18 (*)    All other  components within normal limits  SALICYLATE LEVEL - Abnormal; Notable for the following components:   Salicylate Lvl <5.5 (*)    All other components within normal limits  CBG MONITORING, ED - Abnormal; Notable for the following components:   Glucose-Capillary 109 (*)    All other components within normal limits  ETHANOL  ACETAMINOPHEN LEVEL  URINALYSIS, ROUTINE W REFLEX MICROSCOPIC  RAPID URINE DRUG SCREEN, HOSP PERFORMED  I-STAT BETA HCG BLOOD, ED ([facility], [facility], [facility] ONLY)    EKG None  Radiology CT Head Wo Contrast  Result Date: 11/28/2019 CLINICAL DATA:  Seizure EXAM: CT HEAD WITHOUT CONTRAST TECHNIQUE: Contiguous axial images were obtained from the base of the skull through the vertex without intravenous contrast. COMPARISON:  None. FINDINGS: Brain: Normal anatomic configuration. No abnormal intra or extra-axial mass lesion or fluid collection. No abnormal mass effect or midline shift. No evidence of acute intracranial hemorrhage or infarct. Ventricular size is normal. Cerebellum unremarkable. Vascular: Unremarkable Skull: Intact Sinuses/Orbits: Small mucous retention cyst is seen within the left frontal sinus. The paranasal sinuses are otherwise clear. Orbits are unremarkable. Other: Mastoid air cells and middle ear cavities are clear. IMPRESSION: Normal head CT.  No acute intracranial abnormality. Electronically Signed   By: Fidela Salisbury MD   On: 11/28/2019 01:30    Procedures Procedures (including critical care time)  Medications Ordered in ED Medications  LORazepam (ATIVAN) injection 2 mg (2 mg Intravenous Given 11/28/19 0054)    ED Course  I have reviewed the triage vital signs and the nursing notes.  Pertinent labs & imaging results that were available during my care of the patient were reviewed by me and considered in my medical decision making (see chart for details).    MDM Rules/Calculators/A&P  Patient is a 29 year old female presenting for evaluation of seizures.   She apparently had increased seizure activity at home this evening, then came to the ER to be evaluated.  While checking in, she had another seizure, then struck her head on the counter in registration.  Patient then immediately brought back for evaluation.  She was initially postictal and somnolent and difficult to arouse.  She then woke up and became combative.  She attempted to jump out of bed and strike members of the nursing staff.  Patient was eventually subdued, then began to settle down.  She was given intravenous Ativan.  Laboratory studies obtained as well as CT scan of the head.  Her laboratory studies are essentially unremarkable with the exception of  a white count of 30,000.  I am uncertain as to the etiology of this as the patient is not febrile, nor is she tachycardic or septic appearing.  I suspect that this is demargination related to the seizure activity she is experiencing.  Patient was given an additional dose of Keppra here in the ER due to the breakthrough seizure.  I am unable to ascertain as to whether or not she has been compliant with her medications as she continues to be somnolent.  She has woken up and asked for medicine for pain.  She was offered and given ibuprofen.  At this point, it appears as though patient will require more time to recover from this post ictal state and the Ativan/Keppra she was given for this breakthrough seizure.  She is arousable and responds to questions appropriately, however promptly drifts back off to sleep.  Care will be signed out to oncoming provider at shift change.  CRITICAL CARE Performed by: Veryl Speak Total critical care time: 45 minutes Critical care time was exclusive of separately billable procedures and treating other patients. Critical care was necessary to treat or prevent imminent or life-threatening deterioration. Critical care was time spent personally by me on the following activities: development of treatment plan with  patient and/or surrogate as well as nursing, discussions with consultants, evaluation of patient's response to treatment, examination of patient, obtaining history from patient or surrogate, ordering and performing treatments and interventions, ordering and review of laboratory studies, ordering and review of radiographic studies, pulse oximetry and re-evaluation of patient's condition.   Final Clinical Impression(s) / ED Diagnoses Final diagnoses:  None    Rx / DC Orders ED Discharge Orders    None       Veryl Speak, MD 11/28/19 [phone]

## 2019-11-28 NOTE — ED Triage Notes (Signed)
Patient rushed back from triage after having a seizure in the lobby. Patient reports having epilepsy and takes Keppra. Patient hit her right side of the head. Hematoma formed on right eyebrow.

## 2019-11-28 NOTE — ED Notes (Signed)
Pt leaving AMA, did not want help but was complaining of headache. Pt refused pain med in front of DR. Wentz.

## 2019-11-28 NOTE — ED Triage Notes (Signed)
Pt arrives to ER POV and actively seizes in the waiting room prior to being arrived. Grand Mal seizure lasting approx 20 seconds. Pt wakes up in treatment room and states name and says she has a hx of epilepsy and takes keppra and something additional.

## 2019-11-28 NOTE — ED Notes (Signed)
Pt resting in bed, respirations even and unlabored. No s/s of discomfort. Will continue to monitor

## 2019-11-28 NOTE — Discharge Instructions (Signed)
Take your medicine as directed.  Use Tylenol or Advil for pain.  See your doctor for checkup in 1 week.

## 2019-11-28 NOTE — ED Provider Notes (Signed)
1:50 PM-patient currently arguing with nurse regarding leaving AMA versus taking medicine, stating she is angry.  She indicates to me that she wants to be discharged at this time.  At this time she is moving all extremities, not in respiratory distress, is alert, and there is no dysarthria.   Daleen Bo, MD 11/28/19 [phone]

## 2019-11-30 ENCOUNTER — Encounter: Payer: Self-pay | Admitting: Family Medicine

## 2019-12-02 ENCOUNTER — Other Ambulatory Visit: Payer: Self-pay

## 2019-12-02 ENCOUNTER — Encounter (INDEPENDENT_AMBULATORY_CARE_PROVIDER_SITE_OTHER): Payer: Self-pay | Admitting: Primary Care

## 2019-12-02 ENCOUNTER — Ambulatory Visit (INDEPENDENT_AMBULATORY_CARE_PROVIDER_SITE_OTHER): Payer: Medicaid Other | Admitting: Primary Care

## 2019-12-02 VITALS — BP 119/72 | HR 99 | Temp 97.3°F | Ht 65.0 in | Wt 149.0 lb

## 2019-12-02 DIAGNOSIS — D509 Iron deficiency anemia, unspecified: Secondary | ICD-10-CM | POA: Diagnosis not present

## 2019-12-02 DIAGNOSIS — G40901 Epilepsy, unspecified, not intractable, with status epilepticus: Secondary | ICD-10-CM

## 2019-12-02 DIAGNOSIS — R0602 Shortness of breath: Secondary | ICD-10-CM

## 2019-12-02 DIAGNOSIS — R040 Epistaxis: Secondary | ICD-10-CM

## 2019-12-02 DIAGNOSIS — E876 Hypokalemia: Secondary | ICD-10-CM

## 2019-12-02 DIAGNOSIS — Z09 Encounter for follow-up examination after completed treatment for conditions other than malignant neoplasm: Secondary | ICD-10-CM

## 2019-12-02 MED ORDER — ALBUTEROL SULFATE HFA 108 (90 BASE) MCG/ACT IN AERS: 2.0000 | INHALATION_SPRAY | RESPIRATORY_TRACT | 1 refills | Status: DC | PRN

## 2019-12-02 MED ORDER — POTASSIUM CHLORIDE CRYS ER 10 MEQ PO TBCR: 10.0000 meq | EXTENDED_RELEASE_TABLET | Freq: Every day | ORAL | 0 refills | Status: DC

## 2019-12-02 MED ORDER — MONTELUKAST SODIUM 10 MG PO TABS: 10.0000 mg | ORAL_TABLET | Freq: Every day | ORAL | 3 refills | Status: DC

## 2019-12-02 MED ORDER — ATROVENT HFA 17 MCG/ACT IN AERS: 2.0000 | INHALATION_SPRAY | Freq: Four times a day (QID) | RESPIRATORY_TRACT | 12 refills | Status: AC | PRN

## 2019-12-02 MED ORDER — FERROUS SULFATE 325 (65 FE) MG PO TABS: 325.0000 mg | ORAL_TABLET | Freq: Every day | ORAL | 3 refills | Status: DC

## 2019-12-02 NOTE — Progress Notes (Signed)
Established Patient Office Visit  Subjective:  Patient ID: Jasmine [name], female    DOB: 02-11-1990  Age: 29 y.o. MRN: [id number]  CC:  Chief Complaint  Patient presents with  . Rash  . Fatigue  . Pain    bones   . Anorexia    HPI Ms. Jasmine [name] is a 29 year old female who presents for rash with pruritus , denies any changes in soaps, detergents downy fabric softener and gain for washing the clothes , deodorants use-Dove sensitive skin or foods.  She has had a history of allergies/allergens to foods and very careful about what she eats.  She also keeps a EpiPen and Benadryl on her and event she has allergic reaction. Feels like her actual bones hurt- takes OTC NSAIDS, and Fatigue .  Past Medical History:  Diagnosis Date  . Asthma   . Cancer ([place])    ovarian  . Complication of anesthesia    "I wake up during anesthesia" (10/14/2015)  . DVT (deep vein thrombosis) in pregnancy   . Epilepsy ([place])    hx of seizures since childhood, most recent 07/29/19  . GERD (gastroesophageal reflux disease)   . Heart murmur    last work up age 48- no symtoms  . Pancreatitis   . Seizures ([place])   . Sickle cell trait ([place])   . Sleep apnea    "used to wear CPAP; don't have one here in [place] since I moved in 2014" (10/14/2015)    Past Surgical History:  Procedure Laterality Date  . APPENDECTOMY    . CESAREAN SECTION  2013  . CESAREAN SECTION N/A 11/08/2014   Procedure: CESAREAN SECTION;  Surgeon: Truett Mainland, DO;  Location: [facility];  Service: Obstetrics;  Laterality: N/A;  . CHOLECYSTECTOMY N/A 10/16/2015   Procedure: LAPAROSCOPIC CHOLECYSTECTOMY WITH POSSIBLE INTRAOPERATIVE CHOLANGIOGRAM;  Surgeon: Donnie Mesa, MD;  Location: [facility];  Service: General;  Laterality: N/A;  . CLOSED REDUCTION MANDIBLE WITH MANDIBULOMA N/A 07/17/2017   Procedure: REDUCTION TMJ WITH INTRAMAXILLARY FIXATION;  Surgeon: Diona Browner, DDS;  Location: [facility];  Service: Oral Surgery;  Laterality: N/A;  .  ESOPHAGOGASTRODUODENOSCOPY (EGD) WITH PROPOFOL N/A 11/15/2016   Procedure: ESOPHAGOGASTRODUODENOSCOPY (EGD) WITH PROPOFOL;  Surgeon: Clarene Essex, MD;  Location: [facility];  Service: Endoscopy;  Laterality: N/A;  . INGUINAL HERNIA REPAIR Bilateral ~ 1996  . NERVE, TENDON AND ARTERY REPAIR Right 09/23/2012   Procedure: I&D and Repair As Necessary/Right Hand and Palm;  Surgeon: Roseanne Kaufman, MD;  Location: [facility];  Service: Orthopedics;  Laterality: Right;  . OMENTECTOMY N/A 12/25/2018   Procedure: OMENTECTOMY;  Surgeon: Lafonda Mosses, MD;  Location: [facility];  Service: Gynecology;  Laterality: N/A;  . SALPINGOOPHORECTOMY N/A 12/25/2018   Procedure: OPEN UNILATERAL  SALPINGO OOPHORECTOMY WITH STAGING;  Surgeon: Lafonda Mosses, MD;  Location: [facility];  Service: Gynecology;  Laterality: N/A;  . TONSILLECTOMY      Family History  Problem Relation Age of Onset  . Cancer Mother        cervical cancer  . Asthma Mother   . Hypertension Father   . Sickle cell anemia Father   . Asthma Sister   . Diabetes Maternal Aunt   . Lymphoma Maternal Grandmother    Outpatient Medications Prior to Visit  Medication Sig Dispense Refill  . lacosamide (VIMPAT) 50 MG TABS tablet Take 1 tablet (50 mg total) by mouth 2 (two) times daily. 60 tablet 11  . rizatriptan (MAXALT-MLT) 10 MG disintegrating tablet  Take 1 tablet (10 mg total) by mouth as needed for migraine. May repeat in 2 hours if needed 9 tablet 11  . topiramate (TOPAMAX) 100 MG tablet     . VIMPAT 50 MG TABS tablet Take 50 mg by mouth 2 (two) times daily.    Marland Kitchen albuterol (PROVENTIL HFA;VENTOLIN HFA) 108 (90 Base) MCG/ACT inhaler Inhale 2 puffs into the lungs every 4 (four) hours as needed for wheezing or shortness of breath. 1 Inhaler 3  . levETIRAcetam (KEPPRA) 750 MG tablet Take 750 mg by mouth 2 (two) times daily.     Marland Kitchen levETIRAcetam (KEPPRA) 750 MG tablet Take 2 tablets (1,500 mg total) by mouth 2 (two) times daily. 360 tablet 4  .  rizatriptan (MAXALT-MLT) 10 MG disintegrating tablet Take 10 mg by mouth 2 (two) times daily as needed.    . topiramate (TOPAMAX) 100 MG tablet Take 1 tablet (100 mg total) by mouth 2 (two) times daily. 180 tablet 4   No facility-administered medications prior to visit.    Allergies  Allergen Reactions  . Cheese Shortness Of Breath  . Chocolate Shortness Of Breath  . Ibuprofen Hives and Shortness Of Breath    Children's ibuprofen  . Ivp Dye [Iodinated Diagnostic Agents] Hives, Shortness Of Breath and Itching  . Latex Anaphylaxis, Swelling and Other (See Comments)    Reaction:  Localized swelling   . Orange Juice [Orange Oil] Shortness Of Breath  . Other Hives and Other (See Comments)    Pt states that she is allergic to all steroids except IV solu-medrol.    Marland Kitchen Peach [Prunus Persica] Anaphylaxis  . Peanuts [Peanut Oil] Anaphylaxis  . Pear Anaphylaxis  . Prednisone Hives  . Raspberry Anaphylaxis  . Tylenol [Acetaminophen] Hives, Shortness Of Breath and Other (See Comments)    Pt states that this is only with the liquid form  . Amoxicillin Hives and Other (See Comments)    Has patient had a PCN reaction causing immediate rash, facial/tongue/throat swelling, SOB or lightheadedness with hypotension: No Has patient had a PCN reaction causing severe rash involving mucus membranes or skin necrosis: No Has patient had a PCN reaction that required hospitalization: No Has patient had a PCN reaction occurring within the last 10 years: No If all of the above answers are "NO", then may proceed with Cephalosporin use.  Marland Kitchen Apricot Flavor Hives  . Doxycycline Hives  . Erythromycin Hives  . Milk Of Magnesia [Magnesium Hydroxide] Hives and Itching  . Penicillins Hives and Other (See Comments)    Has patient had a PCN reaction causing immediate rash, facial/tongue/throat swelling, SOB or lightheadedness with hypotension: No Has patient had a PCN reaction causing severe rash involving mucus membranes  or skin necrosis: No Has patient had a PCN reaction that required hospitalization: No Has patient had a PCN reaction occurring within the last 10 years: No If all of the above answers are "NO", then may proceed with Cephalosporin use.    ROS Review of Systems  Constitutional: Positive for fatigue.  Respiratory: Positive for shortness of breath and wheezing.   Musculoskeletal: Positive for arthralgias.  Skin: Positive for rash.  Neurological: Positive for seizures.      Objective:    Physical Exam Vitals:   12/02/19 1037  BP: 119/72  Pulse: 99  Temp: (!) 97.3 F (36.3 C)  TempSrc: Temporal  SpO2: 99%  Weight: 149 lb (67.6 kg)  Height: 5\' 5"  (1.651 m)   General: Vital signs reviewed.  Patient is well-developed  and well-nourished, in no acute distress and cooperative with exam.  Head: Normocephalic and atraumatic. Eyes: EOMI, conjunctivae normal, no scleral icterus.  Neck: Supple, trachea midline, normal ROM, no JVD, masses, thyromegaly, or carotid bruit present.  Cardiovascular: RRR, S1 normal, S2 normal, no murmurs, gallops, or rubs. Pulmonary/Chest: Clear to auscultation bilaterally, no wheezes, rales, or rhonchi. Abdominal: Soft, non-tender, non-distended, BS +, no masses, organomegaly, or guarding present.  Musculoskeletal: No joint deformities, erythema, or stiffness, ROM full and nontender. Extremities: No lower extremity edema bilaterally,  pulses symmetric and intact bilaterally. No cyanosis or clubbing. Neurological: A&O x3, Strength is normal and symmetric bilaterally, cranial nerve II-XII are grossly intact, no focal motor deficit, sensory intact to light touch bilaterally.  Skin: Warm, dry and intact. No rashes or erythema. Psychiatric: Normal mood and affect. speech and behavior is normal. Cognition and memory are normal.    There are no preventive care reminders to display for this patient.  There are no preventive care reminders to display for this  patient.  Lab Results  Component Value Date   TSH 3.930 08/20/2017   Lab Results  Component Value Date   WBC 30.5 (H) 11/28/2019   HGB 10.0 (L) 11/28/2019   HCT 34.3 (L) 11/28/2019   MCV 77.4 (L) 11/28/2019   PLT 370 11/28/2019   Lab Results  Component Value Date   NA 136 11/28/2019   K 2.8 (L) 11/28/2019   CO2 22 11/28/2019   GLUCOSE 117 (H) 11/28/2019   BUN 6 11/28/2019   CREATININE 0.78 11/28/2019   BILITOT 0.3 11/28/2019   ALKPHOS 51 11/28/2019   AST 25 11/28/2019   ALT 16 11/28/2019   PROT 8.3 (H) 11/28/2019   ALBUMIN 4.8 11/28/2019   CALCIUM 9.3 11/28/2019   ANIONGAP 13 11/28/2019   No results found for: CHOL No results found for: HDL No results found for: [facility] Lab Results  Component Value Date   TRIG 213 (H) 04/22/2015   No results found for: CHOLHDL No results found for: HGBA1C    Assessment & Plan:  [place] was seen today for rash, fatigue, pain and anorexia.  Diagnoses and all orders for this visit:  Hospital discharge follow-up -     Vitamin D, 25-hydroxy -     Protime-INR  Status epilepticus ([place]) Followed by neurology manages her seizures reported since emergency room visit medications have been changed  SOB (shortness of breath) -     ipratropium (ATROVENT HFA) 17 MCG/ACT inhaler; Inhale 2 puffs into the lungs every 6 (six) hours as needed for wheezing. -     ferrous sulfate 325 (65 FE) MG tablet; Take 1 tablet (325 mg total) by mouth daily with breakfast.  asthma -     albuterol (VENTOLIN HFA) 108 (90 Base) MCG/ACT inhaler; Inhale 2 puffs into the lungs every 4 (four) hours as needed for wheezing or shortness of breath. -     montelukast (SINGULAIR) 10 MG tablet; Take 1 tablet (10 mg total) by mouth at bedtime. -     ipratropium (ATROVENT HFA) 17 MCG/ACT inhaler; Inhale 2 puffs into the lungs every 6 (six) hours as needed for wheezing.  Iron deficiency anemia, unspecified iron deficiency anemia type -     ferrous sulfate 325 (65 FE) MG  tablet; Take 1 tablet (325 mg total) by mouth daily with breakfast.  Hypokalemia -     potassium chloride SA (KLOR-CON) 10 MEQ tablet; Take 1 tablet (10 mEq total) by mouth daily.  Nosebleed -  Protime-INR    Meds ordered this encounter  Medications  . albuterol (VENTOLIN HFA) 108 (90 Base) MCG/ACT inhaler    Sig: Inhale 2 puffs into the lungs every 4 (four) hours as needed for wheezing or shortness of breath.    Dispense:  18 g    Refill:  1  . montelukast (SINGULAIR) 10 MG tablet    Sig: Take 1 tablet (10 mg total) by mouth at bedtime.    Dispense:  30 tablet    Refill:  3  . ipratropium (ATROVENT HFA) 17 MCG/ACT inhaler    Sig: Inhale 2 puffs into the lungs every 6 (six) hours as needed for wheezing.    Dispense:  1 each    Refill:  12  . ferrous sulfate 325 (65 FE) MG tablet    Sig: Take 1 tablet (325 mg total) by mouth daily with breakfast.    Dispense:  90 tablet    Refill:  3  . potassium chloride SA (KLOR-CON) 10 MEQ tablet    Sig: Take 1 tablet (10 mEq total) by mouth daily.    Dispense:  90 tablet    Refill:  0    Follow-up: Return in about 3 months (around 03/03/2020) for asthma .    Kerin Perna, NP

## 2019-12-02 NOTE — Patient Instructions (Signed)
Asthma, Adult  Asthma is a long-term (chronic) condition in which the airways get tight and narrow. The airways are the breathing passages that lead from the nose and mouth down into the lungs. A person with asthma will have times when symptoms get worse. These are called asthma attacks. They can cause coughing, whistling sounds when you breathe (wheezing), shortness of breath, and chest pain. They can make it hard to breathe. There is no cure for asthma, but medicines and lifestyle changes can help control it. There are many things that can bring on an asthma attack or make asthma symptoms worse (triggers). Common triggers include:  Mold.  Dust.  Cigarette smoke.  Cockroaches.  Things that can cause allergy symptoms (allergens). These include animal skin flakes (dander) and pollen from trees or grass.  Things that pollute the air. These may include household cleaners, wood smoke, smog, or chemical odors.  Cold air, weather changes, and wind.  Crying or laughing hard.  Stress.  Certain medicines or drugs.  Certain foods such as dried fruit, potato chips, and grape juice.  Infections, such as a cold or the flu.  Certain medical conditions or diseases.  Exercise or tiring activities. Asthma may be treated with medicines and by staying away from the things that cause asthma attacks. Types of medicines may include:  Controller medicines. These help prevent asthma symptoms. They are usually taken every day.  Fast-acting reliever or rescue medicines. These quickly relieve asthma symptoms. They are used as needed and provide short-term relief.  Allergy medicines if your attacks are brought on by allergens.  Medicines to help control the body's defense (immune) system. Follow these instructions at home: Avoiding triggers in your home  Change your heating and air conditioning filter often.  Limit your use of fireplaces and wood stoves.  Get rid of pests (such as roaches and  mice) and their droppings.  Throw away plants if you see mold on them.  Clean your floors. Dust regularly. Use cleaning products that do not smell.  Have someone vacuum when you are not home. Use a vacuum cleaner with a HEPA filter if possible.  Replace carpet with wood, tile, or vinyl flooring. Carpet can trap animal skin flakes and dust.  Use allergy-proof pillows, mattress covers, and box spring covers.  Wash bed sheets and blankets every week in hot water. Dry them in a dryer.  Keep your bedroom free of any triggers.  Avoid pets and keep windows closed when things that cause allergy symptoms are in the air.  Use blankets that are made of polyester or cotton.  Clean bathrooms and kitchens with bleach. If possible, have someone repaint the walls in these rooms with mold-resistant paint. Keep out of the rooms that are being cleaned and painted.  Wash your hands often with soap and water. If soap and water are not available, use hand sanitizer.  Do not allow anyone to smoke in your home. General instructions  Take over-the-counter and prescription medicines only as told by your doctor. ? Talk with your doctor if you have questions about how or when to take your medicines. ? Make note if you need to use your medicines more often than usual.  Do not use any products that contain nicotine or tobacco, such as cigarettes and e-cigarettes. If you need help quitting, ask your doctor.  Stay away from secondhand smoke.  Avoid doing things outdoors when allergen counts are high and when air quality is low.  Wear a ski mask   when doing outdoor activities in the winter. The mask should cover your nose and mouth. Exercise indoors on cold days if you can.  Warm up before you exercise. Take time to cool down after exercise.  Use a peak flow meter as told by your doctor. A peak flow meter is a tool that measures how well the lungs are working.  Keep track of the peak flow meter's readings.  Write them down.  Follow your asthma action plan. This is a written plan for taking care of your asthma and treating your attacks.  Make sure you get all the shots (vaccines) that your doctor recommends. Ask your doctor about a flu shot and a pneumonia shot.  Keep all follow-up visits as told by your doctor. This is important. Contact a doctor if:  You have wheezing, shortness of breath, or a cough even while taking medicine to prevent attacks.  The mucus you cough up (sputum) is thicker than usual.  The mucus you cough up changes from clear or white to yellow, green, gray, or bloody.  You have problems from the medicine you are taking, such as: ? A rash. ? Itching. ? Swelling. ? Trouble breathing.  You need reliever medicines more than 2-3 times a week.  Your peak flow reading is still at 50-79% of your personal best after following the action plan for 1 hour.  You have a fever. Get help right away if:  You seem to be worse and are not responding to medicine during an asthma attack.  You are short of breath even at rest.  You get short of breath when doing very little activity.  You have trouble eating, drinking, or talking.  You have chest pain or tightness.  You have a fast heartbeat.  Your lips or fingernails start to turn blue.  You are light-headed or dizzy, or you faint.  Your peak flow is less than 50% of your personal best.  You feel too tired to breathe normally. Summary  Asthma is a long-term (chronic) condition in which the airways get tight and narrow. An asthma attack can make it hard to breathe.  Asthma cannot be cured, but medicines and lifestyle changes can help control it.  Make sure you understand how to avoid triggers and how and when to use your medicines. This information is not intended to replace advice given to you by your health care provider. Make sure you discuss any questions you have with your health care provider. Document Revised:  03/06/2018 Document Reviewed: 02/06/2016 Elsevier Patient Education  2020 Elsevier Inc.  

## 2019-12-03 ENCOUNTER — Encounter (INDEPENDENT_AMBULATORY_CARE_PROVIDER_SITE_OTHER): Payer: Self-pay | Admitting: Primary Care

## 2019-12-03 LAB — PROTIME-INR
INR: 0.9 (ref 0.9–1.2)
Prothrombin Time: 9.9 s (ref 9.1–12.0)

## 2019-12-03 LAB — VITAMIN D 25 HYDROXY (VIT D DEFICIENCY, FRACTURES): Vit D, 25-Hydroxy: 8.8 ng/mL — ABNORMAL LOW (ref 30.0–100.0)

## 2019-12-04 ENCOUNTER — Other Ambulatory Visit (INDEPENDENT_AMBULATORY_CARE_PROVIDER_SITE_OTHER): Payer: Self-pay | Admitting: Primary Care

## 2019-12-04 MED ORDER — ERGOCALCIFEROL 1.25 MG (50000 UT) PO CAPS: 50000.0000 [IU] | ORAL_CAPSULE | ORAL | 0 refills | Status: DC

## 2019-12-15 ENCOUNTER — Other Ambulatory Visit: Payer: Self-pay

## 2019-12-15 ENCOUNTER — Emergency Department (HOSPITAL_COMMUNITY): Payer: Medicaid Other

## 2019-12-15 ENCOUNTER — Encounter (HOSPITAL_COMMUNITY): Payer: Self-pay

## 2019-12-15 ENCOUNTER — Emergency Department (HOSPITAL_COMMUNITY)
Admission: EM | Admit: 2019-12-15 | Discharge: 2019-12-15 | Disposition: A | Discharge status: Home / Self Care | Payer: Medicaid Other | Attending: Emergency Medicine | Admitting: Emergency Medicine

## 2019-12-15 DIAGNOSIS — Z91010 Allergy to peanuts: Secondary | ICD-10-CM | POA: Insufficient documentation

## 2019-12-15 DIAGNOSIS — Z86001 Personal history of in-situ neoplasm of cervix uteri: Secondary | ICD-10-CM | POA: Insufficient documentation

## 2019-12-15 DIAGNOSIS — G40909 Epilepsy, unspecified, not intractable, without status epilepticus: Secondary | ICD-10-CM | POA: Diagnosis not present

## 2019-12-15 DIAGNOSIS — J4540 Moderate persistent asthma, uncomplicated: Secondary | ICD-10-CM | POA: Diagnosis not present

## 2019-12-15 DIAGNOSIS — R519 Headache, unspecified: Secondary | ICD-10-CM | POA: Diagnosis not present

## 2019-12-15 DIAGNOSIS — R059 Cough, unspecified: Secondary | ICD-10-CM | POA: Insufficient documentation

## 2019-12-15 DIAGNOSIS — R569 Unspecified convulsions: Secondary | ICD-10-CM | POA: Diagnosis not present

## 2019-12-15 DIAGNOSIS — Z91040 Latex allergy status: Secondary | ICD-10-CM | POA: Diagnosis not present

## 2019-12-15 DIAGNOSIS — I10 Essential (primary) hypertension: Secondary | ICD-10-CM | POA: Diagnosis not present

## 2019-12-15 DIAGNOSIS — Z7951 Long term (current) use of inhaled steroids: Secondary | ICD-10-CM | POA: Insufficient documentation

## 2019-12-15 DIAGNOSIS — E876 Hypokalemia: Secondary | ICD-10-CM

## 2019-12-15 DIAGNOSIS — Z8543 Personal history of malignant neoplasm of ovary: Secondary | ICD-10-CM | POA: Diagnosis not present

## 2019-12-15 DIAGNOSIS — Z8669 Personal history of other diseases of the nervous system and sense organs: Secondary | ICD-10-CM | POA: Insufficient documentation

## 2019-12-15 DIAGNOSIS — R9431 Abnormal electrocardiogram [ECG] [EKG]: Secondary | ICD-10-CM | POA: Diagnosis not present

## 2019-12-15 DIAGNOSIS — R404 Transient alteration of awareness: Secondary | ICD-10-CM | POA: Diagnosis not present

## 2019-12-15 LAB — BASIC METABOLIC PANEL
Anion gap: 10 (ref 5–15)
BUN: 6 mg/dL (ref 6–20)
CO2: 21 mmol/L — ABNORMAL LOW (ref 22–32)
Calcium: 9.1 mg/dL (ref 8.9–10.3)
Chloride: 106 mmol/L (ref 98–111)
Creatinine, Ser: 0.7 mg/dL (ref 0.44–1.00)
GFR, Estimated: >60 mL/min (ref >60)
Glucose, Bld: 91 mg/dL (ref 70–99)
Potassium: 2.7 mmol/L — CL (ref 3.5–5.1)
Sodium: 137 mmol/L (ref 135–145)

## 2019-12-15 LAB — CBG MONITORING, ED
Glucose-Capillary: 89 mg/dL (ref 70–99)
Glucose-Capillary: 88 mg/dL (ref 70–99)

## 2019-12-15 LAB — CBC WITH DIFFERENTIAL/PLATELET
Abs Immature Granulocytes: 0.02 10*3/uL (ref 0.00–0.07)
Basophils Absolute: 0.1 10*3/uL (ref 0.0–0.1)
Basophils Relative: 1 %
Eosinophils Absolute: 0.1 10*3/uL (ref 0.0–0.5)
Eosinophils Relative: 1 %
HCT: 29.3 % — ABNORMAL LOW (ref 36.0–46.0)
Hemoglobin: 9.1 g/dL — ABNORMAL LOW (ref 12.0–15.0)
Immature Granulocytes: 0 %
Lymphocytes Relative: 27 %
Lymphs Abs: 3 10*3/uL (ref 0.7–4.0)
MCH: 22.9 pg — ABNORMAL LOW (ref 26.0–34.0)
MCHC: 31.1 g/dL (ref 30.0–36.0)
MCV: 73.6 fL — ABNORMAL LOW (ref 80.0–100.0)
Monocytes Absolute: 0.6 10*3/uL (ref 0.1–1.0)
Monocytes Relative: 6 %
Neutro Abs: 7.4 10*3/uL (ref 1.7–7.7)
Neutrophils Relative %: 65 %
Platelets: 325 10*3/uL (ref 150–400)
RBC: 3.98 MIL/uL (ref 3.87–5.11)
RDW: 18.6 % — ABNORMAL HIGH (ref 11.5–15.5)
WBC: 11.2 10*3/uL — ABNORMAL HIGH (ref 4.0–10.5)
nRBC: 0 % (ref 0.0–0.2)

## 2019-12-15 LAB — URINALYSIS, ROUTINE W REFLEX MICROSCOPIC
Bilirubin Urine: NEGATIVE
Glucose, UA: NEGATIVE mg/dL
Hgb urine dipstick: NEGATIVE
Ketones, ur: NEGATIVE mg/dL
Leukocytes,Ua: NEGATIVE
Nitrite: NEGATIVE
Protein, ur: NEGATIVE mg/dL
Specific Gravity, Urine: 1.01 (ref 1.005–1.030)
pH: 6 (ref 5.0–8.0)

## 2019-12-15 LAB — MAGNESIUM: Magnesium: 2.1 mg/dL (ref 1.7–2.4)

## 2019-12-15 LAB — PREGNANCY, URINE: Preg Test, Ur: NEGATIVE

## 2019-12-15 MED ORDER — ONDANSETRON HCL 4 MG/2ML IJ SOLN: 4.0000 mg | Freq: Once | INTRAMUSCULAR | Status: DC

## 2019-12-15 MED ORDER — METOCLOPRAMIDE HCL 5 MG/ML IJ SOLN
10.0000 mg | Freq: Once | INTRAMUSCULAR | Status: AC
  Administered 2019-12-15: 10 mg via INTRAVENOUS
  Filled 2019-12-15: qty 2

## 2019-12-15 MED ORDER — POTASSIUM CHLORIDE 10 MEQ/100ML IV SOLN
10.0000 meq | INTRAVENOUS | Status: AC
  Administered 2019-12-15 (×2): 10 meq via INTRAVENOUS
  Filled 2019-12-15 (×2): qty 100

## 2019-12-15 MED ORDER — LACOSAMIDE 50 MG PO TABS
50.0000 mg | ORAL_TABLET | Freq: Once | ORAL | Status: AC
  Administered 2019-12-15: 50 mg via ORAL
  Filled 2019-12-15: qty 1

## 2019-12-15 MED ORDER — ACETAMINOPHEN 500 MG PO TABS
1000.0000 mg | ORAL_TABLET | Freq: Once | ORAL | Status: AC
  Administered 2019-12-15: 1000 mg via ORAL
  Filled 2019-12-15: qty 2

## 2019-12-15 MED ORDER — POTASSIUM CHLORIDE CRYS ER 20 MEQ PO TBCR
40.0000 meq | EXTENDED_RELEASE_TABLET | Freq: Once | ORAL | Status: AC
  Administered 2019-12-15: 40 meq via ORAL
  Filled 2019-12-15: qty 2

## 2019-12-15 MED ORDER — LEVETIRACETAM IN NACL 1000 MG/100ML IV SOLN
1000.0000 mg | Freq: Once | INTRAVENOUS | Status: AC
  Administered 2019-12-15: 1000 mg via INTRAVENOUS
  Filled 2019-12-15: qty 100

## 2019-12-15 MED ORDER — LACTATED RINGERS IV BOLUS
1000.0000 mL | Freq: Once | INTRAVENOUS | Status: AC
  Administered 2019-12-15: 1000 mL via INTRAVENOUS

## 2019-12-15 MED ORDER — POTASSIUM CHLORIDE CRYS ER 20 MEQ PO TBCR: 20.0000 meq | EXTENDED_RELEASE_TABLET | Freq: Two times a day (BID) | ORAL | 0 refills | Status: DC

## 2019-12-15 NOTE — ED Provider Notes (Signed)
[facility] DEPT Provider Note   CSN: [id number] Arrival date & time: 12/15/19  0209     History Chief Complaint  Patient presents with   Seizures    Jasmine [name] is a 29 y.o. female with a history of epilepsy, pancreatitis, sickle cell trait, asthma, and ovarian cancer who presents to the emergency department via EMS with a chief complaint of seizures.  EMS reports that patient called out due to seizure-like activity tonight.  EMS reports that she had a 3-minute tonic-clonic seizure in route with EMS.  She was given 5 mg of IM Versed and seizure resolved.  She was postictal on arrival to the ER.  She is unable to provide any additional history at this time.  Spoke with the patient's husband, Richardson Landry, who reports that the patient was having seizures for 2 or 3 days about a week and a half ago.  She was seen in the ER on 1113.  She spoke with her neurology team.  They advised her to stop taking cough and cold medicine.  She stopped these medications since seizures seem to resolve.  He notes that she has been out of her home Vimpat for the last 1 to 2 days.  They have a refill available at her pharmacy, but the pharmacy has yet to fill it.  He reports that seizures returned 1 to 2 days ago around the time that she ran out of her home Vimpat.  He reports that until the last 1 to 2 days, she has been at her baseline.  She has been sleeping well.  She has had mild nonproductive cough, but has otherwise had no changes in her health.  Level 5 caveat secondary to altered mental status.  The history is provided by the patient and medical records. No language interpreter was used.       Past Medical History:  Diagnosis Date   Asthma    Cancer ([place])    ovarian   Complication of anesthesia    "I wake up during anesthesia" (10/14/2015)   DVT (deep vein thrombosis) in pregnancy    Epilepsy ([place])    hx of seizures since childhood, most recent 07/29/19    GERD (gastroesophageal reflux disease)    Heart murmur    last work up age 68- no symtoms   Pancreatitis    Seizures ([place])    Sickle cell trait ([place])    Sleep apnea    "used to wear CPAP; don't have one here in [place] since I moved in 2014" (10/14/2015)    Patient Active Problem List   Diagnosis Date Noted   Status epilepticus ([place]) 03/28/2019   Moderate persistent asthma 03/19/2019   Ovarian tumor of borderline malignancy, 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   Pelvic mass in female 12/25/2018   Abnormal uterine bleeding    Pelvic mass 12/22/2018   Dehydration 12/22/2018   Asthma exacerbation 12/01/2017   Acute hypercapnic respiratory failure ([place]) 12/01/2017   Exocrine pancreatic insufficiency 12/01/2017   Facial contusion 07/17/2017   Closed dislocation of right jaw 07/17/2017   Nausea with hives 07/09/2017   History of DVT (deep vein thrombosis) in pregnancy ([place]) 05/29/2017   Sickle cell trait ([place]) 05/29/2017   Acute asthma exacerbation 05/29/2017   Chronic pancreatitis ([place]) 05/29/2017   Seizures ([place]) 05/29/2017   Tobacco abuse 05/29/2017   Acute on chronic respiratory failure with hypoxemia ([place]) 05/29/2017   Pneumonia 05/04/2017   SOB (shortness of breath) 04/08/2017  Cough 04/07/2017   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   Acute on chronic respiratory failure with hypoxia ([facility]) 03/10/2017   Colitis 11/12/2016   Seizure ([place]) 11/12/2016   Calculus of bile duct with acute cholecystitis with obstruction 10/14/2015   Adjustment disorder with mixed anxiety and depressed mood 04/26/2015   Low blood potassium 04/19/2015   Asthma with acute exacerbation 04/19/2015   Asthma 01/03/2015   Asthma, chronic, unspecified asthma severity, with acute exacerbation 01/03/2015   Influenza-like illness 01/03/2015   Status post cesarean section 11/08/2014   Previous cesarean section complicating pregnancy, antepartum condition  or complication    GERD (gastroesophageal reflux disease)    Gastroesophageal reflux disease without esophagitis    Abnormal biochemical finding on antenatal screening of mother    Abnormal quad screen    Echogenic focus of heart of fetus affecting antepartum care of mother    Drug use affecting pregnancy, antepartum 05/19/2014   Seizure disorder during pregnancy, antepartum ([place]) 05/13/2014   Supervision of high risk pregnancy, antepartum 35/57/3220   Asthma complicating pregnancy, antepartum 05/13/2014   History of food anaphylaxis 05/13/2014   Hypokalemia 04/08/2014   Seizure disorder, sept 2015 last seizure 01/19/2013   Microcytic anemia 01/19/2013   Leukocytosis 01/19/2013    Past Surgical History:  Procedure Laterality Date   APPENDECTOMY     CESAREAN SECTION  2013   CESAREAN SECTION N/A 11/08/2014   Procedure: CESAREAN SECTION;  Surgeon: Truett Mainland, DO;  Location: [facility];  Service: Obstetrics;  Laterality: N/A;   CHOLECYSTECTOMY N/A 10/16/2015   Procedure: LAPAROSCOPIC CHOLECYSTECTOMY WITH POSSIBLE INTRAOPERATIVE CHOLANGIOGRAM;  Surgeon: Donnie Mesa, MD;  Location: [facility];  Service: General;  Laterality: N/A;   CLOSED REDUCTION MANDIBLE WITH MANDIBULOMA N/A 07/17/2017   Procedure: REDUCTION TMJ WITH INTRAMAXILLARY FIXATION;  Surgeon: Diona Browner, DDS;  Location: [facility];  Service: Oral Surgery;  Laterality: N/A;   ESOPHAGOGASTRODUODENOSCOPY (EGD) WITH PROPOFOL N/A 11/15/2016   Procedure: ESOPHAGOGASTRODUODENOSCOPY (EGD) WITH PROPOFOL;  Surgeon: Clarene Essex, MD;  Location: [facility];  Service: Endoscopy;  Laterality: N/A;   INGUINAL HERNIA REPAIR Bilateral ~ 1996   NERVE, TENDON AND ARTERY REPAIR Right 09/23/2012   Procedure: I&D and Repair As Necessary/Right Hand and Palm;  Surgeon: Roseanne Kaufman, MD;  Location: [facility];  Service: Orthopedics;  Laterality: Right;   OMENTECTOMY N/A 12/25/2018   Procedure: OMENTECTOMY;  Surgeon: Lafonda Mosses, MD;   Location: [facility];  Service: Gynecology;  Laterality: N/A;   SALPINGOOPHORECTOMY N/A 12/25/2018   Procedure: OPEN UNILATERAL  SALPINGO OOPHORECTOMY WITH STAGING;  Surgeon: Lafonda Mosses, MD;  Location: [facility];  Service: Gynecology;  Laterality: N/A;   TONSILLECTOMY       OB History    Gravida  3   Para  3   Term  3   Preterm  0   AB  0   Living  3     SAB  0   TAB  0   Ectopic  0   Multiple      Live Births  3           Family History  Problem Relation Age of Onset   Cancer Mother        cervical cancer   Asthma Mother    Hypertension Father    Sickle cell anemia Father    Asthma Sister    Diabetes Maternal Aunt    Lymphoma Maternal Grandmother     Social History   Tobacco Use   Smoking  status: Never Smoker   Smokeless tobacco: Never Used  Vaping Use   Vaping Use: Never used  Substance Use Topics   Alcohol use: Not Currently   Drug use: Not Currently    Comment: pos urine drug screens    Home Medications Prior to Admission medications   Medication Sig Start Date End Date Taking? Authorizing Provider  albuterol (VENTOLIN HFA) 108 (90 Base) MCG/ACT inhaler Inhale 2 puffs into the lungs every 4 (four) hours as needed for wheezing or shortness of breath. 12/02/19   Kerin Perna, NP  ergocalciferol (VITAMIN D2) 1.25 MG (50000 UT) capsule Take 1 capsule (50,000 Units total) by mouth once a week. 12/04/19   Kerin Perna, NP  ferrous sulfate 325 (65 FE) MG tablet Take 1 tablet (325 mg total) by mouth daily with breakfast. 12/02/19   Kerin Perna, NP  ipratropium (ATROVENT HFA) 17 MCG/ACT inhaler Inhale 2 puffs into the lungs every 6 (six) hours as needed for wheezing. 12/02/19   Kerin Perna, NP  lacosamide (VIMPAT) 50 MG TABS tablet Take 1 tablet (50 mg total) by mouth 2 (two) times daily. 10/06/19   Lomax, Amy, NP  montelukast (SINGULAIR) 10 MG tablet Take 1 tablet (10 mg total) by mouth at bedtime. 12/02/19    Kerin Perna, NP  potassium chloride SA (KLOR-CON) 20 MEQ tablet Take 1 tablet (20 mEq total) by mouth 2 (two) times daily for 5 days. 12/15/19 12/20/19  Eidan Muellner A, PA-C  rizatriptan (MAXALT-MLT) 10 MG disintegrating tablet Take 1 tablet (10 mg total) by mouth as needed for migraine. May repeat in 2 hours if needed 08/04/19   Penumalli, Earlean Polka, MD  topiramate (TOPAMAX) 100 MG tablet  11/19/19   [provider]  VIMPAT 50 MG TABS tablet Take 50 mg by mouth 2 (two) times daily. 10/09/19   [provider]  cetirizine (ZYRTEC) 10 MG tablet Take 1 tablet (10 mg total) by mouth daily. 12/11/17 08/04/19  Ok Edwards, PA-C    Allergies    Cheese, Chocolate, Ibuprofen, Ivp dye [iodinated diagnostic agents], Latex, Orange juice [orange oil], Other, Peach [prunus persica], Peanuts [peanut oil], Pear, Prednisone, Raspberry, Tylenol [acetaminophen], Amoxicillin, Apricot flavor, Doxycycline, Erythromycin, Milk of magnesia [magnesium hydroxide], and Penicillins  Review of Systems   Review of Systems  Unable to perform ROS: Mental status change    Physical Exam Updated Vital Signs BP (!) 93/59    Pulse 70    Temp 98.4 F (36.9 C) (Oral)    Resp 18    Ht 5\' 5"  (1.651 m)    Wt 67.6 kg    SpO2 100%    BMI 24.79 kg/m   Physical Exam Vitals and nursing note reviewed.  Constitutional:      General: She is not in acute distress.    Appearance: She is not ill-appearing, toxic-appearing or diaphoretic.     Comments: Strong odor of cannabis in the room  HENT:     Head: Normocephalic.  Eyes:     Conjunctiva/sclera: Conjunctivae normal.     Pupils: Pupils are equal, round, and reactive to light.  Cardiovascular:     Rate and Rhythm: Normal rate and regular rhythm.     Pulses: Normal pulses.     Heart sounds: No murmur heard.  No friction rub. No gallop.   Pulmonary:     Effort: Pulmonary effort is normal. No respiratory distress.     Breath sounds: No stridor. No wheezing,  rhonchi  or rales.  Chest:     Chest wall: No tenderness.  Abdominal:     General: There is no distension.     Palpations: Abdomen is soft.     Tenderness: There is no abdominal tenderness. There is no guarding.  Musculoskeletal:        General: Injury:      Cervical back: Neck supple.     Right lower leg: No edema.     Left lower leg: No edema.  Skin:    General: Skin is warm.     Findings: No rash.  Neurological:     Comments: Somnolent, but arouses to loud voice then falls back asleep.  She is intermittently able to follow commands.  Psychiatric:        Behavior: Behavior normal.    ED Results / Procedures / Treatments   Labs (all labs ordered are listed, but only abnormal results are displayed) Labs Reviewed  BASIC METABOLIC PANEL - Abnormal; Notable for the following components:      Result Value   Potassium 2.7 (*)    CO2 21 (*)    All other components within normal limits  CBC WITH DIFFERENTIAL/PLATELET - Abnormal; Notable for the following components:   WBC 11.2 (*)    Hemoglobin 9.1 (*)    HCT 29.3 (*)    MCV 73.6 (*)    MCH 22.9 (*)    RDW 18.6 (*)    All other components within normal limits  MAGNESIUM  PREGNANCY, URINE  URINALYSIS, ROUTINE W REFLEX MICROSCOPIC  CBG MONITORING, ED  CBG MONITORING, ED    EKG EKG Interpretation  Date/Time:  Tuesday December 15 2019 05:21:52 EST Ventricular Rate:  61 PR Interval:    QRS Duration: 77 QT Interval:  433 QTC Calculation: 437 R Axis:   63 Text Interpretation: Sinus rhythm Normal ECG When compared with ECG of 07/04/2019 No significant change was found Confirmed by Delora Fuel ([id number]) on 12/15/2019 5:46:05 AM   Radiology No results found.  Procedures Procedures (including critical care time)  Medications Ordered in ED Medications  potassium chloride SA (KLOR-CON) CR tablet 40 mEq (has no administration in time range)  levETIRAcetam (KEPPRA) IVPB 1000 mg/100 mL premix (0 mg Intravenous Stopped  12/15/19 0435)  potassium chloride 10 mEq in 100 mL IVPB (10 mEq Intravenous New Bag/Given 12/15/19 7253)    ED Course  I have reviewed the triage vital signs and the nursing notes.  Pertinent labs & imaging results that were available during my care of the patient were reviewed by me and considered in my medical decision making (see chart for details).    MDM Rules/Calculators/A&P                          29 year old female with a history of epilepsy, pancreatitis, sickle cell trait, asthma, and ovarian cancer brought in by EMS for seizure-like activity.  Patient had a witnessed 3-minute tonic-clonic seizure in route and was given IM Versed.  She was postictal on arrival to the ER.  Patient's mentation did somewhat improve when she was able to open her eyes to voice, but was and only intermittently following commands.  I suspect this may be exacerbated since the patient was admitted in the middle of the night.  She has not no further witnessed episodes of seizure-like activity and was loaded with a dose of IV Keppra in the ER.  Patient's breakthrough seizures have only been ongoing for the  last 1 to 2 days, which is when she ran out of her home Vimpat.  Suspect breakthrough seizures may be secondary to medication compliance.  Her husband states that she has a refill of the medication, but the pharmacy has not yet filled the medication.  Labs have been reviewed and independently interpreted by me.  Glucose was initially 91, remained stable at 88.  No hypoglycemia.  Potassium was low at 2.7.  She was given 2 runs of IV potassium.  When mentation improves, she will be given a dose of oral potassium and discharged home with a 5-day course.  Magnesium is normal.  EKG unchanged from previous and no evidence of prolonged QTC.  Labs are otherwise at her baseline.  Leukocytosis from labs on 11/13 has since resolved.  Patient care transferred to [place] at the end of my shift to ensure that patient's  mentation continues to improve.  When she is back to baseline, she will likely be appropriate for discharge home.  Patient presentation, ED course, and plan of care discussed with review of all pertinent labs and imaging. Please see his/her note for further details regarding further ED course and disposition.  Final Clinical Impression(s) / ED Diagnoses Final diagnoses:  Seizure [facility])    Rx / DC Orders ED Discharge Orders         Ordered    potassium chloride SA (KLOR-CON) 20 MEQ tablet  2 times daily        12/15/19 0803           Joline Maxcy A, PA-C 88/33/74 [id number]    Delora Fuel, MD 60/47/99 2243

## 2019-12-15 NOTE — ED Notes (Signed)
Date and time results received: 12/15/19 0443 (use smartphrase ".now" to insert current time)  Test: potassium  Critical Value: 2.7  Name of Provider Notified: Mia, PA  Orders Received? Or Actions Taken?: Orders Received - See Orders for details

## 2019-12-15 NOTE — ED Provider Notes (Signed)
Jasmine [name] is a 29 y.o. female, presenting to the ED following a seizure.  HPI from [employer], PA-C: "Jasmine [name] is a 29 y.o. female with a history of epilepsy, pancreatitis, sickle cell trait, asthma, and ovarian cancer who presents to the emergency department via EMS with a chief complaint of seizures.  EMS reports that patient called out due to seizure-like activity tonight.  EMS reports that she had a 3-minute tonic-clonic seizure in route with EMS.  She was given 5 mg of IM Versed and seizure resolved.  She was postictal on arrival to the ER.  She is unable to provide any additional history at this time.  Spoke with the patient's husband, Richardson Landry, who reports that the patient was having seizures for 2 or 3 days about a week and a half ago.  She was seen in the ER on 1113.  She spoke with her neurology team.  They advised her to stop taking cough and cold medicine.  She stopped these medications since seizures seem to resolve.  He notes that she has been out of her home Vimpat for the last 1 to 2 days.  They have a refill available at her pharmacy, but the pharmacy has yet to fill it.  He reports that seizures returned 1 to 2 days ago around the time that she ran out of her home Vimpat.  He reports that until the last 1 to 2 days, she has been at her baseline.  She has been sleeping well.  She has had mild nonproductive cough, but has otherwise had no changes in her health.  Level 5 caveat secondary to altered mental status."  Past Medical History:  Diagnosis Date  . Asthma   . Cancer ([place])    ovarian  . Complication of anesthesia    "I wake up during anesthesia" (10/14/2015)  . DVT (deep vein thrombosis) in pregnancy   . Epilepsy ([place])    hx of seizures since childhood, most recent 07/29/19  . GERD (gastroesophageal reflux disease)   . Heart murmur    last work up age 58- no symtoms  . Pancreatitis   . Seizures ([place])   . Sickle cell trait ([place])   . Sleep apnea     "used to wear CPAP; don't have one here in [place] since I moved in 2014" (10/14/2015)    Physical Exam  BP 121/70   Pulse 71   Temp 98.4 F (36.9 C) (Oral)   Resp 18   Ht 5\' 5"  (1.651 m)   Wt 67.6 kg   SpO2 100%   BMI 24.79 kg/m   Physical Exam Vitals and nursing note reviewed.  Constitutional:      General: She is not in acute distress.    Appearance: She is well-developed. She is not diaphoretic.  HENT:     Head: Normocephalic and atraumatic.     Mouth/Throat:     Mouth: Mucous membranes are moist.     Pharynx: Oropharynx is clear.  Eyes:     Conjunctiva/sclera: Conjunctivae normal.  Cardiovascular:     Rate and Rhythm: Normal rate and regular rhythm.     Pulses: Normal pulses.          Radial pulses are 2+ on the right side and 2+ on the left side.       Posterior tibial pulses are 2+ on the right side and 2+ on the left side.     Heart sounds: Normal heart sounds.     Comments:  Tactile temperature in the extremities appropriate and equal bilaterally. Pulmonary:     Effort: Pulmonary effort is normal. No respiratory distress.     Breath sounds: Normal breath sounds.  Abdominal:     Palpations: Abdomen is soft.     Tenderness: There is no abdominal tenderness. There is no guarding.  Musculoskeletal:     Cervical back: Neck supple.     Right lower leg: No edema.     Left lower leg: No edema.  Skin:    General: Skin is warm and dry.  Neurological:     Mental Status: She is alert and oriented to person, place, and time.     Comments: Patient drowsy, but arousable to voice.  Follows simple commands.  After patient became more awake:  No noted acute cognitive deficit. Sensation grossly intact to light touch in the extremities.   Grip strengths equal bilaterally.   Strength 5/5 in all extremities.  No gait disturbance.  Coordination intact.  Cranial nerves III-XII grossly intact.  Handles oral secretions without noted difficulty.  No noted phonation or speech  deficit. No facial droop.   Psychiatric:        Mood and Affect: Mood and affect normal.        Speech: Speech normal.        Behavior: Behavior normal.     ED Course/Procedures     Procedures   Abnormal Labs Reviewed  BASIC METABOLIC PANEL - Abnormal; Notable for the following components:      Result Value   Potassium 2.7 (*)    CO2 21 (*)    All other components within normal limits  CBC WITH DIFFERENTIAL/PLATELET - Abnormal; Notable for the following components:   WBC 11.2 (*)    Hemoglobin 9.1 (*)    HCT 29.3 (*)    MCV 73.6 (*)    MCH 22.9 (*)    RDW 18.6 (*)    All other components within normal limits    CT Head Wo Contrast  Result Date: 12/15/2019 CLINICAL DATA:  Headache. EXAM: CT HEAD WITHOUT CONTRAST TECHNIQUE: Contiguous axial images were obtained from the base of the skull through the vertex without intravenous contrast. COMPARISON:  CT head November 28, 2019. FINDINGS: Brain: No evidence of acute infarction, hemorrhage, hydrocephalus, extra-axial collection or mass lesion/mass effect. Vascular: No hyperdense vessel or unexpected calcification. Skull: No acute fracture. Sinuses/Orbits: Left frontal sinus retention cyst versus polyp. Remaining visualized sinuses are clear. No acute orbital abnormality. Other: No mastoid effusions. IMPRESSION: No evidence of acute intracranial abnormality. Electronically Signed   By: Margaretha Sheffield MD   On: 12/15/2019 11:27   CT Head Wo Contrast  Result Date: 11/28/2019 CLINICAL DATA:  Seizure EXAM: CT HEAD WITHOUT CONTRAST TECHNIQUE: Contiguous axial images were obtained from the base of the skull through the vertex without intravenous contrast. COMPARISON:  None. FINDINGS: Brain: Normal anatomic configuration. No abnormal intra or extra-axial mass lesion or fluid collection. No abnormal mass effect or midline shift. No evidence of acute intracranial hemorrhage or infarct. Ventricular size is normal. Cerebellum unremarkable.  Vascular: Unremarkable Skull: Intact Sinuses/Orbits: Small mucous retention cyst is seen within the left frontal sinus. The paranasal sinuses are otherwise clear. Orbits are unremarkable. Other: Mastoid air cells and middle ear cavities are clear. IMPRESSION: Normal head CT.  No acute intracranial abnormality. Electronically Signed   By: Fidela Salisbury MD   On: 11/28/2019 01:30    MDM   Patient care handoff report received from Joline Maxcy,  PA-C. Plan: Patient thought to be sedated due to Versed.  Reevaluate until clinically functional.   Patient initially drowsy, but not confused.  She would fall asleep during assessment. I checked on this patient multiple times while she was under my care and she steadily improved. When we attempted to initially walk patient, she stated she felt nauseous, lightheaded, and had a headache.  She states headache would not be unusual for her following a seizure and she usually takes Tylenol for her headaches. Headache did not resolve with Tylenol, therefore head CT was ordered. Head CT without acute abnormalities.  Following IV fluids and Reglan patient's symptoms resolved completely. Spontaneously alert and ambulates without difficulty.  Patient given a dose of Vimpat prior to discharge.  Her husband was able to confirm that the pharmacy would have her Vimpat prescription by 6 PM today.  The patient was given instructions for home care as well as return precautions. Patient voices understanding of these instructions, accepts the plan, and is comfortable with discharge.  Findings and plan of care discussed with Sherwood Gambler, MD.   Vitals:   12/15/19 1215 12/15/19 1230 12/15/19 1251 12/15/19 1252  BP: 118/68 108/69 116/72   Pulse: 72 91 79   Resp: 16 18 (!) 9 13  Temp:      TempSrc:      SpO2: 100% 100% 100%   Weight:      Height:          Lorayne Bender, PA-C 12/15/19 1619    Sherwood Gambler, MD 12/21/19 2025

## 2019-12-15 NOTE — ED Triage Notes (Signed)
Pt reports multiple seizures since 11/13 encounter. Pt sts leaving ama and not returning because she knew she would not stay. Pt reports last seizure was witnessed on EMS truck. EMS administered 5mg  IM versed for tonic clonic lasting approx 3 minutes.

## 2019-12-15 NOTE — Discharge Instructions (Addendum)
Be sure to take your seizure medication. Do not drink alcohol as this can lower the seizure threshold and make seizures more likely. Drug use can do the same. Follow-up with your primary care provider or neurologist on this matter.  Low potassium (hypokalemia): Your potassium was lower than normal. Please refer to the attached pages regarding changes in diet to increase your potassium. You have also been prescribed a course of potassium supplementation. Finish this course of medication and recommend having your potassium rechecked through a primary care provider.

## 2019-12-16 DIAGNOSIS — R2689 Other abnormalities of gait and mobility: Secondary | ICD-10-CM | POA: Diagnosis not present

## 2019-12-16 DIAGNOSIS — G4739 Other sleep apnea: Secondary | ICD-10-CM | POA: Diagnosis not present

## 2019-12-16 DIAGNOSIS — J45901 Unspecified asthma with (acute) exacerbation: Secondary | ICD-10-CM | POA: Diagnosis not present

## 2019-12-16 DIAGNOSIS — J45909 Unspecified asthma, uncomplicated: Secondary | ICD-10-CM | POA: Diagnosis not present

## 2020-01-01 ENCOUNTER — Encounter: Payer: Self-pay | Admitting: Gynecologic Oncology

## 2020-01-04 ENCOUNTER — Other Ambulatory Visit: Payer: Self-pay | Admitting: Gynecologic Oncology

## 2020-01-04 ENCOUNTER — Inpatient Hospital Stay: Payer: Medicaid Other | Attending: Gynecologic Oncology | Admitting: Gynecologic Oncology

## 2020-01-04 ENCOUNTER — Inpatient Hospital Stay: Payer: Medicaid Other

## 2020-01-04 ENCOUNTER — Ambulatory Visit (HOSPITAL_COMMUNITY)
Admission: RE | Admit: 2020-01-04 | Discharge: 2020-01-04 | Disposition: A | Discharge status: Home / Self Care | Payer: Medicaid Other | Source: Ambulatory Visit | Attending: Gynecologic Oncology | Admitting: Gynecologic Oncology

## 2020-01-04 ENCOUNTER — Other Ambulatory Visit: Payer: Self-pay

## 2020-01-04 DIAGNOSIS — D3912 Neoplasm of uncertain behavior of left ovary: Secondary | ICD-10-CM

## 2020-01-04 DIAGNOSIS — Z8543 Personal history of malignant neoplasm of ovary: Secondary | ICD-10-CM | POA: Diagnosis not present

## 2020-01-04 DIAGNOSIS — Z86018 Personal history of other benign neoplasm: Secondary | ICD-10-CM | POA: Diagnosis not present

## 2020-01-04 LAB — CEA (IN HOUSE-CHCC): CEA (CHCC-In House): <1 ng/mL (ref 0.00–5.00)

## 2020-01-04 NOTE — Progress Notes (Signed)
Patient checked in for ultrasound, was waiting in the lobby. Did not respond when office staff called for her in the waiting room at 13:00 and 13:15. When she was found, close to 13: 30, she had received my MyChart messages with her normal CEA and ultrasound results from today.  At that time, she had already called for a ride that was 4 minutes away because she thought that I did not want to see her.  My office staff asked that she call or send a my chart message to let me know how she is doing.  We will get her rescheduled for a visit in the upcoming several months.  Jeral Pinch MD Gynecologic Oncology

## 2020-01-06 ENCOUNTER — Other Ambulatory Visit (INDEPENDENT_AMBULATORY_CARE_PROVIDER_SITE_OTHER): Payer: Self-pay | Admitting: Primary Care

## 2020-01-06 MED ORDER — ALBUTEROL SULFATE HFA 108 (90 BASE) MCG/ACT IN AERS: 2.0000 | INHALATION_SPRAY | RESPIRATORY_TRACT | 1 refills | Status: DC | PRN

## 2020-01-11 ENCOUNTER — Encounter: Payer: Self-pay | Admitting: Diagnostic Neuroimaging

## 2020-01-11 ENCOUNTER — Ambulatory Visit: Payer: Medicaid Other | Admitting: Diagnostic Neuroimaging

## 2020-01-11 VITALS — BP 107/70 | HR 80 | Ht 65.0 in | Wt 136.2 lb

## 2020-01-11 DIAGNOSIS — G40909 Epilepsy, unspecified, not intractable, without status epilepticus: Secondary | ICD-10-CM | POA: Diagnosis not present

## 2020-01-11 MED ORDER — TOPIRAMATE 50 MG PO TABS
50.0000 mg | ORAL_TABLET | Freq: Two times a day (BID) | ORAL | 12 refills | Status: DC
Start: 2020-01-11 — End: 2020-08-11

## 2020-01-11 MED ORDER — LEVETIRACETAM 750 MG PO TABS: 1500.0000 mg | ORAL_TABLET | Freq: Two times a day (BID) | ORAL | 4 refills | Status: DC

## 2020-01-11 MED ORDER — LACOSAMIDE 100 MG PO TABS
100.0000 mg | ORAL_TABLET | Freq: Two times a day (BID) | ORAL | 5 refills | Status: DC
Start: 2020-01-11 — End: 2020-08-11

## 2020-01-11 NOTE — Patient Instructions (Addendum)
  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) - continue levetiracetam 1500mg  twice a day - increase vimpat to 100mg  twice a day  - caution with living situation;  recommend to get help from family, friends, neighbors; currently living at home with 3 children (ages 4-87yrs); husband works out of town most of the month - no driving until seizure free x 6 months   MIGRAINE WITH AURA - reduce topiramate to 50mg  twice a day (due to anorexia) - start rizatriptan as needed for migraine   LEUKOCYTOSIS (high white blood cells) / ANEMIA (low red blood cells) - follow up with PCP and heme/oncology   HYPOKALEMIA (low potassium) - follow up with PCP

## 2020-01-11 NOTE — Progress Notes (Signed)
[facility]  PATIENT: Jasmine [name] DOB: 26-Dec-1990  REFERRING CLINICIAN: Clent Demark, PA-C HISTORY FROM: patient  REASON FOR VISIT: follow up   HISTORICAL  CHIEF COMPLAINT:  Chief Complaint  Patient presents with  . Seizures    Rm 7, 6 month FU "have been having seizures a lot, no missed doses of meds"    HISTORY OF PRESENT ILLNESS:   UPDATE (01/11/20, [facility]): Since last visit, continues with frequent seizures (4-6 per month). No alleviating or aggravating factors. Tolerating meds. Multiple ER visits. More weight loss, fatigue, stress.   PRIOR HPI: 29 year old female here for evaluation of seizures.  Patient had onset of seizures around 29 years old, with no particular warning, generalized convulsions.  Sometimes she would have confusion spells where she would walk around dazed, unresponsive and unaware.  Patient was initially on Tegretol and then Keppra, then back to Tegretol, then back to Keppra.  She was started on topiramate to help with seizures and migraine.  Patient was living in [place], managed by neurologist there.  In the past year she is moved to [place].  She lives with 3 daughters ages 52 to 23 years old.  Her husband travels back and forth between [place] and [place] for work.  Patient has had multiple seizures since that time.  Most of the seizures have occurred in the setting of missed medications, running out of medications, stress factors.     REVIEW OF SYSTEMS: Full 14 system review of systems performed and negative with exception of: As per HPI.  ALLERGIES: Allergies  Allergen Reactions  . Cheese Shortness Of Breath  . Chocolate Shortness Of Breath  . Ibuprofen Hives and Shortness Of Breath    Children's ibuprofen  . Ivp Dye [Iodinated Diagnostic Agents] Hives, Shortness Of Breath and Itching  . Latex Anaphylaxis, Swelling and Other (See Comments)    Reaction:  Localized swelling   . Orange Juice [Orange Oil]  Shortness Of Breath  . Other Hives and Other (See Comments)    Pt states that she is allergic to all steroids except IV solu-medrol.    Marland Kitchen Peach [Prunus Persica] Anaphylaxis  . Peanuts [Peanut Oil] Anaphylaxis  . Pear Anaphylaxis  . Prednisone Hives  . Raspberry Anaphylaxis  . Tylenol [Acetaminophen] Hives, Shortness Of Breath and Other (See Comments)    Pt states that this is only with the liquid form  . Amoxicillin Hives and Other (See Comments)    Has patient had a PCN reaction causing immediate rash, facial/tongue/throat swelling, SOB or lightheadedness with hypotension: No Has patient had a PCN reaction causing severe rash involving mucus membranes or skin necrosis: No Has patient had a PCN reaction that required hospitalization: No Has patient had a PCN reaction occurring within the last 10 years: No If all of the above answers are "NO", then may proceed with Cephalosporin use.  Marland Kitchen Apricot Flavor Hives  . Doxycycline Hives  . Erythromycin Hives  . Milk Of Magnesia [Magnesium Hydroxide] Hives and Itching  . Penicillins Hives and Other (See Comments)    Has patient had a PCN reaction causing immediate rash, facial/tongue/throat swelling, SOB or lightheadedness with hypotension: No Has patient had a PCN reaction causing severe rash involving mucus membranes or skin necrosis: No Has patient had a PCN reaction that required hospitalization: No Has patient had a PCN reaction occurring within the last 10 years: No If all of the above answers are "NO", then may proceed with Cephalosporin use.  HOME MEDICATIONS: Outpatient Medications Prior to Visit  Medication Sig Dispense Refill  . albuterol (VENTOLIN HFA) 108 (90 Base) MCG/ACT inhaler Inhale 2 puffs into the lungs every 4 (four) hours as needed for wheezing or shortness of breath. 18 g 1  . ergocalciferol (VITAMIN D2) 1.25 MG (50000 UT) capsule Take 1 capsule (50,000 Units total) by mouth once a week. (Patient taking differently: Take  50,000 Units by mouth once a week. Sunday) 8 capsule 0  . ferrous sulfate 325 (65 FE) MG tablet Take 1 tablet (325 mg total) by mouth daily with breakfast. 90 tablet 3  . ipratropium (ATROVENT HFA) 17 MCG/ACT inhaler Inhale 2 puffs into the lungs every 6 (six) hours as needed for wheezing. 1 each 12  . lacosamide (VIMPAT) 50 MG TABS tablet Take 1 tablet (50 mg total) by mouth 2 (two) times daily. 60 tablet 11  . montelukast (SINGULAIR) 10 MG tablet Take 1 tablet (10 mg total) by mouth at bedtime. 30 tablet 3  . potassium chloride SA (KLOR-CON) 20 MEQ tablet Take 20 mEq by mouth 2 (two) times daily.    . rizatriptan (MAXALT-MLT) 10 MG disintegrating tablet Take 1 tablet (10 mg total) by mouth as needed for migraine. May repeat in 2 hours if needed 9 tablet 11  . topiramate (TOPAMAX) 100 MG tablet Take 100 mg by mouth 2 (two) times daily.     . potassium chloride (KLOR-CON M10) 10 MEQ tablet Take 10 mEq by mouth 2 (two) times daily.     No facility-administered medications prior to visit.    PAST MEDICAL HISTORY: Past Medical History:  Diagnosis Date  . Asthma   . Cancer ([place])    ovarian  . Complication of anesthesia    "I wake up during anesthesia" (10/14/2015)  . DVT (deep vein thrombosis) in pregnancy   . Epilepsy ([place])    hx of seizures since childhood, most recent 01/07/20  . GERD (gastroesophageal reflux disease)   . Heart murmur    last work up age 45- no symtoms  . Pancreatitis   . Seizures ([place])   . Sickle cell trait ([place])   . Sleep apnea    "used to wear CPAP; don't have one here in [place] since I moved in 2014" (10/14/2015)    PAST SURGICAL HISTORY: Past Surgical History:  Procedure Laterality Date  . APPENDECTOMY    . CESAREAN SECTION  2013  . CESAREAN SECTION N/A 11/08/2014   Procedure: CESAREAN SECTION;  Surgeon: Truett Mainland, DO;  Location: [facility];  Service: Obstetrics;  Laterality: N/A;  . CHOLECYSTECTOMY N/A 10/16/2015   Procedure: LAPAROSCOPIC CHOLECYSTECTOMY  WITH POSSIBLE INTRAOPERATIVE CHOLANGIOGRAM;  Surgeon: Donnie Mesa, MD;  Location: [facility];  Service: General;  Laterality: N/A;  . CLOSED REDUCTION MANDIBLE WITH MANDIBULOMA N/A 07/17/2017   Procedure: REDUCTION TMJ WITH INTRAMAXILLARY FIXATION;  Surgeon: Diona Browner, DDS;  Location: [facility];  Service: Oral Surgery;  Laterality: N/A;  . ESOPHAGOGASTRODUODENOSCOPY (EGD) WITH PROPOFOL N/A 11/15/2016   Procedure: ESOPHAGOGASTRODUODENOSCOPY (EGD) WITH PROPOFOL;  Surgeon: Clarene Essex, MD;  Location: [facility];  Service: Endoscopy;  Laterality: N/A;  . INGUINAL HERNIA REPAIR Bilateral ~ 1996  . NERVE, TENDON AND ARTERY REPAIR Right 09/23/2012   Procedure: I&D and Repair As Necessary/Right Hand and Palm;  Surgeon: Roseanne Kaufman, MD;  Location: [facility];  Service: Orthopedics;  Laterality: Right;  . OMENTECTOMY N/A 12/25/2018   Procedure: OMENTECTOMY;  Surgeon: Lafonda Mosses, MD;  Location: [facility];  Service: Gynecology;  Laterality: N/A;  . SALPINGOOPHORECTOMY N/A 12/25/2018   Procedure: OPEN UNILATERAL  SALPINGO OOPHORECTOMY WITH STAGING;  Surgeon: Lafonda Mosses, MD;  Location: [facility];  Service: Gynecology;  Laterality: N/A;  . TONSILLECTOMY      FAMILY HISTORY: Family History  Problem Relation Age of Onset  . Cancer Mother        cervical cancer  . Asthma Mother   . Hypertension Father   . Sickle cell anemia Father   . Asthma Sister   . Diabetes Maternal Aunt   . Lymphoma Maternal Grandmother     SOCIAL HISTORY: Social History   Socioeconomic History  . Marital status: Married    Spouse name: Not on file  . Number of children: 3  . Years of education: Not on file  . Highest education level: High school graduate  Occupational History  . Not on file  Tobacco Use  . Smoking status: Never Smoker  . Smokeless tobacco: Never Used  Vaping Use  . Vaping Use: Never used  Substance and Sexual Activity  . Alcohol use: Not Currently  . Drug use: Not Currently    Comment: pos  urine drug screens  . Sexual activity: Yes    Birth control/protection: None  Other Topics Concern  . Not on file  Social History Narrative   ** Merged History Encounter **       Lives with family   Social Determinants of Health   Financial Resource Strain: Not on file  Food Insecurity: Not on file  Transportation Needs: Not on file  Physical Activity: Not on file  Stress: Not on file  Social Connections: Not on file  Intimate Partner Violence: Not on file     PHYSICAL EXAM  GENERAL EXAM/CONSTITUTIONAL: Vitals:  Vitals:   01/11/20 1308  BP: 107/70  Pulse: 80  Weight: 136 lb 3.2 oz (61.8 kg)  Height: 5\' 5"  (1.651 m)   Body mass index is 22.66 kg/m. Wt Readings from Last 3 Encounters:  01/11/20 136 lb 3.2 oz (61.8 kg)  12/15/19 149 lb (67.6 kg)  12/02/19 149 lb (67.6 kg)    Patient is in no distress; well developed, nourished and groomed; neck is supple  CARDIOVASCULAR:  Examination of carotid arteries is normal; no carotid bruits  Regular rate and rhythm, no murmurs  Examination of peripheral vascular system by observation and palpation is normal  EYES:  Ophthalmoscopic exam of optic discs and posterior segments is normal; no papilledema or hemorrhages No exam data present  MUSCULOSKELETAL:  Gait, strength, tone, movements noted in Neurologic exam below  NEUROLOGIC: MENTAL STATUS:  No flowsheet data found.  awake, alert, oriented to person, place and time  recent and remote memory intact  normal attention and concentration  language fluent, comprehension intact, naming intact  fund of knowledge appropriate  CRANIAL NERVE:   2nd - no papilledema on fundoscopic exam  2nd, 3rd, 4th, 6th - pupils equal and reactive to light, visual fields full to confrontation, extraocular muscles intact, no nystagmus  5th - facial sensation symmetric  7th - facial strength symmetric  8th - hearing intact  9th - palate elevates symmetrically, uvula  midline  11th - shoulder shrug symmetric  12th - tongue protrusion midline  MOTOR:   normal bulk and tone, full strength in the BUE, BLE  SENSORY:   normal and symmetric to light touch, temperature, vibration  COORDINATION:   finger-nose-finger, fine finger movements normal  REFLEXES:   deep tendon reflexes present and symmetric  GAIT/STATION:   narrow based gait     DIAGNOSTIC DATA (LABS, IMAGING, TESTING) - I reviewed patient records, labs, notes, testing and imaging myself where available.  Lab Results  Component Value Date   WBC 11.2 (H) 12/15/2019   HGB 9.1 (L) 12/15/2019   HCT 29.3 (L) 12/15/2019   MCV 73.6 (L) 12/15/2019   PLT 325 12/15/2019      Component Value Date/Time   NA 137 12/15/2019 0304   NA 141 08/20/2017 1144   K 2.7 (LL) 12/15/2019 0304   CL 106 12/15/2019 0304   CO2 21 (L) 12/15/2019 0304   GLUCOSE 91 12/15/2019 0304   BUN 6 12/15/2019 0304   BUN 10 08/20/2017 1144   CREATININE 0.70 12/15/2019 0304   CALCIUM 9.1 12/15/2019 0304   PROT 8.3 (H) 11/28/2019 0039   PROT 7.1 08/20/2017 1144   ALBUMIN 4.8 11/28/2019 0039   ALBUMIN 4.4 08/20/2017 1144   AST 25 11/28/2019 0039   ALT 16 11/28/2019 0039   ALKPHOS 51 11/28/2019 0039   BILITOT 0.3 11/28/2019 0039   BILITOT <0.2 08/20/2017 1144   GFRNONAA >60 12/15/2019 0304   GFRAA >60 07/29/2019 1936   Lab Results  Component Value Date   TRIG 213 (H) 04/22/2015   No results found for: HGBA1C Lab Results  Component Value Date   VITAMINB12 325 03/29/2019   Lab Results  Component Value Date   TSH 3.930 08/20/2017    03/28/19 MRI brain 1. Normal MRI appearance of the brain. No acute or focal lesion to explain seizures. 2. Left frontal sinus opacification.  03/28/19 EEG - normal  07/05/19 EEG - normal    ASSESSMENT AND PLAN  29 y.o. year old female here with seizure disorder since age 40 years old with complex partial seizures and generalized convulsive seizures.  Dx:  1.  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 levETIRAcetam (KEPPRA) 750 MG tablet    Sig: Take 2 tablets (1,500 mg total) by mouth 2 (two) times daily.    Dispense:  360 tablet    Refill:  4  . lacosamide 100 MG TABS    Sig: Take 1 tablet (100 mg total) by mouth 2 (two) times daily.    Dispense:  60 tablet    Refill:  5  . topiramate (TOPAMAX) 50 MG tablet    Sig: Take 1 tablet (50 mg total) by mouth 2 (two) times daily.    Dispense:  60 tablet    Refill:  12   Return in about 9 months (around 10/11/2020) for with NP (Amy Lomax).    4yr, MD 01/11/2020, 1:28 PM Certified in Neurology, Neurophysiology and Neuroimaging  [facility] [address], [address] [place], [facility] [address] [phone]

## 2020-01-16 DIAGNOSIS — J45909 Unspecified asthma, uncomplicated: Secondary | ICD-10-CM | POA: Diagnosis not present

## 2020-01-16 DIAGNOSIS — G4739 Other sleep apnea: Secondary | ICD-10-CM | POA: Diagnosis not present

## 2020-01-16 DIAGNOSIS — J45901 Unspecified asthma with (acute) exacerbation: Secondary | ICD-10-CM | POA: Diagnosis not present

## 2020-01-16 DIAGNOSIS — R2689 Other abnormalities of gait and mobility: Secondary | ICD-10-CM | POA: Diagnosis not present

## 2020-01-24 ENCOUNTER — Other Ambulatory Visit: Payer: Self-pay

## 2020-01-24 ENCOUNTER — Emergency Department (HOSPITAL_COMMUNITY)
Admission: EM | Admit: 2020-01-24 | Discharge: 2020-01-24 | Disposition: A | Discharge status: Home / Self Care | Payer: Medicaid Other | Attending: Emergency Medicine | Admitting: Emergency Medicine

## 2020-01-24 DIAGNOSIS — Z8543 Personal history of malignant neoplasm of ovary: Secondary | ICD-10-CM | POA: Insufficient documentation

## 2020-01-24 DIAGNOSIS — E876 Hypokalemia: Secondary | ICD-10-CM | POA: Diagnosis not present

## 2020-01-24 DIAGNOSIS — G40909 Epilepsy, unspecified, not intractable, without status epilepticus: Secondary | ICD-10-CM | POA: Diagnosis not present

## 2020-01-24 DIAGNOSIS — R569 Unspecified convulsions: Secondary | ICD-10-CM | POA: Diagnosis present

## 2020-01-24 DIAGNOSIS — J45909 Unspecified asthma, uncomplicated: Secondary | ICD-10-CM | POA: Diagnosis not present

## 2020-01-24 DIAGNOSIS — R9431 Abnormal electrocardiogram [ECG] [EKG]: Secondary | ICD-10-CM | POA: Diagnosis not present

## 2020-01-24 LAB — I-STAT CHEM 8, ED
BUN: <3 mg/dL — ABNORMAL LOW (ref 6–20)
Calcium, Ion: 1.2 mmol/L (ref 1.15–1.40)
Chloride: 106 mmol/L (ref 98–111)
Creatinine, Ser: 0.9 mg/dL (ref 0.44–1.00)
Glucose, Bld: 108 mg/dL — ABNORMAL HIGH (ref 70–99)
HCT: 38 % (ref 36.0–46.0)
Hemoglobin: 12.9 g/dL (ref 12.0–15.0)
Potassium: 2.2 mmol/L — CL (ref 3.5–5.1)
Sodium: 139 mmol/L (ref 135–145)
TCO2: 19 mmol/L — ABNORMAL LOW (ref 22–32)

## 2020-01-24 LAB — CBC WITH DIFFERENTIAL/PLATELET
Abs Immature Granulocytes: 0.06 10*3/uL (ref 0.00–0.07)
Basophils Absolute: 0.1 10*3/uL (ref 0.0–0.1)
Basophils Relative: 1 %
Eosinophils Absolute: 0.1 10*3/uL (ref 0.0–0.5)
Eosinophils Relative: 1 %
HCT: 28.8 % — ABNORMAL LOW (ref 36.0–46.0)
Hemoglobin: 9 g/dL — ABNORMAL LOW (ref 12.0–15.0)
Immature Granulocytes: 0 %
Lymphocytes Relative: 25 %
Lymphs Abs: 3.6 10*3/uL (ref 0.7–4.0)
MCH: 22.2 pg — ABNORMAL LOW (ref 26.0–34.0)
MCHC: 31.3 g/dL (ref 30.0–36.0)
MCV: 71.1 fL — ABNORMAL LOW (ref 80.0–100.0)
Monocytes Absolute: 0.9 10*3/uL (ref 0.1–1.0)
Monocytes Relative: 6 %
Neutro Abs: 9.7 10*3/uL — ABNORMAL HIGH (ref 1.7–7.7)
Neutrophils Relative %: 67 %
Platelets: 340 10*3/uL (ref 150–400)
RBC: 4.05 MIL/uL (ref 3.87–5.11)
RDW: 17.7 % — ABNORMAL HIGH (ref 11.5–15.5)
WBC: 14.4 10*3/uL — ABNORMAL HIGH (ref 4.0–10.5)
nRBC: 0 % (ref 0.0–0.2)

## 2020-01-24 LAB — BASIC METABOLIC PANEL
Anion gap: 9 (ref 5–15)
BUN: <5 mg/dL — ABNORMAL LOW (ref 6–20)
CO2: 20 mmol/L — ABNORMAL LOW (ref 22–32)
Calcium: 8.2 mg/dL — ABNORMAL LOW (ref 8.9–10.3)
Chloride: 107 mmol/L (ref 98–111)
Creatinine, Ser: 0.75 mg/dL (ref 0.44–1.00)
GFR, Estimated: >60 mL/min (ref >60)
Glucose, Bld: 92 mg/dL (ref 70–99)
Potassium: 2.8 mmol/L — ABNORMAL LOW (ref 3.5–5.1)
Sodium: 136 mmol/L (ref 135–145)

## 2020-01-24 LAB — HCG, QUANTITATIVE, PREGNANCY: hCG, Beta Chain, Quant, S: 1 m[IU]/mL (ref <5)

## 2020-01-24 LAB — CBG MONITORING, ED: Glucose-Capillary: 116 mg/dL — ABNORMAL HIGH (ref 70–99)

## 2020-01-24 LAB — MAGNESIUM: Magnesium: 2.5 mg/dL — ABNORMAL HIGH (ref 1.7–2.4)

## 2020-01-24 MED ORDER — LORAZEPAM 2 MG/ML IJ SOLN
INTRAMUSCULAR | Status: AC
  Filled 2020-01-24: qty 1

## 2020-01-24 MED ORDER — POTASSIUM CHLORIDE CRYS ER 20 MEQ PO TBCR
80.0000 meq | EXTENDED_RELEASE_TABLET | Freq: Once | ORAL | Status: AC
  Administered 2020-01-24: 80 meq via ORAL
  Filled 2020-01-24: qty 4

## 2020-01-24 MED ORDER — IBUPROFEN 200 MG PO TABS
400.0000 mg | ORAL_TABLET | Freq: Once | ORAL | Status: AC
  Administered 2020-01-24: 400 mg via ORAL
  Filled 2020-01-24: qty 2

## 2020-01-24 MED ORDER — MAGNESIUM SULFATE 2 GM/50ML IV SOLN
2.0000 g | Freq: Once | INTRAVENOUS | Status: AC
  Administered 2020-01-24: 2 g via INTRAVENOUS
  Filled 2020-01-24: qty 50

## 2020-01-24 MED ORDER — ONDANSETRON HCL 4 MG/2ML IJ SOLN
4.0000 mg | Freq: Once | INTRAMUSCULAR | Status: AC
  Administered 2020-01-24: 4 mg via INTRAVENOUS
  Filled 2020-01-24: qty 2

## 2020-01-24 MED ORDER — POTASSIUM CHLORIDE 10 MEQ/100ML IV SOLN
10.0000 meq | INTRAVENOUS | Status: AC
  Administered 2020-01-24 (×2): 10 meq via INTRAVENOUS
  Filled 2020-01-24 (×2): qty 100

## 2020-01-24 MED ORDER — LEVETIRACETAM IN NACL 1500 MG/100ML IV SOLN
1500.0000 mg | Freq: Once | INTRAVENOUS | Status: AC
  Administered 2020-01-24: 1500 mg via INTRAVENOUS
  Filled 2020-01-24: qty 100

## 2020-01-24 NOTE — ED Provider Notes (Signed)
[facility] DEPT Provider Note   CSN: [id number] Arrival date & time: 01/24/20  0255     History Chief Complaint  Patient presents with   Seizures    Jasmine [name] is a 30 y.o. female.  Compliant with meds. Breakthrough seizure PTA, again here. Returned to baseline eventually.    Seizures Seizure activity on arrival: yes   Seizure type:  Grand mal Preceding symptoms: headache and vision change   Preceding symptoms: no dizziness   Initial focality:  None Episode characteristics: confusion and disorientation   Episode characteristics: no combativeness   Postictal symptoms: confusion   Return to baseline: yes   Severity:  Mild Timing:  Once Progression:  Resolved      Past Medical History:  Diagnosis Date   Asthma    Cancer ([place])    ovarian   Complication of anesthesia    "I wake up during anesthesia" (10/14/2015)   DVT (deep vein thrombosis) in pregnancy    Epilepsy ([place])    hx of seizures since childhood, most recent 01/07/20   GERD (gastroesophageal reflux disease)    Heart murmur    last work up age 30- no symtoms   Pancreatitis    Seizures ([place])    Sickle cell trait ([place])    Sleep apnea    "used to wear CPAP; don't have one here in [place] since I moved in 2014" (10/14/2015)    Patient Active Problem List   Diagnosis Date Noted   Status epilepticus ([place]) 03/28/2019   Moderate persistent asthma 03/19/2019   Ovarian tumor of borderline malignancy, 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   Pelvic mass in female 12/25/2018   Abnormal uterine bleeding    Pelvic mass 12/22/2018   Dehydration 12/22/2018   Asthma exacerbation 12/01/2017   Acute hypercapnic respiratory failure ([place]) 12/01/2017   Exocrine pancreatic insufficiency 12/01/2017   Facial contusion 07/17/2017   Closed dislocation of right jaw 07/17/2017   Nausea with hives 07/09/2017   History of DVT (deep vein thrombosis) in  pregnancy ([place]) 05/29/2017   Sickle cell trait ([place]) 05/29/2017   Acute asthma exacerbation 05/29/2017   Chronic pancreatitis ([place]) 05/29/2017   Seizures ([place]) 05/29/2017   Tobacco abuse 05/29/2017   Acute on chronic respiratory failure with hypoxemia ([place]) 05/29/2017   Pneumonia 05/04/2017   SOB (shortness of breath) 04/08/2017   Cough 04/07/2017   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   Acute on chronic respiratory failure with hypoxia ([place]) 03/10/2017   Colitis 11/12/2016   Seizure ([place]) 11/12/2016   Calculus of bile duct with acute cholecystitis with obstruction 10/14/2015   Adjustment disorder with mixed anxiety and depressed mood 04/26/2015   Low blood potassium 04/19/2015   Asthma with acute exacerbation 04/19/2015   Asthma 01/03/2015   Asthma, chronic, unspecified asthma severity, with acute exacerbation 01/03/2015   Influenza-like illness 01/03/2015   Status post cesarean section 11/08/2014   Previous cesarean section complicating pregnancy, antepartum condition or complication    GERD (gastroesophageal reflux disease)    Gastroesophageal reflux disease without esophagitis    Abnormal biochemical finding on antenatal screening of mother    Abnormal quad screen    Echogenic focus of heart of fetus affecting antepartum care of mother    Drug use affecting pregnancy, antepartum 05/19/2014   Seizure disorder during pregnancy, antepartum ([place]) 05/13/2014   Supervision of high risk pregnancy, antepartum [id number]   Asthma complicating pregnancy, antepartum 05/13/2014   History of food anaphylaxis 05/13/2014   Hypokalemia 04/08/2014  Seizure disorder, sept 2015 last seizure 01/19/2013   Microcytic anemia 01/19/2013   Leukocytosis 01/19/2013    Past Surgical History:  Procedure Laterality Date   APPENDECTOMY     CESAREAN SECTION  2013   CESAREAN SECTION N/A 11/08/2014   Procedure: CESAREAN SECTION;  Surgeon: Truett Mainland, DO;   Location: [facility];  Service: Obstetrics;  Laterality: N/A;   CHOLECYSTECTOMY N/A 10/16/2015   Procedure: LAPAROSCOPIC CHOLECYSTECTOMY WITH POSSIBLE INTRAOPERATIVE CHOLANGIOGRAM;  Surgeon: Donnie Mesa, MD;  Location: [facility];  Service: General;  Laterality: N/A;   CLOSED REDUCTION MANDIBLE WITH MANDIBULOMA N/A 07/17/2017   Procedure: REDUCTION TMJ WITH INTRAMAXILLARY FIXATION;  Surgeon: Diona Browner, DDS;  Location: [facility];  Service: Oral Surgery;  Laterality: N/A;   ESOPHAGOGASTRODUODENOSCOPY (EGD) WITH PROPOFOL N/A 11/15/2016   Procedure: ESOPHAGOGASTRODUODENOSCOPY (EGD) WITH PROPOFOL;  Surgeon: Clarene Essex, MD;  Location: [facility];  Service: Endoscopy;  Laterality: N/A;   INGUINAL HERNIA REPAIR Bilateral ~ 1996   NERVE, TENDON AND ARTERY REPAIR Right 09/23/2012   Procedure: I&D and Repair As Necessary/Right Hand and Palm;  Surgeon: Roseanne Kaufman, MD;  Location: [facility];  Service: Orthopedics;  Laterality: Right;   OMENTECTOMY N/A 12/25/2018   Procedure: OMENTECTOMY;  Surgeon: Lafonda Mosses, MD;  Location: [facility];  Service: Gynecology;  Laterality: N/A;   SALPINGOOPHORECTOMY N/A 12/25/2018   Procedure: OPEN UNILATERAL  SALPINGO OOPHORECTOMY WITH STAGING;  Surgeon: Lafonda Mosses, MD;  Location: [facility];  Service: Gynecology;  Laterality: N/A;   TONSILLECTOMY       OB History    Gravida  3   Para  3   Term  3   Preterm  0   AB  0   Living  3     SAB  0   IAB  0   Ectopic  0   Multiple      Live Births  3           Family History  Problem Relation Age of Onset   Cancer Mother        cervical cancer   Asthma Mother    Hypertension Father    Sickle cell anemia Father    Asthma Sister    Diabetes Maternal Aunt    Lymphoma Maternal Grandmother     Social History   Tobacco Use   Smoking status: Never Smoker   Smokeless tobacco: Never Used  Vaping Use   Vaping Use: Never used  Substance Use Topics   Alcohol use: Not Currently    Drug use: Not Currently    Comment: pos urine drug screens    Home Medications Prior to Admission medications   Medication Sig Start Date End Date Taking? Authorizing Provider  albuterol (VENTOLIN HFA) 108 (90 Base) MCG/ACT inhaler Inhale 2 puffs into the lungs every 4 (four) hours as needed for wheezing or shortness of breath. 01/06/20   Kerin Perna, NP  ergocalciferol (VITAMIN D2) 1.25 MG (50000 UT) capsule Take 1 capsule (50,000 Units total) by mouth once a week. Patient taking differently: Take 50,000 Units by mouth once a week. Sunday 12/04/19   Kerin Perna, NP  ferrous sulfate 325 (65 FE) MG tablet Take 1 tablet (325 mg total) by mouth daily with breakfast. 12/02/19   Kerin Perna, NP  ipratropium (ATROVENT HFA) 17 MCG/ACT inhaler Inhale 2 puffs into the lungs every 6 (six) hours as needed for wheezing. 12/02/19   Kerin Perna, NP  lacosamide 100 MG TABS Take  1 tablet (100 mg total) by mouth 2 (two) times daily. 01/11/20   Penumalli, Earlean Polka, MD  levETIRAcetam (KEPPRA) 750 MG tablet Take 2 tablets (1,500 mg total) by mouth 2 (two) times daily. 01/11/20   Penumalli, Earlean Polka, MD  montelukast (SINGULAIR) 10 MG tablet Take 1 tablet (10 mg total) by mouth at bedtime. 12/02/19   Kerin Perna, NP  potassium chloride SA (KLOR-CON) 20 MEQ tablet Take 20 mEq by mouth 2 (two) times daily.    [provider]  rizatriptan (MAXALT-MLT) 10 MG disintegrating tablet Take 1 tablet (10 mg total) by mouth as needed for migraine. May repeat in 2 hours if needed 08/04/19   Penumalli, Earlean Polka, MD  topiramate (TOPAMAX) 50 MG tablet Take 1 tablet (50 mg total) by mouth 2 (two) times daily. 01/11/20   Penumalli, Earlean Polka, MD  cetirizine (ZYRTEC) 10 MG tablet Take 1 tablet (10 mg total) by mouth daily. 12/11/17 08/04/19  Tasia Catchings, Amy V, PA-C    Allergies    Cheese, Chocolate, Ibuprofen, Ivp dye [iodinated diagnostic agents], Latex, Orange juice [orange oil], Other, Peach  [prunus persica], Peanuts [peanut oil], Pear, Prednisone, Raspberry, Tylenol [acetaminophen], Amoxicillin, Apricot flavor, Doxycycline, Erythromycin, Milk of magnesia [magnesium hydroxide], and Penicillins  Review of Systems   Review of Systems  Neurological: Positive for seizures.  All other systems reviewed and are negative.   Physical Exam Updated Vital Signs BP 102/63    Pulse 77    Temp 98 F (36.7 C) (Oral)    Resp 16    Ht 5\' 5"  (1.651 m)    Wt 61.7 kg    LMP  (LMP Unknown)    SpO2 100%    BMI 22.63 kg/m   Physical Exam Vitals and nursing note reviewed.  Constitutional:      Appearance: She is well-developed and well-nourished.  HENT:     Head: Normocephalic and atraumatic.     Mouth/Throat:     Mouth: Mucous membranes are moist.  Eyes:     Pupils: Pupils are equal, round, and reactive to light.  Cardiovascular:     Rate and Rhythm: Normal rate and regular rhythm.  Pulmonary:     Effort: No respiratory distress.     Breath sounds: No stridor.  Abdominal:     General: Abdomen is flat. There is no distension.  Musculoskeletal:        General: No swelling or tenderness. Normal range of motion.     Cervical back: Normal range of motion.  Skin:    General: Skin is warm and dry.  Neurological:     General: No focal deficit present.     Mental Status: She is alert.     ED Results / Procedures / Treatments   Labs (all labs ordered are listed, but only abnormal results are displayed) Labs Reviewed  I-STAT CHEM 8, ED - Abnormal; Notable for the following components:      Result Value   Potassium 2.2 (*)    BUN <3 (*)    Glucose, Bld 108 (*)    TCO2 19 (*)    All other components within normal limits  CBG MONITORING, ED - Abnormal; Notable for the following components:   Glucose-Capillary 116 (*)    All other components within normal limits  HCG, QUANTITATIVE, PREGNANCY  LEVETIRACETAM LEVEL  CBC WITH DIFFERENTIAL/PLATELET  BASIC METABOLIC PANEL  MAGNESIUM     EKG EKG Interpretation  Date/Time:  Sunday January 24 2020 03:24:09 EST Ventricular Rate:  94 PR Interval:    QRS Duration: 94 QT Interval:  472 QTC Calculation: 591 R Axis:   51 Text Interpretation: Sinus rhythm Borderline ST depression, diffuse leads Prolonged QT interval 12 Lead; Mason-Likar depression new from most recent Confirmed by Merrily Pew [phone]) on 01/24/2020 4:00:58 AM   Radiology No results found.  Procedures .Critical Care Performed by: Merrily Pew, MD Authorized by: Merrily Pew, MD   Critical care provider statement:    Critical care time (minutes):  45   Critical care was necessary to treat or prevent imminent or life-threatening deterioration of the following conditions:  Metabolic crisis and CNS failure or compromise (severe electrolyte abnormalities)   Critical care was time spent personally by me on the following activities:  Discussions with consultants, evaluation of patient's response to treatment, examination of patient, ordering and performing treatments and interventions, ordering and review of laboratory studies, ordering and review of radiographic studies, pulse oximetry, re-evaluation of patient's condition, obtaining history from patient or surrogate and review of old charts   (including critical care time)  Medications Ordered in ED Medications  potassium chloride 10 mEq in 100 mL IVPB (10 mEq Intravenous New Bag/Given 01/24/20 0618)  levETIRAcetam (KEPPRA) IVPB 1500 mg/ 100 mL premix (1,500 mg Intravenous New Bag/Given 01/24/20 0350)  potassium chloride SA (KLOR-CON) CR tablet 80 mEq (80 mEq Oral Given 01/24/20 0618)  magnesium sulfate IVPB 2 g 50 mL (2 g Intravenous New Bag/Given 01/24/20 0421)  ibuprofen (ADVIL) tablet 400 mg (400 mg Oral Given 01/24/20 0618)  ondansetron (ZOFRAN) injection 4 mg (4 mg Intravenous Given 01/24/20 0515)    ED Course  I have reviewed the triage vital signs and the nursing notes.  Pertinent labs & imaging results  that were available during my care of the patient were reviewed by me and considered in my medical decision making (see chart for details).    MDM Rules/Calculators/A&P                          Hypokalemia. Breakthrough seizure. Considered admission for repletion however there is significant limitations in hospital so will attempt to replete in ED. Mg given to help with K repletion, Mg ordered.   Care transferred pending recheck. Anticipate discharge if K is improved and patient is continually at baseline.   Final Clinical Impression(s) / ED Diagnoses Final diagnoses:  None    Rx / DC Orders ED Discharge Orders    None       Daylin Eads, Corene Cornea, MD 01/26/20 1610

## 2020-01-24 NOTE — ED Notes (Signed)
After assessing vital signs, patient was witnessed having a seizure lasting approximately 30 seconds. She was assisted to sit up onto a recliner in triage and seizure pads were rigged onto the recliner for the patient's safety.

## 2020-01-24 NOTE — ED Provider Notes (Signed)
30 year old female sent by Dr. Dayna Barker after having a seizure.  Patient was found to be severely hypokalemic potassium of 2.2.  Patient was given oral as well as IV potassium.  Repeat potassium is now 2.8.  She is currently on oral potassium supplementations and states compliance with this.  We will have her continue this and follow-up with her doctor   Lacretia Leigh, MD 01/24/20 1047

## 2020-01-25 ENCOUNTER — Telehealth: Payer: Self-pay

## 2020-01-25 NOTE — Telephone Encounter (Signed)
Transition Care Management Follow-up Telephone Call  Date of discharge and from where: 01/24/2020 Jasmine [name].  How have you been since you were released from the hospital? Feeling a bit better.  Any questions or concerns? No  Items Reviewed:  Did the pt receive and understand the discharge instructions provided? Yes   Medications obtained and verified? No medications given.   Other? No   Any new allergies since your discharge? No   Dietary orders reviewed? Yes  Do you have support at home? Yes    Functional Questionnaire: (I = Independent and D = Dependent) ADLs: I  Bathing/Dressing- I  Meal Prep- I  Eating- I  Maintaining continence- I  Transferring/Ambulation- I  Managing Meds- I  Follow up appointments reviewed:   PCP Hospital f/u appt confirmed? No    Specialist Hospital f/u appt confirmed? Yes  Scheduled to see Debbora Presto, NP at [facility] on 02/09/2020 @ 2pm.  Are transportation arrangements needed? No   If their condition worsens, is the pt aware to call PCP or go to the Emergency Dept.? Yes  Was the patient provided with contact information for the PCP's office or ED? Yes  Was to pt encouraged to call back with questions or concerns? Yes

## 2020-01-27 ENCOUNTER — Encounter: Payer: Self-pay | Admitting: Family Medicine

## 2020-01-27 ENCOUNTER — Encounter (INDEPENDENT_AMBULATORY_CARE_PROVIDER_SITE_OTHER): Payer: Self-pay | Admitting: Primary Care

## 2020-01-29 ENCOUNTER — Other Ambulatory Visit (INDEPENDENT_AMBULATORY_CARE_PROVIDER_SITE_OTHER): Payer: Self-pay | Admitting: Primary Care

## 2020-01-29 LAB — LEVETIRACETAM LEVEL: Levetiracetam Lvl: <1 ug/mL — ABNORMAL LOW (ref 10.0–40.0)

## 2020-01-29 NOTE — Telephone Encounter (Signed)
Requested medications are due for refill today yes  Requested medications are on the active medication list yes  Last refill 11/19  Last visit 11/2019  Future visit scheduled no  Notes to clinic Not Delegated

## 2020-02-01 ENCOUNTER — Other Ambulatory Visit (INDEPENDENT_AMBULATORY_CARE_PROVIDER_SITE_OTHER): Payer: Self-pay | Admitting: Primary Care

## 2020-02-01 MED ORDER — VITAMIN D3 50 MCG (2000 UT) PO CAPS
2000.0000 [IU] | ORAL_CAPSULE | Freq: Every day | ORAL | 1 refills | Status: AC
Start: 2020-02-01 — End: ?

## 2020-02-04 ENCOUNTER — Other Ambulatory Visit: Payer: Self-pay | Admitting: *Deleted

## 2020-02-04 NOTE — Patient Outreach (Signed)
Care Coordination  02/04/2020  [name] [birth year] [id number]    Medicaid Managed Care   Unsuccessful Outreach Note  02/04/2020 Name: Jasmine [name] MRN: [id number] DOB: 08-Aug-1990  Referred by: Kerin Perna, NP Reason for referral : High Risk Managed Medicaid (Unsuccessful initial outreach)   An unsuccessful telephone outreach was attempted today. The patient was referred to the case management team for assistance with care management and care coordination.   Follow Up Plan: The care management team will reach out to the patient again over the next 7-14 days.   Lurena Joiner RN, BSN [place]  [employer] manager

## 2020-02-04 NOTE — Patient Instructions (Signed)
Visit Information  Ms. Jasmine [name]  - as a part of your Medicaid benefit, you are eligible for care management and care coordination services at no cost or copay. I was unable to reach you by phone today but would be happy to help you with your health related needs. Please feel free to call me @ [phone].   A member of the Managed Medicaid care management team will reach out to you again over the next 7-14 days.   Lurena Joiner RN, BSN [place]  [employer] manager

## 2020-02-09 ENCOUNTER — Ambulatory Visit: Payer: Medicaid Other | Admitting: Family Medicine

## 2020-02-16 ENCOUNTER — Ambulatory Visit (INDEPENDENT_AMBULATORY_CARE_PROVIDER_SITE_OTHER): Payer: Medicaid Other | Admitting: Primary Care

## 2020-02-16 ENCOUNTER — Telehealth: Payer: Self-pay | Admitting: Hematology

## 2020-02-16 ENCOUNTER — Other Ambulatory Visit: Payer: Self-pay

## 2020-02-16 ENCOUNTER — Encounter (INDEPENDENT_AMBULATORY_CARE_PROVIDER_SITE_OTHER): Payer: Self-pay | Admitting: Primary Care

## 2020-02-16 VITALS — BP 113/78 | HR 106 | Temp 97.5°F | Ht 65.0 in | Wt 133.4 lb

## 2020-02-16 DIAGNOSIS — Z09 Encounter for follow-up examination after completed treatment for conditions other than malignant neoplasm: Secondary | ICD-10-CM

## 2020-02-16 DIAGNOSIS — G4739 Other sleep apnea: Secondary | ICD-10-CM | POA: Diagnosis not present

## 2020-02-16 DIAGNOSIS — F4323 Adjustment disorder with mixed anxiety and depressed mood: Secondary | ICD-10-CM

## 2020-02-16 DIAGNOSIS — R799 Abnormal finding of blood chemistry, unspecified: Secondary | ICD-10-CM

## 2020-02-16 DIAGNOSIS — J45901 Unspecified asthma with (acute) exacerbation: Secondary | ICD-10-CM | POA: Diagnosis not present

## 2020-02-16 DIAGNOSIS — R2689 Other abnormalities of gait and mobility: Secondary | ICD-10-CM | POA: Diagnosis not present

## 2020-02-16 DIAGNOSIS — J45909 Unspecified asthma, uncomplicated: Secondary | ICD-10-CM | POA: Diagnosis not present

## 2020-02-16 NOTE — Progress Notes (Signed)
HPI [name] 29 y.o.female presents for follow up from the emergency room for Breakthrough seizure on 01/24/20, followed by neurology and recently adjusted medication. Voices concerns on low low H/H, elevated WBC and Neutro Abs elevated.   Past Medical History:  Diagnosis Date  . Asthma   . Cancer ([place])    ovarian  . Complication of anesthesia    "I wake up during anesthesia" (10/14/2015)  . DVT (deep vein thrombosis) in pregnancy   . Epilepsy ([place])    hx of seizures since childhood, most recent 01/07/20  . GERD (gastroesophageal reflux disease)   . Heart murmur    last work up age 57- no symtoms  . Pancreatitis   . Seizures ([place])   . Sickle cell trait ([place])   . Sleep apnea    "used to wear CPAP; don't have one here in [place] since I moved in 2014" (10/14/2015)     Allergies  Allergen Reactions  . Cheese Shortness Of Breath  . Chocolate Shortness Of Breath  . Ibuprofen Hives and Shortness Of Breath    Children's ibuprofen  . Ivp Dye [Iodinated Diagnostic Agents] Hives, Shortness Of Breath and Itching  . Latex Anaphylaxis, Swelling and Other (See Comments)    Reaction:  Localized swelling   . Orange Juice [Orange Oil] Shortness Of Breath  . Other Hives and Other (See Comments)    Pt states that she is allergic to all steroids except IV solu-medrol.    Marland Kitchen Peach [Prunus Persica] Anaphylaxis  . Peanuts [Peanut Oil] Anaphylaxis  . Pear Anaphylaxis  . Prednisone Hives  . Raspberry Anaphylaxis  . Tylenol [Acetaminophen] Hives, Shortness Of Breath and Other (See Comments)    Pt states that this is only with the liquid form  . Amoxicillin Hives and Other (See Comments)    Has patient had a PCN reaction causing immediate rash, facial/tongue/throat swelling, SOB or lightheadedness with hypotension: No Has patient had a PCN reaction causing severe rash involving mucus membranes or skin necrosis: No Has patient had a PCN reaction that required hospitalization: No Has patient  had a PCN reaction occurring within the last 10 years: No If all of the above answers are "NO", then may proceed with Cephalosporin use.  Marland Kitchen Apricot Flavor Hives  . Doxycycline Hives  . Erythromycin Hives  . Milk Of Magnesia [Magnesium Hydroxide] Hives and Itching  . Penicillins Hives and Other (See Comments)    Has patient had a PCN reaction causing immediate rash, facial/tongue/throat swelling, SOB or lightheadedness with hypotension: No Has patient had a PCN reaction causing severe rash involving mucus membranes or skin necrosis: No Has patient had a PCN reaction that required hospitalization: No Has patient had a PCN reaction occurring within the last 10 years: No If all of the above answers are "NO", then may proceed with Cephalosporin use.      Current Outpatient Medications on File Prior to Visit  Medication Sig Dispense Refill  . albuterol (VENTOLIN HFA) 108 (90 Base) MCG/ACT inhaler Inhale 2 puffs into the lungs every 4 (four) hours as needed for wheezing or shortness of breath. 18 g 1  . Cholecalciferol (VITAMIN D3) 50 MCG (2000 UT) capsule Take 1 capsule (2,000 Units total) by mouth daily. 90 capsule 1  . ferrous sulfate 325 (65 FE) MG tablet Take 1 tablet (325 mg total) by mouth daily with breakfast. 90 tablet 3  . ipratropium (ATROVENT HFA) 17 MCG/ACT inhaler Inhale 2 puffs into the lungs every 6 (  six) hours as needed for wheezing. 1 each 12  . lacosamide 100 MG TABS Take 1 tablet (100 mg total) by mouth 2 (two) times daily. 60 tablet 5  . levETIRAcetam (KEPPRA) 750 MG tablet Take 2 tablets (1,500 mg total) by mouth 2 (two) times daily. 360 tablet 4  . montelukast (SINGULAIR) 10 MG tablet Take 1 tablet (10 mg total) by mouth at bedtime. 30 tablet 3  . potassium chloride SA (KLOR-CON) 20 MEQ tablet Take 20 mEq by mouth 2 (two) times daily.    . rizatriptan (MAXALT-MLT) 10 MG disintegrating tablet Take 1 tablet (10 mg total) by mouth as needed for migraine. May repeat in 2 hours if  needed 9 tablet 11  . topiramate (TOPAMAX) 50 MG tablet Take 1 tablet (50 mg total) by mouth 2 (two) times daily. 60 tablet 12  . [DISCONTINUED] cetirizine (ZYRTEC) 10 MG tablet Take 1 tablet (10 mg total) by mouth daily. 15 tablet 0   No current facility-administered medications on file prior to visit.    ROS: all negative except above.   Physical Exam: Filed Weights   02/16/20 0952  Weight: 133 lb 6.4 oz (60.5 kg)   BP 113/78 (BP Location: Right Arm, Patient Position: Sitting, Cuff Size: Normal)   Pulse (!) 106   Temp (!) 97.5 F (36.4 C) (Temporal)   Ht 5\' 5"  (1.651 m)   Wt 133 lb 6.4 oz (60.5 kg)   LMP 01/22/2020 (Exact Date)   SpO2 99%   BMI 22.20 kg/m  General Appearance: Well nourished, thin frame in no apparent distress. Eyes: PERRLA, EOMs, conjunctiva no swelling or erythema Sinuses: No Frontal/maxillary tenderness ENT/Mouth: Ext aud canals clear, TMs without erythema, bulging. Hearing normal.  Neck: Supple, thyroid normal.  Respiratory: Respiratory effort normal, BS equal bilaterally without rales, rhonchi, wheezing or stridor.  Cardio: RRR with no MRGs. Brisk peripheral pulses without edema.  Abdomen: Soft, + BS.  Non tender, no guarding, rebound, hernias, masses. Lymphatics: Non tender without lymphadenopathy.  Musculoskeletal: Full ROM, 5/5 strength, normal gait.  Skin: bruise left sore swollen unknown origin  Neuro: Cranial nerves intact. Normal muscle tone, no cerebellar symptoms. Sensation intact.  Psych: Awake and oriented X 3, normal affect, Insight and Judgment appropriate.  Jasmine [name] was seen today for abnormal labs .  Diagnoses and all orders for this visit:  Abnormal blood finding -     Ambulatory referral to Hematology / [facility] discharge follow-up ED f/u with recommend PCP and oncology   Adjustment disorder with mixed anxiety and depressed mood.phq Flowsheet Row Office Visit from 12/02/2019 in [facility]  PHQ-9  Total Score 16    refer to CSW   Reviewed ED notes and neurology   Kerin Perna, NP 10:02 AM

## 2020-02-16 NOTE — Telephone Encounter (Signed)
Received a new hem referral from Juluis Mire, NP for elevated wbc. Ms. Jasmine [name] cld to schedule a new hem appt w/dr. Irene Limbo on 2/9 at 1pm. Pt aware to arrive 20 minutes early.

## 2020-02-23 ENCOUNTER — Institutional Professional Consult (permissible substitution) (INDEPENDENT_AMBULATORY_CARE_PROVIDER_SITE_OTHER): Payer: Medicaid Other | Admitting: Licensed Clinical Social Worker

## 2020-02-23 ENCOUNTER — Encounter (INDEPENDENT_AMBULATORY_CARE_PROVIDER_SITE_OTHER): Payer: Self-pay

## 2020-02-23 NOTE — Progress Notes (Signed)
HEMATOLOGY/ONCOLOGY CONSULTATION NOTE  Date of Service: 02/23/2020  Patient Care Team: Kerin Perna, NP as PCP - General (Internal Medicine) Clent Demark, PA-C (Physician Assistant)  CHIEF COMPLAINTS/PURPOSE OF CONSULTATION:  Elevated WBC  HISTORY OF PRESENTING ILLNESS:   Jasmine [name] is a wonderful 30 y.o. female who has been referred to [place] by Juluis Mire, NP for evaluation and management of elevated WBC. The pt reports that she is doing well overall.  01/24/2020 Levetiracetam levels were undetectable, which is suggestive of not taking medications. The pt notes she is taking her medications currently and was told this was due to her vomiting.  The pt reports that she has never seen a blood doctor before, noting a history of anemia and low WBC.The pt has a history of seizures. This has been ocurring since she ws a child. Her current Nuerologist is Dr. Andrey Spearman. She notes her last fall was yesterday. She notes she had a fall four months ago that has left very big bruises and knots all on her legs. She notes she recently had to line and pad her bedding and the corners in her houses due to injuring herself frequently. She has still been injuring herself despite this. The pt also has a history of asthma, worsening when it is cold. She has an Albuterol inhaler she uses prn. The pt denies any steroid or Prednisone use. The pt also notes that she has a history of Cystic Fibrosis, and frequent respiratory infections. She denies any Pulmonologist.   The pt also deals with fatigue and difficulty sleeping. She used a CPAP machine when she was a kid, but notes she doe not think she needs it anymore and does not use it. She has not done any sleep tests recently, but still experiences trouble sleeping and is very fatigued.  The pt has a history of Pancreatitis when she was in high school, but denies any current issues related to this outside of stomach pain. The pt was  diagnosed with a pelvic mass and Pancreatic Cancer around two years ago and had a surgery on 12/25/2018 for this. She still follows up with Dr. Berline Lopes.   The pt notes that she is from [place] and moved here around when she was 30 years old. The pt notes that she has three children, all girls, and a husband. Her husband assists her in making medical decisions when needed. The pt has a history of sickle cell trait.   The pt notes that she used to smoke mariajuana but stopped around four months ago. She denies any history of cigarette smoking and does not consume alcohol. The pt notes no changes in food intake.   The pt notes she recently has been experiencing night sweats for 1-2 months and is drenched in her groin area and underarm area. She notes these night sweats are drenching.  The pt has a history of anemia and heavy periods. She still currently experiences heavy periods. The pt takes daily Iron Supplements BID  For over three months now at this time. The pt also experiences migraine headaches and takes Maxalt  for this prn every four hours. She is also on potassium replacement and denies any known allergies. The pt notes she was never instructed why her potassium levels were so low. The pt still takes Singular on a daily basis. The pt takes her potassium regularly.  The pt's Vitamin D levels are low, but she takes Vitamin D supplementation. She denies any other Vitamin  supplementation. The pt notes she has been experiencing dark stools recently. The pt notes that she received transfusions in the past as a child due to need for blood. She was unaware of why she needed this, but denies any recent PRBC. The pt notes that she is unaware of any allergies to IV Iron. The pt is allergic to the IVP dye used for CT scans. The pt notes she had her gallbladder removed in 2017.   On review of systems, pt reports chills, night sweats, black stools, migraines, seizures, extreme fatigue, bruising, abdominal  soreness over gallbladder and denies fevers, decreased appetite, bloody stools, unexpected weight loss, and any other symptoms.  MEDICAL HISTORY:  Past Medical History:  Diagnosis Date  . Asthma   . Cancer ([place])    ovarian  . Complication of anesthesia    "I wake up during anesthesia" (10/14/2015)  . DVT (deep vein thrombosis) in pregnancy   . Epilepsy ([place])    hx of seizures since childhood, most recent 01/07/20  . GERD (gastroesophageal reflux disease)   . Heart murmur    last work up age 80- no symtoms  . Pancreatitis   . Seizures ([facility])   . Sickle cell trait ([place])   . Sleep apnea    "used to wear CPAP; don't have one here in [place] since I moved in 2014" (10/14/2015)    SURGICAL HISTORY: Past Surgical History:  Procedure Laterality Date  . APPENDECTOMY    . CESAREAN SECTION  2013  . CESAREAN SECTION N/A 11/08/2014   Procedure: CESAREAN SECTION;  Surgeon: Truett Mainland, DO;  Location: [facility];  Service: Obstetrics;  Laterality: N/A;  . CHOLECYSTECTOMY N/A 10/16/2015   Procedure: LAPAROSCOPIC CHOLECYSTECTOMY WITH POSSIBLE INTRAOPERATIVE CHOLANGIOGRAM;  Surgeon: Donnie Mesa, MD;  Location: [facility];  Service: General;  Laterality: N/A;  . CLOSED REDUCTION MANDIBLE WITH MANDIBULOMA N/A 07/17/2017   Procedure: REDUCTION TMJ WITH INTRAMAXILLARY FIXATION;  Surgeon: Diona Browner, DDS;  Location: [facility];  Service: Oral Surgery;  Laterality: N/A;  . ESOPHAGOGASTRODUODENOSCOPY (EGD) WITH PROPOFOL N/A 11/15/2016   Procedure: ESOPHAGOGASTRODUODENOSCOPY (EGD) WITH PROPOFOL;  Surgeon: Clarene Essex, MD;  Location: [facility];  Service: Endoscopy;  Laterality: N/A;  . INGUINAL HERNIA REPAIR Bilateral ~ 1996  . NERVE, TENDON AND ARTERY REPAIR Right 09/23/2012   Procedure: I&D and Repair As Necessary/Right Hand and Palm;  Surgeon: Roseanne Kaufman, MD;  Location: [facility];  Service: Orthopedics;  Laterality: Right;  . OMENTECTOMY N/A 12/25/2018   Procedure: OMENTECTOMY;  Surgeon: Lafonda Mosses, MD;   Location: [facility];  Service: Gynecology;  Laterality: N/A;  . SALPINGOOPHORECTOMY N/A 12/25/2018   Procedure: OPEN UNILATERAL  SALPINGO OOPHORECTOMY WITH STAGING;  Surgeon: Lafonda Mosses, MD;  Location: [facility];  Service: Gynecology;  Laterality: N/A;  . TONSILLECTOMY      SOCIAL HISTORY: Social History   Socioeconomic History  . Marital status: Married    Spouse name: Not on file  . Number of children: 3  . Years of education: Not on file  . Highest education level: High school graduate  Occupational History  . Not on file  Tobacco Use  . Smoking status: Never Smoker  . Smokeless tobacco: Never Used  Vaping Use  . Vaping Use: Never used  Substance and Sexual Activity  . Alcohol use: Not Currently  . Drug use: Not Currently    Comment: pos urine drug screens  . Sexual activity: Yes    Birth control/protection: None  Other Topics Concern  .  Not on file  Social History Narrative   ** Merged History Encounter **       Lives with family   Social Determinants of Health   Financial Resource Strain: Not on file  Food Insecurity: Not on file  Transportation Needs: Not on file  Physical Activity: Not on file  Stress: Not on file  Social Connections: Not on file  Intimate Partner Violence: Not on file    FAMILY HISTORY: Family History  Problem Relation Age of Onset  . Cancer Mother        cervical cancer  . Asthma Mother   . Hypertension Father   . Sickle cell anemia Father   . Asthma Sister   . Diabetes Maternal Aunt   . Lymphoma Maternal Grandmother     ALLERGIES:  is allergic to cheese, chocolate, ibuprofen, ivp dye [iodinated diagnostic agents], latex, orange juice [orange oil], other, peach [prunus persica], peanuts [peanut oil], pear, prednisone, raspberry, tylenol [acetaminophen], amoxicillin, apricot flavor, doxycycline, erythromycin, milk of magnesia [magnesium hydroxide], and penicillins.  MEDICATIONS:  Current Outpatient Medications  Medication Sig  Dispense Refill  . albuterol (VENTOLIN HFA) 108 (90 Base) MCG/ACT inhaler Inhale 2 puffs into the lungs every 4 (four) hours as needed for wheezing or shortness of breath. 18 g 1  . Cholecalciferol (VITAMIN D3) 50 MCG (2000 UT) capsule Take 1 capsule (2,000 Units total) by mouth daily. 90 capsule 1  . ferrous sulfate 325 (65 FE) MG tablet Take 1 tablet (325 mg total) by mouth daily with breakfast. 90 tablet 3  . ipratropium (ATROVENT HFA) 17 MCG/ACT inhaler Inhale 2 puffs into the lungs every 6 (six) hours as needed for wheezing. 1 each 12  . lacosamide 100 MG TABS Take 1 tablet (100 mg total) by mouth 2 (two) times daily. 60 tablet 5  . levETIRAcetam (KEPPRA) 750 MG tablet Take 2 tablets (1,500 mg total) by mouth 2 (two) times daily. 360 tablet 4  . montelukast (SINGULAIR) 10 MG tablet Take 1 tablet (10 mg total) by mouth at bedtime. 30 tablet 3  . potassium chloride SA (KLOR-CON) 20 MEQ tablet Take 20 mEq by mouth 2 (two) times daily.    . rizatriptan (MAXALT-MLT) 10 MG disintegrating tablet Take 1 tablet (10 mg total) by mouth as needed for migraine. May repeat in 2 hours if needed 9 tablet 11  . topiramate (TOPAMAX) 50 MG tablet Take 1 tablet (50 mg total) by mouth 2 (two) times daily. 60 tablet 12   No current facility-administered medications for this visit.    REVIEW OF SYSTEMS:   10 Point review of Systems was done is negative except as noted above.  PHYSICAL EXAMINATION: ECOG PERFORMANCE STATUS: 1 - Symptomatic but completely ambulatory  . Vitals:   02/24/20 1338  BP: 127/73  Pulse: 90  Resp: 18  Temp: 97.9 F (36.6 C)  SpO2: 100%   Filed Weights   02/24/20 1338  Weight: 133 lb 9.6 oz (60.6 kg)   .Body mass index is 22.23 kg/m.  GENERAL:alert, in no acute distress and comfortable SKIN: no acute rashes, no significant lesions EYES: conjunctiva are pink and non-injected, sclera anicteric OROPHARYNX: MMM, no exudates, no oropharyngeal erythema or ulceration NECK:  supple, no JVD LYMPH:  no palpable lymphadenopathy in the cervical, axillary or inguinal regions LUNGS: clear to auscultation b/l with normal respiratory effort HEART: regular rate & rhythm ABDOMEN:  normoactive bowel sounds , non tender, not distended. Extremity: no pedal edema PSYCH: alert & oriented x 3  with fluent speech NEURO: no focal motor/sensory deficits  LABORATORY DATA:  I have reviewed the data as listed  . CBC Latest Ref Rng & Units 02/24/2020 01/24/2020 01/24/2020  WBC 4.0 - 10.5 K/uL 11.7(H) 14.4(H) -  Hemoglobin 12.0 - 15.0 g/dL 9.5(L) 9.0(L) 12.9  Hematocrit 36.0 - 46.0 % 31.0(L) 28.8(L) 38.0  Platelets 150 - 400 K/uL 383 340 -   . CBC    Component Value Date/Time   WBC 11.7 (H) 02/24/2020 1406   RBC 4.38 02/24/2020 1406   HGB 9.5 (L) 02/24/2020 1406   HGB 9.9 (L) 12/22/2018 1356   HGB 11.9 08/20/2017 1144   HCT 31.0 (L) 02/24/2020 1406   HCT 38.0 08/20/2017 1144   PLT 383 02/24/2020 1406   PLT 257 12/22/2018 1356   PLT 294 08/20/2017 1144   MCV 70.8 (L) 02/24/2020 1406   MCV 87 08/20/2017 1144   MCH 21.7 (L) 02/24/2020 1406   MCHC 30.6 02/24/2020 1406   RDW 18.6 (H) 02/24/2020 1406   RDW 14.3 08/20/2017 1144   LYMPHSABS 3.6 02/24/2020 1406   LYMPHSABS 3.1 08/20/2017 1144   MONOABS 0.5 02/24/2020 1406   EOSABS 0.2 02/24/2020 1406   EOSABS 0.3 08/20/2017 1144   BASOSABS 0.1 02/24/2020 1406   BASOSABS 0.0 08/20/2017 1144     . CMP Latest Ref Rng & Units 02/24/2020 01/24/2020 01/24/2020  Glucose 70 - 99 mg/dL 87 92 108(H)  BUN 6 - 20 mg/dL 5(L) <5(L) <3(L)  Creatinine 0.44 - 1.00 mg/dL 0.75 0.75 0.90  Sodium 135 - 145 mmol/L 139 136 139  Potassium 3.5 - 5.1 mmol/L 3.1(L) 2.8(L) 2.2(LL)  Chloride 98 - 111 mmol/L 109 107 106  CO2 22 - 32 mmol/L 24 20(L) -  Calcium 8.9 - 10.3 mg/dL 8.9 8.2(L) -  Total Protein 6.5 - 8.1 g/dL 7.1 - -  Total Bilirubin 0.3 - 1.2 mg/dL 0.4 - -  Alkaline Phos 38 - 126 U/L 60 - -  AST 15 - 41 U/L 14(L) - -  ALT 0 - 44 U/L 7 - -       RADIOGRAPHIC STUDIES: I have personally reviewed the radiological images as listed and agreed with the findings in the report. No results found.  ASSESSMENT & PLAN:   30 yo with recurrent seizures and ?medical compliance with   1) Mild leucocytosis wbc 11.7k -- likely reactive in the setting of recurrent seizures, paronychia etc,  2) Microcytic anemia - likely iron deficiency due to heavy periods.  PLAN: --Discussed pt's history of seizures, surgeries, and symptoms.  -Advised pt she has microcytic anemia and will send out labs to observe if due to iron deficiency and rule out hemoglobinopathy.  -Will monitor Iron levels to observe if need for IV Iron. -Advised pt that at her age, the likelihood this is a clonal myeloproliferative neoplasm is very unlikely.  -Primary stress of current seizures, inflammation. No evidence of lymphadenopathy at this time from clinical examination. -Recommend PCP refer to Pulmonologist to f/u regarding history of Cystic Fibrosis and Sleep Apnea. -Will get labs today.  -Will see back over phone in 2 weeks.  FOLLOW UP: -labs today -Phone visit with Dr Irene Limbo in 2 weeks  All of the patients questions were answered with apparent satisfaction. The patient knows to call the clinic with any problems, questions or concerns.  I spent 40 minutes counseling the patient face to face. The total time spent in the appointment was 60 minutes and more than 50% was on counseling  and direct patient cares.    Sullivan Lone MD [place] AAHIVMS [facility] Hematology/Oncology Physician [facility]  (Office):       [phone] (Work cell):  [phone] (Fax):           [phone]  02/23/2020 6:10 PM  I, Reinaldo Raddle, am acting as scribe for Dr. Sullivan Lone, MD.   .I have reviewed the above documentation for accuracy and completeness, and I agree with the above. Brunetta Genera MD

## 2020-02-24 ENCOUNTER — Inpatient Hospital Stay: Payer: Medicaid Other | Attending: Gynecologic Oncology | Admitting: Hematology

## 2020-02-24 ENCOUNTER — Other Ambulatory Visit: Payer: Self-pay

## 2020-02-24 ENCOUNTER — Inpatient Hospital Stay: Payer: Medicaid Other

## 2020-02-24 VITALS — BP 127/73 | HR 90 | Temp 97.9°F | Resp 18 | Ht 65.0 in | Wt 133.6 lb

## 2020-02-24 DIAGNOSIS — D72829 Elevated white blood cell count, unspecified: Secondary | ICD-10-CM | POA: Diagnosis not present

## 2020-02-24 DIAGNOSIS — G40909 Epilepsy, unspecified, not intractable, without status epilepticus: Secondary | ICD-10-CM | POA: Diagnosis not present

## 2020-02-24 DIAGNOSIS — N920 Excessive and frequent menstruation with regular cycle: Secondary | ICD-10-CM | POA: Diagnosis not present

## 2020-02-24 DIAGNOSIS — D500 Iron deficiency anemia secondary to blood loss (chronic): Secondary | ICD-10-CM | POA: Insufficient documentation

## 2020-02-24 DIAGNOSIS — D509 Iron deficiency anemia, unspecified: Secondary | ICD-10-CM

## 2020-02-24 LAB — C-REACTIVE PROTEIN: CRP: 0.5 mg/dL (ref <1.0)

## 2020-02-24 LAB — CMP (CANCER CENTER ONLY)
ALT: 7 U/L (ref 0–44)
AST: 14 U/L — ABNORMAL LOW (ref 15–41)
Albumin: 3.9 g/dL (ref 3.5–5.0)
Alkaline Phosphatase: 60 U/L (ref 38–126)
Anion gap: 6 (ref 5–15)
BUN: 5 mg/dL — ABNORMAL LOW (ref 6–20)
CO2: 24 mmol/L (ref 22–32)
Calcium: 8.9 mg/dL (ref 8.9–10.3)
Chloride: 109 mmol/L (ref 98–111)
Creatinine: 0.75 mg/dL (ref 0.44–1.00)
GFR, Estimated: >60 mL/min (ref >60)
Glucose, Bld: 87 mg/dL (ref 70–99)
Potassium: 3.1 mmol/L — ABNORMAL LOW (ref 3.5–5.1)
Sodium: 139 mmol/L (ref 135–145)
Total Bilirubin: 0.4 mg/dL (ref 0.3–1.2)
Total Protein: 7.1 g/dL (ref 6.5–8.1)

## 2020-02-24 LAB — CBC WITH DIFFERENTIAL/PLATELET
Abs Immature Granulocytes: 0.02 10*3/uL (ref 0.00–0.07)
Basophils Absolute: 0.1 10*3/uL (ref 0.0–0.1)
Basophils Relative: 1 %
Eosinophils Absolute: 0.2 10*3/uL (ref 0.0–0.5)
Eosinophils Relative: 2 %
HCT: 31 % — ABNORMAL LOW (ref 36.0–46.0)
Hemoglobin: 9.5 g/dL — ABNORMAL LOW (ref 12.0–15.0)
Immature Granulocytes: 0 %
Lymphocytes Relative: 31 %
Lymphs Abs: 3.6 10*3/uL (ref 0.7–4.0)
MCH: 21.7 pg — ABNORMAL LOW (ref 26.0–34.0)
MCHC: 30.6 g/dL (ref 30.0–36.0)
MCV: 70.8 fL — ABNORMAL LOW (ref 80.0–100.0)
Monocytes Absolute: 0.5 10*3/uL (ref 0.1–1.0)
Monocytes Relative: 4 %
Neutro Abs: 7.3 10*3/uL (ref 1.7–7.7)
Neutrophils Relative %: 62 %
Platelets: 383 10*3/uL (ref 150–400)
RBC: 4.38 MIL/uL (ref 3.87–5.11)
RDW: 18.6 % — ABNORMAL HIGH (ref 11.5–15.5)
WBC: 11.7 10*3/uL — ABNORMAL HIGH (ref 4.0–10.5)
nRBC: 0 % (ref 0.0–0.2)

## 2020-02-24 LAB — SEDIMENTATION RATE: Sed Rate: 16 mm/hr (ref 0–22)

## 2020-02-24 LAB — IRON AND TIBC
Iron: 16 ug/dL — ABNORMAL LOW (ref 41–142)
Saturation Ratios: 4 % — ABNORMAL LOW (ref 21–57)
TIBC: 450 ug/dL — ABNORMAL HIGH (ref 236–444)
UIBC: 434 ug/dL — ABNORMAL HIGH (ref 120–384)

## 2020-02-24 LAB — VITAMIN B12: Vitamin B-12: 168 pg/mL — ABNORMAL LOW (ref 180–914)

## 2020-02-24 LAB — LACTATE DEHYDROGENASE: LDH: 99 U/L (ref 98–192)

## 2020-02-24 LAB — FERRITIN: Ferritin: 9 ng/mL — ABNORMAL LOW (ref 11–307)

## 2020-02-26 LAB — HGB FRACTIONATION CASCADE
Hgb A2: 2.3 % (ref 1.8–3.2)
Hgb A: 97.7 % (ref 96.4–98.8)
Hgb F: 0 % (ref 0.0–2.0)
Hgb S: 0 %

## 2020-02-28 ENCOUNTER — Encounter: Payer: Self-pay | Admitting: Hematology

## 2020-02-28 ENCOUNTER — Other Ambulatory Visit (INDEPENDENT_AMBULATORY_CARE_PROVIDER_SITE_OTHER): Payer: Self-pay | Admitting: Primary Care

## 2020-02-28 DIAGNOSIS — E876 Hypokalemia: Secondary | ICD-10-CM

## 2020-02-29 ENCOUNTER — Encounter (HOSPITAL_COMMUNITY): Payer: Self-pay | Admitting: Emergency Medicine

## 2020-02-29 ENCOUNTER — Emergency Department (HOSPITAL_COMMUNITY)
Admission: EM | Admit: 2020-02-29 | Discharge: 2020-02-29 | Disposition: A | Discharge status: Home / Self Care | Payer: Medicaid Other | Attending: Emergency Medicine | Admitting: Emergency Medicine

## 2020-02-29 DIAGNOSIS — L03012 Cellulitis of left finger: Secondary | ICD-10-CM | POA: Diagnosis not present

## 2020-02-29 DIAGNOSIS — Z8543 Personal history of malignant neoplasm of ovary: Secondary | ICD-10-CM | POA: Insufficient documentation

## 2020-02-29 DIAGNOSIS — Z91040 Latex allergy status: Secondary | ICD-10-CM | POA: Insufficient documentation

## 2020-02-29 DIAGNOSIS — R569 Unspecified convulsions: Secondary | ICD-10-CM | POA: Diagnosis not present

## 2020-02-29 DIAGNOSIS — J45901 Unspecified asthma with (acute) exacerbation: Secondary | ICD-10-CM | POA: Insufficient documentation

## 2020-02-29 DIAGNOSIS — G40509 Epileptic seizures related to external causes, not intractable, without status epilepticus: Secondary | ICD-10-CM | POA: Diagnosis not present

## 2020-02-29 DIAGNOSIS — M79645 Pain in left finger(s): Secondary | ICD-10-CM | POA: Diagnosis present

## 2020-02-29 MED ORDER — TOPIRAMATE 50 MG PO TABS: 50.0000 mg | ORAL_TABLET | Freq: Two times a day (BID) | ORAL | 1 refills | Status: AC

## 2020-02-29 MED ORDER — CLINDAMYCIN HCL 150 MG PO CAPS: 450.0000 mg | ORAL_CAPSULE | Freq: Three times a day (TID) | ORAL | 0 refills | Status: AC

## 2020-02-29 MED ORDER — CLINDAMYCIN HCL 300 MG PO CAPS
450.0000 mg | ORAL_CAPSULE | Freq: Once | ORAL | Status: AC
  Administered 2020-02-29: 450 mg via ORAL
  Filled 2020-02-29: qty 1

## 2020-02-29 MED ORDER — ACETAMINOPHEN 325 MG PO TABS
650.0000 mg | ORAL_TABLET | Freq: Once | ORAL | Status: AC
  Administered 2020-02-29: 650 mg via ORAL
  Filled 2020-02-29: qty 2

## 2020-02-29 MED ORDER — IBUPROFEN 200 MG PO TABS
600.0000 mg | ORAL_TABLET | Freq: Once | ORAL | Status: AC
  Administered 2020-02-29: 600 mg via ORAL
  Filled 2020-02-29: qty 3

## 2020-02-29 MED ORDER — TOPIRAMATE 25 MG PO TABS
50.0000 mg | ORAL_TABLET | Freq: Two times a day (BID) | ORAL | Status: DC
  Administered 2020-02-29: 50 mg via ORAL
  Filled 2020-02-29: qty 2

## 2020-02-29 MED ORDER — LEVETIRACETAM 750 MG PO TABS: 750.0000 mg | ORAL_TABLET | Freq: Two times a day (BID) | ORAL | 1 refills | Status: AC

## 2020-02-29 MED ORDER — LEVETIRACETAM IN NACL 1500 MG/100ML IV SOLN
1500.0000 mg | Freq: Once | INTRAVENOUS | Status: AC
  Administered 2020-02-29: 1500 mg via INTRAVENOUS
  Filled 2020-02-29: qty 100

## 2020-02-29 NOTE — ED Provider Notes (Signed)
[facility] DEPT Provider Note   CSN: [id number] Arrival date & time: 02/29/20  1434     History Chief Complaint  Patient presents with  . Seizures    Jasmine [name] is a 30 y.o. female with a history of seizures presenting to the emergency department with concern for left thumb pain. She says she has had swelling at the base of her left fingernail for a couple of days. The pain is very severe. She is never had this issue before. She denies fevers or chills.  When asked about the seizure reported in triage, the patient reports she had a seizure today. She tells me "Oh, I have them all the time.  It's because I ran out of my meds."  I clarified she hasn't had keppra or topiramate in 2 days because she ran out of pills.  She reports she is on Keppra 500 mg BID and topiramate 50 mg BID.  She feels back to mental baseline now.    She states her neurologist lowered her dosing of AED recently due to concerns about side effects (weight loss).  HPI     Past Medical History:  Diagnosis Date  . Asthma   . Cancer ([place])    ovarian  . Complication of anesthesia    "I wake up during anesthesia" (10/14/2015)  . DVT (deep vein thrombosis) in pregnancy   . Epilepsy ([place])    hx of seizures since childhood, most recent 01/07/20  . GERD (gastroesophageal reflux disease)   . Heart murmur    last work up age 18- no symtoms  . Pancreatitis   . Seizures ([place])   . Sickle cell trait ([place])   . Sleep apnea    "used to wear CPAP; don't have one here in [place] since I moved in 2014" (10/14/2015)    Patient Active Problem List   Diagnosis Date Noted  . Status epilepticus ([place]) 03/28/2019  . Moderate persistent asthma 03/19/2019  . 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 Pelvic mass in female 12/25/2018  . Abnormal uterine bleeding   . Pelvic mass 12/22/2018  . Dehydration 12/22/2018  . Asthma exacerbation  12/01/2017  . Acute hypercapnic respiratory failure ([place]) 12/01/2017  . Exocrine pancreatic insufficiency 12/01/2017  . Facial contusion 07/17/2017  . Closed dislocation of right jaw 07/17/2017  . Nausea with hives 07/09/2017  . History of DVT (deep vein thrombosis) in pregnancy ([place]) 05/29/2017  . Sickle cell trait ([place]) 05/29/2017  . Acute asthma exacerbation 05/29/2017  . Chronic pancreatitis ([place]) 05/29/2017  . Seizures ([place]) 05/29/2017  . Tobacco abuse 05/29/2017  . Acute on chronic respiratory failure with hypoxemia ([place]) 05/29/2017  . Pneumonia 05/04/2017  . SOB (shortness of breath) 04/08/2017  . Cough 04/07/2017  . 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 Acute on chronic respiratory failure with hypoxia ([place]) 03/10/2017  . Colitis 11/12/2016  . Seizure ([place]) 11/12/2016  . Calculus of bile duct with acute cholecystitis with obstruction 10/14/2015  . Adjustment disorder with mixed anxiety and depressed mood 04/26/2015  . Low blood potassium 04/19/2015  . Asthma with acute exacerbation 04/19/2015  . Asthma 01/03/2015  . Asthma, chronic, unspecified asthma severity, with acute exacerbation 01/03/2015  . Influenza-like illness 01/03/2015  . Status post cesarean section 11/08/2014  . Previous cesarean section complicating pregnancy, antepartum condition or complication   . GERD (gastroesophageal reflux disease)   . Gastroesophageal reflux disease without esophagitis   . Abnormal biochemical finding  on antenatal screening of mother   . Abnormal quad screen   . Echogenic focus of heart of fetus affecting antepartum care of mother   . Drug use affecting pregnancy, antepartum 05/19/2014  . Seizure disorder during pregnancy, antepartum ([place]) 05/13/2014  . Supervision of high risk pregnancy, antepartum 05/13/2014  . Asthma complicating pregnancy, antepartum 05/13/2014  . History of food anaphylaxis 05/13/2014  . Hypokalemia 04/08/2014  . Seizure disorder, sept 2015 last seizure  01/19/2013  . Microcytic anemia 01/19/2013  . Leukocytosis 01/19/2013    Past Surgical History:  Procedure Laterality Date  . APPENDECTOMY    . CESAREAN SECTION  2013  . CESAREAN SECTION N/A 11/08/2014   Procedure: CESAREAN SECTION;  Surgeon: Truett Mainland, DO;  Location: [facility];  Service: Obstetrics;  Laterality: N/A;  . CHOLECYSTECTOMY N/A 10/16/2015   Procedure: LAPAROSCOPIC CHOLECYSTECTOMY WITH POSSIBLE INTRAOPERATIVE CHOLANGIOGRAM;  Surgeon: Donnie Mesa, MD;  Location: [facility];  Service: General;  Laterality: N/A;  . CLOSED REDUCTION MANDIBLE WITH MANDIBULOMA N/A 07/17/2017   Procedure: REDUCTION TMJ WITH INTRAMAXILLARY FIXATION;  Surgeon: Diona Browner, DDS;  Location: [facility];  Service: Oral Surgery;  Laterality: N/A;  . ESOPHAGOGASTRODUODENOSCOPY (EGD) WITH PROPOFOL N/A 11/15/2016   Procedure: ESOPHAGOGASTRODUODENOSCOPY (EGD) WITH PROPOFOL;  Surgeon: Clarene Essex, MD;  Location: [facility];  Service: Endoscopy;  Laterality: N/A;  . INGUINAL HERNIA REPAIR Bilateral ~ 1996  . NERVE, TENDON AND ARTERY REPAIR Right 09/23/2012   Procedure: I&D and Repair As Necessary/Right Hand and Palm;  Surgeon: Roseanne Kaufman, MD;  Location: [facility];  Service: Orthopedics;  Laterality: Right;  . OMENTECTOMY N/A 12/25/2018   Procedure: OMENTECTOMY;  Surgeon: Lafonda Mosses, MD;  Location: [facility];  Service: Gynecology;  Laterality: N/A;  . SALPINGOOPHORECTOMY N/A 12/25/2018   Procedure: OPEN UNILATERAL  SALPINGO OOPHORECTOMY WITH STAGING;  Surgeon: Lafonda Mosses, MD;  Location: [facility];  Service: Gynecology;  Laterality: N/A;  . TONSILLECTOMY       OB History    Gravida  3   Para  3   Term  3   Preterm  0   AB  0   Living  3     SAB  0   IAB  0   Ectopic  0   Multiple      Live Births  3           Family History  Problem Relation Age of Onset  . Cancer Mother        cervical cancer  . Asthma Mother   . Hypertension Father   . Sickle cell anemia Father   . Asthma  Sister   . Diabetes Maternal Aunt   . Lymphoma Maternal Grandmother     Social History   Tobacco Use  . Smoking status: Never Smoker  . Smokeless tobacco: Never Used  Vaping Use  . Vaping Use: Never used  Substance Use Topics  . Alcohol use: Not Currently  . Drug use: Not Currently    Comment: pos urine drug screens    Home Medications Prior to Admission medications   Medication Sig Start Date End Date Taking? Authorizing Provider  clindamycin (CLEOCIN) 150 MG capsule Take 3 capsules (450 mg total) by mouth 3 (three) times daily for 6 days. 02/29/20 03/06/20 Yes Wyvonnia Dusky, MD  levETIRAcetam (KEPPRA) 750 MG tablet Take 1 tablet (750 mg total) by mouth 2 (two) times daily. 03/01/20 03/31/20 Yes Wyvonnia Dusky, MD  topiramate (TOPAMAX) 50 MG tablet Take 1  tablet (50 mg total) by mouth 2 (two) times daily. 03/01/20 03/31/20 Yes Adrieana Fennelly, Carola Rhine, MD  albuterol (VENTOLIN HFA) 108 (90 Base) MCG/ACT inhaler Inhale 2 puffs into the lungs every 4 (four) hours as needed for wheezing or shortness of breath. 01/06/20   Kerin Perna, NP  Cholecalciferol (VITAMIN D3) 50 MCG (2000 UT) capsule Take 1 capsule (2,000 Units total) by mouth daily. 02/01/20   Kerin Perna, NP  ferrous sulfate 325 (65 FE) MG tablet Take 1 tablet (325 mg total) by mouth daily with breakfast. 12/02/19   Kerin Perna, NP  ipratropium (ATROVENT HFA) 17 MCG/ACT inhaler Inhale 2 puffs into the lungs every 6 (six) hours as needed for wheezing. 12/02/19   Kerin Perna, NP  lacosamide 100 MG TABS Take 1 tablet (100 mg total) by mouth 2 (two) times daily. 01/11/20   Penumalli, Earlean Polka, MD  levETIRAcetam (KEPPRA) 750 MG tablet Take 2 tablets (1,500 mg total) by mouth 2 (two) times daily. 01/11/20   Penumalli, Earlean Polka, MD  montelukast (SINGULAIR) 10 MG tablet Take 1 tablet (10 mg total) by mouth at bedtime. 12/02/19   Kerin Perna, NP  potassium chloride SA (KLOR-CON) 20 MEQ tablet Take 20 mEq  by mouth 2 (two) times daily.    [provider]  rizatriptan (MAXALT-MLT) 10 MG disintegrating tablet Take 1 tablet (10 mg total) by mouth as needed for migraine. May repeat in 2 hours if needed 08/04/19   Penumalli, Earlean Polka, MD  topiramate (TOPAMAX) 50 MG tablet Take 1 tablet (50 mg total) by mouth 2 (two) times daily. 01/11/20   Penumalli, Earlean Polka, MD  cetirizine (ZYRTEC) 10 MG tablet Take 1 tablet (10 mg total) by mouth daily. 12/11/17 08/04/19  Tasia Catchings, Amy V, PA-C    Allergies    Cheese, Chocolate, Ibuprofen, Ivp dye [iodinated diagnostic agents], Latex, Orange juice [orange oil], Other, Peach [prunus persica], Peanuts [peanut oil], Pear, Prednisone, Raspberry, Tylenol [acetaminophen], Amoxicillin, Apricot flavor, Doxycycline, Erythromycin, Milk of magnesia [magnesium hydroxide], and Penicillins  Review of Systems   Review of Systems  Constitutional: Negative for chills and fever.  Respiratory: Negative for cough and shortness of breath.   Cardiovascular: Negative for chest pain and palpitations.  Gastrointestinal: Negative for abdominal pain and vomiting.  Musculoskeletal: Positive for arthralgias and myalgias.  Skin: Negative for color change and rash.  Neurological: Positive for seizures. Negative for speech difficulty.  All other systems reviewed and are negative.   Physical Exam Updated Vital Signs BP 103/70   Pulse 68   Temp 98 F (36.7 C) (Oral)   Resp 11   SpO2 100%   Physical Exam Constitutional:      General: She is not in acute distress. HENT:     Head: Normocephalic and atraumatic.  Eyes:     Conjunctiva/sclera: Conjunctivae normal.     Pupils: Pupils are equal, round, and reactive to light.  Cardiovascular:     Rate and Rhythm: Normal rate and regular rhythm.  Pulmonary:     Effort: Pulmonary effort is normal. No respiratory distress.  Musculoskeletal:     Comments: Paronychia of left thumb, minimal overlying erythema No tenderness of flexor surface   Skin:    General: Skin is warm and dry.  Neurological:     General: No focal deficit present.     Mental Status: She is alert and oriented to person, place, and time. Mental status is at baseline.  Psychiatric:  Mood and Affect: Mood normal.        Behavior: Behavior normal.     ED Results / Procedures / Treatments   Labs (all labs ordered are listed, but only abnormal results are displayed) Labs Reviewed - No data to display  EKG None  Radiology No results found.  Procedures .Marland KitchenIncision and Drainage  Date/Time: 02/29/2020 5:22 PM Performed by: Wyvonnia Dusky, MD Authorized by: Wyvonnia Dusky, MD   Consent:    Consent obtained:  Verbal   Consent given by:  Parent   Risks, benefits, and alternatives were discussed: yes     Risks discussed:  Incomplete drainage, infection and pain   Alternatives discussed:  Alternative treatment and observation Universal protocol:    Procedure explained and questions answered to patient or proxy's satisfaction: yes     Site/side marked: yes     Patient identity confirmed:  Arm band Location:    Type:  Abscess   Location:  Upper extremity   Upper extremity location:  Hand   Hand location:  L hand Pre-procedure details:    Skin preparation:  Antiseptic wash Sedation:    Sedation type:  None Anesthesia:    Anesthesia method:  Nerve block   Block location:  Thumb, left   Block needle gauge:  24 G   Block anesthetic:  Lidocaine 1% w/o epi   Block injection procedure:  Anatomic landmarks identified, introduced needle, negative aspiration for blood, incremental injection and anatomic landmarks palpated   Block outcome:  Anesthesia achieved Procedure type:    Complexity:  Simple Procedure details:    Needle aspiration: no     Incision types:  Stab incision   Wound management:  Probed and deloculated   Drainage:  Purulent   Drainage amount:  Scant (1 cc) Post-procedure details:    Procedure completion:  Tolerated well,  no immediate complications     Medications Ordered in ED Medications  topiramate (TOPAMAX) tablet 50 mg (50 mg Oral Given 02/29/20 1555)  levETIRAcetam (KEPPRA) IVPB 1500 mg/ 100 mL premix (0 mg Intravenous Stopped 02/29/20 1718)  ibuprofen (ADVIL) tablet 600 mg (600 mg Oral Given 02/29/20 1555)  acetaminophen (TYLENOL) tablet 650 mg (650 mg Oral Given 02/29/20 1555)  clindamycin (CLEOCIN) capsule 450 mg (450 mg Oral Given 02/29/20 1554)    ED Course  I have reviewed the triage vital signs and the nursing notes.  Pertinent labs & imaging results that were available during my care of the patient were reviewed by me and considered in my medical decision making (see chart for details).  30 yo female here with paronychia of left thumb, also noted to have had seizure earlier today.  She is well appearing on exam, and back to mental baseline.  No evidence of significant head trauma.  1.  Paronychia - we'll attempt to drain this at bedside.  She has a poor pain tolerance - we'll try PO motrin, tylenol, and use ice topically, as the digital block will likely be too painful for her.  Regardless of outcome, I advise she continue soaking her thumb in hot water at home and gentle massage to express pus this week.  We can start on clindamycin for 7 days.    2.  Seizure - breakthrough seizure in setting of medication noncompliance.  Per medical chart review this unfortunately is a recurring issue for her.  She reports she simply ran out of her meds.  I asked if she can fill the scripts if I write  them, and she said yes.  We'll provide an IV keppra loading dose as she had a seizure today and PO topamax, and then discharge with a refill prescription.  I have a lower suspicion for ICH, acute infection, or alternative metabolic cause of seizures at this time.  She is back to mental baseline.  I advised no driving for 6 months or until cleared by neurologist.     Final Clinical Impression(s) / ED Diagnoses Final  diagnoses:  Seizure ([place])  Paronychia of left thumb    Rx / DC Orders ED Discharge Orders         Ordered    levETIRAcetam (KEPPRA) 750 MG tablet  2 times daily        02/29/20 1709    topiramate (TOPAMAX) 50 MG tablet  2 times daily        02/29/20 1709    clindamycin (CLEOCIN) 150 MG capsule  3 times daily        02/29/20 1709           Wyvonnia Dusky, MD 02/29/20 1724

## 2020-02-29 NOTE — Discharge Instructions (Signed)
Important instructions  I drained your finger infection today.  Your next dose of clindamycin, the antibiotic, is TONIGHT at bedtime.  You should complete the full 7 day course.  Remember to soak your thumb in hot water for 10 minutes at a time, 3 times per day.  Do this for the next 2 days.  Gently massage your thumb like I showed you while doing this.    For your SEIZURES, it is very important you restart and take your seizure medications daily.  I gave you a dose of both medications in the ER.  Your next dose of both seizure medications is tomorrow morning.  Call your neurologist to schedule a follow up appointment tomorrow.  Please pick up your prescriptions from your pharmacy.

## 2020-02-29 NOTE — ED Triage Notes (Signed)
Per pt, states she had a seizure today-states she is having left hand pain and a headache-appears to be post ictal-slow to respond to questions

## 2020-03-03 LAB — ALPHA-THALASSEMIA GENOTYPR

## 2020-03-07 ENCOUNTER — Ambulatory Visit (INDEPENDENT_AMBULATORY_CARE_PROVIDER_SITE_OTHER): Payer: Medicaid Other | Admitting: Primary Care

## 2020-03-09 NOTE — Progress Notes (Signed)
HEMATOLOGY/ONCOLOGY CONSULTATION NOTE  Date of Service: 03/10/2020  Patient Care Team: Jasmine Perna, NP as PCP - General (Internal Medicine) Jasmine Demark, PA-C (Physician Assistant)  CHIEF COMPLAINTS/PURPOSE OF CONSULTATION:  Elevated WBC  HISTORY OF PRESENTING ILLNESS:  Jasmine [name] is a wonderful 30 y.o. female who has been referred to [place] by Jasmine Mire, NP for evaluation and management of elevated WBC. The pt reports that she is doing well overall.  01/24/2020 Levetiracetam levels were undetectable, which is suggestive of not taking medications. The pt notes she is taking her medications currently and was told this was due to her vomiting.  The pt reports that she has never seen a blood doctor before, noting a history of anemia and low WBC.The pt has a history of seizures. This has been ocurring since she ws a child. Her current Nuerologist is Jasmine [name]. She notes her last fall was yesterday. She notes she had a fall four months ago that has left very big bruises and knots all on her legs. She notes she recently had to line and pad her bedding and the corners in her houses due to injuring herself frequently. She has still been injuring herself despite this. The pt also has a history of asthma, worsening when it is cold. She has an Albuterol inhaler she uses prn. The pt denies any steroid or Prednisone use. The pt also notes that she has a history of Cystic Fibrosis, and frequent respiratory infections. She denies any Pulmonologist.   The pt also deals with fatigue and difficulty sleeping. She used a CPAP machine when she was a kid, but notes she doe not think she needs it anymore and does not use it. She has not done any sleep tests recently, but still experiences trouble sleeping and is very fatigued.  The pt has a history of Pancreatitis when she was in high school, but denies any current issues related to this outside of stomach pain. The pt was diagnosed  with a pelvic mass and Pancreatic Cancer around two years ago and had a surgery on 12/25/2018 for this. She still follows up with Jasmine [name].   The pt notes that she is from [place] and moved here around when she was 30 years old. The pt notes that she has three children, all girls, and a husband. Her husband assists her in making medical decisions when needed. The pt has a history of sickle cell trait.   The pt notes that she used to smoke mariajuana but stopped around four months ago. She denies any history of cigarette smoking and does not consume alcohol. The pt notes no changes in food intake.   The pt notes she recently has been experiencing night sweats for 1-2 months and is drenched in her groin area and underarm area. She notes these night sweats are drenching.  The pt has a history of anemia and heavy periods. She still currently experiences heavy periods. The pt takes daily Iron Supplements BID  For over three months now at this time. The pt also experiences migraine headaches and takes Maxalt  for this prn every four hours. She is also on potassium replacement and denies any known allergies. The pt notes she was never instructed why her potassium levels were so low. The pt still takes Singular on a daily basis. The pt takes her potassium regularly.  The pt's Vitamin D levels are low, but she takes Vitamin D supplementation. She denies any other Vitamin supplementation.  The pt notes she has been experiencing dark stools recently. The pt notes that she received transfusions in the past as a child due to need for blood. She was unaware of why she needed this, but denies any recent PRBC. The pt notes that she is unaware of any allergies to IV Iron. The pt is allergic to the IVP dye used for CT scans. The pt notes she had her gallbladder removed in 2017.   On review of systems, pt reports chills, night sweats, black stools, migraines, seizures, extreme fatigue, bruising, abdominal soreness over  gallbladder and denies fevers, decreased appetite, bloody stools, unexpected weight loss, and any other symptoms.  INTERVAL HISTORY I connected with Jasmine [name] on 03/10/2020 by telephone and verified that I am speaking with the correct person using two identifiers.   I discussed the limitations of evaluation and management by telemedicine. The patient expressed understanding and agreed to proceed.   Other persons participating in the visit and their role in the encounter:                                                         - Jasmine [name], Medical Scribe     Patient's location: Home Provider's location: [place] [facility] [employer] is a wonderful 31 y.o. female who is here today for f/u regarding evaluation and management of elevated WBC. The patient's last visit with [place] was on 02/24/2020. The pt reports that she is doing well overall.  The pt reports that she had another seizure and ED visit on 02/29/2020. She notes no concerns or symptoms at this time. The pt denies having heavy menstrual periods. She has been taking Ferrous Sulfate for a few months BID.   Lab results 02/24/2020 of CBC w/diff and CMP is as follows: all values are WNL except for WBC of 11.7K, Hgb of 9.5, HCT of 31.0, MCV of 70.8, CH of 21.7, RDW of 18.6, Potassium of 3.1, BUN of 5, AST of 14.  02/24/2020 Sedimentation Rate of 16. 02/24/2020 CRP of 0.5.  02/24/2020 Ferritin of 9. 02/24/2020 Iron of 16, TIBC of 450, Sat Ratio of 4, UIBC of 434. 02/24/2020 Vitamin B12 of 168.  02/24/2020 LDH of 99. 02/24/2020 Hgb F of 0.0, Hgb A of 97.7, Hgb A2 of 2.3, Hgb S of 0.0.  02/24/2020 Alpha-Thalassemia was negative.  02/24/2020 BCR-ABL was undelivered.  On review of systems, pt reports fatigue and denies heavy menstrual bleeding and any other symptoms.  MEDICAL HISTORY:  Past Medical History:  Diagnosis Date  . Asthma   . Cancer ([place])    ovarian  . Complication of anesthesia    "I wake up during anesthesia"  (10/14/2015)  . DVT (deep vein thrombosis) in pregnancy   . Epilepsy ([place])    hx of seizures since childhood, most recent 01/07/20  . GERD (gastroesophageal reflux disease)   . Heart murmur    last work up age 72- no symtoms  . Pancreatitis   . Seizures ([place])   . Sickle cell trait ([place])   . Sleep apnea    "used to wear CPAP; don't have one here in [place] since I moved in 2014" (10/14/2015)    SURGICAL HISTORY: Past Surgical History:  Procedure Laterality Date  . APPENDECTOMY    . CESAREAN SECTION  2013  .  CESAREAN SECTION N/A 11/08/2014   Procedure: CESAREAN SECTION;  Surgeon: Truett Mainland, DO;  Location: [facility];  Service: Obstetrics;  Laterality: N/A;  . CHOLECYSTECTOMY N/A 10/16/2015   Procedure: LAPAROSCOPIC CHOLECYSTECTOMY WITH POSSIBLE INTRAOPERATIVE CHOLANGIOGRAM;  Surgeon: Donnie Mesa, MD;  Location: [facility];  Service: General;  Laterality: N/A;  . CLOSED REDUCTION MANDIBLE WITH MANDIBULOMA N/A 07/17/2017   Procedure: REDUCTION TMJ WITH INTRAMAXILLARY FIXATION;  Surgeon: Diona Browner, DDS;  Location: [facility];  Service: Oral Surgery;  Laterality: N/A;  . ESOPHAGOGASTRODUODENOSCOPY (EGD) WITH PROPOFOL N/A 11/15/2016   Procedure: ESOPHAGOGASTRODUODENOSCOPY (EGD) WITH PROPOFOL;  Surgeon: Clarene Essex, MD;  Location: [facility];  Service: Endoscopy;  Laterality: N/A;  . INGUINAL HERNIA REPAIR Bilateral ~ 1996  . NERVE, TENDON AND ARTERY REPAIR Right 09/23/2012   Procedure: I&D and Repair As Necessary/Right Hand and Palm;  Surgeon: Roseanne Kaufman, MD;  Location: [facility];  Service: Orthopedics;  Laterality: Right;  . OMENTECTOMY N/A 12/25/2018   Procedure: OMENTECTOMY;  Surgeon: Lafonda Mosses, MD;  Location: [facility];  Service: Gynecology;  Laterality: N/A;  . SALPINGOOPHORECTOMY N/A 12/25/2018   Procedure: OPEN UNILATERAL  SALPINGO OOPHORECTOMY WITH STAGING;  Surgeon: Lafonda Mosses, MD;  Location: [facility];  Service: Gynecology;  Laterality: N/A;  . TONSILLECTOMY      SOCIAL  HISTORY: Social History   Socioeconomic History  . Marital status: Married    Spouse name: Not on file  . Number of children: 3  . Years of education: Not on file  . Highest education level: High school graduate  Occupational History  . Not on file  Tobacco Use  . Smoking status: Never Smoker  . Smokeless tobacco: Never Used  Vaping Use  . Vaping Use: Never used  Substance and Sexual Activity  . Alcohol use: Not Currently  . Drug use: Not Currently    Comment: pos urine drug screens  . Sexual activity: Yes    Birth control/protection: None  Other Topics Concern  . Not on file  Social History Narrative   ** Merged History Encounter **       Lives with family   Social Determinants of Health   Financial Resource Strain: Not on file  Food Insecurity: Not on file  Transportation Needs: Not on file  Physical Activity: Not on file  Stress: Not on file  Social Connections: Not on file  Intimate Partner Violence: Not on file    FAMILY HISTORY: Family History  Problem Relation Age of Onset  . Cancer Mother        cervical cancer  . Asthma Mother   . Hypertension Father   . Sickle cell anemia Father   . Asthma Sister   . Diabetes Maternal Aunt   . Lymphoma Maternal Grandmother     ALLERGIES:  is allergic to cheese, chocolate, ibuprofen, ivp dye [iodinated diagnostic agents], latex, orange juice [orange oil], other, peach [prunus persica], peanuts [peanut oil], pear, prednisone, raspberry, tylenol [acetaminophen], amoxicillin, apricot flavor, doxycycline, erythromycin, milk of magnesia [magnesium hydroxide], and penicillins.  MEDICATIONS:  Current Outpatient Medications  Medication Sig Dispense Refill  . albuterol (VENTOLIN HFA) 108 (90 Base) MCG/ACT inhaler Inhale 2 puffs into the lungs every 4 (four) hours as needed for wheezing or shortness of breath. 18 g 1  . Cholecalciferol (VITAMIN D3) 50 MCG (2000 UT) capsule Take 1 capsule (2,000 Units total) by mouth daily. 90  capsule 1  . ferrous sulfate 325 (65 FE) MG tablet Take 1 tablet (325 mg total)  by mouth daily with breakfast. 90 tablet 3  . ipratropium (ATROVENT HFA) 17 MCG/ACT inhaler Inhale 2 puffs into the lungs every 6 (six) hours as needed for wheezing. 1 each 12  . lacosamide 100 MG TABS Take 1 tablet (100 mg total) by mouth 2 (two) times daily. 60 tablet 5  . levETIRAcetam (KEPPRA) 750 MG tablet Take 2 tablets (1,500 mg total) by mouth 2 (two) times daily. 360 tablet 4  . levETIRAcetam (KEPPRA) 750 MG tablet Take 1 tablet (750 mg total) by mouth 2 (two) times daily. 60 tablet 1  . montelukast (SINGULAIR) 10 MG tablet Take 1 tablet (10 mg total) by mouth at bedtime. 30 tablet 3  . potassium chloride SA (KLOR-CON) 20 MEQ tablet Take 20 mEq by mouth 2 (two) times daily.    . rizatriptan (MAXALT-MLT) 10 MG disintegrating tablet Take 1 tablet (10 mg total) by mouth as needed for migraine. May repeat in 2 hours if needed 9 tablet 11  . topiramate (TOPAMAX) 50 MG tablet Take 1 tablet (50 mg total) by mouth 2 (two) times daily. 60 tablet 12  . topiramate (TOPAMAX) 50 MG tablet Take 1 tablet (50 mg total) by mouth 2 (two) times daily. 60 tablet 1   No current facility-administered medications for this visit.    REVIEW OF SYSTEMS:   10 Point review of Systems was done is negative except as noted above.  PHYSICAL EXAMINATION: ECOG PERFORMANCE STATUS: 1 - Symptomatic but completely ambulatory  There were no vitals filed for this visit. There were no vitals filed for this visit. .There is no height or weight on file to calculate BMI.  Telehealth Visit.  LABORATORY DATA:  I have reviewed the data as listed  . CBC Latest Ref Rng & Units 02/24/2020 01/24/2020 01/24/2020  WBC 4.0 - 10.5 K/uL 11.7(H) 14.4(H) -  Hemoglobin 12.0 - 15.0 g/dL 9.5(L) 9.0(L) 12.9  Hematocrit 36.0 - 46.0 % 31.0(L) 28.8(L) 38.0  Platelets 150 - 400 K/uL 383 340 -   . CBC    Component Value Date/Time   WBC 11.7 (H) 02/24/2020 1406    RBC 4.38 02/24/2020 1406   HGB 9.5 (L) 02/24/2020 1406   HGB 9.9 (L) 12/22/2018 1356   HGB 11.9 08/20/2017 1144   HCT 31.0 (L) 02/24/2020 1406   HCT 38.0 08/20/2017 1144   PLT 383 02/24/2020 1406   PLT 257 12/22/2018 1356   PLT 294 08/20/2017 1144   MCV 70.8 (L) 02/24/2020 1406   MCV 87 08/20/2017 1144   MCH 21.7 (L) 02/24/2020 1406   MCHC 30.6 02/24/2020 1406   RDW 18.6 (H) 02/24/2020 1406   RDW 14.3 08/20/2017 1144   LYMPHSABS 3.6 02/24/2020 1406   LYMPHSABS 3.1 08/20/2017 1144   MONOABS 0.5 02/24/2020 1406   EOSABS 0.2 02/24/2020 1406   EOSABS 0.3 08/20/2017 1144   BASOSABS 0.1 02/24/2020 1406   BASOSABS 0.0 08/20/2017 1144     . CMP Latest Ref Rng & Units 02/24/2020 01/24/2020 01/24/2020  Glucose 70 - 99 mg/dL 87 92 108(H)  BUN 6 - 20 mg/dL 5(L) <5(L) <3(L)  Creatinine 0.44 - 1.00 mg/dL 0.75 0.75 0.90  Sodium 135 - 145 mmol/L 139 136 139  Potassium 3.5 - 5.1 mmol/L 3.1(L) 2.8(L) 2.2(LL)  Chloride 98 - 111 mmol/L 109 107 106  CO2 22 - 32 mmol/L 24 20(L) -  Calcium 8.9 - 10.3 mg/dL 8.9 8.2(L) -  Total Protein 6.5 - 8.1 g/dL 7.1 - -  Total Bilirubin 0.3 -  1.2 mg/dL 0.4 - -  Alkaline Phos 38 - 126 U/L 60 - -  AST 15 - 41 U/L 14(L) - -  ALT 0 - 44 U/L 7 - -      RADIOGRAPHIC STUDIES: I have personally reviewed the radiological images as listed and agreed with the findings in the report. No results found.  ASSESSMENT & PLAN:   30 yo with recurrent seizures and ?medical compliance with   1) Mild leucocytosis wbc 11.7k -- likely reactive in the setting of recurrent seizures, paronychia etc,  2) Microcytic anemia - likely iron deficiency due to heavy periods.  PLAN: -Discussed pt labwork, 02/24/2020; Ferritin and Iron Sat very low. Potassium very low. Sedimentation Rate low. LDH normal. WBC nearly normalized. -Advised pt that LDH being normal rules our myeloproliferative neoplasm. -Advised pt that Sedimentation rate being low rules out Hodgkin's Lymphoma. -Advised  pt the microcytic anemia is all due to iron deficiency following review of all labs done.  -Discussed the reassuring nature of the findings. The elevated WBC were most likely due to a stressor such as a seizure.  -Advised pt that is very unlikely given labs and age to be a bone marrow problem. -Discussed continued iron deficiency despite oral Iron. Advised pt of IV iron and ability to correct iron much quicker. The pt is agreeable to get IV Iron. -Recommended pt start taking OTC Vitamin B12. Will send to pharmacy, at least 1000 mcg. Continue if pt is already taking. -Will see back 3 months with labs following completion of IV iron.  FOLLOW UP: IV Injectafer weekly x 2  as soon as possible RTC with Dr Irene Limbo with labs in 3 months  All of the patients questions were answered with apparent satisfaction. The patient knows to call the clinic with any problems, questions or concerns.  The total time spent in the appointment was 20 minutes and more than 50% was on counseling and direct patient cares.   Sullivan Lone MD [place] AAHIVMS [facility] Hematology/Oncology Physician [facility]  (Office):       [phone] (Work cell):  [phone] (Fax):           [phone]  03/10/2020 2:39 PM  I, Jasmine [name], am acting as scribe for Dr. Sullivan Lone, MD.  .I have reviewed the above documentation for accuracy and completeness, and I agree with the above. Brunetta Genera MD

## 2020-03-10 ENCOUNTER — Inpatient Hospital Stay (HOSPITAL_BASED_OUTPATIENT_CLINIC_OR_DEPARTMENT_OTHER): Payer: Medicaid Other | Admitting: Hematology

## 2020-03-10 DIAGNOSIS — D500 Iron deficiency anemia secondary to blood loss (chronic): Secondary | ICD-10-CM | POA: Diagnosis not present

## 2020-03-11 ENCOUNTER — Ambulatory Visit (INDEPENDENT_AMBULATORY_CARE_PROVIDER_SITE_OTHER): Payer: Medicaid Other | Admitting: Primary Care

## 2020-03-11 ENCOUNTER — Encounter (INDEPENDENT_AMBULATORY_CARE_PROVIDER_SITE_OTHER): Payer: Self-pay

## 2020-03-13 ENCOUNTER — Other Ambulatory Visit (INDEPENDENT_AMBULATORY_CARE_PROVIDER_SITE_OTHER): Payer: Self-pay | Admitting: Primary Care

## 2020-03-13 NOTE — Telephone Encounter (Signed)
Requested Prescriptions  Pending Prescriptions Disp Refills  . albuterol (VENTOLIN HFA) 108 (90 Base) MCG/ACT inhaler [Pharmacy Med Name: ALBUTEROL HFA INH (200 PUFFS)8.5GM] 18 g 1    Sig: INHALE 2 PUFFS INTO THE LUNGS EVERY 4 HOURS AS NEEDED FOR WHEEZING OR SHORTNESS OF BREATH     Pulmonology:  Beta Agonists Failed - 03/13/2020  2:49 PM      Failed - One inhaler should last at least one month. If the patient is requesting refills earlier, contact the patient to check for uncontrolled symptoms.      Passed - Valid encounter within last 12 months    Recent Outpatient Visits          3 weeks ago Abnormal blood finding   [place] Kerin Perna, NP   3 months ago Hospital discharge follow-up   [place], [place], NP   2 years ago Nonintractable headache, unspecified chronicity pattern, unspecified headache type   [place] Gildardo Pounds, NP   2 years ago Seizures [facility])   [place] Clent Demark, PA-C   4 years ago Calculus of gallbladder without cholecystitis without obstruction   [facility] [place], Leda Quail, MD

## 2020-03-15 DIAGNOSIS — J45901 Unspecified asthma with (acute) exacerbation: Secondary | ICD-10-CM | POA: Diagnosis not present

## 2020-03-15 DIAGNOSIS — J45909 Unspecified asthma, uncomplicated: Secondary | ICD-10-CM | POA: Diagnosis not present

## 2020-03-15 DIAGNOSIS — G4739 Other sleep apnea: Secondary | ICD-10-CM | POA: Diagnosis not present

## 2020-03-15 DIAGNOSIS — R2689 Other abnormalities of gait and mobility: Secondary | ICD-10-CM | POA: Diagnosis not present

## 2020-03-18 ENCOUNTER — Inpatient Hospital Stay: Payer: Medicaid Other | Attending: Gynecologic Oncology

## 2020-03-18 ENCOUNTER — Other Ambulatory Visit: Payer: Self-pay

## 2020-03-18 VITALS — BP 115/81 | HR 68 | Temp 97.4°F | Resp 16

## 2020-03-18 DIAGNOSIS — D509 Iron deficiency anemia, unspecified: Secondary | ICD-10-CM | POA: Diagnosis present

## 2020-03-18 DIAGNOSIS — D500 Iron deficiency anemia secondary to blood loss (chronic): Secondary | ICD-10-CM

## 2020-03-18 MED ORDER — SODIUM CHLORIDE 0.9 % IV SOLN
750.0000 mg | Freq: Once | INTRAVENOUS | Status: AC
  Administered 2020-03-18: 750 mg via INTRAVENOUS
  Filled 2020-03-18: qty 15

## 2020-03-18 MED ORDER — SODIUM CHLORIDE 0.9 % IV SOLN
Freq: Once | INTRAVENOUS | Status: AC
  Filled 2020-03-18: qty 250

## 2020-03-18 MED ORDER — LORATADINE 10 MG PO TABS
ORAL_TABLET | ORAL | Status: AC
  Filled 2020-03-18: qty 1

## 2020-03-18 MED ORDER — LORATADINE 10 MG PO TABS
10.0000 mg | ORAL_TABLET | Freq: Once | ORAL | Status: AC
  Administered 2020-03-18: 10 mg via ORAL

## 2020-03-24 ENCOUNTER — Inpatient Hospital Stay: Payer: Medicaid Other

## 2020-03-24 ENCOUNTER — Other Ambulatory Visit: Payer: Self-pay

## 2020-03-24 VITALS — BP 110/47 | HR 76 | Temp 98.7°F | Resp 17

## 2020-03-24 DIAGNOSIS — D500 Iron deficiency anemia secondary to blood loss (chronic): Secondary | ICD-10-CM

## 2020-03-24 DIAGNOSIS — D509 Iron deficiency anemia, unspecified: Secondary | ICD-10-CM | POA: Diagnosis not present

## 2020-03-24 MED ORDER — LORATADINE 10 MG PO TABS
ORAL_TABLET | ORAL | Status: AC
  Filled 2020-03-24: qty 1

## 2020-03-24 MED ORDER — SODIUM CHLORIDE 0.9 % IV SOLN
Freq: Once | INTRAVENOUS | Status: AC
  Filled 2020-03-24: qty 250

## 2020-03-24 MED ORDER — SODIUM CHLORIDE 0.9 % IV SOLN
750.0000 mg | Freq: Once | INTRAVENOUS | Status: AC
Start: 2020-03-24 — End: 2020-03-24
  Administered 2020-03-24: 750 mg via INTRAVENOUS
  Filled 2020-03-24: qty 15

## 2020-03-24 MED ORDER — LORATADINE 10 MG PO TABS
10.0000 mg | ORAL_TABLET | Freq: Once | ORAL | Status: AC
  Administered 2020-03-24: 10 mg via ORAL

## 2020-03-24 NOTE — Progress Notes (Signed)
Patient was observed for 30 minutes post iron infusion with no signs of adverse reaction. Vitals stable upon discharge and patient in no distress.

## 2020-03-24 NOTE — Patient Instructions (Signed)
Ferric carboxymaltose injection What is this medicine? FERRIC CARBOXYMALTOSE (ferr-ik car-box-ee-mol-toes) is an iron complex. Iron is used to make healthy red blood cells, which carry oxygen and nutrients throughout the body. This medicine is used to treat anemia in people with chronic kidney disease or people who cannot take iron by mouth. This medicine may be used for other purposes; ask your health care provider or pharmacist if you have questions. COMMON BRAND NAME(S): Injectafer What should I tell my health care provider before I take this medicine? They need to know if you have any of these conditions:  high levels of iron in the blood  liver disease  an unusual or allergic reaction to iron, other medicines, foods, dyes, or preservatives  pregnant or trying to get pregnant  breast-feeding How should I use this medicine? This medicine is for infusion into a vein. It is given by a health care professional in a hospital or clinic setting. Talk to your pediatrician regarding the use of this medicine in children. Special care may be needed. Overdosage: If you think you have taken too much of this medicine contact a poison control center or emergency room at once. NOTE: This medicine is only for you. Do not share this medicine with others. What if I miss a dose? Keep appointments for follow-up doses. It is important not to miss your dose. Call your care team if you are unable to keep an appointment. What may interact with this medicine? Do not take this medicine with any of the following medications:  deferoxamine  dimercaprol  other iron products This list may not describe all possible interactions. Give your health care provider a list of all the medicines, herbs, non-prescription drugs, or dietary supplements you use. Also tell them if you smoke, drink alcohol, or use illegal drugs. Some items may interact with your medicine. What should I watch for while using this  medicine? Visit your doctor or health care professional regularly. Tell your doctor if your symptoms do not start to get better or if they get worse. You may need blood work done while you are taking this medicine. You may need to follow a special diet. Talk to your doctor. Foods that contain iron include: whole grains/cereals, dried fruits, beans, or peas, leafy green vegetables, and organ meats (liver, kidney). What side effects may I notice from receiving this medicine? Side effects that you should report to your doctor or health care professional as soon as possible:  allergic reactions like skin rash, itching or hives, swelling of the face, lips, or tongue  dizziness  facial flushing Side effects that usually do not require medical attention (report to your doctor or health care professional if they continue or are bothersome):  changes in taste  constipation  headache  nausea, vomiting  pain, redness, or irritation at site where injected This list may not describe all possible side effects. Call your doctor for medical advice about side effects. You may report side effects to FDA at 1-800-FDA-1088. Where should I keep my medicine? This drug is given in a hospital or clinic and will not be stored at home. NOTE: This sheet is a summary. It may not cover all possible information. If you have questions about this medicine, talk to your doctor, pharmacist, or health care provider.  2021 Elsevier/Gold Standard (2019-12-15 14:00:47)  

## 2020-04-07 ENCOUNTER — Emergency Department (HOSPITAL_COMMUNITY)
Admission: EM | Admit: 2020-04-07 | Discharge: 2020-04-07 | Disposition: A | Discharge status: Home / Self Care | Payer: Medicaid Other | Attending: Emergency Medicine | Admitting: Emergency Medicine

## 2020-04-07 ENCOUNTER — Emergency Department (HOSPITAL_COMMUNITY): Payer: Medicaid Other

## 2020-04-07 ENCOUNTER — Other Ambulatory Visit: Payer: Self-pay

## 2020-04-07 ENCOUNTER — Encounter (HOSPITAL_COMMUNITY): Payer: Self-pay

## 2020-04-07 DIAGNOSIS — J4540 Moderate persistent asthma, uncomplicated: Secondary | ICD-10-CM | POA: Insufficient documentation

## 2020-04-07 DIAGNOSIS — R569 Unspecified convulsions: Secondary | ICD-10-CM

## 2020-04-07 DIAGNOSIS — S79919A Unspecified injury of unspecified hip, initial encounter: Secondary | ICD-10-CM | POA: Insufficient documentation

## 2020-04-07 DIAGNOSIS — Z91040 Latex allergy status: Secondary | ICD-10-CM | POA: Insufficient documentation

## 2020-04-07 DIAGNOSIS — M25569 Pain in unspecified knee: Secondary | ICD-10-CM | POA: Diagnosis not present

## 2020-04-07 DIAGNOSIS — Z8543 Personal history of malignant neoplasm of ovary: Secondary | ICD-10-CM | POA: Diagnosis not present

## 2020-04-07 DIAGNOSIS — W06XXXA Fall from bed, initial encounter: Secondary | ICD-10-CM | POA: Diagnosis not present

## 2020-04-07 DIAGNOSIS — G40909 Epilepsy, unspecified, not intractable, without status epilepticus: Secondary | ICD-10-CM | POA: Insufficient documentation

## 2020-04-07 LAB — I-STAT BETA HCG BLOOD, ED (MC, WL, AP ONLY): I-stat hCG, quantitative: <5 m[IU]/mL (ref <5)

## 2020-04-07 LAB — I-STAT CHEM 8, ED
BUN: 11 mg/dL (ref 6–20)
Calcium, Ion: 1.25 mmol/L (ref 1.15–1.40)
Chloride: 111 mmol/L (ref 98–111)
Creatinine, Ser: 0.7 mg/dL (ref 0.44–1.00)
Glucose, Bld: 82 mg/dL (ref 70–99)
HCT: 38 % (ref 36.0–46.0)
Hemoglobin: 12.9 g/dL (ref 12.0–15.0)
Potassium: 4.1 mmol/L (ref 3.5–5.1)
Sodium: 140 mmol/L (ref 135–145)
TCO2: 21 mmol/L — ABNORMAL LOW (ref 22–32)

## 2020-04-07 LAB — CBG MONITORING, ED: Glucose-Capillary: 93 mg/dL (ref 70–99)

## 2020-04-07 NOTE — ED Notes (Signed)
Patient stated that she had to leave, her IV was removed, patient was told that she can come back if needed

## 2020-04-07 NOTE — ED Provider Notes (Signed)
[facility] DEPT Provider Note   CSN: [id number] Arrival date & time: 04/07/20  1414     History Chief Complaint  Patient presents with  . Seizures  . Head Injury    Jasmine [name] is a 30 y.o. female.  HPI   Patient presents to the ED for evaluation of hip injury after seizure.  Patient has a history of seizure disorder.  She has frequent seizures occurring about 4 times per week.  She is on medications and is followed by [facility] neurology.  Patient states she had a typical seizure for her that was witnessed by her husband.  Patient states she was postictal for period of time and does not remember what happened.  She does think that she fell off the bed though and landed on her left side and is now having pain primarily in her hip and knee.  Notes the patient's primary concern about spine she came to the ED via to get checked out.  He is not having any fevers or chills.  No severe headache.  No vomiting or diarrhea.  Patient has been compliant with all her medications.  Past Medical History:  Diagnosis Date  . Asthma   . Cancer ([place])    ovarian  . Complication of anesthesia    "I wake up during anesthesia" (10/14/2015)  . DVT (deep vein thrombosis) in pregnancy   . Epilepsy ([place])    hx of seizures since childhood, most recent 01/07/20  . GERD (gastroesophageal reflux disease)   . Heart murmur    last work up age 55- no symtoms  . Pancreatitis   . Seizures ([place])   . Sickle cell trait ([place])   . Sleep apnea    "used to wear CPAP; don't have one here in [place] since I moved in 2014" (10/14/2015)    Patient Active Problem List   Diagnosis Date Noted  . Iron deficiency anemia due to chronic blood loss 03/10/2020  . Status epilepticus ([place]) 03/28/2019  . Moderate persistent asthma 03/19/2019  . 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 Pelvic mass in female 12/25/2018  . Abnormal uterine  bleeding   . Pelvic mass 12/22/2018  . Dehydration 12/22/2018  . Asthma exacerbation 12/01/2017  . Acute hypercapnic respiratory failure ([place]) 12/01/2017  . Exocrine pancreatic insufficiency 12/01/2017  . Facial contusion 07/17/2017  . Closed dislocation of right jaw 07/17/2017  . Nausea with hives 07/09/2017  . History of DVT (deep vein thrombosis) in pregnancy ([place]) 05/29/2017  . Sickle cell trait ([place]) 05/29/2017  . Acute asthma exacerbation 05/29/2017  . Chronic pancreatitis ([place]) 05/29/2017  . Seizures ([place]) 05/29/2017  . Tobacco abuse 05/29/2017  . Acute on chronic respiratory failure with hypoxemia ([place]) 05/29/2017  . Pneumonia 05/04/2017  . SOB (shortness of breath) 04/08/2017  . Cough 04/07/2017  . 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 Acute on chronic respiratory failure with hypoxia ([place]) 03/10/2017  . Colitis 11/12/2016  . Seizure ([place]) 11/12/2016  . Calculus of bile duct with acute cholecystitis with obstruction 10/14/2015  . Adjustment disorder with mixed anxiety and depressed mood 04/26/2015  . Low blood potassium 04/19/2015  . Asthma with acute exacerbation 04/19/2015  . Asthma 01/03/2015  . Asthma, chronic, unspecified asthma severity, with acute exacerbation 01/03/2015  . Influenza-like illness 01/03/2015  . Status post cesarean section 11/08/2014  . Previous cesarean section complicating pregnancy, antepartum condition or complication   . GERD (gastroesophageal reflux disease)   .  Gastroesophageal reflux disease without esophagitis   . Abnormal biochemical finding on antenatal screening of mother   . Abnormal quad screen   . Echogenic focus of heart of fetus affecting antepartum care of mother   . Drug use affecting pregnancy, antepartum 05/19/2014  . Seizure disorder during pregnancy, antepartum ([place]) 05/13/2014  . Supervision of high risk pregnancy, antepartum 05/13/2014  . Asthma complicating pregnancy, antepartum 05/13/2014  . History of food anaphylaxis  05/13/2014  . Hypokalemia 04/08/2014  . Seizure disorder, sept 2015 last seizure 01/19/2013  . Microcytic anemia 01/19/2013  . Leukocytosis 01/19/2013    Past Surgical History:  Procedure Laterality Date  . APPENDECTOMY    . CESAREAN SECTION  2013  . CESAREAN SECTION N/A 11/08/2014   Procedure: CESAREAN SECTION;  Surgeon: Truett Mainland, DO;  Location: [facility];  Service: Obstetrics;  Laterality: N/A;  . CHOLECYSTECTOMY N/A 10/16/2015   Procedure: LAPAROSCOPIC CHOLECYSTECTOMY WITH POSSIBLE INTRAOPERATIVE CHOLANGIOGRAM;  Surgeon: Donnie Mesa, MD;  Location: [facility];  Service: General;  Laterality: N/A;  . CLOSED REDUCTION MANDIBLE WITH MANDIBULOMA N/A 07/17/2017   Procedure: REDUCTION TMJ WITH INTRAMAXILLARY FIXATION;  Surgeon: Diona Browner, DDS;  Location: [facility];  Service: Oral Surgery;  Laterality: N/A;  . ESOPHAGOGASTRODUODENOSCOPY (EGD) WITH PROPOFOL N/A 11/15/2016   Procedure: ESOPHAGOGASTRODUODENOSCOPY (EGD) WITH PROPOFOL;  Surgeon: Clarene Essex, MD;  Location: [facility];  Service: Endoscopy;  Laterality: N/A;  . INGUINAL HERNIA REPAIR Bilateral ~ 1996  . NERVE, TENDON AND ARTERY REPAIR Right 09/23/2012   Procedure: I&D and Repair As Necessary/Right Hand and Palm;  Surgeon: Roseanne Kaufman, MD;  Location: [facility];  Service: Orthopedics;  Laterality: Right;  . OMENTECTOMY N/A 12/25/2018   Procedure: OMENTECTOMY;  Surgeon: Lafonda Mosses, MD;  Location: [facility];  Service: Gynecology;  Laterality: N/A;  . SALPINGOOPHORECTOMY N/A 12/25/2018   Procedure: OPEN UNILATERAL  SALPINGO OOPHORECTOMY WITH STAGING;  Surgeon: Lafonda Mosses, MD;  Location: [facility];  Service: Gynecology;  Laterality: N/A;  . TONSILLECTOMY       OB History    Gravida  3   Para  3   Term  3   Preterm  0   AB  0   Living  3     SAB  0   IAB  0   Ectopic  0   Multiple      Live Births  3           Family History  Problem Relation Age of Onset  . Cancer Mother        cervical cancer  .  Asthma Mother   . Hypertension Father   . Sickle cell anemia Father   . Asthma Sister   . Diabetes Maternal Aunt   . Lymphoma Maternal Grandmother     Social History   Tobacco Use  . Smoking status: Never Smoker  . Smokeless tobacco: Never Used  Vaping Use  . Vaping Use: Never used  Substance Use Topics  . Alcohol use: Not Currently  . Drug use: Not Currently    Comment: pos urine drug screens    Home Medications Prior to Admission medications   Medication Sig Start Date End Date Taking? Authorizing Provider  albuterol (VENTOLIN HFA) 108 (90 Base) MCG/ACT inhaler INHALE 2 PUFFS INTO THE LUNGS EVERY 4 HOURS AS NEEDED FOR WHEEZING OR SHORTNESS OF BREATH 03/13/20   Kerin Perna, NP  Cholecalciferol (VITAMIN D3) 50 MCG (2000 UT) capsule Take 1 capsule (2,000 Units total) by mouth  daily. 02/01/20   Kerin Perna, NP  ferrous sulfate 325 (65 FE) MG tablet Take 1 tablet (325 mg total) by mouth daily with breakfast. 12/02/19   Kerin Perna, NP  ipratropium (ATROVENT HFA) 17 MCG/ACT inhaler Inhale 2 puffs into the lungs every 6 (six) hours as needed for wheezing. 12/02/19   Kerin Perna, NP  lacosamide 100 MG TABS Take 1 tablet (100 mg total) by mouth 2 (two) times daily. 01/11/20   Penumalli, Earlean Polka, MD  levETIRAcetam (KEPPRA) 750 MG tablet Take 2 tablets (1,500 mg total) by mouth 2 (two) times daily. 01/11/20   Penumalli, Earlean Polka, MD  levETIRAcetam (KEPPRA) 750 MG tablet Take 1 tablet (750 mg total) by mouth 2 (two) times daily. 03/01/20 03/31/20  Wyvonnia Dusky, MD  montelukast (SINGULAIR) 10 MG tablet Take 1 tablet (10 mg total) by mouth at bedtime. 12/02/19   Kerin Perna, NP  potassium chloride SA (KLOR-CON) 20 MEQ tablet Take 20 mEq by mouth 2 (two) times daily.    [provider]  rizatriptan (MAXALT-MLT) 10 MG disintegrating tablet Take 1 tablet (10 mg total) by mouth as needed for migraine. May repeat in 2 hours if needed 08/04/19    Penumalli, Earlean Polka, MD  topiramate (TOPAMAX) 50 MG tablet Take 1 tablet (50 mg total) by mouth 2 (two) times daily. 01/11/20   Penumalli, Earlean Polka, MD  topiramate (TOPAMAX) 50 MG tablet Take 1 tablet (50 mg total) by mouth 2 (two) times daily. 03/01/20 03/31/20  Wyvonnia Dusky, MD  cetirizine (ZYRTEC) 10 MG tablet Take 1 tablet (10 mg total) by mouth daily. 12/11/17 08/04/19  Tasia Catchings, Amy V, PA-C    Allergies    Cheese, Chocolate, Ibuprofen, Ivp dye [iodinated diagnostic agents], Latex, Orange juice [orange oil], Other, Peach [prunus persica], Peanuts [peanut oil], Pear, Prednisone, Raspberry, Tylenol [acetaminophen], Amoxicillin, Apricot flavor, Doxycycline, Erythromycin, Milk of magnesia [magnesium hydroxide], and Penicillins  Review of Systems   Review of Systems  All other systems reviewed and are negative.   Physical Exam Updated Vital Signs BP 103/71 (BP Location: Left Arm)   Pulse 73   Temp 98.4 F (36.9 C) (Oral)   Resp 16   Ht 1.651 m (5\' 5" )   Wt 60.8 kg   LMP 03/20/2020   SpO2 99%   BMI 22.30 kg/m   Physical Exam Vitals and nursing note reviewed.  Constitutional:      General: She is not in acute distress.    Appearance: She is well-developed.  HENT:     Head: Normocephalic and atraumatic.     Right Ear: External ear normal.     Left Ear: External ear normal.  Eyes:     General: No scleral icterus.       Right eye: No discharge.        Left eye: No discharge.     Conjunctiva/sclera: Conjunctivae normal.  Neck:     Trachea: No tracheal deviation.  Cardiovascular:     Rate and Rhythm: Normal rate and regular rhythm.  Pulmonary:     Effort: Pulmonary effort is normal. No respiratory distress.     Breath sounds: Normal breath sounds. No stridor. No wheezing or rales.  Abdominal:     General: Bowel sounds are normal. There is no distension.     Palpations: Abdomen is soft.     Tenderness: There is no abdominal tenderness. There is no guarding or rebound.   Musculoskeletal:  General: Tenderness present.     Cervical back: Neck supple.     Left hip: Tenderness and bony tenderness present. No deformity.     Left knee: No swelling, effusion or erythema. Tenderness present.     Left ankle: Normal.  Skin:    General: Skin is warm and dry.     Findings: No rash.  Neurological:     General: No focal deficit present.     Mental Status: She is alert and oriented to person, place, and time.     Cranial Nerves: No cranial nerve deficit (no facial droop, extraocular movements intact, no slurred speech).     Sensory: No sensory deficit.     Motor: No abnormal muscle tone or seizure activity.     Coordination: Coordination normal.     ED Results / Procedures / Treatments   Labs (all labs ordered are listed, but only abnormal results are displayed) Labs Reviewed  I-STAT CHEM 8, ED - Abnormal; Notable for the following components:      Result Value   TCO2 21 (*)    All other components within normal limits  CBG MONITORING, ED  I-STAT BETA HCG BLOOD, ED ([facility], [facility], [facility] ONLY)    Procedures Procedures   Medications Ordered in ED Medications - No data to display  ED Course  I have reviewed the triage vital signs and the nursing notes.  Pertinent labs & imaging results that were available during my care of the patient were reviewed by me and considered in my medical decision making (see chart for details).    MDM Rules/Calculators/A&P                          Labs and xrays were ordered after my initial evaluation.    Discussed plan with patient.  Notified by staff that pt ending up leaving shortly after my evaluation.  She had to go pick up her daughter. Final Clinical Impression(s) / ED Diagnoses Final diagnoses:  Seizure [facility])    Rx / DC Orders ED Discharge Orders    None       Dorie Rank, MD 04/07/20 1540

## 2020-04-07 NOTE — ED Triage Notes (Addendum)
Patient states she had a seizure prior to coming to the ED that was witnessed by her husband. Patient states she was told she hit her head and patient c/o left hip and leg pain.

## 2020-04-08 ENCOUNTER — Telehealth: Payer: Self-pay | Admitting: *Deleted

## 2020-04-08 NOTE — Telephone Encounter (Signed)
Transition Care Management Follow-up Telephone Call  Date of discharge and from where: 04/07/2020 - [facility]  How have you been since you were released from the hospital? "Still in some pain"  Any questions or concerns? No  Items Reviewed:  Did the pt receive and understand the discharge instructions provided? Yes   Medications obtained and verified? Yes   Other? No   Any new allergies since your discharge? No   Dietary orders reviewed? No  Do you have support at home? Yes    Functional Questionnaire: (I = Independent and D = Dependent) ADLs: I  Bathing/Dressing- I  Meal Prep- I  Eating- I  Maintaining continence- I  Transferring/Ambulation- I  Managing Meds- I  Follow up appointments reviewed:   PCP Hospital f/u appt confirmed? No    Specialist Hospital f/u appt confirmed? No    Are transportation arrangements needed? No   If their condition worsens, is the pt aware to call PCP or go to the Emergency Dept.? Yes  Was the patient provided with contact information for the PCP's office or ED? Yes  Was to pt encouraged to call back with questions or concerns? Yes

## 2020-04-15 DIAGNOSIS — G4739 Other sleep apnea: Secondary | ICD-10-CM | POA: Diagnosis not present

## 2020-04-15 DIAGNOSIS — J45909 Unspecified asthma, uncomplicated: Secondary | ICD-10-CM | POA: Diagnosis not present

## 2020-04-15 DIAGNOSIS — J45901 Unspecified asthma with (acute) exacerbation: Secondary | ICD-10-CM | POA: Diagnosis not present

## 2020-04-15 DIAGNOSIS — R2689 Other abnormalities of gait and mobility: Secondary | ICD-10-CM | POA: Diagnosis not present

## 2020-05-04 ENCOUNTER — Telehealth: Payer: Self-pay | Admitting: Primary Care

## 2020-05-04 NOTE — Telephone Encounter (Signed)
I attempted to reach Jasmine [name] today to get her rescheduled for a phone visit with the Managed Medicaid RNCM. I was not able to leave a message. I will reach out again in the next few days.

## 2020-06-08 ENCOUNTER — Telehealth: Payer: Self-pay | Admitting: Primary Care

## 2020-06-08 NOTE — Telephone Encounter (Signed)
..   Medicaid Managed Care   Unsuccessful Outreach Note  06/08/2020 Name: Jasmine [name] MRN: [id number] DOB: 12/04/1990  Referred by: Kerin Perna, NP Reason for referral : High Risk Managed Medicaid (I attempted to reach [facility] today to see if she wanted to reschedule with the [facility] RNCM for a phone visit. She was not available and I was told to try back after 4:00.)   A second unsuccessful telephone outreach was attempted today. The patient was referred to the case management team for assistance with care management and care coordination.   Follow Up Plan: The care management team will reach out to the patient again over the next 2 days.   [place]

## 2020-06-09 ENCOUNTER — Ambulatory Visit: Payer: Medicaid Other | Admitting: Oncology

## 2020-06-09 ENCOUNTER — Inpatient Hospital Stay: Payer: Medicaid Other | Attending: Gynecologic Oncology

## 2020-06-09 ENCOUNTER — Other Ambulatory Visit: Payer: Medicaid Other

## 2020-06-09 ENCOUNTER — Inpatient Hospital Stay: Payer: Medicaid Other | Admitting: Hematology

## 2020-06-15 DIAGNOSIS — R2689 Other abnormalities of gait and mobility: Secondary | ICD-10-CM | POA: Diagnosis not present

## 2020-06-15 DIAGNOSIS — J45909 Unspecified asthma, uncomplicated: Secondary | ICD-10-CM | POA: Diagnosis not present

## 2020-06-15 DIAGNOSIS — J45901 Unspecified asthma with (acute) exacerbation: Secondary | ICD-10-CM | POA: Diagnosis not present

## 2020-06-15 DIAGNOSIS — G4739 Other sleep apnea: Secondary | ICD-10-CM | POA: Diagnosis not present

## 2020-07-15 DIAGNOSIS — R2689 Other abnormalities of gait and mobility: Secondary | ICD-10-CM | POA: Diagnosis not present

## 2020-07-15 DIAGNOSIS — J45901 Unspecified asthma with (acute) exacerbation: Secondary | ICD-10-CM | POA: Diagnosis not present

## 2020-07-15 DIAGNOSIS — J45909 Unspecified asthma, uncomplicated: Secondary | ICD-10-CM | POA: Diagnosis not present

## 2020-07-15 DIAGNOSIS — G4739 Other sleep apnea: Secondary | ICD-10-CM | POA: Diagnosis not present

## 2020-08-05 ENCOUNTER — Other Ambulatory Visit: Payer: Self-pay | Admitting: Diagnostic Neuroimaging

## 2020-08-08 ENCOUNTER — Telehealth: Payer: Self-pay | Admitting: Diagnostic Neuroimaging

## 2020-08-08 NOTE — Telephone Encounter (Signed)
Pt called and LVM. Most of the message was not understandable but she mentioned she needing to speak to the RN regarding her medication and picking it up in a pharmacy where she moved to. Please advise.

## 2020-08-08 NOTE — Telephone Encounter (Signed)
LVM requesting call back.

## 2020-08-09 NOTE — Telephone Encounter (Signed)
I called pt. No answer, left a message asking pt to call me back.   

## 2020-08-11 MED ORDER — LACOSAMIDE 100 MG PO TABS: 100.0000 mg | ORAL_TABLET | Freq: Two times a day (BID) | ORAL | 0 refills | Status: DC

## 2020-08-11 MED ORDER — LEVETIRACETAM 750 MG PO TABS: 1500.0000 mg | ORAL_TABLET | Freq: Two times a day (BID) | ORAL | 4 refills | Status: AC

## 2020-08-11 MED ORDER — TOPIRAMATE 50 MG PO TABS: 50.0000 mg | ORAL_TABLET | Freq: Two times a day (BID) | ORAL | 12 refills | Status: AC

## 2020-08-11 NOTE — Addendum Note (Signed)
Addended by: Verlin Grills on: 08/11/2020 01:01 PM   Modules accepted: Orders

## 2020-08-11 NOTE — Telephone Encounter (Signed)
Meds ordered this encounter  Medications   topiramate (TOPAMAX) 50 MG tablet    Sig: Take 1 tablet (50 mg total) by mouth 2 (two) times daily.    Dispense:  60 tablet    Refill:  12   levETIRAcetam (KEPPRA) 750 MG tablet    Sig: Take 2 tablets (1,500 mg total) by mouth 2 (two) times daily.    Dispense:  360 tablet    Refill:  4   Lacosamide 100 MG TABS    Sig: Take 1 tablet (100 mg total) by mouth 2 (two) times daily.    Dispense:  60 tablet    Refill:  0    Penni Bombard, MD [id number], [id number] PM Certified in Neurology, Neurophysiology and Neuroimaging  [facility] [address] [place], [place] [address] [phone]

## 2020-08-11 NOTE — Telephone Encounter (Signed)
Pt responded via my chart. In summary of the messages from 08/11/2020; Pt is out of town in [place] and needs her Keppra, Vimpat, and Topamax refilled. Preferred pharmacy [facility] located on  [id number], [place], [place] phone number [phone].  Pt relayed she has been experiencing seizures every two- three days and a minimum of two seizures at each event.  She confirmed her medication dosage as follows:  Keppra 750 mg (1500 mg) twice per day and Vimpat 100 mg twice per day. Pt reports compliance with medications and denies any recent illness, sleep deprivation, or dehydration. Pt wanted to know if her dosage could be increased to provide better control for seizure events?  Pt is scheduled for f/u on 10/11/2020 with AL, NP.  Will discuss with Dr. Leta Baptist once he reports to the office this afternoon.  To note, pt is on Topamax 50 mg twice per day as well for H/a migraine management.

## 2020-08-11 NOTE — Telephone Encounter (Signed)
I have sent a my chart message to the pt asking for clarification on this message.

## 2020-08-11 NOTE — Addendum Note (Signed)
Addended by: Andrey Spearman R on: 08/11/2020 01:17 PM   Modules accepted: Orders

## 2020-08-11 NOTE — Telephone Encounter (Addendum)
I have sent update to the pt on meds being refilled and we are going to hold off on changes in dosage and regroup at up coming appt with Amy, NP for dosage adjustments.

## 2020-08-15 DIAGNOSIS — R2689 Other abnormalities of gait and mobility: Secondary | ICD-10-CM | POA: Diagnosis not present

## 2020-08-15 DIAGNOSIS — G4739 Other sleep apnea: Secondary | ICD-10-CM | POA: Diagnosis not present

## 2020-08-15 DIAGNOSIS — J45901 Unspecified asthma with (acute) exacerbation: Secondary | ICD-10-CM | POA: Diagnosis not present

## 2020-08-15 DIAGNOSIS — J45909 Unspecified asthma, uncomplicated: Secondary | ICD-10-CM | POA: Diagnosis not present

## 2020-09-12 ENCOUNTER — Other Ambulatory Visit: Payer: Self-pay

## 2020-09-12 ENCOUNTER — Telehealth: Payer: Self-pay | Admitting: Primary Care

## 2020-09-12 ENCOUNTER — Other Ambulatory Visit: Payer: Self-pay | Admitting: *Deleted

## 2020-09-12 DIAGNOSIS — G40909 Epilepsy, unspecified, not intractable, without status epilepticus: Secondary | ICD-10-CM

## 2020-09-12 MED ORDER — VIMPAT 100 MG PO TABS: 100.0000 mg | ORAL_TABLET | Freq: Two times a day (BID) | ORAL | 5 refills | Status: AC

## 2020-09-12 NOTE — Patient Outreach (Signed)
Care Coordination  09/12/2020  Jasmine [name] 1990/05/17 [id number]   Medicaid Managed Care   Unsuccessful Outreach Note  09/12/2020 Name: Jasmine [name] MRN: [id number] DOB: Jun 09, 1990  Referred by: Kerin Perna, NP Reason for referral : Case Closure (RNCM Case Closure)   Third unsuccessful telephone outreach was attempted today. The patient was referred to the case management team for assistance with care management and care coordination. The patient's primary care provider has been notified of our unsuccessful attempts to make or maintain contact with the patient. The care management team is pleased to engage with this patient at any time in the future should he/she be interested in assistance from the care management team.   Follow Up Plan: We have been unable to make contact with the patient for follow up. The care management team is available to follow up with the patient after provider conversation with the patient regarding recommendation for care management engagement and subsequent re-referral to the care management team.   Lurena Joiner RN, BSN [facility] RN Care Coordinator

## 2020-09-12 NOTE — Telephone Encounter (Signed)
..   Medicaid Managed Care   Unsuccessful Outreach Note  09/12/2020 Name: Jasmine [name] MRN: [id number] DOB: [birth year]  Referred by: Kerin Perna, NP Reason for referral : High Risk Managed Medicaid (Attempted to reach patient today to get her rescheduled with the [facility] for a phone visit. Patient was not available per the person that answered the phone.)   Third unsuccessful telephone outreach was attempted today. The patient was referred to the case management team for assistance with care management and care coordination. The patient's primary care provider has been notified of our unsuccessful attempts to make or maintain contact with the patient. The care management team is pleased to engage with this patient at any time in the future should he/she be interested in assistance from the care management team.   Follow Up Plan: We have been unable to make contact with the patient for follow up. The care management team is available to follow up with the patient after provider conversation with the patient regarding recommendation for care management engagement and subsequent re-referral to the care management team.   [place], [place]

## 2020-09-12 NOTE — Progress Notes (Signed)
Per patient insurance, brand name preferred. Rx re-written for Vimpat '100mg'$  BID, DAW.   Meds ordered this encounter  Medications   VIMPAT 100 MG TABS    Sig: Take 1 tablet (100 mg total) by mouth 2 (two) times daily.    Dispense:  60 tablet    Refill:  5    BRAND NAME ONLY per insurance    Penni Bombard, MD [id number], [id number] AM Certified in Neurology, Neurophysiology and Neuroimaging  [facility] [address], [place], [place] [address] [phone]

## 2020-09-15 DIAGNOSIS — J45909 Unspecified asthma, uncomplicated: Secondary | ICD-10-CM | POA: Diagnosis not present

## 2020-09-15 DIAGNOSIS — J45901 Unspecified asthma with (acute) exacerbation: Secondary | ICD-10-CM | POA: Diagnosis not present

## 2020-09-15 DIAGNOSIS — G4739 Other sleep apnea: Secondary | ICD-10-CM | POA: Diagnosis not present

## 2020-09-15 DIAGNOSIS — R2689 Other abnormalities of gait and mobility: Secondary | ICD-10-CM | POA: Diagnosis not present

## 2020-10-11 ENCOUNTER — Encounter: Payer: Self-pay | Admitting: Family Medicine

## 2020-10-11 ENCOUNTER — Ambulatory Visit: Payer: Medicaid Other | Admitting: Family Medicine

## 2020-10-11 NOTE — Progress Notes (Deleted)
No chief complaint on file.    HISTORY OF PRESENT ILLNESS:  10/11/20 ALL:  Jasmine [name] is a 30 y.o. female here today for follow up for seizures and migraines. She continues levetiracetam 1500mg  BID. Vimpat was increased to 100mg  BID following visit with Jasmine [name] 12/2019 as she reported frequent seizures (4-6 per month). Topiramate was decreased to 50mg  BID due to anorexia. Rizatriptan continued for migraine abortion.     HISTORY (copied from Jasmine [name] previous note)  UPDATE (01/11/20, VRP): Since last visit, continues with frequent seizures (4-6 per month). No alleviating or aggravating factors. Tolerating meds. Multiple ER visits. More weight loss, fatigue, stress.    PRIOR HPI: 30 year old female here for evaluation of seizures.   Patient had onset of seizures around 30 years old, with no particular warning, generalized convulsions.  Sometimes she would have confusion spells where she would walk around dazed, unresponsive and unaware.  Patient was initially on Tegretol and then Keppra, then back to Tegretol, then back to Keppra.  She was started on topiramate to help with seizures and migraine.   Patient was living in [place], managed by neurologist there.  In the past year she is moved to [place].  She lives with 3 daughters ages 15 to 27 years old.  Her husband travels back and forth between [place] and [place] for work.  Patient has had multiple seizures since that time.  Most of the seizures have occurred in the setting of missed medications, running out of medications, stress factors.    REVIEW OF SYSTEMS: Out of a complete 14 system review of symptoms, the patient complains only of the following symptoms, and all other reviewed systems are negative.   ALLERGIES: Allergies  Allergen Reactions   Cheese Shortness Of Breath   Chocolate Shortness Of Breath   Ibuprofen Hives and Shortness Of Breath    Children's ibuprofen   Ivp Dye [Iodinated  Diagnostic Agents] Hives, Shortness Of Breath and Itching   Latex Anaphylaxis, Swelling and Other (See Comments)    Reaction:  Localized swelling    Orange Juice [Orange Oil] Shortness Of Breath   Other Hives and Other (See Comments)    Pt states that she is allergic to all steroids except IV solu-medrol.     Peach [Prunus Persica] Anaphylaxis   Peanuts [Peanut Oil] Anaphylaxis   Pear Anaphylaxis   Prednisone Hives   Raspberry Anaphylaxis   Tylenol [Acetaminophen] Hives, Shortness Of Breath and Other (See Comments)    Pt states that this is only with the liquid form   Amoxicillin Hives and Other (See Comments)    Has patient had a PCN reaction causing immediate rash, facial/tongue/throat swelling, SOB or lightheadedness with hypotension: No Has patient had a PCN reaction causing severe rash involving mucus membranes or skin necrosis: No Has patient had a PCN reaction that required hospitalization: No Has patient had a PCN reaction occurring within the last 10 years: No If all of the above answers are "NO", then may proceed with Cephalosporin use.   Apricot Flavor Hives   Doxycycline Hives   Erythromycin Hives   Milk Of Magnesia [Magnesium Hydroxide] Hives and Itching   Penicillins Hives and Other (See Comments)    Has patient had a PCN reaction causing immediate rash, facial/tongue/throat swelling, SOB or lightheadedness with hypotension: No Has patient had a PCN reaction causing severe rash involving mucus membranes or skin necrosis: No Has patient had a PCN reaction that required hospitalization: No Has  patient had a PCN reaction occurring within the last 10 years: No If all of the above answers are "NO", then may proceed with Cephalosporin use.     HOME MEDICATIONS: Outpatient Medications Prior to Visit  Medication Sig Dispense Refill   albuterol (VENTOLIN HFA) 108 (90 Base) MCG/ACT inhaler INHALE 2 PUFFS INTO THE LUNGS EVERY 4 HOURS AS NEEDED FOR WHEEZING OR SHORTNESS OF BREATH  18 g 1   Cholecalciferol (VITAMIN D3) 50 MCG (2000 UT) capsule Take 1 capsule (2,000 Units total) by mouth daily. 90 capsule 1   ferrous sulfate 325 (65 FE) MG tablet Take 1 tablet (325 mg total) by mouth daily with breakfast. 90 tablet 3   ipratropium (ATROVENT HFA) 17 MCG/ACT inhaler Inhale 2 puffs into the lungs every 6 (six) hours as needed for wheezing. 1 each 12   levETIRAcetam (KEPPRA) 750 MG tablet Take 1 tablet (750 mg total) by mouth 2 (two) times daily. 60 tablet 1   levETIRAcetam (KEPPRA) 750 MG tablet Take 2 tablets (1,500 mg total) by mouth 2 (two) times daily. 360 tablet 4   montelukast (SINGULAIR) 10 MG tablet Take 1 tablet (10 mg total) by mouth at bedtime. 30 tablet 3   potassium chloride SA (KLOR-CON) 20 MEQ tablet Take 20 mEq by mouth 2 (two) times daily.     rizatriptan (MAXALT-MLT) 10 MG disintegrating tablet Take 1 tablet (10 mg total) by mouth as needed for migraine. May repeat in 2 hours if needed 9 tablet 11   topiramate (TOPAMAX) 50 MG tablet Take 1 tablet (50 mg total) by mouth 2 (two) times daily. 60 tablet 1   topiramate (TOPAMAX) 50 MG tablet Take 1 tablet (50 mg total) by mouth 2 (two) times daily. 60 tablet 12   VIMPAT 100 MG TABS Take 1 tablet (100 mg total) by mouth 2 (two) times daily. 60 tablet 5   No facility-administered medications prior to visit.     PAST MEDICAL HISTORY: Past Medical History:  Diagnosis Date   Asthma    Cancer ([place])    ovarian   Complication of anesthesia    "I wake up during anesthesia" (10/14/2015)   DVT (deep vein thrombosis) in pregnancy    Epilepsy ([place])    hx of seizures since childhood, most recent 01/07/20   GERD (gastroesophageal reflux disease)    Heart murmur    last work up age 5- no symtoms   Pancreatitis    Seizures ([place])    Sickle cell trait ([place])    Sleep apnea    "used to wear CPAP; don't have one here in [place] since I moved in 2014" (10/14/2015)     PAST SURGICAL HISTORY: Past Surgical History:   Procedure Laterality Date   APPENDECTOMY     CESAREAN SECTION  2013   CESAREAN SECTION N/A 11/08/2014   Procedure: CESAREAN SECTION;  Surgeon: Jasmine Mainland, DO;  Location: [facility];  Service: Obstetrics;  Laterality: N/A;   CHOLECYSTECTOMY N/A 10/16/2015   Procedure: LAPAROSCOPIC CHOLECYSTECTOMY WITH POSSIBLE INTRAOPERATIVE CHOLANGIOGRAM;  Surgeon: Donnie Mesa, MD;  Location: [facility];  Service: General;  Laterality: N/A;   CLOSED REDUCTION MANDIBLE WITH MANDIBULOMA N/A 07/17/2017   Procedure: REDUCTION TMJ WITH INTRAMAXILLARY FIXATION;  Surgeon: Diona Browner, DDS;  Location: [facility];  Service: Oral Surgery;  Laterality: N/A;   ESOPHAGOGASTRODUODENOSCOPY (EGD) WITH PROPOFOL N/A 11/15/2016   Procedure: ESOPHAGOGASTRODUODENOSCOPY (EGD) WITH PROPOFOL;  Surgeon: Clarene Essex, MD;  Location: [facility];  Service: Endoscopy;  Laterality: N/A;  INGUINAL HERNIA REPAIR Bilateral ~ 1996   NERVE, TENDON AND ARTERY REPAIR Right 09/23/2012   Procedure: I&D and Repair As Necessary/Right Hand and Palm;  Surgeon: Roseanne Kaufman, MD;  Location: [facility];  Service: Orthopedics;  Laterality: Right;   OMENTECTOMY N/A 12/25/2018   Procedure: OMENTECTOMY;  Surgeon: Lafonda Mosses, MD;  Location: [facility];  Service: Gynecology;  Laterality: N/A;   SALPINGOOPHORECTOMY N/A 12/25/2018   Procedure: OPEN UNILATERAL  SALPINGO OOPHORECTOMY WITH STAGING;  Surgeon: Lafonda Mosses, MD;  Location: [facility];  Service: Gynecology;  Laterality: N/A;   TONSILLECTOMY       FAMILY HISTORY: Family History  Problem Relation Age of Onset   Cancer Mother        cervical cancer   Asthma Mother    Hypertension Father    Sickle cell anemia Father    Asthma Sister    Diabetes Maternal Aunt    Lymphoma Maternal Grandmother      SOCIAL HISTORY: Social History   Socioeconomic History   Marital status: Married    Spouse name: Not on file   Number of children: 3   Years of education: Not on file   Highest education level:  High school graduate  Occupational History   Not on file  Tobacco Use   Smoking status: Never   Smokeless tobacco: Never  Vaping Use   Vaping Use: Never used  Substance and Sexual Activity   Alcohol use: Not Currently   Drug use: Not Currently    Comment: pos urine drug screens   Sexual activity: Yes    Birth control/protection: None  Other Topics Concern   Not on file  Social History Narrative   ** Merged History Encounter **       Lives with family   Social Determinants of Health   Financial Resource Strain: Not on file  Food Insecurity: Not on file  Transportation Needs: Not on file  Physical Activity: Not on file  Stress: Not on file  Social Connections: Not on file  Intimate Partner Violence: Not on file     PHYSICAL EXAM  There were no vitals filed for this visit. There is no height or weight on file to calculate BMI.  Generalized: Well developed, in no acute distress  Cardiology: normal rate and rhythm, no murmur auscultated  Respiratory: clear to auscultation bilaterally    Neurological examination  Mentation: Alert oriented to time, place, history taking. Follows all commands speech and language fluent Cranial nerve II-XII: Pupils were equal round reactive to light. Extraocular movements were full, visual field were full on confrontational test. Facial sensation and strength were normal. Uvula tongue midline. Head turning and shoulder shrug  were normal and symmetric. Motor: The motor testing reveals 5 over 5 strength of all 4 extremities. Good symmetric motor tone is noted throughout.  Sensory: Sensory testing is intact to soft touch on all 4 extremities. No evidence of extinction is noted.  Coordination: Cerebellar testing reveals good finger-nose-finger and heel-to-shin bilaterally.  Gait and station: Gait is normal. Tandem gait is normal. Romberg is negative. No drift is seen.  Reflexes: Deep tendon reflexes are symmetric and normal bilaterally.     DIAGNOSTIC DATA (LABS, IMAGING, TESTING) - I reviewed patient records, labs, notes, testing and imaging myself where available.  Lab Results  Component Value Date   WBC 11.7 (H) 02/24/2020   HGB 12.9 04/07/2020   HCT 38.0 04/07/2020   MCV 70.8 (L) 02/24/2020   PLT 383 02/24/2020  Component Value Date/Time   NA 140 04/07/2020 1530   NA 141 08/20/2017 1144   K 4.1 04/07/2020 1530   CL 111 04/07/2020 1530   CO2 24 02/24/2020 1406   GLUCOSE 82 04/07/2020 1530   BUN 11 04/07/2020 1530   BUN 10 08/20/2017 1144   CREATININE 0.70 04/07/2020 1530   CREATININE 0.75 02/24/2020 1406   CALCIUM 8.9 02/24/2020 1406   PROT 7.1 02/24/2020 1406   PROT 7.1 08/20/2017 1144   ALBUMIN 3.9 02/24/2020 1406   ALBUMIN 4.4 08/20/2017 1144   AST 14 (L) 02/24/2020 1406   ALT 7 02/24/2020 1406   ALKPHOS 60 02/24/2020 1406   BILITOT 0.4 02/24/2020 1406   GFRNONAA >60 02/24/2020 1406   GFRAA >60 07/29/2019 1936   Lab Results  Component Value Date   TRIG 213 (H) 04/22/2015   No results found for: HGBA1C Lab Results  Component Value Date   VITAMINB12 168 (L) 02/24/2020   Lab Results  Component Value Date   TSH 3.930 08/20/2017    No flowsheet data found.   No flowsheet data found.   ASSESSMENT AND PLAN  30 y.o. year old female  has a past medical history of Asthma, Cancer ([place]), Complication of anesthesia, DVT (deep vein thrombosis) in pregnancy, Epilepsy ([place]), GERD (gastroesophageal reflux disease), Heart murmur, Pancreatitis, Seizures ([place]), Sickle cell trait ([place]), and Sleep apnea. here with    No diagnosis found.   No orders of the defined types were placed in this encounter.    No orders of the defined types were placed in this encounter.     Debbora Presto, MSN, FNP-C 10/11/2020, 12:50 PM  [facility] [address] [place], [place] [address] [phone]

## 2020-10-15 DIAGNOSIS — J45909 Unspecified asthma, uncomplicated: Secondary | ICD-10-CM | POA: Diagnosis not present

## 2020-10-15 DIAGNOSIS — G4739 Other sleep apnea: Secondary | ICD-10-CM | POA: Diagnosis not present

## 2020-10-15 DIAGNOSIS — R2689 Other abnormalities of gait and mobility: Secondary | ICD-10-CM | POA: Diagnosis not present

## 2020-10-15 DIAGNOSIS — J45901 Unspecified asthma with (acute) exacerbation: Secondary | ICD-10-CM | POA: Diagnosis not present

## 2020-11-01 ENCOUNTER — Encounter: Payer: Self-pay | Admitting: Neurology

## 2020-11-01 ENCOUNTER — Telehealth (INDEPENDENT_AMBULATORY_CARE_PROVIDER_SITE_OTHER): Payer: Medicaid Other | Admitting: Family Medicine

## 2020-11-01 ENCOUNTER — Encounter: Payer: Self-pay | Admitting: Family Medicine

## 2020-11-01 ENCOUNTER — Telehealth: Payer: Self-pay | Admitting: *Deleted

## 2020-11-01 DIAGNOSIS — G40909 Epilepsy, unspecified, not intractable, without status epilepticus: Secondary | ICD-10-CM

## 2020-11-01 NOTE — Patient Instructions (Signed)
Below is our plan:  We will have you evaluated by the ER in [place] today. Please go straight to the ER. Continue levetiracetam, Vimpat and topiramate as prescribed for now and until further evaluation by ER. I will send an urgent referral request for neurology in your area.   Please make sure you are staying well hydrated. I recommend 50-60 ounces daily. Well balanced diet and regular exercise encouraged. Consistent sleep schedule with 6-8 hours recommended.   Please continue follow up with care team as directed.   Follow up with me pending ER evaluation.   You may receive a survey regarding today's visit. I encourage you to leave honest feed back as I do use this information to improve patient care. Thank you for seeing me today!

## 2020-11-01 NOTE — Progress Notes (Signed)
PATIENT: Jasmine [name] DOB: [birth year]  REASON FOR VISIT: follow up HISTORY FROM: patient  Virtual Visit via Telephone Note  I connected with Izabell Sterbenz on 11/01/20 at  3:00 PM EDT by telephone and verified that I am speaking with the correct person using two identifiers.   I discussed the limitations, risks, security and privacy concerns of performing an evaluation and management service by telephone and the availability of in person appointments. I also discussed with the patient that there may be a patient responsible charge related to this service. The patient expressed understanding and agreed to proceed.   History of Present Illness:  11/01/20 ALL: Beckey [name] is a 30 y.o. female here today for follow up for seizures. She was last seen 01/11/2020 by Dr Leta Baptist. Levetiracetam continued at 1500mg  BID, lacosamide increased to 100mg  BID and topiramate reduced to 50mg  BID due to anorexia. She reports that she continues to have regular seizure activity. At least 2 times weekly. She had a seizure last night. She fell and hit her head and bit her lip. She has significant headache and mouth pain today. She has not been evaluated. She denies missed doses of medication. She is living in [place] with her grandmother, husband and three children. She does not drive. She denies missed doses of AEDs.   UPDATE (01/11/20, VRP): Since last visit, continues with frequent seizures (4-6 per month). No alleviating or aggravating factors. Tolerating meds. Multiple ER visits. More weight loss, fatigue, stress.    PRIOR HPI: 30 year old female here for evaluation of seizures.   Patient had onset of seizures around 30 years old, with no particular warning, generalized convulsions.  Sometimes she would have confusion spells where she would walk around dazed, unresponsive and unaware.  Patient was initially on Tegretol and then Keppra, then back to Tegretol, then back to Keppra.  She was started on  topiramate to help with seizures and migraine.   Patient was living in [place], managed by neurologist there.  In the past year she is moved to [place].  She lives with 3 daughters ages 63 to 58 years old.  Her husband travels back and forth between [place] and [place] for work.  Patient has had multiple seizures since that time.  Most of the seizures have occurred in the setting of missed medications, running out of medications, stress factors.   Observations/Objective:  Generalized: thin, in no acute distress  Mentation: Groggy, oriented to time, place, history taking. Follows all commands speech and language fluent, guarding of left side of mouth, edema noted via video but I am unable to visualize the inside of her mouth. Moving all four extremities fluidly.    Assessment and Plan:  30 y.o. year old female  has a past medical history of Asthma, Cancer ([place]), Complication of anesthesia, DVT (deep vein thrombosis) in pregnancy, Epilepsy ([place]), GERD (gastroesophageal reflux disease), Heart murmur, Pancreatitis, Seizures ([place]), Sickle cell trait ([place]), and Sleep apnea. here with    ICD-10-CM   1. Seizure disorder ([place])  G40.909 Ambulatory referral to Neurology     Jasmine [name] reports continued seizure activity at least twice weekly. She had a GTC seizure last night with fall and head, mouth injury. She complains of significant headache and having trouble moving left side of her mouth due to pain. Due to nature of video visit and limited examination, I have advised that she be seen byt the ER in [place] for formal evaluation and labs. May need adjustment of AED  pending workup. I am going to place an urgent referral to neurology in [place] as she does not know when she will be able to get back to [place] for follow up. She does not drive. She is alone, today but able to restate instructions and recommendations to me appropriately. I have attempted to contact her husband via telephone with no  answer. I will have staff call back before the end of the day. Seizure precautions reviewed. She reports having enough medication for now. Will refill pending ER evaluation.   Orders Placed This Encounter  Procedures   Ambulatory referral to Neurology    Referral Priority:   Urgent    Referral Type:   Consultation    Referral Reason:   Specialty Services Required    Requested Specialty:   Neurology    Number of Visits Requested:   1     No orders of the defined types were placed in this encounter.    Follow Up Instructions:  I discussed the assessment and treatment plan with the patient. The patient was provided an opportunity to ask questions and all were answered. The patient agreed with the plan and demonstrated an understanding of the instructions.   The patient was advised to call back or seek an in-person evaluation if the symptoms worsen or if the condition fails to improve as anticipated.  I provided 30 minutes of non-face-to-face time during this encounter. Patient located at their place of residence during [place] visit. Provider is in the office.    Debbora Presto, NP

## 2020-11-01 NOTE — Telephone Encounter (Signed)
Received this message from AL,NP: "Jasmine [name] is on my schedule for follow up seizures this afternoon. I was reading her notes and sounds like she continues to have seizures. We will need labs. I can see her via mychart but she will have to come in for labs. Can we let her know? May be easier to do in office visit if able. She was in [place] but not sure she is back in [place]."  I called pt at [phone], LVM for her to call office prior to mychart VV at 3pm today.  Tried [phone]. Received automated message that mailbox invalid, unable to leave message.

## 2020-11-03 NOTE — Telephone Encounter (Signed)
Referral sent to [facility] in [place], [place]. Phone: [phone].

## 2020-11-15 DIAGNOSIS — G4739 Other sleep apnea: Secondary | ICD-10-CM | POA: Diagnosis not present

## 2020-11-15 DIAGNOSIS — J45909 Unspecified asthma, uncomplicated: Secondary | ICD-10-CM | POA: Diagnosis not present

## 2020-11-15 DIAGNOSIS — R2689 Other abnormalities of gait and mobility: Secondary | ICD-10-CM | POA: Diagnosis not present

## 2020-11-15 DIAGNOSIS — J45901 Unspecified asthma with (acute) exacerbation: Secondary | ICD-10-CM | POA: Diagnosis not present

## 2020-12-03 ENCOUNTER — Other Ambulatory Visit (INDEPENDENT_AMBULATORY_CARE_PROVIDER_SITE_OTHER): Payer: Self-pay | Admitting: Primary Care

## 2020-12-03 DIAGNOSIS — D509 Iron deficiency anemia, unspecified: Secondary | ICD-10-CM

## 2020-12-03 DIAGNOSIS — R0602 Shortness of breath: Secondary | ICD-10-CM

## 2020-12-03 NOTE — Telephone Encounter (Signed)
Requested Prescriptions  Pending Prescriptions Disp Refills  . montelukast (SINGULAIR) 10 MG tablet [Pharmacy Med Name: MONTELUKAST 10MG  TABLETS] 90 tablet 0    Sig: TAKE 1 TABLET(10 MG) BY MOUTH AT BEDTIME     Pulmonology:  Leukotriene Inhibitors Passed - 12/03/2020  6:37 AM      Passed - Valid encounter within last 12 months    Recent Outpatient Visits          9 months ago Abnormal blood finding   [place] Kerin Perna, NP   1 year ago Hospital discharge follow-up   [place], [place], NP   2 years ago Nonintractable headache, unspecified chronicity pattern, unspecified headache type   [place] Gildardo Pounds, NP   3 years ago Seizures ([place])   [place], Roger David, PA-C   5 years ago Calculus of gallbladder without cholecystitis without obstruction   [place], [place] T, MD             Signed Prescriptions Disp Refills   FEROSUL 325 (65 Fe) MG tablet 90 tablet 0    Sig: TAKE 1 TABLET(325 MG) BY MOUTH DAILY WITH BREAKFAST     Endocrinology:  Minerals - Iron Supplementation Failed - 12/03/2020  6:37 AM      Failed - Fe (serum) in normal range and within 360 days    Iron  Date Value Ref Range Status  02/24/2020 16 (L) 41 - 142 ug/dL Final   Saturation Ratios  Date Value Ref Range Status  02/24/2020 4 (L) 21 - 57 % Final         Failed - Ferritin in normal range and within 360 days    Ferritin  Date Value Ref Range Status  02/24/2020 9 (L) 11 - 307 ng/mL Final    Comment:    Performed at [facility], [place] [address], [place], [place] [address]         Passed - HGB in normal range and within 360 days    Hemoglobin  Date Value Ref Range Status  04/07/2020 12.9 12.0 - 15.0 g/dL Final  12/22/2018 9.9 (L) 12.0 - 15.0 g/dL Final    Comment:    Reticulocyte Hemoglobin  testing may be clinically indicated, consider ordering this additional test [id number]   08/20/2017 11.9 11.1 - 15.9 g/dL Final   Hemoglobin Other  Date Value Ref Range Status  05/13/2014 0.0 0.0 % Final    Comment:      Interpretation -------------- Normal study.   Reviewed by Odis Hollingshead, MD, PhD, FCAP (Electronic Signature on File)          Passed - HCT in normal range and within 360 days    HCT  Date Value Ref Range Status  04/07/2020 38.0 36.0 - 46.0 % Final   Hematocrit  Date Value Ref Range Status  08/20/2017 38.0 34.0 - 46.6 % Final         Passed - RBC in normal range and within 360 days    RBC  Date Value Ref Range Status  02/24/2020 4.38 3.87 - 5.11 MIL/uL Final         Passed - Valid encounter within last 12 months    Recent Outpatient Visits          9 months ago Abnormal blood finding   [place],  Milford Cage, NP   1 year ago Hospital discharge follow-up   [place] Kerin Perna, NP   2 years ago Nonintractable headache, unspecified chronicity pattern, unspecified headache type   [place] Gildardo Pounds, NP   3 years ago [facility]izures [facility]   [place] Clent Demark, PA-C   5 years ago Calculus of gallbladder without cholecystitis without obstruction   [facility] [place], Leda Quail, MD

## 2020-12-03 NOTE — Telephone Encounter (Signed)
Requested Prescriptions  Pending Prescriptions Disp Refills  . montelukast (SINGULAIR) 10 MG tablet [Pharmacy Med Name: MONTELUKAST 10MG  TABLETS] 30 tablet 3    Sig: TAKE 1 TABLET(10 MG) BY MOUTH AT BEDTIME     Pulmonology:  Leukotriene Inhibitors Passed - 12/03/2020  6:37 AM      Passed - Valid encounter within last 12 months    Recent Outpatient Visits          9 months ago Abnormal blood finding   [place] Kerin Perna, NP   1 year ago Hospital discharge follow-up   [place], [place], NP   2 years ago Nonintractable headache, unspecified chronicity pattern, unspecified headache type   [place] Gildardo Pounds, NP   3 years ago Seizures ([place])   [place], Roger David, PA-C   5 years ago Calculus of gallbladder without cholecystitis without obstruction   [place], [place] T, MD             . FEROSUL 325 (65 Fe) MG tablet [Pharmacy Med Name: FERROUS SULFATE 325MG  (5GR) TABS] 90 tablet 0    Sig: TAKE 1 TABLET(325 MG) BY MOUTH DAILY WITH BREAKFAST     Endocrinology:  Minerals - Iron Supplementation Failed - 12/03/2020  6:37 AM      Failed - Fe (serum) in normal range and within 360 days    Iron  Date Value Ref Range Status  02/24/2020 16 (L) 41 - 142 ug/dL Final   Saturation Ratios  Date Value Ref Range Status  02/24/2020 4 (L) 21 - 57 % Final         Failed - Ferritin in normal range and within 360 days    Ferritin  Date Value Ref Range Status  02/24/2020 9 (L) 11 - 307 ng/mL Final    Comment:    Performed at [facility], [place] [address], [place], [place] [employer] [address]         Passed - HGB in normal range and within 360 days    Hemoglobin  Date Value Ref Range Status  04/07/2020 12.9 12.0 - 15.0 g/dL Final  12/22/2018 9.9 (L) 12.0 - 15.0 g/dL Final    Comment:    Reticulocyte  Hemoglobin testing may be clinically indicated, consider ordering this additional test [id number]   08/20/2017 11.9 11.1 - 15.9 g/dL Final   Hemoglobin Other  Date Value Ref Range Status  05/13/2014 0.0 0.0 % Final    Comment:      Interpretation -------------- Normal study.   Reviewed by Odis Hollingshead, MD, PhD, FCAP (Electronic Signature on File)          Passed - HCT in normal range and within 360 days    HCT  Date Value Ref Range Status  04/07/2020 38.0 36.0 - 46.0 % Final   Hematocrit  Date Value Ref Range Status  08/20/2017 38.0 34.0 - 46.6 % Final         Passed - RBC in normal range and within 360 days    RBC  Date Value Ref Range Status  02/24/2020 4.38 3.87 - 5.11 MIL/uL Final         Passed - Valid encounter within last 12 months    Recent Outpatient Visits          9 months ago Abnormal blood finding   [facility] Kerin Perna, NP   1 year ago Hospital discharge follow-up   [place], [place], NP   2 years ago Nonintractable headache, unspecified chronicity pattern, unspecified headache type   [place] Gildardo Pounds, NP   3 years ago Seizures [facility])   [place] Clent Demark, PA-C   5 years ago Calculus of gallbladder without cholecystitis without obstruction   [facility], Leda Quail, MD

## 2020-12-15 DIAGNOSIS — J45901 Unspecified asthma with (acute) exacerbation: Secondary | ICD-10-CM | POA: Diagnosis not present

## 2020-12-15 DIAGNOSIS — G4739 Other sleep apnea: Secondary | ICD-10-CM | POA: Diagnosis not present

## 2020-12-15 DIAGNOSIS — R2689 Other abnormalities of gait and mobility: Secondary | ICD-10-CM | POA: Diagnosis not present

## 2020-12-15 DIAGNOSIS — J45909 Unspecified asthma, uncomplicated: Secondary | ICD-10-CM | POA: Diagnosis not present

## 2021-01-15 DIAGNOSIS — J45909 Unspecified asthma, uncomplicated: Secondary | ICD-10-CM | POA: Diagnosis not present

## 2021-01-15 DIAGNOSIS — J45901 Unspecified asthma with (acute) exacerbation: Secondary | ICD-10-CM | POA: Diagnosis not present

## 2021-01-15 DIAGNOSIS — R2689 Other abnormalities of gait and mobility: Secondary | ICD-10-CM | POA: Diagnosis not present

## 2021-01-15 DIAGNOSIS — G4739 Other sleep apnea: Secondary | ICD-10-CM | POA: Diagnosis not present

## 2021-02-15 DIAGNOSIS — G4739 Other sleep apnea: Secondary | ICD-10-CM | POA: Diagnosis not present

## 2021-02-15 DIAGNOSIS — J45909 Unspecified asthma, uncomplicated: Secondary | ICD-10-CM | POA: Diagnosis not present

## 2021-02-15 DIAGNOSIS — J45901 Unspecified asthma with (acute) exacerbation: Secondary | ICD-10-CM | POA: Diagnosis not present

## 2021-02-15 DIAGNOSIS — R2689 Other abnormalities of gait and mobility: Secondary | ICD-10-CM | POA: Diagnosis not present

## 2021-03-15 DIAGNOSIS — R2689 Other abnormalities of gait and mobility: Secondary | ICD-10-CM | POA: Diagnosis not present

## 2021-03-15 DIAGNOSIS — G4739 Other sleep apnea: Secondary | ICD-10-CM | POA: Diagnosis not present

## 2021-03-15 DIAGNOSIS — J45901 Unspecified asthma with (acute) exacerbation: Secondary | ICD-10-CM | POA: Diagnosis not present

## 2021-03-15 DIAGNOSIS — J45909 Unspecified asthma, uncomplicated: Secondary | ICD-10-CM | POA: Diagnosis not present

## 2021-04-02 IMAGING — CT CT ABD-PELV W/O CM
2 of 4 series · 15 of 46 positions shown, 17 images · non-contrast
Comparison: 07/09/2017

CLINICAL DATA: Right lower quadrant pain, distention

EXAM:
CT ABDOMEN AND PELVIS WITHOUT CONTRAST
TECHNIQUE: Multidetector CT imaging of the abdomen and pelvis was performed
following the standard protocol without IV contrast.

[Series 3: axial st · axial · 0.76mm/px · z∈[-506,-61]mm · 12 of 101 slices shown, 14 images]
[im 6/101  soft-tissue]
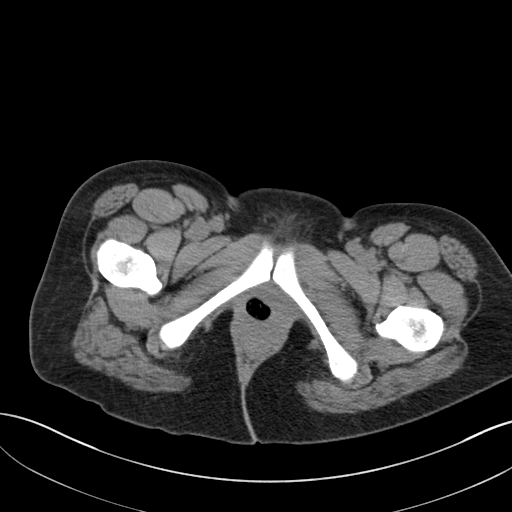
[im 6/101  bone]
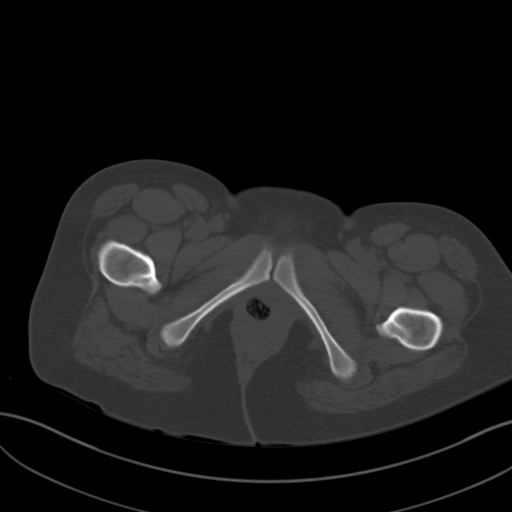
[im 16/101  soft-tissue]
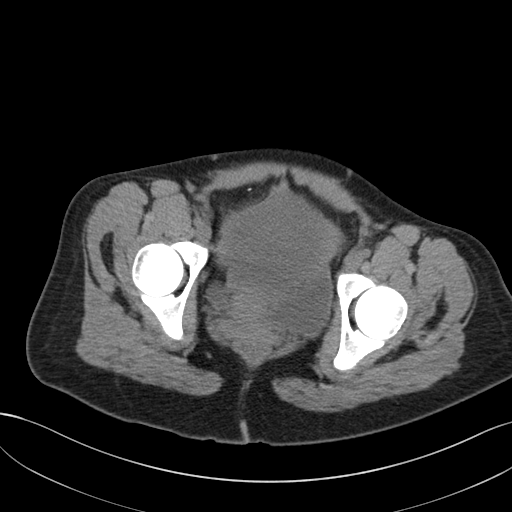
[im 22/101  soft-tissue]
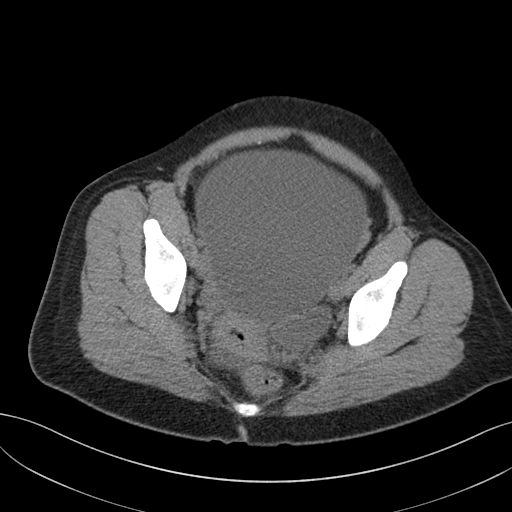
[im 32/101  soft-tissue]
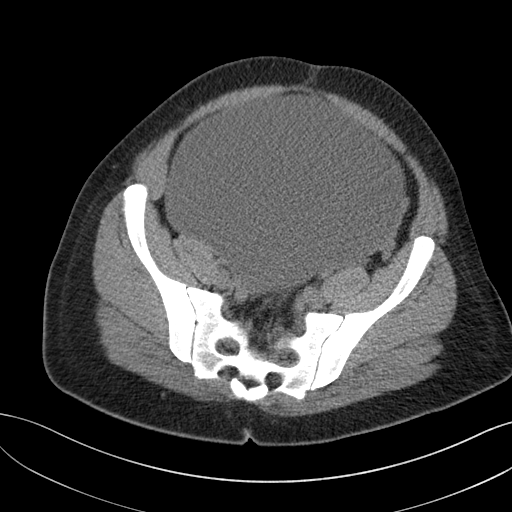
[im 37/101  soft-tissue]
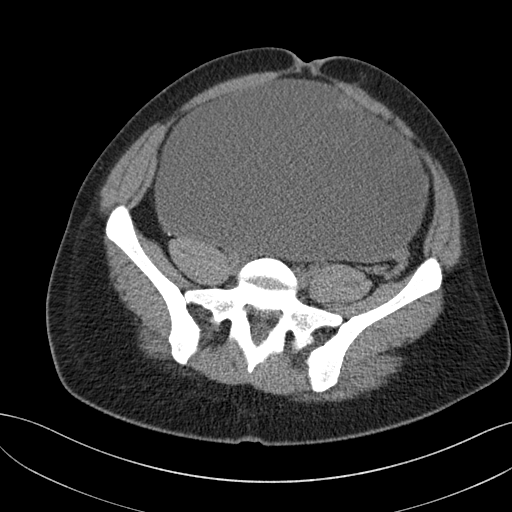
[im 48/101  soft-tissue]
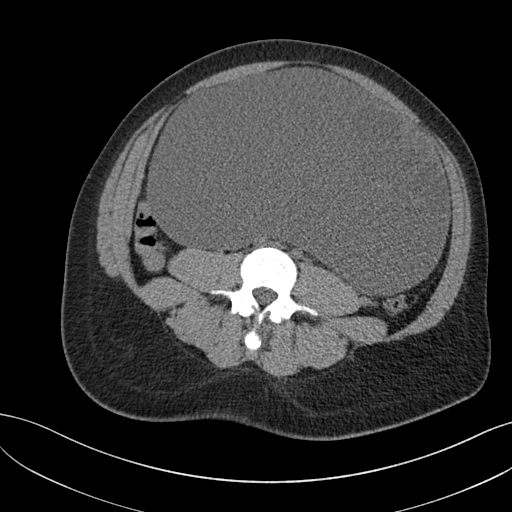
[im 53/101  soft-tissue]
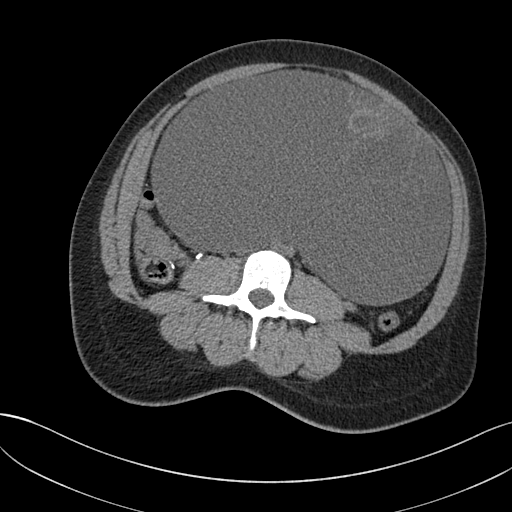
[im 64/101  soft-tissue]
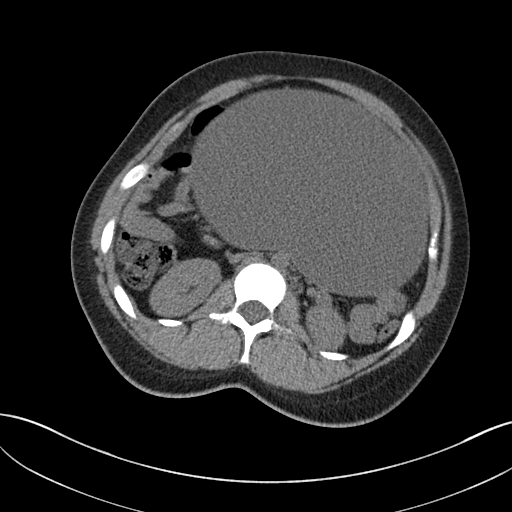
[im 69/101  soft-tissue]
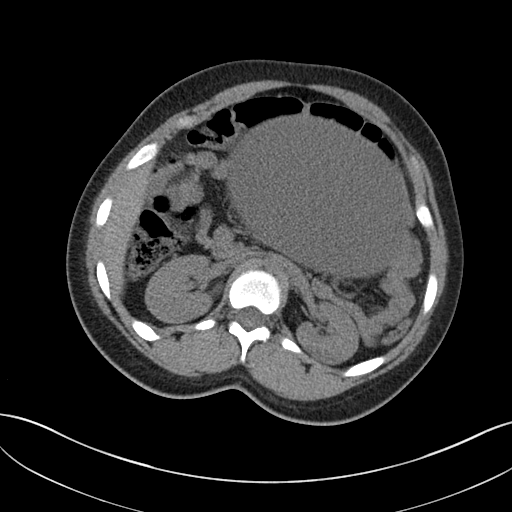
[im 69/101  bone]
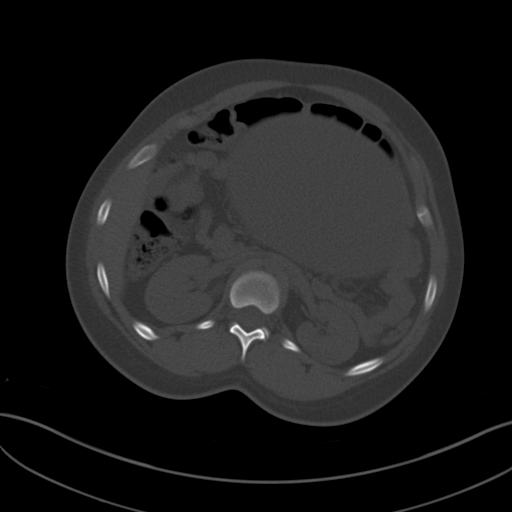
[im 79/101  soft-tissue]
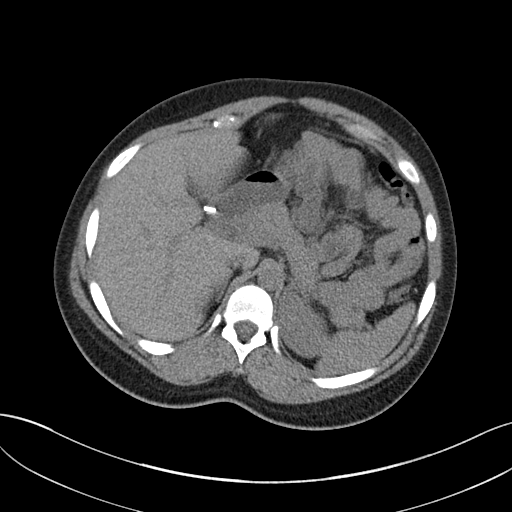
[im 85/101  soft-tissue]
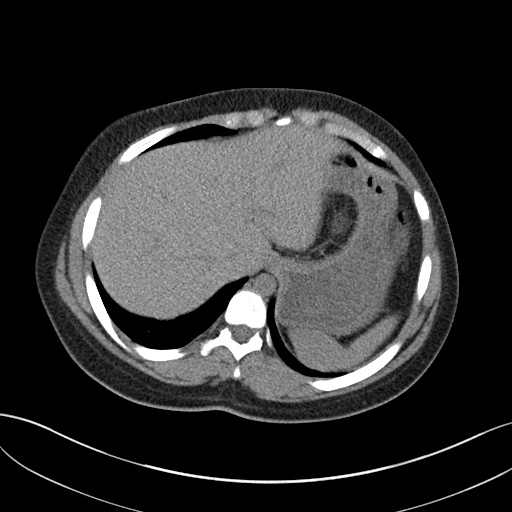
[im 95/101  soft-tissue]
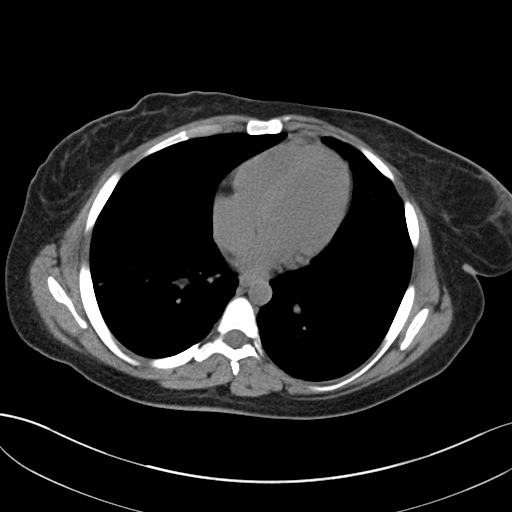

[Series 5: coronal st · coronal · 0.70mm/px · 3 of 161 slices shown]
[im 54/161  soft-tissue]
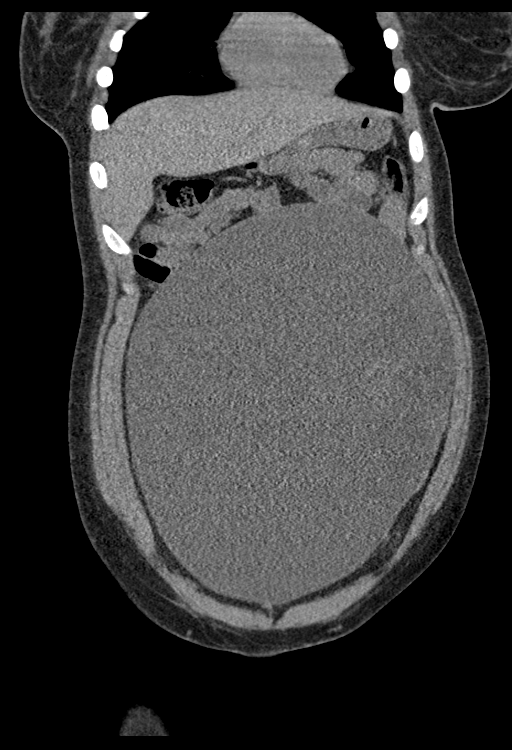
[im 72/161  soft-tissue]
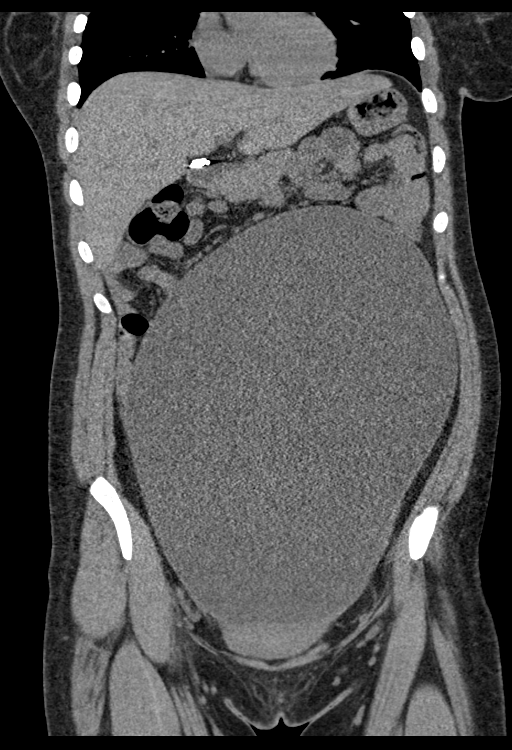
[im 89/161  soft-tissue]
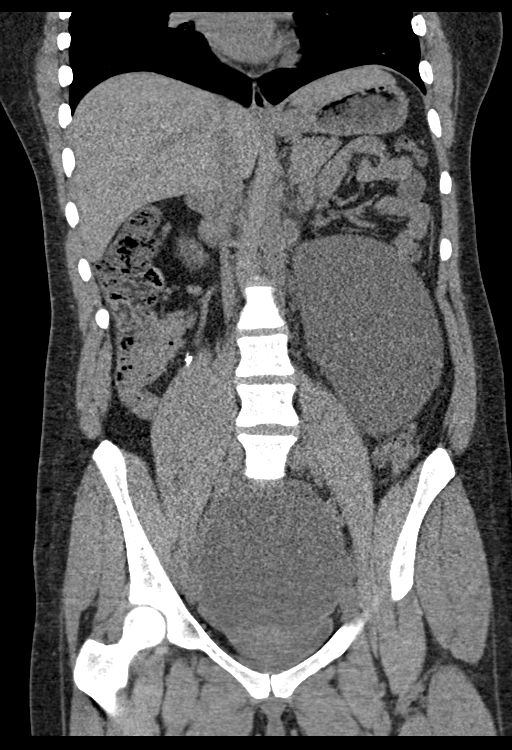

[15 of 46 positions shown; findings below may reference images not displayed]

FINDINGS: Lower chest: Areas of bibasilar subsegmental atelectasis. No
effusions. Heart is normal size.

Hepatobiliary: No focal liver abnormality is seen. Status post
cholecystectomy. No biliary dilatation.

Pancreas: No focal abnormality or ductal dilatation.

Spleen: No focal abnormality.  Normal size.

Adrenals/Urinary Tract: No adrenal abnormality. No focal renal
abnormality. No stones or hydronephrosis. Urinary bladder is
unremarkable.

Stomach/Bowel: Stomach, large and small bowel grossly unremarkable.

Vascular/Lymphatic: No evidence of aneurysm or adenopathy.

Reproductive: Very large cystic mass extends from the pelvis into
the upper abdomen. There are bilateral adnexal cystic masses on
prior CT, but this is new and much larger. This mass measures 30 x
22 x 14 cm. There are soft tissue components with nodularity and
septations in the left anterior portion of this large mass.
Additional cystic mass noted in the pelvis/left adnexa measuring up
to 5.3 cm. Uterus unremarkable.

Other: Trace free fluid in the pelvis.  No free air.

Musculoskeletal: No acute bony abnormality.
IMPRESSION: 30 cm complex cystic mass arising from the pelvis and extending into
the upper abdomen. This is either new or represents marked
enlargement of 1 of the previously seen adnexal cystic masses on
prior CT. This is most compatible with cystic ovarian neoplasm.
Recommend gynecologic consultation.

Additional 5.3 cm cystic mass in the left adnexa.

These results were called by telephone at the time of interpretation
on 12/04/2018 at [DATE] to provider HIEU AMBROSE , who verbally
acknowledged these results.

## 2021-04-15 DIAGNOSIS — G4739 Other sleep apnea: Secondary | ICD-10-CM | POA: Diagnosis not present

## 2021-04-15 DIAGNOSIS — J45901 Unspecified asthma with (acute) exacerbation: Secondary | ICD-10-CM | POA: Diagnosis not present

## 2021-04-15 DIAGNOSIS — R2689 Other abnormalities of gait and mobility: Secondary | ICD-10-CM | POA: Diagnosis not present

## 2021-04-15 DIAGNOSIS — J45909 Unspecified asthma, uncomplicated: Secondary | ICD-10-CM | POA: Diagnosis not present

## 2021-06-01 ENCOUNTER — Ambulatory Visit: Payer: Medicaid Other | Admitting: Diagnostic Neuroimaging

## 2021-06-06 DIAGNOSIS — J45901 Unspecified asthma with (acute) exacerbation: Secondary | ICD-10-CM | POA: Diagnosis not present

## 2021-06-06 DIAGNOSIS — J45909 Unspecified asthma, uncomplicated: Secondary | ICD-10-CM | POA: Diagnosis not present

## 2021-06-06 DIAGNOSIS — G4739 Other sleep apnea: Secondary | ICD-10-CM | POA: Diagnosis not present

## 2021-06-06 DIAGNOSIS — R2689 Other abnormalities of gait and mobility: Secondary | ICD-10-CM | POA: Diagnosis not present

## 2021-06-15 DIAGNOSIS — R2689 Other abnormalities of gait and mobility: Secondary | ICD-10-CM | POA: Diagnosis not present

## 2021-06-15 DIAGNOSIS — J45901 Unspecified asthma with (acute) exacerbation: Secondary | ICD-10-CM | POA: Diagnosis not present

## 2021-06-15 DIAGNOSIS — J45909 Unspecified asthma, uncomplicated: Secondary | ICD-10-CM | POA: Diagnosis not present

## 2021-06-15 DIAGNOSIS — G4739 Other sleep apnea: Secondary | ICD-10-CM | POA: Diagnosis not present

## 2021-07-15 DIAGNOSIS — G4739 Other sleep apnea: Secondary | ICD-10-CM | POA: Diagnosis not present

## 2021-07-15 DIAGNOSIS — J45909 Unspecified asthma, uncomplicated: Secondary | ICD-10-CM | POA: Diagnosis not present

## 2021-07-15 DIAGNOSIS — R2689 Other abnormalities of gait and mobility: Secondary | ICD-10-CM | POA: Diagnosis not present

## 2021-07-15 DIAGNOSIS — J45901 Unspecified asthma with (acute) exacerbation: Secondary | ICD-10-CM | POA: Diagnosis not present

## 2021-07-24 ENCOUNTER — Telehealth: Payer: Self-pay | Admitting: *Deleted

## 2021-07-24 NOTE — Patient Outreach (Signed)
  Care Coordination [facility] Note Transition Care Management Unsuccessful Follow-up Telephone Call  Date of discharge and from where:  07/22/21, [name]  Attempts:  1st Attempt  Reason for unsuccessful TCM follow-up call:  No answer/busy  Lurena Joiner RN, [place] RN Care Coordinator

## 2021-07-25 ENCOUNTER — Telehealth: Payer: Self-pay | Admitting: *Deleted

## 2021-07-25 NOTE — Patient Outreach (Signed)
  Care Coordination [facility] Note Transition Care Management Unsuccessful Follow-up Telephone Call  Date of discharge and from where:  07/22/21, [name]  Attempts:  2nd Attempt  Reason for unsuccessful TCM follow-up call:  No answer/busy  Lurena Joiner RN, [place] RN Care Coordinator

## 2021-08-03 ENCOUNTER — Telehealth: Payer: Self-pay | Admitting: *Deleted

## 2021-08-03 NOTE — Patient Outreach (Signed)
Care Coordination  08/03/2021  Jasmine [name] [birth year] [id number]  Transition Care Management Unsuccessful Follow-up Telephone Call  Date of discharge and from where:  07/22/21, [name]  Attempts:  3rd Attempt  Reason for unsuccessful TCM follow-up call:  No answer/busy  Lurena Joiner RN, [place] RN Care Coordinator

## 2021-08-15 DIAGNOSIS — G4739 Other sleep apnea: Secondary | ICD-10-CM | POA: Diagnosis not present

## 2021-08-15 DIAGNOSIS — J45901 Unspecified asthma with (acute) exacerbation: Secondary | ICD-10-CM | POA: Diagnosis not present

## 2021-08-15 DIAGNOSIS — R2689 Other abnormalities of gait and mobility: Secondary | ICD-10-CM | POA: Diagnosis not present

## 2021-08-15 DIAGNOSIS — J45909 Unspecified asthma, uncomplicated: Secondary | ICD-10-CM | POA: Diagnosis not present

## 2021-09-15 DIAGNOSIS — G4739 Other sleep apnea: Secondary | ICD-10-CM | POA: Diagnosis not present

## 2021-09-15 DIAGNOSIS — J45909 Unspecified asthma, uncomplicated: Secondary | ICD-10-CM | POA: Diagnosis not present

## 2021-09-15 DIAGNOSIS — J45901 Unspecified asthma with (acute) exacerbation: Secondary | ICD-10-CM | POA: Diagnosis not present

## 2021-09-15 DIAGNOSIS — R2689 Other abnormalities of gait and mobility: Secondary | ICD-10-CM | POA: Diagnosis not present

## 2021-10-15 DIAGNOSIS — R2689 Other abnormalities of gait and mobility: Secondary | ICD-10-CM | POA: Diagnosis not present

## 2021-10-15 DIAGNOSIS — G4739 Other sleep apnea: Secondary | ICD-10-CM | POA: Diagnosis not present

## 2021-10-15 DIAGNOSIS — J45901 Unspecified asthma with (acute) exacerbation: Secondary | ICD-10-CM | POA: Diagnosis not present

## 2021-10-15 DIAGNOSIS — J45909 Unspecified asthma, uncomplicated: Secondary | ICD-10-CM | POA: Diagnosis not present

## 2021-11-15 DIAGNOSIS — R2689 Other abnormalities of gait and mobility: Secondary | ICD-10-CM | POA: Diagnosis not present

## 2021-11-15 DIAGNOSIS — J45901 Unspecified asthma with (acute) exacerbation: Secondary | ICD-10-CM | POA: Diagnosis not present

## 2021-11-15 DIAGNOSIS — G4739 Other sleep apnea: Secondary | ICD-10-CM | POA: Diagnosis not present

## 2021-11-15 DIAGNOSIS — J45909 Unspecified asthma, uncomplicated: Secondary | ICD-10-CM | POA: Diagnosis not present

## 2021-12-15 DIAGNOSIS — J45909 Unspecified asthma, uncomplicated: Secondary | ICD-10-CM | POA: Diagnosis not present

## 2021-12-15 DIAGNOSIS — J45901 Unspecified asthma with (acute) exacerbation: Secondary | ICD-10-CM | POA: Diagnosis not present

## 2021-12-15 DIAGNOSIS — G4739 Other sleep apnea: Secondary | ICD-10-CM | POA: Diagnosis not present

## 2021-12-15 DIAGNOSIS — R2689 Other abnormalities of gait and mobility: Secondary | ICD-10-CM | POA: Diagnosis not present

## 2023-02-16 DIAGNOSIS — J45901 Unspecified asthma with (acute) exacerbation: Secondary | ICD-10-CM | POA: Diagnosis not present

## 2023-02-16 DIAGNOSIS — G4739 Other sleep apnea: Secondary | ICD-10-CM | POA: Diagnosis not present

## 2023-02-16 DIAGNOSIS — J45909 Unspecified asthma, uncomplicated: Secondary | ICD-10-CM | POA: Diagnosis not present

## 2023-02-16 DIAGNOSIS — R2689 Other abnormalities of gait and mobility: Secondary | ICD-10-CM | POA: Diagnosis not present

## 2023-10-10 ENCOUNTER — Telehealth: Payer: Self-pay | Admitting: *Deleted

## 2023-10-10 NOTE — Telephone Encounter (Signed)
 Letter from [employer] placed in this nurse chair.  Requesting medical records fr Ottis Maus, D.O.B.:  [birth year] by October 08, 2023.  This nurse faxed  request to [facility]) HIM [phone]).

## 2023-10-23 NOTE — Telephone Encounter (Signed)
 [place]  Medical illustrator request for Medical records.  Faxed to ([facility]) H.I.[facility]. [phone]).
